# Patient Record
Sex: Male | Born: 1950 | Race: White | Hispanic: No | Smoking: Former smoker
Health system: Southern US, Community
[De-identification: ages and names within clinical notes are randomized; demographics above are authoritative.]

## PROBLEM LIST (undated history)

## (undated) DIAGNOSIS — I10 Essential (primary) hypertension: Secondary | ICD-10-CM

## (undated) DIAGNOSIS — I350 Nonrheumatic aortic (valve) stenosis: Secondary | ICD-10-CM

## (undated) DIAGNOSIS — J449 Chronic obstructive pulmonary disease, unspecified: Secondary | ICD-10-CM

## (undated) DIAGNOSIS — I25810 Atherosclerosis of coronary artery bypass graft(s) without angina pectoris: Secondary | ICD-10-CM

## (undated) DIAGNOSIS — F1010 Alcohol abuse, uncomplicated: Secondary | ICD-10-CM

## (undated) DIAGNOSIS — Z720 Tobacco use: Secondary | ICD-10-CM

## (undated) DIAGNOSIS — J90 Pleural effusion, not elsewhere classified: Secondary | ICD-10-CM

## (undated) DIAGNOSIS — E785 Hyperlipidemia, unspecified: Secondary | ICD-10-CM

## (undated) DIAGNOSIS — I4819 Other persistent atrial fibrillation: Secondary | ICD-10-CM

## (undated) DIAGNOSIS — Z462 Encounter for fitting and adjustment of other devices related to nervous system and special senses: Secondary | ICD-10-CM

## (undated) HISTORY — DX: Nonrheumatic aortic (valve) stenosis: I35.0

## (undated) HISTORY — PX: OTHER SURGICAL HISTORY: SHX169

## (undated) HISTORY — DX: Pleural effusion, not elsewhere classified: J90

## (undated) HISTORY — DX: Other persistent atrial fibrillation: I48.19

## (undated) HISTORY — DX: Chronic obstructive pulmonary disease, unspecified: J44.9

## (undated) HISTORY — DX: Atherosclerosis of coronary artery bypass graft(s) without angina pectoris: I25.810

## (undated) HISTORY — DX: Encounter for fitting and adjustment of other devices related to nervous system and special senses: Z46.2

---

## 2002-08-26 ENCOUNTER — Encounter: Payer: Self-pay | Admitting: Emergency Medicine

## 2002-08-26 ENCOUNTER — Emergency Department (HOSPITAL_COMMUNITY): Admission: EM | Admit: 2002-08-26 | Discharge: 2002-08-26 | Payer: Self-pay | Admitting: Emergency Medicine

## 2004-07-23 ENCOUNTER — Emergency Department (HOSPITAL_COMMUNITY): Admission: EM | Admit: 2004-07-23 | Discharge: 2004-07-23 | Payer: Self-pay | Admitting: Emergency Medicine

## 2009-02-02 ENCOUNTER — Encounter: Admission: RE | Admit: 2009-02-02 | Discharge: 2009-02-02 | Payer: Self-pay | Admitting: Family Medicine

## 2009-02-10 ENCOUNTER — Ambulatory Visit (HOSPITAL_COMMUNITY): Admission: RE | Admit: 2009-02-10 | Discharge: 2009-02-10 | Payer: Self-pay | Admitting: Neurosurgery

## 2009-05-08 ENCOUNTER — Encounter: Admission: RE | Admit: 2009-05-08 | Discharge: 2009-05-08 | Payer: Self-pay | Admitting: Neurosurgery

## 2009-05-28 ENCOUNTER — Observation Stay (HOSPITAL_COMMUNITY): Admission: EM | Admit: 2009-05-28 | Discharge: 2009-05-29 | Payer: Self-pay | Admitting: Emergency Medicine

## 2011-02-13 LAB — HEPATIC FUNCTION PANEL
Alkaline Phosphatase: 71 U/L (ref 39–117)
Indirect Bilirubin: 0.8 mg/dL (ref 0.3–0.9)
Total Protein: 6.4 g/dL (ref 6.0–8.3)

## 2011-02-13 LAB — RAPID URINE DRUG SCREEN, HOSP PERFORMED
Amphetamines: NOT DETECTED
Barbiturates: NOT DETECTED
Opiates: NOT DETECTED

## 2011-02-13 LAB — DIFFERENTIAL
Eosinophils Relative: 1 % (ref 0–5)
Lymphocytes Relative: 33 % (ref 12–46)
Monocytes Absolute: 0.7 10*3/uL (ref 0.1–1.0)
Monocytes Relative: 9 % (ref 3–12)
Neutro Abs: 4.5 10*3/uL (ref 1.7–7.7)

## 2011-02-13 LAB — CBC
HCT: 40.2 % (ref 39.0–52.0)
Hemoglobin: 13.9 g/dL (ref 13.0–17.0)
Hemoglobin: 14.1 g/dL (ref 13.0–17.0)
MCHC: 34.1 g/dL (ref 30.0–36.0)
MCV: 99.4 fL (ref 78.0–100.0)
RBC: 4.09 MIL/uL — ABNORMAL LOW (ref 4.22–5.81)
RDW: 13.3 % (ref 11.5–15.5)

## 2011-02-13 LAB — PROTIME-INR: INR: 1 (ref 0.00–1.49)

## 2011-02-13 LAB — LIPID PANEL
Cholesterol: 198 mg/dL (ref 0–200)
HDL: 40 mg/dL (ref >39)
Triglycerides: 87 mg/dL (ref <150)

## 2011-02-13 LAB — CARDIAC PANEL(CRET KIN+CKTOT+MB+TROPI)
CK, MB: 5.3 ng/mL — ABNORMAL HIGH (ref 0.3–4.0)
Relative Index: 2.2 (ref 0.0–2.5)
Relative Index: 2.3 (ref 0.0–2.5)
Total CK: 217 U/L (ref 7–232)
Total CK: 228 U/L (ref 7–232)
Troponin I: 0.04 ng/mL (ref 0.00–0.06)

## 2011-02-13 LAB — POCT CARDIAC MARKERS
CKMB, poc: 2.3 ng/mL (ref 1.0–8.0)
CKMB, poc: 3.5 ng/mL (ref 1.0–8.0)
Myoglobin, poc: 113 ng/mL (ref 12–200)
Myoglobin, poc: 61.1 ng/mL (ref 12–200)
Myoglobin, poc: 67.2 ng/mL (ref 12–200)
Troponin i, poc: <0.05 ng/mL (ref 0.00–0.09)

## 2011-02-13 LAB — BASIC METABOLIC PANEL
CO2: 27 mEq/L (ref 19–32)
Calcium: 9 mg/dL (ref 8.4–10.5)
GFR calc Af Amer: >60 mL/min (ref >60)
GFR calc non Af Amer: >60 mL/min (ref >60)
Glucose, Bld: 101 mg/dL — ABNORMAL HIGH (ref 70–99)
Potassium: 4 mEq/L (ref 3.5–5.1)
Sodium: 137 mEq/L (ref 135–145)

## 2011-02-13 LAB — HEMOGLOBIN A1C
Hgb A1c MFr Bld: 5.7 % (ref 4.6–6.1)
Mean Plasma Glucose: 117 mg/dL

## 2011-02-13 LAB — APTT: aPTT: 35 seconds (ref 24–37)

## 2011-02-13 LAB — TSH: TSH: 0.986 u[IU]/mL (ref 0.350–4.500)

## 2011-02-16 LAB — CBC
MCHC: 34.2 g/dL (ref 30.0–36.0)
MCV: 98.7 fL (ref 78.0–100.0)
Platelets: 221 10*3/uL (ref 150–400)
RBC: 4.99 MIL/uL (ref 4.22–5.81)
RDW: 13.4 % (ref 11.5–15.5)

## 2011-03-22 NOTE — Op Note (Signed)
NAMELUCHIANO, VISCOMI             ACCOUNT NO.:  [id number]   MEDICAL RECORD NO.:  [id number]          PATIENT TYPE:  OIB   LOCATION:  3524                         FACILITY:  [facility]   PHYSICIAN:  Hilda Lias, M.D.   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   POSTOPERATIVE DIAGNOSIS:  Left L4-5 herniated disk with fragment.   PROCEDURE:  Left L4-5 diskectomy, decompression of the L5 nerve root and  the thecal sac, foraminotomy.  Microscope.   SURGEON:  Hilda Lias, MD   CLINICAL HISTORY:  The patient was admitted because of back and left leg  pain.  The patient has quite a bit of weakness of the left foot.  He has  failed conservative treatment.  MRI showed a large herniated disk going  to the body of L5.  Surgery was advised.   PROCEDURE:  The patient was taken to the OR and after intubation he was  positioned in a prone manner.  The skin was cleaned with DuraPrep.  The  patient was really ischemia and we were able to feel and see the spinous  process.  We made an incision from L4-5 on the left side and retraction  was done.  X-rays showed that indeed we were at the level of the L4-5.  With the drill, we drilled the lower lamina of L5 and the upper of L4.  Then, the yellow ligament was also excised.  With the microscope, we  visualized the thecal sac and indeed there was a large herniated disk  with free fragment going into the body of L5.  Removal of 3 foramen was  done.  There was an opening in the disk and total gross diskectomy on  mediolateral was achieved.  At the end, we had good decompression of the  L5 and S1 nerve root.  Investigation showed no more evidence of any  fragment.  Valsalva maneuver was negative.  Then, fentanyl and Depo-  Medrol were left in the epidural space and the wound was closed  with  Vicryl and Steri-Strips.           ______________________________  Hilda Lias, M.D.     EB/MEDQ  D:  02/10/2009  T:  02/11/2009  Job:  [id number]

## 2011-03-22 NOTE — Discharge Summary (Signed)
Craig [name], Craig [name]             ACCOUNT NO.:  [id number]   MEDICAL RECORD NO.:  [id number]          PATIENT TYPE:  OBV   LOCATION:  4736                         FACILITY:  [facility]   PHYSICIAN:  Charlestine Massed, MDDATE OF BIRTH:  11/23/1950   DATE OF ADMISSION:  05/27/2009  DATE OF DISCHARGE:  05/29/2009                               DISCHARGE SUMMARY   PRIMARY CARE PHYSICIAN:  Tammy R. Collins Scotland, M.D. from [facility] at  [place].   CARDIOLOGIST:  [facility].   REASON FOR ADMISSION:  Chest pain with EKG changes referred by primary  care physician.   DISCHARGE DIAGNOSES:  1. Chest pain possibly noncardiac origin - stress test negative for      reversible ischemia.  2. History of heavy tobacco abuse, currently off.  3. History of alcohol abuse, currently off.  4. He has dyslipidemia.  5. Elevated blood pressure - hypertension.   DISCHARGE MEDICATIONS:  1. Lisinopril 5 mg p.o. daily.  2. Aspirin 81 mg p.o. daily.  3. Zocor 20 mg p.o. at bedtime.   HOSPITAL COURSE BY PROBLEM:  1. Chest pain, rule out near chest pain - Craig [name] is a 60-      year-old gentleman who was referred by his PMD, Dr. Herb Grays,      for complaints of chest pain and he had some EKG changes.  He was      basically referred to get a stress test and evaluation done here.      The patient was admitted, and cyclic cardiac enzymes were rounded,      and total of 3  troponin sets were run, and they were negative for      acute myocardial infarction.  EKG showed very minimal lateral wall      changes as well as coving changes.  The EKG revealed a normal sinus      rhythm with a nonspecific ST-T wave changes in the lateral leads      and T-wave inversions in the inferior leads.  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  His  exercise tolerance has been extremely      good all this time.   The patient has a strong history of smoking, and he is not on any  medication at this time.  His blood pressure was found to be slightly  elevated at the time of admission.   The patient underwent Lexiscan stress test with a nuclear imaging and  has been negative for any reversible inducible ischemia and so the  patient has been cleared to go back home and reported his primary care  physician.   The patient has been started on medication for his hypertension at this  time and to follow up with his PMD for that.  His lipids, LDL has been  elevated and he has been started on Zocor also.  1. Hypertension started on  lisinopril 5 mg p.o. daily.  Follow up with      his PMD for adjustment of medication.  2. Dyslipidemia, Zocor 20 mg p.o. at bedtime.   DISPOSITION:  Discharged back home.   FOLLOWUP:  Follow up with Dr. Herb Grays as an outpatient in a weeks  time for adjustment of blood pressure medications and also for checking  liver function tests after 2-3 weeks of Zocor.   DISCHARGE INSTRUCTIONS:  1. Adhere with medications.  2. Stop smoking and no further alcohol intake.  3. To keep appointments regularly.   A total of 40 minutes was spent on the discharge.      Charlestine Massed, MD  Electronically Signed     UT/MEDQ  D:  05/29/2009  T:  05/30/2009  Job:  [id number]   cc:   Tammy R. Collins Scotland, M.D.

## 2011-03-22 NOTE — H&P (Signed)
[name], [name]             ACCOUNT NO.:  [id number]   MEDICAL RECORD NO.:  [id number]           PATIENT TYPE:   LOCATION:                                 FACILITY:   PHYSICIAN:  Craig Crater, MD         DATE OF BIRTH:  1951/09/24   DATE OF ADMISSION:  05/28/2009  DATE OF DISCHARGE:                              HISTORY & PHYSICAL   PRIMARY CARE PHYSICIAN:  None.   CHIEF COMPLAINT:  Chest pain, shortness of breath.   HISTORY OF PRESENT ILLNESS:  Craig [name] is a 60 year old male with no  significant past medical history.  He has been experiencing chest pain  and shortness of breath which has been going on for the past several  months.  This is approximately of 1 year in duration.  He says that  since the hot weather has begun he has become more short of breath.  There is a decrease in exercise capacity.  The chest pain is located on  the left side of the chest, no radiation.  Five or 10 in intensity.  No  specific aggravating or alleviating factors.  It is associated with  shortness of breath.  No nausea or vomiting.  No cough.  The pain is not  pleuritic.  It does not change with position.  There is no history of  PND or orthopnea.  He had a stress test done 10-12 years ago and is  reportedly normal.  He has a longstanding history of smoking.  He used  to smoke 3 packs per day but note he cut back to 15 cigarettes a day.  The electrocardiogram obtained in the ER was actually different from the  one obtained in April.  There was T-wave inversion in leads II, III, aVF  and V4, V5, V6.  He is currently chest pain free.  The reason he came to  the emergency room was he wanted to get evaluated and so he went to the  [facility] earlier today and they did the electrocardiogram which was  abnormal and so he was sent to the emergency room.   REVIEW OF SYSTEMS:  A complete review of systems was done which included  general, head, eyes, ears, nose, throat, cardiovascular,  respiratory,  GI, GU, endocrine, musculoskeletal, skin, neurological and psychiatric  all are within normal limits other than what is mentioned above.   PAST MEDICAL HISTORY:  Herniated disk, status post surgery in April  2010.   ALLERGIES:  None.   CURRENT MEDICATIONS AT HOME:  None.   SOCIAL HISTORY:  There is a history of tobacco abuse which was heavy in  the past, now he smokes 15 cigarettes per day.  Close to 60-pack-years  of smoking.  Remote history of heavy alcohol abuse, he quit.  No history  of drug use.  He works with heavy machinery.   FAMILY HISTORY:  Noncontributory.   PHYSICAL EXAMINATION:  T max 98.  Blood pressure 119/77 to 150/94.  Pulse rate 58 to 85.  Respirations 18.  Oxygen saturation is 100% on  room air.  GENERAL  APPEARANCE:  Not in acute distress, alert, awake and oriented  x3, afebrile.  HEENT:  Normocephalic, atraumatic, the pupils are equal and reactive to  accommodation, extraocular movements are intact.  The mucous membranes  are moist.  NECK:  Supple, no JVD, lymphadenopathy or carotid bruit.  CVS:  Regular in rhythm, rate is normal, no murmurs, rubs or gallops.  LUNGS:  Clear to auscultation bilaterally.  EXTREMITIES:  No clubbing, cyanosis or edema.  NEUROLOGICAL:  Grossly nonfocal.   LABS/STUDIES:  Basic metabolic profile:  Sodium 137, potassium 4.0,  chloride 106, bicarbonate 27, BUN 12, creatinine 1.03, glucose 101.  Myoglobin 67.2, CK-MB 2.6, troponin less than 0.05.  Electrocardiogram:  Normal sinus rhythm at the rate of 80 beats per minute, axis is normal.  PR interval and QT intervals are within normal limits.  Normal QRS  morphology.  T-wave inversions in lead II, lead III, aVF.  T-wave  inversions in V4, V5, V6.  No ST changes.   ASSESSMENT/PLAN:  1. Chest pain:  Will evaluate for cardiac ischemia given his extensive      smoking history.  He does have new changes in the      electrocardiogram.  Will start him on advanced cardiac  life support      protocol.  Will schedule him for a stress test later in the day.      If the stress test is normal, he will be discharged home.  2. Tobacco abuse:  Extensive counseling was done regarding the      importance of his quitting tobacco.  The patient verbalized      understanding this.  3. Deep vein thrombosis:  Prophylaxis with Lovenox.  4. Fluids, electrolytes and nutrition:  He will be made nothing by      mouth.  He will be on normal saline.  Will replace electrolytes as      needed.  5. Disposition:  The patient will be scheduled for a stress test later      in the day.      Craig Crater, MD  Electronically Signed     TA/MEDQ  D:  05/28/2009  T:  05/28/2009  Job:  [id number]

## 2015-02-17 ENCOUNTER — Encounter: Payer: Self-pay | Admitting: Diagnostic Neuroimaging

## 2015-02-17 ENCOUNTER — Ambulatory Visit (INDEPENDENT_AMBULATORY_CARE_PROVIDER_SITE_OTHER): Payer: 59 | Admitting: Diagnostic Neuroimaging

## 2015-02-17 VITALS — BP 130/89 | HR 86 | Ht 65.0 in | Wt 145.2 lb

## 2015-02-17 DIAGNOSIS — M545 Low back pain: Secondary | ICD-10-CM

## 2015-02-17 DIAGNOSIS — M549 Dorsalgia, unspecified: Secondary | ICD-10-CM | POA: Insufficient documentation

## 2015-02-17 DIAGNOSIS — J441 Chronic obstructive pulmonary disease with (acute) exacerbation: Secondary | ICD-10-CM | POA: Diagnosis not present

## 2015-02-17 DIAGNOSIS — M5126 Other intervertebral disc displacement, lumbar region: Secondary | ICD-10-CM | POA: Diagnosis not present

## 2015-02-17 DIAGNOSIS — M5416 Radiculopathy, lumbar region: Secondary | ICD-10-CM | POA: Diagnosis not present

## 2015-02-17 NOTE — Progress Notes (Signed)
[facility]  PATIENT: Craig [name] DOB: [birth year]  REFERRING CLINICIAN: [facility] HISTORY FROM: patient  REASON FOR VISIT: new consult    HISTORICAL  CHIEF COMPLAINT:  Chief Complaint  Patient presents with  . New Evaluation    HISTORY OF PRESENT ILLNESS:   64 year old male here for evaluation of low back pain. Symptoms of been present for several months. Increasing severity in the last 1 month. Patient referred for consultation by [facility] PA-c.  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 Patient has had intermittent low back pain symptoms radiating to the left leg since that time. He noticed some symptoms 2 years ago which were intermittent. Patient retired 1-2 months ago due to lung reasons. He has not been doing any heavy lifting. No injuries. Over the past 2-3 months he's noticed increasing pain, intermittently radiate into the left groin. Last one month he is noted more constant pain into his left groin, left testicle region. He's able to have an erection, but not able to maintain during sexual activity. This is a new problem since the past 4-6 weeks. No incontinence. No numbness in the left leg. No problems with the right leg. He feels some pain in the right lower back region which seems to radiate to the left side. No problems with his arms, neck or face.  He has tried ibuprofen and tramadol without relief. He does not like to take pain medication.   REVIEW OF SYSTEMS: Full 14 system review of systems performed and notable only for shortness of breath ringing in ears.  ALLERGIES: Not on File  HOME MEDICATIONS: No outpatient prescriptions prior to visit.   No facility-administered medications prior to visit.    PAST MEDICAL HISTORY: Past Medical History  Diagnosis Date  . Emphysema lung   . COPD (chronic obstructive pulmonary disease)     PAST SURGICAL  HISTORY: Past Surgical History  Procedure Laterality Date  . Ruptured disc repair  2009 or 2010    FAMILY HISTORY: Family History  Problem Relation Age of Onset  . Diabetes Father     SOCIAL HISTORY:  History   Social History  . Marital Status: Single    Spouse Name: N/A  . Number of Children: 3  . Years of Education: GED   Occupational History  . retired     Social History Main Topics  . Smoking status: Former Research scientist (life sciences)  . Smokeless tobacco: Never Used     Comment: quit 2010  . Alcohol Use: No     Comment: quit 2004  . Drug Use: No     Comment: quit 2015  . Sexual Activity: Not on file   Other Topics Concern  . Not on file   Social History Narrative   Lives with son   Drinks one cup of coffee a day and one soda q2-3 days     PHYSICAL EXAM  Filed Vitals:   02/17/15 1137  BP: 130/89  Pulse: 86  Height: '5\' 5"'  (1.651 m)  Weight: 145 lb 3.2 oz (65.862 kg)    Body mass index is 24.16 kg/(m^2).   Visual Acuity Screening   Right eye Left eye Both eyes  Without correction: 20/40 20/30   With correction:       No flowsheet data found.  GENERAL EXAM: Patient is in no distress; well developed, nourished and groomed; neck is supple  CARDIOVASCULAR: Regular rate and rhythm, no  murmurs, no carotid bruits  NEUROLOGIC: MENTAL STATUS: awake, alert, oriented to person, place and time, recent and remote memory intact, normal attention and concentration, language fluent, comprehension intact, naming intact, fund of knowledge appropriate CRANIAL NERVE: no papilledema on fundoscopic exam, pupils equal and reactive to light, visual fields full to confrontation, extraocular muscles intact, no nystagmus, facial sensation and strength symmetric, hearing intact, palate elevates symmetrically, uvula midline, shoulder shrug symmetric, tongue midline. MOTOR: normal bulk and tone, full strength in the BUE, BLE; LEFT KF AND KF TRIGGERES LOW BACK PAIN ON THE RIGHT SENSORY:  normal and symmetric to light touch, pinprick, temperature, vibration COORDINATION: finger-nose-finger, fine finger movements normal REFLEXES: deep tendon reflexes present and symmetric; TRACE AT ANKLES GAIT/STATION: narrow based gait; able to walk on toes, heels and tandem; romberg is negative    DIAGNOSTIC DATA (LABS, IMAGING, TESTING) - I reviewed patient records, labs, notes, testing and imaging myself where available.  Lab Results  Component Value Date   WBC 8.3 05/29/2009   HGB 14.1 05/29/2009   HCT 41.2 05/29/2009   MCV 99.4 05/29/2009   PLT 163 05/29/2009      Component Value Date/Time   NA 137 05/27/2009 2134   K 4.0 05/27/2009 2134   CL 106 05/27/2009 2134   CO2 27 05/27/2009 2134   GLUCOSE 101* 05/27/2009 2134   BUN 12 05/27/2009 2134   CREATININE 1.03 05/27/2009 2134   CALCIUM 9.0 05/27/2009 2134   PROT 6.4 05/28/2009 1425   ALBUMIN 3.7 05/28/2009 1425   AST 22 05/28/2009 1425   ALT 16 05/28/2009 1425   ALKPHOS 71 05/28/2009 1425   BILITOT 0.9 05/28/2009 1425   GFRNONAA >60 05/27/2009 2134   GFRAA  05/27/2009 2134    >60        The eGFR has been calculated using the MDRD equation. This calculation has not been validated in all clinical situations. eGFR's persistently <60 mL/min signify possible Chronic Kidney Disease.   Lab Results  Component Value Date   CHOL  05/28/2009    198        ATP III CLASSIFICATION:  <200     mg/dL   Desirable  200-239  mg/dL   Borderline High  >=240    mg/dL   High          HDL 40 05/28/2009   LDLCALC * 05/28/2009    141        Total Cholesterol/HDL:CHD Risk Coronary Heart Disease Risk Table                     Men   Women  1/2 Average Risk   3.4   3.3  Average Risk       5.0   4.4  2 X Average Risk   9.6   7.1  3 X Average Risk  23.4   11.0        Use the calculated Patient Ratio above and the CHD Risk Table to determine the patient's CHD Risk.        ATP III CLASSIFICATION (LDL):  <100     mg/dL    Optimal  100-129  mg/dL   Near or Above                    Optimal  130-159  mg/dL   Borderline  160-189  mg/dL   High  >190     mg/dL   Very High   TRIG 87 05/28/2009  CHOLHDL 5.0 05/28/2009   Lab Results  Component Value Date   HGBA1C  05/28/2009    5.7 (NOTE) The ADA recommends the following therapeutic goal for glycemic control related to Hgb A1c measurement: Goal of therapy: <6.5 Hgb A1c  Reference: American Diabetes Association: Clinical Practice Recommendations 2010, Diabetes Care, 2010, 33: (Suppl  1).   No results found for: VITAMINB12 Lab Results  Component Value Date   TSH 0.986 *Test methodology is 3rd generation TSH* 05/28/2009    02/03/09 MRI lumbar spine [I reviewed images myself and agree with interpretation. -VRP]  1. Left paracentral disc extrusion with caudal migration of a moderate-sized disc fragment at L4-L5. There is resulting left L5 nerve root compression. 2. Mild disc bulging and bilateral facet hypertrophy at L5-S1 without resulting spinal stenosis or nerve root encroachment. 3. No other significant disc space findings.    ASSESSMENT AND PLAN  64 y.o. year old male here with history of left L4-5 disc herniation, status post discectomy in 2010, now with recurrent low back pain radiating to left groin and testicle, with erectile dysfunction, pain, intermittent for past 2-3 months, more severe and constant in the past 4-6 weeks. Suspect recurrent disc herniation.  PLAN: - MRI lumbar spine - then PT eval vs neurosurgery evaluation  Orders Placed This Encounter  Procedures  . MR Lumbar Spine Wo Contrast   Return in about 6 weeks (around 03/31/2015).    Penni Bombard, MD 2/64/1583, 09:40 PM Certified in Neurology, Neurophysiology and Neuroimaging  [facility] [address], [place], [place] [address] [phone]

## 2015-02-19 ENCOUNTER — Other Ambulatory Visit: Payer: Self-pay | Admitting: Diagnostic Neuroimaging

## 2015-02-19 DIAGNOSIS — M5416 Radiculopathy, lumbar region: Secondary | ICD-10-CM

## 2015-02-25 ENCOUNTER — Ambulatory Visit (INDEPENDENT_AMBULATORY_CARE_PROVIDER_SITE_OTHER): Payer: 59

## 2015-02-25 DIAGNOSIS — M5416 Radiculopathy, lumbar region: Secondary | ICD-10-CM | POA: Diagnosis not present

## 2015-02-25 MED ORDER — GADOPENTETATE DIMEGLUMINE 469.01 MG/ML IV SOLN: 15.0000 mL | Freq: Once | INTRAVENOUS | Status: AC | PRN

## 2015-03-31 ENCOUNTER — Ambulatory Visit (INDEPENDENT_AMBULATORY_CARE_PROVIDER_SITE_OTHER): Payer: 59 | Admitting: Diagnostic Neuroimaging

## 2015-03-31 ENCOUNTER — Encounter: Payer: Self-pay | Admitting: Diagnostic Neuroimaging

## 2015-03-31 DIAGNOSIS — M5416 Radiculopathy, lumbar region: Secondary | ICD-10-CM

## 2015-03-31 NOTE — Progress Notes (Signed)
[facility]  PATIENT: Craig [name] DOB: May 24, 1951  REFERRING CLINICIAN: [place] HISTORY FROM: patient  REASON FOR VISIT: follow up    HISTORICAL  CHIEF COMPLAINT:  Chief Complaint  Patient presents with  . Follow-up    lumbar radiculopathy    HISTORY OF PRESENT ILLNESS:   UPDATE 03/31/15: Since last visit sxs are stable. MRI results reviewed. He is reluctant to pursue surgical eval of pain mgmt / meds. Interested in PT evaluation.  PRIOR HPI (02/17/15): 64 year old male here for evaluation of low back pain. Symptoms of been present for several months. Increasing severity in the last 1 month. Patient referred for consultation by [facility] PA-c. 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 Patient has had intermittent low back pain symptoms radiating to the left leg since that time. He noticed some symptoms 2 years ago which were intermittent. Patient retired 1-2 months ago due to lung reasons. He has not been doing any heavy lifting. No injuries. Over the past 2-3 months he's noticed increasing pain, intermittently radiate into the left groin. Last one month he is noted more constant pain into his left groin, left testicle region. He's able to have an erection, but not able to maintain during sexual activity. This is a new problem since the past 4-6 weeks. No incontinence. No numbness in the left leg. No problems with the right leg. He feels some pain in the right lower back region which seems to radiate to the left side. No problems with his arms, neck or face. He has tried ibuprofen and tramadol without relief. He does not like to take pain medication.   REVIEW OF SYSTEMS: Full 14 system review of systems performed and notable only for shortness of breath back pain.   ALLERGIES: Not on File  HOME MEDICATIONS: Outpatient Prescriptions Prior to Visit  Medication Sig  Dispense Refill  . traMADol (ULTRAM) 50 MG tablet Take 50 mg by mouth every 6 (six) hours.  3   No facility-administered medications prior to visit.    PAST MEDICAL HISTORY: Past Medical History  Diagnosis Date  . Emphysema lung   . COPD (chronic obstructive pulmonary disease)     PAST SURGICAL HISTORY: Past Surgical History  Procedure Laterality Date  . Ruptured disc repair  2009 or 2010    FAMILY HISTORY: Family History  Problem Relation Age of Onset  . Diabetes Father     SOCIAL HISTORY:  History   Social History  . Marital Status: Single    Spouse Name: N/A  . Number of Children: 3  . Years of Education: GED   Occupational History  . retired     Social History Main Topics  . Smoking status: Former Research scientist (life sciences)  . Smokeless tobacco: Never Used     Comment: quit 2010  . Alcohol Use: No     Comment: quit 2004  . Drug Use: No     Comment: quit 2015  . Sexual Activity: Not on file   Other Topics Concern  . Not on file   Social History Narrative   Lives with son   Drinks one cup of coffee a day and one soda q2-3 days     PHYSICAL EXAM  Filed Vitals:   03/31/15 1504  BP: 157/98  Pulse: 67  Height: _0  (1.651 m)  Weight: 147 lb 9.6 oz (66.951 kg)    Body mass index is 24.56 kg/(m^2).  No exam data present  No flowsheet data found.  GENERAL EXAM: Patient is in no distress; well developed, nourished and groomed; neck is supple  CARDIOVASCULAR: Regular rate and rhythm, no murmurs, no carotid bruits  NEUROLOGIC: MENTAL STATUS: awake, alert, language fluent, comprehension intact, naming intact, fund of knowledge appropriate CRANIAL NERVE: no papilledema on fundoscopic exam, pupils equal and reactive to light, visual fields full to confrontation, extraocular muscles intact, no nystagmus, facial sensation and strength symmetric, hearing intact, palate elevates symmetrically, uvula midline, shoulder shrug symmetric, tongue midline. MOTOR: normal bulk  and tone, full strength in the BUE, BLE SENSORY: normal and symmetric to light touch COORDINATION: finger-nose-finger, fine finger movements normal REFLEXES: deep tendon reflexes present and symmetric; TRACE AT ANKLES GAIT/STATION: narrow based gait; able to walk tandem; romberg is negative     DIAGNOSTIC DATA (LABS, IMAGING, TESTING) - I reviewed patient records, labs, notes, testing and imaging myself where available.  Lab Results  Component Value Date   WBC 8.3 05/29/2009   HGB 14.1 05/29/2009   HCT 41.2 05/29/2009   MCV 99.4 05/29/2009   PLT 163 05/29/2009      Component Value Date/Time   NA 137 05/27/2009 2134   K 4.0 05/27/2009 2134   CL 106 05/27/2009 2134   CO2 27 05/27/2009 2134   GLUCOSE 101* 05/27/2009 2134   BUN 12 05/27/2009 2134   CREATININE 1.03 05/27/2009 2134   CALCIUM 9.0 05/27/2009 2134   PROT 6.4 05/28/2009 1425   ALBUMIN 3.7 05/28/2009 1425   AST 22 05/28/2009 1425   ALT 16 05/28/2009 1425   ALKPHOS 71 05/28/2009 1425   BILITOT 0.9 05/28/2009 1425   GFRNONAA >60 05/27/2009 2134   GFRAA  05/27/2009 2134    >60        The eGFR has been calculated using the MDRD equation. This calculation has not been validated in all clinical situations. eGFR's persistently <60 mL/min signify possible Chronic Kidney Disease.   Lab Results  Component Value Date   CHOL  05/28/2009    198        ATP III CLASSIFICATION:  <200     mg/dL   Desirable  200-239  mg/dL   Borderline High  >=240    mg/dL   High          HDL 40 05/28/2009   LDLCALC * 05/28/2009    141        Total Cholesterol/HDL:CHD Risk Coronary Heart Disease Risk Table                     Men   Women  1/2 Average Risk   3.4   3.3  Average Risk       5.0   4.4  2 X Average Risk   9.6   7.1  3 X Average Risk  23.4   11.0        Use the calculated Patient Ratio above and the CHD Risk Table to determine the patient's CHD Risk.        ATP III CLASSIFICATION (LDL):  <100     mg/dL    Optimal  100-129  mg/dL   Near or Above                    Optimal  130-159  mg/dL   Borderline  160-189  mg/dL   High  >190     mg/dL   Very High   TRIG 87 05/28/2009  CHOLHDL 5.0 05/28/2009   Lab Results  Component Value Date   HGBA1C  05/28/2009    5.7 (NOTE) The ADA recommends the following therapeutic goal for glycemic control related to Hgb A1c measurement: Goal of therapy: <6.5 Hgb A1c  Reference: American Diabetes Association: Clinical Practice Recommendations 2010, Diabetes Care, 2010, 33: (Suppl  1).   No results found for: VITAMINB12 Lab Results  Component Value Date   TSH 0.986 *Test methodology is 3rd generation TSH* 05/28/2009    02/03/09 MRI lumbar spine [I reviewed images myself and agree with interpretation. -VRP]  1. Left paracentral disc extrusion with caudal migration of a moderate-sized disc fragment at L4-L5. There is resulting left L5 nerve root compression. 2. Mild disc bulging and bilateral facet hypertrophy at L5-S1 without resulting spinal stenosis or nerve root encroachment. 3. No other significant disc space findings.  02/25/15 MRI lumbar spine [I reviewed images myself and agree with interpretation. -VRP]  - showing a prior left L4-L5 laminectomy. Disc height ery reduced at this height and there is left greater than right foraminal narrowing with some encroachment upon the exiting left L4 nerve root. Additionally there is left lateral recess stenosis that encroaches upon the left L5 nerve root. That nerve root is slightly displaced posteriorly. After contrast, there is subtle enhancement of the residual disc posteriorly but no definite epidural fibrosis.     ASSESSMENT AND PLAN  64 y.o. year old male here with history of left L4-5 disc herniation, status post discectomy in 2010, now with recurrent low back pain radiating to left groin and testicle, with erectile dysfunction, pain, intermittent since Jan 2016. Could represent lumbar radiculopathy,  although repeat MRI does not demonstrate a specific lesion amenable to surgery. Will try conservative mgmt with PT evaluation. Also, he may need a urology eval for genital pain and erectile dysfunction.   PLAN: - PT eval  - return to PCP for possible urology consult  Orders Placed This Encounter  Procedures  . Ambulatory referral to Physical Therapy   Return in about 6 months (around 10/01/2015).    Penni Bombard, MD 5/97/4718, 5:50 PM Certified in Neurology, Neurophysiology and Neuroimaging  [facility] [address] [place], [place] [address] [phone]

## 2015-03-31 NOTE — Patient Instructions (Signed)
I will setup physical therapy.  Consider urology evaluation for genital pain and erectile dysfunction.

## 2015-10-07 ENCOUNTER — Ambulatory Visit: Payer: 59 | Admitting: Diagnostic Neuroimaging

## 2015-12-26 ENCOUNTER — Encounter (HOSPITAL_COMMUNITY): Payer: Self-pay | Admitting: *Deleted

## 2015-12-26 ENCOUNTER — Emergency Department (HOSPITAL_COMMUNITY): Payer: Medicaid Other

## 2015-12-26 ENCOUNTER — Inpatient Hospital Stay (HOSPITAL_COMMUNITY)
Admission: EM | Admit: 2015-12-26 | Discharge: 2016-02-03 | DRG: 216 | Disposition: A | Discharge status: Rehabilitation Facility | Payer: Medicaid Other | Attending: Surgery | Admitting: Surgery

## 2015-12-26 DIAGNOSIS — Z9689 Presence of other specified functional implants: Secondary | ICD-10-CM

## 2015-12-26 DIAGNOSIS — N189 Chronic kidney disease, unspecified: Secondary | ICD-10-CM | POA: Diagnosis present

## 2015-12-26 DIAGNOSIS — I471 Supraventricular tachycardia: Secondary | ICD-10-CM | POA: Diagnosis present

## 2015-12-26 DIAGNOSIS — D696 Thrombocytopenia, unspecified: Secondary | ICD-10-CM | POA: Diagnosis present

## 2015-12-26 DIAGNOSIS — J029 Acute pharyngitis, unspecified: Secondary | ICD-10-CM | POA: Diagnosis present

## 2015-12-26 DIAGNOSIS — J9811 Atelectasis: Secondary | ICD-10-CM | POA: Diagnosis not present

## 2015-12-26 DIAGNOSIS — I5043 Acute on chronic combined systolic (congestive) and diastolic (congestive) heart failure: Secondary | ICD-10-CM | POA: Diagnosis not present

## 2015-12-26 DIAGNOSIS — E871 Hypo-osmolality and hyponatremia: Secondary | ICD-10-CM | POA: Diagnosis present

## 2015-12-26 DIAGNOSIS — G9341 Metabolic encephalopathy: Secondary | ICD-10-CM | POA: Diagnosis present

## 2015-12-26 DIAGNOSIS — D649 Anemia, unspecified: Secondary | ICD-10-CM | POA: Diagnosis present

## 2015-12-26 DIAGNOSIS — K819 Cholecystitis, unspecified: Secondary | ICD-10-CM

## 2015-12-26 DIAGNOSIS — I25110 Atherosclerotic heart disease of native coronary artery with unstable angina pectoris: Secondary | ICD-10-CM | POA: Diagnosis present

## 2015-12-26 DIAGNOSIS — I313 Pericardial effusion (noninflammatory): Secondary | ICD-10-CM | POA: Diagnosis present

## 2015-12-26 DIAGNOSIS — I255 Ischemic cardiomyopathy: Secondary | ICD-10-CM | POA: Diagnosis present

## 2015-12-26 DIAGNOSIS — J189 Pneumonia, unspecified organism: Secondary | ICD-10-CM | POA: Diagnosis not present

## 2015-12-26 DIAGNOSIS — I481 Persistent atrial fibrillation: Secondary | ICD-10-CM | POA: Diagnosis present

## 2015-12-26 DIAGNOSIS — R0602 Shortness of breath: Secondary | ICD-10-CM

## 2015-12-26 DIAGNOSIS — D6489 Other specified anemias: Secondary | ICD-10-CM | POA: Diagnosis present

## 2015-12-26 DIAGNOSIS — J9622 Acute and chronic respiratory failure with hypercapnia: Secondary | ICD-10-CM | POA: Diagnosis not present

## 2015-12-26 DIAGNOSIS — R739 Hyperglycemia, unspecified: Secondary | ICD-10-CM | POA: Diagnosis not present

## 2015-12-26 DIAGNOSIS — I272 Other secondary pulmonary hypertension: Secondary | ICD-10-CM | POA: Diagnosis present

## 2015-12-26 DIAGNOSIS — J90 Pleural effusion, not elsewhere classified: Secondary | ICD-10-CM

## 2015-12-26 DIAGNOSIS — T45515A Adverse effect of anticoagulants, initial encounter: Secondary | ICD-10-CM | POA: Diagnosis not present

## 2015-12-26 DIAGNOSIS — I7121 Aneurysm of the ascending aorta, without rupture: Secondary | ICD-10-CM | POA: Insufficient documentation

## 2015-12-26 DIAGNOSIS — I214 Non-ST elevation (NSTEMI) myocardial infarction: Secondary | ICD-10-CM | POA: Diagnosis present

## 2015-12-26 DIAGNOSIS — I110 Hypertensive heart disease with heart failure: Secondary | ICD-10-CM | POA: Diagnosis present

## 2015-12-26 DIAGNOSIS — Z951 Presence of aortocoronary bypass graft: Secondary | ICD-10-CM

## 2015-12-26 DIAGNOSIS — K7200 Acute and subacute hepatic failure without coma: Secondary | ICD-10-CM | POA: Diagnosis not present

## 2015-12-26 DIAGNOSIS — K5790 Diverticulosis of intestine, part unspecified, without perforation or abscess without bleeding: Secondary | ICD-10-CM | POA: Diagnosis present

## 2015-12-26 DIAGNOSIS — Z7982 Long term (current) use of aspirin: Secondary | ICD-10-CM | POA: Diagnosis not present

## 2015-12-26 DIAGNOSIS — J939 Pneumothorax, unspecified: Secondary | ICD-10-CM

## 2015-12-26 DIAGNOSIS — Z978 Presence of other specified devices: Secondary | ICD-10-CM

## 2015-12-26 DIAGNOSIS — I248 Other forms of acute ischemic heart disease: Secondary | ICD-10-CM | POA: Diagnosis present

## 2015-12-26 DIAGNOSIS — R0902 Hypoxemia: Secondary | ICD-10-CM | POA: Diagnosis present

## 2015-12-26 DIAGNOSIS — J440 Chronic obstructive pulmonary disease with acute lower respiratory infection: Secondary | ICD-10-CM | POA: Diagnosis not present

## 2015-12-26 DIAGNOSIS — I130 Hypertensive heart and chronic kidney disease with heart failure and stage 1 through stage 4 chronic kidney disease, or unspecified chronic kidney disease: Secondary | ICD-10-CM | POA: Diagnosis present

## 2015-12-26 DIAGNOSIS — I959 Hypotension, unspecified: Secondary | ICD-10-CM | POA: Diagnosis present

## 2015-12-26 DIAGNOSIS — I4892 Unspecified atrial flutter: Secondary | ICD-10-CM | POA: Diagnosis present

## 2015-12-26 DIAGNOSIS — Z952 Presence of prosthetic heart valve: Secondary | ICD-10-CM

## 2015-12-26 DIAGNOSIS — D62 Acute posthemorrhagic anemia: Secondary | ICD-10-CM | POA: Diagnosis not present

## 2015-12-26 DIAGNOSIS — E872 Acidosis: Secondary | ICD-10-CM | POA: Diagnosis present

## 2015-12-26 DIAGNOSIS — J9690 Respiratory failure, unspecified, unspecified whether with hypoxia or hypercapnia: Secondary | ICD-10-CM

## 2015-12-26 DIAGNOSIS — J9621 Acute and chronic respiratory failure with hypoxia: Secondary | ICD-10-CM | POA: Diagnosis not present

## 2015-12-26 DIAGNOSIS — E875 Hyperkalemia: Secondary | ICD-10-CM | POA: Diagnosis present

## 2015-12-26 DIAGNOSIS — R079 Chest pain, unspecified: Secondary | ICD-10-CM

## 2015-12-26 DIAGNOSIS — I252 Old myocardial infarction: Secondary | ICD-10-CM | POA: Diagnosis not present

## 2015-12-26 DIAGNOSIS — R740 Nonspecific elevation of levels of transaminase and lactic acid dehydrogenase [LDH]: Secondary | ICD-10-CM | POA: Diagnosis present

## 2015-12-26 DIAGNOSIS — J441 Chronic obstructive pulmonary disease with (acute) exacerbation: Secondary | ICD-10-CM | POA: Diagnosis not present

## 2015-12-26 DIAGNOSIS — R6521 Severe sepsis with septic shock: Secondary | ICD-10-CM | POA: Diagnosis not present

## 2015-12-26 DIAGNOSIS — A419 Sepsis, unspecified organism: Secondary | ICD-10-CM | POA: Diagnosis not present

## 2015-12-26 DIAGNOSIS — N179 Acute kidney failure, unspecified: Secondary | ICD-10-CM | POA: Insufficient documentation

## 2015-12-26 DIAGNOSIS — I6522 Occlusion and stenosis of left carotid artery: Secondary | ICD-10-CM | POA: Diagnosis present

## 2015-12-26 DIAGNOSIS — B3749 Other urogenital candidiasis: Secondary | ICD-10-CM | POA: Diagnosis not present

## 2015-12-26 DIAGNOSIS — I480 Paroxysmal atrial fibrillation: Secondary | ICD-10-CM | POA: Insufficient documentation

## 2015-12-26 DIAGNOSIS — I9789 Other postprocedural complications and disorders of the circulatory system, not elsewhere classified: Secondary | ICD-10-CM | POA: Diagnosis not present

## 2015-12-26 DIAGNOSIS — Z01818 Encounter for other preprocedural examination: Secondary | ICD-10-CM

## 2015-12-26 DIAGNOSIS — I712 Thoracic aortic aneurysm, without rupture: Secondary | ICD-10-CM | POA: Diagnosis present

## 2015-12-26 DIAGNOSIS — E46 Unspecified protein-calorie malnutrition: Secondary | ICD-10-CM | POA: Diagnosis present

## 2015-12-26 DIAGNOSIS — T814XXA Infection following a procedure, initial encounter: Secondary | ICD-10-CM | POA: Diagnosis not present

## 2015-12-26 DIAGNOSIS — I2510 Atherosclerotic heart disease of native coronary artery without angina pectoris: Secondary | ICD-10-CM | POA: Insufficient documentation

## 2015-12-26 DIAGNOSIS — J9602 Acute respiratory failure with hypercapnia: Secondary | ICD-10-CM

## 2015-12-26 DIAGNOSIS — Z8639 Personal history of other endocrine, nutritional and metabolic disease: Secondary | ICD-10-CM

## 2015-12-26 DIAGNOSIS — J9600 Acute respiratory failure, unspecified whether with hypoxia or hypercapnia: Secondary | ICD-10-CM | POA: Insufficient documentation

## 2015-12-26 DIAGNOSIS — T829XXA Unspecified complication of cardiac and vascular prosthetic device, implant and graft, initial encounter: Secondary | ICD-10-CM

## 2015-12-26 DIAGNOSIS — E785 Hyperlipidemia, unspecified: Secondary | ICD-10-CM | POA: Diagnosis present

## 2015-12-26 DIAGNOSIS — J9601 Acute respiratory failure with hypoxia: Secondary | ICD-10-CM

## 2015-12-26 DIAGNOSIS — I350 Nonrheumatic aortic (valve) stenosis: Secondary | ICD-10-CM | POA: Insufficient documentation

## 2015-12-26 DIAGNOSIS — E1165 Type 2 diabetes mellitus with hyperglycemia: Secondary | ICD-10-CM | POA: Diagnosis present

## 2015-12-26 DIAGNOSIS — E874 Mixed disorder of acid-base balance: Secondary | ICD-10-CM | POA: Diagnosis not present

## 2015-12-26 DIAGNOSIS — Z8679 Personal history of other diseases of the circulatory system: Secondary | ICD-10-CM

## 2015-12-26 DIAGNOSIS — J449 Chronic obstructive pulmonary disease, unspecified: Secondary | ICD-10-CM | POA: Diagnosis present

## 2015-12-26 DIAGNOSIS — J9801 Acute bronchospasm: Secondary | ICD-10-CM | POA: Diagnosis present

## 2015-12-26 DIAGNOSIS — Z7901 Long term (current) use of anticoagulants: Secondary | ICD-10-CM

## 2015-12-26 DIAGNOSIS — E1122 Type 2 diabetes mellitus with diabetic chronic kidney disease: Secondary | ICD-10-CM | POA: Diagnosis present

## 2015-12-26 DIAGNOSIS — R7989 Other specified abnormal findings of blood chemistry: Secondary | ICD-10-CM

## 2015-12-26 DIAGNOSIS — N170 Acute kidney failure with tubular necrosis: Secondary | ICD-10-CM | POA: Diagnosis not present

## 2015-12-26 DIAGNOSIS — D6832 Hemorrhagic disorder due to extrinsic circulating anticoagulants: Secondary | ICD-10-CM | POA: Diagnosis not present

## 2015-12-26 DIAGNOSIS — Z87891 Personal history of nicotine dependence: Secondary | ICD-10-CM

## 2015-12-26 DIAGNOSIS — E8770 Fluid overload, unspecified: Secondary | ICD-10-CM | POA: Diagnosis present

## 2015-12-26 DIAGNOSIS — R778 Other specified abnormalities of plasma proteins: Secondary | ICD-10-CM

## 2015-12-26 DIAGNOSIS — R0600 Dyspnea, unspecified: Secondary | ICD-10-CM

## 2015-12-26 DIAGNOSIS — Z9889 Other specified postprocedural states: Secondary | ICD-10-CM

## 2015-12-26 DIAGNOSIS — Z4659 Encounter for fitting and adjustment of other gastrointestinal appliance and device: Secondary | ICD-10-CM

## 2015-12-26 HISTORY — DX: Tobacco use: Z72.0

## 2015-12-26 HISTORY — DX: Hyperlipidemia, unspecified: E78.5

## 2015-12-26 HISTORY — DX: Alcohol abuse, uncomplicated: F10.10

## 2015-12-26 HISTORY — DX: Essential (primary) hypertension: I10

## 2015-12-26 LAB — I-STAT CG4 LACTIC ACID, ED
LACTIC ACID, VENOUS: 0.88 mmol/L (ref 0.5–2.0)
LACTIC ACID, VENOUS: 0.88 mmol/L (ref 0.5–2.0)

## 2015-12-26 LAB — APTT: APTT: 32 s (ref 24–37)

## 2015-12-26 LAB — CBC
HCT: 40.8 % (ref 39.0–52.0)
Hemoglobin: 13.6 g/dL (ref 13.0–17.0)
MCH: 32.3 pg (ref 26.0–34.0)
MCHC: 33.3 g/dL (ref 30.0–36.0)
MCV: 96.9 fL (ref 78.0–100.0)
PLATELETS: 272 10*3/uL (ref 150–400)
RBC: 4.21 MIL/uL — ABNORMAL LOW (ref 4.22–5.81)
RDW: 12.8 % (ref 11.5–15.5)
WBC: 9.3 10*3/uL (ref 4.0–10.5)

## 2015-12-26 LAB — PROTIME-INR
INR: 1.16 (ref 0.00–1.49)
PROTHROMBIN TIME: 15 s (ref 11.6–15.2)

## 2015-12-26 LAB — BASIC METABOLIC PANEL
ANION GAP: 12 (ref 5–15)
BUN: 15 mg/dL (ref 6–20)
CHLORIDE: 106 mmol/L (ref 101–111)
CO2: 22 mmol/L (ref 22–32)
Calcium: 9.5 mg/dL (ref 8.9–10.3)
Creatinine, Ser: 0.96 mg/dL (ref 0.61–1.24)
GFR calc Af Amer: >60 mL/min (ref >60)
GFR calc non Af Amer: >60 mL/min (ref >60)
GLUCOSE: 115 mg/dL — AB (ref 65–99)
Potassium: 4.5 mmol/L (ref 3.5–5.1)
Sodium: 140 mmol/L (ref 135–145)

## 2015-12-26 LAB — I-STAT TROPONIN, ED: Troponin i, poc: 0.91 ng/mL (ref 0.00–0.08)

## 2015-12-26 LAB — TROPONIN I: Troponin I: 2.32 ng/mL (ref <0.031)

## 2015-12-26 MED ORDER — DEXTROSE 5 % IV SOLN
500.0000 mg | INTRAVENOUS | Status: DC
  Administered 2015-12-27 – 2015-12-29 (×3): 500 mg via INTRAVENOUS
  Filled 2015-12-26 (×5): qty 500

## 2015-12-26 MED ORDER — DEXTROSE 5 % IV SOLN
500.0000 mg | Freq: Once | INTRAVENOUS | Status: AC
  Administered 2015-12-26: 500 mg via INTRAVENOUS
  Filled 2015-12-26: qty 500

## 2015-12-26 MED ORDER — OSELTAMIVIR PHOSPHATE 75 MG PO CAPS
75.0000 mg | ORAL_CAPSULE | Freq: Once | ORAL | Status: AC
  Administered 2015-12-26: 75 mg via ORAL
  Filled 2015-12-26: qty 1

## 2015-12-26 MED ORDER — HEPARIN BOLUS VIA INFUSION
4000.0000 [IU] | Freq: Once | INTRAVENOUS | Status: AC
  Administered 2015-12-26: 4000 [IU] via INTRAVENOUS
  Filled 2015-12-26: qty 4000

## 2015-12-26 MED ORDER — MORPHINE SULFATE (PF) 4 MG/ML IV SOLN
4.0000 mg | Freq: Once | INTRAVENOUS | Status: AC
  Administered 2015-12-26: 4 mg via INTRAVENOUS
  Filled 2015-12-26: qty 1

## 2015-12-26 MED ORDER — CEFTRIAXONE SODIUM 1 G IJ SOLR
1.0000 g | Freq: Once | INTRAMUSCULAR | Status: AC
  Administered 2015-12-26: 1 g via INTRAVENOUS
  Filled 2015-12-26: qty 10

## 2015-12-26 MED ORDER — DEXTROSE 5 % IV SOLN
1.0000 g | INTRAVENOUS | Status: AC
  Administered 2015-12-27 – 2016-01-01 (×6): 1 g via INTRAVENOUS
  Filled 2015-12-26 (×7): qty 10

## 2015-12-26 MED ORDER — ASPIRIN 325 MG PO TABS
325.0000 mg | ORAL_TABLET | Freq: Every day | ORAL | Status: DC
  Administered 2015-12-27: 325 mg via ORAL
  Filled 2015-12-26: qty 1

## 2015-12-26 MED ORDER — ASPIRIN 325 MG PO TABS
325.0000 mg | ORAL_TABLET | Freq: Once | ORAL | Status: AC
  Administered 2015-12-26: 325 mg via ORAL
  Filled 2015-12-26: qty 1

## 2015-12-26 MED ORDER — IOHEXOL 350 MG/ML SOLN
100.0000 mL | Freq: Once | INTRAVENOUS | Status: AC | PRN
  Administered 2015-12-26: 80 mL via INTRAVENOUS

## 2015-12-26 MED ORDER — NITROGLYCERIN IN D5W 200-5 MCG/ML-% IV SOLN
5.0000 ug/min | INTRAVENOUS | Status: DC
  Administered 2015-12-26: 5 ug/min via INTRAVENOUS
  Filled 2015-12-26: qty 250

## 2015-12-26 MED ORDER — HEPARIN (PORCINE) IN NACL 100-0.45 UNIT/ML-% IJ SOLN
1000.0000 [IU]/h | INTRAMUSCULAR | Status: DC
  Administered 2015-12-26: 800 [IU]/h via INTRAVENOUS
  Filled 2015-12-26: qty 250

## 2015-12-26 MED ORDER — CLOPIDOGREL BISULFATE 75 MG PO TABS
75.0000 mg | ORAL_TABLET | Freq: Every day | ORAL | Status: DC
  Administered 2015-12-26 – 2015-12-29 (×4): 75 mg via ORAL
  Filled 2015-12-26 (×4): qty 1

## 2015-12-26 MED ORDER — METOPROLOL TARTRATE 1 MG/ML IV SOLN
1.2500 mg | Freq: Two times a day (BID) | INTRAVENOUS | Status: DC
  Administered 2015-12-26: 1.25 mg via INTRAVENOUS
  Filled 2015-12-26: qty 5

## 2015-12-26 MED ORDER — IPRATROPIUM-ALBUTEROL 0.5-2.5 (3) MG/3ML IN SOLN
3.0000 mL | RESPIRATORY_TRACT | Status: DC
  Administered 2015-12-26 – 2015-12-29 (×13): 3 mL via RESPIRATORY_TRACT
  Filled 2015-12-26 (×17): qty 3

## 2015-12-26 NOTE — ED Notes (Signed)
Dr. Jenkins at bedside. 

## 2015-12-26 NOTE — ED Provider Notes (Signed)
CSN: [id number]     Arrival date & time 12/26/15  1647 History   First MD Initiated Contact with Patient 12/26/15 1855     Chief Complaint  Patient presents with  . Shortness of Breath  . Cough      HPI Patient presents emergency department complaining of cough as well as fevers chills with myalgias over the past several days.  He reports his cough is productive reports that his breathing is increasingly worsened.  He does have a history of COPD.  He states he tried a breathing treatment last night and that made his symptoms worse and therefore is not tried any additional bronchodilators today.  He reports since this morning he is developed some increasing chest tightness with radiation across his chest and around both sides of his back.  No prior history of cardiac disease.  Denies unilateral leg swelling.  No history DVT or pulmonary embolism  Past Medical History  Diagnosis Date  . Emphysema lung (HCC)   . COPD (chronic obstructive pulmonary disease) (HCC)   . Emphysema of lung [facility])    Past Surgical History  Procedure Laterality Date  . Ruptured disc repair  2009 or 2010   Family History  Problem Relation Age of Onset  . Diabetes Father    Social History  Substance Use Topics  . Smoking status: Former Games developer  . Smokeless tobacco: Never Used     Comment: quit 2010  . Alcohol Use: No     Comment: quit 2004    Review of Systems  All other systems reviewed and are negative.     Allergies  Review of patient's allergies indicates no known allergies.  Home Medications   Prior to Admission medications   Not on File   BP 121/94 mmHg  Pulse 104  Temp(Src) 100.3 F (37.9 C) (Rectal)  Resp 19  SpO2 98% Physical Exam  Constitutional: He is oriented to person, place, and time. He appears well-developed and well-nourished.  HENT:  Head: Normocephalic and atraumatic.  Eyes: EOM are normal.  Neck: Normal range of motion.  Cardiovascular: Normal rate, regular rhythm,  normal heart sounds and intact distal pulses.   Pulmonary/Chest: Effort normal and breath sounds normal. No respiratory distress.  Abdominal: Soft. He exhibits no distension. There is no tenderness.  Musculoskeletal: Normal range of motion.  Neurological: He is alert and oriented to person, place, and time.  Skin: Skin is warm and dry.  Psychiatric: He has a normal mood and affect. Judgment normal.  Nursing note and vitals reviewed.   ED Course  Procedures (including critical care time) Labs Review Labs Reviewed  BASIC METABOLIC PANEL - Abnormal; Notable for the following:    Glucose, Bld 115 (*)    All other components within normal limits  CBC - Abnormal; Notable for the following:    RBC 4.21 (*)    All other components within normal limits  TROPONIN I - Abnormal; Notable for the following:    Troponin I 2.32 (*)    All other components within normal limits  I-STAT TROPOININ, ED - Abnormal; Notable for the following:    Troponin i, poc 0.91 (*)    All other components within normal limits  INFLUENZA PANEL BY PCR (TYPE A & B, H1N1)  I-STAT CG4 LACTIC ACID, ED    Imaging Review Dg Chest 2 View  12/26/2015  CLINICAL DATA:  Shortness of breath yesterday, progressive today. Occasional cough. EXAM: CHEST  2 VIEW COMPARISON:  Most recent chest  radiograph 05/27/2009 FINDINGS: Advanced emphysematous change with hyperinflation. Diffuse increased interstitial markings may reflect superimposed pulmonary edema. Blunting of the costophrenic angles, right greater than left, pleural effusions versus hyperinflation. Patchy airspace disease in the posterior right upper lobe. Heart is normal in size, mediastinal contours are normal. No pneumothorax. Remote bilateral rib fractures. IMPRESSION: 1. Patchy right upper lobe opacity, concerning for pneumonia. Followup PA and lateral chest X-ray is recommended in 3-4 weeks following trial of antibiotic therapy to ensure resolution and exclude underlying  malignancy. 2. Advanced emphysema. Increased interstitial markings from prior exam may reflect superimposed edema. Small pleural effusions versus hyperinflation. Question degree of volume overload/CHF, with concurrent pneumonia, all superimposed on chronic lung disease. Electronically Signed   By: Rubye Oaks M.D.   On: 12/26/2015 18:11   Ct Angio Chest Pe W/cm &/or Wo Cm  12/26/2015  CLINICAL DATA:  65 year old male with shortness of breath copy, tachycardia. EXAM: CT ANGIOGRAPHY CHEST WITH CONTRAST TECHNIQUE: Multidetector CT imaging of the chest was performed using the standard protocol during bolus administration of intravenous contrast. Multiplanar CT image reconstructions and MIPs were obtained to evaluate the vascular anatomy. CONTRAST:  80mL OMNIPAQUE IOHEXOL 350 MG/ML SOLN COMPARISON:  Radiograph dated 12/26/2015 FINDINGS: There is emphysematous changes of the lungs. Patchy area of consolidative changes at the right lung base as well as area of interstitial thickening in the right apical region are concerning for pneumonia. There is a small right pleural effusion. No pneumothorax. The central airways are patent. There is mild bronchiectatic changes with mild thickening of the bronchial wall. There is diffuse dilatation of the ascending aorta with involvement of the proximal arch measuring up to 5.2 cm in diameter. Evaluation of the aorta is limited as timing of the contrast was designed for optimal opacification of the pulmonary arteries. No definite dissection identified. There is no periaortic fluid. There is atherosclerotic calcification of thoracic aorta. No CT evidence of pulmonary embolism. There is no cardiomegaly or significant pericardial effusion. There is coronary vascular calcification. There is calcification of the aortic valves. There is thickened appearance of the left ventricular myocardium, likely myocardial hypertrophy. Mild dilatation of the left atrium. Correlation with  echocardiogram recommended. There is retrograde flow of contrast from the right atrium into the IVC compatible with a degree of right cardiac dysfunction. Right hilar and subcarinal adenopathy. The esophagus and the thyroid gland are grossly unremarkable. There is no axillary adenopathy. The chest wall soft tissues appear unremarkable. Old healed bilateral rib fractures. No acute fracture. The visualized upper abdomen appear grossly unremarkable. Review of the MIP images confirms the above findings. IMPRESSION: No CT evidence of pulmonary embolism. Emphysema with an area of consolidative change in the right lung base compatible with pneumonia. Small right pleural effusion. Diffuse dilatation of the ascending aorta and proximal arch measuring up to 5.2 cm. Evaluation of the aorta is limited on this study as timing of the contrast was designed to opacifying the pulmonary vasculature. No definite dissection identified. CT angiography with optimal opacification of the aorta may provide additional evaluation. Ascending thoracic aortic aneurysm. Recommend semi-annual imaging followup by CTA or MRA and referral to cardiothoracic surgery if not already obtained. This recommendation follows 2010 ACCF/AHA/AATS/ACR/ASA/SCA/SCAI/SIR/STS/SVM Guidelines for the Diagnosis and Management of Patients With Thoracic Aortic Disease. Circulation. 2010; 121: R604-V409 Calcification of the aortic valves with probable resulting aortic valve stenosis. Dilatation of the ascending aorta may represent post stenotic aneurysm. There is also left ventricular hypertrophy and mild dilatation of the left atrium. Echocardiogram  is recommended for further evaluation. Electronically Signed   By: Elgie Collard M.D.   On: 12/26/2015 21:19   I have personally reviewed and evaluated these images and lab results as part of my medical decision-making.   EKG Interpretation   Date/Time:  Saturday December 26 2015 18:53:44 EST Ventricular Rate:   119 PR Interval:  154 QRS Duration: 88 QT Interval:  325 QTC Calculation: 457 R Axis:   -130 Text Interpretation:  Ectopic atrial tachycardia, unifocal Multiple  premature complexes, vent & supraven Inferior infarct, old Lateral leads  are also involved No significant change was found Confirmed by Betheny Suchecki  MD,  Caryn Bee ([id number]) on 12/26/2015 8:58:44 PM      MDM   Final diagnoses:  None   Patiently treated for community acquired pneumonia and also covered for possible influenza-like illness with Tamiflu.  Influenza swab sent.  Patient does have mild elevation in his troponin but EKG 2 are without ischemic changes.  He does still have some ongoing chest discomfort.  His troponin is elevated at 2.3.  Cardiology will see the patient consultation and assist the hospitalist service and management.  They will need to continue cycling cardiac enzymes.   [facility]ist: Dr Lovell Sheehan Cardiology: Dr Donivan Scull, MD 12/27/15 [phone]

## 2015-12-26 NOTE — ED Notes (Signed)
MD at bedside. 

## 2015-12-26 NOTE — H&P (Signed)
[facility] Admission History and Physical       [name] ZOX:[id number] DOB: 1951-11-04 DOA: 12/26/2015  Referring physician: EDP PCP: Rose Fillers, PA-C  Specialists:   Chief Complaint:  SOB  HPI: Craig [name] is a 65 y.o. male with a history of COPD who presents to the ED with complaints of Flu like Symptoms with SOB and Cough and fevers and Chills x 6 days.  He had worsening SOB for the past 2 days and Chest Tightness and Chest pain since the AM.   He was evalauted in the ED and was hypoxic, to 91% and placed on supplemental NCO2.  He was also found to have Pneumonia of the RUL and RLL on CTA of the Chest which was without evidence of a Pulmonary Embolism.  He was also found to have an elevated troponin at 2.32.   He was placed on IV antibiotic rx for CAP, and Cardiology was consulted and he was referred for admission.      Review of Systems:  Constitutional: No Weight Loss, No Weight Gain, Night Sweats, +Fevers, +Chills, Dizziness, Light Headedness, Fatigue, or Generalized Weakness HEENT: No Headaches, Difficulty Swallowing,Tooth/Dental Problems,Sore Throat,  No Sneezing, Rhinitis, Ear Ache, Nasal Congestion, or Post Nasal Drip,  Cardio-vascular:  +Chest pain, Orthopnea, PND, Edema in Lower Extremities, Anasarca, Dizziness, Palpitations  Resp:   +Dyspnea, No DOE, +Productive Cough, No Non-Productive Cough, No Hemoptysis,  Wheezing.    GI: No Heartburn, Indigestion, Abdominal Pain, Nausea, Vomiting, Diarrhea, Constipation, Hematemesis, Hematochezia, Melena, Change in Bowel Habits,  Loss of Appetite  GU: No Dysuria, No Change in Color of Urine, No Urgency or Urinary Frequency, No Flank pain.  Musculoskeletal: No Joint Pain or Swelling, No Decreased Range of Motion, No Back Pain.  Neurologic: No Syncope, No Seizures, Muscle Weakness, Paresthesia, Vision Disturbance or Loss, No Diplopia, No Vertigo, No Difficulty Walking,  Skin: No Rash or Lesions. Psych: No  Change in Mood or Affect, No Depression or Anxiety, No Memory loss, No Confusion, or Hallucinations   Past Medical History  Diagnosis Date  . Emphysema lung (HCC)   . COPD (chronic obstructive pulmonary disease) (HCC)   . Emphysema of lung [facility])      Past Surgical History  Procedure Laterality Date  . Ruptured disc repair  2009 or 2010      Prior to Admission medications   Not on File     No Known Allergies  Social History:  reports that he has quit smoking. He has never used smokeless tobacco. He reports that he does not drink alcohol or use illicit drugs.    Family History  Problem Relation Age of Onset  . Diabetes Father       Physical Exam:  GEN:  Pleasant Well Nourished and Well Developed 65 y.o. Caucasian male examined and in no acute distress; cooperative with exam Filed Vitals:   12/26/15 1900 12/26/15 1945 12/26/15 2000 12/26/15 2100  BP: 129/90 136/99 136/107 121/94  Pulse: 108 106 112 104  Temp:   100.3 F (37.9 C)   TempSrc:   Rectal   Resp: 18 20 20 19   SpO2: 99% 99% 99% 98%   Blood pressure 121/94, pulse 104, temperature 100.3 F (37.9 C), temperature source Rectal, resp. rate 19, SpO2 98 %. PSYCH: He is alert and oriented x4; does not appear anxious does not appear depressed; affect is normal HEENT: Normocephalic and Atraumatic, Mucous membranes pink; PERRLA; EOM intact; Fundi:  Benign;  No scleral icterus, Nares:  Patent, Oropharynx: Clear, Sparse Dentition,    Neck:  FROM, No Cervical Lymphadenopathy nor Thyromegaly or Carotid Bruit; No JVD; Breasts:: Not examined CHEST WALL: No tenderness CHEST: Normal respiration, clear to auscultation bilaterally HEART: Regular rate and rhythm; no murmurs rubs or gallops BACK: No kyphosis or scoliosis; No CVA tenderness ABDOMEN: Positive Bowel Sounds, Soft Non-Tender, No Rebound or Guarding; No Masses, No Organomegaly. Rectal Exam: Not done EXTREMITIES: No Cyanosis, Clubbing, or Edema; No  Ulcerations. Genitalia: not examined PULSES: 2+ and symmetric SKIN: Normal hydration no rash or ulceration CNS:  Alert and Oriented x 4, No Focal Deficits Vascular: pulses palpable throughout    Labs on Admission:  Basic Metabolic Panel:  Recent Labs Lab 12/26/15 1731  NA 140  K 4.5  CL 106  CO2 22  GLUCOSE 115*  BUN 15  CREATININE 0.96  CALCIUM 9.5   Liver Function Tests: No results for input(s): AST, ALT, ALKPHOS, BILITOT, PROT, ALBUMIN in the last 168 hours. No results for input(s): LIPASE, AMYLASE in the last 168 hours. No results for input(s): AMMONIA in the last 168 hours. CBC:  Recent Labs Lab 12/26/15 1731  WBC 9.3  HGB 13.6  HCT 40.8  MCV 96.9  PLT 272   Cardiac Enzymes:  Recent Labs Lab 12/26/15 2013  TROPONINI 2.32*    BNP (last 3 results) No results for input(s): BNP in the last 8760 hours.  ProBNP (last 3 results) No results for input(s): PROBNP in the last 8760 hours.  CBG: No results for input(s): GLUCAP in the last 168 hours.  Radiological Exams on Admission: Dg Chest 2 View  12/26/2015  CLINICAL DATA:  Shortness of breath yesterday, progressive today. Occasional cough. EXAM: CHEST  2 VIEW COMPARISON:  Most recent chest radiograph 05/27/2009 FINDINGS: Advanced emphysematous change with hyperinflation. Diffuse increased interstitial markings may reflect superimposed pulmonary edema. Blunting of the costophrenic angles, right greater than left, pleural effusions versus hyperinflation. Patchy airspace disease in the posterior right upper lobe. Heart is normal in size, mediastinal contours are normal. No pneumothorax. Remote bilateral rib fractures. IMPRESSION: 1. Patchy right upper lobe opacity, concerning for pneumonia. Followup PA and lateral chest X-ray is recommended in 3-4 weeks following trial of antibiotic therapy to ensure resolution and exclude underlying malignancy. 2. Advanced emphysema. Increased interstitial markings from prior exam  may reflect superimposed edema. Small pleural effusions versus hyperinflation. Question degree of volume overload/CHF, with concurrent pneumonia, all superimposed on chronic lung disease. Electronically Signed   By: Rubye Oaks M.D.   On: 12/26/2015 18:11   Ct Angio Chest Pe W/cm &/or Wo Cm  12/26/2015  CLINICAL DATA:  65 year old male with shortness of breath copy, tachycardia. EXAM: CT ANGIOGRAPHY CHEST WITH CONTRAST TECHNIQUE: Multidetector CT imaging of the chest was performed using the standard protocol during bolus administration of intravenous contrast. Multiplanar CT image reconstructions and MIPs were obtained to evaluate the vascular anatomy. CONTRAST:  80mL OMNIPAQUE IOHEXOL 350 MG/ML SOLN COMPARISON:  Radiograph dated 12/26/2015 FINDINGS: There is emphysematous changes of the lungs. Patchy area of consolidative changes at the right lung base as well as area of interstitial thickening in the right apical region are concerning for pneumonia. There is a small right pleural effusion. No pneumothorax. The central airways are patent. There is mild bronchiectatic changes with mild thickening of the bronchial wall. There is diffuse dilatation of the ascending aorta with involvement of the proximal arch measuring up to 5.2 cm in diameter. Evaluation of the aorta is limited as timing of  the contrast was designed for optimal opacification of the pulmonary arteries. No definite dissection identified. There is no periaortic fluid. There is atherosclerotic calcification of thoracic aorta. No CT evidence of pulmonary embolism. There is no cardiomegaly or significant pericardial effusion. There is coronary vascular calcification. There is calcification of the aortic valves. There is thickened appearance of the left ventricular myocardium, likely myocardial hypertrophy. Mild dilatation of the left atrium. Correlation with echocardiogram recommended. There is retrograde flow of contrast from the right atrium into  the IVC compatible with a degree of right cardiac dysfunction. Right hilar and subcarinal adenopathy. The esophagus and the thyroid gland are grossly unremarkable. There is no axillary adenopathy. The chest wall soft tissues appear unremarkable. Old healed bilateral rib fractures. No acute fracture. The visualized upper abdomen appear grossly unremarkable. Review of the MIP images confirms the above findings. IMPRESSION: No CT evidence of pulmonary embolism. Emphysema with an area of consolidative change in the right lung base compatible with pneumonia. Small right pleural effusion. Diffuse dilatation of the ascending aorta and proximal arch measuring up to 5.2 cm. Evaluation of the aorta is limited on this study as timing of the contrast was designed to opacifying the pulmonary vasculature. No definite dissection identified. CT angiography with optimal opacification of the aorta may provide additional evaluation. Ascending thoracic aortic aneurysm. Recommend semi-annual imaging followup by CTA or MRA and referral to cardiothoracic surgery if not already obtained. This recommendation follows 2010 ACCF/AHA/AATS/ACR/ASA/SCA/SCAI/SIR/STS/SVM Guidelines for the Diagnosis and Management of Patients With Thoracic Aortic Disease. Circulation. 2010; 121: Z610-R604 Calcification of the aortic valves with probable resulting aortic valve stenosis. Dilatation of the ascending aorta may represent post stenotic aneurysm. There is also left ventricular hypertrophy and mild dilatation of the left atrium. Echocardiogram is recommended for further evaluation. Electronically Signed   By: Elgie Collard M.D.   On: 12/26/2015 21:19     EKG: Independently reviewed. Sinus Tachycardia rate = 125, Ischemic Changes in Inferior Leads.      Assessment/Plan:   65 y.o. male with  Active Problems:    1.    CAP (community acquired pneumonia)    IV Rocephin and Azithromycin    DuoNebs    O2    Monitor O2 sats      2.    NSTEMI  (non-ST elevated myocardial infarction) (HCC)    Cardiac Monitorin    IV NTG Drip    IV Heparin Drip    Plavix 75 mg PO x 1    ASA 325 mg PO x1    Metoprolol BID    Check Fasting Lipids    2D ECHO in AM    Cards Consulted to see          3.    COPD (chronic obstructive pulmonary disease) (HCC)    DuoNebs      4.    Hypoxia    NCO2    Monitor O2 sats      5.    DVT Prophylaxis    On IV Heparin Drip          Code Status:     FULL CODE      Family Communication:   No Family Present    Disposition Plan:    Inpatient Status with Expected LOS 3-4 days.       Time spent:  70 Minutes      Ron Parker [facility] Pager [phone]   If 7AM -7PM Please Contact the Day Rounding Team MD for [facility]  If 7PM-7AM, Please Contact Night-Floor Coverage  [email or web address] Password [place] 12/26/2015, 9:31 PM     ADDENDUM:   Patient was seen and examined on 12/26/2015

## 2015-12-26 NOTE — ED Notes (Signed)
Patient transported to CT 

## 2015-12-26 NOTE — Progress Notes (Signed)
ANTICOAGULATION CONSULT NOTE - Initial Consult  Pharmacy Consult for Heparin  IndicatioN: : ACS / STEMI  No Known Allergies  Patient Measurements:  Ht = 165 cm (03/2015) TBW = 67 kg (03/2015) IBW = 61.4 kg Heparin Dosing Weight: 67 kg  Vital Signs: Temp: 100.3 F (37.9 C) (02/18 2000) Temp Source: Rectal (02/18 2000) BP: 131/95 mmHg (02/18 2130) Pulse Rate: 101 (02/18 2130)  Labs:  Recent Labs  12/26/15 1731 12/26/15 2013  HGB 13.6  --   HCT 40.8  --   PLT 272  --   CREATININE 0.96  --   TROPONINI  --  2.32*    CrCl cannot be calculated (Unknown ideal weight.).  Estimated CrCl ~ 68 ml/min   Medical History: Past Medical History  Diagnosis Date  . Emphysema lung (HCC)   . COPD (chronic obstructive pulmonary disease) (HCC)   . Emphysema of lung (HCC)     Medications:   (Not in a hospital admission) Scheduled:  . [START ON 12/27/2015] aspirin  325 mg Oral Daily  . clopidogrel  75 mg Oral Daily  . ipratropium-albuterol  3 mL Nebulization Q4H  . oseltamivir  75 mg Oral Once    Assessment: 65 y.o male presented to ED with complaint of onset of sob yesterday but more severe today. Troponin elevated 0.91 >2.32 Pharmacy consulted to dose IV heparin for ACS/ STEMI.   Hgb 13.6/ HCt 40.8, pltc 272K   Patient not on any anticoagulation medications prior to admission.   Goal of Therapy:  Heparin level 0.3-0.7 units/ml Monitor platelets by anticoagulation protocol: Yes   Plan:  Heparin 4000 units IV bolus x1  Start IV Heparin drip at 800 units/hr Check 6 hour heparin level Daily heparin level and CBC  Noah Delaine, RPh Clinical Pharmacist Pager: [phone] 12/26/2015,9:52 PM

## 2015-12-26 NOTE — ED Notes (Signed)
RN attempted report to floor, no answer; Rn to call back

## 2015-12-26 NOTE — ED Notes (Signed)
Pt reports onset of sob yesterday but more severe today. Reports occ cough, denies swelling to extremities. spo2 91% at triage, ekg done.

## 2015-12-27 ENCOUNTER — Other Ambulatory Visit (HOSPITAL_COMMUNITY): Payer: Self-pay

## 2015-12-27 ENCOUNTER — Inpatient Hospital Stay (HOSPITAL_COMMUNITY): Payer: Medicaid Other

## 2015-12-27 ENCOUNTER — Encounter (HOSPITAL_COMMUNITY): Payer: Self-pay

## 2015-12-27 DIAGNOSIS — Z8639 Personal history of other endocrine, nutritional and metabolic disease: Secondary | ICD-10-CM | POA: Insufficient documentation

## 2015-12-27 DIAGNOSIS — R778 Other specified abnormalities of plasma proteins: Secondary | ICD-10-CM | POA: Insufficient documentation

## 2015-12-27 DIAGNOSIS — I7121 Aneurysm of the ascending aorta, without rupture: Secondary | ICD-10-CM | POA: Insufficient documentation

## 2015-12-27 DIAGNOSIS — R0902 Hypoxemia: Secondary | ICD-10-CM

## 2015-12-27 DIAGNOSIS — J441 Chronic obstructive pulmonary disease with (acute) exacerbation: Secondary | ICD-10-CM

## 2015-12-27 DIAGNOSIS — Z8679 Personal history of other diseases of the circulatory system: Secondary | ICD-10-CM | POA: Insufficient documentation

## 2015-12-27 DIAGNOSIS — R7989 Other specified abnormal findings of blood chemistry: Secondary | ICD-10-CM

## 2015-12-27 DIAGNOSIS — I712 Thoracic aortic aneurysm, without rupture: Secondary | ICD-10-CM | POA: Insufficient documentation

## 2015-12-27 DIAGNOSIS — J189 Pneumonia, unspecified organism: Secondary | ICD-10-CM

## 2015-12-27 DIAGNOSIS — J9601 Acute respiratory failure with hypoxia: Secondary | ICD-10-CM

## 2015-12-27 LAB — INFLUENZA PANEL BY PCR (TYPE A & B)
H1N1 flu by pcr: NOT DETECTED
INFLAPCR: NEGATIVE
Influenza B By PCR: NEGATIVE

## 2015-12-27 LAB — POCT I-STAT 3, ART BLOOD GAS (G3+)
Acid-base deficit: 8 mmol/L — ABNORMAL HIGH (ref 0.0–2.0)
Bicarbonate: 19.7 mEq/L — ABNORMAL LOW (ref 20.0–24.0)
O2 SAT: 80 %
PCO2 ART: 47.2 mmHg — AB (ref 35.0–45.0)
PO2 ART: 53 mmHg — AB (ref 80.0–100.0)
Patient temperature: 98.6
TCO2: 21 mmol/L (ref 0–100)
pH, Arterial: 7.229 — ABNORMAL LOW (ref 7.350–7.450)

## 2015-12-27 LAB — BLOOD GAS, ARTERIAL
ACID-BASE DEFICIT: 5.4 mmol/L — AB (ref 0.0–2.0)
ACID-BASE DEFICIT: 5.2 mmol/L — AB (ref 0.0–2.0)
BICARBONATE: 19.9 meq/L — AB (ref 20.0–24.0)
Bicarbonate: 22.3 mEq/L (ref 20.0–24.0)
DRAWN BY: 441371
DRAWN BY: 441371
FIO2: 1
FIO2: 0.4
LHR: 15 {breaths}/min
MECHVT: 500 mL
O2 Content: 8 L/min
O2 SAT: 98.5 %
O2 SAT: 91 %
PATIENT TEMPERATURE: 97.2
PEEP/CPAP: 5 cmH2O
PH ART: 7.158 — AB (ref 7.350–7.450)
Patient temperature: 97.2
TCO2: 24.4 mmol/L (ref 0–100)
TCO2: 21.1 mmol/L (ref 0–100)
pCO2 arterial: 64.7 mmHg (ref 35.0–45.0)
pCO2 arterial: 38.3 mmHg (ref 35.0–45.0)
pH, Arterial: 7.33 — ABNORMAL LOW (ref 7.350–7.450)
pO2, Arterial: 185 mmHg — ABNORMAL HIGH (ref 80.0–100.0)
pO2, Arterial: 61.7 mmHg — ABNORMAL LOW (ref 80.0–100.0)

## 2015-12-27 LAB — BASIC METABOLIC PANEL
Anion gap: 9 (ref 5–15)
BUN: 15 mg/dL (ref 6–20)
CO2: 24 mmol/L (ref 22–32)
Calcium: 8.6 mg/dL — ABNORMAL LOW (ref 8.9–10.3)
Chloride: 106 mmol/L (ref 101–111)
Creatinine, Ser: 0.98 mg/dL (ref 0.61–1.24)
GLUCOSE: 147 mg/dL — AB (ref 65–99)
Potassium: 4.4 mmol/L (ref 3.5–5.1)
SODIUM: 139 mmol/L (ref 135–145)

## 2015-12-27 LAB — LIPID PANEL
Cholesterol: 157 mg/dL (ref 0–200)
HDL: 30 mg/dL — AB (ref >40)
LDL Cholesterol: 117 mg/dL — ABNORMAL HIGH (ref 0–99)
Total CHOL/HDL Ratio: 5.2 RATIO
Triglycerides: 48 mg/dL (ref <150)
VLDL: 10 mg/dL (ref 0–40)

## 2015-12-27 LAB — BRAIN NATRIURETIC PEPTIDE: B NATRIURETIC PEPTIDE 5: 1306.4 pg/mL — AB (ref 0.0–100.0)

## 2015-12-27 LAB — TROPONIN I
TROPONIN I: 1.34 ng/mL — AB (ref <0.031)
TROPONIN I: 1.17 ng/mL — AB (ref <0.031)
Troponin I: 1.97 ng/mL (ref <0.031)
Troponin I: 1.22 ng/mL (ref <0.031)
Troponin I: 0.92 ng/mL (ref <0.031)

## 2015-12-27 LAB — GLUCOSE, CAPILLARY
GLUCOSE-CAPILLARY: 221 mg/dL — AB (ref 65–99)
Glucose-Capillary: 109 mg/dL — ABNORMAL HIGH (ref 65–99)
Glucose-Capillary: 145 mg/dL — ABNORMAL HIGH (ref 65–99)

## 2015-12-27 LAB — CBC
HCT: 35.3 % — ABNORMAL LOW (ref 39.0–52.0)
Hemoglobin: 11.5 g/dL — ABNORMAL LOW (ref 13.0–17.0)
MCH: 31.7 pg (ref 26.0–34.0)
MCHC: 32.6 g/dL (ref 30.0–36.0)
MCV: 97.2 fL (ref 78.0–100.0)
PLATELETS: 230 10*3/uL (ref 150–400)
RBC: 3.63 MIL/uL — ABNORMAL LOW (ref 4.22–5.81)
RDW: 13 % (ref 11.5–15.5)
WBC: 8.7 10*3/uL (ref 4.0–10.5)

## 2015-12-27 LAB — HEPARIN LEVEL (UNFRACTIONATED)
HEPARIN UNFRACTIONATED: 0.14 [IU]/mL — AB (ref 0.30–0.70)
HEPARIN UNFRACTIONATED: 0.13 [IU]/mL — AB (ref 0.30–0.70)

## 2015-12-27 LAB — STREP PNEUMONIAE URINARY ANTIGEN: STREP PNEUMO URINARY ANTIGEN: NEGATIVE

## 2015-12-27 LAB — MRSA PCR SCREENING: MRSA by PCR: NEGATIVE

## 2015-12-27 LAB — LACTIC ACID, PLASMA: Lactic Acid, Venous: 3.7 mmol/L (ref 0.5–2.0)

## 2015-12-27 MED ORDER — FENTANYL CITRATE (PF) 100 MCG/2ML IJ SOLN: 50.0000 ug | Freq: Once | INTRAMUSCULAR | Status: DC

## 2015-12-27 MED ORDER — CETYLPYRIDINIUM CHLORIDE 0.05 % MT LIQD
7.0000 mL | Freq: Two times a day (BID) | OROMUCOSAL | Status: DC
  Administered 2015-12-27 – 2016-01-06 (×16): 7 mL via OROMUCOSAL

## 2015-12-27 MED ORDER — SENNOSIDES 8.8 MG/5ML PO SYRP
5.0000 mL | ORAL_SOLUTION | Freq: Two times a day (BID) | ORAL | Status: DC | PRN
  Filled 2015-12-27: qty 5

## 2015-12-27 MED ORDER — ETOMIDATE 2 MG/ML IV SOLN
0.3000 mg/kg | Freq: Once | INTRAVENOUS | Status: AC
  Administered 2015-12-27: 20 mg via INTRAVENOUS

## 2015-12-27 MED ORDER — METOPROLOL TARTRATE 25 MG PO TABS
25.0000 mg | ORAL_TABLET | Freq: Two times a day (BID) | ORAL | Status: DC
  Administered 2015-12-27: 25 mg via ORAL
  Filled 2015-12-27: qty 1

## 2015-12-27 MED ORDER — ACETAMINOPHEN 325 MG PO TABS: 650.0000 mg | ORAL_TABLET | Freq: Four times a day (QID) | ORAL | Status: DC | PRN

## 2015-12-27 MED ORDER — CLOPIDOGREL BISULFATE 75 MG PO TABS
225.0000 mg | ORAL_TABLET | Freq: Every day | ORAL | Status: AC
  Administered 2015-12-27: 225 mg via ORAL
  Filled 2015-12-27: qty 3

## 2015-12-27 MED ORDER — SODIUM CHLORIDE 0.9 % IV SOLN
25.0000 ug/h | INTRAVENOUS | Status: DC
  Administered 2015-12-27: 100 ug/h via INTRAVENOUS
  Filled 2015-12-27: qty 50

## 2015-12-27 MED ORDER — PANTOPRAZOLE SODIUM 40 MG IV SOLR
40.0000 mg | INTRAVENOUS | Status: DC
  Administered 2015-12-27: 40 mg via INTRAVENOUS
  Filled 2015-12-27: qty 40

## 2015-12-27 MED ORDER — METHYLPREDNISOLONE SODIUM SUCC 125 MG IJ SOLR
80.0000 mg | Freq: Three times a day (TID) | INTRAMUSCULAR | Status: DC
  Administered 2015-12-27: 62.5 mg via INTRAVENOUS
  Administered 2015-12-27 – 2015-12-28 (×2): 80 mg via INTRAVENOUS
  Filled 2015-12-27 (×2): qty 1.28
  Filled 2015-12-27 (×2): qty 2
  Filled 2015-12-27: qty 1.28

## 2015-12-27 MED ORDER — FENTANYL CITRATE (PF) 100 MCG/2ML IJ SOLN
INTRAMUSCULAR | Status: AC
  Filled 2015-12-27: qty 4

## 2015-12-27 MED ORDER — HEPARIN BOLUS VIA INFUSION
1500.0000 [IU] | Freq: Once | INTRAVENOUS | Status: AC
  Administered 2015-12-27: 1500 [IU] via INTRAVENOUS
  Filled 2015-12-27: qty 1500

## 2015-12-27 MED ORDER — OXYCODONE HCL 5 MG PO TABS: 5.0000 mg | ORAL_TABLET | ORAL | Status: DC | PRN

## 2015-12-27 MED ORDER — ACETAMINOPHEN 650 MG RE SUPP: 650.0000 mg | Freq: Four times a day (QID) | RECTAL | Status: DC | PRN

## 2015-12-27 MED ORDER — MIDAZOLAM HCL 2 MG/2ML IJ SOLN
INTRAMUSCULAR | Status: AC
  Filled 2015-12-27: qty 4

## 2015-12-27 MED ORDER — MIDAZOLAM HCL 2 MG/2ML IJ SOLN
2.0000 mg | Freq: Once | INTRAMUSCULAR | Status: AC
  Administered 2015-12-27: 2 mg via INTRAVENOUS

## 2015-12-27 MED ORDER — MIDAZOLAM HCL 2 MG/2ML IJ SOLN: 2.0000 mg | INTRAMUSCULAR | Status: DC | PRN

## 2015-12-27 MED ORDER — BISACODYL 10 MG RE SUPP: 10.0000 mg | Freq: Every day | RECTAL | Status: DC | PRN

## 2015-12-27 MED ORDER — FENTANYL BOLUS VIA INFUSION
50.0000 ug | INTRAVENOUS | Status: DC | PRN
  Filled 2015-12-27: qty 50

## 2015-12-27 MED ORDER — ONDANSETRON HCL 4 MG PO TABS: 4.0000 mg | ORAL_TABLET | Freq: Four times a day (QID) | ORAL | Status: DC | PRN

## 2015-12-27 MED ORDER — FENTANYL CITRATE (PF) 100 MCG/2ML IJ SOLN
100.0000 ug | Freq: Once | INTRAMUSCULAR | Status: AC
  Administered 2015-12-27: 100 ug via INTRAVENOUS

## 2015-12-27 MED ORDER — ATORVASTATIN CALCIUM 80 MG PO TABS
80.0000 mg | ORAL_TABLET | Freq: Every day | ORAL | Status: DC
  Administered 2015-12-27 – 2016-01-10 (×14): 80 mg via ORAL
  Filled 2015-12-27 (×16): qty 1

## 2015-12-27 MED ORDER — METHYLPREDNISOLONE SODIUM SUCC 40 MG IJ SOLR
40.0000 mg | Freq: Two times a day (BID) | INTRAMUSCULAR | Status: DC
  Administered 2015-12-27: 40 mg via INTRAVENOUS
  Filled 2015-12-27: qty 1

## 2015-12-27 MED ORDER — HEPARIN SODIUM (PORCINE) 5000 UNIT/ML IJ SOLN
5000.0000 [IU] | Freq: Three times a day (TID) | INTRAMUSCULAR | Status: DC
  Administered 2015-12-27 – 2015-12-28 (×3): 5000 [IU] via SUBCUTANEOUS
  Filled 2015-12-27 (×6): qty 1

## 2015-12-27 MED ORDER — HYDROMORPHONE HCL 1 MG/ML IJ SOLN
0.5000 mg | INTRAMUSCULAR | Status: DC | PRN
  Administered 2015-12-27: 0.5 mg via INTRAVENOUS
  Filled 2015-12-27: qty 1

## 2015-12-27 MED ORDER — ALBUTEROL SULFATE (2.5 MG/3ML) 0.083% IN NEBU
2.5000 mg | INHALATION_SOLUTION | RESPIRATORY_TRACT | Status: DC | PRN
  Administered 2015-12-30: 2.5 mg via RESPIRATORY_TRACT
  Filled 2015-12-27: qty 3

## 2015-12-27 MED ORDER — SODIUM CHLORIDE 0.9 % IV SOLN
INTRAVENOUS | Status: DC
  Administered 2015-12-27: 01:00:00 via INTRAVENOUS

## 2015-12-27 MED ORDER — ONDANSETRON HCL 4 MG/2ML IJ SOLN: 4.0000 mg | Freq: Four times a day (QID) | INTRAMUSCULAR | Status: DC | PRN

## 2015-12-27 MED ORDER — LORAZEPAM 2 MG/ML IJ SOLN
0.5000 mg | Freq: Once | INTRAMUSCULAR | Status: AC
  Administered 2015-12-27: 0.5 mg via INTRAVENOUS
  Filled 2015-12-27: qty 1

## 2015-12-27 MED ORDER — MIDAZOLAM HCL 2 MG/2ML IJ SOLN
2.0000 mg | INTRAMUSCULAR | Status: DC | PRN
  Administered 2015-12-27: 2 mg via INTRAVENOUS
  Filled 2015-12-27: qty 2

## 2015-12-27 MED ORDER — METOPROLOL TARTRATE 50 MG PO TABS
50.0000 mg | ORAL_TABLET | Freq: Two times a day (BID) | ORAL | Status: DC
  Filled 2015-12-27: qty 1

## 2015-12-27 NOTE — Procedures (Signed)
Intubation Procedure Note Craig [name] [id number] [birth year]  Procedure: Intubation Indications: Respiratory insufficiency  Procedure Details Consent: Risks of procedure as well as the alternatives and risks of each were explained to the (patient/caregiver).  Consent for procedure obtained. Time Out: Verified patient identification, verified procedure, site/side was marked, verified correct patient position, special equipment/implants available, medications/allergies/relevent history reviewed, required imaging and test results available.  Performed  Maximum sterile technique was used including gloves, hand hygiene and mask.  MAC and 3  Versed 2 Fentanyl 100 mcg Etomidate 20  Evaluation Hemodynamic Status: BP stable throughout; O2 sats: stable throughout Patient's Current Condition: stable Complications: No apparent complications Patient did tolerate procedure well. Chest X-ray ordered to verify placement.  CXR: tube position acceptable.   [name] V. 12/27/2015

## 2015-12-27 NOTE — Progress Notes (Signed)
Utilization Review Completed.Craig [name] T2/19/2017  

## 2015-12-27 NOTE — Consult Note (Signed)
PULMONARY / CRITICAL CARE MEDICINE   Name: Craig [name] MRN: [id number] DOB: April 14, 1951    ADMISSION DATE:  12/26/2015 CONSULTATION DATE:  12/27/15  REFERRING MD:  Dr. David Stall  CHIEF COMPLAINT:  Acute Hypoxic Respiratory Failure  HISTORY OF PRESENT ILLNESS:   64 y/o M with PMH of HTN, HLD, ETOH abuse, tobacco abuse and COPD who presented to [facility] on 12/26/15 with a 24 hour history of worsening shortness of breath, cough, chest tightness, fevers/chilld, and myalgias.    On arrival, he was found to have room air saturation of 91%.  The patient reported he attempted home breathing treatments without relief of symptoms.   Initial CXR showed concerns for a patchy RUL infiltrate superimposed on advanced emphysema, possible degree of volume overload.  Labs - Na 140, K 4.5, Sr Cr 0.96, glucose 115, troponin 0.9, WBC 8.7, hgb 11.5 and platelets 230.  Follow up lactic acid rose to 2.32.  Given chest tightness, he was evaluated with a CTA of the chest which was negative for PE but demonstrated emphysema, RUL consolidation, small R pleural effusion, dilation of ascending aortia, proximal arch measuring up to 5.2 cm without dissection, and ascending thoracic aortic aneurysm.    The patient was admitted per [facility] for community acquired PNA.  He was treated with IV rocephin / azithromycin.  Due to elevated troponin, Cardiology was consulted for evaluation.  He was placed on a heparin gtt, nitro gtt, plavix, asa, and lopressor.  Cardiology evaluation felt elevated troponin likely due to demand ischemia in the setting of PNA with underlying severe emphysema.    On 2/19, the patient had progressive respiratory distress and hypoxemia.  A trial of bipap was initiate but he did not tolerate.  The patient became diaphoretic with increased respiratory effort.  PCCM consulted for evaluation.  Intubated and transferred to ICU.      PAST MEDICAL HISTORY :  He  has a past medical history of COPD (chronic obstructive  pulmonary disease) (HCC); Hypertension; Hyperlipidemia; Alcohol abuse; and Tobacco abuse.  PAST SURGICAL HISTORY: He  has past surgical history that includes ruptured disc repair (2009 or 2010).  No Known Allergies  No current facility-administered medications on file prior to encounter.   No current outpatient prescriptions on file prior to encounter.    FAMILY HISTORY:  His indicated that his mother is alive. He indicated that his father is alive.   SOCIAL HISTORY: He  reports that he has quit smoking. He has never used smokeless tobacco. He reports that he does not drink alcohol or use illicit drugs.  REVIEW OF SYSTEMS:   Unable to complete due to respiratory distress / intubation   SUBJECTIVE:    VITAL SIGNS: BP 106/76 mmHg  Pulse 89  Temp(Src) 97.2 F (36.2 C) (Axillary)  Resp 27  Ht 5\' 9"  (1.753 m)  Wt 144 lb 13.5 oz (65.7 kg)  BMI 21.38 kg/m2  SpO2 89%  HEMODYNAMICS:    VENTILATOR SETTINGS: Vent Mode:  [-] PRVC FiO2 (%):  [100 %] 100 % Set Rate:  [15 bmp] 15 bmp Vt Set:  [500 mL] 500 mL PEEP:  [5 cmH20] 5 cmH20 Plateau Pressure:  [12 cmH20] 12 cmH20  INTAKE / OUTPUT: I/O last 3 completed shifts: In: 939.1 [P.O.:120; I.V.:819.1] Out: 425 [Urine:425]  PHYSICAL EXAMINATION: General:  Thin adult male on vent Neuro:  Sedate, pupils 11mm=R HEENT:  MM pink/moist, ETT, JVD+ Cardiovascular:  s1s2 rrr, no m/r/g  Lungs:  Less labored post intubation, lungs bilaterally with  diffuse wheezing Abdomen:  Soft, bsx4 active Musculoskeletal:  No acute deformities  Skin:  Diaphoretic, warm, no edema, rashes or lesions  LABS:  BMET  Recent Labs Lab 12/26/15 1731 12/27/15 0220  NA 140 139  K 4.5 4.4  CL 106 106  CO2 22 24  BUN 15 15  CREATININE 0.96 0.98  GLUCOSE 115* 147*    Electrolytes  Recent Labs Lab 12/26/15 1731 12/27/15 0220  CALCIUM 9.5 8.6*    CBC  Recent Labs Lab 12/26/15 1731 12/27/15 0220  WBC 9.3 8.7  HGB 13.6 11.5*  HCT  40.8 35.3*  PLT 272 230    Coag's  Recent Labs Lab 12/26/15 2156  APTT 32  INR 1.16    Sepsis Markers  Recent Labs Lab 12/26/15 2021 12/26/15 2213  LATICACIDVEN 0.88 0.88    ABG  Recent Labs Lab 12/27/15 1158  PHART 7.229*  PCO2ART 47.2*  PO2ART 53.0*    Liver Enzymes No results for input(s): AST, ALT, ALKPHOS, BILITOT, ALBUMIN in the last 168 hours.  Cardiac Enzymes  Recent Labs Lab 12/27/15 0220 12/27/15 0632 12/27/15 1032  TROPONINI 1.97* 1.34* 1.17*    Glucose  Recent Labs Lab 12/27/15 0831 12/27/15 1142  GLUCAP 109* 221*    Imaging Dg Chest 2 View  12/26/2015  CLINICAL DATA:  Shortness of breath yesterday, progressive today. Occasional cough. EXAM: CHEST  2 VIEW COMPARISON:  Most recent chest radiograph 05/27/2009 FINDINGS: Advanced emphysematous change with hyperinflation. Diffuse increased interstitial markings may reflect superimposed pulmonary edema. Blunting of the costophrenic angles, right greater than left, pleural effusions versus hyperinflation. Patchy airspace disease in the posterior right upper lobe. Heart is normal in size, mediastinal contours are normal. No pneumothorax. Remote bilateral rib fractures. IMPRESSION: 1. Patchy right upper lobe opacity, concerning for pneumonia. Followup PA and lateral chest X-ray is recommended in 3-4 weeks following trial of antibiotic therapy to ensure resolution and exclude underlying malignancy. 2. Advanced emphysema. Increased interstitial markings from prior exam may reflect superimposed edema. Small pleural effusions versus hyperinflation. Question degree of volume overload/CHF, with concurrent pneumonia, all superimposed on chronic lung disease. Electronically Signed   By: Rubye Oaks M.D.   On: 12/26/2015 18:11   Ct Angio Chest Pe W/cm &/or Wo Cm  12/26/2015  CLINICAL DATA:  65 year old male with shortness of breath copy, tachycardia. EXAM: CT ANGIOGRAPHY CHEST WITH CONTRAST TECHNIQUE:  Multidetector CT imaging of the chest was performed using the standard protocol during bolus administration of intravenous contrast. Multiplanar CT image reconstructions and MIPs were obtained to evaluate the vascular anatomy. CONTRAST:  80mL OMNIPAQUE IOHEXOL 350 MG/ML SOLN COMPARISON:  Radiograph dated 12/26/2015 FINDINGS: There is emphysematous changes of the lungs. Patchy area of consolidative changes at the right lung base as well as area of interstitial thickening in the right apical region are concerning for pneumonia. There is a small right pleural effusion. No pneumothorax. The central airways are patent. There is mild bronchiectatic changes with mild thickening of the bronchial wall. There is diffuse dilatation of the ascending aorta with involvement of the proximal arch measuring up to 5.2 cm in diameter. Evaluation of the aorta is limited as timing of the contrast was designed for optimal opacification of the pulmonary arteries. No definite dissection identified. There is no periaortic fluid. There is atherosclerotic calcification of thoracic aorta. No CT evidence of pulmonary embolism. There is no cardiomegaly or significant pericardial effusion. There is coronary vascular calcification. There is calcification of the aortic valves. There is thickened appearance  of the left ventricular myocardium, likely myocardial hypertrophy. Mild dilatation of the left atrium. Correlation with echocardiogram recommended. There is retrograde flow of contrast from the right atrium into the IVC compatible with a degree of right cardiac dysfunction. Right hilar and subcarinal adenopathy. The esophagus and the thyroid gland are grossly unremarkable. There is no axillary adenopathy. The chest wall soft tissues appear unremarkable. Old healed bilateral rib fractures. No acute fracture. The visualized upper abdomen appear grossly unremarkable. Review of the MIP images confirms the above findings. IMPRESSION: No CT evidence of  pulmonary embolism. Emphysema with an area of consolidative change in the right lung base compatible with pneumonia. Small right pleural effusion. Diffuse dilatation of the ascending aorta and proximal arch measuring up to 5.2 cm. Evaluation of the aorta is limited on this study as timing of the contrast was designed to opacifying the pulmonary vasculature. No definite dissection identified. CT angiography with optimal opacification of the aorta may provide additional evaluation. Ascending thoracic aortic aneurysm. Recommend semi-annual imaging followup by CTA or MRA and referral to cardiothoracic surgery if not already obtained. This recommendation follows 2010 ACCF/AHA/AATS/ACR/ASA/SCA/SCAI/SIR/STS/SVM Guidelines for the Diagnosis and Management of Patients With Thoracic Aortic Disease. Circulation. 2010; 121: W098-J191 Calcification of the aortic valves with probable resulting aortic valve stenosis. Dilatation of the ascending aorta may represent post stenotic aneurysm. There is also left ventricular hypertrophy and mild dilatation of the left atrium. Echocardiogram is recommended for further evaluation. Electronically Signed   By: Elgie Collard M.D.   On: 12/26/2015 21:19     STUDIES:  2/18  CTA Chest >> negative for PE, emphysema, RUL consolidation, small R pleural effusion, dilation of ascending aortia, proximal arch measuring up to 5.2 cm without dissection, and ascending thoracic aortic aneurysm.   CULTURES: Tracheal Aspirate 2/19 >>  Flu 2/18 >> negative   ANTIBIOTICS: Rocephin 2/18 >>  Azithromycin 2/18 >>  Tamiflu 2/18 >>   SIGNIFICANT EVENTS: 2/18  Admit with SOB, RUL PNA.  PE ruled out.  Elevated trop leak thought demand ischemia.   2/19  Decompensated requiring intubation due to resp distress   LINES/TUBES: ETT 2/19 >>   DISCUSSION: 65 y/o M with PMH of emphysema / COPD admitted 2/18 with increased SOB, chest tightness.  Found to have RUL PNA.  Flu negative. Elevated troponin  in setting of demand ischemia.  Decompensated on 2/19 requiring intubation.    ASSESSMENT / PLAN:  PULMONARY A: Acute Hypoxic Respiratory Failure - in the setting of RUL PNA, Severe Emphysema with acute exacerbation, possible component of pulmonary edema. Flu negative PCR.  Acute Exacerbation of COPD - secondary to RUL infiltrate Hx Tobacco Abuse  P:   PRVC 8 cc/kg, R 15 Wean PEEP / FiO2 for sats 88-95% Follow up CXR post intubation  ABG post intubation  Monitor RUL infiltrate Duoneb Q6 with Q3 PRN albuterol  Increase solumedrol to 80mg  IV Q8 (bronchospasm) See ID   CARDIOVASCULAR A:  Troponin Leak - likely demand ischemia  ? Component CHF  Mild Hypotension - sedation related, resolving.  P:  Cardiology following, appreciate input Defer further work up to Cardiology  D/C lopressor with mild hypotension.  If needs to be restarted, consider bisoprolol Tx to ICU  Await ECHO  Continue lipitor, ASA Would like to diurese once BP stabilizes  RENAL A:   Lactic Acidosis - suspect in setting of respiratory distress At Risk AKI - in setting of hypotension  P:   Trend BMP / UOP  Replace electrolytes as  indicated   GASTROINTESTINAL A:   No acute issues  P:   NPO / OGT  PPI while intubated, not a prior home medication   HEMATOLOGIC A:   Anemia - mild, no evidence of acute bleeding  P:  Trend CBC  Add heparin SQ   INFECTIOUS A:   RUL PNA (NOS)  P:   abx / culture as above  Empiric tamiflu as above Obtain sputum culture  Droplet precautions  ENDOCRINE A:   At Risk Hypoglycemia    P:   Monitor glucose on BMP   NEUROLOGIC A:   Acute Metabolic Encephalopathy - in setting of hypoxic respiratory failure  P:   RASS goal: -1 Fentanyl gtt for pain  PRN Versed for sedation    FAMILY  - Updates: Son updated at bedside.    - Inter-disciplinary family meet or Palliative Care meeting due by: 2/26  Canary Brim, NP-[facility] Pgr:  [phone] or if no answer [phone] 12/27/2015, 1:07 PM

## 2015-12-27 NOTE — Progress Notes (Signed)
After getting up at bedside to use urinal, patient experienced an episode of forceful coughing and dyspnea. Patient became diaphoretic, tachypneic and complained of 3/10 left side chest pain(nonradiating); O2 sats decreased to 89-90% on 2L nasal cannula; Oxygen increased to 4L, 12 lead EKG obtained(no acute changes noted); nitroglycerin drip resumed at . Dr Sullivan Lone notified; no new orders obtained; NTG drip stopped after speaking with Dr Sullivan Lone. Vital signs as per noted in chart.

## 2015-12-27 NOTE — Progress Notes (Addendum)
SUBJECTIVE: The patient is very ill.  + diaphoresis, SOB and cough,  Denies CP today  . antiseptic oral rinse  7 mL Mouth Rinse BID  . aspirin  325 mg Oral Daily  . atorvastatin  80 mg Oral q1800  . azithromycin  500 mg Intravenous Q24H  . cefTRIAXone (ROCEPHIN)  IV  1 g Intravenous Q24H  . clopidogrel  75 mg Oral Daily  . ipratropium-albuterol  3 mL Nebulization Q4H  . methylPREDNISolone (SOLU-MEDROL) injection  40 mg Intravenous Q12H  . metoprolol tartrate  25 mg Oral BID   . sodium chloride 10 mL/hr (12/27/15 0838)  . heparin 1,000 Units/hr (12/27/15 0800)    OBJECTIVE: Physical Exam: Filed Vitals:   12/27/15 0640 12/27/15 0747 12/27/15 0800 12/27/15 0837  BP: 141/99 117/97 127/94 131/102  Pulse: 107 90 91 111  Temp:  97.6 F (36.4 C)    TempSrc:  Oral    Resp: Height:      Weight:      SpO2: 97% 94% 94%     Intake/Output Summary (Last 24 hours) at 12/27/15 1115 Last data filed at 12/27/15 0900  Gross per 24 hour  Intake 1455.22 ml  Output    425 ml  Net 1030.22 ml    Telemetry reveals sinus tachycardia with frequent pacs and nonsustained atrial tachycardia  GEN- The patient is ill appearing, alert and oriented x 3 today.  diaphoretic Head- normocephalic, atraumatic Eyes-  Sclera clear, conjunctiva pink Ears- hearing intact Oropharynx- clear Neck- supple,   Lungs-coarse BS with expiratory wheezes, normal work of breathing Heart- tachycardic irregular rhythm GI- soft, NT, ND, + BS Extremities- no clubbing, cyanosis, or edema Skin- no rash or lesion Psych- euthymic mood, full affect Neuro- strength and sensation are intact  LABS: Basic Metabolic Panel:  Recent Labs  09/81/19 1731 12/27/15 0220  NA 140 139  K 4.5 4.4  CL 106 106  CO2 22 24  GLUCOSE 115* 147*  BUN 15 15  CREATININE 0.96 0.98  CALCIUM 9.5 8.6*   Liver Function Tests: No results for input(s): AST, ALT, ALKPHOS, BILITOT, PROT, ALBUMIN in the last 72 hours. No  results for input(s): LIPASE, AMYLASE in the last 72 hours. CBC:  Recent Labs  12/26/15 1731 12/27/15 0220  WBC 9.3 8.7  HGB 13.6 11.5*  HCT 40.8 35.3*  MCV 96.9 97.2  PLT 272 230   Cardiac Enzymes:  Recent Labs  12/26/15 2013 12/27/15 0220 12/27/15 0632  TROPONINI 2.32* 1.97* 1.34*   BNP: Invalid input(s): POCBNP D-Dimer: No results for input(s): DDIMER in the last 72 hours. Hemoglobin A1C: No results for input(s): HGBA1C in the last 72 hours. Fasting Lipid Panel:  Recent Labs  12/27/15 0220  CHOL 157  HDL 30*  LDLCALC 117*  TRIG 48  CHOLHDL 5.2   Thyroid Function Tests: No results for input(s): TSH, T4TOTAL, T3FREE, THYROIDAB in the last 72 hours.  Invalid input(s): FREET3 Anemia Panel: No results for input(s): VITAMINB12, FOLATE, FERRITIN, TIBC, IRON, RETICCTPCT in the last 72 hours.  RADIOLOGY: Dg Chest 2 View  12/26/2015  CLINICAL DATA:  Shortness of breath yesterday, progressive today. Occasional cough. EXAM: CHEST  2 VIEW COMPARISON:  Most recent chest radiograph 05/27/2009 FINDINGS: Advanced emphysematous change with hyperinflation. Diffuse increased interstitial markings may reflect superimposed pulmonary edema. Blunting of the costophrenic angles, right greater than left, pleural effusions versus hyperinflation. Patchy airspace disease in the posterior right upper lobe. Heart is normal in size, mediastinal  contours are normal. No pneumothorax. Remote bilateral rib fractures. IMPRESSION: 1. Patchy right upper lobe opacity, concerning for pneumonia. Followup PA and lateral chest X-ray is recommended in 3-4 weeks following trial of antibiotic therapy to ensure resolution and exclude underlying malignancy. 2. Advanced emphysema. Increased interstitial markings from prior exam may reflect superimposed edema. Small pleural effusions versus hyperinflation. Question degree of volume overload/CHF, with concurrent pneumonia, all superimposed on chronic lung disease.  Electronically Signed   By: Rubye Oaks M.D.   On: 12/26/2015 18:11   Ct Angio Chest Pe W/cm &/or Wo Cm  12/26/2015  CLINICAL DATA:  65 year old male with shortness of breath copy, tachycardia. EXAM: CT ANGIOGRAPHY CHEST WITH CONTRAST TECHNIQUE: Multidetector CT imaging of the chest was performed using the standard protocol during bolus administration of intravenous contrast. Multiplanar CT image reconstructions and MIPs were obtained to evaluate the vascular anatomy. CONTRAST:  80mL OMNIPAQUE IOHEXOL 350 MG/ML SOLN COMPARISON:  Radiograph dated 12/26/2015 FINDINGS: There is emphysematous changes of the lungs. Patchy area of consolidative changes at the right lung base as well as area of interstitial thickening in the right apical region are concerning for pneumonia. There is a small right pleural effusion. No pneumothorax. The central airways are patent. There is mild bronchiectatic changes with mild thickening of the bronchial wall. There is diffuse dilatation of the ascending aorta with involvement of the proximal arch measuring up to 5.2 cm in diameter. Evaluation of the aorta is limited as timing of the contrast was designed for optimal opacification of the pulmonary arteries. No definite dissection identified. There is no periaortic fluid. There is atherosclerotic calcification of thoracic aorta. No CT evidence of pulmonary embolism. There is no cardiomegaly or significant pericardial effusion. There is coronary vascular calcification. There is calcification of the aortic valves. There is thickened appearance of the left ventricular myocardium, likely myocardial hypertrophy. Mild dilatation of the left atrium. Correlation with echocardiogram recommended. There is retrograde flow of contrast from the right atrium into the IVC compatible with a degree of right cardiac dysfunction. Right hilar and subcarinal adenopathy. The esophagus and the thyroid gland are grossly unremarkable. There is no axillary  adenopathy. The chest wall soft tissues appear unremarkable. Old healed bilateral rib fractures. No acute fracture. The visualized upper abdomen appear grossly unremarkable. Review of the MIP images confirms the above findings. IMPRESSION: No CT evidence of pulmonary embolism. Emphysema with an area of consolidative change in the right lung base compatible with pneumonia. Small right pleural effusion. Diffuse dilatation of the ascending aorta and proximal arch measuring up to 5.2 cm. Evaluation of the aorta is limited on this study as timing of the contrast was designed to opacifying the pulmonary vasculature. No definite dissection identified. CT angiography with optimal opacification of the aorta may provide additional evaluation. Ascending thoracic aortic aneurysm. Recommend semi-annual imaging followup by CTA or MRA and referral to cardiothoracic surgery if not already obtained. This recommendation follows 2010 ACCF/AHA/AATS/ACR/ASA/SCA/SCAI/SIR/STS/SVM Guidelines for the Diagnosis and Management of Patients With Thoracic Aortic Disease. Circulation. 2010; 121: Z610-R604 Calcification of the aortic valves with probable resulting aortic valve stenosis. Dilatation of the ascending aorta may represent post stenotic aneurysm. There is also left ventricular hypertrophy and mild dilatation of the left atrium. Echocardiogram is recommended for further evaluation. Electronically Signed   By: Elgie Collard M.D.   On: 12/26/2015 21:19    ASSESSMENT AND PLAN:  Acute respiratory failure with hypoxia (HCC)  Most likely due to pneumonia.  Has flu like presentation.  My  suspicion for ACS as primary issue is low.  I suspect demand ischemia. He did have significant elevation in his troponin however. Has been placed previously on ASA, beta blocker, plavix, and statin Will stop heparin drip at this time and follow closely Titrate metoprolol Echo is pending If normal wall motion by echo, could consider outpatent  myoview and stop plavix If echo is abnormal, may require more inpatient evaluation.  HTN Stable No change required today  Ascending aortic aneurysm - No evidence of dissection on imaging. - This will need to be monitored on an outpatient basis after discharge.  OK to transfer to telemetry once other medical issues improve  Hillis Range, MD 12/27/2015 11:15 AM

## 2015-12-27 NOTE — Progress Notes (Addendum)
[facility] PROGRESS NOTE    Progress Note   Craig [name] QMV:[id number] DOB: [birth year] DOA: 12/26/2015 PCP: Rose Fillers, PA-C   Brief Narrative:   Craig [name] is an 65 y.o. male past medical history of COPD who presents to the ED with flulike symptoms  Assessment/Plan:   Acute respiratory failure with hypoxia (HCC) ? Due to CAP (community acquired pneumonia) vs and or NSTEMI (non-ST elevated myocardial infarction) (HCC)/  COPD (chronic obstructive pulmonary disease) (HCC): Confusing picture, as he started having shortness of breath mainly and he describes chest tightness which she related got worse with the DuoNeb treatments. I agree with IV empiric antibiotics for community-acquired pneumonia, he was also started on Tamiflu and an influenza PCR is pending. He relates he's been having chills at home, has no wheezing on physical exam, and a CT scan shows emphysema with asymmetric pulmonary infiltrates. We'll start him empirically on IV steroids. Has been moving poor air on physical exam has no wheezing. Has positive hepatojugular reflex on physical exam, KVO his IV fluids. He is not on a diuretic at home. 2-D echo is pending to look for motion abnormality. He was started on IV heparin, aspirin, statins and Plavix. His heart rate is running around the 110s. We'll start him on a selective beta blocker low dose to try to slow down. Appreciate cardiology's assistance. Consult CCM, worsning respiratory failure with hypoxia not able to tolerate bipap.  Elevated troponin: Cardiology is on board, he is on aspirin, Plavix, statin, heparin and metoprolol. There is no previous EKGs, seems to have a Q-wave in inferior weighs, with a lot of ectopy, and ST segment changes. Question of this is due to demand ischemia in the setting of pulmonary infectious etiology. Troponins are trending down, 2-D echo is pending.  History of hypertension; Continue metoprolol blood pressure  sensory stable.    History of hyperlipidemia Cot statins  Ascending aortic aneurysm (HCC)    DVT Prophylaxis - Lovenox ordered.  Family Communication: none Disposition Plan: keep in SDU. Code Status:     Code Status Orders        Start     Ordered   12/27/15 0058  Full code   Continuous     12/27/15 0057    Code Status History    Date Active Date Inactive Code Status Order ID Comments User Context   This patient has a current code status but no historical code status.        IV Access:    Peripheral IV   Procedures and diagnostic studies:   Dg Chest 2 View  12/26/2015  CLINICAL DATA:  Shortness of breath yesterday, progressive today. Occasional cough. EXAM: CHEST  2 VIEW COMPARISON:  Most recent chest radiograph 05/27/2009 FINDINGS: Advanced emphysematous change with hyperinflation. Diffuse increased interstitial markings may reflect superimposed pulmonary edema. Blunting of the costophrenic angles, right greater than left, pleural effusions versus hyperinflation. Patchy airspace disease in the posterior right upper lobe. Heart is normal in size, mediastinal contours are normal. No pneumothorax. Remote bilateral rib fractures. IMPRESSION: 1. Patchy right upper lobe opacity, concerning for pneumonia. Followup PA and lateral chest X-ray is recommended in 3-4 weeks following trial of antibiotic therapy to ensure resolution and exclude underlying malignancy. 2. Advanced emphysema. Increased interstitial markings from prior exam may reflect superimposed edema. Small pleural effusions versus hyperinflation. Question degree of volume overload/CHF, with concurrent pneumonia, all superimposed on chronic lung disease. Electronically Signed   By: Lujean Rave.D.  On: 12/26/2015 18:11   Ct Angio Chest Pe W/cm &/or Wo Cm  12/26/2015  CLINICAL DATA:  65 year old male with shortness of breath copy, tachycardia. EXAM: CT ANGIOGRAPHY CHEST WITH CONTRAST TECHNIQUE: Multidetector CT  imaging of the chest was performed using the standard protocol during bolus administration of intravenous contrast. Multiplanar CT image reconstructions and MIPs were obtained to evaluate the vascular anatomy. CONTRAST:  80mL OMNIPAQUE IOHEXOL 350 MG/ML SOLN COMPARISON:  Radiograph dated 12/26/2015 FINDINGS: There is emphysematous changes of the lungs. Patchy area of consolidative changes at the right lung base as well as area of interstitial thickening in the right apical region are concerning for pneumonia. There is a small right pleural effusion. No pneumothorax. The central airways are patent. There is mild bronchiectatic changes with mild thickening of the bronchial wall. There is diffuse dilatation of the ascending aorta with involvement of the proximal arch measuring up to 5.2 cm in diameter. Evaluation of the aorta is limited as timing of the contrast was designed for optimal opacification of the pulmonary arteries. No definite dissection identified. There is no periaortic fluid. There is atherosclerotic calcification of thoracic aorta. No CT evidence of pulmonary embolism. There is no cardiomegaly or significant pericardial effusion. There is coronary vascular calcification. There is calcification of the aortic valves. There is thickened appearance of the left ventricular myocardium, likely myocardial hypertrophy. Mild dilatation of the left atrium. Correlation with echocardiogram recommended. There is retrograde flow of contrast from the right atrium into the IVC compatible with a degree of right cardiac dysfunction. Right hilar and subcarinal adenopathy. The esophagus and the thyroid gland are grossly unremarkable. There is no axillary adenopathy. The chest wall soft tissues appear unremarkable. Old healed bilateral rib fractures. No acute fracture. The visualized upper abdomen appear grossly unremarkable. Review of the MIP images confirms the above findings. IMPRESSION: No CT evidence of pulmonary  embolism. Emphysema with an area of consolidative change in the right lung base compatible with pneumonia. Small right pleural effusion. Diffuse dilatation of the ascending aorta and proximal arch measuring up to 5.2 cm. Evaluation of the aorta is limited on this study as timing of the contrast was designed to opacifying the pulmonary vasculature. No definite dissection identified. CT angiography with optimal opacification of the aorta may provide additional evaluation. Ascending thoracic aortic aneurysm. Recommend semi-annual imaging followup by CTA or MRA and referral to cardiothoracic surgery if not already obtained. This recommendation follows 2010 ACCF/AHA/AATS/ACR/ASA/SCA/SCAI/SIR/STS/SVM Guidelines for the Diagnosis and Management of Patients With Thoracic Aortic Disease. Circulation. 2010; 121: Z610-R604 Calcification of the aortic valves with probable resulting aortic valve stenosis. Dilatation of the ascending aorta may represent post stenotic aneurysm. There is also left ventricular hypertrophy and mild dilatation of the left atrium. Echocardiogram is recommended for further evaluation. Electronically Signed   By: Elgie Collard M.D.   On: 12/26/2015 21:19     Medical Consultants:    None.  Anti-Infectives:   Anti-infectives    Start     Dose/Rate Route Frequency Ordered Stop   12/27/15 2200  cefTRIAXone (ROCEPHIN) 1 g in dextrose 5 % 50 mL IVPB     1 g 100 mL/hr over 30 Minutes Intravenous Every 24 hours 12/26/15 2140     12/27/15 2200  azithromycin (ZITHROMAX) 500 mg in dextrose 5 % 250 mL IVPB     500 mg 250 mL/hr over 60 Minutes Intravenous Every 24 hours 12/26/15 2141     12/26/15 2115  cefTRIAXone (ROCEPHIN) 1 g in dextrose 5 %  50 mL IVPB     1 g 100 mL/hr over 30 Minutes Intravenous  Once 12/26/15 2100 12/26/15 2245   12/26/15 2115  azithromycin (ZITHROMAX) 500 mg in dextrose 5 % 250 mL IVPB     500 mg 250 mL/hr over 60 Minutes Intravenous  Once 12/26/15 2100 12/27/15 0028    12/26/15 2115  oseltamivir (TAMIFLU) capsule 75 mg     75 mg Oral  Once 12/26/15 2100 12/26/15 2155      Subjective:    Craig [name] he relates he continues to have shortness of breath, his chest pain is resolved. He relates he gets chest pain every time he uses inhaler treatments.  Objective:    Filed Vitals:   12/27/15 1610 12/27/15 0636 12/27/15 0640 12/27/15 0747  BP: 141/107 136/102 141/99 117/97  Pulse: 107 53 107 90  Temp:    97.6 F (36.4 C)  TempSrc:    Oral  Resp: Height:      Weight:      SpO2: 97% 98% 97% 94%    Intake/Output Summary (Last 24 hours) at 12/27/15 0813 Last data filed at 12/27/15 0700  Gross per 24 hour  Intake 939.05 ml  Output    425 ml  Net 514.05 ml   Filed Weights   12/27/15 0058  Weight: 65.7 kg (144 lb 13.5 oz)    Exam: Gen:  NAD Cardiovascular:  RRR, No murmur, positive hepatojugular reflux. Chest and lungs:   Poor air movement with crackles on the right no wheezing appreciated. Abdomen:  Abdomen soft, NT/ND, + BS Extremities:  No edema   Data Reviewed:    Labs: Basic Metabolic Panel:  Recent Labs Lab 12/26/15 1731 12/27/15 0220  NA 140 139  K 4.5 4.4  CL 106 106  CO2 22 24  GLUCOSE 115* 147*  BUN 15 15  CREATININE 0.96 0.98  CALCIUM 9.5 8.6*   GFR Estimated Creatinine Clearance: 71.7 mL/min (by C-G formula based on Cr of 0.98). Liver Function Tests: No results for input(s): AST, ALT, ALKPHOS, BILITOT, PROT, ALBUMIN in the last 168 hours. No results for input(s): LIPASE, AMYLASE in the last 168 hours. No results for input(s): AMMONIA in the last 168 hours. Coagulation profile  Recent Labs Lab 12/26/15 2156  INR 1.16    CBC:  Recent Labs Lab 12/26/15 1731 12/27/15 0220  WBC 9.3 8.7  HGB 13.6 11.5*  HCT 40.8 35.3*  MCV 96.9 97.2  PLT 272 230   Cardiac Enzymes:  Recent Labs Lab 12/26/15 2013 12/27/15 0220 12/27/15 0632  TROPONINI 2.32* 1.97* 1.34*   BNP (last 3  results) No results for input(s): PROBNP in the last 8760 hours. CBG: No results for input(s): GLUCAP in the last 168 hours. D-Dimer: No results for input(s): DDIMER in the last 72 hours. Hgb A1c: No results for input(s): HGBA1C in the last 72 hours. Lipid Profile:  Recent Labs  12/27/15 0220  CHOL 157  HDL 30*  LDLCALC 117*  TRIG 48  CHOLHDL 5.2   Thyroid function studies: No results for input(s): TSH, T4TOTAL, T3FREE, THYROIDAB in the last 72 hours.  Invalid input(s): FREET3 Anemia work up: No results for input(s): VITAMINB12, FOLATE, FERRITIN, TIBC, IRON, RETICCTPCT in the last 72 hours. Sepsis Labs:  Recent Labs Lab 12/26/15 1731 12/26/15 2021 12/26/15 2213 12/27/15 0220  WBC 9.3  --   --  8.7  LATICACIDVEN  --  0.88 0.88  --    Microbiology Recent  Results (from the past 240 hour(s))  MRSA PCR Screening     Status: None   Collection Time: 12/27/15 12:52 AM  Result Value Ref Range Status   MRSA by PCR NEGATIVE NEGATIVE Final    Comment:        The GeneXpert MRSA Assay (FDA approved for NASAL specimens only), is one component of a comprehensive MRSA colonization surveillance program. It is not intended to diagnose MRSA infection nor to guide or monitor treatment for MRSA infections.      Medications:   . aspirin  325 mg Oral Daily  . atorvastatin  80 mg Oral q1800  . azithromycin  500 mg Intravenous Q24H  . cefTRIAXone (ROCEPHIN)  IV  1 g Intravenous Q24H  . clopidogrel  75 mg Oral Daily  . ipratropium-albuterol  3 mL Nebulization Q4H  . metoprolol  1.25 mg Intravenous Q12H   Continuous Infusions: . sodium chloride 75 mL/hr at 12/27/15 0100  . heparin 1,000 Units/hr (12/27/15 0313)    Time spent: 35 min   LOS: 1 day   Marinda Elk  [facility] Pager [phone]  *Please refer to [email or web address], password [email or web address] to get updated schedule on who will round on this patient, as hospitalists switch teams weekly. If 7PM-7AM, please contact  night-coverage at [email or web address], password [email or web address] for any overnight needs.  12/27/2015, 8:13 AM

## 2015-12-27 NOTE — Progress Notes (Signed)
Pt's call light on, went and observed pt very SOB, states " I can't breathe". MD notified. New order received. RT notified re need for Bipap and ABG before bipap and 1 hour later.  Pt placed on Bipap for and asked to take it off. Dr, Robb Matar paged re need for anti anxiety med.  Dr. Robb Matar at bedside. Pt given 0.5mg  of ativan. Dr. Robb Matar informed pt of need for Intubation and explained the process. Pt verbalized understanding. Asked about who to notify, stated Dad was in the ED, states call my brother scott. Charge RN attempted to call brother futile, left message on voicemail.  Dr. Vassie Loll at bedside, Pt intubated. See MAR for meds. OGT placed, Cxr and KUB done prior to transfer. Pt's son [name]) updated at bedside. Report called to 70M RN.  Pt transported via bed with belongings.

## 2015-12-27 NOTE — ED Notes (Signed)
Attempted to call report. Rn to call back.

## 2015-12-27 NOTE — Progress Notes (Signed)
ANTICOAGULATION CONSULT NOTE - Follow Up Consult  Pharmacy Consult for Heparin  Indication: chest pain/ACS  No Known Allergies  Patient Measurements: Height:  (175.3 cm) Weight: 144 lb 13.5 oz (65.7 kg) IBW/kg (Calculated) : 70.7  Vital Signs: Temp: 97.4 F (36.3 C) (02/19 0058) Temp Source: Oral (02/19 0058) BP: 106/83 mmHg (02/19 0230) Pulse Rate: 95 (02/19 0230)  Labs:  Recent Labs  12/26/15 1731 12/26/15 2013 12/26/15 2156 12/27/15 0220  HGB 13.6  --   --  11.5*  HCT 40.8  --   --  35.3*  PLT 272  --   --  230  APTT  --   --  32  --   LABPROT  --   --  15.0  --   INR  --   --  1.16  --   HEPARINUNFRC  --   --   --  0.14*  CREATININE 0.96  --   --   --   TROPONINI  --  2.32*  --   --     Estimated Creatinine Clearance: 73.2 mL/min (by C-G formula based on Cr of 0.96).  Assessment: Heparin for elevated troponin, initial heparin level is low at 0.14, no issues per RN.   Goal of Therapy:  Heparin level 0.3-0.7 units/ml Monitor platelets by anticoagulation protocol: Yes   Plan:  -Heparin 1500 units BOLUS -Increase heparin drip to 1000 units/hr -1100 HL  Abran Duke 12/27/2015,3:00 AM

## 2015-12-27 NOTE — Consult Note (Signed)
Cardiology Consult    Patient ID: Craig [name] MRN: [id number], DOB/AGE: [birth year]  Admit date: 12/26/2015 Date of Consult: 12/27/2015 Primary Physician: Rose Fillers, PA-C Requesting Provider:  Della Goo, MD  HPI    65 yo M w a h/o COPD, HTN (by record, though normotensive here, not on treatment), HLD (by record, though lipid panel unremarkable here, not on treatment), & heavy prior alcohol (12 pack per day x 30 years, quit 2004), & tobacco use (~150 year pack history, quit 2010) p/w 1 day of diffuse chest discomfort in the context of 6 days of fevers, coughing productive of "milky phlegm," & dyspnea, found to be hypoxic to 91% in the ED due to pneumonia.  He had associated diaphoresis but denied nausea, palpitations, or lightheadedness.  He woke up with the pain this morning.  He described it as a 3/10 chest discomfort worsened with deep breaths, particularly with movement.  It came every couple of hours & lasted 30-40 minutes at a time.  It resolved in the ED with Morphine, & he was subsequently started on the NTG gtt.  He has never had similar pain.  He denied lower extremity swelling, orthopnea, or paroxysmal nocturnal dyspnea.  At baseline, he walks 1 mile 3 times weekly, which he last did a week prior.    Of note, he does not take medications for his COPD as "nothing seems to help."  He has a PCP Dr. Andrey Campanile who he last saw 10/2015 with complaints of CP with dyspnea, which was attributed to a COPD exacerbation & improved with antibiotics as well as steroids.    While in the ED, CTA was negative for PE.  EKG demonstrated variable sinus tachycardia & ectopic atrial tachycardia, borderline LVH with repolarization, inferior Q waves, transient inferior ST-T abnormalities, & supraventricular as well as ventricular ectopy.  His initial troponin was 0.91 (18:01) followed by 2.32 (20:13).  He was treated with ASA 325, Morphine 4 mg IV x 1, Azithromycin, Ceftriaxone, Oseltimivir,  Duoneb, Clopidogrel 75, NTG gtt, Heparin 4000 x 1 followed by a gtt, & Metoprolol 1.25 mg IV x 1.     Past Medical History   Past Medical History  Diagnosis Date  . COPD (chronic obstructive pulmonary disease) (HCC)   . Hypertension   . Hyperlipidemia   . Alcohol abuse     Prior  . Tobacco abuse     Prior    Past Surgical History  Procedure Laterality Date  . Ruptured disc repair  2009 or 2010    Allergies  No Known Allergies  Inpatient Medications    . aspirin  325 mg Oral Daily  . atorvastatin  80 mg Oral q1800  . azithromycin  500 mg Intravenous Q24H  . cefTRIAXone (ROCEPHIN)  IV  1 g Intravenous Q24H  . clopidogrel  75 mg Oral Daily  . ipratropium-albuterol  3 mL Nebulization Q4H  . metoprolol  1.25 mg Intravenous Q12H   Family History    Family History  Problem Relation Age of Onset  . Diabetes Father    Social History    Social History   Social History  . Marital Status: Single    Spouse Name: N/A  . Number of Children: 3  . Years of Education: GED   Occupational History  . Retired heavy Arboriculturist    Social History Main Topics  . Smoking status: Former Games developer  . Smokeless tobacco: Never Used     Comment: quit 2010 - up to  3 ppd for 50 years  . Alcohol Use: No     Comment: quit 2004 - 12 pack per day for 30 years  . Drug Use: No     Comment: quit 2015  . Sexual Activity: Not on file   Other Topics Concern  . Not on file   Social History Narrative   Lives with son   Drinks one cup of coffee a day and one soda q2-3 days    Review of Systems    General:  No chills, fever, night sweats or weight changes.  Cardiovascular:  Positive for CP.  No dyspnea on exertion, edema, orthopnea, palpitations, paroxysmal nocturnal dyspnea. Dermatological: No rash, lesions/masses Respiratory: Positive for cough.  No dyspnea Urologic: No hematuria, dysuria Abdominal:   No nausea, vomiting, diarrhea, bright red blood per rectum, melena, or  hematemesis Neurologic:  No visual changes, wkns, changes in mental status. All other systems reviewed and are otherwise negative except as noted above.  Physical Exam    Blood pressure 124/100, pulse 111, temperature 97.6 F (36.4 C), temperature source Oral, resp. rate 21, height 5\' 9"  (1.753 m), weight 65.7 kg (144 lb 13.5 oz), SpO2 96 %.  General: Pleasant, NAD Psych: Normal affect. Neuro: Alert and oriented X 3. Moves all extremities spontaneously. HEENT: Normal  Neck: Supple without bruits or JVD. Lungs:  Resp regular and unlabored, CTA. Heart: RRR no s3, s4, or murmurs. Abdomen: Soft, non-tender, non-distended, BS + x 4.  Extremities: No clubbing, cyanosis or edema. DP/PT/Radials 2+ and equal bilaterally.  Labs    Troponin [facility] Test)  Recent Labs  12/26/15 1801  TROPIPOC 0.91*    Recent Labs  12/26/15 2013 12/27/15 0220  TROPONINI 2.32* 1.97*   Lab Results  Component Value Date   WBC 8.7 12/27/2015   HGB 11.5* 12/27/2015   HCT 35.3* 12/27/2015   MCV 97.2 12/27/2015   PLT 230 12/27/2015    Recent Labs Lab 12/27/15 0220  NA 139  K 4.4  CL 106  CO2 24  BUN 15  CREATININE 0.98  CALCIUM 8.6*  GLUCOSE 147*   Lab Results  Component Value Date   CHOL 157 12/27/2015   HDL 30* 12/27/2015   LDLCALC 117* 12/27/2015   TRIG 48 12/27/2015   Lab Results  Component Value Date   DDIMER  05/27/2009    0.36        AT THE INHOUSE ESTABLISHED CUTOFF VALUE OF 0.48 ug/mL FEU, THIS ASSAY HAS BEEN DOCUMENTED IN THE LITERATURE TO HAVE A SENSITIVITY AND NEGATIVE PREDICTIVE VALUE OF AT LEAST 98 TO 99%.  THE TEST RESULT SHOULD BE CORRELATED WITH AN ASSESSMENT OF THE CLINICAL PROBABILITY OF DVT / VTE.   Radiology Studies    Dg Chest 2 View  12/26/2015  CLINICAL DATA:  Shortness of breath yesterday, progressive today. Occasional cough. EXAM: CHEST  2 VIEW COMPARISON:  Most recent chest radiograph 05/27/2009 FINDINGS: Advanced emphysematous change with  hyperinflation. Diffuse increased interstitial markings may reflect superimposed pulmonary edema. Blunting of the costophrenic angles, right greater than left, pleural effusions versus hyperinflation. Patchy airspace disease in the posterior right upper lobe. Heart is normal in size, mediastinal contours are normal. No pneumothorax. Remote bilateral rib fractures. IMPRESSION: 1. Patchy right upper lobe opacity, concerning for pneumonia. Followup PA and lateral chest X-ray is recommended in 3-4 weeks following trial of antibiotic therapy to ensure resolution and exclude underlying malignancy. 2. Advanced emphysema. Increased interstitial markings from prior exam may reflect superimposed edema.  Small pleural effusions versus hyperinflation. Question degree of volume overload/CHF, with concurrent pneumonia, all superimposed on chronic lung disease. Electronically Signed   By: Rubye Oaks M.D.   On: 12/26/2015 18:11   Ct Angio Chest Pe W/cm &/or Wo Cm  12/26/2015  CLINICAL DATA:  65 year old male with shortness of breath copy, tachycardia. EXAM: CT ANGIOGRAPHY CHEST WITH CONTRAST TECHNIQUE: Multidetector CT imaging of the chest was performed using the standard protocol during bolus administration of intravenous contrast. Multiplanar CT image reconstructions and MIPs were obtained to evaluate the vascular anatomy. CONTRAST:  80mL OMNIPAQUE IOHEXOL 350 MG/ML SOLN COMPARISON:  Radiograph dated 12/26/2015 FINDINGS: There is emphysematous changes of the lungs. Patchy area of consolidative changes at the right lung base as well as area of interstitial thickening in the right apical region are concerning for pneumonia. There is a small right pleural effusion. No pneumothorax. The central airways are patent. There is mild bronchiectatic changes with mild thickening of the bronchial wall. There is diffuse dilatation of the ascending aorta with involvement of the proximal arch measuring up to 5.2 cm in diameter.  Evaluation of the aorta is limited as timing of the contrast was designed for optimal opacification of the pulmonary arteries. No definite dissection identified. There is no periaortic fluid. There is atherosclerotic calcification of thoracic aorta. No CT evidence of pulmonary embolism. There is no cardiomegaly or significant pericardial effusion. There is coronary vascular calcification. There is calcification of the aortic valves. There is thickened appearance of the left ventricular myocardium, likely myocardial hypertrophy. Mild dilatation of the left atrium. Correlation with echocardiogram recommended. There is retrograde flow of contrast from the right atrium into the IVC compatible with a degree of right cardiac dysfunction. Right hilar and subcarinal adenopathy. The esophagus and the thyroid gland are grossly unremarkable. There is no axillary adenopathy. The chest wall soft tissues appear unremarkable. Old healed bilateral rib fractures. No acute fracture. The visualized upper abdomen appear grossly unremarkable. Review of the MIP images confirms the above findings. IMPRESSION: No CT evidence of pulmonary embolism. Emphysema with an area of consolidative change in the right lung base compatible with pneumonia. Small right pleural effusion. Diffuse dilatation of the ascending aorta and proximal arch measuring up to 5.2 cm. Evaluation of the aorta is limited on this study as timing of the contrast was designed to opacifying the pulmonary vasculature. No definite dissection identified. CT angiography with optimal opacification of the aorta may provide additional evaluation. Ascending thoracic aortic aneurysm. Recommend semi-annual imaging followup by CTA or MRA and referral to cardiothoracic surgery if not already obtained. This recommendation follows 2010 ACCF/AHA/AATS/ACR/ASA/SCA/SCAI/SIR/STS/SVM Guidelines for the Diagnosis and Management of Patients With Thoracic Aortic Disease. Circulation. 2010; 121:  Z610-R604 Calcification of the aortic valves with probable resulting aortic valve stenosis. Dilatation of the ascending aorta may represent post stenotic aneurysm. There is also left ventricular hypertrophy and mild dilatation of the left atrium. Echocardiogram is recommended for further evaluation. Electronically Signed   By: Elgie Collard M.D.   On: 12/26/2015 21:19   Assessment & Plan    A/P:  65 yo M w a h/o COPD, HTN, HLD, & heavy prior alcohol, & tobacco use p/w 1 day of diffuse chest discomfort in the context of 6 days of fevers, coughing productive of "milky phlegm," & dyspnea, found to be hypoxic to 91% in the ED due to pneumonia, found to have elevated troponin.  # Troponin elevation & CP in the setting of PNA - He also had transient  inferior ST-T abnormalities on his EKG.  This most likely due to demand ischemia in the setting of hypoxia, but he is certainly at risk for ischemic heart disease with his age & extensive smoking history. His diagnosis of HTN & HLD are a little less clear.  - We agree with monitoring him on telemetry with serial cardiac enzymes.   - It is unlikely that he needs the NTG gtt as his CP resolved prior to its initiation. I would be interested to see his response to turning it off, particularly since it is at such a low dose.  If pain recurs, this could be restarted.   - We will plan to start with a resting echocardiogram in the morning to rule out wall motion abnormalities. - Depending on the results of the echocardiogram as well as the trend of his symptoms & cardiac enzymes, we will further determine if formal ischemic workup is warranted this admission.   - In the meantime, ASA, Heparin, & Metoprolol are reasonable. - As he was already given Clopidogrel 75 mg, we will give an additional 225 mg now for a total loading dose of 300 mg.   - We will also initiate high-dose Atorvastatin.    # Ascending aortic aneurysm - No evidence of dissection on imaging. - This  will need to be monitored on an outpatient basis after discharge.  # h/o HTN - Normotensive here. - We agree with initiation of Metoprolol.  # h/o HLD - His LDL is 141.  Based on his 10-Year ASCVD risk of 13.3%, he warrants moderate-high dose statin. - We will initiate Atorvastatin 80 mg daily.    Signed, Julaine Hua, MD 12/27/2015, 6:30 AM

## 2015-12-27 NOTE — Progress Notes (Signed)
Pt self extubated. E Link called. MD notified. Pt on venturi mask 8 Lpm 40% with a sat of 93% & resting comfortably.

## 2015-12-28 ENCOUNTER — Inpatient Hospital Stay (HOSPITAL_COMMUNITY): Payer: Medicaid Other

## 2015-12-28 DIAGNOSIS — J9601 Acute respiratory failure with hypoxia: Secondary | ICD-10-CM

## 2015-12-28 DIAGNOSIS — R079 Chest pain, unspecified: Secondary | ICD-10-CM

## 2015-12-28 LAB — CBC
HEMATOCRIT: 36.4 % — AB (ref 39.0–52.0)
Hemoglobin: 11.9 g/dL — ABNORMAL LOW (ref 13.0–17.0)
MCH: 31.9 pg (ref 26.0–34.0)
MCHC: 32.7 g/dL (ref 30.0–36.0)
MCV: 97.6 fL (ref 78.0–100.0)
Platelets: 249 10*3/uL (ref 150–400)
RBC: 3.73 MIL/uL — ABNORMAL LOW (ref 4.22–5.81)
RDW: 12.5 % (ref 11.5–15.5)
WBC: 7.8 10*3/uL (ref 4.0–10.5)

## 2015-12-28 LAB — BASIC METABOLIC PANEL
Anion gap: 10 (ref 5–15)
BUN: 21 mg/dL — AB (ref 6–20)
CHLORIDE: 104 mmol/L (ref 101–111)
CO2: 23 mmol/L (ref 22–32)
Calcium: 8.9 mg/dL (ref 8.9–10.3)
Creatinine, Ser: 1.05 mg/dL (ref 0.61–1.24)
GFR calc Af Amer: >60 mL/min (ref >60)
GFR calc non Af Amer: >60 mL/min (ref >60)
Glucose, Bld: 184 mg/dL — ABNORMAL HIGH (ref 65–99)
POTASSIUM: 4.9 mmol/L (ref 3.5–5.1)
SODIUM: 137 mmol/L (ref 135–145)

## 2015-12-28 LAB — MAGNESIUM: MAGNESIUM: 2.2 mg/dL (ref 1.7–2.4)

## 2015-12-28 LAB — PHOSPHORUS: PHOSPHORUS: 2.9 mg/dL (ref 2.5–4.6)

## 2015-12-28 LAB — HEPARIN LEVEL (UNFRACTIONATED): HEPARIN UNFRACTIONATED: 0.53 [IU]/mL (ref 0.30–0.70)

## 2015-12-28 LAB — TROPONIN I: Troponin I: 0.83 ng/mL (ref <0.031)

## 2015-12-28 MED ORDER — HEPARIN BOLUS VIA INFUSION
4000.0000 [IU] | Freq: Once | INTRAVENOUS | Status: AC
  Administered 2015-12-28: 4000 [IU] via INTRAVENOUS
  Filled 2015-12-28: qty 4000

## 2015-12-28 MED ORDER — METOPROLOL TARTRATE 1 MG/ML IV SOLN
5.0000 mg | Freq: Three times a day (TID) | INTRAVENOUS | Status: DC | PRN
  Administered 2015-12-28: 5 mg via INTRAVENOUS
  Filled 2015-12-28: qty 5

## 2015-12-28 MED ORDER — METOPROLOL TARTRATE 1 MG/ML IV SOLN: 5.0000 mg | Freq: Three times a day (TID) | INTRAVENOUS | Status: DC

## 2015-12-28 MED ORDER — HEPARIN (PORCINE) IN NACL 100-0.45 UNIT/ML-% IJ SOLN
900.0000 [IU]/h | INTRAMUSCULAR | Status: DC
  Administered 2015-12-28: 900 [IU]/h via INTRAVENOUS
  Filled 2015-12-28 (×4): qty 250

## 2015-12-28 MED ORDER — METHYLPREDNISOLONE SODIUM SUCC 40 MG IJ SOLR
40.0000 mg | Freq: Three times a day (TID) | INTRAMUSCULAR | Status: DC
  Administered 2015-12-28 – 2015-12-30 (×4): 40 mg via INTRAVENOUS
  Filled 2015-12-28 (×6): qty 1

## 2015-12-28 NOTE — Progress Notes (Signed)
ICU UR Completed. Abad Manard, RN, BSN.  [phone]  

## 2015-12-28 NOTE — Progress Notes (Signed)
     SUBJECTIVE: Pt has no chest pain today. Self extubated this am.   Tele: sinus tach  BP 112/93 mmHg  Pulse 113  Temp(Src) 97.7 F (36.5 C) (Oral)  Resp 19  Ht  (1.753 m)  Wt 144 lb 13.5 oz (65.7 kg)  BMI 21.38 kg/m2  SpO2 96%  Intake/Output Summary (Last 24 hours) at 12/28/15 1235 Last data filed at 12/28/15 0800  Gross per 24 hour  Intake    535 ml  Output    300 ml  Net    235 ml    PHYSICAL EXAM General: Well developed, well nourished, in no acute distress.  Psych:  Good affect, responds appropriately Neck: No JVD. No masses noted.  Lungs: Clear bilaterally with no wheezes or rhonci noted.  Heart: Tachy with no murmurs noted. Abdomen: Bowel sounds are present. Soft, non-tender.  Extremities: No lower extremity edema.   LABS: Basic Metabolic Panel:  Recent Labs  16/10/96 0220 12/28/15 0238  NA 139 137  K 4.4 4.9  CL 106 104  CO2 24 23  GLUCOSE 147* 184*  BUN 15 21*  CREATININE 0.98 1.05  CALCIUM 8.6* 8.9   CBC:  Recent Labs  12/27/15 0220 12/28/15 0238  WBC 8.7 7.8  HGB 11.5* 11.9*  HCT 35.3* 36.4*  MCV 97.2 97.6  PLT 230 249   Cardiac Enzymes:  Recent Labs  12/27/15 1703 12/27/15 2015 12/28/15 0238  TROPONINI 1.22* 0.92* 0.83*   Fasting Lipid Panel:  Recent Labs  12/27/15 0220  CHOL 157  HDL 30*  LDLCALC 117*  TRIG 48  CHOLHDL 5.2    Current Meds: . antiseptic oral rinse  7 mL Mouth Rinse BID  . atorvastatin  80 mg Oral q1800  . azithromycin  500 mg Intravenous Q24H  . cefTRIAXone (ROCEPHIN)  IV  1 g Intravenous Q24H  . clopidogrel  75 mg Oral Daily  . fentaNYL (SUBLIMAZE) injection  50 mcg Intravenous Once  . heparin subcutaneous  5,000 Units Subcutaneous 3 times per day  . ipratropium-albuterol  3 mL Nebulization Q4H  . methylPREDNISolone (SOLU-MEDROL) injection  80 mg Intravenous 3 times per day  . pantoprazole (PROTONIX) IV  40 mg Intravenous Q24H     ASSESSMENT AND PLAN:  1. NSTEMI/Respiratory failure:  Pt admitted with chest pain, SOB and elevated troponin. He was intubated 12/27/15 due to worsened respiratory status but self extubated this am. Chest x-ray improved today. EKG with non-specific T wave inversions. I have personally reviewed this EKG. Echo is pending today. Given his presentation and findings, I think cardiac cath is indicated. He is currently on Plavix and a statin. He is being treated with broad spectrum antibiotics for possible pneumonia. I think he would benefit from IV heparin while awaiting cath given the elevated troponin. Will start IV heparin now. NPO at midnight for cath around 1pm with Dr. [place] tomorrow. Cath orders placed. Risks and benefits of procedure reviewed with pt and he agrees to proceed.   Verania Salberg  2/20/201712:35 PM

## 2015-12-28 NOTE — Progress Notes (Signed)
ANTICOAGULATION CONSULT NOTE - Initial Consult  Pharmacy Consult for heparin Indication: chest pain/ACS  No Known Allergies  Patient Measurements: Height:  (175.3 cm) Weight: 144 lb 13.5 oz (65.7 kg) IBW/kg (Calculated) : 70.7 Heparin Dosing Weight: 65.7 kg  Vital Signs: Temp: 97.7 F (36.5 C) (02/20 1230) Temp Source: Oral (02/20 1230) BP: 112/93 mmHg (02/20 0800) Pulse Rate: 113 (02/20 0800)  Labs:  Recent Labs  12/26/15 1731  12/26/15 2156 12/27/15 0220  12/27/15 1032 12/27/15 1703 12/27/15 2015 12/28/15 0238  HGB 13.6  --   --  11.5*  --   --   --   --  11.9*  HCT 40.8  --   --  35.3*  --   --   --   --  36.4*  PLT 272  --   --  230  --   --   --   --  249  APTT  --   --  32  --   --   --   --   --   --   LABPROT  --   --  15.0  --   --   --   --   --   --   INR  --   --  1.16  --   --   --   --   --   --   HEPARINUNFRC  --   --   --  0.14*  --  0.13*  --   --   --   CREATININE 0.96  --   --  0.98  --   --   --   --  1.05  TROPONINI  --   < >  --  1.97*  < > 1.17* 1.22* 0.92* 0.83*  < > = values in this interval not displayed.  Estimated Creatinine Clearance: 66.9 mL/min (by C-G formula based on Cr of 1.05).   Medical History: Past Medical History  Diagnosis Date  . COPD (chronic obstructive pulmonary disease) (HCC)   . Hypertension   . Hyperlipidemia   . Alcohol abuse     Prior  . Tobacco abuse     Prior    Medications:  No prescriptions prior to admission    Assessment: 65 yo male admitted 2/18 with chest pain and positive troponin. Starting heparin gtt for NSTEMI, pt going to cath tomorrow. CBC is stable. Pt received heparin sq 5000 units at 0500 today.  Goal of Therapy:  Heparin level 0.3-0.7 units/ml Monitor platelets by anticoagulation protocol: Yes   Plan:  -Heparin 4000 units x1 then 900 units/hr -Daily HL, CBC -Check level this evening -F/u after cath tomorrow   Gregery Walberg, Darl Householder 12/28/2015,2:35 PM

## 2015-12-28 NOTE — Progress Notes (Signed)
ANTICOAGULATION CONSULT NOTE - Initial Consult  Pharmacy Consult for heparin Indication: chest pain/ACS  No Known Allergies  Patient Measurements: Height:  (175.3 cm) Weight: 144 lb 13.5 oz (65.7 kg) IBW/kg (Calculated) : 70.7 Heparin Dosing Weight: 65.7 kg  Vital Signs: Temp: 97.7 F (36.5 C) (02/20 2014) Temp Source: Axillary (02/20 2014) BP: 123/99 mmHg (02/20 2000) Pulse Rate: 124 (02/20 2000)  Labs:  Recent Labs  12/26/15 1731  12/26/15 2156 12/27/15 0220  12/27/15 1032 12/27/15 1703 12/27/15 2015 12/28/15 0238 12/28/15 2055  HGB 13.6  --   --  11.5*  --   --   --   --  11.9*  --   HCT 40.8  --   --  35.3*  --   --   --   --  36.4*  --   PLT 272  --   --  230  --   --   --   --  249  --   APTT  --   --  32  --   --   --   --   --   --   --   LABPROT  --   --  15.0  --   --   --   --   --   --   --   INR  --   --  1.16  --   --   --   --   --   --   --   HEPARINUNFRC  --   --   --  0.14*  --  0.13*  --   --   --  0.53  CREATININE 0.96  --   --  0.98  --   --   --   --  1.05  --   TROPONINI  --   < >  --  1.97*  < > 1.17* 1.22* 0.92* 0.83*  --   < > = values in this interval not displayed.  Estimated Creatinine Clearance: 66.9 mL/min (by C-G formula based on Cr of 1.05).   Medical History: Past Medical History  Diagnosis Date  . COPD (chronic obstructive pulmonary disease) (HCC)   . Hypertension   . Hyperlipidemia   . Alcohol abuse     Prior  . Tobacco abuse     Prior    Medications:  No prescriptions prior to admission    Assessment: 65 yo male admitted 2/18 with chest pain and positive troponin. Starting heparin gtt for NSTEMI, pt going to cath tomorrow. CBC is stable. Pt received heparin sq 5000 units at 0500 today. First HL is therapeutic at 0.53.  Goal of Therapy:  Heparin level 0.3-0.7 units/ml Monitor platelets by anticoagulation protocol: Yes   Plan:  Continue heparin gtt at 900 units/hr Check 6 hr confirmatory HL  Monitor daily  HL, CBC, s/s of bleed F/u after cath tomorrow  Enzo Bi, PharmD, BCPS Clinical Pharmacist Pager [phone] 12/28/2015 9:22 PM

## 2015-12-28 NOTE — Care Management Note (Addendum)
Case Management Note  Patient Details  Name: Craig [name] MRN: [id number] Date of Birth: [birth year]  Subjective/Objective:        Pt admitted with Acute Respiratory Failure -  Pt is now extubated on [place]     Action/Plan:  Pt is independent from home with son.  CM will continue to monitor for disposition needs   Expected Discharge Date:                  Expected Discharge Plan:  Home/Self Care  In-House Referral:     Discharge planning Services  CM Consult  Post Acute Care Choice:    Choice offered to:     DME Arranged:    DME Agency:     HH Arranged:    HH Agency:     Status of Service:  In process, will continue to follow  Medicare Important Message Given:    Date Medicare IM Given:    Medicare IM give by:    Date Additional Medicare IM Given:    Additional Medicare Important Message give by:     If discussed at Long Length of Stay Meetings, dates discussed:    Additional Comments:  Cherylann Parr, RN 12/28/2015, 1:42 PM

## 2015-12-28 NOTE — Progress Notes (Signed)
  Echocardiogram 2D Echocardiogram has been performed.  Janalyn Harder 12/28/2015, 3:04 PM

## 2015-12-28 NOTE — Progress Notes (Signed)
I have looked the patient's echo. I'm not able to find it. I called the echo lab and requested that it be done today.  Jerral Bonito, MD

## 2015-12-28 NOTE — Progress Notes (Signed)
PULMONARY / CRITICAL CARE MEDICINE   Name: Craig [name] MRN: [id number] DOB: 10-22-1951    ADMISSION DATE:  12/26/2015 CONSULTATION DATE:  12/27/15  REFERRING MD:  Dr. David Stall  CHIEF COMPLAINT:  Acute Hypoxic Respiratory Failure  HISTORY OF PRESENT ILLNESS:   65 y/o M with PMH of HTN, HLD, ETOH abuse, tobacco abuse and COPD who presented to [facility] on 12/26/15 with a 24 hour history of worsening shortness of breath, cough, chest tightness, fevers/chilld, and myalgias, RA saturation of 91%.  T CTA of the chest which was negative for PE but demonstrated emphysema, RUL consolidation, small R pleural effusion, dilation of ascending aortia, proximal arch measuring up to 5.2 cm without dissection, and ascending thoracic aortic aneurysm.    The patient was admitted per [facility] for community acquired PNA.  He was treated with IV rocephin / azithromycin.  Due to elevated troponin, Cardiology was consulted for evaluation.  He was placed on a heparin gtt, nitro gtt, plavix, asa, and lopressor.  Cardiology evaluation felt elevated troponin likely due to demand ischemia in the setting of PNA with underlying severe emphysema.    On 2/19, the patient had progressive respiratory distress and hypoxemia.    Intubated and transferred to ICU.       SUBJECTIVE:  denies CP breathing better 'since tube came out'   VITAL SIGNS: BP 112/93 mmHg  Pulse 113  Temp(Src) 97.7 F (36.5 C) (Oral)  Resp 19  Ht  (1.753 m)  Wt 65.7 kg (144 lb 13.5 oz)  BMI 21.38 kg/m2  SpO2 96%  HEMODYNAMICS:    VENTILATOR SETTINGS: Vent Mode:  [-] PRVC FiO2 (%):  [40 %-70 %] 40 % Set Rate:  [18 bmp] 18 bmp Vt Set:  [560 mL] 560 mL PEEP:  [5 cmH20] 5 cmH20 Plateau Pressure:  [14 cmH20] 14 cmH20  INTAKE / OUTPUT: I/O last 3 completed shifts: In: 2030.2 [P.O.:480; I.V.:1250.2; IV Piggyback:300] Out: 725 [Urine:725]  PHYSICAL EXAMINATION: General:  Thin adult male Neuro:  Awake, interactive, pupils 26mm=R HEENT:   MM pink/moist Cardiovascular:  s1s2 rrr, no m/r/g  Lungs:  Less labored , lungs decreased bilaterally  Abdomen:  Soft, bsx4 active Musculoskeletal:  No acute deformities  Skin:  Diaphoretic, warm, no edema, rashes or lesions  LABS:  BMET  Recent Labs Lab 12/26/15 1731 12/27/15 0220 12/28/15 0238  NA 140 139 137  K 4.5 4.4 4.9  CL 106 106 104  CO2 BUN 15 15 21*  CREATININE 0.96 0.98 1.05  GLUCOSE 115* 147* 184*    Electrolytes  Recent Labs Lab 12/26/15 1731 12/27/15 0220 12/28/15 0238  CALCIUM 9.5 8.6* 8.9    CBC  Recent Labs Lab 12/26/15 1731 12/27/15 0220 12/28/15 0238  WBC 9.3 8.7 7.8  HGB 13.6 11.5* 11.9*  HCT 40.8 35.3* 36.4*  PLT 272 230 249    Coag's  Recent Labs Lab 12/26/15 2156  APTT 32  INR 1.16    Sepsis Markers  Recent Labs Lab 12/26/15 2021 12/26/15 2213 12/27/15 1212  LATICACIDVEN 0.88 0.88 3.7*    ABG  Recent Labs Lab 12/27/15 1158 12/27/15 1420 12/27/15 1855  PHART 7.229* 7.158* 7.330*  PCO2ART 47.2* 64.7* 38.3  PO2ART 53.0* 185* 61.7*    Liver Enzymes No results for input(s): AST, ALT, ALKPHOS, BILITOT, ALBUMIN in the last 168 hours.  Cardiac Enzymes  Recent Labs Lab 12/27/15 1703 12/27/15 2015 12/28/15 0238  TROPONINI 1.22* 0.92* 0.83*    Glucose  Recent Labs  Lab 12/27/15 0831 12/27/15 1142 12/27/15 1748  GLUCAP 109* 221* 145*    Imaging Dg Chest Port 1 View  12/28/2015  CLINICAL DATA:  Acute respiratory failure. The patient extubated himself yesterday. EXAM: PORTABLE CHEST 1 VIEW COMPARISON:  Yesterday. FINDINGS: The cardiac silhouette remains borderline enlarged. Decreased bilateral airspace opacity. Stable bilateral pleural effusions and prominence of the pulmonary vasculature and interstitial markings. No acute bony abnormality. IMPRESSION: 1. Improving congestive heart failure superimposed on COPD. 2. Persistent probable atelectasis at the right lung base and mild left basilar  atelectasis. Electronically Signed   By: Beckie Salts M.D.   On: 12/28/2015 07:14     STUDIES:  2/18  CTA Chest >> negative for PE, emphysema, RUL consolidation, small R pleural effusion, dilation of ascending aortia, proximal arch measuring up to 5.2 cm without dissection, and ascending thoracic aortic aneurysm.   CULTURES: Tracheal Aspirate 2/19 >> ng Flu 2/18 >> negative   ANTIBIOTICS: Rocephin 2/18 >>  Azithromycin 2/18 >>  Tamiflu 2/18 >>   SIGNIFICANT EVENTS: 2/18  Admit with SOB, RUL PNA.  PE ruled out.  Elevated trop leak thought demand ischemia.   2/19  Decompensated requiring intubation due to resp distress  2/19 self extubated  LINES/TUBES: ETT 2/19 >> 2/19  DISCUSSION: 65 y/o M with PMH of emphysema / COPD admitted 2/18 with increased SOB, chest tightness.  Found to have RLL PNA.  Flu negative. Elevated troponin in setting of demand ischemia.  Decompensated on 2/19 requiring brief  intubation.    ASSESSMENT / PLAN:  PULMONARY A: Acute Hypoxic Respiratory Failure - in the setting of RLL PNA, Severe Emphysema with acute exacerbation, possible component of pulmonary edema. Flu negative PCR.  Acute Exacerbation of COPD  Hx Tobacco Abuse  P:   Duoneb Q6 with Q3 PRN albuterol  decrease solumedrol to  IV Q8    CARDIOVASCULAR A:  Troponin Leak - likely demand ischemia  Favor CHF   P:  Cardiology following, discussed - suggest early cath D/C lopressor with mild hypotension.  If needs to be restarted, consider bisoprolol Await ECHO  Continue lipitor, ASA   RENAL A:   Lactic Acidosis - suspect in setting of respiratory distress At Risk AKI - in setting of hypotension  P:   Trend BMP / UOP  Replace electrolytes as indicated    INFECTIOUS A:   RUL PNA (NOS)  P:   Empiric tamiflu as above Await RVP Droplet precautions   Summary - Improved, concern for Acute coronary syndrome as cause for acute pulmonary edema and Sudden decompensation   Cyril Mourning MD. FCCP. [facility] Pager [phone] If no response call [phone]   12/28/2015, 2:29 PM

## 2015-12-29 ENCOUNTER — Other Ambulatory Visit (HOSPITAL_COMMUNITY): Payer: Self-pay

## 2015-12-29 ENCOUNTER — Encounter (HOSPITAL_COMMUNITY)
Admission: EM | Disposition: A | Discharge status: Rehabilitation Facility | Payer: Self-pay | Source: Home / Self Care | Attending: Surgery

## 2015-12-29 ENCOUNTER — Other Ambulatory Visit: Payer: Self-pay | Admitting: *Deleted

## 2015-12-29 DIAGNOSIS — J449 Chronic obstructive pulmonary disease, unspecified: Secondary | ICD-10-CM

## 2015-12-29 DIAGNOSIS — I350 Nonrheumatic aortic (valve) stenosis: Secondary | ICD-10-CM

## 2015-12-29 DIAGNOSIS — I420 Dilated cardiomyopathy: Secondary | ICD-10-CM

## 2015-12-29 DIAGNOSIS — I214 Non-ST elevation (NSTEMI) myocardial infarction: Principal | ICD-10-CM

## 2015-12-29 DIAGNOSIS — I2510 Atherosclerotic heart disease of native coronary artery without angina pectoris: Secondary | ICD-10-CM

## 2015-12-29 HISTORY — PX: CARDIAC CATHETERIZATION: SHX172

## 2015-12-29 LAB — RESPIRATORY VIRUS PANEL
ADENOVIRUS: NEGATIVE
INFLUENZA A: NEGATIVE
INFLUENZA B 1: NEGATIVE
Metapneumovirus: NEGATIVE
PARAINFLUENZA 3 A: NEGATIVE
Parainfluenza 1: NEGATIVE
Parainfluenza 2: NEGATIVE
RESPIRATORY SYNCYTIAL VIRUS A: NEGATIVE
Respiratory Syncytial Virus B: NEGATIVE
Rhinovirus: NEGATIVE

## 2015-12-29 LAB — POCT I-STAT 3, ART BLOOD GAS (G3+)
ACID-BASE DEFICIT: 1 mmol/L (ref 0.0–2.0)
Bicarbonate: 23.3 mEq/L (ref 20.0–24.0)
O2 Saturation: 90 %
PH ART: 7.409 (ref 7.350–7.450)
TCO2: 24 mmol/L (ref 0–100)
pCO2 arterial: 36.8 mmHg (ref 35.0–45.0)
pO2, Arterial: 58 mmHg — ABNORMAL LOW (ref 80.0–100.0)

## 2015-12-29 LAB — CBC
HCT: 35.9 % — ABNORMAL LOW (ref 39.0–52.0)
HCT: 40.3 % (ref 39.0–52.0)
HEMATOCRIT: 34.4 % — AB (ref 39.0–52.0)
HEMOGLOBIN: 11.7 g/dL — AB (ref 13.0–17.0)
HEMOGLOBIN: 11.6 g/dL — AB (ref 13.0–17.0)
Hemoglobin: 13.4 g/dL (ref 13.0–17.0)
MCH: 31.7 pg (ref 26.0–34.0)
MCH: 33 pg (ref 26.0–34.0)
MCH: 32.4 pg (ref 26.0–34.0)
MCHC: 32.6 g/dL (ref 30.0–36.0)
MCHC: 33.7 g/dL (ref 30.0–36.0)
MCHC: 33.3 g/dL (ref 30.0–36.0)
MCV: 97.3 fL (ref 78.0–100.0)
MCV: 98 fL (ref 78.0–100.0)
MCV: 97.3 fL (ref 78.0–100.0)
PLATELETS: 281 10*3/uL (ref 150–400)
PLATELETS: 336 10*3/uL (ref 150–400)
Platelets: 251 10*3/uL (ref 150–400)
RBC: 3.69 MIL/uL — AB (ref 4.22–5.81)
RBC: 3.51 MIL/uL — AB (ref 4.22–5.81)
RBC: 4.14 MIL/uL — AB (ref 4.22–5.81)
RDW: 13 % (ref 11.5–15.5)
RDW: 13.1 % (ref 11.5–15.5)
RDW: 13.1 % (ref 11.5–15.5)
WBC: 15.4 10*3/uL — ABNORMAL HIGH (ref 4.0–10.5)
WBC: 12.6 10*3/uL — AB (ref 4.0–10.5)
WBC: 14.7 10*3/uL — AB (ref 4.0–10.5)

## 2015-12-29 LAB — BASIC METABOLIC PANEL
ANION GAP: 9 (ref 5–15)
Anion gap: 10 (ref 5–15)
BUN: 27 mg/dL — ABNORMAL HIGH (ref 6–20)
BUN: 25 mg/dL — AB (ref 6–20)
CALCIUM: 9 mg/dL (ref 8.9–10.3)
CHLORIDE: 103 mmol/L (ref 101–111)
CO2: 25 mmol/L (ref 22–32)
CO2: 25 mmol/L (ref 22–32)
CREATININE: 1.1 mg/dL (ref 0.61–1.24)
Calcium: 9 mg/dL (ref 8.9–10.3)
Chloride: 105 mmol/L (ref 101–111)
Creatinine, Ser: 1.02 mg/dL (ref 0.61–1.24)
GFR calc Af Amer: >60 mL/min (ref >60)
GFR calc non Af Amer: >60 mL/min (ref >60)
GFR calc non Af Amer: >60 mL/min (ref >60)
GLUCOSE: 150 mg/dL — AB (ref 65–99)
GLUCOSE: 150 mg/dL — AB (ref 65–99)
POTASSIUM: 4.6 mmol/L (ref 3.5–5.1)
Potassium: 4.6 mmol/L (ref 3.5–5.1)
Sodium: 138 mmol/L (ref 135–145)
Sodium: 139 mmol/L (ref 135–145)

## 2015-12-29 LAB — POCT I-STAT 3, VENOUS BLOOD GAS (G3P V)
ACID-BASE DEFICIT: 1 mmol/L (ref 0.0–2.0)
Bicarbonate: 25.1 mEq/L — ABNORMAL HIGH (ref 20.0–24.0)
O2 Saturation: 57 %
PO2 VEN: 31 mmHg (ref 30.0–45.0)
TCO2: 26 mmol/L (ref 0–100)
pCO2, Ven: 44.5 mmHg — ABNORMAL LOW (ref 45.0–50.0)
pH, Ven: 7.359 — ABNORMAL HIGH (ref 7.250–7.300)

## 2015-12-29 LAB — CULTURE, RESPIRATORY: CULTURE: NORMAL

## 2015-12-29 LAB — CREATININE, SERUM: CREATININE: 1.03 mg/dL (ref 0.61–1.24)

## 2015-12-29 LAB — HEPARIN LEVEL (UNFRACTIONATED): Heparin Unfractionated: 0.3 IU/mL (ref 0.30–0.70)

## 2015-12-29 LAB — CULTURE, RESPIRATORY W GRAM STAIN

## 2015-12-29 LAB — PROTIME-INR
INR: 1.15 (ref 0.00–1.49)
Prothrombin Time: 14.9 seconds (ref 11.6–15.2)

## 2015-12-29 LAB — POCT ACTIVATED CLOTTING TIME: ACTIVATED CLOTTING TIME: 121 s

## 2015-12-29 SURGERY — RIGHT/LEFT HEART CATH AND CORONARY ANGIOGRAPHY

## 2015-12-29 MED ORDER — HEPARIN (PORCINE) IN NACL 2-0.9 UNIT/ML-% IJ SOLN
INTRAMUSCULAR | Status: DC | PRN
  Administered 2015-12-29: 1500 mL

## 2015-12-29 MED ORDER — SODIUM CHLORIDE 0.9 % IV SOLN: INTRAVENOUS | Status: DC

## 2015-12-29 MED ORDER — LIDOCAINE HCL (PF) 1 % IJ SOLN
INTRAMUSCULAR | Status: DC | PRN
  Administered 2015-12-29: 14 mL

## 2015-12-29 MED ORDER — SODIUM CHLORIDE 0.9% FLUSH
3.0000 mL | Freq: Two times a day (BID) | INTRAVENOUS | Status: DC
  Administered 2015-12-29 – 2016-01-06 (×16): 3 mL via INTRAVENOUS

## 2015-12-29 MED ORDER — MIDAZOLAM HCL 2 MG/2ML IJ SOLN
INTRAMUSCULAR | Status: DC | PRN
  Administered 2015-12-29: 1 mg via INTRAVENOUS

## 2015-12-29 MED ORDER — SODIUM CHLORIDE 0.9 % IV SOLN: 250.0000 mL | INTRAVENOUS | Status: DC | PRN

## 2015-12-29 MED ORDER — SODIUM CHLORIDE 0.9% FLUSH: 3.0000 mL | INTRAVENOUS | Status: DC | PRN

## 2015-12-29 MED ORDER — HEPARIN (PORCINE) IN NACL 2-0.9 UNIT/ML-% IJ SOLN
INTRAMUSCULAR | Status: AC
  Filled 2015-12-29: qty 1000

## 2015-12-29 MED ORDER — SODIUM CHLORIDE 0.9% FLUSH: 3.0000 mL | Freq: Two times a day (BID) | INTRAVENOUS | Status: DC

## 2015-12-29 MED ORDER — LIDOCAINE HCL (PF) 1 % IJ SOLN
INTRAMUSCULAR | Status: AC
  Filled 2015-12-29: qty 30

## 2015-12-29 MED ORDER — FUROSEMIDE 10 MG/ML IJ SOLN
80.0000 mg | Freq: Once | INTRAMUSCULAR | Status: AC
  Administered 2015-12-29: 80 mg via INTRAVENOUS

## 2015-12-29 MED ORDER — MIDAZOLAM HCL 2 MG/2ML IJ SOLN
INTRAMUSCULAR | Status: AC
Start: 2015-12-29 — End: 2015-12-29
  Filled 2015-12-29: qty 2

## 2015-12-29 MED ORDER — ASPIRIN 81 MG PO CHEW
81.0000 mg | CHEWABLE_TABLET | ORAL | Status: AC
  Administered 2015-12-29: 81 mg via ORAL
  Filled 2015-12-29: qty 1

## 2015-12-29 MED ORDER — FENTANYL CITRATE (PF) 100 MCG/2ML IJ SOLN
INTRAMUSCULAR | Status: AC
  Filled 2015-12-29: qty 2

## 2015-12-29 MED ORDER — SODIUM CHLORIDE 0.9% FLUSH
3.0000 mL | Freq: Two times a day (BID) | INTRAVENOUS | Status: DC
  Administered 2015-12-29: 3 mL via INTRAVENOUS

## 2015-12-29 MED ORDER — FENTANYL CITRATE (PF) 100 MCG/2ML IJ SOLN
INTRAMUSCULAR | Status: DC | PRN
  Administered 2015-12-29: 25 ug via INTRAVENOUS

## 2015-12-29 MED ORDER — ASPIRIN 81 MG PO CHEW: 81.0000 mg | CHEWABLE_TABLET | ORAL | Status: DC

## 2015-12-29 MED ORDER — FUROSEMIDE 10 MG/ML IJ SOLN
INTRAMUSCULAR | Status: AC
  Filled 2015-12-29: qty 8

## 2015-12-29 MED ORDER — ENOXAPARIN SODIUM 40 MG/0.4ML ~~LOC~~ SOLN
40.0000 mg | SUBCUTANEOUS | Status: DC
  Filled 2015-12-29: qty 0.4

## 2015-12-29 MED ORDER — METOPROLOL TARTRATE 1 MG/ML IV SOLN
5.0000 mg | Freq: Four times a day (QID) | INTRAVENOUS | Status: DC
  Administered 2015-12-29 – 2015-12-30 (×3): 5 mg via INTRAVENOUS
  Filled 2015-12-29 (×6): qty 5

## 2015-12-29 SURGICAL SUPPLY — 12 items
CATH INFINITI 5FR AL1 (CATHETERS) ×1 IMPLANT
CATH INFINITI 5FR JL5 (CATHETERS) ×2 IMPLANT
CATH INFINITI 5FR MULTPACK ANG (CATHETERS) ×1 IMPLANT
CATH SWAN GANZ 7F STRAIGHT (CATHETERS) ×1 IMPLANT
KIT HEART LEFT (KITS) ×2 IMPLANT
PACK CARDIAC CATHETERIZATION (CUSTOM PROCEDURE TRAY) ×2 IMPLANT
SHEATH PINNACLE 5F 10CM (SHEATH) ×2 IMPLANT
SHEATH PINNACLE 7F 10CM (SHEATH) ×1 IMPLANT
TRANSDUCER W/STOPCOCK (MISCELLANEOUS) ×3 IMPLANT
WIRE EMERALD 3MM-J .025X260CM (WIRE) ×2 IMPLANT
WIRE EMERALD 3MM-J .035X150CM (WIRE) ×1 IMPLANT
WIRE EMERALD ST .035X150CM (WIRE) ×1 IMPLANT

## 2015-12-29 NOTE — Consult Note (Signed)
Craig [name]       ,Craig [name]             [phone]      Cardiothoracic Surgery Consultation  Reason for Consult: Severe aortic stenosis and multivessel coronary artery disease  Referring Physician: Dr. Peter [name]  Craig [name] is an 65 y.o. male.  HPI:   The patient is a 65 year old gentleman with a long heavy smoking history until 2010, COPD, hypertension and hyperlipidemia who reports being in his usual state of health with chronic exertional dyspnea and occasional episodes of dull chest aching until Monday a week ago. He then started having fever, chills, cough productive of white sputum and dyspnea. This continued to progress over the week and his sputum turned pink. He tried breathing treatments at home without relief of symptoms. He began having chest discomfort and on Friday the severity of his symptoms prompted a visit to the ER. His CTA in the ER showed no PE. It did show significant emphysematous changes and consolidation in the right lung base as well as an area of interstitial thickening in the right apical region concerning for pneumonia. There was also diffuse dilation of the ascending aorta with involvement of the aortic arch with a maximum diameter of 5.2 cm. No contrast was seen in the aorta. His initial troponin was 0.91, then 2.32. EKG demonstrated variable sinus tachycardia & ectopic atrial tachycardia, borderline LVH with repolarization, inferior Q waves, transient inferior ST-T abnormalities, & supraventricular as well as ventricular ectopy. He was hypoxic in the ER with sats of 91% and was felt to have pneumonia. He was admitted.  The following day he was seen by CCM for acute respiratory failure with hypercarbia and hypoxemia and was given a trial of bipap which he did not tolerate and was intubated and transferred to the MICU. A couple hours later he extubated himself and has remained stable. An echo yesterday showed a heavily  calcified aortic valve with a mean gradient of 23 mm Hg and a peak of 60 mm Hg. His DI was 0.18 and the AVA was 0.9 cm2. There was moderate LV dysfunction with an EF of 35-40% with inferolateral and inferoseptal akinesis. He was loaded with Plavix. Cardiac cath today showed 50% ostial LM stenosis and 3-vessel CAD with moderate pulmonary hypertension with PAP 64/34 and a wedge pressure of 33. CI was 2.56. ABG this afternoon showed a PO2 of 58 on 3L. Co-ox 57%.  He reports at least a 5 year history of dyspnea with significant exertion that was felt to be due to his COPD. Despite this he has remained active and walks a mile slowly three days a week. 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   Past Medical History  Diagnosis Date  . COPD (chronic obstructive pulmonary disease) ([place])   . Hypertension   . Hyperlipidemia   . Alcohol abuse     Prior  . Tobacco abuse     Prior    Past Surgical History  Procedure Laterality Date  . Ruptured disc repair  2009 or 2010    Family History  Problem Relation Age of Onset  . Diabetes Father     Social History:  reports that he has quit smoking. He has never used smokeless tobacco. He reports that he does not drink alcohol or use illicit drugs.  Allergies: No Known Allergies  Medications:  I have reviewed the patient's current medications. Prior to Admission:  No prescriptions prior to admission   Scheduled: . antiseptic oral rinse  7 mL Mouth Rinse BID  . atorvastatin  80 mg Oral q1800  . azithromycin  500 mg Intravenous Q24H  . cefTRIAXone (ROCEPHIN)  IV  1 g Intravenous Q24H  . [START ON 12/30/2015] enoxaparin (LOVENOX) injection  40 mg Subcutaneous Q24H  . ipratropium-albuterol  3 mL Nebulization Q4H  . methylPREDNISolone (SOLU-MEDROL) injection  40 mg Intravenous 3 times per day  . metoprolol  5 mg Intravenous 4 times per day  . sodium chloride flush  3 mL Intravenous Q12H   Continuous:   GYI:RSWNIO chloride, acetaminophen **OR** acetaminophen, albuterol, metoprolol, ondansetron **OR** ondansetron (ZOFRAN) IV, sodium chloride flush Anti-infectives    Start     Dose/Rate Route Frequency Ordered Stop   12/27/15 2200  cefTRIAXone (ROCEPHIN) 1 g in dextrose 5 % 50 mL IVPB     1 g 100 mL/hr over 30 Minutes Intravenous Every 24 hours 12/26/15 2140     12/27/15 2200  azithromycin (ZITHROMAX) 500 mg in dextrose 5 % 250 mL IVPB     500 mg 250 mL/hr over 60 Minutes Intravenous Every 24 hours 12/26/15 2141     12/26/15 2115  cefTRIAXone (ROCEPHIN) 1 g in dextrose 5 % 50 mL IVPB     1 g 100 mL/hr over 30 Minutes Intravenous  Once 12/26/15 2100 12/26/15 2245   12/26/15 2115  azithromycin (ZITHROMAX) 500 mg in dextrose 5 % 250 mL IVPB     500 mg 250 mL/hr over 60 Minutes Intravenous  Once 12/26/15 2100 12/27/15 0028   12/26/15 2115  oseltamivir (TAMIFLU) capsule 75 mg     75 mg Oral  Once 12/26/15 2100 12/26/15 2155      Results for orders placed or performed during the hospital encounter of 12/26/15 (from the past 48 hour(s))  Troponin I     Status: Abnormal   Collection Time: 12/28/15  2:38 AM  Result Value Ref Range   Troponin I 0.83 (HH) <0.031 ng/mL    Comment:        POSSIBLE MYOCARDIAL ISCHEMIA. SERIAL TESTING RECOMMENDED. CRITICAL VALUE NOTED.  VALUE IS CONSISTENT WITH PREVIOUSLY REPORTED AND CALLED VALUE.   Basic metabolic panel     Status: Abnormal   Collection Time: 12/28/15  2:38 AM  Result Value Ref Range   Sodium 137 135 - 145 mmol/L   Potassium 4.9 3.5 - 5.1 mmol/L   Chloride 104 101 - 111 mmol/L   CO2 23 22 - 32 mmol/L   Glucose, Bld 184 (H) 65 - 99 mg/dL   BUN 21 (H) 6 - 20 mg/dL   Creatinine, Ser 1.05 0.61 - 1.24 mg/dL   Calcium 8.9 8.9 - 10.3 mg/dL   GFR calc non Af Amer >60 >60 mL/min   GFR calc Af Amer >60 >60 mL/min    Comment: (NOTE) The eGFR has been calculated using the CKD EPI equation. This calculation has not been validated in all clinical  situations. eGFR's persistently <60 mL/min signify possible Chronic Kidney Disease.    Anion gap 10 5 - 15  CBC     Status: Abnormal   Collection Time: 12/28/15  2:38 AM  Result Value Ref Range   WBC 7.8 4.0 - 10.5 K/uL   RBC 3.73 (L) 4.22 - 5.81 MIL/uL   Hemoglobin 11.9 (L) 13.0 - 17.0 g/dL   HCT 36.4 (L) 39.0 - 52.0 %  MCV 97.6 78.0 - 100.0 fL   MCH 31.9 26.0 - 34.0 pg   MCHC 32.7 30.0 - 36.0 g/dL   RDW 12.5 11.5 - 15.5 %   Platelets 249 150 - 400 K/uL  Magnesium     Status: None   Collection Time: 12/28/15  3:32 PM  Result Value Ref Range   Magnesium 2.2 1.7 - 2.4 mg/dL  Phosphorus     Status: None   Collection Time: 12/28/15  3:32 PM  Result Value Ref Range   Phosphorus 2.9 2.5 - 4.6 mg/dL  Heparin level (unfractionated)     Status: None   Collection Time: 12/28/15  8:55 PM  Result Value Ref Range   Heparin Unfractionated 0.53 0.30 - 0.70 IU/mL    Comment:        IF HEPARIN RESULTS ARE BELOW EXPECTED VALUES, AND PATIENT DOSAGE HAS BEEN CONFIRMED, SUGGEST FOLLOW UP TESTING OF ANTITHROMBIN III LEVELS.   Basic metabolic panel     Status: Abnormal   Collection Time: 12/29/15  2:59 AM  Result Value Ref Range   Sodium 138 135 - 145 mmol/L   Potassium 4.6 3.5 - 5.1 mmol/L   Chloride 103 101 - 111 mmol/L   CO2 25 22 - 32 mmol/L   Glucose, Bld 150 (H) 65 - 99 mg/dL   BUN 27 (H) 6 - 20 mg/dL   Creatinine, Ser 1.10 0.61 - 1.24 mg/dL   Calcium 9.0 8.9 - 10.3 mg/dL   GFR calc non Af Amer >60 >60 mL/min   GFR calc Af Amer >60 >60 mL/min    Comment: (NOTE) The eGFR has been calculated using the CKD EPI equation. This calculation has not been validated in all clinical situations. eGFR's persistently <60 mL/min signify possible Chronic Kidney Disease.    Anion gap 10 5 - 15  CBC     Status: Abnormal   Collection Time: 12/29/15  2:59 AM  Result Value Ref Range   WBC 15.4 (H) 4.0 - 10.5 K/uL   RBC 3.69 (L) 4.22 - 5.81 MIL/uL   Hemoglobin 11.7 (L) 13.0 - 17.0 g/dL   HCT  35.9 (L) 39.0 - 52.0 %   MCV 97.3 78.0 - 100.0 fL   MCH 31.7 26.0 - 34.0 pg   MCHC 32.6 30.0 - 36.0 g/dL   RDW 13.0 11.5 - 15.5 %   Platelets 281 150 - 400 K/uL  Heparin level (unfractionated)     Status: None   Collection Time: 12/29/15  2:59 AM  Result Value Ref Range   Heparin Unfractionated 0.30 0.30 - 0.70 IU/mL    Comment:        IF HEPARIN RESULTS ARE BELOW EXPECTED VALUES, AND PATIENT DOSAGE HAS BEEN CONFIRMED, SUGGEST FOLLOW UP TESTING OF ANTITHROMBIN III LEVELS.   Basic metabolic panel     Status: Abnormal   Collection Time: 12/29/15  5:20 AM  Result Value Ref Range   Sodium 139 135 - 145 mmol/L   Potassium 4.6 3.5 - 5.1 mmol/L   Chloride 105 101 - 111 mmol/L   CO2 25 22 - 32 mmol/L   Glucose, Bld 150 (H) 65 - 99 mg/dL   BUN 25 (H) 6 - 20 mg/dL   Creatinine, Ser 1.02 0.61 - 1.24 mg/dL   Calcium 9.0 8.9 - 10.3 mg/dL   GFR calc non Af Amer >60 >60 mL/min   GFR calc Af Amer >60 >60 mL/min    Comment: (NOTE) The eGFR has been calculated using the CKD  EPI equation. This calculation has not been validated in all clinical situations. eGFR's persistently <60 mL/min signify possible Chronic Kidney Disease.    Anion gap 9 5 - 15  Protime-INR     Status: None   Collection Time: 12/29/15  5:20 AM  Result Value Ref Range   Prothrombin Time 14.9 11.6 - 15.2 seconds   INR 1.15 0.00 - 1.49  CBC     Status: Abnormal   Collection Time: 12/29/15  5:20 AM  Result Value Ref Range   WBC 12.6 (H) 4.0 - 10.5 K/uL   RBC 3.51 (L) 4.22 - 5.81 MIL/uL   Hemoglobin 11.6 (L) 13.0 - 17.0 g/dL   HCT 34.4 (L) 39.0 - 52.0 %   MCV 98.0 78.0 - 100.0 fL   MCH 33.0 26.0 - 34.0 pg   MCHC 33.7 30.0 - 36.0 g/dL   RDW 13.1 11.5 - 15.5 %   Platelets 251 150 - 400 K/uL  I-STAT 3, arterial blood gas (G3+)     Status: Abnormal   Collection Time: 12/29/15  2:07 PM  Result Value Ref Range   pH, Arterial 7.409 7.350 - 7.450   pCO2 arterial 36.8 35.0 - 45.0 mmHg   pO2, Arterial 58.0 (L) 80.0 -  100.0 mmHg   Bicarbonate 23.3 20.0 - 24.0 mEq/L   TCO2 24 0 - 100 mmol/L   O2 Saturation 90.0 %   Acid-base deficit 1.0 0.0 - 2.0 mmol/L   Patient temperature HIDE    Sample type ARTERIAL   I-STAT 3, venous blood gas (G3P V)     Status: Abnormal   Collection Time: 12/29/15  2:15 PM  Result Value Ref Range   pH, Ven 7.359 (H) 7.250 - 7.300   pCO2, Ven 44.5 (L) 45.0 - 50.0 mmHg   pO2, Ven 31.0 30.0 - 45.0 mmHg   Bicarbonate 25.1 (H) 20.0 - 24.0 mEq/L   TCO2 26 0 - 100 mmol/L   O2 Saturation 57.0 %   Acid-base deficit 1.0 0.0 - 2.0 mmol/L   Patient temperature HIDE    Sample type VENOUS   POCT Activated clotting time     Status: None   Collection Time: 12/29/15  3:17 PM  Result Value Ref Range   Activated Clotting Time 121 seconds  CBC     Status: Abnormal   Collection Time: 12/29/15  4:49 PM  Result Value Ref Range   WBC 14.7 (H) 4.0 - 10.5 K/uL   RBC 4.14 (L) 4.22 - 5.81 MIL/uL   Hemoglobin 13.4 13.0 - 17.0 g/dL   HCT 40.3 39.0 - 52.0 %   MCV 97.3 78.0 - 100.0 fL   MCH 32.4 26.0 - 34.0 pg   MCHC 33.3 30.0 - 36.0 g/dL   RDW 13.1 11.5 - 15.5 %   Platelets 336 150 - 400 K/uL  Creatinine, serum     Status: None   Collection Time: 12/29/15  4:49 PM  Result Value Ref Range   Creatinine, Ser 1.03 0.61 - 1.24 mg/dL   GFR calc non Af Amer >60 >60 mL/min   GFR calc Af Amer >60 >60 mL/min    Comment: (NOTE) The eGFR has been calculated using the CKD EPI equation. This calculation has not been validated in all clinical situations. eGFR's persistently <60 mL/min signify possible Chronic Kidney Disease.     Dg Chest Port 1 View  12/28/2015  CLINICAL DATA:  Acute respiratory failure. The patient extubated himself yesterday. EXAM: PORTABLE CHEST 1 VIEW COMPARISON:  Yesterday. FINDINGS: The cardiac silhouette remains borderline enlarged. Decreased bilateral airspace opacity. Stable bilateral pleural effusions and prominence of the pulmonary vasculature and interstitial markings. No  acute bony abnormality. IMPRESSION: 1. Improving congestive heart failure superimposed on COPD. 2. Persistent probable atelectasis at the right lung base and mild left basilar atelectasis. Electronically Signed   By: Claudie Revering M.D.   On: 12/28/2015 07:14    Review of Systems  Constitutional: Positive for fever and chills. Negative for weight loss and malaise/fatigue.  HENT: Negative.   Respiratory: Positive for cough, sputum production, shortness of breath and wheezing. Negative for hemoptysis.   Cardiovascular: Positive for chest pain. Negative for palpitations, orthopnea, leg swelling and PND.  Gastrointestinal: Negative.   Genitourinary: Negative.   Musculoskeletal: Negative.   Skin: Negative.   Neurological: Negative.  Negative for dizziness and loss of consciousness.  Endo/Heme/Allergies: Negative.   Psychiatric/Behavioral: Negative.    Blood pressure 126/99, pulse 107, temperature 97.7 F (36.5 [name]), temperature source Oral, resp. rate 27, height _0  (1.753 m), weight 67.8 kg (149 lb 7.6 oz), SpO2 95 %. Physical Exam  Constitutional: He is oriented to person, place, and time. He appears well-developed and well-nourished. No distress.  HENT:  Head: Normocephalic and atraumatic.  Mouth/Throat: Oropharynx is clear and moist.  Eyes: EOM are normal. Pupils are equal, round, and reactive to light.  Neck: Normal range of motion. Neck supple. No JVD present. No thyromegaly present.  Cardiovascular: Normal rate, regular rhythm and normal heart sounds.   1/6 systolic murmur along RSB  Respiratory: Effort normal. No respiratory distress.  Diminished breath sounds in bases with crackles  GI: Soft. Bowel sounds are normal. He exhibits no distension and no mass. There is no tenderness.  Musculoskeletal: Normal range of motion. He exhibits no edema.  Lymphadenopathy:    He has no cervical adenopathy.  Neurological: He is alert and oriented to person, place, and time. He has normal  strength. No cranial nerve deficit or sensory deficit.  Skin: Skin is warm and dry.  Psychiatric: He has a normal mood and affect.             *[facility]*            [place], [place] [address]              [phone]  ------------------------------------------------------------------- Transthoracic Echocardiography  Patient:  Craig [name], Craig [name] MR #:    [id number] Study Date: 12/28/2015 Gender:   M Age:    40 Height:   175.3 cm Weight:   65.7 kg BSA:    1.79 m^2 Pt. Status: Room:    2M10C  ADMITTING  Craig [name], Craig [name], Craig [name] ATTENDING  Craig [name] PERFORMING  [facility], Inpatient SONOGRAPHER Roseanna Rainbow  cc:  ------------------------------------------------------------------- LV EF: 35% -  40%  ------------------------------------------------------------------- Indications:   Chest pain 786.51.  ------------------------------------------------------------------- History:  PMH: Elevated troponin. Chronic obstructive pulmonary disease. PMH:  Myocardial infarction. Risk factors: Hypertension. Dyslipidemia.  ------------------------------------------------------------------- Study Conclusions  - Left ventricle: The cavity size was normal. There was moderate concentric hypertrophy. Systolic function was moderately reduced. The estimated ejection fraction was in the range of 35% to 40%. There is akinesis of the mid-apicalinferolateral myocardium. There is akinesis of the basal-midinferoseptal myocardium. Doppler parameters are consistent with high ventricular filling pressure. - Aortic valve:  The AV is heavily calcified and the right coronary cusp is fixed and immobile. The mean gradient and peak velocity are consistent with moderate AS but this is most  likely underestimated but LV dysfunction with reduced Cardiac output The calculated AVA is consistent with severe AS and visually the valve mobility is severely reduced. Valve area (VTI): 0.91 cm^2. Valve area (Vmax): 0.82 cm^2. Valve area (Vmean): 0.87 cm^2. - Aorta: Aortic root dimension: 42 mm (ED). - Mitral valve: Calcified annulus. There was mild regurgitation. - Left atrium: The atrium was mildly dilated. - Right ventricle: The RV apex appears akinetic.  Transthoracic echocardiography. M-mode, complete 2D, spectral Doppler, and color Doppler. Birthdate: Patient birthdate: Mar 24, 1951. Age: Patient is 65 yr old. Sex: Gender: male. BMI: 21.4 kg/m^2. Blood pressure:   112/93 Patient status: Inpatient. Study date: Study date: 12/28/2015. Study time: 02:09 PM. Location: ICU/CCU  -------------------------------------------------------------------  ------------------------------------------------------------------- Left ventricle: The cavity size was normal. There was moderate concentric hypertrophy. Systolic function was moderately reduced. The estimated ejection fraction was in the range of 35% to 40%. Regional wall motion abnormalities:  There is akinesis of the mid-apicalinferolateral myocardium. There is akinesis of the basal-midinferoseptal myocardium. The transmitral flow pattern was not recorded. The tissue Doppler parameters were abnormal. Doppler parameters are consistent with high ventricular filling pressure.  ------------------------------------------------------------------- Aortic valve: The AV is heavily calcified and the right coronary cusp is fixed and immobile. The mean gradient and peak velocity are consistent with moderate AS but this is most likely underestimated but LV dysfunction with reduced Cardiac output The calculated AVA is consistent with severe AS and visually the valve mobility is severely reduced. Trileaflet; normal  thickness leaflets. Mobility was not restricted. Doppler: Transvalvular velocity was within the normal range. There was no stenosis. There was no regurgitation.  VTI ratio of LVOT to aortic valve: 0.2. Valve area (VTI): 0.91 cm^2. Indexed valve area (VTI): 0.51 cm^2/m^2. Peak velocity ratio of LVOT to aortic valve: 0.18. Valve area (Vmax): 0.82 cm^2. Indexed valve area (Vmax): 0.46 cm^2/m^2. Mean velocity ratio of LVOT to aortic valve: 0.19. Valve area (Vmean): 0.87 cm^2. Indexed valve area (Vmean): 0.49 cm^2/m^2.  Mean gradient (S): 23 mm Hg. Peak gradient (S): 60 mm Hg.  ------------------------------------------------------------------- Aorta: Mildly dilated aortic root at the sinuses of Valsalva. Aortic root: The aortic root was normal in size.  ------------------------------------------------------------------- Mitral valve:  Calcified annulus. Mobility was not restricted. Doppler: Transvalvular velocity was within the normal range. There was no evidence for stenosis. There was mild regurgitation.  Peak gradient (D): 4 mm Hg.  ------------------------------------------------------------------- Left atrium: The atrium was mildly dilated.  ------------------------------------------------------------------- Right ventricle: The RV apex appears akinetic. The cavity size was normal. Wall thickness was normal. Systolic function was normal.  ------------------------------------------------------------------- Pulmonic valve:  Structurally normal valve.  Cusp separation was normal. Doppler: Transvalvular velocity was within the normal range. There was no evidence for stenosis. There was no regurgitation.  ------------------------------------------------------------------- Tricuspid valve:  Structurally normal valve.  Doppler: Transvalvular velocity was within the normal range. There was  no regurgitation.  ------------------------------------------------------------------- Pulmonary artery:  The main pulmonary artery was normal-sized. Systolic pressure could not be accurately estimated.  ------------------------------------------------------------------- Right atrium: The atrium was normal in size.  ------------------------------------------------------------------- Pericardium: There was no pericardial effusion.  ------------------------------------------------------------------- Systemic veins: Inferior vena cava: The vessel was normal in size.  ------------------------------------------------------------------- Post procedure conclusions Ascending Aorta:  - Mildly dilated aortic root at the sinuses of Valsalva.  ------------------------------------------------------------------- Measurements  Left ventricle  Value     Reference LV ID, ED, PLAX chordal      (L)   33  mm    43 - 52 LV ID, ES, PLAX chordal          28  mm    23 - 38 LV fx shortening, PLAX chordal  (L)   15  %    >=29 LV PW thickness, ED            15  mm    --------- IVS/LV PW ratio, ED            1.13      <=1.3 Stroke volume, 2D             53  ml    --------- Stroke volume/bsa, 2D           30  ml/m^2  --------- LV ejection fraction, 1-p A4C       47  %    --------- LV end-diastolic volume, 2-p       108  ml    --------- LV end-systolic volume, 2-p        68  ml    --------- LV ejection fraction, 2-p         37  %    --------- Stroke volume, 2-p            40  ml    --------- LV end-diastolic volume/bsa, 2-p     60  ml/m^2  --------- LV end-systolic volume/bsa, 2-p      38  ml/m^2  --------- Stroke volume/bsa, 2-p          22.5 ml/m^2  --------- LV  e&', lateral              14.7 cm/s   --------- LV E/e&', lateral             7.01      --------- LV e&', medial               4.68 cm/s   --------- LV E/e&', medial              22.01     --------- LV e&', average              9.69 cm/s   --------- LV E/e&', average             10.63     ---------  Ventricular septum            Value     Reference IVS thickness, ED             17  mm    ---------  LVOT                   Value     Reference LVOT ID, S                24  mm    --------- LVOT area                 4.52 cm^2   --------- LVOT peak velocity, S           69.6 cm/s   --------- LVOT mean velocity, S           41  cm/s   --------- LVOT VTI, S                11.7 cm    --------- LVOT peak gradient, S  2   mm Hg  ---------  Aortic valve               Value     Reference Aortic valve peak velocity, S       386  cm/s   --------- Aortic valve mean velocity, S       212  cm/s   --------- Aortic valve VTI, S            57.8 cm    --------- Aortic mean gradient, S          23  mm Hg  --------- Aortic peak gradient, S          60  mm Hg  --------- VTI ratio, LVOT/AV            0.2      --------- Aortic valve area, VTI          0.91 cm^2   --------- Aortic valve area/bsa, VTI        0.51 cm^2/m^2 --------- Velocity ratio, peak, LVOT/AV       0.18      --------- Aortic valve area, peak velocity     0.82 cm^2   --------- Aortic valve area/bsa, peak        0.46 cm^2/m^2 --------- velocity Velocity ratio, mean, LVOT/AV       0.19       --------- Aortic valve area, mean velocity     0.87 cm^2   --------- Aortic valve area/bsa, mean        0.49 cm^2/m^2 --------- velocity  Aorta                   Value     Reference Aortic root ID, ED            42  mm    ---------  Left atrium                Value     Reference LA ID, A-P, ES              40  mm    --------- LA ID/bsa, A-P          (H)   2.24 cm/m^2  <=2.2 LA volume, S               72.2 ml    --------- LA volume/bsa, S             40.4 ml/m^2  --------- LA volume, ES, 1-p A4C          74  ml    --------- LA volume/bsa, ES, 1-p A4C        41.4 ml/m^2  --------- LA volume, ES, 1-p A2C          69  ml    --------- LA volume/bsa, ES, 1-p A2C        38.6 ml/m^2  ---------  Mitral valve               Value     Reference Mitral E-wave peak velocity        103  cm/s   --------- Mitral deceleration time     (L)   99  ms    150 - 230 Mitral peak gradient, D          4   mm Hg  ---------  Systemic veins              Value  Reference Estimated CVP               3   mm Hg  ---------  Right ventricle              Value     Reference TAPSE                   21.4 mm    --------- RV s&', lateral, S             16.4 cm/s   ---------  Legend: (L) and (H) mark values outside specified reference range.  ------------------------------------------------------------------- Prepared and Electronically Authenticated by  Craig Him, MD 2017-02-20T17:21:24     Craig M Martinique, MD (Primary)      Procedures    Right/Left Heart Cath and Coronary Angiography    Conclusion     Ost RCA to Prox RCA lesion, 90%  stenosed.  Mid RCA lesion, 45% stenosed.  Ost LM lesion, 50% stenosed.  Ost LAD to Mid LAD lesion, 50% stenosed.  Dist LAD lesion, 70% stenosed.  Ost Cx to Prox Cx lesion, 70% stenosed.  1. 3 vessel obstructive CAD  - 50% ostial left main  - diffuse 50% proximal to mid LAD, 70% distal LAD- heavily calcified.  - 70% proximal LCx  -90% proximal RCA 2. Moderate pulmonary HTN with elevated LV filling pressures.  Plan: will need to consider for CABG and AVR. Plavix held.      Indications    Aortic stenosis [I35.0 (ICD-10-CM)]    Technique and Indications    Indication: 65 yo WM presents with acute respiratory failure and NSTEMI. By Echo he has severe AS and moderate LV dysfunction.  Procedural Details: The right groin was prepped, draped, and anesthetized with 1% lidocaine. Using the modified Seldinger technique a 5 Fr sheath was placed in the right femoral artery and a 7 French sheath was placed in the right femoral vein. A Swan-Ganz catheter was used for the right heart catheterization. Standard protocol was followed for recording of right heart pressures and sampling of oxygen saturations. Fick cardiac output was calculated. Standard Judkins catheters were used for selective coronary angiography. Multiple attempts were made to cross the aortic valve with a pigtail and LA1 catheter without success. There were no immediate procedural complications. The patient was transferred to the post catheterization recovery area for further monitoring. Contrast: 55 cc  During this procedure the patient is administered a total of Versed 1 mg and Fentanyl 25 mg to achieve and maintain moderate conscious sedation. The patient's heart rate, blood pressure, and oxygen saturation are monitored continuously during the procedure. The period of conscious sedation is 36 minutes, of which I was present face-to-face 100% of this time.  Estimated blood loss <50 mL. There were no immediate  complications during the procedure.    Coronary Findings    Dominance: Right   Left Main   . Ost LM lesion, 50% stenosed. Moderately Calcified.     Left Anterior Descending   . Ost LAD to Mid LAD lesion, 50% stenosed. Severely Calcified diffuse.   Craig [name] LAD lesion, 70% stenosed.     Left Circumflex   . Ost Cx to Prox Cx lesion, 70% stenosed. Moderately Calcified.     Right Coronary Artery   . Ost RCA to Prox RCA lesion, 90% stenosed. Severely Calcified.   . Mid RCA lesion, 45% stenosed. Severely Calcified.       Right Heart Pressures Hemodynamic findings consistent with moderate pulmonary  hypertension. Elevated LV EDP consistent with volume overload.    Coronary Diagrams    Diagnostic Diagram            Implants     No implant documentation for this case.    PACS Images    Show images for Cardiac catheterization     Link to Procedure Log    Procedure Log      Hemo Data       Most Recent Value   Fick Cardiac Output  4.6 L/min   Fick Cardiac Output Index  2.56 (L/min)/BSA   RA A Wave  15 mmHg   RA V Wave  10 mmHg   RA Mean  10 mmHg   RV Systolic Pressure  59 mmHg   RV Diastolic Pressure  8 mmHg   RV EDP  11 mmHg   PA Systolic Pressure  64 mmHg   PA Diastolic Pressure  34 mmHg   PA Mean  51 mmHg   PW A Wave  34 mmHg   PW V Wave  32 mmHg   PW Mean  33 mmHg   AO Systolic Pressure  732 mmHg   AO Diastolic Pressure  85 mmHg   AO Mean  99 mmHg   QP/QS  1   CLINICAL DATA: 65 year old male with shortness of breath copy, tachycardia.  EXAM: CT ANGIOGRAPHY CHEST WITH CONTRAST  TECHNIQUE: Multidetector CT imaging of the chest was performed using the standard protocol during bolus administration of intravenous contrast. Multiplanar CT image reconstructions and MIPs were obtained to evaluate the vascular anatomy.  CONTRAST: 5m OMNIPAQUE IOHEXOL 350 MG/ML SOLN  COMPARISON: Radiograph dated  12/26/2015  FINDINGS: There is emphysematous changes of the lungs. Patchy area of consolidative changes at the right lung base as well as area of interstitial thickening in the right apical region are concerning for pneumonia. There is a small right pleural effusion. No pneumothorax. The central airways are patent. There is mild bronchiectatic changes with mild thickening of the bronchial wall.  There is diffuse dilatation of the ascending aorta with involvement of the proximal arch measuring up to 5.2 cm in diameter. Evaluation of the aorta is limited as timing of the contrast was designed for optimal opacification of the pulmonary arteries. No definite dissection identified. There is no periaortic fluid. There is atherosclerotic calcification of thoracic aorta.  No CT evidence of pulmonary embolism.  There is no cardiomegaly or significant pericardial effusion. There is coronary vascular calcification. There is calcification of the aortic valves. There is thickened appearance of the left ventricular myocardium, likely myocardial hypertrophy. Mild dilatation of the left atrium. Correlation with echocardiogram recommended. There is retrograde flow of contrast from the right atrium into the IVC compatible with a degree of right cardiac dysfunction. Right hilar and subcarinal adenopathy.  The esophagus and the thyroid gland are grossly unremarkable. There is no axillary adenopathy. The chest wall soft tissues appear unremarkable. Old healed bilateral rib fractures. No acute fracture.  The visualized upper abdomen appear grossly unremarkable.  Review of the MIP images confirms the above findings.  IMPRESSION: No CT evidence of pulmonary embolism.  Emphysema with an area of consolidative change in the right lung base compatible with pneumonia. Small right pleural effusion.  Diffuse dilatation of the ascending aorta and proximal arch measuring up to 5.2 cm. Evaluation  of the aorta is limited on this study as timing of the contrast was designed to opacifying the pulmonary vasculature. No definite dissection identified. CT angiography with  optimal opacification of the aorta may provide additional evaluation. Ascending thoracic aortic aneurysm. Recommend semi-annual imaging followup by CTA or MRA and referral to cardiothoracic surgery if not already obtained. This recommendation follows 2010 ACCF/AHA/AATS/ACR/ASA/SCA/SCAI/SIR/STS/SVM Guidelines for the Diagnosis and Management of Patients With Thoracic Aortic Disease. Circulation. 2010; 121: R416-L845  Calcification of the aortic valves with probable resulting aortic valve stenosis. Dilatation of the ascending aorta may represent post stenotic aneurysm. There is also left ventricular hypertrophy and mild dilatation of the left atrium. Echocardiogram is recommended for further evaluation.   Electronically Signed  By: Anner Crete M.D.  On: 12/26/2015 21:19   Assessment/Plan:  1. 50% ostial LM and significant 3-vessel coronary disease with moderate LV systolic dysfunction with an EF of 35-40%.  2. Severe aortic stenosis. His valve is very calcified and looks severely stenotic although the mean gradient by echo was only measured at 23 mm Hg. The dimensionless index was 0.18 consistent with severe AS. Peak velocity was 3.86 m/sec.   3. He has aneurysmal enlargement of the aortic root, ascending aorta and arch out to the level just beyond the left subclavian artery. The aortic arch is significantly calcified. The arch vessels are mildly aneurysmal. The maximal diameter is 5.2 cm in the mid ascending aorta.   4. Severe COPD changes on CT. PFT's not done yet but I suspect that they would not be accurate at this time due to his pneumonia and acute illness.  5. Probably right lung pneumonia.   I suspect that pneumonia on top of severe COPD is probably what precipitated his symptoms prompting  admission. He does have significant CAD and probably severe AS that could have been contributing factors but his chronic stable exertional dyspnea was stable until this recent episode started. He has had some intermittent mild chest dull aching with exertion but nothing that he even thought about. I think he would benefit from CABG and AVR at some point but unfortunately he is probably going to need replacement of his aortic root, ascending and arch to prevent further enlargement with the risk of dissection or rupture. This would all need to be done at the same time and I think that if he has surgery he will need all of this done at the same time. This is a major operation with significant risk and his lungs need to be in optimal condition. At the present time he has right lung consolidation on CT, hypoxemia on 3L [place], moderate to severe pulmonary hypertension with a PCW of 33 and moderate LV dysfunction. He will not get through that surgery with adequate lung function to allow weaning from bypass unless his lungs are maximally tuned up. I think this is likely to take a few weeks and I think he should have PFT's once his lungs clear up to get a baseline. He needs to stay off Plavix. I reviewed his cath, echo and CT findings with [name] and discussed treatment and benefits/risks.  All questions have been answered. I will follow his progress. I don't think this surgery is urgent and his best chance of making it through surgery like this is to be in the best condition possible preop.    Gaye Pollack 12/29/2015, 9:58 PM

## 2015-12-29 NOTE — Progress Notes (Signed)
PULMONARY / CRITICAL CARE MEDICINE   Name: Craig [name] MRN: [id number] DOB: April 21, 1951    ADMISSION DATE:  12/26/2015 CONSULTATION DATE:  12/27/15  REFERRING MD:  Dr. David Stall  CHIEF COMPLAINT:  Acute Hypoxic Respiratory Failure  HISTORY OF PRESENT ILLNESS:   65 y/o M with PMH of HTN, HLD, ETOH abuse, tobacco abuse and COPD who presented to [facility] on 12/26/15 with a 24 hour history of worsening shortness of breath, cough, chest tightness, fevers/chilld, and myalgias, RA saturation of 91%.  T CTA of the chest which was negative for PE but demonstrated emphysema, RUL consolidation, small R pleural effusion, dilation of ascending aortia, proximal arch measuring up to 5.2 cm without dissection, and ascending thoracic aortic aneurysm.    The patient was admitted per [facility] for community acquired PNA.  He was treated with IV rocephin / azithromycin.  Due to elevated troponin, Cardiology was consulted for evaluation.  He was placed on a heparin gtt, nitro gtt, plavix, asa, and lopressor.  Cardiology evaluation felt elevated troponin likely due to demand ischemia in the setting of PNA with underlying severe emphysema.    On 2/19, the patient had progressive respiratory distress and hypoxemia.    Intubated and transferred to ICU.     SUBJECTIVE:  No CP Still short of breath this am.  Tachycardic overnight with some control with lopressor No nausea, diaphoresis.    VITAL SIGNS: BP 99/83 mmHg  Pulse 102  Temp(Src) 98.3 F (36.8 C) (Oral)  Resp 16  Ht  (1.753 m)  Wt 144 lb 13.5 oz (65.7 kg)  BMI 21.38 kg/m2  SpO2 99%  HEMODYNAMICS:    VENTILATOR SETTINGS:    INTAKE / OUTPUT: I/O last 3 completed shifts: In: 1144 [I.V.:544; IV Piggyback:600] Out: 940 [Urine:940]  PHYSICAL EXAMINATION: General:  Thin adult male Neuro:  AAOx3,  HEENT:  MM, NCAT.  Cardiovascular:  s1s2 rrr, difficult to hear murmur, no carotid radiation for me.  Lungs:  Less labored , lungs decreased  bilaterally at the bases.  Abdomen:  Soft, bsx4 active Musculoskeletal:  No acute deformities  Skin: no edema, rashes or lesions  LABS:  BMET  Recent Labs Lab 12/28/15 0238 12/29/15 0259 12/29/15 0520  NA 137 138 139  K 4.9 4.6 4.6  CL 104 103 105  CO2 BUN 21* 27* 25*  CREATININE 1.05 1.10 1.02  GLUCOSE 184* 150* 150*    Electrolytes  Recent Labs Lab 12/28/15 0238 12/28/15 1532 12/29/15 0259 12/29/15 0520  CALCIUM 8.9  --  9.0 9.0  MG  --  2.2  --   --   PHOS  --  2.9  --   --     CBC  Recent Labs Lab 12/28/15 0238 12/29/15 0259 12/29/15 0520  WBC 7.8 15.4* 12.6*  HGB 11.9* 11.7* 11.6*  HCT 36.4* 35.9* 34.4*  PLT 249 281 251    Coag's  Recent Labs Lab 12/26/15 2156 12/29/15 0520  APTT 32  --   INR 1.16 1.15    Sepsis Markers  Recent Labs Lab 12/26/15 2021 12/26/15 2213 12/27/15 1212  LATICACIDVEN 0.88 0.88 3.7*    ABG  Recent Labs Lab 12/27/15 1158 12/27/15 1420 12/27/15 1855  PHART 7.229* 7.158* 7.330*  PCO2ART 47.2* 64.7* 38.3  PO2ART 53.0* 185* 61.7*    Liver Enzymes No results for input(s): AST, ALT, ALKPHOS, BILITOT, ALBUMIN in the last 168 hours.  Cardiac Enzymes  Recent Labs Lab 12/27/15 1703 12/27/15 2015 12/28/15 2130  TROPONINI 1.22* 0.92* 0.83*    Glucose  Recent Labs Lab 12/27/15 0831 12/27/15 1142 12/27/15 1748  GLUCAP 109* 221* 145*    Imaging No results found.   STUDIES:  2/18  CTA Chest >> negative for PE, emphysema, RUL consolidation, small R pleural effusion, dilation of ascending aortia, proximal arch measuring up to 5.2 cm without dissection, and ascending thoracic aortic aneurysm.   CULTURES: Tracheal Aspirate 2/19 >> ng Flu 2/18 >> negative   ANTIBIOTICS: Rocephin 2/18 >>  Azithromycin 2/18 >>  Tamiflu 2/18 >>   SIGNIFICANT EVENTS: 2/18  Admit with SOB, RUL PNA.  PE ruled out.  Elevated trop leak thought demand ischemia.   2/19  Decompensated requiring intubation  due to resp distress  2/19 self extubated  LINES/TUBES: ETT 2/19 >> 2/19  DISCUSSION: 66 y/o M with PMH of emphysema / COPD admitted 2/18 with increased SOB, chest tightness.  Found to have RLL PNA.  Flu negative. Elevated troponin in setting of demand ischemia.  Decompensated on 2/19 requiring brief  intubation.    ASSESSMENT / PLAN:  PULMONARY A: Acute Hypoxic Respiratory Failure - in the setting of RLL PNA, Severe Emphysema with acute exacerbation, possible component of pulmonary edema. Flu negative PCR.  Acute Exacerbation of COPD  Hx Tobacco Abuse  P:   Duoneb Q6 with Q3 PRN albuterol  decrease solumedrol to  IV Q8 > 40 BID today, taper to off after tomorrow.  SOB primarily Cardiac issue as below.   CARDIOVASCULAR A:  NSTEMI Severe Aortic Stenosis Frequent PVC's Tachycardia  P:  Cardiology following, discussed - suggest early cath.  Echo with severe AS, and Inferoapical / septal akinesis. \ Cath today at 1 pm.  Continue Lopressor for HR control.   Heparin Gtt per cards.  Continue lipitor, ASA Discussion regarding need for intervention for Aortic Valve pending results of Cath / Cards discussion.   RENAL A:   Lactic Acidosis - suspect in setting of respiratory distress At Risk AKI - in setting of hypotension  P:   Trend BMP / UOP  Replace electrolytes as indicated   INFECTIOUS A:   RUL PNA (NOS)  P:   Empiric tamiflu as above. D/C today.  Await RVP Droplet precautions Azithro  Rocephin Abx for 7 day course.    Summary - Improved, NSTEMI contributing to pulmonary edema / decompensation, also with severe AS contributing to SOB. Cath today with plan for intervention from there. Will discuss with Cards.   Devota Pace, MD Resident - PGY 2

## 2015-12-29 NOTE — Progress Notes (Signed)
ANTICOAGULATION CONSULT NOTE - Follow-up Consult  Pharmacy Consult for heparin Indication: chest pain/ACS  No Known Allergies  Patient Measurements: Height:  (175.3 cm) Weight: 144 lb 13.5 oz (65.7 kg) IBW/kg (Calculated) : 70.7 Heparin Dosing Weight: 65.7 kg  Vital Signs: Temp: 97.5 F (36.4 C) (02/21 0000) Temp Source: Oral (02/21 0000) BP: 118/105 mmHg (02/21 0000) Pulse Rate: 116 (02/21 0000)  Labs:  Recent Labs  12/26/15 2156  12/27/15 0220  12/27/15 1032 12/27/15 1703 12/27/15 2015 12/28/15 0238 12/28/15 2055 12/29/15 0259  HGB  --   --  11.5*  --   --   --   --  11.9*  --  11.7*  HCT  --   --  35.3*  --   --   --   --  36.4*  --  35.9*  PLT  --   --  230  --   --   --   --  249  --  281  APTT 32  --   --   --   --   --   --   --   --   --   LABPROT 15.0  --   --   --   --   --   --   --   --   --   INR 1.16  --   --   --   --   --   --   --   --   --   HEPARINUNFRC  --   < > 0.14*  --  0.13*  --   --   --  0.53 0.30  CREATININE  --   --  0.98  --   --   --   --  1.05  --  1.10  TROPONINI  --   --  1.97*  < > 1.17* 1.22* 0.92* 0.83*  --   --   < > = values in this interval not displayed.  Estimated Creatinine Clearance: 63.9 mL/min (by C-G formula based on Cr of 1.1).  Assessment: 65 yo male on heparin gtt for NSTEMI, pt going to cath today. Heparin level 0.3 (low end of therapeutic). No bleeding noted.  Goal of Therapy:  Heparin level 0.3-0.7 units/ml Monitor platelets by anticoagulation protocol: Yes   Plan:  Continue heparin gtt at 900 units/hr F/u after cath  Christoper Fabian, PharmD, BCPS Clinical pharmacist, pager [phone] 12/29/2015 4:30 AM

## 2015-12-29 NOTE — Progress Notes (Signed)
   SUBJECTIVE: No pain. Continued dyspnea.   Tele: sinus  BP 132/104 mmHg  Pulse 121  Temp(Src) 97.9 F (36.6 C) (Oral)  Resp 18  Ht 5' 9" (1.753 m)  Wt 149 lb 7.6 oz (67.8 kg)  BMI 22.06 kg/m2  SpO2 95%  Intake/Output Summary (Last 24 hours) at 12/29/15 1216 Last data filed at 12/29/15 1000  Gross per 24 hour  Intake 907.25 ml  Output    640 ml  Net 267.25 ml    PHYSICAL EXAM General: Well developed, well nourished, in no acute distress. Alert and oriented x 3.  Psych:  Good affect, responds appropriately Neck: No JVD. No masses noted.  Lungs: Clear bilaterally with no wheezes or rhonci noted.  Heart: Tachy with soft systolic murmur noted.  Abdomen: Bowel sounds are present. Soft, non-tender.  Extremities: No lower extremity edema.   LABS: Basic Metabolic Panel:  Recent Labs  12/28/15 1532 12/29/15 0259 12/29/15 0520  NA  --  138 139  K  --  4.6 4.6  CL  --  103 105  CO2  --  25 25  GLUCOSE  --  150* 150*  BUN  --  27* 25*  CREATININE  --  1.10 1.02  CALCIUM  --  9.0 9.0  MG 2.2  --   --   PHOS 2.9  --   --    CBC:  Recent Labs  12/29/15 0259 12/29/15 0520  WBC 15.4* 12.6*  HGB 11.7* 11.6*  HCT 35.9* 34.4*  MCV 97.3 98.0  PLT 281 251   Cardiac Enzymes:  Recent Labs  12/27/15 1703 12/27/15 2015 12/28/15 0238  TROPONINI 1.22* 0.92* 0.83*   Fasting Lipid Panel:  Recent Labs  12/27/15 0220  CHOL 157  HDL 30*  LDLCALC 117*  TRIG 48  CHOLHDL 5.2    Current Meds: . antiseptic oral rinse  7 mL Mouth Rinse BID  . atorvastatin  80 mg Oral q1800  . azithromycin  500 mg Intravenous Q24H  . cefTRIAXone (ROCEPHIN)  IV  1 g Intravenous Q24H  . clopidogrel  75 mg Oral Daily  . ipratropium-albuterol  3 mL Nebulization Q4H  . methylPREDNISolone (SOLU-MEDROL) injection  40 mg Intravenous 3 times per day  . metoprolol  5 mg Intravenous 4 times per day  . sodium chloride flush  3 mL Intravenous Q12H   Echo 12/28/15: Left ventricle: The  cavity size was normal. There was moderate concentric hypertrophy. Systolic function was moderately reduced. The estimated ejection fraction was in the range of 35% to 40%. There is akinesis of the mid-apicalinferolateral myocardium. There is akinesis of the basal-midinferoseptal myocardium. Doppler parameters are consistent with high ventricular filling pressure. - Aortic valve: The AV is heavily calcified and the right coronary cusp is fixed and immobile. The mean gradient and peak velocity are consistent with moderate AS but this is most likely underestimated but LV dysfunction with reduced Cardiac output The calculated AVA is consistent with severe AS and visually the valve mobility is severely reduced. Valve area (VTI): 0.91 cm^2. Valve area (Vmax): 0.82 cm^2. Valve area (Vmean): 0.87 cm^2. - Aorta: Aortic root dimension: 42 mm (ED). - Mitral valve: Calcified annulus. There was mild regurgitation. - Left atrium: The atrium was mildly dilated. - Right ventricle: The RV apex appears akinetic.   ASSESSMENT AND PLAN:  1. NSTEMI/Respiratory failure: Pt admitted with chest pain, SOB and elevated troponin. He was intubated 12/27/15 due to worsened respiratory status but self extubated 12/28/15.   EKG with non-specific T wave inversions. Echo 12/28/15 with LVEF of 35-40%, wall motion abnormalities and likely severe AS. Given his presentation and findings, I think cardiac cath is indicated. With his valve findings, he will need right and left heart cath. He is currently on Plavix, beta blocker and a statin. ASA pre-cath today. He is on IV heparin. He is being treated with broad spectrum antibiotics for possible pneumonia. Cath orders placed. Risks and benefits of procedure reviewed with pt and he agrees to proceed.   2. Cardiomyopathy:  Presumed to be ischemic until proven otherwise. Cath today to define coronary anatomy. Will need to add Ace-inh or ARB post cath.   3. Severe  AS: new finding on echo yesterday. Will need Right and left heart cath today. He has severe lung disease but hopefully would be candidate for surgical AVR. I suspect he will have obstructive CAD as well.   MCALHANY,CHRISTOPHER  2/21/201712:16 PM

## 2015-12-29 NOTE — H&P (View-Only) (Signed)
SUBJECTIVE: No pain. Continued dyspnea.   Tele: sinus  BP 132/104 mmHg  Pulse 121  Temp(Src) 97.9 F (36.6 C) (Oral)  Resp 18  Ht  (1.753 m)  Wt 149 lb 7.6 oz (67.8 kg)  BMI 22.06 kg/m2  SpO2 95%  Intake/Output Summary (Last 24 hours) at 12/29/15 1216 Last data filed at 12/29/15 1000  Gross per 24 hour  Intake 907.25 ml  Output    640 ml  Net 267.25 ml    PHYSICAL EXAM General: Well developed, well nourished, in no acute distress. Alert and oriented x 3.  Psych:  Good affect, responds appropriately Neck: No JVD. No masses noted.  Lungs: Clear bilaterally with no wheezes or rhonci noted.  Heart: Tachy with soft systolic murmur noted.  Abdomen: Bowel sounds are present. Soft, non-tender.  Extremities: No lower extremity edema.   LABS: Basic Metabolic Panel:  Recent Labs  78/29/56 1532 12/29/15 0259 12/29/15 0520  NA  --  138 139  K  --  4.6 4.6  CL  --  103 105  CO2  --  25 25  GLUCOSE  --  150* 150*  BUN  --  27* 25*  CREATININE  --  1.10 1.02  CALCIUM  --  9.0 9.0  MG 2.2  --   --   PHOS 2.9  --   --    CBC:  Recent Labs  12/29/15 0259 12/29/15 0520  WBC 15.4* 12.6*  HGB 11.7* 11.6*  HCT 35.9* 34.4*  MCV 97.3 98.0  PLT 281 251   Cardiac Enzymes:  Recent Labs  12/27/15 1703 12/27/15 2015 12/28/15 0238  TROPONINI 1.22* 0.92* 0.83*   Fasting Lipid Panel:  Recent Labs  12/27/15 0220  CHOL 157  HDL 30*  LDLCALC 117*  TRIG 48  CHOLHDL 5.2    Current Meds: . antiseptic oral rinse  7 mL Mouth Rinse BID  . atorvastatin  80 mg Oral q1800  . azithromycin  500 mg Intravenous Q24H  . cefTRIAXone (ROCEPHIN)  IV  1 g Intravenous Q24H  . clopidogrel  75 mg Oral Daily  . ipratropium-albuterol  3 mL Nebulization Q4H  . methylPREDNISolone (SOLU-MEDROL) injection  40 mg Intravenous 3 times per day  . metoprolol  5 mg Intravenous 4 times per day  . sodium chloride flush  3 mL Intravenous Q12H   Echo 12/28/15: Left ventricle: The  cavity size was normal. There was moderate concentric hypertrophy. Systolic function was moderately reduced. The estimated ejection fraction was in the range of 35% to 40%. There is akinesis of the mid-apicalinferolateral myocardium. There is akinesis of the basal-midinferoseptal myocardium. Doppler parameters are consistent with high ventricular filling pressure. - Aortic valve: The AV is heavily calcified and the right coronary cusp is fixed and immobile. The mean gradient and peak velocity are consistent with moderate AS but this is most likely underestimated but LV dysfunction with reduced Cardiac output The calculated AVA is consistent with severe AS and visually the valve mobility is severely reduced. Valve area (VTI): 0.91 cm^2. Valve area (Vmax): 0.82 cm^2. Valve area (Vmean): 0.87 cm^2. - Aorta: Aortic root dimension: 42 mm (ED). - Mitral valve: Calcified annulus. There was mild regurgitation. - Left atrium: The atrium was mildly dilated. - Right ventricle: The RV apex appears akinetic.   ASSESSMENT AND PLAN:  1. NSTEMI/Respiratory failure: Pt admitted with chest pain, SOB and elevated troponin. He was intubated 12/27/15 due to worsened respiratory status but self extubated 12/28/15.  EKG with non-specific T wave inversions. Echo 12/28/15 with LVEF of 35-40%, wall motion abnormalities and likely severe AS. Given his presentation and findings, I think cardiac cath is indicated. With his valve findings, he will need right and left heart cath. He is currently on Plavix, beta blocker and a statin. ASA pre-cath today. He is on IV heparin. He is being treated with broad spectrum antibiotics for possible pneumonia. Cath orders placed. Risks and benefits of procedure reviewed with pt and he agrees to proceed.   2. Cardiomyopathy:  Presumed to be ischemic until proven otherwise. Cath today to define coronary anatomy. Will need to add Ace-inh or ARB post cath.   3. Severe  AS: new finding on echo yesterday. Will need Right and left heart cath today. He has severe lung disease but hopefully would be candidate for surgical AVR. I suspect he will have obstructive CAD as well.   MCALHANY,CHRISTOPHER  2/21/201712:16 PM

## 2015-12-29 NOTE — Interval H&P Note (Signed)
History and Physical Interval Note:  12/29/2015 1:43 PM  Craig [name]  has presented today for surgery, with the diagnosis of cp  The various methods of treatment have been discussed with the patient and family. After consideration of risks, benefits and other options for treatment, the patient has consented to  Procedure(s): Right/Left Heart Cath and Coronary Angiography (N/A) as a surgical intervention .  The patient's history has been reviewed, patient examined, no change in status, stable for surgery.  I have reviewed the patient's chart and labs.  Questions were answered to the patient's satisfaction.   Cath Lab Visit (complete for each Cath Lab visit)  Clinical Evaluation Leading to the Procedure:   ACS: Yes.    Non-ACS:    Anginal Classification: CCS IV  Anti-ischemic medical therapy: Minimal Therapy (1 class of medications)  Non-Invasive Test Results: No non-invasive testing performed  Prior CABG: No previous CABG        Theron Arista [facility] 12/29/2015 1:43 PM

## 2015-12-30 ENCOUNTER — Inpatient Hospital Stay (HOSPITAL_COMMUNITY): Payer: Medicaid Other

## 2015-12-30 ENCOUNTER — Ambulatory Visit (HOSPITAL_COMMUNITY): Payer: Medicaid Other

## 2015-12-30 ENCOUNTER — Encounter (HOSPITAL_COMMUNITY): Payer: Self-pay | Admitting: Cardiology

## 2015-12-30 DIAGNOSIS — J449 Chronic obstructive pulmonary disease, unspecified: Secondary | ICD-10-CM

## 2015-12-30 DIAGNOSIS — I25110 Atherosclerotic heart disease of native coronary artery with unstable angina pectoris: Secondary | ICD-10-CM

## 2015-12-30 DIAGNOSIS — Z01818 Encounter for other preprocedural examination: Secondary | ICD-10-CM

## 2015-12-30 DIAGNOSIS — I255 Ischemic cardiomyopathy: Secondary | ICD-10-CM

## 2015-12-30 LAB — CBC
HCT: 38.3 % — ABNORMAL LOW (ref 39.0–52.0)
Hemoglobin: 12.6 g/dL — ABNORMAL LOW (ref 13.0–17.0)
MCH: 32.1 pg (ref 26.0–34.0)
MCHC: 32.9 g/dL (ref 30.0–36.0)
MCV: 97.5 fL (ref 78.0–100.0)
PLATELETS: 293 10*3/uL (ref 150–400)
RBC: 3.93 MIL/uL — ABNORMAL LOW (ref 4.22–5.81)
RDW: 13.2 % (ref 11.5–15.5)
WBC: 11.9 10*3/uL — ABNORMAL HIGH (ref 4.0–10.5)

## 2015-12-30 LAB — BASIC METABOLIC PANEL
Anion gap: 14 (ref 5–15)
BUN: 31 mg/dL — AB (ref 6–20)
CALCIUM: 9.3 mg/dL (ref 8.9–10.3)
CHLORIDE: 97 mmol/L — AB (ref 101–111)
CO2: 28 mmol/L (ref 22–32)
CREATININE: 1.28 mg/dL — AB (ref 0.61–1.24)
GFR calc Af Amer: >60 mL/min (ref >60)
GFR, EST NON AFRICAN AMERICAN: 58 mL/min — AB (ref >60)
Glucose, Bld: 148 mg/dL — ABNORMAL HIGH (ref 65–99)
Potassium: 5 mmol/L (ref 3.5–5.1)
SODIUM: 139 mmol/L (ref 135–145)

## 2015-12-30 LAB — HEPARIN LEVEL (UNFRACTIONATED): HEPARIN UNFRACTIONATED: 0.11 [IU]/mL — AB (ref 0.30–0.70)

## 2015-12-30 MED ORDER — METOPROLOL TARTRATE 25 MG PO TABS
25.0000 mg | ORAL_TABLET | Freq: Two times a day (BID) | ORAL | Status: DC
  Administered 2015-12-30 – 2016-01-06 (×16): 25 mg via ORAL
  Filled 2015-12-30 (×17): qty 1

## 2015-12-30 MED ORDER — METHYLPREDNISOLONE SODIUM SUCC 40 MG IJ SOLR
40.0000 mg | Freq: Two times a day (BID) | INTRAMUSCULAR | Status: DC
  Administered 2015-12-30 – 2016-01-02 (×6): 40 mg via INTRAVENOUS
  Filled 2015-12-30 (×7): qty 1

## 2015-12-30 MED ORDER — HEPARIN BOLUS VIA INFUSION
2000.0000 [IU] | Freq: Once | INTRAVENOUS | Status: AC
  Administered 2015-12-30: 2000 [IU] via INTRAVENOUS
  Filled 2015-12-30: qty 2000

## 2015-12-30 MED ORDER — HEPARIN (PORCINE) IN NACL 100-0.45 UNIT/ML-% IJ SOLN
1250.0000 [IU]/h | INTRAMUSCULAR | Status: DC
  Administered 2015-12-30: 900 [IU]/h via INTRAVENOUS
  Administered 2015-12-31: 1100 [IU]/h via INTRAVENOUS
  Administered 2016-01-01: 1250 [IU]/h via INTRAVENOUS
  Filled 2015-12-30 (×5): qty 250

## 2015-12-30 MED ORDER — HEPARIN BOLUS VIA INFUSION
850.0000 [IU] | Freq: Once | INTRAVENOUS | Status: AC
  Administered 2015-12-30: 850 [IU] via INTRAVENOUS
  Filled 2015-12-30: qty 850

## 2015-12-30 MED ORDER — IPRATROPIUM-ALBUTEROL 0.5-2.5 (3) MG/3ML IN SOLN
3.0000 mL | RESPIRATORY_TRACT | Status: DC | PRN
  Administered 2015-12-31: 3 mL via RESPIRATORY_TRACT
  Filled 2015-12-30: qty 3

## 2015-12-30 NOTE — Progress Notes (Signed)
CARDIAC REHAB PHASE I   PRE:  Rate/Rhythm: 95 SR  BP:  Sitting: 109/95        SaO2: 96 2L  MODE:  Ambulation: 100 ft   POST:  Rate/Rhythm: 112 ST  BP:  Sitting: 116/88         SaO2: 92 4L  Pt somewhat hesitant to ambulate, states he was short of breath ambulating to the bathroom. Pt ambulated 100 ft on 4L O2, IV, foley, gait belt, pt pushed wheelchair, assist x2 for equipment/safety, slow, steady gait, tolerated fairly well. Pt c/o some DOE, sats decreased to 87% on 4L O2, quickly returned to 92% on 4L with deep breathing and brief standing rest. Pt maintained sats on 4L O2 throughout rest of walk. Pt very encouraged after walk, very appreciative. Pt to recliner after walk, call bell within reach. Will follow.   [phone] Joylene Grapes, RN, BSN 12/30/2015 2:55 PM

## 2015-12-30 NOTE — Progress Notes (Signed)
ANTICOAGULATION CONSULT NOTE - Initial Consult  Pharmacy Consult for heparin Indication: three vessel diseases, needs CABG  No Known Allergies  Patient Measurements: Height:  (175.3 cm) Weight: 149 lb 7.6 oz (67.8 kg) IBW/kg (Calculated) : 70.7 Heparin Dosing Weight: 67.8 kg  Vital Signs: Temp: 98.8 F (37.1 C) (02/22 0837) Temp Source: Oral (02/22 0837) BP: 113/95 mmHg (02/22 0800) Pulse Rate: 48 (02/22 0800)  Labs:  Recent Labs  12/27/15 1032 12/27/15 1703 12/27/15 2015  12/28/15 0238 12/28/15 2055 12/29/15 0259 12/29/15 0520 12/29/15 1649 12/30/15 0340  HGB  --   --   --   < > 11.9*  --  11.7* 11.6* 13.4 12.6*  HCT  --   --   --   < > 36.4*  --  35.9* 34.4* 40.3 38.3*  PLT  --   --   --   < > 249  --  281 251 336 293  LABPROT  --   --   --   --   --   --   --  14.9  --   --   INR  --   --   --   --   --   --   --  1.15  --   --   HEPARINUNFRC 0.13*  --   --   --   --  0.53 0.30  --   --   --   CREATININE  --   --   --   < > 1.05  --  1.10 1.02 1.03 1.28*  TROPONINI 1.17* 1.22* 0.92*  --  0.83*  --   --   --   --   --   < > = values in this interval not displayed.  Estimated Creatinine Clearance: 56.6 mL/min (by C-G formula based on Cr of 1.28).   Medical History: Past Medical History  Diagnosis Date  . COPD (chronic obstructive pulmonary disease) (HCC)   . Hypertension   . Hyperlipidemia   . Alcohol abuse     Prior  . Tobacco abuse     Prior    Assessment: 65 yo m admitted with SOB. Patient is s/p cath and found with multivessel disease - will need CABG. Heparin is restarted post cath. Will restart same infusion as before cath as patient was therapeutic. Hgb 12.6, plts 293.   Goal of Therapy:  Heparin level 0.3-0.7 units/ml Monitor platelets by anticoagulation protocol: Yes   Plan:  - Heparin bolus 850 units x 1 - Heparin infusion at 900 units/hr - 6-hr HL @ 1530 - Monitor s/s of bleeding, plans for CABG  Phat Dalton L. Roseanne Reno, PharmD PGY2  Infectious Diseases Pharmacy Resident Pager: [phone] 12/30/2015 11:40 AM

## 2015-12-30 NOTE — Progress Notes (Signed)
Carotid Duplex completed.  Bilateral 1-39% ICA stenosis. Dampened waveforms noted throughout carotid system, possibly cardiac related. Left ECA appears to be stenotic, based on 2:1 ratio.  Farrel Demark, RDMS, RVT 12/30/2015

## 2015-12-30 NOTE — Progress Notes (Signed)
PULMONARY / CRITICAL CARE MEDICINE   Name: CHELSEA NUSZ MRN: [id number] DOB: [birth year]    ADMISSION DATE:  12/26/2015 CONSULTATION DATE:  12/27/15  REFERRING MD:  Dr. David Stall  CHIEF COMPLAINT:  Acute Hypoxic Respiratory Failure  HISTORY OF PRESENT ILLNESS:   65 y/o M with PMH of HTN, HLD, ETOH abuse, tobacco abuse and COPD who presented to [facility] on 12/26/15 with a 24 hour history of worsening shortness of breath, cough, chest tightness, fevers/chilld, and myalgias, RA saturation of 91%.  T CTA of the chest which was negative for PE but demonstrated emphysema, RUL consolidation, small R pleural effusion, dilation of ascending aortia, proximal arch measuring up to 5.2 cm without dissection, and ascending thoracic aortic aneurysm.    The patient was admitted per [facility] for community acquired PNA.  He was treated with IV rocephin / azithromycin.  Due to elevated troponin, Cardiology was consulted for evaluation.  He was placed on a heparin gtt, nitro gtt, plavix, asa, and lopressor.  Cardiology evaluation felt elevated troponin likely due to demand ischemia in the setting of PNA with underlying severe emphysema.    On 2/19, the patient had progressive respiratory distress and hypoxemia.    Intubated and transferred to ICU.     SUBJECTIVE:  No CP SOB improved this am.  Feeling better.  S/P cath yesterday. Cr bump.  WBC coming down.   VITAL SIGNS: BP 113/95 mmHg  Pulse 48  Temp(Src) 97.4 F (36.3 C) (Oral)  Resp 24  Ht  (1.753 m)  Wt 149 lb 7.6 oz (67.8 kg)  BMI 22.06 kg/m2  SpO2 94%  HEMODYNAMICS:    VENTILATOR SETTINGS:    INTAKE / OUTPUT: I/O last 3 completed shifts: In: 1201.3 [I.V.:601.3; IV Piggyback:600] Out: 3630 [Urine:3630]  PHYSICAL EXAMINATION: General:  Thin adult male Neuro:  AAOx3,  HEENT:  MM, NCAT.  Cardiovascular:  s1s2 rrr, difficult to hear murmur, no carotid radiation for me.  Lungs:  Less labored , lungs decreased bilaterally at the bases.   Abdomen:  Soft, bsx4 active Musculoskeletal:  No acute deformities  Skin: no edema, rashes or lesions  LABS:  BMET  Recent Labs Lab 12/29/15 0259 12/29/15 0520 12/29/15 1649 12/30/15 0340  NA 138 139  --  139  K 4.6 4.6  --  5.0  CL 103 105  --  97*  CO2 25 25  --  28  BUN 27* 25*  --  31*  CREATININE 1.10 1.02 1.03 1.28*  GLUCOSE 150* 150*  --  148*    Electrolytes  Recent Labs Lab 12/28/15 1532 12/29/15 0259 12/29/15 0520 12/30/15 0340  CALCIUM  --  9.0 9.0 9.3  MG 2.2  --   --   --   PHOS 2.9  --   --   --     CBC  Recent Labs Lab 12/29/15 0520 12/29/15 1649 12/30/15 0340  WBC 12.6* 14.7* 11.9*  HGB 11.6* 13.4 12.6*  HCT 34.4* 40.3 38.3*  PLT 251 336 293    Coag's  Recent Labs Lab 12/26/15 2156 12/29/15 0520  APTT 32  --   INR 1.16 1.15    Sepsis Markers  Recent Labs Lab 12/26/15 2021 12/26/15 2213 12/27/15 1212  LATICACIDVEN 0.88 0.88 3.7*    ABG  Recent Labs Lab 12/27/15 1420 12/27/15 1855 12/29/15 1407  PHART 7.158* 7.330* 7.409  PCO2ART 64.7* 38.3 36.8  PO2ART 185* 61.7* 58.0*    Liver Enzymes No results for input(s): AST, ALT, ALKPHOS,  BILITOT, ALBUMIN in the last 168 hours.  Cardiac Enzymes  Recent Labs Lab 12/27/15 1703 12/27/15 2015 12/28/15 0238  TROPONINI 1.22* 0.92* 0.83*    Glucose  Recent Labs Lab 12/27/15 0831 12/27/15 1142 12/27/15 1748  GLUCAP 109* 221* 145*    Imaging No results found.   STUDIES:  2/18  CTA Chest >> negative for PE, emphysema, RUL consolidation, small R pleural effusion, dilation of ascending aortia, proximal arch measuring up to 5.2 cm without dissection, and ascending thoracic aortic aneurysm.   CULTURES: Tracheal Aspirate 2/19 >> ng Flu 2/18 >> negative   ANTIBIOTICS: Rocephin 2/18 >>  Azithromycin 2/18 >>  Tamiflu 2/18 >> 2/21  SIGNIFICANT EVENTS: 2/18  Admit with SOB, RUL PNA.  PE ruled out.  Elevated trop leak thought demand ischemia.   2/19   Decompensated requiring intubation due to resp distress  2/19 self extubated  LINES/TUBES: ETT 2/19 >> 2/19  DISCUSSION: 65 y/o M with PMH of emphysema / COPD admitted 2/18 with increased SOB, chest tightness.  Found to have RLL PNA.  Flu negative. Elevated troponin in setting of demand ischemia.  Decompensated on 2/19 requiring brief  intubation.    ASSESSMENT / PLAN:  PULMONARY A: Acute Hypoxic Respiratory Failure - in the setting of RLL PNA, Severe Emphysema with acute exacerbation, possible component of pulmonary edema. Flu negative PCR.  Acute Exacerbation of COPD  Hx Tobacco Abuse  Pneumonia P:   Duoneb Q6 with Q3 PRN albuterol  decrease solumedrol to  IV Q8 >  BID  Continue Abx for pneumonia to finish.  SOB COPD exacerbation, Pneumonia, Severe AS with CAD Needs PFT's and improvement in pulmonary function prior to anticipated cardiac surgery.   CARDIOVASCULAR A:  NSTEMI Severe Aortic Stenosis Aortic Root dilation CAD Frequent PVC's Tachycardia  P:  Cath > CAD with aortic root dilation and severe AS. Will need Open repair of these in the future.  Appreciate Cards, CT surg.   Continue Lopressor for HR control.   Heparin Gtt per cards. No plavix.  Continue lipitor, ASA  RENAL A:   Lactic Acidosis - suspect in setting of respiratory distress Mild AKI - post cath.  P:   Trend BMP / UOP  Replace electrolytes as indicated   INFECTIOUS A:   RUL PNA (NOS)  P:   Empiric tamiflu as above. D/C today.  Droplet precautions Azithro  Rocephin Abx day 5, continue for 7 day course.    Summary - Improved, NSTEMI contributing to pulmonary edema / decompensation, also with severe AS contributing to SOB. Stable for the floor. Anticipating cardiac surgery in the near future, with need for improvement in pulmonary function prior.   Devota Pace, MD Resident - PGY 2

## 2015-12-30 NOTE — Progress Notes (Signed)
SUBJECTIVE: No chest pain. Continued dyspnea. Up in chair.  Tele: sinus tach  BP 107/72 mmHg  Pulse 108  Temp(Src) 98.8 F (37.1 C) (Oral)  Resp 23  Ht  (1.753 m)  Wt 149 lb 7.6 oz (67.8 kg)  BMI 22.06 kg/m2  SpO2 94%  Intake/Output Summary (Last 24 hours) at 12/30/15 1159 Last data filed at 12/30/15 1100  Gross per 24 hour  Intake 971.85 ml  Output   2990 ml  Net -2018.15 ml    PHYSICAL EXAM General: Well developed, well nourished, in no acute distress. Alert and oriented x 3.  Psych:  Good affect, responds appropriately Neck: No JVD. No masses noted.  Lungs: Decreased breath sounds Heart: Tachy with soft systolic murmur noted.  Abdomen: Bowel sounds are present. Soft, non-tender.  Extremities: No lower extremity edema. RFA without hematoma  LABS: Basic Metabolic Panel:  Recent Labs  40/98/11 1532  12/29/15 0520 12/29/15 1649 12/30/15 0340  NA  --   < > 139  --  139  K  --   < > 4.6  --  5.0  CL  --   < > 105  --  97*  CO2  --   < > 25  --  28  GLUCOSE  --   < > 150*  --  148*  BUN  --   < > 25*  --  31*  CREATININE  --   < > 1.02 1.03 1.28*  CALCIUM  --   < > 9.0  --  9.3  MG 2.2  --   --   --   --   PHOS 2.9  --   --   --   --   < > = values in this interval not displayed. CBC:  Recent Labs  12/29/15 1649 12/30/15 0340  WBC 14.7* 11.9*  HGB 13.4 12.6*  HCT 40.3 38.3*  MCV 97.3 97.5  PLT 336 293   Cardiac Enzymes:  Recent Labs  12/27/15 1703 12/27/15 2015 12/28/15 0238  TROPONINI 1.22* 0.92* 0.83*   Current Meds: . antiseptic oral rinse  7 mL Mouth Rinse BID  . atorvastatin  80 mg Oral q1800  . cefTRIAXone (ROCEPHIN)  IV  1 g Intravenous Q24H  . ipratropium-albuterol  3 mL Nebulization Q4H  . methylPREDNISolone (SOLU-MEDROL) injection  40 mg Intravenous Q12H  . metoprolol tartrate  25 mg Oral BID  . sodium chloride flush  3 mL Intravenous Q12H   Echo 12/28/15: Left ventricle: The cavity size was normal. There was  moderate concentric hypertrophy. Systolic function was moderately reduced. The estimated ejection fraction was in the range of 35% to 40%. There is akinesis of the mid-apicalinferolateral myocardium. There is akinesis of the basal-midinferoseptal myocardium. Doppler parameters are consistent with high ventricular filling pressure. - Aortic valve: The AV is heavily calcified and the right coronary cusp is fixed and immobile. The mean gradient and peak velocity are consistent with moderate AS but this is most likely underestimated but LV dysfunction with reduced Cardiac output The calculated AVA is consistent with severe AS and visually the valve mobility is severely reduced. Valve area (VTI): 0.91 cm^2. Valve area (Vmax): 0.82 cm^2. Valve area (Vmean): 0.87 cm^2. - Aorta: Aortic root dimension: 42 mm (ED). - Mitral valve: Calcified annulus. There was mild regurgitation. - Left atrium: The atrium was mildly dilated. - Right ventricle: The RV apex appears akinetic.   ASSESSMENT AND PLAN:  1. NSTEMI/Respiratory failure: Pt admitted  with chest pain, SOB and elevated troponin. He was intubated 12/27/15 due to worsened respiratory status but self extubated 12/28/15. EKG with non-specific T wave inversions. Echo 12/28/15 with LVEF of 35-40%, wall motion abnormalities and likely severe AS. Rt and Lt cath done 12/29/15 revealed left main disease with 3 V CAD. Plan is for CABG/AVR when stable from pulmonary standpoint.  2. Cardiomyopathy: Ischemic. Will need to add ARB when renal stable, slight bump in SCr post cath.   3. Severe AS: new finding on echo 2/20. Will need AVR at time of CABG  4. Dilated aortic root: Will need aortic root replacement at time of CABG/AVR   KILROY,LUKE K PA  2/22/201711:59 AM  I have personally seen and examined this patient with Corine Shelter, PA-C. I agree with the assessment and plan as outlined above. He is admitted with pneumonia/respiratory  failure in setting of COPD. Troponin elevated. Cardiac cath with severe 3 V CAD. He also has severe AS with heavily calcified aortic valve and dilated aortic root and ascending aorta. He has been seen by CT surgery, Dr. Laneta Simmers. Plans for CABG/AVR/aortic root replacement when pulmonary issues are optimized. Would continue IV heparin for now. Plavix has been stopped. Will start ASA and continue beta blocker. I have personally reviewed his labs and telemetry. Would start ARB tomorrow if renal function is stable.   MCALHANY,CHRISTOPHER 12/30/2015 12:57 PM

## 2015-12-30 NOTE — Progress Notes (Signed)
12/30/15  Pharmacy- heparin 1720   Heparin level 0.11  A/P 64yo male awaiting CABG for 3V CAD, heparin resumed s/p cath today with initial heparin level low.  Per d/w RN, there has been no bleeding and no issues with the IV pump.  Will give patient a bolus and increase rate.  1-  Heparin 2000 units IV x 1, change to 1050 units/hr 2-  Repeat heparin level in 8hr 3-  Watch for s/s of bleeding  Marisue Humble, PharmD Clinical Pharmacist [facility]

## 2015-12-31 ENCOUNTER — Ambulatory Visit (HOSPITAL_COMMUNITY): Payer: Medicaid Other

## 2015-12-31 DIAGNOSIS — I350 Nonrheumatic aortic (valve) stenosis: Secondary | ICD-10-CM

## 2015-12-31 DIAGNOSIS — J42 Unspecified chronic bronchitis: Secondary | ICD-10-CM

## 2015-12-31 DIAGNOSIS — I2510 Atherosclerotic heart disease of native coronary artery without angina pectoris: Secondary | ICD-10-CM | POA: Insufficient documentation

## 2015-12-31 DIAGNOSIS — J449 Chronic obstructive pulmonary disease, unspecified: Secondary | ICD-10-CM

## 2015-12-31 DIAGNOSIS — J9600 Acute respiratory failure, unspecified whether with hypoxia or hypercapnia: Secondary | ICD-10-CM | POA: Insufficient documentation

## 2015-12-31 LAB — BASIC METABOLIC PANEL
ANION GAP: 10 (ref 5–15)
BUN: 37 mg/dL — AB (ref 6–20)
CALCIUM: 9.1 mg/dL (ref 8.9–10.3)
CO2: 30 mmol/L (ref 22–32)
Chloride: 99 mmol/L — ABNORMAL LOW (ref 101–111)
Creatinine, Ser: 1.12 mg/dL (ref 0.61–1.24)
GFR calc Af Amer: >60 mL/min (ref >60)
GLUCOSE: 138 mg/dL — AB (ref 65–99)
Potassium: 5.2 mmol/L — ABNORMAL HIGH (ref 3.5–5.1)
SODIUM: 139 mmol/L (ref 135–145)

## 2015-12-31 LAB — CBC
HCT: 37.6 % — ABNORMAL LOW (ref 39.0–52.0)
HEMOGLOBIN: 12.4 g/dL — AB (ref 13.0–17.0)
MCH: 32.2 pg (ref 26.0–34.0)
MCHC: 33 g/dL (ref 30.0–36.0)
MCV: 97.7 fL (ref 78.0–100.0)
Platelets: 306 10*3/uL (ref 150–400)
RBC: 3.85 MIL/uL — ABNORMAL LOW (ref 4.22–5.81)
RDW: 13.2 % (ref 11.5–15.5)
WBC: 10.6 10*3/uL — AB (ref 4.0–10.5)

## 2015-12-31 LAB — HEPARIN LEVEL (UNFRACTIONATED)
HEPARIN UNFRACTIONATED: 0.29 [IU]/mL — AB (ref 0.30–0.70)
HEPARIN UNFRACTIONATED: 0.58 [IU]/mL (ref 0.30–0.70)
Heparin Unfractionated: <0.1 IU/mL — ABNORMAL LOW (ref 0.30–0.70)
Heparin Unfractionated: 0.24 IU/mL — ABNORMAL LOW (ref 0.30–0.70)

## 2015-12-31 MED ORDER — ASPIRIN 81 MG PO CHEW
81.0000 mg | CHEWABLE_TABLET | Freq: Every day | ORAL | Status: DC
  Administered 2015-12-31 – 2016-01-02 (×3): 81 mg via ORAL
  Filled 2015-12-31 (×3): qty 1

## 2015-12-31 MED ORDER — ALUM & MAG HYDROXIDE-SIMETH 200-200-20 MG/5ML PO SUSP
30.0000 mL | ORAL | Status: DC | PRN
  Administered 2015-12-31: 30 mL via ORAL
  Filled 2015-12-31: qty 30

## 2015-12-31 NOTE — Progress Notes (Addendum)
     SUBJECTIVE: No complaints. Breathing is better. No chest pain.  Tele: sinus   BP 109/60 mmHg  Pulse 82  Temp(Src) 97.5 F (36.4 C) (Oral)  Resp 20  Ht  (1.753 m)  Wt 149 lb 7.6 oz (67.8 kg)  BMI 22.06 kg/m2  SpO2 98%  Intake/Output Summary (Last 24 hours) at 12/31/15 9563 Last data filed at 12/31/15 0600  Gross per 24 hour  Intake 2153.36 ml  Output   1075 ml  Net 1078.36 ml    PHYSICAL EXAM General: Well developed, well nourished, in no acute distress. Alert and oriented x 3.  Psych:  Good affect, responds appropriately Neck: No JVD. No masses noted.  Lungs: Clear bilaterally with no wheezes or rhonci noted.  Heart: RRR systolic murmur noted.  Abdomen: Bowel sounds are present. Soft, non-tender.  Extremities: No lower extremity edema.   LABS: Basic Metabolic Panel:  Recent Labs  87/56/43 1532  12/30/15 0340 12/31/15 0142  NA  --   < > 139 139  K  --   < > 5.0 5.2*  CL  --   < > 97* 99*  CO2  --   < > 28 30  GLUCOSE  --   < > 148* 138*  BUN  --   < > 31* 37*  CREATININE  --   < > 1.28* 1.12  CALCIUM  --   < > 9.3 9.1  MG 2.2  --   --   --   PHOS 2.9  --   --   --   < > = values in this interval not displayed. CBC:  Recent Labs  12/30/15 0340 12/31/15 0142  WBC 11.9* 10.6*  HGB 12.6* 12.4*  HCT 38.3* 37.6*  MCV 97.5 97.7  PLT 293 306    Current Meds: . antiseptic oral rinse  7 mL Mouth Rinse BID  . atorvastatin  80 mg Oral q1800  . cefTRIAXone (ROCEPHIN)  IV  1 g Intravenous Q24H  . methylPREDNISolone (SOLU-MEDROL) injection  40 mg Intravenous Q12H  . metoprolol tartrate  25 mg Oral BID  . sodium chloride flush  3 mL Intravenous Q12H     ASSESSMENT AND PLAN:  1. NSTEMI/Respiratory failure: Pt admitted with chest pain, SOB and elevated troponin. He was intubated 12/27/15 due to worsened respiratory status but self extubated 12/28/15. EKG with non-specific T wave inversions. Echo 12/28/15 with LVEF of 35-40%, wall motion abnormalities  and likely severe AS. 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 Plan is for CABG/AVR when stable from pulmonary standpoint. Will add ASA back today.   2. Cardiomyopathy: Ischemic. Continue beta blocker. Start Ace-inh but would wait given elevated K.     3. Severe AS: new finding on echo 2/20. Will need AVR at time of CABG  4. Dilated aortic root: Will need aortic root replacement at time of CABG/AVR   MCALHANY,CHRISTOPHER  2/23/20177:09 AM

## 2015-12-31 NOTE — Progress Notes (Signed)
Patient sitting in chair, oriented to room. Call light within reach. Will continue to monitor.

## 2015-12-31 NOTE — Progress Notes (Signed)
ANTICOAGULATION CONSULT NOTE  Pharmacy Consult for heparin Indication: ACS bridge to CABG  No Known Allergies  Patient Measurements: Height:  (175.3 cm) Weight: 149 lb 7.6 oz (67.8 kg) IBW/kg (Calculated) : 70.7 Heparin Dosing Weight: 67.8 kg  Vital Signs: Temp: 98.2 F (36.8 C) (02/23 1540) Temp Source: Oral (02/23 1540) BP: 109/82 mmHg (02/23 1415) Pulse Rate: 99 (02/23 1415)  Labs:  Recent Labs  12/29/15 0520 12/29/15 1649 12/30/15 0340  12/31/15 0142 12/31/15 0907 12/31/15 1556  HGB 11.6* 13.4 12.6*  --  12.4*  --   --   HCT 34.4* 40.3 38.3*  --  37.6*  --   --   PLT 251 336 293  --  306  --   --   LABPROT 14.9  --   --   --   --   --   --   INR 1.15  --   --   --   --   --   --   HEPARINUNFRC  --   --   --   < > 0.29* <0.10* 0.24*  CREATININE 1.02 1.03 1.28*  --  1.12  --   --   < > = values in this interval not displayed.  Estimated Creatinine Clearance: 64.7 mL/min (by C-G formula based on Cr of 1.12).   Medical History: Past Medical History  Diagnosis Date  . COPD (chronic obstructive pulmonary disease) (HCC)   . Hypertension   . Hyperlipidemia   . Alcohol abuse     Prior  . Tobacco abuse     Prior    Assessment: 65 yo m admitted with SOB. Patient is s/p cath, was found to have  multivessel disease - will need CABG. Heparin has been restarted s/p cath.   Heparin level is sub-therapeutic at 0.24 on 1100 units/hr.   Goal of Therapy:  Heparin level 0.3-0.7 units/ml Monitor platelets by anticoagulation protocol: Yes   Plan:  Increase heparin to 1250 units/hr.  Repeat heparin level in 6 hours.  Daily HL/CBC  Link Snuffer, PharmD, BCPS Clinical Pharmacist [phone] 12/31/2015 4:41 PM

## 2015-12-31 NOTE — Progress Notes (Signed)
ANTICOAGULATION CONSULT NOTE  Pharmacy Consult for heparin Indication: three vessel diseases, needs CABG  No Known Allergies  Patient Measurements: Height:  (175.3 cm) Weight: 149 lb 7.6 oz (67.8 kg) IBW/kg (Calculated) : 70.7 Heparin Dosing Weight: 67.8 kg  Vital Signs: Temp: 97.5 F (36.4 C) (02/23 0001) Temp Source: Oral (02/23 0001) BP: 107/75 mmHg (02/23 0000) Pulse Rate: 74 (02/23 0200)  Labs:  Recent Labs  12/29/15 0259 12/29/15 0520 12/29/15 1649 12/30/15 0340 12/30/15 1605 12/31/15 0142  HGB 11.7* 11.6* 13.4 12.6*  --  12.4*  HCT 35.9* 34.4* 40.3 38.3*  --  37.6*  PLT 281 251 336 293  --  306  LABPROT  --  14.9  --   --   --   --   INR  --  1.15  --   --   --   --   HEPARINUNFRC 0.30  --   --   --  0.11* 0.29*  CREATININE 1.10 1.02 1.03 1.28*  --  1.12    Estimated Creatinine Clearance: 64.7 mL/min (by C-G formula based on Cr of 1.12).   Medical History: Past Medical History  Diagnosis Date  . COPD (chronic obstructive pulmonary disease) (HCC)   . Hypertension   . Hyperlipidemia   . Alcohol abuse     Prior  . Tobacco abuse     Prior    Assessment: 65 yo m admitted with SOB. Patient is s/p cath and found with multivessel disease - will need CABG. Heparin is restarted post cath. Will restart same infusion as before cath as patient was therapeutic. Hgb 12.6, plts 293.   F/u HL is slightly subtherapeutic at 0.29 on heparin 1050 units/hr. Nurse reports no issues with infusion or bleeding.  Goal of Therapy:  Heparin level 0.3-0.7 units/ml Monitor platelets by anticoagulation protocol: Yes   Plan:  Increase heparin to 1100 units/hr 6h HL Daily HL/CBC Monitor s/s of bleeding, plans for CABG  Arlean Hopping. Newman Pies, PharmD, BCPS Clinical Pharmacist Pager [phone]  12/31/2015 2:51 AM

## 2015-12-31 NOTE — Progress Notes (Signed)
[facility] PROGRESS NOTE  Craig [name] [facility] WUJ:[id number] DOB: [birth year] DOA: 12/26/2015 PCP: Rose Fillers, PA-C  Length of stay: 5   Assessment/Plan: Principal Problem:   Acute respiratory failure with hypoxia (HCC) Active Problems:   CAP (community acquired pneumonia)   NSTEMI (non-ST elevated myocardial infarction) (HCC)   COPD (chronic obstructive pulmonary disease) (HCC)   Hypoxia   Elevated troponin   History of hypertension   History of hyperlipidemia   Ascending aortic aneurysm (HCC)   Aortic stenosis   Acute respiratory failure (HCC)   CAD (coronary artery disease)   Brief summary  65 year old gentleman with a long heavy smoking history until 2010, COPD, hypertension and hyperlipidemia who reports being in his usual state of health with chronic exertional dyspnea and occasional episodes of dull chest aching until Monday a week ago. He then started having fever, chills, cough productive of white sputum and dyspnea. This continued to progress  .He began having chest discomfort and on Friday the severity of his symptoms prompted a visit to the ER. His CTA in the ER showed no PE. It did show significant emphysematous changes and consolidation in the right lung base as well as an area of interstitial thickening in the right apical region concerning for pneumonia. There was also diffuse dilation of the ascending aorta with involvement of the aortic arch with a maximum diameter of 5.2 cm.  . His initial troponin was 0.91, then 2.32. EKG demonstrated variable sinus tachycardia   transient inferior ST-T abnormalities, & supraventricular as well as ventricular ectopy. He was hypoxic in the ER with sats of 91% and was felt to have pneumonia. He was admitted. The following day he was seen by CCM for acute respiratory failure with hypercarbia and hypoxemia and was given a trial of bipap which he did not tolerate and was intubated and transferred to the MICU. A couple hours  later he extubated himself and has remained stable. An echo   showed a heavily calcified aortic valve with a mean gradient of 23 mm Hg and a peak of 60 mm Hg., moderate LV dysfunction with an EF of 35-40% with inferolateral and inferoseptal akinesis. He was loaded with Plavix. Cardiac cath today showed 50% ostial LM stenosis and 3-vessel CAD with moderate pulmonary hypertension . Patient being transferred to [facility] on 2/23.  Assessment and plan Acute hypoxemic respiratory failure secondary to right lower lobe pneumonia, flu negative, patient status post receiving empiric Tamiflu 2/18-2/21. intubated 2/19 due to worsening respiratory failure, extubated 2/20, abnormal troponin in the setting of demand ischemia, patient also found to have hypercarbia due to COPD exacerbation which has resolved, continue steroid taper, bronchodilators, patient will need boundary function testing for clearance prior to surgery  Acute pulmonary edema-resolved, may have been related to severe AS and tachycardia, CABG/ AVR planned, Echo 12/28/15 with LVEF of 35-40%, wall motion abnormalities and likely severe AS,Right and left heart cath done 12/29/15 revealed left main disease with 3 V CAD. Dr. Laneta Simmers. Plans for CABG/AVR/aortic root replacement when pulmonary issues are optimized. Would continue IV heparin for now. Plavix has been stopped. Will start ASA and continue beta blocker  Right lower lobe CAP- neg respiratory culture and respiratory viral panel neg Complete ceftriaxone x 7ds and azithromycin, started 2/18  Acute COPD exacerbation-Duoneb Q6 with Q3 PRN albuterol , continue solumedrol to  IV Q12 >  BID ,Continue Abx for pneumonia  until 2/25 Needs PFT's and improvement in pulmonary function  prior to anticipated cardiac surgery.    DVT prophylaxsis heparin drip  Code Status:      Code Status Orders        Start     Ordered   12/27/15 0058  Full code   Continuous     12/27/15 0057    Code Status  History    Date Active Date Inactive Code Status Order ID Comments User Context   This patient has a current code status but no historical code status.      Family Communication: Discussed in detail with the patient, all imaging results, lab results explained to the patient   Disposition Plan:  Transfer patient to telemetry     Consultants:  Cardiology  Cardiothoracic surgery  Critical care    Procedures:     Antibiotics: Anti-infectives    Start     Dose/Rate Route Frequency Ordered Stop   12/27/15 2200  cefTRIAXone (ROCEPHIN) 1 g in dextrose 5 % 50 mL IVPB     1 g 100 mL/hr over 30 Minutes Intravenous Every 24 hours 12/26/15 2140 01/01/16 2359   12/27/15 2200  azithromycin (ZITHROMAX) 500 mg in dextrose 5 % 250 mL IVPB  Status:  Discontinued     500 mg 250 mL/hr over 60 Minutes Intravenous Every 24 hours 12/26/15 2141 12/30/15 1142   12/26/15 2115  cefTRIAXone (ROCEPHIN) 1 g in dextrose 5 % 50 mL IVPB     1 g 100 mL/hr over 30 Minutes Intravenous  Once 12/26/15 2100 12/26/15 2245   12/26/15 2115  azithromycin (ZITHROMAX) 500 mg in dextrose 5 % 250 mL IVPB     500 mg 250 mL/hr over 60 Minutes Intravenous  Once 12/26/15 2100 12/27/15 0028   12/26/15 2115  oseltamivir (TAMIFLU) capsule 75 mg     75 mg Oral  Once 12/26/15 2100 12/26/15 2155         HPI/Subjective: Patient sitting comfortably in the chair, denies any chest pain or shortness of breath  Objective: Filed Vitals:   12/31/15 0828 12/31/15 0842 12/31/15 0947 12/31/15 1000  BP:   100/68   Pulse:   102 105  Temp: 97.4 F (36.3 C)     TempSrc: Oral     Resp:   21 20  Height:      Weight:      SpO2:  99% 94% 91%    Intake/Output Summary (Last 24 hours) at 12/31/15 1143 Last data filed at 12/31/15 1100  Gross per 24 hour  Intake 2065.51 ml  Output   1200 ml  Net 865.51 ml    Exam:  General: Well developed, well nourished, in no acute distress. Alert and oriented x 3.  Psych: Good  affect, responds appropriately Neck: No JVD. No masses noted.  Lungs: Clear bilaterally with no wheezes or rhonci noted.  Heart: RRR systolic murmur noted.  Abdomen: Bowel sounds are present. Soft, non-tender.  Extremities: No lower extremity edema.   Data Review   Micro Results Recent Results (from the past 240 hour(s))  MRSA PCR Screening     Status: None   Collection Time: 12/27/15 12:52 AM  Result Value Ref Range Status   MRSA by PCR NEGATIVE NEGATIVE Final    Comment:        The GeneXpert MRSA Assay (FDA approved for NASAL specimens only), is one component of a comprehensive MRSA colonization surveillance program. It is not intended to diagnose MRSA infection nor to guide or monitor treatment for MRSA infections.  Culture, respiratory (NON-Expectorated)     Status: None   Collection Time: 12/27/15  2:12 PM  Result Value Ref Range Status   Specimen Description TRACHEAL ASPIRATE  Final   Special Requests NONE  Final   Gram Stain   Final    FEW WBC PRESENT,BOTH PMN AND MONONUCLEAR NO SQUAMOUS EPITHELIAL CELLS SEEN NO ORGANISMS SEEN Performed at [facility]    Culture   Final    NORMAL OROPHARYNGEAL FLORA Performed at [facility]    Report Status 12/29/2015 FINAL  Final  Respiratory virus panel     Status: None   Collection Time: 12/27/15  5:27 PM  Result Value Ref Range Status   Source - RVPAN NASAL SWAB  Corrected   Respiratory Syncytial Virus A Negative Negative Final   Respiratory Syncytial Virus B Negative Negative Final   Influenza A Negative Negative Final   Influenza B Negative Negative Final   Parainfluenza 1 Negative Negative Final   Parainfluenza 2 Negative Negative Final   Parainfluenza 3 Negative Negative Final   Metapneumovirus Negative Negative Final   Rhinovirus Negative Negative Final   Adenovirus Negative Negative Final    Comment: (NOTE) Performed At: [facility] [address] [place], [place]  [id number] Mila Homer MD WU:[id number]     Radiology Reports Dg Chest 2 View  12/26/2015  CLINICAL DATA:  Shortness of breath yesterday, progressive today. Occasional cough. EXAM: CHEST  2 VIEW COMPARISON:  Most recent chest radiograph 05/27/2009 FINDINGS: Advanced emphysematous change with hyperinflation. Diffuse increased interstitial markings may reflect superimposed pulmonary edema. Blunting of the costophrenic angles, right greater than left, pleural effusions versus hyperinflation. Patchy airspace disease in the posterior right upper lobe. Heart is normal in size, mediastinal contours are normal. No pneumothorax. Remote bilateral rib fractures. IMPRESSION: 1. Patchy right upper lobe opacity, concerning for pneumonia. Followup PA and lateral chest X-ray is recommended in 3-4 weeks following trial of antibiotic therapy to ensure resolution and exclude underlying malignancy. 2. Advanced emphysema. Increased interstitial markings from prior exam may reflect superimposed edema. Small pleural effusions versus hyperinflation. Question degree of volume overload/CHF, with concurrent pneumonia, all superimposed on chronic lung disease. Electronically Signed   By: Rubye Oaks M.D.   On: 12/26/2015 18:11   Ct Angio Chest Pe W/cm &/or Wo Cm  12/26/2015  CLINICAL DATA:  65 year old male with shortness of breath copy, tachycardia. EXAM: CT ANGIOGRAPHY CHEST WITH CONTRAST TECHNIQUE: Multidetector CT imaging of the chest was performed using the standard protocol during bolus administration of intravenous contrast. Multiplanar CT image reconstructions and MIPs were obtained to evaluate the vascular anatomy. CONTRAST:  80mL OMNIPAQUE IOHEXOL 350 MG/ML SOLN COMPARISON:  Radiograph dated 12/26/2015 FINDINGS: There is emphysematous changes of the lungs. Patchy area of consolidative changes at the right lung base as well as area of interstitial thickening in the right apical region are concerning for pneumonia.  There is a small right pleural effusion. No pneumothorax. The central airways are patent. There is mild bronchiectatic changes with mild thickening of the bronchial wall. There is diffuse dilatation of the ascending aorta with involvement of the proximal arch measuring up to 5.2 cm in diameter. Evaluation of the aorta is limited as timing of the contrast was designed for optimal opacification of the pulmonary arteries. No definite dissection identified. There is no periaortic fluid. There is atherosclerotic calcification of thoracic aorta. No CT evidence of pulmonary embolism. There is no cardiomegaly or significant pericardial effusion. There is coronary vascular calcification. There  is calcification of the aortic valves. There is thickened appearance of the left ventricular myocardium, likely myocardial hypertrophy. Mild dilatation of the left atrium. Correlation with echocardiogram recommended. There is retrograde flow of contrast from the right atrium into the IVC compatible with a degree of right cardiac dysfunction. Right hilar and subcarinal adenopathy. The esophagus and the thyroid gland are grossly unremarkable. There is no axillary adenopathy. The chest wall soft tissues appear unremarkable. Old healed bilateral rib fractures. No acute fracture. The visualized upper abdomen appear grossly unremarkable. Review of the MIP images confirms the above findings. IMPRESSION: No CT evidence of pulmonary embolism. Emphysema with an area of consolidative change in the right lung base compatible with pneumonia. Small right pleural effusion. Diffuse dilatation of the ascending aorta and proximal arch measuring up to 5.2 cm. Evaluation of the aorta is limited on this study as timing of the contrast was designed to opacifying the pulmonary vasculature. No definite dissection identified. CT angiography with optimal opacification of the aorta may provide additional evaluation. Ascending thoracic aortic aneurysm. Recommend  semi-annual imaging followup by CTA or MRA and referral to cardiothoracic surgery if not already obtained. This recommendation follows 2010 ACCF/AHA/AATS/ACR/ASA/SCA/SCAI/SIR/STS/SVM Guidelines for the Diagnosis and Management of Patients With Thoracic Aortic Disease. Circulation. 2010; 121: W098-J191 Calcification of the aortic valves with probable resulting aortic valve stenosis. Dilatation of the ascending aorta may represent post stenotic aneurysm. There is also left ventricular hypertrophy and mild dilatation of the left atrium. Echocardiogram is recommended for further evaluation. Electronically Signed   By: Elgie Collard M.D.   On: 12/26/2015 21:19   Dg Chest Port 1 View  12/28/2015  CLINICAL DATA:  Acute respiratory failure. The patient extubated himself yesterday. EXAM: PORTABLE CHEST 1 VIEW COMPARISON:  Yesterday. FINDINGS: The cardiac silhouette remains borderline enlarged. Decreased bilateral airspace opacity. Stable bilateral pleural effusions and prominence of the pulmonary vasculature and interstitial markings. No acute bony abnormality. IMPRESSION: 1. Improving congestive heart failure superimposed on COPD. 2. Persistent probable atelectasis at the right lung base and mild left basilar atelectasis. Electronically Signed   By: Beckie Salts M.D.   On: 12/28/2015 07:14   Dg Chest Port 1 View  12/27/2015  CLINICAL DATA:  Intubated. EXAM: PORTABLE CHEST 1 VIEW COMPARISON:  Yesterday. FINDINGS: Borderline enlarged cardiac silhouette with a mild increase in size. Increased airspace opacity throughout the right lung and the majority of the left lung. Increased size of small bilateral pleural effusions. Endotracheal tube in satisfactory position. Nasogastric tube tip in the mid stomach. Diffuse osteopenia. IMPRESSION: Worsening changes of congestive heart failure superimposed on COPD. Underlying pneumonia cannot be excluded. Electronically Signed   By: Beckie Salts M.D.   On: 12/27/2015 13:42   Dg  Abd Portable 1v  12/27/2015  CLINICAL DATA:  Encounter for orogastric tube placement. EXAM: PORTABLE ABDOMEN - 1 VIEW COMPARISON:  None. FINDINGS: Oral/nasogastric tube passes below the diaphragm extending well into the stomach. Tip lies in the mid to distal stomach. Normal bowel gas pattern. IMPRESSION: Oral/nasogastric tube is well positioned extending well into the stomach. Electronically Signed   By: Amie Portland M.D.   On: 12/27/2015 13:44     CBC  Recent Labs Lab 12/29/15 0259 12/29/15 0520 12/29/15 1649 12/30/15 0340 12/31/15 0142  WBC 15.4* 12.6* 14.7* 11.9* 10.6*  HGB 11.7* 11.6* 13.4 12.6* 12.4*  HCT 35.9* 34.4* 40.3 38.3* 37.6*  PLT 281 251 336 293 306  MCV 97.3 98.0 97.3 97.5 97.7  MCH 31.7 33.0 32.4 32.1  32.2  MCHC 32.6 33.7 33.3 32.9 33.0  RDW 13.0 13.1 13.1 13.2 13.2    Chemistries   Recent Labs Lab 12/28/15 0238 12/28/15 1532 12/29/15 0259 12/29/15 0520 12/29/15 1649 12/30/15 0340 12/31/15 0142  NA 137  --  138 139  --  139 139  K 4.9  --  4.6 4.6  --  5.0 5.2*  CL 104  --  103 105  --  97* 99*  CO2 23  --  25 25  --  28 30  GLUCOSE 184*  --  150* 150*  --  148* 138*  BUN 21*  --  27* 25*  --  31* 37*  CREATININE 1.05  --  1.10 1.02 1.03 1.28* 1.12  CALCIUM 8.9  --  9.0 9.0  --  9.3 9.1  MG  --  2.2  --   --   --   --   --    ------------------------------------------------------------------------------------------------------------------ estimated creatinine clearance is 64.7 mL/min (by C-G formula based on Cr of 1.12). ------------------------------------------------------------------------------------------------------------------ No results for input(s): HGBA1C in the last 72 hours. ------------------------------------------------------------------------------------------------------------------ No results for input(s): CHOL, HDL, LDLCALC, TRIG, CHOLHDL, LDLDIRECT in the last 72  hours. ------------------------------------------------------------------------------------------------------------------ No results for input(s): TSH, T4TOTAL, T3FREE, THYROIDAB in the last 72 hours.  Invalid input(s): FREET3 ------------------------------------------------------------------------------------------------------------------ No results for input(s): VITAMINB12, FOLATE, FERRITIN, TIBC, IRON, RETICCTPCT in the last 72 hours.  Coagulation profile  Recent Labs Lab 12/26/15 2156 12/29/15 0520  INR 1.16 1.15    No results for input(s): DDIMER in the last 72 hours.  Cardiac Enzymes  Recent Labs Lab 12/27/15 1703 12/27/15 2015 12/28/15 0238  TROPONINI 1.22* 0.92* 0.83*   ------------------------------------------------------------------------------------------------------------------ Invalid input(s): POCBNP   CBG:  Recent Labs Lab 12/27/15 0831 12/27/15 1142 12/27/15 1748  GLUCAP 109* 221* 145*       Studies: No results found.    Lab Results  Component Value Date   HGBA1C  05/28/2009    5.7 (NOTE) The ADA recommends the following therapeutic goal for glycemic control related to Hgb A1c measurement: Goal of therapy: <6.5 Hgb A1c  Reference: American Diabetes Association: Clinical Practice Recommendations 2010, Diabetes Care, 2010, 33: (Suppl  1).   Lab Results  Component Value Date   LDLCALC 117* 12/27/2015   CREATININE 1.12 12/31/2015       Scheduled Meds: . antiseptic oral rinse  7 mL Mouth Rinse BID  . aspirin  81 mg Oral Daily  . atorvastatin  80 mg Oral q1800  . cefTRIAXone (ROCEPHIN)  IV  1 g Intravenous Q24H  . methylPREDNISolone (SOLU-MEDROL) injection  40 mg Intravenous Q12H  . metoprolol tartrate  25 mg Oral BID  . sodium chloride flush  3 mL Intravenous Q12H   Continuous Infusions: . heparin 1,100 Units/hr (12/31/15 0505)    Principal Problem:   Acute respiratory failure with hypoxia (HCC) Active Problems:   CAP  (community acquired pneumonia)   NSTEMI (non-ST elevated myocardial infarction) (HCC)   COPD (chronic obstructive pulmonary disease) (HCC)   Hypoxia   Elevated troponin   History of hypertension   History of hyperlipidemia   Ascending aortic aneurysm (HCC)   Aortic stenosis   Acute respiratory failure (HCC)   CAD (coronary artery disease)    Time spent: 45 minutes   [facility]  [facility] Pager [phone]. If 7PM-7AM, please contact night-coverage at [email or web address], password [facility] 12/31/2015, 11:43 AM  LOS: 5 days

## 2015-12-31 NOTE — Progress Notes (Signed)
CARDIAC REHAB PHASE I   PRE:  Rate/Rhythm: 112 ST  BP:  Sitting: 109/82        SaO2: 95 2L  MODE:  Ambulation: 140 ft   POST:  Rate/Rhythm: 109 ST  BP:  Sitting: 104/87         SaO2: 95 4L  Pt ambulated 140 ft on 2-4L O2, rolling walker, IV, steady gait, tolerated well, no complaints. Pt's O2 sats halfway through walk 87% on 2L O2, increased to 88% on 2L after brief standing rest. Oxygen increased to 4L, sats improved to 94% on 4L O2 with ambulation. Pt sats 95% on 2L O2 at rest upon return to room. Pre-op education completed with pt and pt's sister at bedside. Reviewed IS, sternal precautions, activity progression, cardiac surgery booklet and cardiac surgery guidelines. Left instructions to view cardiac surgery videos, pt verbalized understanding. Pt and sister stated concern that pt does not have insurance. Pt will need case manager/social work consult prior to discharge. Pt to recliner after walk, call bell within reach. Will follow.   [id number] Joylene Grapes, RN, BSN 12/31/2015 3:25 PM

## 2015-12-31 NOTE — Progress Notes (Signed)
  Subjective:  Feels better. Up walking around. Minimal brownish sputum. Sats 95% on 2L.  Objective: Vital signs in last 24 hours: Temp:  [97.4 F (36.3 C)-98.5 F (36.9 C)] 98.2 F (36.8 C) (02/23 1540) Pulse Rate:  [74-121] 99 (02/23 1415) Cardiac Rhythm:  [-] Normal sinus rhythm (02/23 1906) Resp:  [12-25] 18 (02/23 1415) BP: (100-122)/(60-99) 109/82 mmHg (02/23 1415) SpO2:  [91 %-100 %] 95 % (02/23 1415) FiO2 (%):  [2 %] 2 % (02/23 1150)  Hemodynamic parameters for last 24 hours:    Intake/Output from previous day: 02/22 0701 - 02/23 0700 In: 2153.4 [P.O.:1680; I.V.:423.4; IV Piggyback:50] Out: 1075 [Urine:1075] Intake/Output this shift:    General appearance: alert and cooperative Heart: regular rate and rhythm and soft systolic murmur along RSB Lungs: diminished breath sounds bibasilar  Lab Results:  Recent Labs  12/30/15 0340 12/31/15 0142  WBC 11.9* 10.6*  HGB 12.6* 12.4*  HCT 38.3* 37.6*  PLT 293 306   BMET:  Recent Labs  12/30/15 0340 12/31/15 0142  NA 139 139  K 5.0 5.2*  CL 97* 99*  CO2 28 30  GLUCOSE 148* 138*  BUN 31* 37*  CREATININE 1.28* 1.12  CALCIUM 9.3 9.1    PT/INR:  Recent Labs  12/29/15 0520  LABPROT 14.9  INR 1.15   ABG    Component Value Date/Time   PHART 7.409 12/29/2015 1407   HCO3 25.1* 12/29/2015 1415   TCO2 26 12/29/2015 1415   ACIDBASEDEF 1.0 12/29/2015 1415   O2SAT 57.0 12/29/2015 1415   CBG (last 3)  No results for input(s): GLUCAP in the last 72 hours.  Assessment/Plan:  Severe multi-vessel coronary disease with moderate LV dysfunction and LVEF of 35-40%.  Probably severe AS: His gradient is in the moderate range but his valve looks very tight on echo. He does not have much of a murmur which is unusual but I guess it could be because he has severe AS and reduced EF.  Aortic root, ascending and arch aneurysm.  Right lung pneumonia and respiratory failure which could have been precipitated by heart  failure in this gentleman with a long prior smoking history and COPD with chronic exertional dyspnea.   He will need CABG, Bentall procedure, ascending aorta and possibly arch replacement. This is a huge operation especially with COPD, LV dysfunction and he will have the best result if his lungs are cleared up prior to surgery. He also has to be off Plavix for 7 days prior to this. I will tentatively plan surgery next Thursday. I discussed the surgery again with him and his sister by phone. I also discussed the pros and cons of mechanical and tissue valves and we will continue to discuss this to make the best valve choice for him.  LOS: 5 days    Craig [name] 12/31/2015

## 2015-12-31 NOTE — Progress Notes (Signed)
ANTICOAGULATION CONSULT NOTE  Pharmacy Consult for heparin Indication: ACS bridge to CABG  No Known Allergies  Patient Measurements: Height:  (175.3 cm) Weight: 149 lb 7.6 oz (67.8 kg) IBW/kg (Calculated) : 70.7 Heparin Dosing Weight: 67.8 kg  Vital Signs: Temp: 98.5 F (36.9 C) (02/23 1207) Temp Source: Oral (02/23 1207) BP: 109/82 mmHg (02/23 1415) Pulse Rate: 99 (02/23 1415)  Labs:  Recent Labs  12/29/15 0520 12/29/15 1649 12/30/15 0340 12/30/15 1605 12/31/15 0142 12/31/15 0907  HGB 11.6* 13.4 12.6*  --  12.4*  --   HCT 34.4* 40.3 38.3*  --  37.6*  --   PLT 251 336 293  --  306  --   LABPROT 14.9  --   --   --   --   --   INR 1.15  --   --   --   --   --   HEPARINUNFRC  --   --   --  0.11* 0.29* <0.10*  CREATININE 1.02 1.03 1.28*  --  1.12  --     Estimated Creatinine Clearance: 64.7 mL/min (by C-G formula based on Cr of 1.12).   Medical History: Past Medical History  Diagnosis Date  . COPD (chronic obstructive pulmonary disease) (HCC)   . Hypertension   . Hyperlipidemia   . Alcohol abuse     Prior  . Tobacco abuse     Prior    Assessment: 65 yo m admitted with SOB. Patient is s/p cath, was found to have  multivessel disease - will need CABG. Heparin has been restarted s/p cath. Heparin level (undectable) from this morning is not accurate. Heparin was inadvertently, not infusing into the patient for an unknown amount of time. Heparin appropriately restarted around 0800 this morning.   Goal of Therapy:  Heparin level 0.3-0.7 units/ml Monitor platelets by anticoagulation protocol: Yes   Plan:  Continue heparin at 1100 units/hr Daily HL/CBC F/u afternoon heparin level    Agapito Games, PharmD, BCPS Clinical Pharmacist Pager: [phone] 12/31/2015 2:47 PM

## 2015-12-31 NOTE — Care Management Note (Addendum)
Case Management Note  Patient Details  Name: Craig [name] MRN: [id number] Date of Birth: 03/16/1951  Subjective/Objective:        Pt admitted with Acute Respiratory Failure -  Pt is now extubated on [place]     Action/Plan:  Pt is independent from home with son Craig [name].  Pt is without insurance; will need PCP initiated at [facility] prior to discharge. CM will continue to monitor for disposition needs   Expected Discharge Date:                  Expected Discharge Plan:  Home/Self Care  In-House Referral:     Discharge planning Services  CM Consult  Post Acute Care Choice:    Choice offered to:     DME Arranged:    DME Agency:     HH Arranged:    HH Agency:     Status of Service:  In process, will continue to follow  Medicare Important Message Given:    Date Medicare IM Given:    Medicare IM give by:    Date Additional Medicare IM Given:    Additional Medicare Important Message give by:     If discussed at Long Length of Stay Meetings, dates discussed:    Additional Comments:  Cherylann Parr, RN 12/31/2015, 9:30 AM

## 2016-01-01 ENCOUNTER — Ambulatory Visit (HOSPITAL_COMMUNITY): Payer: Medicaid Other

## 2016-01-01 ENCOUNTER — Encounter (HOSPITAL_COMMUNITY): Payer: Self-pay

## 2016-01-01 DIAGNOSIS — I25110 Atherosclerotic heart disease of native coronary artery with unstable angina pectoris: Secondary | ICD-10-CM | POA: Insufficient documentation

## 2016-01-01 DIAGNOSIS — J440 Chronic obstructive pulmonary disease with acute lower respiratory infection: Secondary | ICD-10-CM

## 2016-01-01 DIAGNOSIS — I2510 Atherosclerotic heart disease of native coronary artery without angina pectoris: Secondary | ICD-10-CM

## 2016-01-01 DIAGNOSIS — I350 Nonrheumatic aortic (valve) stenosis: Secondary | ICD-10-CM

## 2016-01-01 LAB — COMPREHENSIVE METABOLIC PANEL
ALT: 31 U/L (ref 17–63)
AST: 36 U/L (ref 15–41)
Albumin: 2.9 g/dL — ABNORMAL LOW (ref 3.5–5.0)
Alkaline Phosphatase: 50 U/L (ref 38–126)
Anion gap: 8 (ref 5–15)
BUN: 33 mg/dL — AB (ref 6–20)
CHLORIDE: 99 mmol/L — AB (ref 101–111)
CO2: 29 mmol/L (ref 22–32)
CREATININE: 0.96 mg/dL (ref 0.61–1.24)
Calcium: 8.9 mg/dL (ref 8.9–10.3)
GFR calc Af Amer: >60 mL/min (ref >60)
GFR calc non Af Amer: >60 mL/min (ref >60)
GLUCOSE: 136 mg/dL — AB (ref 65–99)
Potassium: 5.6 mmol/L — ABNORMAL HIGH (ref 3.5–5.1)
SODIUM: 136 mmol/L (ref 135–145)
Total Bilirubin: 0.2 mg/dL — ABNORMAL LOW (ref 0.3–1.2)
Total Protein: 5.8 g/dL — ABNORMAL LOW (ref 6.5–8.1)

## 2016-01-01 LAB — CBC
HEMATOCRIT: 34.5 % — AB (ref 39.0–52.0)
HEMOGLOBIN: 11.4 g/dL — AB (ref 13.0–17.0)
MCH: 33 pg (ref 26.0–34.0)
MCHC: 33 g/dL (ref 30.0–36.0)
MCV: 100 fL (ref 78.0–100.0)
Platelets: 294 10*3/uL (ref 150–400)
RBC: 3.45 MIL/uL — AB (ref 4.22–5.81)
RDW: 12.9 % (ref 11.5–15.5)
WBC: 9.3 10*3/uL (ref 4.0–10.5)

## 2016-01-01 LAB — HEPARIN LEVEL (UNFRACTIONATED): HEPARIN UNFRACTIONATED: 0.4 [IU]/mL (ref 0.30–0.70)

## 2016-01-01 MED ORDER — SODIUM POLYSTYRENE SULFONATE 15 GM/60ML PO SUSP
30.0000 g | Freq: Once | ORAL | Status: AC
  Administered 2016-01-01: 30 g via ORAL
  Filled 2016-01-01: qty 120

## 2016-01-01 MED ORDER — ENOXAPARIN SODIUM 80 MG/0.8ML ~~LOC~~ SOLN
1.0000 mg/kg | Freq: Once | SUBCUTANEOUS | Status: AC
  Administered 2016-01-01: 70 mg via SUBCUTANEOUS
  Filled 2016-01-01: qty 0.8

## 2016-01-01 MED ORDER — ENOXAPARIN SODIUM 80 MG/0.8ML ~~LOC~~ SOLN
1.0000 mg/kg | Freq: Two times a day (BID) | SUBCUTANEOUS | Status: DC
  Administered 2016-01-02 – 2016-01-06 (×9): 70 mg via SUBCUTANEOUS
  Filled 2016-01-01 (×10): qty 0.8

## 2016-01-01 NOTE — Progress Notes (Signed)
Preliminary Pre op cardiac surgery.  Carotid Duplex from 12/30/15: Bilateral 1-39% ICA stenosis. Dampened waveforms noted throughout carotid system, possibly cardiac related. Left ECA appears to be stenotic, based on 2:1 ratio.    RIGHT   LEFT    PRESSURE WAVEFORM  PRESSURE WAVEFORM                BRACHIAL 113 Tri BRACHIAL 112 Tri  RADIAL  Tri RADIAL  Tri  ULNAR  Tri ULNAR  Tri    RIGHT LEFT  PALMAR ARCH Obliterates with radial compression, decreases >50% with ulnar compression Obliterates with radial compression, normal with ulnar compression.     Craig [name], RDMS, RVT 01/01/2016

## 2016-01-01 NOTE — Progress Notes (Signed)
CARDIAC REHAB PHASE I   PRE:  Rate/Rhythm: 93 SR  BP:  Supine:   Sitting: 116/99  Standing:    SaO2: 92% 2.5L  MODE:  Ambulation: 400 ft   POST:  Rate/Rhythm: 107   BP:  Supine:   Sitting: 128/97  Standing:    SaO2: 94%RA 1207-1234 Pt walked 400 ft on 3L with rolling walker and asst x 1. Tolerated well. No CP. To recliner with call bell.   Luetta Nutting, RN BSN  01/01/2016 12:29 PM

## 2016-01-01 NOTE — Progress Notes (Signed)
[facility] PROGRESS NOTE  Craig [name] [facility] WJX:[id number] DOB: January 29, 1951 DOA: 12/26/2015 PCP: Rose Fillers, PA-C  Length of stay: 6   Assessment/Plan: Principal Problem:   Acute respiratory failure with hypoxia (HCC) Active Problems:   CAP (community acquired pneumonia)   NSTEMI (non-ST elevated myocardial infarction) (HCC)   COPD (chronic obstructive pulmonary disease) (HCC)   Hypoxia   Elevated troponin   History of hypertension   History of hyperlipidemia   Ascending aortic aneurysm (HCC)   Aortic stenosis   Acute respiratory failure (HCC)   CAD (coronary artery disease)   Brief summary  65 year old gentleman with a long heavy smoking history until 2010, COPD, hypertension and hyperlipidemia who reports being in his usual state of health with chronic exertional dyspnea and occasional episodes of dull chest aching until Monday a week ago. He then started having fever, chills, cough productive of white sputum and dyspnea. This continued to progress  .He began having chest discomfort and on Friday the severity of his symptoms prompted a visit to the ER. His CTA in the ER showed no PE. It did show significant emphysematous changes and consolidation in the right lung base as well as an area of interstitial thickening in the right apical region concerning for pneumonia. There was also diffuse dilation of the ascending aorta with involvement of the aortic arch with a maximum diameter of 5.2 cm.  . His initial troponin was 0.91, then 2.32. EKG demonstrated variable sinus tachycardia   transient inferior ST-T abnormalities, & supraventricular as well as ventricular ectopy. He was hypoxic in the ER with sats of 91% and was felt to have pneumonia. He was admitted. The following day he was seen by CCM for acute respiratory failure with hypercarbia and hypoxemia and was given a trial of bipap which he did not tolerate and was intubated and transferred to the MICU. A couple hours  later he extubated himself and has remained stable. An echo   showed a heavily calcified aortic valve with a mean gradient of 23 mm Hg and a peak of 60 mm Hg., moderate LV dysfunction with an EF of 35-40% with inferolateral and inferoseptal akinesis. He was loaded with Plavix. Cardiac cath today showed 50% ostial LM stenosis and 3-vessel CAD with moderate pulmonary hypertension . Patient being [facility] on 2/23.  Assessment and plan Acute hypoxemic respiratory failure secondary to right lower lobe pneumonia, flu negative, patient status post receiving empiric Tamiflu 2/18-2/21. intubated 2/19 due to worsening respiratory failure, extubated 2/20, abnormal troponin in the setting of demand ischemia, patient also found to have hypercarbia due to COPD exacerbation which has resolved, continue steroid taper, bronchodilators, patient will need pulmonary function testing for clearance prior to surgery  Acute pulmonary edema-resolved, may have been related to severe AS and tachycardia, CABG/ AVR planned, Echo 12/28/15 with LVEF of 35-40%, wall motion abnormalities and likely severe AS,Right and left heart cath done 12/29/15 revealed left main disease with 3 V CAD. Dr. Laneta Simmers. Plans for CABG/AVR/aortic root replacement when pulmonary issues are optimized.   Plavix has been stopped. Patient needs to be off Plavix for 7 days. Will start ASA and continue beta blocker. Could  IV heparin be switched to Lovenox ?  Right lower lobe CAP- neg respiratory culture and respiratory viral panel neg Complete ceftriaxone x 7ds and azithromycin, started 2/18  Acute COPD exacerbation-Duoneb Q6 with Q3 PRN albuterol , continue solumedrol to 40mg  IV Q12 > 50mg  BID ,  Continue Abx for pneumonia  until 2/25 Needs PFT's and improvement in pulmonary function prior to anticipated cardiac surgery.    DVT prophylaxsis heparin drip> Lovenox  Code Status:      Code Status Orders        Start     Ordered   12/27/15 0058   Full code   Continuous     12/27/15 0057    Code Status History    Date Active Date Inactive Code Status Order ID Comments User Context   This patient has a current code status but no historical code status.      Family Communication: Discussed in detail with the patient, all imaging results, lab results explained to the patient   Disposition Plan: Continued telemetry, anticipate surgery Thursday     Consultants:  Cardiology  Cardiothoracic surgery  Critical care    Procedures:     Antibiotics: Anti-infectives    Start     Dose/Rate Route Frequency Ordered Stop   12/27/15 2200  cefTRIAXone (ROCEPHIN) 1 g in dextrose 5 % 50 mL IVPB     1 g 100 mL/hr over 30 Minutes Intravenous Every 24 hours 12/26/15 2140 01/01/16 2359   12/27/15 2200  azithromycin (ZITHROMAX) 500 mg in dextrose 5 % 250 mL IVPB  Status:  Discontinued     500 mg 250 mL/hr over 60 Minutes Intravenous Every 24 hours 12/26/15 2141 12/30/15 1142   12/26/15 2115  cefTRIAXone (ROCEPHIN) 1 g in dextrose 5 % 50 mL IVPB     1 g 100 mL/hr over 30 Minutes Intravenous  Once 12/26/15 2100 12/26/15 2245   12/26/15 2115  azithromycin (ZITHROMAX) 500 mg in dextrose 5 % 250 mL IVPB     500 mg 250 mL/hr over 60 Minutes Intravenous  Once 12/26/15 2100 12/27/15 0028   12/26/15 2115  oseltamivir (TAMIFLU) capsule 75 mg     75 mg Oral  Once 12/26/15 2100 12/26/15 2155         HPI/Subjective: Sitting comfortably in the bed, no complaints, denies chest pain and shortness of breath  Objective: Filed Vitals:   12/31/15 2348 01/01/16 0113 01/01/16 0530 01/01/16 0940  BP: 135/105 90/69 100/82 98/68  Pulse: 86 87 87 75  Temp:   97.6 F (36.4 C)   TempSrc:   Oral   Resp:   16   Height:      Weight:      SpO2:   97%     Intake/Output Summary (Last 24 hours) at 01/01/16 0956 Last data filed at 12/31/15 1700  Gross per 24 hour  Intake 568.33 ml  Output      0 ml  Net 568.33 ml    Exam:  General: Well  developed, well nourished, in no acute distress. Alert and oriented x 3.  Psych: Good affect, responds appropriately Neck: No JVD. No masses noted.  Lungs: Clear bilaterally with no wheezes or rhonci noted.  Heart: RRR systolic murmur noted.  Abdomen: Bowel sounds are present. Soft, non-tender.  Extremities: No lower extremity edema.   Data Review   Micro Results Recent Results (from the past 240 hour(s))  MRSA PCR Screening     Status: None   Collection Time: 12/27/15 12:52 AM  Result Value Ref Range Status   MRSA by PCR NEGATIVE NEGATIVE Final    Comment:        The GeneXpert MRSA Assay (FDA approved for NASAL specimens only), is one component of a comprehensive MRSA colonization surveillance program.  It is not intended to diagnose MRSA infection nor to guide or monitor treatment for MRSA infections.   Culture, respiratory (NON-Expectorated)     Status: None   Collection Time: 12/27/15  2:12 PM  Result Value Ref Range Status   Specimen Description TRACHEAL ASPIRATE  Final   Special Requests NONE  Final   Gram Stain   Final    FEW WBC PRESENT,BOTH PMN AND MONONUCLEAR NO SQUAMOUS EPITHELIAL CELLS SEEN NO ORGANISMS SEEN Performed at [facility]    Culture   Final    NORMAL OROPHARYNGEAL FLORA Performed at [facility]    Report Status 12/29/2015 FINAL  Final  Respiratory virus panel     Status: None   Collection Time: 12/27/15  5:27 PM  Result Value Ref Range Status   Source - RVPAN NASAL SWAB  Corrected   Respiratory Syncytial Virus A Negative Negative Final   Respiratory Syncytial Virus B Negative Negative Final   Influenza A Negative Negative Final   Influenza B Negative Negative Final   Parainfluenza 1 Negative Negative Final   Parainfluenza 2 Negative Negative Final   Parainfluenza 3 Negative Negative Final   Metapneumovirus Negative Negative Final   Rhinovirus Negative Negative Final   Adenovirus Negative Negative Final    Comment:  (NOTE) Performed At: [facility] [address] [place], [place] [id number] Mila Homer MD NW:[id number]     Radiology Reports Dg Chest 2 View  12/26/2015  CLINICAL DATA:  Shortness of breath yesterday, progressive today. Occasional cough. EXAM: CHEST  2 VIEW COMPARISON:  Most recent chest radiograph 05/27/2009 FINDINGS: Advanced emphysematous change with hyperinflation. Diffuse increased interstitial markings may reflect superimposed pulmonary edema. Blunting of the costophrenic angles, right greater than left, pleural effusions versus hyperinflation. Patchy airspace disease in the posterior right upper lobe. Heart is normal in size, mediastinal contours are normal. No pneumothorax. Remote bilateral rib fractures. IMPRESSION: 1. Patchy right upper lobe opacity, concerning for pneumonia. Followup PA and lateral chest X-ray is recommended in 3-4 weeks following trial of antibiotic therapy to ensure resolution and exclude underlying malignancy. 2. Advanced emphysema. Increased interstitial markings from prior exam may reflect superimposed edema. Small pleural effusions versus hyperinflation. Question degree of volume overload/CHF, with concurrent pneumonia, all superimposed on chronic lung disease. Electronically Signed   By: Rubye Oaks M.D.   On: 12/26/2015 18:11   Ct Angio Chest Pe W/cm &/or Wo Cm  12/26/2015  CLINICAL DATA:  65 year old male with shortness of breath copy, tachycardia. EXAM: CT ANGIOGRAPHY CHEST WITH CONTRAST TECHNIQUE: Multidetector CT imaging of the chest was performed using the standard protocol during bolus administration of intravenous contrast. Multiplanar CT image reconstructions and MIPs were obtained to evaluate the vascular anatomy. CONTRAST:  80mL OMNIPAQUE IOHEXOL 350 MG/ML SOLN COMPARISON:  Radiograph dated 12/26/2015 FINDINGS: There is emphysematous changes of the lungs. Patchy area of consolidative changes at the right lung base as well as area of  interstitial thickening in the right apical region are concerning for pneumonia. There is a small right pleural effusion. No pneumothorax. The central airways are patent. There is mild bronchiectatic changes with mild thickening of the bronchial wall. There is diffuse dilatation of the ascending aorta with involvement of the proximal arch measuring up to 5.2 cm in diameter. Evaluation of the aorta is limited as timing of the contrast was designed for optimal opacification of the pulmonary arteries. No definite dissection identified. There is no periaortic fluid. There is atherosclerotic calcification of thoracic aorta. No  CT evidence of pulmonary embolism. There is no cardiomegaly or significant pericardial effusion. There is coronary vascular calcification. There is calcification of the aortic valves. There is thickened appearance of the left ventricular myocardium, likely myocardial hypertrophy. Mild dilatation of the left atrium. Correlation with echocardiogram recommended. There is retrograde flow of contrast from the right atrium into the IVC compatible with a degree of right cardiac dysfunction. Right hilar and subcarinal adenopathy. The esophagus and the thyroid gland are grossly unremarkable. There is no axillary adenopathy. The chest wall soft tissues appear unremarkable. Old healed bilateral rib fractures. No acute fracture. The visualized upper abdomen appear grossly unremarkable. Review of the MIP images confirms the above findings. IMPRESSION: No CT evidence of pulmonary embolism. Emphysema with an area of consolidative change in the right lung base compatible with pneumonia. Small right pleural effusion. Diffuse dilatation of the ascending aorta and proximal arch measuring up to 5.2 cm. Evaluation of the aorta is limited on this study as timing of the contrast was designed to opacifying the pulmonary vasculature. No definite dissection identified. CT angiography with optimal opacification of the aorta  may provide additional evaluation. Ascending thoracic aortic aneurysm. Recommend semi-annual imaging followup by CTA or MRA and referral to cardiothoracic surgery if not already obtained. This recommendation follows 2010 ACCF/AHA/AATS/ACR/ASA/SCA/SCAI/SIR/STS/SVM Guidelines for the Diagnosis and Management of Patients With Thoracic Aortic Disease. Circulation. 2010; 121: Z610-R604 Calcification of the aortic valves with probable resulting aortic valve stenosis. Dilatation of the ascending aorta may represent post stenotic aneurysm. There is also left ventricular hypertrophy and mild dilatation of the left atrium. Echocardiogram is recommended for further evaluation. Electronically Signed   By: Elgie Collard M.D.   On: 12/26/2015 21:19   Dg Chest Port 1 View  12/28/2015  CLINICAL DATA:  Acute respiratory failure. The patient extubated himself yesterday. EXAM: PORTABLE CHEST 1 VIEW COMPARISON:  Yesterday. FINDINGS: The cardiac silhouette remains borderline enlarged. Decreased bilateral airspace opacity. Stable bilateral pleural effusions and prominence of the pulmonary vasculature and interstitial markings. No acute bony abnormality. IMPRESSION: 1. Improving congestive heart failure superimposed on COPD. 2. Persistent probable atelectasis at the right lung base and mild left basilar atelectasis. Electronically Signed   By: Beckie Salts M.D.   On: 12/28/2015 07:14   Dg Chest Port 1 View  12/27/2015  CLINICAL DATA:  Intubated. EXAM: PORTABLE CHEST 1 VIEW COMPARISON:  Yesterday. FINDINGS: Borderline enlarged cardiac silhouette with a mild increase in size. Increased airspace opacity throughout the right lung and the majority of the left lung. Increased size of small bilateral pleural effusions. Endotracheal tube in satisfactory position. Nasogastric tube tip in the mid stomach. Diffuse osteopenia. IMPRESSION: Worsening changes of congestive heart failure superimposed on COPD. Underlying pneumonia cannot be  excluded. Electronically Signed   By: Beckie Salts M.D.   On: 12/27/2015 13:42   Dg Abd Portable 1v  12/27/2015  CLINICAL DATA:  Encounter for orogastric tube placement. EXAM: PORTABLE ABDOMEN - 1 VIEW COMPARISON:  None. FINDINGS: Oral/nasogastric tube passes below the diaphragm extending well into the stomach. Tip lies in the mid to distal stomach. Normal bowel gas pattern. IMPRESSION: Oral/nasogastric tube is well positioned extending well into the stomach. Electronically Signed   By: Amie Portland M.D.   On: 12/27/2015 13:44     CBC  Recent Labs Lab 12/29/15 0520 12/29/15 1649 12/30/15 0340 12/31/15 0142 01/01/16 0330  WBC 12.6* 14.7* 11.9* 10.6* 9.3  HGB 11.6* 13.4 12.6* 12.4* 11.4*  HCT 34.4* 40.3 38.3* 37.6* 34.5*  PLT 251 336 293 306 294  MCV 98.0 97.3 97.5 97.7 100.0  MCH 33.0 32.4 32.1 32.2 33.0  MCHC 33.7 33.3 32.9 33.0 33.0  RDW 13.1 13.1 13.2 13.2 12.9    Chemistries   Recent Labs Lab 12/28/15 1532 12/29/15 0259 12/29/15 0520 12/29/15 1649 12/30/15 0340 12/31/15 0142 01/01/16 0330  NA  --  138 139  --  139 139 136  K  --  4.6 4.6  --  5.0 5.2* 5.6*  CL  --  103 105  --  97* 99* 99*  CO2  --  25 25  --  GLUCOSE  --  150* 150*  --  148* 138* 136*  BUN  --  27* 25*  --  31* 37* 33*  CREATININE  --  1.10 1.02 1.03 1.28* 1.12 0.96  CALCIUM  --  9.0 9.0  --  9.3 9.1 8.9  MG 2.2  --   --   --   --   --   --   AST  --   --   --   --   --   --  36  ALT  --   --   --   --   --   --  31  ALKPHOS  --   --   --   --   --   --  50  BILITOT  --   --   --   --   --   --  0.2*   ------------------------------------------------------------------------------------------------------------------ estimated creatinine clearance is 75.5 mL/min (by C-G formula based on Cr of 0.96). ------------------------------------------------------------------------------------------------------------------ No results for input(s): HGBA1C in the last 72  hours. ------------------------------------------------------------------------------------------------------------------ No results for input(s): CHOL, HDL, LDLCALC, TRIG, CHOLHDL, LDLDIRECT in the last 72 hours. ------------------------------------------------------------------------------------------------------------------ No results for input(s): TSH, T4TOTAL, T3FREE, THYROIDAB in the last 72 hours.  Invalid input(s): FREET3 ------------------------------------------------------------------------------------------------------------------ No results for input(s): VITAMINB12, FOLATE, FERRITIN, TIBC, IRON, RETICCTPCT in the last 72 hours.  Coagulation profile  Recent Labs Lab 12/26/15 2156 12/29/15 0520  INR 1.16 1.15    No results for input(s): DDIMER in the last 72 hours.  Cardiac Enzymes  Recent Labs Lab 12/27/15 1703 12/27/15 2015 12/28/15 0238  TROPONINI 1.22* 0.92* 0.83*   ------------------------------------------------------------------------------------------------------------------ Invalid input(s): POCBNP   CBG:  Recent Labs Lab 12/27/15 0831 12/27/15 1142 12/27/15 1748  GLUCAP 109* 221* 145*       Studies: No results found.    Lab Results  Component Value Date   HGBA1C  05/28/2009    5.7 (NOTE) The ADA recommends the following therapeutic goal for glycemic control related to Hgb A1c measurement: Goal of therapy: <6.5 Hgb A1c  Reference: American Diabetes Association: Clinical Practice Recommendations 2010, Diabetes Care, 2010, 33: (Suppl  1).   Lab Results  Component Value Date   LDLCALC 117* 12/27/2015   CREATININE 0.96 01/01/2016       Scheduled Meds: . antiseptic oral rinse  7 mL Mouth Rinse BID  . aspirin  81 mg Oral Daily  . atorvastatin  80 mg Oral q1800  . cefTRIAXone (ROCEPHIN)  IV  1 g Intravenous Q24H  . methylPREDNISolone (SOLU-MEDROL) injection  40 mg Intravenous Q12H  . metoprolol tartrate  25 mg Oral BID  . sodium  chloride flush  3 mL Intravenous Q12H   Continuous Infusions: . heparin 1,250 Units/hr (01/01/16 0111)    Principal Problem:   Acute respiratory failure with hypoxia (HCC) Active Problems:   CAP (  community acquired pneumonia)   NSTEMI (non-ST elevated myocardial infarction) (HCC)   COPD (chronic obstructive pulmonary disease) (HCC)   Hypoxia   Elevated troponin   History of hypertension   History of hyperlipidemia   Ascending aortic aneurysm (HCC)   Aortic stenosis   Acute respiratory failure (HCC)   CAD (coronary artery disease)    Time spent: 45 minutes   [facility]  [facility] Pager [phone]. If 7PM-7AM, please contact night-coverage at [email or web address], password [facility] 01/01/2016, 9:56 AM  LOS: 6 days

## 2016-01-01 NOTE — Progress Notes (Signed)
3 Days Post-Op Procedure(s) (LRB): Right/Left Heart Cath and Coronary Angiography (N/A) Subjective:  Continues to feel better daily. Walked today without shortness of breath. Minimal sputum  Objective: Vital signs in last 24 hours: Temp:  [97.6 F (36.4 C)-98.5 F (36.9 C)] 98.1 F (36.7 C) (02/24 1449) Pulse Rate:  [75-108] 96 (02/24 1449) Cardiac Rhythm:  [-] Normal sinus rhythm (02/24 0700) Resp:  [16-18] 18 (02/24 1449) BP: (90-135)/(68-105) 112/92 mmHg (02/24 1449) SpO2:  [97 %-98 %] 97 % (02/24 1449)  Hemodynamic parameters for last 24 hours:    Intake/Output from previous day: 02/23 0701 - 02/24 0700 In: 970.3 [P.O.:760; I.V.:210.3] Out: 125 [Urine:125] Intake/Output this shift: Total I/O In: 480 [P.O.:480] Out: -   General appearance: alert and cooperative Heart: regular rate and rhythm and soft systolic murmur Lungs: clear to auscultation bilaterally  Lab Results:  Recent Labs  12/31/15 0142 01/01/16 0330  WBC 10.6* 9.3  HGB 12.4* 11.4*  HCT 37.6* 34.5*  PLT 306 294   BMET:  Recent Labs  12/31/15 0142 01/01/16 0330  NA 139 136  K 5.2* 5.6*  CL 99* 99*  CO2 30 29  GLUCOSE 138* 136*  BUN 37* 33*  CREATININE 1.12 0.96  CALCIUM 9.1 8.9    PT/INR: No results for input(s): LABPROT, INR in the last 72 hours. ABG    Component Value Date/Time   PHART 7.409 12/29/2015 1407   HCO3 25.1* 12/29/2015 1415   TCO2 26 12/29/2015 1415   ACIDBASEDEF 1.0 12/29/2015 1415   O2SAT 57.0 12/29/2015 1415   CBG (last 3)  No results for input(s): GLUCAP in the last 72 hours.  Assessment/Plan:  Severe multi-vessel coronary disease with moderate LV dysfunction and LVEF of 35-40%.  Probably severe AS: His gradient is in the moderate range but his valve looks very tight on echo. He does not have much of a murmur which is unusual but I guess it could be because he has severe AS and reduced EF.  Aortic root, ascending and arch aneurysm.  Right lung pneumonia and  respiratory failure which could have been precipitated by heart failure in this gentleman with a long prior smoking history and COPD with chronic exertional dyspnea.   Plan surgery next Thursday.   LOS: 6 days    Alleen Borne 01/01/2016

## 2016-01-01 NOTE — Progress Notes (Signed)
     SUBJECTIVE: No chest pain or SOB.   Tele: sinus  BP 98/68 mmHg  Pulse 75  Temp(Src) 97.6 F (36.4 C) (Oral)  Resp 16  Ht  (1.753 m)  Wt 149 lb 7.6 oz (67.8 kg)  BMI 22.06 kg/m2  SpO2 97%  Intake/Output Summary (Last 24 hours) at 01/01/16 1010 Last data filed at 12/31/15 1700  Gross per 24 hour  Intake 547.33 ml  Output      0 ml  Net 547.33 ml    PHYSICAL EXAM General: Well developed, well nourished, in no acute distress. Alert and oriented x 3.  Psych:  Good affect, responds appropriately Neck: No JVD. No masses noted.  Lungs: Clear bilaterally with no wheezes or rhonci noted.  Heart: RRR with no murmurs noted. Abdomen: Bowel sounds are present. Soft, non-tender.  Extremities: No lower extremity edema.   LABS: Basic Metabolic Panel:  Recent Labs  16/10/96 0142 01/01/16 0330  NA 139 136  K 5.2* 5.6*  CL 99* 99*  CO2 30 29  GLUCOSE 138* 136*  BUN 37* 33*  CREATININE 1.12 0.96  CALCIUM 9.1 8.9   CBC:  Recent Labs  12/31/15 0142 01/01/16 0330  WBC 10.6* 9.3  HGB 12.4* 11.4*  HCT 37.6* 34.5*  MCV 97.7 100.0  PLT 306 294   Current Meds: . antiseptic oral rinse  7 mL Mouth Rinse BID  . aspirin  81 mg Oral Daily  . atorvastatin  80 mg Oral q1800  . cefTRIAXone (ROCEPHIN)  IV  1 g Intravenous Q24H  . methylPREDNISolone (SOLU-MEDROL) injection  40 mg Intravenous Q12H  . metoprolol tartrate  25 mg Oral BID  . sodium chloride flush  3 mL Intravenous Q12H  . sodium polystyrene  30 g Oral Once     ASSESSMENT AND PLAN:  1. NSTEMI/Respiratory failure: Pt admitted with chest pain, SOB and elevated troponin. Echo 12/28/15 with LVEF of 35-40%, wall motion abnormalities and likely severe AS. 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 Plan is for CABG/AVR when stable from pulmonary standpoint. Will continue ASA, statin, beta blocker. He received Plavix initially, currently on hold awaiting washout for surgery.  He  is on IV heparin but ok with me to change to Lovenox.   2. Cardiomyopathy: Ischemic. Continue beta blocker. Start Ace-inh but would wait given elevated potassium.   3. Severe AS: new finding on echo 2/20. Will need AVR at time of CABG. Surgery is tentatively scheduled for next Thursday.   4. Dilated aortic root: Will need aortic root replacement at time of CABG/AVR    MCALHANY,CHRISTOPHER  2/24/201710:10 AM

## 2016-01-01 NOTE — Progress Notes (Signed)
ANTICOAGULATION CONSULT NOTE - Follow Up Consult  Pharmacy Consult for heparin Indication: CAD awaiting CABG  Labs:  Recent Labs  12/29/15 0520 12/29/15 1649 12/30/15 0340  12/31/15 0142 12/31/15 0907 12/31/15 1556 12/31/15 2248  HGB 11.6* 13.4 12.6*  --  12.4*  --   --   --   HCT 34.4* 40.3 38.3*  --  37.6*  --   --   --   PLT 251 336 293  --  306  --   --   --   LABPROT 14.9  --   --   --   --   --   --   --   INR 1.15  --   --   --   --   --   --   --   HEPARINUNFRC  --   --   --   < > 0.29* <0.10* 0.24* 0.58  CREATININE 1.02 1.03 1.28*  --  1.12  --   --   --   < > = values in this interval not displayed.    Assessment/Plan:  65yo male therapeutic on heparin after rate increase. Will continue gtt at current rate and confirm stable with am labs.   Vernard Gambles, PharmD, BCPS  01/01/2016,12:02 AM

## 2016-01-01 NOTE — Progress Notes (Signed)
ANTICOAGULATION CONSULT NOTE - Follow Up Consult  Pharmacy Consult for heparin drip to Lovenox Indication: 3V CAD awaiting CABG/AVR  No Known Allergies  Patient Measurements: Height:  (175.3 cm) Weight: 149 lb 7.6 oz (67.8 kg) IBW/kg (Calculated) : 70.7  Vital Signs: Temp: 97.6 F (36.4 C) (02/24 0530) Temp Source: Oral (02/24 0530) BP: 98/68 mmHg (02/24 0940) Pulse Rate: 75 (02/24 0940)  Labs:  Recent Labs  12/30/15 0340  12/31/15 0142  12/31/15 1556 12/31/15 2248 01/01/16 0330  HGB 12.6*  --  12.4*  --   --   --  11.4*  HCT 38.3*  --  37.6*  --   --   --  34.5*  PLT 293  --  306  --   --   --  294  HEPARINUNFRC  --   < > 0.29*  < > 0.24* 0.58 0.40  CREATININE 1.28*  --  1.12  --   --   --  0.96  < > = values in this interval not displayed.  Estimated Creatinine Clearance: 75.5 mL/min (by C-G formula based on Cr of 0.96).   Assessment: 65 y/o male on IV heparin drip for CAD awaiting Plavix washout for CABG and AVR. Last heparin level is therapeutic from this morning. Pharmacy consulted to transition to Lovenox. No bleeding noted, CBC is stable.  Goal of Therapy:  Anti-Xa level 0.6-1 units/ml 4hrs after LMWH dose given Monitor platelets by anticoagulation protocol: Yes   Plan:  - Discontinue heparin drip - Lovenox 70 mg SQ q12h - begin 1 hr after heparin drip off - Lovenox doses staggered for patient friendly administration - CBC q72h while on Lovenox  [facility], [address].D., BCPS Clinical Pharmacist Pager: [phone] 01/01/2016 1:27 PM

## 2016-01-01 NOTE — Progress Notes (Signed)
Heparin has been stopped. Lovenox will be given at next scheduled time. Will continue to monitor.  Berdine Dance RN, BSN

## 2016-01-02 LAB — BASIC METABOLIC PANEL
ANION GAP: 11 (ref 5–15)
BUN: 27 mg/dL — ABNORMAL HIGH (ref 6–20)
CO2: 27 mmol/L (ref 22–32)
CREATININE: 0.88 mg/dL (ref 0.61–1.24)
Calcium: 9 mg/dL (ref 8.9–10.3)
Chloride: 100 mmol/L — ABNORMAL LOW (ref 101–111)
GFR calc Af Amer: >60 mL/min (ref >60)
GFR calc non Af Amer: >60 mL/min (ref >60)
Glucose, Bld: 114 mg/dL — ABNORMAL HIGH (ref 65–99)
POTASSIUM: 4.5 mmol/L (ref 3.5–5.1)
Sodium: 138 mmol/L (ref 135–145)

## 2016-01-02 MED ORDER — MOMETASONE FURO-FORMOTEROL FUM 100-5 MCG/ACT IN AERO
2.0000 | INHALATION_SPRAY | Freq: Two times a day (BID) | RESPIRATORY_TRACT | Status: DC
  Administered 2016-01-02 – 2016-01-13 (×13): 2 via RESPIRATORY_TRACT
  Filled 2016-01-02 (×2): qty 8.8

## 2016-01-02 MED ORDER — METHYLPREDNISOLONE SODIUM SUCC 40 MG IJ SOLR
40.0000 mg | INTRAMUSCULAR | Status: DC
  Administered 2016-01-03 – 2016-01-04 (×2): 40 mg via INTRAVENOUS
  Filled 2016-01-02 (×2): qty 1

## 2016-01-02 MED ORDER — TIOTROPIUM BROMIDE MONOHYDRATE 18 MCG IN CAPS
18.0000 ug | ORAL_CAPSULE | Freq: Every day | RESPIRATORY_TRACT | Status: DC
  Administered 2016-01-03 – 2016-02-03 (×22): 18 ug via RESPIRATORY_TRACT
  Filled 2016-01-02 (×6): qty 5

## 2016-01-02 NOTE — Progress Notes (Signed)
[facility] Progress Note  Patient: [facility] [name] [facility] ZOX:[id number]   PCP: Veda Canning D, PA-C DOB: 1951-10-08   DOA: 12/26/2015   DOS: 01/02/2016   Date of Service: the patient was seen and examined on 01/02/2016  Subjective: Patient does not have any acute complaint. Breathing is improving. No cough no chest pain. No nausea no vomiting. Nutrition: Tolerating oral diet Activity: Ambulating in the room Last BM: 01/01/2016  Assessment and Plan: Acute hypoxemic respiratory failure secondary to right lower lobe pneumonia, Acute COPD exacerbation flu negative, patient status post receiving empiric Tamiflu 2/18-2/21.  CT PE negative, intubated 2/19 due to worsening respiratory failure, extubated 2/20, abnormal troponin in the setting of demand ischemia, patient also found to have hypercarbia due to COPD exacerbation which has resolved, continue steroid taper, bronchodilators, patient will need pulmonary function testing for clearance prior to surgery I would place him on an inhaler for optimization of his pulmonary function  Acute pulmonary edema Coronary artery disease. Ischemic cardiomyopathy. Severe aortic stenosis. 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 Dr. Laneta Simmers. Plans for CABG/AVR/aortic root replacement when pulmonary issues are optimized. Plavix has been stopped. Patient needs to be off Plavix for 7 days. on ASA and continue beta blocker.   Right lower lobe CAP- neg respiratory culture and respiratory viral panel neg Complete ceftriaxone x 7ds and azithromycin, started 2/18  Occasional SVT. Cardiology was consulted. Currently on Lopressor 25 twice a day. We will check EKG.  DVT Prophylaxis: subcutaneous Heparin Nutrition: Cardiac diet  Advance goals of care  discussion: full code  Brief Summary of Hospitalization:  Procedures: Echocardiogram, left and right heart cath Consultants: cardiology, cardiothoracic surgery  Antibiotics: Anti-infectives    Start     Dose/Rate Route Frequency Ordered Stop   12/27/15 2200  cefTRIAXone (ROCEPHIN) 1 g in dextrose 5 % 50 mL IVPB     1 g 100 mL/hr over 30 Minutes Intravenous Every 24 hours 12/26/15 2140 01/01/16 2242   12/27/15 2200  azithromycin (ZITHROMAX) 500 mg in dextrose 5 % 250 mL IVPB  Status:  Discontinued     500 mg 250 mL/hr over 60 Minutes Intravenous Every 24 hours 12/26/15 2141 12/30/15 1142   12/26/15 2115  cefTRIAXone (ROCEPHIN) 1 g in dextrose 5 % 50 mL IVPB     1 g 100 mL/hr over 30 Minutes Intravenous  Once 12/26/15 2100 12/26/15 2245   12/26/15 2115  azithromycin (ZITHROMAX) 500 mg in dextrose 5 % 250 mL IVPB     500 mg 250 mL/hr over 60 Minutes Intravenous  Once 12/26/15 2100 12/27/15 0028   12/26/15 2115  oseltamivir (TAMIFLU) capsule 75 mg     75 mg Oral  Once 12/26/15 2100 12/26/15 2155       Family Communication: family was present at bedside, at the time of interview.  Opportunity was given to ask question and all questions were answered satisfactorily.   Disposition:  Barriers to safe discharge: Improvement in cardiac and respiratory function    Intake/Output Summary (Last 24 hours) at 01/02/16 1417 Last data filed at 01/02/16 1324  Gross per 24 hour  Intake    243 ml  Output      0 ml  Net    243 ml   Filed Weights   12/27/15 0058 12/29/15 0706  Weight:  65.7 kg (144 lb 13.5 oz) 67.8 kg (149 lb 7.6 oz)    Objective: Physical Exam: Filed Vitals:   01/01/16 0940 01/01/16 1449 01/01/16 2143 01/02/16 0453  BP: 98/68 112/92 123/84 100/77  Pulse: 75 96 100 77  Temp:  98.1 F (36.7 C) 98.3 F (36.8 C) 97.7 F (36.5 C)  TempSrc:  Oral Oral Oral  Resp:  Height:      Weight:      SpO2:  97% 98% 97%     General: Appear in mild distress, no Rash;  Oral Mucosa moist. Cardiovascular: S1 and S2 Present, no Murmur, no JVD Respiratory: Bilateral Air entry present and basal Crackles, bilateral wheezes Abdomen: Bowel Sound present, Soft and no tenderness Extremities: no Pedal edema, no calf tenderness Neurology: Grossly no focal neuro deficit.  Data Reviewed: CBC:  Recent Labs Lab 12/29/15 0520 12/29/15 1649 12/30/15 0340 12/31/15 0142 01/01/16 0330  WBC 12.6* 14.7* 11.9* 10.6* 9.3  HGB 11.6* 13.4 12.6* 12.4* 11.4*  HCT 34.4* 40.3 38.3* 37.6* 34.5*  MCV 98.0 97.3 97.5 97.7 100.0  PLT 251 336 293 306 294   Basic Metabolic Panel:  Recent Labs Lab 12/28/15 1532  12/29/15 0520 12/29/15 1649 12/30/15 0340 12/31/15 0142 01/01/16 0330 01/02/16 1242  NA  --   < > 139  --  139 139 136 138  K  --   < > 4.6  --  5.0 5.2* 5.6* 4.5  CL  --   < > 105  --  97* 99* 99* 100*  CO2  --   < > 25  --  GLUCOSE  --   < > 150*  --  148* 138* 136* 114*  BUN  --   < > 25*  --  31* 37* 33* 27*  CREATININE  --   < > 1.02 1.03 1.28* 1.12 0.96 0.88  CALCIUM  --   < > 9.0  --  9.3 9.1 8.9 9.0  MG 2.2  --   --   --   --   --   --   --   PHOS 2.9  --   --   --   --   --   --   --   < > = values in this interval not displayed. Liver Function Tests:  Recent Labs Lab 01/01/16 0330  AST 36  ALT 31  ALKPHOS 50  BILITOT 0.2*  PROT 5.8*  ALBUMIN 2.9*   No results for input(s): LIPASE, AMYLASE in the last 168 hours. No results for input(s): AMMONIA in the last 168 hours.  Cardiac Enzymes:  Recent Labs Lab 12/27/15 0632 12/27/15 1032 12/27/15 1703 12/27/15 2015 12/28/15 0238  TROPONINI 1.34* 1.17* 1.22* 0.92* 0.83*    BNP (last 3 results)  Recent Labs  12/27/15 1703  BNP 1306.4*    CBG:  Recent Labs Lab 12/27/15 0831 12/27/15 1142 12/27/15 1748  GLUCAP 109* 221* 145*    Recent Results (from the past 240 hour(s))  MRSA PCR Screening     Status: None   Collection Time: 12/27/15 12:52 AM  Result Value Ref  Range Status   MRSA by PCR NEGATIVE NEGATIVE Final    Comment:        The GeneXpert MRSA Assay (FDA approved for NASAL specimens only), is one component of a comprehensive MRSA colonization surveillance program. It is not intended to diagnose MRSA infection nor to guide or monitor treatment for  MRSA infections.   Culture, respiratory (NON-Expectorated)     Status: None   Collection Time: 12/27/15  2:12 PM  Result Value Ref Range Status   Specimen Description TRACHEAL ASPIRATE  Final   Special Requests NONE  Final   Gram Stain   Final    FEW WBC PRESENT,BOTH PMN AND MONONUCLEAR NO SQUAMOUS EPITHELIAL CELLS SEEN NO ORGANISMS SEEN Performed at [facility]    Culture   Final    NORMAL OROPHARYNGEAL FLORA Performed at [facility]    Report Status 12/29/2015 FINAL  Final  Respiratory virus panel     Status: None   Collection Time: 12/27/15  5:27 PM  Result Value Ref Range Status   Source - RVPAN NASAL SWAB  Corrected   Respiratory Syncytial Virus A Negative Negative Final   Respiratory Syncytial Virus B Negative Negative Final   Influenza A Negative Negative Final   Influenza B Negative Negative Final   Parainfluenza 1 Negative Negative Final   Parainfluenza 2 Negative Negative Final   Parainfluenza 3 Negative Negative Final   Metapneumovirus Negative Negative Final   Rhinovirus Negative Negative Final   Adenovirus Negative Negative Final    Comment: (NOTE) Performed At: [facility] [address] [place], [place] [id number] Mila Homer MD WU:[id number]      Studies: No results found.   Scheduled Meds: . antiseptic oral rinse  7 mL Mouth Rinse BID  . aspirin  81 mg Oral Daily  . atorvastatin  80 mg Oral q1800  . enoxaparin (LOVENOX) injection  1 mg/kg Subcutaneous Q12H  . [START ON 01/03/2016] methylPREDNISolone (SOLU-MEDROL) injection  40 mg Intravenous Q24H  . metoprolol tartrate  25 mg Oral BID  . mometasone-formoterol  2 puff  Inhalation BID  . sodium chloride flush  3 mL Intravenous Q12H  . tiotropium  18 mcg Inhalation Daily   Continuous Infusions:  PRN Meds: sodium chloride, acetaminophen **OR** acetaminophen, alum & mag hydroxide-simeth, ipratropium-albuterol, ondansetron **OR** ondansetron (ZOFRAN) IV, sodium chloride flush  Time spent: 30 minutes  Author: Lynden Oxford, MD [facility] Pager: [phone] 01/02/2016 2:17 PM  If 7PM-7AM, please contact night-coverage at [email or web address], password [facility]

## 2016-01-03 DIAGNOSIS — I110 Hypertensive heart disease with heart failure: Secondary | ICD-10-CM

## 2016-01-03 LAB — CBC
HEMATOCRIT: 38 % — AB (ref 39.0–52.0)
HEMOGLOBIN: 12.2 g/dL — AB (ref 13.0–17.0)
MCH: 31.5 pg (ref 26.0–34.0)
MCHC: 32.1 g/dL (ref 30.0–36.0)
MCV: 98.2 fL (ref 78.0–100.0)
Platelets: 315 10*3/uL (ref 150–400)
RBC: 3.87 MIL/uL — ABNORMAL LOW (ref 4.22–5.81)
RDW: 12.7 % (ref 11.5–15.5)
WBC: 9.6 10*3/uL (ref 4.0–10.5)

## 2016-01-03 LAB — BASIC METABOLIC PANEL
ANION GAP: 6 (ref 5–15)
BUN: 21 mg/dL — AB (ref 6–20)
CHLORIDE: 101 mmol/L (ref 101–111)
CO2: 31 mmol/L (ref 22–32)
Calcium: 8.6 mg/dL — ABNORMAL LOW (ref 8.9–10.3)
Creatinine, Ser: 1.04 mg/dL (ref 0.61–1.24)
GFR calc Af Amer: >60 mL/min (ref >60)
GLUCOSE: 105 mg/dL — AB (ref 65–99)
POTASSIUM: 4.4 mmol/L (ref 3.5–5.1)
SODIUM: 138 mmol/L (ref 135–145)

## 2016-01-03 LAB — PROTIME-INR
INR: 1.1 (ref 0.00–1.49)
PROTHROMBIN TIME: 14.4 s (ref 11.6–15.2)

## 2016-01-03 LAB — MAGNESIUM: Magnesium: 2.1 mg/dL (ref 1.7–2.4)

## 2016-01-03 MED ORDER — ASPIRIN EC 81 MG PO TBEC
81.0000 mg | DELAYED_RELEASE_TABLET | Freq: Every day | ORAL | Status: DC
  Administered 2016-01-03 – 2016-01-06 (×4): 81 mg via ORAL
  Filled 2016-01-03 (×4): qty 1

## 2016-01-03 MED ORDER — AMIODARONE HCL 200 MG PO TABS
400.0000 mg | ORAL_TABLET | Freq: Three times a day (TID) | ORAL | Status: DC
  Administered 2016-01-03 – 2016-01-07 (×12): 400 mg via ORAL
  Filled 2016-01-03 (×12): qty 2

## 2016-01-03 MED ORDER — AMIODARONE HCL 200 MG PO TABS: 200.0000 mg | ORAL_TABLET | Freq: Every day | ORAL | Status: DC

## 2016-01-03 NOTE — Progress Notes (Signed)
[facility] Progress Note  Patient: [name] ZOX:[id number]   PCP: Rose Fillers, PA-C DOB: 05/28/1951   DOA: 12/26/2015   DOS: 01/03/2016   Date of Service: the patient was seen and examined on 01/03/2016  Subjective: Patient mentions dulera to his improving his breathing, does not have any chest pain and abdominal pain nausea or vomiting.. Nutrition: Tolerating oral diet Activity: Ambulating in the room Last BM: 01/01/2016  Assessment and Plan: Acute hypoxemic respiratory failure secondary to right lower lobe pneumonia, Acute COPD exacerbation flu negative, patient status post receiving empiric Tamiflu 2/18-2/21.  CT PE negative, intubated 2/19 due to worsening respiratory failure, extubated 2/20, abnormal troponin in the setting of demand ischemia, patient also found to have hypercarbia due to COPD exacerbation which has resolved, continue steroid taper, bronchodilators, patient will need pulmonary function testing for clearance prior to surgery, critical care initially recommended to wait until his respiratory functions improved. We will discuss with them tomorrow in follow-up on the patient for clearance for the valvel replacement surgery. Continue inhalers  Acute pulmonary edema Coronary artery disease. Ischemic cardiomyopathy. Severe aortic stenosis. 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 Dr. Laneta Simmers. Plans for CABG/AVR/aortic root replacement when pulmonary issues are optimized. Plavix has been stopped. Patient needs to be off Plavix for 7 days. on ASA and continue beta blocker.   Right lower lobe CAP- neg respiratory culture and respiratory viral panel neg Complete ceftriaxone x 7ds and azithromycin, last dose 2/25//2017  SVT. Cardiology was  consulted. Currently on Lopressor 25 twice a day. Cardiology recommended to add amiodarone for the patient. Plan is to stop it as an outpatient later on  DVT Prophylaxis: subcutaneous Heparin, therapeutic dose Nutrition: Cardiac diet  Advance goals of care discussion: full code  Brief Summary of Hospitalization:  Procedures: Echocardiogram, left and right heart cath Consultants: cardiology, cardiothoracic surgery  Antibiotics: Anti-infectives    Start     Dose/Rate Route Frequency Ordered Stop   12/27/15 2200  cefTRIAXone (ROCEPHIN) 1 g in dextrose 5 % 50 mL IVPB     1 g 100 mL/hr over 30 Minutes Intravenous Every 24 hours 12/26/15 2140 01/01/16 2242   12/27/15 2200  azithromycin (ZITHROMAX) 500 mg in dextrose 5 % 250 mL IVPB  Status:  Discontinued     500 mg 250 mL/hr over 60 Minutes Intravenous Every 24 hours 12/26/15 2141 12/30/15 1142   12/26/15 2115  cefTRIAXone (ROCEPHIN) 1 g in dextrose 5 % 50 mL IVPB     1 g 100 mL/hr over 30 Minutes Intravenous  Once 12/26/15 2100 12/26/15 2245   12/26/15 2115  azithromycin (ZITHROMAX) 500 mg in dextrose 5 % 250 mL IVPB     500 mg 250 mL/hr over 60 Minutes Intravenous  Once 12/26/15 2100 12/27/15 0028   12/26/15 2115  oseltamivir (TAMIFLU) capsule 75 mg     75 mg Oral  Once 12/26/15 2100 12/26/15 2155      Family Communication: no family was present at bedside, at the time of interview.    Disposition:  Barriers to safe discharge: Improvement in cardiac and respiratory function    Intake/Output Summary (Last 24 hours) at 01/03/16 1052 Last data filed at 01/03/16 1036  Gross per 24 hour  Intake    246 ml  Output  0 ml  Net    246 ml   Filed Weights   12/27/15 0058 12/29/15 0706  Weight: 65.7 kg (144 lb 13.5 oz) 67.8 kg (149 lb 7.6 oz)    Objective: Physical Exam: Filed Vitals:   01/02/16 2128 01/03/16 0522 01/03/16 0920 01/03/16 1019  BP: 106/75 98/69  115/82  Pulse: 95 82  88  Temp: 98 F (36.7 C) 97.6 F (36.4 C)     TempSrc: Oral Oral    Resp: 18 18    Height:      Weight:      SpO2: 99% 100% 95%     General: Appear in mild distress, no Rash; Oral Mucosa moist. Cardiovascular: S1 and S2 Present, no Murmur, no JVD Respiratory: Bilateral Air entry present and no Crackles, bilateral wheezes Abdomen: Bowel Sound present, Soft and no tenderness Extremities: no Pedal edema, no calf tenderness  Data Reviewed: CBC:  Recent Labs Lab 12/29/15 1649 12/30/15 0340 12/31/15 0142 01/01/16 0330 01/03/16 0414  WBC 14.7* 11.9* 10.6* 9.3 9.6  HGB 13.4 12.6* 12.4* 11.4* 12.2*  HCT 40.3 38.3* 37.6* 34.5* 38.0*  MCV 97.3 97.5 97.7 100.0 98.2  PLT 336 293 306 294 315   Basic Metabolic Panel:  Recent Labs Lab 12/28/15 1532  12/30/15 0340 12/31/15 0142 01/01/16 0330 01/02/16 1242 01/03/16 0414  NA  --   < > 139 139 136 138 138  K  --   < > 5.0 5.2* 5.6* 4.5 4.4  CL  --   < > 97* 99* 99* 100* 101  CO2  --   < > GLUCOSE  --   < > 148* 138* 136* 114* 105*  BUN  --   < > 31* 37* 33* 27* 21*  CREATININE  --   < > 1.28* 1.12 0.96 0.88 1.04  CALCIUM  --   < > 9.3 9.1 8.9 9.0 8.6*  MG 2.2  --   --   --   --   --  2.1  PHOS 2.9  --   --   --   --   --   --   < > = values in this interval not displayed. Liver Function Tests:  Recent Labs Lab 01/01/16 0330  AST 36  ALT 31  ALKPHOS 50  BILITOT 0.2*  PROT 5.8*  ALBUMIN 2.9*   No results for input(s): LIPASE, AMYLASE in the last 168 hours. No results for input(s): AMMONIA in the last 168 hours.  Cardiac Enzymes:  Recent Labs Lab 12/27/15 1703 12/27/15 2015 12/28/15 0238  TROPONINI 1.22* 0.92* 0.83*    BNP (last 3 results)  Recent Labs  12/27/15 1703  BNP 1306.4*    CBG:  Recent Labs Lab 12/27/15 1142 12/27/15 1748  GLUCAP 221* 145*    Recent Results (from the past 240 hour(s))  MRSA PCR Screening     Status: None   Collection Time: 12/27/15 12:52 AM  Result Value Ref Range Status   MRSA by PCR NEGATIVE  NEGATIVE Final    Comment:        The GeneXpert MRSA Assay (FDA approved for NASAL specimens only), is one component of a comprehensive MRSA colonization surveillance program. It is not intended to diagnose MRSA infection nor to guide or monitor treatment for MRSA infections.   Culture, respiratory (NON-Expectorated)     Status: None   Collection Time: 12/27/15  2:12 PM  Result Value Ref Range Status   Specimen  Description TRACHEAL ASPIRATE  Final   Special Requests NONE  Final   Gram Stain   Final    FEW WBC PRESENT,BOTH PMN AND MONONUCLEAR NO SQUAMOUS EPITHELIAL CELLS SEEN NO ORGANISMS SEEN Performed at [facility]    Culture   Final    NORMAL OROPHARYNGEAL FLORA Performed at [facility]    Report Status 12/29/2015 FINAL  Final  Respiratory virus panel     Status: None   Collection Time: 12/27/15  5:27 PM  Result Value Ref Range Status   Source - RVPAN NASAL SWAB  Corrected   Respiratory Syncytial Virus A Negative Negative Final   Respiratory Syncytial Virus B Negative Negative Final   Influenza A Negative Negative Final   Influenza B Negative Negative Final   Parainfluenza 1 Negative Negative Final   Parainfluenza 2 Negative Negative Final   Parainfluenza 3 Negative Negative Final   Metapneumovirus Negative Negative Final   Rhinovirus Negative Negative Final   Adenovirus Negative Negative Final    Comment: (NOTE) Performed At: [facility] [address] [name], [place] [id number] Mila Homer MD WU:[id number]     Studies: No results found.   Scheduled Meds: . [START ON 01/08/2016] amiodarone  200 mg Oral Daily  . amiodarone  400 mg Oral 3 times per day  . antiseptic oral rinse  7 mL Mouth Rinse BID  . aspirin EC  81 mg Oral Daily  . atorvastatin  80 mg Oral q1800  . enoxaparin (LOVENOX) injection  1 mg/kg Subcutaneous Q12H  . methylPREDNISolone (SOLU-MEDROL) injection  40 mg Intravenous Q24H  . metoprolol tartrate  25 mg Oral  BID  . mometasone-formoterol  2 puff Inhalation BID  . sodium chloride flush  3 mL Intravenous Q12H  . tiotropium  18 mcg Inhalation Daily   Continuous Infusions:  PRN Meds: sodium chloride, acetaminophen **OR** acetaminophen, alum & mag hydroxide-simeth, ipratropium-albuterol, ondansetron **OR** ondansetron (ZOFRAN) IV, sodium chloride flush  Time spent: 30 minutes  Author: Lynden Oxford, MD [facility] Pager: [phone] 01/03/2016 10:52 AM  If 7PM-7AM, please contact night-coverage at [email or web address], password [email or web address]

## 2016-01-03 NOTE — Progress Notes (Signed)
Utilization review completed.  

## 2016-01-03 NOTE — Progress Notes (Signed)
PATIENT ID: [id number] with severe AS, COPD, hypertension, and CAD here with NSTEMI found to have severe AS and a dilated ascending thoracic aorta.  Awaiting CABG, AVR and aortic root replacement.  INTERVAL HISTORY: Short runs of atrial fibrillation  SUBJECTIVE:  [name] is feeling well.  He denies chest pain and states that his breathing has improved.  He walked this am and did well.  As long as he has supplemental O2 he is not short of breath.  He denies palpitations, lightheadedness or dizziness.   PHYSICAL EXAM Filed Vitals:   01/02/16 1513 01/02/16 2118 01/02/16 2128 01/03/16 0522  BP: 100/69  106/75 98/69  Pulse: 59  95 82  Temp: 97.8 F (36.6 C)  98 F (36.7 C) 97.6 F (36.4 C)  TempSrc: Oral  Oral Oral  Resp: Height:      Weight:      SpO2: 98% 97% 99% 100%   General:   Well-appearing.  NAD. Neck: No JVD Lungs:  CTAB Heart:  RRR.  III/VI late-peaking systolic murmur at the RUSB.  No rubs or gallops. Abdomen:  Soft, NT, ND.  +BS Extremities:  WWP.  No edema.   LABS: Lab Results  Component Value Date   TROPONINI 0.83* 12/28/2015   Results for orders placed or performed during the hospital encounter of 12/26/15 (from the past 24 hour(s))  Basic metabolic panel     Status: Abnormal   Collection Time: 01/02/16 12:42 PM  Result Value Ref Range   Sodium 138 135 - 145 mmol/L   Potassium 4.5 3.5 - 5.1 mmol/L   Chloride 100 (L) 101 - 111 mmol/L   CO2 27 22 - 32 mmol/L   Glucose, Bld 114 (H) 65 - 99 mg/dL   BUN 27 (H) 6 - 20 mg/dL   Creatinine, Ser 2.95 0.61 - 1.24 mg/dL   Calcium 9.0 8.9 - 62.1 mg/dL   GFR calc non Af Amer >60 >60 mL/min   GFR calc Af Amer >60 >60 mL/min   Anion gap 11 5 - 15  Basic metabolic panel     Status: Abnormal   Collection Time: 01/03/16  4:14 AM  Result Value Ref Range   Sodium 138 135 - 145 mmol/L   Potassium 4.4 3.5 - 5.1 mmol/L   Chloride 101 101 - 111 mmol/L   CO2 31 22 - 32 mmol/L   Glucose, Bld 105 (H) 65 - 99 mg/dL     BUN 21 (H) 6 - 20 mg/dL   Creatinine, Ser 3.08 0.61 - 1.24 mg/dL   Calcium 8.6 (L) 8.9 - 10.3 mg/dL   GFR calc non Af Amer >60 >60 mL/min   GFR calc Af Amer >60 >60 mL/min   Anion gap 6 5 - 15  CBC     Status: Abnormal   Collection Time: 01/03/16  4:14 AM  Result Value Ref Range   WBC 9.6 4.0 - 10.5 K/uL   RBC 3.87 (L) 4.22 - 5.81 MIL/uL   Hemoglobin 12.2 (L) 13.0 - 17.0 g/dL   HCT 65.7 (L) 84.6 - 96.2 %   MCV 98.2 78.0 - 100.0 fL   MCH 31.5 26.0 - 34.0 pg   MCHC 32.1 30.0 - 36.0 g/dL   RDW 95.2 84.1 - 32.4 %   Platelets 315 150 - 400 K/uL  Magnesium     Status: None   Collection Time: 01/03/16  4:14 AM  Result Value Ref Range   Magnesium 2.1 1.7 -  2.4 mg/dL  Protime-INR     Status: None   Collection Time: 01/03/16  4:14 AM  Result Value Ref Range   Prothrombin Time 14.4 11.6 - 15.2 seconds   INR 1.10 0.00 - 1.49    Intake/Output Summary (Last 24 hours) at 01/03/16 5409 Last data filed at 01/02/16 2030  Gross per 24 hour  Intake    243 ml  Output      0 ml  Net    243 ml    Telemetry: Short run of atrial fibrillation at 7:30 on 2/25.  ASSESSMENT AND PLAN:  Principal Problem:   Acute respiratory failure with hypoxia (HCC) Active Problems:   CAP (community acquired pneumonia)   NSTEMI (non-ST elevated myocardial infarction) (HCC)   COPD (chronic obstructive pulmonary disease) (HCC)   Hypoxia   Elevated troponin   History of hypertension   History of hyperlipidemia   Ascending aortic aneurysm (HCC)   Aortic stenosis   Acute respiratory failure (HCC)   CAD (coronary artery disease)   Coronary artery disease involving native coronary artery of native heart with unstable angina pectoris (HCC)    # Paroxysmal atrial fibrillation with RVR: [name] was found to have a short run of atrial fibrillation yesterday lasting <10 seconds.  He was asymptomatic.  Given that this is likely to recur in the post-operative period and his BP is too low to titrate nodal  agents, we will start amiodarone 400 mg q8h 5 days followed by 200 mg daily.  He is already on lovenox therapeutic dosing.  This patients CHA2DS2-VASc Score and unadjusted Ischemic Stroke Rate (% per year) is equal to 2.2 % stroke rate/year from a score of 2.  This can likely be stopped by his post-op visit.  Above score calculated as 1 point each if present [CHF, HTN, DM, Vascular=MI/PAD/Aortic Plaque, Age if 65-74, or Male] Above score calculated as 2 points each if present [Age > 75, or Stroke/TIA/TE]  # Severe aortic stenosis: Awaiting AVR on Thursday.  # NSTEMI: Mr. Valenza has LM disease and 3 vessel CAD.  CABG Thursday.  Continue aspirin, lovenox, metoprolol and atorvastatin.  # Chronic systolic and diastolic heart failure:  Ischemic cardiomyopathy with LVEF 35-40%.  Continue metoprolol and start ACE-I/ARB when permitted by BP and potassium.  Time spent: 25 minutes-Greater than 50% of this time was spent in counseling, explanation of diagnosis, planning of further management, and coordination of care.    Patra Gherardi C. Duke Salvia, MD, [facility] 01/03/2016 8:28 AM

## 2016-01-04 ENCOUNTER — Other Ambulatory Visit: Payer: Self-pay | Admitting: *Deleted

## 2016-01-04 ENCOUNTER — Inpatient Hospital Stay (HOSPITAL_COMMUNITY): Payer: Medicaid Other

## 2016-01-04 DIAGNOSIS — I2510 Atherosclerotic heart disease of native coronary artery without angina pectoris: Secondary | ICD-10-CM

## 2016-01-04 DIAGNOSIS — J439 Emphysema, unspecified: Secondary | ICD-10-CM

## 2016-01-04 DIAGNOSIS — I350 Nonrheumatic aortic (valve) stenosis: Secondary | ICD-10-CM

## 2016-01-04 LAB — PULMONARY FUNCTION TEST
DL/VA % pred: 34 %
DL/VA: 1.57 ml/min/mmHg/L
DLCO COR: 8.77 ml/min/mmHg
DLCO UNC % PRED: 26 %
DLCO UNC: 8.35 ml/min/mmHg
DLCO cor % pred: 28 %
FEF 25-75 PRE: 0.31 L/s
FEF 25-75 Post: 0.35 L/sec
FEF2575-%CHANGE-POST: 13 %
FEF2575-%PRED-PRE: 11 %
FEF2575-%Pred-Post: 13 %
FEV1-%Change-Post: 4 %
FEV1-%PRED-POST: 37 %
FEV1-%PRED-PRE: 35 %
FEV1-POST: 1.26 L
FEV1-Pre: 1.2 L
FEV1FVC-%CHANGE-POST: 1 %
FEV1FVC-%Pred-Pre: 44 %
FEV6-%CHANGE-POST: 3 %
FEV6-%PRED-PRE: 62 %
FEV6-%Pred-Post: 65 %
FEV6-POST: 2.77 L
FEV6-PRE: 2.67 L
FEV6FVC-%Change-Post: 1 %
FEV6FVC-%PRED-POST: 78 %
FEV6FVC-%Pred-Pre: 77 %
FVC-%Change-Post: 3 %
FVC-%Pred-Post: 83 %
FVC-%Pred-Pre: 81 %
FVC-PRE: 3.62 L
FVC-Post: 3.73 L
POST FEV6/FVC RATIO: 75 %
PRE FEV1/FVC RATIO: 33 %
Post FEV1/FVC ratio: 34 %
Pre FEV6/FVC Ratio: 74 %
RV % pred: 102 %
RV: 2.34 L
TLC % PRED: 90 %
TLC: 6.17 L

## 2016-01-04 LAB — CBC
HCT: 39.4 % (ref 39.0–52.0)
HEMOGLOBIN: 13 g/dL (ref 13.0–17.0)
MCH: 32.2 pg (ref 26.0–34.0)
MCHC: 33 g/dL (ref 30.0–36.0)
MCV: 97.5 fL (ref 78.0–100.0)
Platelets: 315 10*3/uL (ref 150–400)
RBC: 4.04 MIL/uL — ABNORMAL LOW (ref 4.22–5.81)
RDW: 12.8 % (ref 11.5–15.5)
WBC: 10.8 10*3/uL — ABNORMAL HIGH (ref 4.0–10.5)

## 2016-01-04 LAB — TSH: TSH: 2.395 u[IU]/mL (ref 0.350–4.500)

## 2016-01-04 LAB — T4, FREE: FREE T4: 1.24 ng/dL — AB (ref 0.61–1.12)

## 2016-01-04 MED ORDER — ALBUTEROL SULFATE (2.5 MG/3ML) 0.083% IN NEBU
2.5000 mg | INHALATION_SOLUTION | Freq: Once | RESPIRATORY_TRACT | Status: AC
  Administered 2016-01-04: 2.5 mg via RESPIRATORY_TRACT

## 2016-01-04 MED ORDER — PREDNISONE 20 MG PO TABS
40.0000 mg | ORAL_TABLET | Freq: Every day | ORAL | Status: DC
  Administered 2016-01-05 – 2016-01-06 (×2): 40 mg via ORAL
  Filled 2016-01-04 (×2): qty 2

## 2016-01-04 MED ORDER — LISINOPRIL 2.5 MG PO TABS
2.5000 mg | ORAL_TABLET | Freq: Every day | ORAL | Status: DC
  Administered 2016-01-04 – 2016-01-06 (×3): 2.5 mg via ORAL
  Filled 2016-01-04 (×3): qty 1

## 2016-01-04 NOTE — Progress Notes (Signed)
PULMONARY / CRITICAL CARE MEDICINE   Name: Craig [name] MRN: [id number] DOB: January 01, 1951    ADMISSION DATE:  12/26/2015 CONSULTATION DATE:  12/27/15  REFERRING MD:  Dr. David Stall  CHIEF COMPLAINT:  Acute Hypoxic Respiratory Failure  HISTORY OF PRESENT ILLNESS:   65 y/o M with PMH of emphysema / COPD admitted 2/18 with increased SOB, chest tightness. Found to have RLL PNA. Flu negative. Elevated troponin in setting of demand ischemia. Decompensated on 2/19 requiring brief intubation for acute pulmonary edema Echo showed severe AS with EF of 35% Cath showed 3 vessel CAD   SUBJECTIVE:  No CP Up in chair on 2L [place]    VITAL SIGNS: BP 101/78 mmHg  Pulse 61  Temp(Src) 97.5 F (36.4 C) (Oral)  Resp 18  Ht  (1.753 m)  Wt 67.8 kg (149 lb 7.6 oz)  BMI 22.06 kg/m2  SpO2 99%  HEMODYNAMICS:    VENTILATOR SETTINGS:    INTAKE / OUTPUT: I/O last 3 completed shifts: In: 483 [P.O.:480; I.V.:3] Out: -   PHYSICAL EXAMINATION: General:  Thin adult male Neuro:  AAOx3, interactive, in good spirits HEENT:  MM, NCAT.  Cardiovascular:  s1s2 rrr, difficult to hear murmur, no carotid radiation  Lungs:  Less labored , lungs decreased bilaterally at the bases.  Abdomen:  Soft, bsx4 active Musculoskeletal:  No acute deformities  Skin: no edema, rashes or lesions  LABS:  BMET  Recent Labs Lab 01/01/16 0330 01/02/16 1242 01/03/16 0414  NA 136 138 138  K 5.6* 4.5 4.4  CL 99* 100* 101  CO2 BUN 33* 27* 21*  CREATININE 0.96 0.88 1.04  GLUCOSE 136* 114* 105*    Electrolytes  Recent Labs Lab 12/28/15 1532  01/01/16 0330 01/02/16 1242 01/03/16 0414  CALCIUM  --   < > 8.9 9.0 8.6*  MG 2.2  --   --   --  2.1  PHOS 2.9  --   --   --   --   < > = values in this interval not displayed.  CBC  Recent Labs Lab 01/01/16 0330 01/03/16 0414 01/04/16 0525  WBC 9.3 9.6 10.8*  HGB 11.4* 12.2* 13.0  HCT 34.5* 38.0* 39.4  PLT 294 315 315     Coag's  Recent Labs Lab 12/29/15 0520 01/03/16 0414  INR 1.15 1.10    Sepsis Markers No results for input(s): LATICACIDVEN, PROCALCITON, O2SATVEN in the last 168 hours.  ABG  Recent Labs Lab 12/29/15 1407  PHART 7.409  PCO2ART 36.8  PO2ART 58.0*    Liver Enzymes  Recent Labs Lab 01/01/16 0330  AST 36  ALT 31  ALKPHOS 50  BILITOT 0.2*  ALBUMIN 2.9*    Cardiac Enzymes No results for input(s): TROPONINI, PROBNP in the last 168 hours.  Glucose No results for input(s): GLUCAP in the last 168 hours.  Imaging No results found.   STUDIES:  2/18  CTA Chest >> negative for PE, emphysema, RUL consolidation, small R pleural effusion, dilation of ascending aortia, proximal arch measuring up to 5.2 cm without dissection, and ascending thoracic aortic aneurysm.  2/20 echo >> sever AS, EF 35% 2/21 cath  CULTURES: Tracheal Aspirate 2/19 >> ng Flu 2/18 >> negative   ANTIBIOTICS: Rocephin 2/18 >>  Azithromycin 2/18 >>  Tamiflu 2/18 >> 2/21  SIGNIFICANT EVENTS: 2/18  Admit with SOB, RUL PNA.  PE ruled out.  Elevated trop leak thought demand ischemia.   2/19  Decompensated requiring intubation due to  resp distress  2/19 self extubated  LINES/TUBES: ETT 2/19 >> 2/19  DISCUSSION: 65 y/o M with PMH of emphysema / COPD admitted 2/18 with increased SOB, chest tightness.  Found to have RLL PNA.  Flu negative. Elevated troponin in setting of demand ischemia.  Decompensated on 2/19 requiring brief  intubation.    ASSESSMENT / PLAN:  PULMONARY A: Acute Hypoxic Respiratory Failure - in the setting of RLL PNA, Severe Emphysema with acute exacerbation, possible component of pulmonary edema. Flu negative PCR.  Acute Exacerbation of COPD  Hx Tobacco Abuse  Pneumonia P:   Duoneb Q6 with Q3 PRN albuterol  Started on spiriva Pred 40 - solumedrol 40 on day of surgery Await PFT's .   CARDIOVASCULAR A:  NSTEMI Severe Aortic Stenosis Aortic Root dilation 3V-  CAD Frequent PVC's  P:  Appreciate Cards, CT surg.   Continue Lopressor for HR control.   Tolerating metoprolol Continue lipitor, ASA  Will follow,  Cyril Mourning MD. FCCP. [facility] Pager [phone] If no response call [phone]

## 2016-01-04 NOTE — Progress Notes (Signed)
Patient Name: Craig [name] Date of Encounter: 01/04/2016     Principal Problem:   Acute respiratory failure with hypoxia (HCC) Active Problems:   CAP (community acquired pneumonia)   NSTEMI (non-ST elevated myocardial infarction) (HCC)   COPD (chronic obstructive pulmonary disease) (HCC)   Hypoxia   Elevated troponin   History of hypertension   History of hyperlipidemia   Ascending aortic aneurysm (HCC)   Aortic stenosis   Acute respiratory failure (HCC)   CAD (coronary artery disease)   Coronary artery disease involving native coronary artery of native heart with unstable angina pectoris (HCC)    SUBJECTIVE  Denies any CP or SOB. Feeling well.   CURRENT MEDS . [START ON 01/08/2016] amiodarone  200 mg Oral Daily  . amiodarone  400 mg Oral 3 times per day  . antiseptic oral rinse  7 mL Mouth Rinse BID  . aspirin EC  81 mg Oral Daily  . atorvastatin  80 mg Oral q1800  . enoxaparin (LOVENOX) injection  1 mg/kg Subcutaneous Q12H  . lisinopril  2.5 mg Oral Daily  . metoprolol tartrate  25 mg Oral BID  . mometasone-formoterol  2 puff Inhalation BID  . [START ON 01/05/2016] predniSONE  40 mg Oral Q breakfast  . sodium chloride flush  3 mL Intravenous Q12H  . tiotropium  18 mcg Inhalation Daily    OBJECTIVE  Filed Vitals:   01/03/16 2004 01/03/16 2138 01/04/16 0608 01/04/16 1220  BP:  113/74 101/78 111/85  Pulse:  61 61 70  Temp:  97.5 F (36.4 C) 97.5 F (36.4 C) 99.1 F (37.3 C)  TempSrc:  Oral Oral Oral  Resp:  18 18 19   Height:      Weight:      SpO2: 98% 96% 99% 100%    Intake/Output Summary (Last 24 hours) at 01/04/16 1536 Last data filed at 01/04/16 0830  Gross per 24 hour  Intake    480 ml  Output      0 ml  Net    480 ml   Filed Weights   12/27/15 0058 12/29/15 0706  Weight: 144 lb 13.5 oz (65.7 kg) 149 lb 7.6 oz (67.8 kg)    PHYSICAL EXAM  General: Pleasant, NAD. Neuro: Alert and oriented X 3. Moves all extremities spontaneously. Psych:  Normal affect. HEENT:  Normal  Neck: Supple without bruits or JVD. Lungs:  Resp regular and unlabored. Decreased breath sound bilaterally.  Heart: RRR no s3, s4, or murmurs. Abdomen: Soft, non-tender, non-distended, BS + x 4.  Extremities: No clubbing, cyanosis or edema. DP/PT/Radials 2+ and equal bilaterally.  Accessory Clinical Findings  CBC  Recent Labs  01/03/16 0414 01/04/16 0525  WBC 9.6 10.8*  HGB 12.2* 13.0  HCT 38.0* 39.4  MCV 98.2 97.5  PLT 315 315   Basic Metabolic Panel  Recent Labs  01/02/16 1242 01/03/16 0414  NA 138 138  K 4.5 4.4  CL 100* 101  CO2 27 31  GLUCOSE 114* 105*  BUN 27* 21*  CREATININE 0.88 1.04  CALCIUM 9.0 8.6*  MG  --  2.1   Thyroid Function Tests  Recent Labs  01/04/16 0525  TSH 2.395    TELE NSR with frequent PVCs    ECG  No new EKG  Echocardiogram 12/28/2015  LV EF: 35% -  40%  ------------------------------------------------------------------- Indications:   Chest pain 786.51.  ------------------------------------------------------------------- History:  PMH: Elevated troponin. Chronic obstructive pulmonary disease. PMH:  Myocardial infarction. Risk factors: Hypertension. Dyslipidemia.  -------------------------------------------------------------------  Study Conclusions  - Left ventricle: The cavity size was normal. There was moderate concentric hypertrophy. Systolic function was moderately reduced. The estimated ejection fraction was in the range of 35% to 40%. There is akinesis of the mid-apicalinferolateral myocardium. There is akinesis of the basal-midinferoseptal myocardium. Doppler parameters are consistent with high ventricular filling pressure. - Aortic valve: The AV is heavily calcified and the right coronary cusp is fixed and immobile. The mean gradient and peak velocity are consistent with moderate AS but this is most likely underestimated but LV dysfunction with  reduced Cardiac output The calculated AVA is consistent with severe AS and visually the valve mobility is severely reduced. Valve area (VTI): 0.91 cm^2. Valve area (Vmax): 0.82 cm^2. Valve area (Vmean): 0.87 cm^2. - Aorta: Aortic root dimension: 42 mm (ED). - Mitral valve: Calcified annulus. There was mild regurgitation. - Left atrium: The atrium was mildly dilated. - Right ventricle: The RV apex appears akinetic.    Radiology/Studies  Dg Chest 2 View  12/26/2015  CLINICAL DATA:  Shortness of breath yesterday, progressive today. Occasional cough. EXAM: CHEST  2 VIEW COMPARISON:  Most recent chest radiograph 05/27/2009 FINDINGS: Advanced emphysematous change with hyperinflation. Diffuse increased interstitial markings may reflect superimposed pulmonary edema. Blunting of the costophrenic angles, right greater than left, pleural effusions versus hyperinflation. Patchy airspace disease in the posterior right upper lobe. Heart is normal in size, mediastinal contours are normal. No pneumothorax. Remote bilateral rib fractures. IMPRESSION: 1. Patchy right upper lobe opacity, concerning for pneumonia. Followup PA and lateral chest X-ray is recommended in 3-4 weeks following trial of antibiotic therapy to ensure resolution and exclude underlying malignancy. 2. Advanced emphysema. Increased interstitial markings from prior exam may reflect superimposed edema. Small pleural effusions versus hyperinflation. Question degree of volume overload/CHF, with concurrent pneumonia, all superimposed on chronic lung disease. Electronically Signed   By: Rubye Oaks M.D.   On: 12/26/2015 18:11   Ct Angio Chest Pe W/cm &/or Wo Cm  12/26/2015  CLINICAL DATA:  65 year old male with shortness of breath copy, tachycardia. EXAM: CT ANGIOGRAPHY CHEST WITH CONTRAST TECHNIQUE: Multidetector CT imaging of the chest was performed using the standard protocol during bolus administration of intravenous contrast. Multiplanar CT  image reconstructions and MIPs were obtained to evaluate the vascular anatomy. CONTRAST:  80mL OMNIPAQUE IOHEXOL 350 MG/ML SOLN COMPARISON:  Radiograph dated 12/26/2015 FINDINGS: There is emphysematous changes of the lungs. Patchy area of consolidative changes at the right lung base as well as area of interstitial thickening in the right apical region are concerning for pneumonia. There is a small right pleural effusion. No pneumothorax. The central airways are patent. There is mild bronchiectatic changes with mild thickening of the bronchial wall. There is diffuse dilatation of the ascending aorta with involvement of the proximal arch measuring up to 5.2 cm in diameter. Evaluation of the aorta is limited as timing of the contrast was designed for optimal opacification of the pulmonary arteries. No definite dissection identified. There is no periaortic fluid. There is atherosclerotic calcification of thoracic aorta. No CT evidence of pulmonary embolism. There is no cardiomegaly or significant pericardial effusion. There is coronary vascular calcification. There is calcification of the aortic valves. There is thickened appearance of the left ventricular myocardium, likely myocardial hypertrophy. Mild dilatation of the left atrium. Correlation with echocardiogram recommended. There is retrograde flow of contrast from the right atrium into the IVC compatible with a degree of right cardiac dysfunction. Right hilar and subcarinal adenopathy. The esophagus and the thyroid  gland are grossly unremarkable. There is no axillary adenopathy. The chest wall soft tissues appear unremarkable. Old healed bilateral rib fractures. No acute fracture. The visualized upper abdomen appear grossly unremarkable. Review of the MIP images confirms the above findings. IMPRESSION: No CT evidence of pulmonary embolism. Emphysema with an area of consolidative change in the right lung base compatible with pneumonia. Small right pleural effusion.  Diffuse dilatation of the ascending aorta and proximal arch measuring up to 5.2 cm. Evaluation of the aorta is limited on this study as timing of the contrast was designed to opacifying the pulmonary vasculature. No definite dissection identified. CT angiography with optimal opacification of the aorta may provide additional evaluation. Ascending thoracic aortic aneurysm. Recommend semi-annual imaging followup by CTA or MRA and referral to cardiothoracic surgery if not already obtained. This recommendation follows 2010 ACCF/AHA/AATS/ACR/ASA/SCA/SCAI/SIR/STS/SVM Guidelines for the Diagnosis and Management of Patients With Thoracic Aortic Disease. Circulation. 2010; 121: A540-J811 Calcification of the aortic valves with probable resulting aortic valve stenosis. Dilatation of the ascending aorta may represent post stenotic aneurysm. There is also left ventricular hypertrophy and mild dilatation of the left atrium. Echocardiogram is recommended for further evaluation. Electronically Signed   By: Elgie Collard M.D.   On: 12/26/2015 21:19   Dg Chest Port 1 View  12/28/2015  CLINICAL DATA:  Acute respiratory failure. The patient extubated himself yesterday. EXAM: PORTABLE CHEST 1 VIEW COMPARISON:  Yesterday. FINDINGS: The cardiac silhouette remains borderline enlarged. Decreased bilateral airspace opacity. Stable bilateral pleural effusions and prominence of the pulmonary vasculature and interstitial markings. No acute bony abnormality. IMPRESSION: 1. Improving congestive heart failure superimposed on COPD. 2. Persistent probable atelectasis at the right lung base and mild left basilar atelectasis. Electronically Signed   By: Beckie Salts M.D.   On: 12/28/2015 07:14   Dg Chest Port 1 View  12/27/2015  CLINICAL DATA:  Intubated. EXAM: PORTABLE CHEST 1 VIEW COMPARISON:  Yesterday. FINDINGS: Borderline enlarged cardiac silhouette with a mild increase in size. Increased airspace opacity throughout the right lung and  the majority of the left lung. Increased size of small bilateral pleural effusions. Endotracheal tube in satisfactory position. Nasogastric tube tip in the mid stomach. Diffuse osteopenia. IMPRESSION: Worsening changes of congestive heart failure superimposed on COPD. Underlying pneumonia cannot be excluded. Electronically Signed   By: Beckie Salts M.D.   On: 12/27/2015 13:42   Dg Abd Portable 1v  12/27/2015  CLINICAL DATA:  Encounter for orogastric tube placement. EXAM: PORTABLE ABDOMEN - 1 VIEW COMPARISON:  None. FINDINGS: Oral/nasogastric tube passes below the diaphragm extending well into the stomach. Tip lies in the mid to distal stomach. Normal bowel gas pattern. IMPRESSION: Oral/nasogastric tube is well positioned extending well into the stomach. Electronically Signed   By: Amie Portland M.D.   On: 12/27/2015 13:44    ASSESSMENT AND PLAN  65 yo M w a h/o COPD, HTN (by record, though normotensive here, not on treatment), HLD (by record, though lipid panel unremarkable here, not on treatment), & heavy prior alcohol (12 pack per day x 30 years, quit 2004), & tobacco use (~150 year pack history, quit 2010) p/w 1 day of diffuse chest discomfort in the context of 6 days of fevers, coughing productive of "milky phlegm" & dyspnea, found to be hypoxic to 91% in the ED due to pneumonia. Found to have dilated ascending aortic aneurysm and severe AS. Intubated on 2/19, self extubated on 12/28/2015. Echo 2/20 showed EF 35-40%, with wall motion abnormality, severe AS, mild  MR, 42mm aortic root, akinetic RV apex. Cath 2/21 showed 3v CAD with 50 ost LM. Plan for CABG/AVR/Aortic root replacement at the same time when R PNA resolve and stable from pulm perspective. He has been having some PAF as well, amiodarone started.   1. PAF  - started on amiodarone PO  TID for 5 days, then transition to  daily. May use IV amiodarone during perioperative phase  - continue lovenox, CHA2DS2-Vasc score 2 (CAD, HTN)  -  continue ASA, BB, ACEI, lipitor  2. NSTEMI with 3v CAD with 50% ost LM  - cath 12/29/2015 50% ost LM, diffuse 50% prox to mid LAD, 70% distal LAD, 70% prox LCx, 90% prox RCA  - seen by CT surgery, plan for CABG/aortic root replacement/AVR this Thursday  - plavix held, last dose 2/21. PFT today showed severe obstructive disease, CT surgery to review.   3. Severe AS  4. Dilated aortic root  5. 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 Agree with assessment and plan. No chest pain or dyspnea at rest. PFT done today demonstrating severe disease. Should not be on amiodarone long term. Plan for extensive surgery with CABG, AVR and aortic root replacement later this week.  Lennette Bihari, MD, [facility] 01/04/2016 4:15 PM

## 2016-01-04 NOTE — Progress Notes (Signed)
6 Days Post-Op Procedure(s) (LRB): Right/Left Heart Cath and Coronary Angiography (N/A) Subjective: No complaints, walked well without shortness of breath.  Objective: Vital signs in last 24 hours: Temp:  [97.5 F (36.4 C)-99.1 F (37.3 C)] 99.1 F (37.3 C) (02/27 1220) Pulse Rate:  [61-70] 70 (02/27 1220) Cardiac Rhythm:  [-] Normal sinus rhythm (02/27 0700) Resp:  [18-19] 19 (02/27 1220) BP: (101-113)/(74-85) 111/85 mmHg (02/27 1220) SpO2:  [96 %-100 %] 100 % (02/27 1220)  Hemodynamic parameters for last 24 hours:    Intake/Output from previous day: 02/26 0701 - 02/27 0700 In: 243 [P.O.:240; I.V.:3] Out: -  Intake/Output this shift:    General appearance: alert and cooperative Neurologic: intact Heart: regular rate and rhythm and 2/6 systolic murmur RUSB Lungs: diminished breath sounds throughout Extremities: extremities normal, atraumatic, no cyanosis or edema  Lab Results:  Recent Labs  01/03/16 0414 01/04/16 0525  WBC 9.6 10.8*  HGB 12.2* 13.0  HCT 38.0* 39.4  PLT 315 315   BMET:  Recent Labs  01/02/16 1242 01/03/16 0414  NA 138 138  K 4.5 4.4  CL 100* 101  CO2 27 31  GLUCOSE 114* 105*  BUN 27* 21*  CREATININE 0.88 1.04  CALCIUM 9.0 8.6*    PT/INR:  Recent Labs  01/03/16 0414  LABPROT 14.4  INR 1.10   ABG    Component Value Date/Time   PHART 7.409 12/29/2015 1407   HCO3 25.1* 12/29/2015 1415   TCO2 26 12/29/2015 1415   ACIDBASEDEF 1.0 12/29/2015 1415   O2SAT 57.0 12/29/2015 1415   CBG (last 3)  No results for input(s): GLUCAP in the last 72 hours.  Assessment/Plan:  LOS: 9 days   Severe multi-vessel coronary disease with moderate LV dysfunction and LVEF of 35-40%.  Severe AS  Aortic root, ascending and arch aneurysm.  Right lung pneumonia and respiratory failure which could have been precipitated by heart failure in this gentleman with a long prior smoking history and COPD with chronic exertional dyspnea. His PFT's look pretty  bad with FEV1 of 1.20 and DLCO 28% predicted, no overinflation. His lungs looks bad on his admission CT. Will discuss with Dr. Vassie Loll. I think his pulmonary risk with surgery is high given the magnitude of the surgery and length of surgery due to that. He needs CABG x 4, replacement of his aortic valve, root, ascending aorta and aortic arch. I don't know if his heart and lungs will tolerate this magnitude of surgery but without surgery he will continue to have problems with heart failure and pulmonary edema. TAVR is not an option with his 5.2 cm aortic aneurysm and coronary disease.  Alleen Borne 01/04/2016

## 2016-01-04 NOTE — Progress Notes (Signed)
Patient lying in bed, no pain, distress or needs expressed at this time. Call light within reach. 

## 2016-01-04 NOTE — Progress Notes (Signed)
Patient Demographics:    Craig [name], is a 65 y.o. male, DOB - 1951/08/12, ZOX:[id number]  Admit date - 12/26/2015   Admitting Physician Ron Parker, MD  Outpatient Primary MD for the patient is EDENFIELD, [place] Miners, PA-C  LOS - 9  Summary  65 y/o M with PMH of HTN, HLD, ETOH abuse, tobacco abuse and COPD who presented to [facility] on 12/26/15 with a 24 hour history of worsening shortness of breath, cough, chest tightness, fevers/chilld, and myalgias, RA saturation of 91%. CTA of the chest which was negative for PE but demonstrated emphysema, RUL consolidation, small R pleural effusion, dilation of ascending aortia, proximal arch measuring up to 5.2 cm without dissection, and ascending thoracic aortic aneurysm.   The patient was admitted per [facility] for community acquired PNA. He was treated with IV rocephin / azithromycin. Due to elevated troponin, Cardiology was consulted for evaluation. He was placed on a heparin gtt, nitro gtt, plavix, asa, and lopressor. Cardiology evaluation felt elevated troponin likely due to demand ischemia in the setting of PNA with underlying severe emphysema.   On 2/19, the patient had progressive respiratory distress and hypoxemia.Intubated and transferred to ICU. ICU patient self extubated herself, she was seen by cardiology, she also underwent left heart cath which confirmed triple vessel disease requiring CABG, echo revealed severe AS. Cardiothoracic surgery is following and plan is for open heart surgery on March 1st.   Chief Complaint  Patient presents with  . Shortness of Breath  . Cough        Subjective:    [name] today has, No headache, No chest pain, No abdominal pain - No Nausea, No new weakness tingling or numbness, No Cough - SOB.     Assessment  & Plan :     1. Acute hypoxic and hypercapnic respiratory failure due to COPD exacerbation and right lower lobe community-acquired pneumonia and acute on chronic systolic heart failure. Influenza negative, CT angiogram negative for PE, briefly intubated and treated in ICU. Now on nasal cannula oxygen much improvement, has finished antibiotics, continue supportive care. Pulmonary to reevaluate on 01/04/2016 and do PFTs in anticipation of open heart surgery.  2. 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 Compensated from a CHF standpoint, on beta blocker and low-dose ACE inhibitor if blood pressure tolerates. Cardiology following.  3. Paroxysmal atrial fibrillation with runs of SVT. [place] vasc 2 score of greater than 3. Currently on full dose Lovenox, amiodarone and beta blocker. Cardiology following.  4. Non-STEMI. Left heart catheterization showing triple-vessel disease, due for CABG, currently on aspirin, full dose Lovenox, beta blocker and statin for secondary prevention. Chest pain-free. Awaiting CABG.  5. Dyslipidemia. On statin continue.  6. Ascending aortic aneurysm noted on CT angiogram. Cardio thoracic surgery already following.  7. Carotid artery stenosis more than 50% on the left side. Continue secondary prevention with antiplatelet therapy and statin, outpatient stress surgery follow-up post discharge.    Code Status : Full  Family Communication  : None present  Disposition Plan  : Stay inpatient on telemetry  Consults  :  Cards, Cardiothoracic surgery, pulmonary,   Procedures  :  Carotid Artery duplex.   The vertebral arteries appear patent with antegrade flow. - Findings consistent with 1-39 percent stenosis involving the right internal carotid artery and the left internal carotid artery. - Dampened waveforms  throughout carotid system, possibly cardiac related. - Based on 2:1 ratio, appears to be >50% left ECA stenosis.  TTE  Left ventricle: The cavity size was normal. There was moderateconcentric hypertrophy. Systolic function was moderately reduced.The estimated ejection fraction was in the range of 35% to 40%.There is akinesis of the mid-apicalinferolateral myocardium.There is akinesis of the basal-midinferoseptal myocardium. Doppler parameters are consistent with high ventricular filling pressure. - Aortic valve: The AV is heavily calcified and the right coronarycusp is fixed and immobile. The mean gradient and peak velocityare consistent with moderate AS but this is most likelyunderestimated but LV dysfunction with reduced Cardiac output Thecalculated AVA is consistent with severe AS and visually the valve mobility is severely reduced. Valve area (VTI): 0.91 cm^2. Valve area (Vmax): 0.82 cm^2. Valve area (Vmean): 0.87 cm^2.  - Aorta: Aortic root dimension: 42 mm (ED). - Mitral valve: Calcified annulus. There was mild regurgitation. - Left atrium: The atrium was mildly dilated. - Right ventricle: The RV apex appears akinetic  CT angiogram chest. Confirming ascending aortic aneurysm  Right/Left Heart Cath and Coronary Angiography    Conclusion     Ost RCA to Prox RCA lesion, 90% stenosed.  Mid RCA lesion, 45% stenosed.  Ost LM lesion, 50% stenosed.  Ost LAD to Mid LAD lesion, 50% stenosed.  Dist LAD lesion, 70% stenosed.  Ost Cx to Prox Cx lesion, 70% stenosed.  1. 3 vessel obstructive CAD  - 50% ostial left main  - diffuse 50% proximal to mid LAD, 70% distal LAD- heavily calcified.  - 70% proximal LCx  -90% proximal RCA 2. Moderate pulmonary HTN with elevated LV filling pressures.  Plan: will need to consider for CABG and AVR. Plavix held.     SIGNIFICANT EVENTS: 2/18 Admit with SOB, RUL PNA. PE ruled out. Elevated trop leak thought demand ischemia.   2/19 Decompensated requiring intubation due to resp distress  2/19 self extubated  LINES/TUBES: ETT 2/19 >> 2/19   DVT Prophylaxis  :  Lovenox    Lab Results  Component Value Date   PLT 315 01/04/2016    Inpatient Medications  Scheduled Meds: . [START ON 01/08/2016] amiodarone  200 mg Oral Daily  . amiodarone  400 mg Oral 3 times per day  . antiseptic oral rinse  7 mL Mouth Rinse BID  . aspirin EC  81 mg Oral Daily  . atorvastatin  80 mg Oral q1800  . enoxaparin (LOVENOX) injection  1 mg/kg Subcutaneous Q12H  . metoprolol tartrate  25 mg Oral BID  . mometasone-formoterol  2 puff Inhalation BID  . [START ON 01/05/2016] predniSONE  40 mg Oral Q breakfast  . sodium chloride flush  3 mL Intravenous Q12H  . tiotropium  18 mcg Inhalation Daily   Continuous Infusions:  PRN Meds:.sodium chloride, acetaminophen **OR** acetaminophen, alum & mag hydroxide-simeth, ipratropium-albuterol, ondansetron **OR** ondansetron (ZOFRAN) IV, sodium chloride flush  Antibiotics  :     Anti-infectives    Start     Dose/Rate Route Frequency Ordered Stop   12/27/15 2200  cefTRIAXone (ROCEPHIN) 1 g in dextrose 5 % 50 mL IVPB     1 g 100 mL/hr over 30 Minutes Intravenous Every 24 hours 12/26/15 2140 01/01/16 2242   12/27/15 2200  azithromycin (ZITHROMAX) 500 mg in dextrose 5 %  250 mL IVPB  Status:  Discontinued     500 mg 250 mL/hr over 60 Minutes Intravenous Every 24 hours 12/26/15 2141 12/30/15 1142   12/26/15 2115  cefTRIAXone (ROCEPHIN) 1 g in dextrose 5 % 50 mL IVPB     1 g 100 mL/hr over 30 Minutes Intravenous  Once 12/26/15 2100 12/26/15 2245   12/26/15 2115  azithromycin (ZITHROMAX) 500 mg in dextrose 5 % 250 mL IVPB     500 mg 250 mL/hr over 60 Minutes Intravenous  Once 12/26/15 2100 12/27/15 0028   12/26/15 2115  oseltamivir (TAMIFLU) capsule 75 mg     75 mg Oral  Once 12/26/15 2100 12/26/15 2155        Objective:   Filed Vitals:   01/03/16 1019 01/03/16 2004 01/03/16 2138  01/04/16 0608  BP: 115/82  113/74 101/78  Pulse: 88  61 61  Temp:   97.5 F (36.4 C) 97.5 F (36.4 C)  TempSrc:   Oral Oral  Resp: 18  18 18   Height:      Weight:      SpO2: 98% 98% 96% 99%    Wt Readings from Last 3 Encounters:  12/29/15 67.8 kg (149 lb 7.6 oz)  03/31/15 66.951 kg (147 lb 9.6 oz)  02/24/15 65.772 kg (145 lb)     Intake/Output Summary (Last 24 hours) at 01/04/16 0945 Last data filed at 01/03/16 2150  Gross per 24 hour  Intake    243 ml  Output      0 ml  Net    243 ml     Physical Exam  Awake Alert, Oriented X 3, No new F.N deficits, Normal affect [place].AT,PERRAL Supple Neck,No JVD, No cervical lymphadenopathy appriciated.  Symmetrical Chest wall movement, Mod air movement bilaterally, CTAB RRR,No Gallops,Rubs or new Murmurs, No Parasternal Heave +ve B.Sounds, Abd Soft, No tenderness, No organomegaly appriciated, No rebound - guarding or rigidity. No Cyanosis, Clubbing or edema, No new Rash or bruise       Data Review:   Micro Results Recent Results (from the past 240 hour(s))  MRSA PCR Screening     Status: None   Collection Time: 12/27/15 12:52 AM  Result Value Ref Range Status   MRSA by PCR NEGATIVE NEGATIVE Final    Comment:        The GeneXpert MRSA Assay (FDA approved for NASAL specimens only), is one component of a comprehensive MRSA colonization surveillance program. It is not intended to diagnose MRSA infection nor to guide or monitor treatment for MRSA infections.   Culture, respiratory (NON-Expectorated)     Status: None   Collection Time: 12/27/15  2:12 PM  Result Value Ref Range Status   Specimen Description TRACHEAL ASPIRATE  Final   Special Requests NONE  Final   Gram Stain   Final    FEW WBC PRESENT,BOTH PMN AND MONONUCLEAR NO SQUAMOUS EPITHELIAL CELLS SEEN NO ORGANISMS SEEN Performed at [facility]    Culture   Final    NORMAL OROPHARYNGEAL FLORA Performed at [facility]    Report Status  12/29/2015 FINAL  Final  Respiratory virus panel     Status: None   Collection Time: 12/27/15  5:27 PM  Result Value Ref Range Status   Source - RVPAN NASAL SWAB  Corrected   Respiratory Syncytial Virus A Negative Negative Final   Respiratory Syncytial Virus B Negative Negative Final   Influenza A Negative Negative Final   Influenza B Negative Negative Final  Parainfluenza 1 Negative Negative Final   Parainfluenza 2 Negative Negative Final   Parainfluenza 3 Negative Negative Final   Metapneumovirus Negative Negative Final   Rhinovirus Negative Negative Final   Adenovirus Negative Negative Final    Comment: (NOTE) Performed At: [facility] [address] [place], [place] [id number] Mila Homer MD WU:[id number]     Radiology Reports Dg Chest 2 View  12/26/2015  CLINICAL DATA:  Shortness of breath yesterday, progressive today. Occasional cough. EXAM: CHEST  2 VIEW COMPARISON:  Most recent chest radiograph 05/27/2009 FINDINGS: Advanced emphysematous change with hyperinflation. Diffuse increased interstitial markings may reflect superimposed pulmonary edema. Blunting of the costophrenic angles, right greater than left, pleural effusions versus hyperinflation. Patchy airspace disease in the posterior right upper lobe. Heart is normal in size, mediastinal contours are normal. No pneumothorax. Remote bilateral rib fractures. IMPRESSION: 1. Patchy right upper lobe opacity, concerning for pneumonia. Followup PA and lateral chest X-ray is recommended in 3-4 weeks following trial of antibiotic therapy to ensure resolution and exclude underlying malignancy. 2. Advanced emphysema. Increased interstitial markings from prior exam may reflect superimposed edema. Small pleural effusions versus hyperinflation. Question degree of volume overload/CHF, with concurrent pneumonia, all superimposed on chronic lung disease. Electronically Signed   By: Rubye Oaks M.D.   On: 12/26/2015 18:11   Ct  Angio Chest Pe W/cm &/or Wo Cm  12/26/2015  CLINICAL DATA:  65 year old male with shortness of breath copy, tachycardia. EXAM: CT ANGIOGRAPHY CHEST WITH CONTRAST TECHNIQUE: Multidetector CT imaging of the chest was performed using the standard protocol during bolus administration of intravenous contrast. Multiplanar CT image reconstructions and MIPs were obtained to evaluate the vascular anatomy. CONTRAST:  80mL OMNIPAQUE IOHEXOL 350 MG/ML SOLN COMPARISON:  Radiograph dated 12/26/2015 FINDINGS: There is emphysematous changes of the lungs. Patchy area of consolidative changes at the right lung base as well as area of interstitial thickening in the right apical region are concerning for pneumonia. There is a small right pleural effusion. No pneumothorax. The central airways are patent. There is mild bronchiectatic changes with mild thickening of the bronchial wall. There is diffuse dilatation of the ascending aorta with involvement of the proximal arch measuring up to 5.2 cm in diameter. Evaluation of the aorta is limited as timing of the contrast was designed for optimal opacification of the pulmonary arteries. No definite dissection identified. There is no periaortic fluid. There is atherosclerotic calcification of thoracic aorta. No CT evidence of pulmonary embolism. There is no cardiomegaly or significant pericardial effusion. There is coronary vascular calcification. There is calcification of the aortic valves. There is thickened appearance of the left ventricular myocardium, likely myocardial hypertrophy. Mild dilatation of the left atrium. Correlation with echocardiogram recommended. There is retrograde flow of contrast from the right atrium into the IVC compatible with a degree of right cardiac dysfunction. Right hilar and subcarinal adenopathy. The esophagus and the thyroid gland are grossly unremarkable. There is no axillary adenopathy. The chest wall soft tissues appear unremarkable. Old healed bilateral  rib fractures. No acute fracture. The visualized upper abdomen appear grossly unremarkable. Review of the MIP images confirms the above findings. IMPRESSION: No CT evidence of pulmonary embolism. Emphysema with an area of consolidative change in the right lung base compatible with pneumonia. Small right pleural effusion. Diffuse dilatation of the ascending aorta and proximal arch measuring up to 5.2 cm. Evaluation of the aorta is limited on this study as timing of the contrast was designed to opacifying the pulmonary vasculature. No  definite dissection identified. CT angiography with optimal opacification of the aorta may provide additional evaluation. Ascending thoracic aortic aneurysm. Recommend semi-annual imaging followup by CTA or MRA and referral to cardiothoracic surgery if not already obtained. This recommendation follows 2010 ACCF/AHA/AATS/ACR/ASA/SCA/SCAI/SIR/STS/SVM Guidelines for the Diagnosis and Management of Patients With Thoracic Aortic Disease. Circulation. 2010; 121: Z610-R604 Calcification of the aortic valves with probable resulting aortic valve stenosis. Dilatation of the ascending aorta may represent post stenotic aneurysm. There is also left ventricular hypertrophy and mild dilatation of the left atrium. Echocardiogram is recommended for further evaluation. Electronically Signed   By: Elgie Collard M.D.   On: 12/26/2015 21:19   Dg Chest Port 1 View  12/28/2015  CLINICAL DATA:  Acute respiratory failure. The patient extubated himself yesterday. EXAM: PORTABLE CHEST 1 VIEW COMPARISON:  Yesterday. FINDINGS: The cardiac silhouette remains borderline enlarged. Decreased bilateral airspace opacity. Stable bilateral pleural effusions and prominence of the pulmonary vasculature and interstitial markings. No acute bony abnormality. IMPRESSION: 1. Improving congestive heart failure superimposed on COPD. 2. Persistent probable atelectasis at the right lung base and mild left basilar atelectasis.  Electronically Signed   By: Beckie Salts M.D.   On: 12/28/2015 07:14   Dg Chest Port 1 View  12/27/2015  CLINICAL DATA:  Intubated. EXAM: PORTABLE CHEST 1 VIEW COMPARISON:  Yesterday. FINDINGS: Borderline enlarged cardiac silhouette with a mild increase in size. Increased airspace opacity throughout the right lung and the majority of the left lung. Increased size of small bilateral pleural effusions. Endotracheal tube in satisfactory position. Nasogastric tube tip in the mid stomach. Diffuse osteopenia. IMPRESSION: Worsening changes of congestive heart failure superimposed on COPD. Underlying pneumonia cannot be excluded. Electronically Signed   By: Beckie Salts M.D.   On: 12/27/2015 13:42   Dg Abd Portable 1v  12/27/2015  CLINICAL DATA:  Encounter for orogastric tube placement. EXAM: PORTABLE ABDOMEN - 1 VIEW COMPARISON:  None. FINDINGS: Oral/nasogastric tube passes below the diaphragm extending well into the stomach. Tip lies in the mid to distal stomach. Normal bowel gas pattern. IMPRESSION: Oral/nasogastric tube is well positioned extending well into the stomach. Electronically Signed   By: Amie Portland M.D.   On: 12/27/2015 13:44     CBC  Recent Labs Lab 12/30/15 0340 12/31/15 0142 01/01/16 0330 01/03/16 0414 01/04/16 0525  WBC 11.9* 10.6* 9.3 9.6 10.8*  HGB 12.6* 12.4* 11.4* 12.2* 13.0  HCT 38.3* 37.6* 34.5* 38.0* 39.4  PLT 293 306 294 315 315  MCV 97.5 97.7 100.0 98.2 97.5  MCH 32.1 32.2 33.0 31.5 32.2  MCHC 32.9 33.0 33.0 32.1 33.0  RDW 13.2 13.2 12.9 12.7 12.8    Chemistries   Recent Labs Lab 12/28/15 1532  12/30/15 0340 12/31/15 0142 01/01/16 0330 01/02/16 1242 01/03/16 0414  NA  --   < > 139 139 136 138 138  K  --   < > 5.0 5.2* 5.6* 4.5 4.4  CL  --   < > 97* 99* 99* 100* 101  CO2  --   < > GLUCOSE  --   < > 148* 138* 136* 114* 105*  BUN  --   < > 31* 37* 33* 27* 21*  CREATININE  --   < > 1.28* 1.12 0.96 0.88 1.04  CALCIUM  --   < > 9.3 9.1 8.9  9.0 8.6*  MG 2.2  --   --   --   --   --  2.1  AST  --   --   --   --  36  --   --   ALT  --   --   --   --  31  --   --   ALKPHOS  --   --   --   --  50  --   --   BILITOT  --   --   --   --  0.2*  --   --   < > = values in this interval not displayed. ------------------------------------------------------------------------------------------------------------------ No results for input(s): CHOL, HDL, LDLCALC, TRIG, CHOLHDL, LDLDIRECT in the last 72 hours.  Lab Results  Component Value Date   HGBA1C  05/28/2009    5.7 (NOTE) The ADA recommends the following therapeutic goal for glycemic control related to Hgb A1c measurement: Goal of therapy: <6.5 Hgb A1c  Reference: American Diabetes Association: Clinical Practice Recommendations 2010, Diabetes Care, 2010, 33: (Suppl  1).   ------------------------------------------------------------------------------------------------------------------  Recent Labs  01/04/16 0525  TSH 2.395   ------------------------------------------------------------------------------------------------------------------ No results for input(s): VITAMINB12, FOLATE, FERRITIN, TIBC, IRON, RETICCTPCT in the last 72 hours.  Coagulation profile  Recent Labs Lab 12/29/15 0520 01/03/16 0414  INR 1.15 1.10    No results for input(s): DDIMER in the last 72 hours.  Cardiac Enzymes No results for input(s): CKMB, TROPONINI, MYOGLOBIN in the last 168 hours.  Invalid input(s): CK ------------------------------------------------------------------------------------------------------------------    Component Value Date/Time   BNP 1306.4* 12/27/2015 1703    Time Spent in minutes  35   SINGH,PRASHANT K M.D on 01/04/2016 at 9:45 AM  Between 7am to 7pm - Pager - [phone]  After 7pm go to [email or web address] - password [facility]  [facility] -  Office  [phone]

## 2016-01-04 NOTE — Progress Notes (Signed)
ANTICOAGULATION CONSULT NOTE - Follow Up Consult  Pharmacy Consult for Lovenox Indication: 3V CAD awaiting CABG/AVR  No Known Allergies  Patient Measurements: Height: 5\' 9"  (175.3 cm) Weight: 149 lb 7.6 oz (67.8 kg) IBW/kg (Calculated) : 70.7  Vital Signs: Temp: 97.5 F (36.4 C) (02/27 0608) Temp Source: Oral (02/27 0608) BP: 101/78 mmHg (02/27 0608) Pulse Rate: 61 (02/27 0608)  Labs:  Recent Labs  01/02/16 1242 01/03/16 0414 01/04/16 0525  HGB  --  12.2* 13.0  HCT  --  38.0* 39.4  PLT  --  315 315  LABPROT  --  14.4  --   INR  --  1.10  --   CREATININE 0.88 1.04  --     Estimated Creatinine Clearance: 69.7 mL/min (by C-G formula based on Cr of 1.04).   Medications:  Lovenox  sq q12  Assessment: 64yof s/p cath on 2/21 found to have 3v CAD. Heparin resumed after cath. She was transitioned from heparin to lovenox on 2/24. She continues on full dose lovenox awaiting CABG/AVR on Thursday. Renal function trending up slightly but dose remains appropriate. CBC stable. No bleeding reported.  Goal of Therapy:  Anti-Xa level 0.6-1 units/ml 4hrs after LMWH dose given Monitor platelets by anticoagulation protocol: Yes   Plan:  1) Continue lovenox  sq q12  Fredrik Rigge 01/04/2016,9:03 AM

## 2016-01-05 ENCOUNTER — Encounter (HOSPITAL_COMMUNITY)
Admission: EM | Disposition: A | Discharge status: Rehabilitation Facility | Payer: Self-pay | Source: Home / Self Care | Attending: Surgery

## 2016-01-05 DIAGNOSIS — I350 Nonrheumatic aortic (valve) stenosis: Secondary | ICD-10-CM

## 2016-01-05 DIAGNOSIS — I2510 Atherosclerotic heart disease of native coronary artery without angina pectoris: Secondary | ICD-10-CM

## 2016-01-05 DIAGNOSIS — J449 Chronic obstructive pulmonary disease, unspecified: Secondary | ICD-10-CM

## 2016-01-05 DIAGNOSIS — I2584 Coronary atherosclerosis due to calcified coronary lesion: Secondary | ICD-10-CM

## 2016-01-05 SURGERY — CORONARY ARTERY BYPASS GRAFTING (CABG)
Anesthesia: General | Site: Chest

## 2016-01-05 NOTE — Progress Notes (Signed)
7 Days Post-Op Procedure(s) (LRB): Right/Left Heart Cath and Coronary Angiography (N/A) Subjective: No complaints. Ambulated well today without shortness of breath or chest pain.  Objective: Vital signs in last 24 hours: Temp:  [97.3 F (36.3 C)-98.1 F (36.7 C)] 98.1 F (36.7 C) (02/28 1156) Pulse Rate:  [64-79] 64 (02/28 1156) Cardiac Rhythm:  [-] Normal sinus rhythm (02/28 0705) Resp:  [17-18] 17 (02/28 1156) BP: (105-108)/(69-78) 108/69 mmHg (02/28 1156) SpO2:  [99 %-100 %] 100 % (02/28 1156)  Hemodynamic parameters for last 24 hours:    Intake/Output from previous day: 02/27 0701 - 02/28 0700 In: 720 [P.O.:720] Out: -  Intake/Output this shift:    General appearance: alert and cooperative Heart: regular rate and rhythm and 2/6 systolic murmur RUSB Lungs: clear to auscultation bilaterally  Lab Results:  Recent Labs  01/03/16 0414 01/04/16 0525  WBC 9.6 10.8*  HGB 12.2* 13.0  HCT 38.0* 39.4  PLT 315 315   BMET:  Recent Labs  01/03/16 0414  NA 138  K 4.4  CL 101  CO2 31  GLUCOSE 105*  BUN 21*  CREATININE 1.04  CALCIUM 8.6*    PT/INR:  Recent Labs  01/03/16 0414  LABPROT 14.4  INR 1.10   ABG    Component Value Date/Time   PHART 7.409 12/29/2015 1407   HCO3 25.1* 12/29/2015 1415   TCO2 26 12/29/2015 1415   ACIDBASEDEF 1.0 12/29/2015 1415   O2SAT 57.0 12/29/2015 1415   CBG (last 3)  No results for input(s): GLUCAP in the last 72 hours.  Assessment/Plan:  Severe multi-vessel coronary disease with moderate LV dysfunction and LVEF of 35-40%.  Severe AS  Aortic root, ascending and arch aneurysm.  Right lung pneumonia and respiratory failure which could have been precipitated by heart failure in this gentleman with a long prior smoking history and COPD with chronic exertional dyspnea. His PFT's look pretty bad with FEV1 of 1.20 and DLCO 28% predicted, no overinflation. I discussed the PFT's with Dr. Vassie Loll and he does not feel the surgical  risk is prohibitive. I agree but his surgical risk for pulmonary complications is certainly increased. I discussed this with the patient and he fully understands and agrees to proceed with surgical therapy. Will plan to do Thursday am.   LOS: 10 days    Alleen Borne 01/05/2016

## 2016-01-05 NOTE — Progress Notes (Signed)
Pt ambulating independently in hall with O2 on 2L.  No assistive devices in use.

## 2016-01-05 NOTE — Progress Notes (Signed)
Patient Name: Baron Parmelee Upham Date of Encounter: 01/05/2016  Primary Cardiologist: Dr. Tresa Endo   Principal Problem:   Acute respiratory failure with hypoxia [facility]) Active Problems:   CAP (community acquired pneumonia)   NSTEMI (non-ST elevated myocardial infarction) (HCC)   COPD (chronic obstructive pulmonary disease) (HCC)   Hypoxia   Elevated troponin   History of hypertension   History of hyperlipidemia   Ascending aortic aneurysm (HCC)   Aortic stenosis   Acute respiratory failure (HCC)   CAD (coronary artery disease)   Coronary artery disease involving native coronary artery of native heart with unstable angina pectoris (HCC)   Severe aortic stenosis    SUBJECTIVE  Denies any CP or SOB.   CURRENT MEDS . [START ON 01/08/2016] amiodarone  200 mg Oral Daily  . amiodarone  400 mg Oral 3 times per day  . antiseptic oral rinse  7 mL Mouth Rinse BID  . aspirin EC  81 mg Oral Daily  . atorvastatin  80 mg Oral q1800  . enoxaparin (LOVENOX) injection  1 mg/kg Subcutaneous Q12H  . lisinopril  2.5 mg Oral Daily  . metoprolol tartrate  25 mg Oral BID  . mometasone-formoterol  2 puff Inhalation BID  . predniSONE  40 mg Oral Q breakfast  . sodium chloride flush  3 mL Intravenous Q12H  . tiotropium  18 mcg Inhalation Daily    OBJECTIVE  Filed Vitals:   01/04/16 1220 01/04/16 2105 01/05/16 0346 01/05/16 1156  BP: 111/85 105/78 108/77 108/69  Pulse: 70 79 72 64  Temp: 99.1 F (37.3 C) 97.4 F (36.3 C) 97.3 F (36.3 C) 98.1 F (36.7 C)  TempSrc: Oral Oral Oral Oral  Resp: 19 18 18 17   Height:      Weight:      SpO2: 100% 99% 100% 100%    Intake/Output Summary (Last 24 hours) at 01/05/16 1346 Last data filed at 01/05/16 1300  Gross per 24 hour  Intake    840 ml  Output      0 ml  Net    840 ml   Filed Weights   12/27/15 0058 12/29/15 0706  Weight: 144 lb 13.5 oz (65.7 kg) 149 lb 7.6 oz (67.8 kg)    PHYSICAL EXAM  General: Pleasant, NAD. Neuro: Alert and  oriented X 3. Moves all extremities spontaneously. Psych: Normal affect. HEENT:  Normal  Neck: Supple without bruits or JVD. Lungs:  Resp regular and unlabored. CTA Heart: RRR no s3, s4. 2/6 systolic murmur at RUSB Abdomen: Soft, non-tender, non-distended, BS + x 4.  Extremities: No clubbing, cyanosis or edema. DP/PT/Radials 2+ and equal bilaterally.  Accessory Clinical Findings  CBC  Recent Labs  01/03/16 0414 01/04/16 0525  WBC 9.6 10.8*  HGB 12.2* 13.0  HCT 38.0* 39.4  MCV 98.2 97.5  PLT 315 315   Basic Metabolic Panel  Recent Labs  01/03/16 0414  NA 138  K 4.4  CL 101  CO2 31  GLUCOSE 105*  BUN 21*  CREATININE 1.04  CALCIUM 8.6*  MG 2.1   Thyroid Function Tests  Recent Labs  01/04/16 0525  TSH 2.395    TELE NSR with frequent PVCs, 1 episode of irregular rhythm but still sinus in nature, not afib    ECG  No new EKG  Echocardiogram 12/28/2015  LV EF: 35% -  40%  ------------------------------------------------------------------- Indications:   Chest pain 786.51.  ------------------------------------------------------------------- History:  PMH: Elevated troponin. Chronic obstructive pulmonary disease. PMH:  Myocardial infarction.  Risk factors: Hypertension. Dyslipidemia.  ------------------------------------------------------------------- Study Conclusions  - Left ventricle: The cavity size was normal. There was moderate concentric hypertrophy. Systolic function was moderately reduced. The estimated ejection fraction was in the range of 35% to 40%. There is akinesis of the mid-apicalinferolateral myocardium. There is akinesis of the basal-midinferoseptal myocardium. Doppler parameters are consistent with high ventricular filling pressure. - Aortic valve: The AV is heavily calcified and the right coronary cusp is fixed and immobile. The mean gradient and peak velocity are consistent with moderate AS but this is  most likely underestimated but LV dysfunction with reduced Cardiac output The calculated AVA is consistent with severe AS and visually the valve mobility is severely reduced. Valve area (VTI): 0.91 cm^2. Valve area (Vmax): 0.82 cm^2. Valve area (Vmean): 0.87 cm^2. - Aorta: Aortic root dimension: 42 mm (ED). - Mitral valve: Calcified annulus. There was mild regurgitation. - Left atrium: The atrium was mildly dilated. - Right ventricle: The RV apex appears akinetic.    Radiology/Studies  Dg Chest 2 View  12/26/2015  CLINICAL DATA:  Shortness of breath yesterday, progressive today. Occasional cough. EXAM: CHEST  2 VIEW COMPARISON:  Most recent chest radiograph 05/27/2009 FINDINGS: Advanced emphysematous change with hyperinflation. Diffuse increased interstitial markings may reflect superimposed pulmonary edema. Blunting of the costophrenic angles, right greater than left, pleural effusions versus hyperinflation. Patchy airspace disease in the posterior right upper lobe. Heart is normal in size, mediastinal contours are normal. No pneumothorax. Remote bilateral rib fractures. IMPRESSION: 1. Patchy right upper lobe opacity, concerning for pneumonia. Followup PA and lateral chest X-ray is recommended in 3-4 weeks following trial of antibiotic therapy to ensure resolution and exclude underlying malignancy. 2. Advanced emphysema. Increased interstitial markings from prior exam may reflect superimposed edema. Small pleural effusions versus hyperinflation. Question degree of volume overload/CHF, with concurrent pneumonia, all superimposed on chronic lung disease. Electronically Signed   By: Rubye Oaks M.D.   On: 12/26/2015 18:11   Ct Angio Chest Pe W/cm &/or Wo Cm  12/26/2015  CLINICAL DATA:  65 year old male with shortness of breath copy, tachycardia. EXAM: CT ANGIOGRAPHY CHEST WITH CONTRAST TECHNIQUE: Multidetector CT imaging of the chest was performed using the standard protocol during bolus  administration of intravenous contrast. Multiplanar CT image reconstructions and MIPs were obtained to evaluate the vascular anatomy. CONTRAST:  80mL OMNIPAQUE IOHEXOL 350 MG/ML SOLN COMPARISON:  Radiograph dated 12/26/2015 FINDINGS: There is emphysematous changes of the lungs. Patchy area of consolidative changes at the right lung base as well as area of interstitial thickening in the right apical region are concerning for pneumonia. There is a small right pleural effusion. No pneumothorax. The central airways are patent. There is mild bronchiectatic changes with mild thickening of the bronchial wall. There is diffuse dilatation of the ascending aorta with involvement of the proximal arch measuring up to 5.2 cm in diameter. Evaluation of the aorta is limited as timing of the contrast was designed for optimal opacification of the pulmonary arteries. No definite dissection identified. There is no periaortic fluid. There is atherosclerotic calcification of thoracic aorta. No CT evidence of pulmonary embolism. There is no cardiomegaly or significant pericardial effusion. There is coronary vascular calcification. There is calcification of the aortic valves. There is thickened appearance of the left ventricular myocardium, likely myocardial hypertrophy. Mild dilatation of the left atrium. Correlation with echocardiogram recommended. There is retrograde flow of contrast from the right atrium into the IVC compatible with a degree of right cardiac dysfunction. Right hilar and subcarinal  adenopathy. The esophagus and the thyroid gland are grossly unremarkable. There is no axillary adenopathy. The chest wall soft tissues appear unremarkable. Old healed bilateral rib fractures. No acute fracture. The visualized upper abdomen appear grossly unremarkable. Review of the MIP images confirms the above findings. IMPRESSION: No CT evidence of pulmonary embolism. Emphysema with an area of consolidative change in the right lung base  compatible with pneumonia. Small right pleural effusion. Diffuse dilatation of the ascending aorta and proximal arch measuring up to 5.2 cm. Evaluation of the aorta is limited on this study as timing of the contrast was designed to opacifying the pulmonary vasculature. No definite dissection identified. CT angiography with optimal opacification of the aorta may provide additional evaluation. Ascending thoracic aortic aneurysm. Recommend semi-annual imaging followup by CTA or MRA and referral to cardiothoracic surgery if not already obtained. This recommendation follows 2010 ACCF/AHA/AATS/ACR/ASA/SCA/SCAI/SIR/STS/SVM Guidelines for the Diagnosis and Management of Patients With Thoracic Aortic Disease. Circulation. 2010; 121: A540-J811 Calcification of the aortic valves with probable resulting aortic valve stenosis. Dilatation of the ascending aorta may represent post stenotic aneurysm. There is also left ventricular hypertrophy and mild dilatation of the left atrium. Echocardiogram is recommended for further evaluation. Electronically Signed   By: Elgie Collard M.D.   On: 12/26/2015 21:19   Dg Chest Port 1 View  12/28/2015  CLINICAL DATA:  Acute respiratory failure. The patient extubated himself yesterday. EXAM: PORTABLE CHEST 1 VIEW COMPARISON:  Yesterday. FINDINGS: The cardiac silhouette remains borderline enlarged. Decreased bilateral airspace opacity. Stable bilateral pleural effusions and prominence of the pulmonary vasculature and interstitial markings. No acute bony abnormality. IMPRESSION: 1. Improving congestive heart failure superimposed on COPD. 2. Persistent probable atelectasis at the right lung base and mild left basilar atelectasis. Electronically Signed   By: Beckie Salts M.D.   On: 12/28/2015 07:14   Dg Chest Port 1 View  12/27/2015  CLINICAL DATA:  Intubated. EXAM: PORTABLE CHEST 1 VIEW COMPARISON:  Yesterday. FINDINGS: Borderline enlarged cardiac silhouette with a mild increase in size.  Increased airspace opacity throughout the right lung and the majority of the left lung. Increased size of small bilateral pleural effusions. Endotracheal tube in satisfactory position. Nasogastric tube tip in the mid stomach. Diffuse osteopenia. IMPRESSION: Worsening changes of congestive heart failure superimposed on COPD. Underlying pneumonia cannot be excluded. Electronically Signed   By: Beckie Salts M.D.   On: 12/27/2015 13:42   Dg Abd Portable 1v  12/27/2015  CLINICAL DATA:  Encounter for orogastric tube placement. EXAM: PORTABLE ABDOMEN - 1 VIEW COMPARISON:  None. FINDINGS: Oral/nasogastric tube passes below the diaphragm extending well into the stomach. Tip lies in the mid to distal stomach. Normal bowel gas pattern. IMPRESSION: Oral/nasogastric tube is well positioned extending well into the stomach. Electronically Signed   By: Amie Portland M.D.   On: 12/27/2015 13:44    ASSESSMENT AND PLAN  65 yo M w a h/o COPD, HTN (by record, though normotensive here, not on treatment), HLD (by record, though lipid panel unremarkable here, not on treatment), & heavy prior alcohol (12 pack per day x 30 years, quit 2004), & tobacco use (~150 year pack history, quit 2010) p/w 1 day of diffuse chest discomfort in the context of 6 days of fevers, coughing productive of "milky phlegm" & dyspnea, found to be hypoxic to 91% in the ED due to pneumonia. Found to have dilated ascending aortic aneurysm and severe AS. Intubated on 2/19, self extubated on 12/28/2015. Echo 2/20 showed EF 35-40%, with  wall motion abnormality, severe AS, mild MR, 42mm aortic root, akinetic RV apex. Cath 2/21 showed 3v CAD with 50 ost LM. Plan for CABG/AVR/Aortic root replacement at the same time when R PNA resolve and stable from pulm perspective. He has been having some PAF as well, amiodarone started.   1. PAF  - started on amiodarone PO  TID for 5 days, then transition to  daily. May use IV amiodarone during perioperative  phase  - continue lovenox, CHA2DS2-Vasc score 2 (CAD, HTN)  - continue ASA, BB, ACEI, lipitor. Would not plan amiodarone long term, hopefully can d/c 1-2 month after discharge if no recurrence  2. NSTEMI with 3v CAD with 50% ost LM  - cath 12/29/2015 50% ost LM, diffuse 50% prox to mid LAD, 70% distal LAD, 70% prox LCx, 90% prox RCA  - seen by CT surgery, plan for CABG/aortic root replacement/AVR this Thursday  - plavix held, last dose 2/21. PFT today showed severe obstructive disease. Per CT surgery, high risk, pulmonology agree but it is not prohibitive to surgery. Pending further decision by CT surgery.  3. Severe AS  4. Dilated aortic root  5. 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 Agree with assessment and plan.  Appreciate Dr. Reginia Naas assessment of lung function which is poor, but not prohibitive of cardiac surgery.  Patient was able to walk the halls today without chest pain or shortness of breath.  Rhythm stable.  Plan CABG, AVR, and replacement of aortic root, ascending aorta and aortic arch with increased risk of prolonged ventilation.  TAVR is not an option with his 5.2 cm aortic aneurysm and multivessel coronary obstructive disease.    Lennette Bihari, MD, [facility] 01/05/2016 3:48 PM

## 2016-01-05 NOTE — Progress Notes (Signed)
PULMONARY / CRITICAL CARE MEDICINE   Name: Craig [name] MRN: [id number] DOB: May 16, 1951    ADMISSION DATE:  12/26/2015 CONSULTATION DATE:  12/27/15  REFERRING MD:  Dr. David Stall  CHIEF COMPLAINT:  Acute Hypoxic Respiratory Failure  HISTORY OF PRESENT ILLNESS:   65 y/o M with PMH of emphysema / COPD admitted 2/18 with increased SOB, chest tightness. Found to have RLL PNA. Flu negative. Elevated troponin in setting of demand ischemia. Decompensated on 2/19 requiring brief intubation for acute pulmonary edema Echo showed severe AS with EF of 35% Cath showed 3 vessel CAD   SUBJECTIVE:  No CP, dulera worked well Up in chair on 2L [place]    VITAL SIGNS: BP 108/77 mmHg  Pulse 72  Temp(Src) 97.3 F (36.3 C) (Oral)  Resp 18  Ht  (1.753 m)  Wt 149 lb 7.6 oz (67.8 kg)  BMI 22.06 kg/m2  SpO2 100%  HEMODYNAMICS:    VENTILATOR SETTINGS:    INTAKE / OUTPUT: I/O last 3 completed shifts: In: 960 [P.O.:960] Out: -   PHYSICAL EXAMINATION: General:  Thin adult male Neuro:  AAOx3, interactive, in good spirits HEENT:  MM, NCAT.  Cardiovascular:  s1s2 rrr, difficult to hear murmur, no carotid radiation  Lungs:  Less labored , lungs decreased bilaterally at the bases.  Abdomen:  Soft, bsx4 active Musculoskeletal:  No acute deformities  Skin: no edema, rashes or lesions  LABS:  BMET  Recent Labs Lab 01/01/16 0330 01/02/16 1242 01/03/16 0414  NA 136 138 138  K 5.6* 4.5 4.4  CL 99* 100* 101  CO2 BUN 33* 27* 21*  CREATININE 0.96 0.88 1.04  GLUCOSE 136* 114* 105*    Electrolytes  Recent Labs Lab 01/01/16 0330 01/02/16 1242 01/03/16 0414  CALCIUM 8.9 9.0 8.6*  MG  --   --  2.1    CBC  Recent Labs Lab 01/01/16 0330 01/03/16 0414 01/04/16 0525  WBC 9.3 9.6 10.8*  HGB 11.4* 12.2* 13.0  HCT 34.5* 38.0* 39.4  PLT 294 315 315    Coag's  Recent Labs Lab 01/03/16 0414  INR 1.10    Sepsis Markers No results for input(s):  LATICACIDVEN, PROCALCITON, O2SATVEN in the last 168 hours.  ABG  Recent Labs Lab 12/29/15 1407  PHART 7.409  PCO2ART 36.8  PO2ART 58.0*    Liver Enzymes  Recent Labs Lab 01/01/16 0330  AST 36  ALT 31  ALKPHOS 50  BILITOT 0.2*  ALBUMIN 2.9*    Cardiac Enzymes No results for input(s): TROPONINI, PROBNP in the last 168 hours.  Glucose No results for input(s): GLUCAP in the last 168 hours.  Imaging No results found.   STUDIES:  2/18  CTA Chest >> negative for PE, emphysema, RUL consolidation, small R pleural effusion, dilation of ascending aortia, proximal arch measuring up to 5.2 cm without dissection, and ascending thoracic aortic aneurysm.  2/20 echo >> sever AS, EF 35% 2/21 cath 2/27 PFTs show severe airway obstruction with FEV1 of 1.28, 35%, DLCO of 26%, no hyperinflation  CULTURES: Tracheal Aspirate 2/19 >> ng Flu 2/18 >> negative   ANTIBIOTICS: Rocephin 2/18 >> 2/27 Azithromycin 2/18 >> off Tamiflu 2/18 >> 2/21  SIGNIFICANT EVENTS: 2/18  Admit with SOB, RUL PNA.  PE ruled out.  Elevated trop leak thought demand ischemia.   2/19  Decompensated requiring intubation due to resp distress  2/19 self extubated  LINES/TUBES: ETT 2/19 >> 2/19  DISCUSSION: 65 y/o M with PMH of emphysema /  COPD admitted 2/18 with increased SOB, chest tightness.  Found to have RLL PNA.  Flu negative. Elevated troponin in setting of demand ischemia.  Decompensated on 2/19 requiring brief  intubation.    ASSESSMENT / PLAN:  PULMONARY A: Acute Hypoxic Respiratory Failure - in the setting of RLL PNA, Severe Emphysema with acute exacerbation, possible component of pulmonary edema. Flu negative PCR.  Acute Exacerbation of COPD  Hx Tobacco Abuse  Pneumonia P:   Duoneb Q6 with Q3 PRN albuterol  Started on spiriva, dulera added Pred 40 - solumedrol 40 on day of surgery    CARDIOVASCULAR A:  NSTEMI Severe Aortic Stenosis - not a candidate for TAVR Aortic Root dilation 3V-  CAD Frequent PVC's  P:  Appreciate Cards, CT surg.   Tolerating metoprolol and low-dose lisinopril Continue lipitor, ASA  His lung function is poor but not prohibitive of cardiac surgery. He is obviously high risk due to long duration of surgery. He understands risk of prolonged mechanical ventilation. His deterioration on 2/19 was clearly due to pulmonary edema  Cyril Mourning MD. [facility]. [facility] Pager [phone] If no response call [phone]

## 2016-01-05 NOTE — Progress Notes (Signed)
Patient Demographics:    Craig [name], is a 65 y.o. male, DOB - [birth year], AVW:[id number]  Admit date - 12/26/2015   Admitting Physician Craig Parker, MD  Outpatient Primary MD for the patient is EDENFIELD, Craig Miners, PA-C  LOS - 10  Summary  65 y/o M with PMH of HTN, HLD, ETOH abuse, tobacco abuse and COPD who presented to [facility] on 12/26/15 with a 24 hour history of worsening shortness of breath, cough, chest tightness, fevers/chilld, and myalgias, RA saturation of 91%. CTA of the chest which was negative for PE but demonstrated emphysema, RUL consolidation, small R pleural effusion, dilation of ascending aortia, proximal arch measuring up to 5.2 cm without dissection, and ascending thoracic aortic aneurysm.   The patient was admitted per [facility] for community acquired PNA. He was treated with IV rocephin / azithromycin. Due to elevated troponin, Cardiology was consulted for evaluation. He was placed on a heparin gtt, nitro gtt, plavix, asa, and lopressor. Cardiology evaluation felt elevated troponin likely due to demand ischemia in the setting of PNA with underlying severe emphysema.   On 2/19, the patient had progressive respiratory distress and hypoxemia.Intubated and transferred to ICU. ICU patient self extubated herself, she was seen by cardiology, she also underwent left heart cath which confirmed triple vessel disease requiring CABG, echo revealed severe AS. Cardiothoracic surgery is following and plan is for open heart surgery on March 1st after cardiothoracic team discusses the case with cardiology and pulmonary.   Chief Complaint  Patient presents with  . Shortness of Breath  . Cough        Subjective:    Craig [name] today has, No headache, No chest pain, No abdominal pain - No  Nausea, No new weakness tingling or numbness, No Cough - SOB.     Assessment  & Plan :     1. Acute hypoxic and hypercapnic respiratory failure due to COPD exacerbation and right lower lobe community-acquired pneumonia and acute on chronic systolic heart failure. Influenza negative, CT angiogram negative for PE, briefly intubated and treated in ICU. Now on nasal cannula oxygen much improvement, has finished antibiotics, continue supportive care. Pulmonary recalled in anticipation of open heart surgery, PFTs show severe COPD.  2. 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 Compensated from a CHF standpoint, on beta blocker and low-dose ACE inhibitor if blood pressure tolerates. Cardiology following along with cardiothoracic surgery.  3. Paroxysmal atrial fibrillation with runs of SVT. [place] vasc 2 score of greater than 3. Currently on full dose Lovenox, amiodarone and beta blocker. Cardiology following.  4. Non-STEMI with severe aortic stenosis. Left heart catheterization showing triple-vessel disease, due for CABG, currently on aspirin, full dose Lovenox, beta blocker and statin for secondary prevention. Plavix on hold currently. Chest pain-free. Awaiting CABG, cardiothoracic surgery on board. If perioperative risk is too high from pulmonary standpoint then patient might require TAVR.  5. Dyslipidemia. On statin continue.  6. Ascending aortic aneurysm noted on CT angiogram. Cardio thoracic surgery already following.  7. Carotid artery stenosis more than 50% on the left side. Continue secondary prevention with antiplatelet therapy and statin, outpatient vascular surgery  follow-up post discharge.    Code Status : Full  Family Communication  : None present  Disposition Plan  : Stay inpatient on telemetry  Consults  :  Cards, Cardiothoracic  surgery, pulmonary,   Procedures  :   PFTS   Interpretation: The FEV1, FEV1/FVC ratio and FEF25-75% are reduced indicating airway obstruction. It is impossible to adequately evaluate the flow volume loop due to excessive variablity such as might be produced by coughing. The increased airway resistance and decreased specific conductance indicate a central airway disease. Lung volumes are within normal limits. Following administration of bronchodilators, there is no significant response. The reduced diffusing capacity indicates a severe loss of functional alveolar capillary surface.   Carotid Artery duplex.   The vertebral arteries appear patent with antegrade flow. - Findings consistent with 1-39 percent stenosis involving the right internal carotid artery and the left internal carotid artery. - Dampened waveforms throughout carotid system, possibly cardiac related. - Based on 2:1 ratio, appears to be >50% left ECA stenosis.  TTE  Left ventricle: The cavity size was normal. There was moderateconcentric hypertrophy. Systolic function was moderately reduced.The estimated ejection fraction was in the range of 35% to 40%.There is akinesis of the mid-apicalinferolateral myocardium.There is akinesis of the basal-midinferoseptal myocardium. Doppler parameters are consistent with high ventricular filling pressure. - Aortic valve: The AV is heavily calcified and the right coronarycusp is fixed and immobile. The mean gradient and peak velocityare consistent with moderate AS but this is most likelyunderestimated but LV dysfunction with reduced Cardiac output Thecalculated AVA is consistent with severe AS and visually the valve mobility is severely reduced. Valve area (VTI): 0.91 cm^2. Valve area (Vmax): 0.82 cm^2. Valve area (Vmean): 0.87 cm^2.  - Aorta: Aortic root dimension: 42 mm (ED). - Mitral valve: Calcified annulus. There was mild regurgitation. - Left atrium: The atrium was mildly  dilated. - Right ventricle: The RV apex appears akinetic  CT angiogram chest. Confirming ascending aortic aneurysm  Right/Left Heart Cath and Coronary Angiography    Conclusion     Ost RCA to Prox RCA lesion, 90% stenosed.  Mid RCA lesion, 45% stenosed.  Ost LM lesion, 50% stenosed.  Ost LAD to Mid LAD lesion, 50% stenosed.  Dist LAD lesion, 70% stenosed.  Ost Cx to Prox Cx lesion, 70% stenosed.  1. 3 vessel obstructive CAD  - 50% ostial left main  - diffuse 50% proximal to mid LAD, 70% distal LAD- heavily calcified.  - 70% proximal LCx  -90% proximal RCA 2. Moderate pulmonary HTN with elevated LV filling pressures.  Plan: will need to consider for CABG and AVR. Plavix held.     SIGNIFICANT EVENTS: 2/18 Admit with SOB, RUL PNA. PE ruled out. Elevated trop leak thought demand ischemia.  2/19 Decompensated requiring intubation due to resp distress  2/19 self extubated  LINES/TUBES: ETT 2/19 >> 2/19   DVT Prophylaxis  :  Lovenox    Lab Results  Component Value Date   PLT 315 01/04/2016    Inpatient Medications  Scheduled Meds: . [START ON 01/08/2016] amiodarone  200 mg Oral Daily  . amiodarone  400 mg Oral 3 times per day  . antiseptic oral rinse  7 mL Mouth Rinse BID  . aspirin EC  81 mg Oral Daily  . atorvastatin  80 mg Oral q1800  . enoxaparin (LOVENOX) injection  1 mg/kg Subcutaneous Q12H  . lisinopril  2.5 mg Oral Daily  . metoprolol tartrate  25 mg Oral BID  . mometasone-formoterol  2 puff Inhalation BID  . predniSONE  40 mg Oral Q breakfast  . sodium chloride flush  3 mL Intravenous Q12H  . tiotropium  18 mcg Inhalation Daily   Continuous Infusions:  PRN Meds:.sodium chloride, acetaminophen **OR** acetaminophen, alum & mag hydroxide-simeth, ipratropium-albuterol, ondansetron **OR** ondansetron (ZOFRAN) IV, sodium chloride flush  Antibiotics  :     Anti-infectives    Start     Dose/Rate Route Frequency Ordered Stop   12/27/15  2200  cefTRIAXone (ROCEPHIN) 1 g in dextrose 5 % 50 mL IVPB     1 g 100 mL/hr over 30 Minutes Intravenous Every 24 hours 12/26/15 2140 01/01/16 2242   12/27/15 2200  azithromycin (ZITHROMAX) 500 mg in dextrose 5 % 250 mL IVPB  Status:  Discontinued     500 mg 250 mL/hr over 60 Minutes Intravenous Every 24 hours 12/26/15 2141 12/30/15 1142   12/26/15 2115  cefTRIAXone (ROCEPHIN) 1 g in dextrose 5 % 50 mL IVPB     1 g 100 mL/hr over 30 Minutes Intravenous  Once 12/26/15 2100 12/26/15 2245   12/26/15 2115  azithromycin (ZITHROMAX) 500 mg in dextrose 5 % 250 mL IVPB     500 mg 250 mL/hr over 60 Minutes Intravenous  Once 12/26/15 2100 12/27/15 0028   12/26/15 2115  oseltamivir (TAMIFLU) capsule 75 mg     75 mg Oral  Once 12/26/15 2100 12/26/15 2155        Objective:   Filed Vitals:   01/04/16 0608 01/04/16 1220 01/04/16 2105 01/05/16 0346  BP: 101/78 111/85 105/78 108/77  Pulse: 61 70 79 72  Temp: 97.5 F (36.4 C) 99.1 F (37.3 C) 97.4 F (36.3 C) 97.3 F (36.3 C)  TempSrc: Oral Oral Oral Oral  Resp: 18 19 18 18   Height:      Weight:      SpO2: 99% 100% 99% 100%    Wt Readings from Last 3 Encounters:  12/29/15 67.8 kg (149 lb 7.6 oz)  03/31/15 66.951 kg (147 lb 9.6 oz)  02/24/15 65.772 kg (145 lb)     Intake/Output Summary (Last 24 hours) at 01/05/16 0917 Last data filed at 01/04/16 1800  Gross per 24 hour  Intake    480 ml  Output      0 ml  Net    480 ml     Physical Exam  Awake Alert, Oriented X 3, No new F.N deficits, Normal affect [place].AT,PERRAL Supple Neck,No JVD, No cervical lymphadenopathy appriciated.  Symmetrical Chest wall movement, Mod air movement bilaterally, CTAB RRR,No Gallops,Rubs or new Murmurs, No Parasternal Heave +ve B.Sounds, Abd Soft, No tenderness, No organomegaly appriciated, No rebound - guarding or rigidity. No Cyanosis, Clubbing or edema, No new Rash or bruise       Data Review:   Micro Results Recent Results (from the past 240  hour(s))  MRSA PCR Screening     Status: None   Collection Time: 12/27/15 12:52 AM  Result Value Ref Range Status   MRSA by PCR NEGATIVE NEGATIVE Final    Comment:        The GeneXpert MRSA Assay (FDA approved for NASAL specimens only), is one component of a comprehensive MRSA colonization surveillance program. It is not intended to diagnose MRSA infection nor to guide or monitor treatment for MRSA infections.   Culture, respiratory (NON-Expectorated)     Status: None   Collection Time: 12/27/15  2:12 PM  Result Value Ref Range Status   Specimen Description TRACHEAL  ASPIRATE  Final   Special Requests NONE  Final   Gram Stain   Final    FEW WBC PRESENT,BOTH PMN AND MONONUCLEAR NO SQUAMOUS EPITHELIAL CELLS SEEN NO ORGANISMS SEEN Performed at [facility]    Culture   Final    NORMAL OROPHARYNGEAL FLORA Performed at [facility]    Report Status 12/29/2015 FINAL  Final  Respiratory virus panel     Status: None   Collection Time: 12/27/15  5:27 PM  Result Value Ref Range Status   Source - RVPAN NASAL SWAB  Corrected   Respiratory Syncytial Virus A Negative Negative Final   Respiratory Syncytial Virus B Negative Negative Final   Influenza A Negative Negative Final   Influenza B Negative Negative Final   Parainfluenza 1 Negative Negative Final   Parainfluenza 2 Negative Negative Final   Parainfluenza 3 Negative Negative Final   Metapneumovirus Negative Negative Final   Rhinovirus Negative Negative Final   Adenovirus Negative Negative Final    Comment: (NOTE) Performed At: [facility] [address] [place], [place] [id number] Mila Homer MD [id number]     Radiology Reports Dg Chest 2 View  12/26/2015  CLINICAL DATA:  Shortness of breath yesterday, progressive today. Occasional cough. EXAM: CHEST  2 VIEW COMPARISON:  Most recent chest radiograph 05/27/2009 FINDINGS: Advanced emphysematous change with hyperinflation. Diffuse increased  interstitial markings may reflect superimposed pulmonary edema. Blunting of the costophrenic angles, right greater than left, pleural effusions versus hyperinflation. Patchy airspace disease in the posterior right upper lobe. Heart is normal in size, mediastinal contours are normal. No pneumothorax. Remote bilateral rib fractures. IMPRESSION: 1. Patchy right upper lobe opacity, concerning for pneumonia. Followup PA and lateral chest X-ray is recommended in 3-4 weeks following trial of antibiotic therapy to ensure resolution and exclude underlying malignancy. 2. Advanced emphysema. Increased interstitial markings from prior exam may reflect superimposed edema. Small pleural effusions versus hyperinflation. Question degree of volume overload/CHF, with concurrent pneumonia, all superimposed on chronic lung disease. Electronically Signed   By: Rubye Oaks M.D.   On: 12/26/2015 18:11   Ct Angio Chest Pe W/cm &/or Wo Cm  12/26/2015  CLINICAL DATA:  66 year old male with shortness of breath copy, tachycardia. EXAM: CT ANGIOGRAPHY CHEST WITH CONTRAST TECHNIQUE: Multidetector CT imaging of the chest was performed using the standard protocol during bolus administration of intravenous contrast. Multiplanar CT image reconstructions and MIPs were obtained to evaluate the vascular anatomy. CONTRAST:  80mL OMNIPAQUE IOHEXOL 350 MG/ML SOLN COMPARISON:  Radiograph dated 12/26/2015 FINDINGS: There is emphysematous changes of the lungs. Patchy area of consolidative changes at the right lung base as well as area of interstitial thickening in the right apical region are concerning for pneumonia. There is a small right pleural effusion. No pneumothorax. The central airways are patent. There is mild bronchiectatic changes with mild thickening of the bronchial wall. There is diffuse dilatation of the ascending aorta with involvement of the proximal arch measuring up to 5.2 cm in diameter. Evaluation of the aorta is limited as  timing of the contrast was designed for optimal opacification of the pulmonary arteries. No definite dissection identified. There is no periaortic fluid. There is atherosclerotic calcification of thoracic aorta. No CT evidence of pulmonary embolism. There is no cardiomegaly or significant pericardial effusion. There is coronary vascular calcification. There is calcification of the aortic valves. There is thickened appearance of the left ventricular myocardium, likely myocardial hypertrophy. Mild dilatation of the left atrium. Correlation with echocardiogram recommended.  There is retrograde flow of contrast from the right atrium into the IVC compatible with a degree of right cardiac dysfunction. Right hilar and subcarinal adenopathy. The esophagus and the thyroid gland are grossly unremarkable. There is no axillary adenopathy. The chest wall soft tissues appear unremarkable. Old healed bilateral rib fractures. No acute fracture. The visualized upper abdomen appear grossly unremarkable. Review of the MIP images confirms the above findings. IMPRESSION: No CT evidence of pulmonary embolism. Emphysema with an area of consolidative change in the right lung base compatible with pneumonia. Small right pleural effusion. Diffuse dilatation of the ascending aorta and proximal arch measuring up to 5.2 cm. Evaluation of the aorta is limited on this study as timing of the contrast was designed to opacifying the pulmonary vasculature. No definite dissection identified. CT angiography with optimal opacification of the aorta may provide additional evaluation. Ascending thoracic aortic aneurysm. Recommend semi-annual imaging followup by CTA or MRA and referral to cardiothoracic surgery if not already obtained. This recommendation follows 2010 ACCF/AHA/AATS/ACR/ASA/SCA/SCAI/SIR/STS/SVM Guidelines for the Diagnosis and Management of Patients With Thoracic Aortic Disease. Circulation. 2010; 121: Z610-R604 Calcification of the aortic  valves with probable resulting aortic valve stenosis. Dilatation of the ascending aorta may represent post stenotic aneurysm. There is also left ventricular hypertrophy and mild dilatation of the left atrium. Echocardiogram is recommended for further evaluation. Electronically Signed   By: Elgie Collard M.D.   On: 12/26/2015 21:19   Dg Chest Port 1 View  12/28/2015  CLINICAL DATA:  Acute respiratory failure. The patient extubated himself yesterday. EXAM: PORTABLE CHEST 1 VIEW COMPARISON:  Yesterday. FINDINGS: The cardiac silhouette remains borderline enlarged. Decreased bilateral airspace opacity. Stable bilateral pleural effusions and prominence of the pulmonary vasculature and interstitial markings. No acute bony abnormality. IMPRESSION: 1. Improving congestive heart failure superimposed on COPD. 2. Persistent probable atelectasis at the right lung base and mild left basilar atelectasis. Electronically Signed   By: Beckie Salts M.D.   On: 12/28/2015 07:14   Dg Chest Port 1 View  12/27/2015  CLINICAL DATA:  Intubated. EXAM: PORTABLE CHEST 1 VIEW COMPARISON:  Yesterday. FINDINGS: Borderline enlarged cardiac silhouette with a mild increase in size. Increased airspace opacity throughout the right lung and the majority of the left lung. Increased size of small bilateral pleural effusions. Endotracheal tube in satisfactory position. Nasogastric tube tip in the mid stomach. Diffuse osteopenia. IMPRESSION: Worsening changes of congestive heart failure superimposed on COPD. Underlying pneumonia cannot be excluded. Electronically Signed   By: Beckie Salts M.D.   On: 12/27/2015 13:42   Dg Abd Portable 1v  12/27/2015  CLINICAL DATA:  Encounter for orogastric tube placement. EXAM: PORTABLE ABDOMEN - 1 VIEW COMPARISON:  None. FINDINGS: Oral/nasogastric tube passes below the diaphragm extending well into the stomach. Tip lies in the mid to distal stomach. Normal bowel gas pattern. IMPRESSION: Oral/nasogastric tube is  well positioned extending well into the stomach. Electronically Signed   By: Amie Portland M.D.   On: 12/27/2015 13:44     CBC  Recent Labs Lab 12/30/15 0340 12/31/15 0142 01/01/16 0330 01/03/16 0414 01/04/16 0525  WBC 11.9* 10.6* 9.3 9.6 10.8*  HGB 12.6* 12.4* 11.4* 12.2* 13.0  HCT 38.3* 37.6* 34.5* 38.0* 39.4  PLT 293 306 294 315 315  MCV 97.5 97.7 100.0 98.2 97.5  MCH 32.1 32.2 33.0 31.5 32.2  MCHC 32.9 33.0 33.0 32.1 33.0  RDW 13.2 13.2 12.9 12.7 12.8    Chemistries   Recent Labs Lab 12/30/15 0340 12/31/15 0142  01/01/16 0330 01/02/16 1242 01/03/16 0414  NA 139 139 136 138 138  K 5.0 5.2* 5.6* 4.5 4.4  CL 97* 99* 99* 100* 101  CO2 28 30 29 27 31   GLUCOSE 148* 138* 136* 114* 105*  BUN 31* 37* 33* 27* 21*  CREATININE 1.28* 1.12 0.96 0.88 1.04  CALCIUM 9.3 9.1 8.9 9.0 8.6*  MG  --   --   --   --  2.1  AST  --   --  36  --   --   ALT  --   --  31  --   --   ALKPHOS  --   --  50  --   --   BILITOT  --   --  0.2*  --   --    ------------------------------------------------------------------------------------------------------------------ No results for input(s): CHOL, HDL, LDLCALC, TRIG, CHOLHDL, LDLDIRECT in the last 72 hours.  Lab Results  Component Value Date   HGBA1C  05/28/2009    5.7 (NOTE) The ADA recommends the following therapeutic goal for glycemic control related to Hgb A1c measurement: Goal of therapy: <6.5 Hgb A1c  Reference: American Diabetes Association: Clinical Practice Recommendations 2010, Diabetes Care, 2010, 33: (Suppl  1).   ------------------------------------------------------------------------------------------------------------------  Recent Labs  01/04/16 0525  TSH 2.395   ------------------------------------------------------------------------------------------------------------------ No results for input(s): VITAMINB12, FOLATE, FERRITIN, TIBC, IRON, RETICCTPCT in the last 72 hours.  Coagulation profile  Recent Labs Lab  01/03/16 0414  INR 1.10    No results for input(s): DDIMER in the last 72 hours.  Cardiac Enzymes No results for input(s): CKMB, TROPONINI, MYOGLOBIN in the last 168 hours.  Invalid input(s): CK ------------------------------------------------------------------------------------------------------------------    Component Value Date/Time   BNP 1306.4* 12/27/2015 1703    Time Spent in minutes  35   Eduarda Scrivens K M.D on 01/05/2016 at 9:17 AM  Between 7am to 7pm - Pager - [phone]  After 7pm go to [email or web address] - password [facility]  [facility] -  Office  [phone]

## 2016-01-05 NOTE — Progress Notes (Signed)
Patient sitting up in chair no pain, distress or needs expressed at this time.

## 2016-01-06 DIAGNOSIS — I712 Thoracic aortic aneurysm, without rupture: Secondary | ICD-10-CM

## 2016-01-06 DIAGNOSIS — I2510 Atherosclerotic heart disease of native coronary artery without angina pectoris: Secondary | ICD-10-CM

## 2016-01-06 DIAGNOSIS — I350 Nonrheumatic aortic (valve) stenosis: Secondary | ICD-10-CM

## 2016-01-06 LAB — BASIC METABOLIC PANEL
Anion gap: 12 (ref 5–15)
BUN: 15 mg/dL (ref 6–20)
CHLORIDE: 99 mmol/L — AB (ref 101–111)
CO2: 28 mmol/L (ref 22–32)
CREATININE: 1.15 mg/dL (ref 0.61–1.24)
Calcium: 9.2 mg/dL (ref 8.9–10.3)
GFR calc Af Amer: >60 mL/min (ref >60)
GLUCOSE: 120 mg/dL — AB (ref 65–99)
POTASSIUM: 4 mmol/L (ref 3.5–5.1)
Sodium: 139 mmol/L (ref 135–145)

## 2016-01-06 LAB — CBC
HCT: 41.5 % (ref 39.0–52.0)
Hemoglobin: 13.5 g/dL (ref 13.0–17.0)
MCH: 32.1 pg (ref 26.0–34.0)
MCHC: 32.5 g/dL (ref 30.0–36.0)
MCV: 98.6 fL (ref 78.0–100.0)
PLATELETS: 314 10*3/uL (ref 150–400)
RBC: 4.21 MIL/uL — AB (ref 4.22–5.81)
RDW: 13.4 % (ref 11.5–15.5)
WBC: 14.1 10*3/uL — ABNORMAL HIGH (ref 4.0–10.5)

## 2016-01-06 LAB — ABO/RH: ABO/RH(D): A POS

## 2016-01-06 LAB — MAGNESIUM: Magnesium: 1.9 mg/dL (ref 1.7–2.4)

## 2016-01-06 MED ORDER — DOPAMINE-DEXTROSE 3.2-5 MG/ML-% IV SOLN
0.0000 ug/kg/min | INTRAVENOUS | Status: DC
  Administered 2016-01-07: 3 ug/kg/min via INTRAVENOUS
  Filled 2016-01-06: qty 250

## 2016-01-06 MED ORDER — BISACODYL 5 MG PO TBEC: 5.0000 mg | DELAYED_RELEASE_TABLET | Freq: Once | ORAL | Status: DC

## 2016-01-06 MED ORDER — DIAZEPAM 5 MG PO TABS
5.0000 mg | ORAL_TABLET | Freq: Once | ORAL | Status: AC
Start: 2016-01-07 — End: 2016-01-07
  Administered 2016-01-07: 5 mg via ORAL
  Filled 2016-01-06: qty 1

## 2016-01-06 MED ORDER — SODIUM CHLORIDE 0.9 % IV SOLN
INTRAVENOUS | Status: AC
  Administered 2016-01-07: 69.8 mL/h via INTRAVENOUS
  Filled 2016-01-06: qty 40

## 2016-01-06 MED ORDER — DEXMEDETOMIDINE HCL IN NACL 400 MCG/100ML IV SOLN
0.1000 ug/kg/h | INTRAVENOUS | Status: DC
  Administered 2016-01-07: .3 ug/kg/h via INTRAVENOUS
  Filled 2016-01-06: qty 100

## 2016-01-06 MED ORDER — MAGNESIUM SULFATE 50 % IJ SOLN
40.0000 meq | INTRAMUSCULAR | Status: DC
  Filled 2016-01-06: qty 10

## 2016-01-06 MED ORDER — SODIUM CHLORIDE 0.9 % IV SOLN
INTRAVENOUS | Status: AC
  Administered 2016-01-07: .9 [IU]/h via INTRAVENOUS
  Filled 2016-01-06: qty 2.5

## 2016-01-06 MED ORDER — METHYLPREDNISOLONE SODIUM SUCC 40 MG IJ SOLR: 40.0000 mg | Freq: Every day | INTRAMUSCULAR | Status: DC

## 2016-01-06 MED ORDER — PLASMA-LYTE 148 IV SOLN
INTRAVENOUS | Status: AC
  Administered 2016-01-07: 500 mL
  Filled 2016-01-06: qty 2.5

## 2016-01-06 MED ORDER — POTASSIUM CHLORIDE 2 MEQ/ML IV SOLN
80.0000 meq | INTRAVENOUS | Status: DC
  Filled 2016-01-06: qty 40

## 2016-01-06 MED ORDER — VANCOMYCIN HCL 10 G IV SOLR
1250.0000 mg | INTRAVENOUS | Status: AC
  Administered 2016-01-07: 1250 mg via INTRAVENOUS
  Filled 2016-01-06: qty 1250

## 2016-01-06 MED ORDER — CHLORHEXIDINE GLUCONATE CLOTH 2 % EX PADS
6.0000 | MEDICATED_PAD | Freq: Once | CUTANEOUS | Status: AC
Start: 2016-01-06 — End: 2016-01-07
  Administered 2016-01-07: 6 via TOPICAL

## 2016-01-06 MED ORDER — CHLORHEXIDINE GLUCONATE CLOTH 2 % EX PADS
6.0000 | MEDICATED_PAD | Freq: Once | CUTANEOUS | Status: AC
  Administered 2016-01-06: 6 via TOPICAL

## 2016-01-06 MED ORDER — ALPRAZOLAM 0.25 MG PO TABS: 0.2500 mg | ORAL_TABLET | ORAL | Status: DC | PRN

## 2016-01-06 MED ORDER — EPINEPHRINE HCL 1 MG/ML IJ SOLN
0.0000 ug/min | INTRAVENOUS | Status: DC
  Filled 2016-01-06: qty 4

## 2016-01-06 MED ORDER — TEMAZEPAM 15 MG PO CAPS
15.0000 mg | ORAL_CAPSULE | Freq: Once | ORAL | Status: AC | PRN
  Administered 2016-01-06: 15 mg via ORAL
  Filled 2016-01-06: qty 1

## 2016-01-06 MED ORDER — METOPROLOL TARTRATE 12.5 MG HALF TABLET
12.5000 mg | ORAL_TABLET | Freq: Once | ORAL | Status: AC
  Administered 2016-01-07: 12.5 mg via ORAL
  Filled 2016-01-06: qty 1

## 2016-01-06 MED ORDER — DEXTROSE 5 % IV SOLN
1.5000 g | INTRAVENOUS | Status: AC
  Administered 2016-01-07: 08:00:00 via INTRAVENOUS
  Administered 2016-01-07: .75 g via INTRAVENOUS
  Administered 2016-01-07: 1.5 g via INTRAVENOUS
  Filled 2016-01-06: qty 1.5

## 2016-01-06 MED ORDER — SODIUM CHLORIDE 0.9 % IV SOLN
INTRAVENOUS | Status: DC
  Filled 2016-01-06: qty 30

## 2016-01-06 MED ORDER — NITROGLYCERIN IN D5W 200-5 MCG/ML-% IV SOLN
2.0000 ug/min | INTRAVENOUS | Status: AC
  Administered 2016-01-07: 5 ug/min via INTRAVENOUS
  Filled 2016-01-06: qty 250

## 2016-01-06 MED ORDER — PHENYLEPHRINE HCL 10 MG/ML IJ SOLN
30.0000 ug/min | INTRAVENOUS | Status: AC
  Administered 2016-01-07: 20 ug/min via INTRAVENOUS
  Filled 2016-01-06: qty 2

## 2016-01-06 MED ORDER — DEXTROSE 5 % IV SOLN
750.0000 mg | INTRAVENOUS | Status: DC
Start: 2016-01-07 — End: 2016-01-07
  Filled 2016-01-06: qty 750

## 2016-01-06 MED ORDER — CHLORHEXIDINE GLUCONATE 0.12 % MT SOLN
15.0000 mL | Freq: Once | OROMUCOSAL | Status: AC
  Administered 2016-01-07: 15 mL via OROMUCOSAL
  Filled 2016-01-06: qty 15

## 2016-01-06 NOTE — Progress Notes (Signed)
PULMONARY / CRITICAL CARE MEDICINE   Name: Craig [name] MRN: [id number] DOB: 1951-06-10    ADMISSION DATE:  12/26/2015 CONSULTATION DATE:  12/27/15  REFERRING MD:  Dr. David [name]  CHIEF COMPLAINT:  Acute Hypoxic Respiratory Failure  HISTORY OF PRESENT ILLNESS:   65 y/o M with PMH of emphysema / COPD admitted 2/18 with increased SOB, chest tightness. Found to have RLL PNA. Flu negative. Elevated troponin in setting of demand ischemia. Decompensated on 2/19 requiring brief intubation for acute pulmonary edema Echo showed severe AS with EF of 35% Cath showed 3 vessel CAD   SUBJECTIVE:  No CP,dyspnea Up in chair, having breakfast on 2L [place]    VITAL SIGNS: BP 102/80 mmHg  Pulse 64  Temp(Src) 97.4 F (36.3 C) (Oral)  Resp 18  Ht  (1.753 m)  Wt 149 lb 7.6 oz (67.8 kg)  BMI 22.06 kg/m2  SpO2 100%  HEMODYNAMICS:    VENTILATOR SETTINGS:    INTAKE / OUTPUT: I/O last 3 completed shifts: In: 840 [P.O.:840] Out: -   PHYSICAL EXAMINATION: General:  Thin adult male Neuro:  AAOx3, interactive, in good spirits HEENT:  MM, NCAT.  Cardiovascular:  s1s2 rrr, difficult to hear murmur, no carotid radiation  Lungs:  Less labored , lungs decreased bilaterally at the bases.  Abdomen:  Soft, bsx4 active Musculoskeletal:  No acute deformities  Skin: no edema, rashes or lesions  LABS:  BMET  Recent Labs Lab 01/01/16 0330 01/02/16 1242 01/03/16 0414  NA 136 138 138  K 5.6* 4.5 4.4  CL 99* 100* 101  CO2 BUN 33* 27* 21*  CREATININE 0.96 0.88 1.04  GLUCOSE 136* 114* 105*    Electrolytes  Recent Labs Lab 01/01/16 0330 01/02/16 1242 01/03/16 0414  CALCIUM 8.9 9.0 8.6*  MG  --   --  2.1    CBC  Recent Labs Lab 01/01/16 0330 01/03/16 0414 01/04/16 0525  WBC 9.3 9.6 10.8*  HGB 11.4* 12.2* 13.0  HCT 34.5* 38.0* 39.4  PLT 294 315 315    Coag's  Recent Labs Lab 01/03/16 0414  INR 1.10    Sepsis Markers No results for input(s):  LATICACIDVEN, PROCALCITON, O2SATVEN in the last 168 hours.  ABG No results for input(s): PHART, PCO2ART, PO2ART in the last 168 hours.  Liver Enzymes  Recent Labs Lab 01/01/16 0330  AST 36  ALT 31  ALKPHOS 50  BILITOT 0.2*  ALBUMIN 2.9*    Cardiac Enzymes No results for input(s): TROPONINI, PROBNP in the last 168 hours.  Glucose No results for input(s): GLUCAP in the last 168 hours.  Imaging No results found.   STUDIES:  2/18  CTA Chest >> negative for PE, emphysema, RUL consolidation, small R pleural effusion, dilation of ascending aortia, proximal arch measuring up to 5.2 cm without dissection, and ascending thoracic aortic aneurysm.  2/20 echo >> sever AS, EF 35% 2/21 cath 2/27 PFTs show severe airway obstruction with FEV1 of 1.28, 35%, DLCO of 26%, no hyperinflation  CULTURES: Tracheal Aspirate 2/19 >> ng Flu 2/18 >> negative   ANTIBIOTICS: Rocephin 2/18 >> 2/27 Azithromycin 2/18 >> off Tamiflu 2/18 >> 2/21  SIGNIFICANT EVENTS: 2/18  Admit with SOB, RUL PNA.  PE ruled out.  Elevated trop leak thought demand ischemia.   2/19  Decompensated requiring intubation due to resp distress  2/19 self extubated  LINES/TUBES: ETT 2/19 >> 2/19  DISCUSSION: 65 y/o M with PMH of emphysema / COPD admitted 2/18 with increased  SOB, chest tightness.  Found to have RLL PNA.  Flu negative. Elevated troponin in setting of demand ischemia.  Decompensated on 2/19 requiring brief  intubation.    ASSESSMENT / PLAN:  PULMONARY A: Acute Hypoxic Respiratory Failure - in the setting of RLL PNA, Severe Emphysema with acute exacerbation, possible component of pulmonary edema. Flu negative PCR.  Acute Exacerbation of COPD - pre op FEV1 1.20, DLCO 28% Hx Tobacco Abuse  Pneumonia P:   Duoneb Q6 with Q3 PRN albuterol  ct spiriva, dulera - change to pulmicort [place]  peri-op Pred 40 - solumedrol 40 on day of surgery    CARDIOVASCULAR A:  NSTEMI Severe Aortic Stenosis - not a  candidate for TAVR Aortic Root dilation 3V- CAD Frequent PVC's  P:  Appreciate Cards, CT surg.   Tolerating metoprolol and low-dose lisinopril Continue lipitor, ASA  D/w Dr Craig [name] -His lung function is poor but not prohibitive of cardiac surgery. He is obviously high risk due to long duration of surgery. He understands risk of prolonged mechanical ventilation. His deterioration on 2/19 was clearly due to pulmonary edema He has living will & has designated sister as Craig [name] (his son is 28 )  PCCM will follow post op  Craig Mourning MD. FCCP. [name] Pager [phone] If no response call [phone]

## 2016-01-06 NOTE — Progress Notes (Signed)
8 Days Post-Op Procedure(s) (LRB): Right/Left Heart Cath and Coronary Angiography (N/A) Subjective:  No complaints  Objective: Vital signs in last 24 hours: Temp:  [97.4 F (36.3 C)-98.1 F (36.7 C)] 97.4 F (36.3 C) (03/01 0548) Pulse Rate:  [64-76] 64 (03/01 0548) Cardiac Rhythm:  [-] Normal sinus rhythm (02/28 1928) Resp:  [17-18] 18 (03/01 0548) BP: (102-112)/(69-80) 102/80 mmHg (03/01 0548) SpO2:  [99 %-100 %] 100 % (03/01 0548)  Hemodynamic parameters for last 24 hours:    Intake/Output from previous day: 02/28 0701 - 03/01 0700 In: 840 [P.O.:840] Out: -  Intake/Output this shift:    General appearance: alert and cooperative Neurologic: intact Heart: regular rate and rhythm and 2/6 systolic murmur RUSB Lungs: clear to auscultation bilaterally Extremities: extremities normal, atraumatic, no cyanosis or edema  Lab Results:  Recent Labs  01/04/16 0525  WBC 10.8*  HGB 13.0  HCT 39.4  PLT 315   BMET: No results for input(s): NA, K, CL, CO2, GLUCOSE, BUN, CREATININE, CALCIUM in the last 72 hours.  PT/INR: No results for input(s): LABPROT, INR in the last 72 hours. ABG    Component Value Date/Time   PHART 7.409 12/29/2015 1407   HCO3 25.1* 12/29/2015 1415   TCO2 26 12/29/2015 1415   ACIDBASEDEF 1.0 12/29/2015 1415   O2SAT 57.0 12/29/2015 1415   CBG (last 3)  No results for input(s): GLUCAP in the last 72 hours.  Assessment/Plan:  Severe multi-vessel coronary disease with moderate LV dysfunction and LVEF of 35-40%.  Severe AS  Aortic root, ascending and arch aneurysm.  Right lung pneumonia and respiratory failure which could have been precipitated by heart failure in this gentleman with a long prior smoking history and COPD with chronic exertional dyspnea. Improved.  Will plan to do surgery in the am. I discussed the operative procedure with the patient  including alternatives, benefits and risks; including but not limited to bleeding, blood  transfusion, infection, stroke, myocardial infarction, graft failure, heart block requiring a permanent pacemaker, organ dysfunction, prolonged ventilation and death.  Craig [name] understands and agrees to proceed.    LOS: 11 days    Alleen Borne 01/06/2016

## 2016-01-06 NOTE — Progress Notes (Signed)
Utilization review completed.  

## 2016-01-06 NOTE — Progress Notes (Signed)
[facility] PROGRESS NOTE  Craig [name] [facility] ZOX:[id number] DOB: [birth year] DOA: 12/26/2015 PCP: Rose Fillers, PA-C  Brief interval history 65 y/o M with PMH of HTN, HLD, ETOH abuse, tobacco abuse and COPD who presented to [facility] on 12/26/15 with a 24 hour history of worsening shortness of breath, cough, chest tightness, fevers/chilld, and myalgias, RA saturation of 91%. CTA of the chest which was negative for PE but demonstrated emphysema, RUL consolidation, small R pleural effusion, dilation of ascending aortia, proximal arch measuring up to 5.2 cm without dissection, and ascending thoracic aortic aneurysm.   The patient was admitted per [facility] for community acquired PNA. He was treated with IV rocephin / azithromycin. Due to elevated troponin, Cardiology was consulted for evaluation. He was placed on a heparin gtt, nitro gtt, plavix, asa, and lopressor. Cardiology evaluation felt elevated troponin likely due to demand ischemia in the setting of PNA with underlying severe emphysema.   On 2/19, the patient had progressive respiratory distress and hypoxemia.Intubated and transferred to ICU. ICU patient self extubated himself, he was seen by cardiology, he also underwent left heart cath which confirmed triple vessel disease requiring CABG, echo revealed severe AS. Cardiothoracic surgery is following and plan is for open heart surgery on March 2st after cardiothoracic team discusses the case with cardiology and pulmonary.      Assessment/Plan: 1. Acute hypoxic and hypercapnic respiratory failure due to COPD exacerbation and right lower lobe community-acquired pneumonia and acute on chronic systolic heart failure.  Influenza PCR negative, CT angiogram negative for PE, briefly intubated and treated in ICU. Now on nasal cannula oxygen much improvement, has finished antibiotics, continue supportive care. Pulmonary recalled in anticipation of open heart surgery, PFTs showing severe  COPD.  2. Acute on chronic systolic heart failure the setting of severe aortic stenosis, triple-vessel CAD  S/P left heart catheterization this admission, underlying ischemic cardiomyopathy. Compensated from a CHF standpoint, on beta blocker and low-dose ACE inhibitor if blood pressure tolerates. Cardiology following along with cardiothoracic surgery.  3. Paroxysmal atrial fibrillation with runs of SVT.  CHA2DS2VASC 2 score of greater than 3. Currently on full dose Lovenox, amiodarone and beta blocker. Cardiology following.  4. Non-STEMI with severe aortic stenosis.  Left heart catheterization showing triple-vessel disease, due for CABG, currently on aspirin, full dose Lovenox, beta blocker and statin for secondary prevention. Plavix on hold currently. Chest pain-free. Awaiting CABG, cardiothoracic surgery on board. Patient for surgery tomorrow. Per cardiothoracic surgery.  5. Dyslipidemia.  Continue statin.   6. Ascending aortic aneurysm noted on CT angiogram.  Cardio thoracic surgery following.  7. Carotid artery stenosis more than 50% on the left side.  Continue secondary prevention with antiplatelet therapy and statin, outpatient vascular surgery follow-up post discharge.    Code Status: Full Family Communication: Updated patient and wife at bedside. Disposition Plan: Remain inpatient pending cardiac surgery.   Consultants:  Cardiothoracic surgery Dr. Laneta Simmers 12/29/2015  PCCM Dr. Vassie Loll 12/27/2015  Cardiology: Dr. Sullivan Lone 12/26/2015  Procedures:  ETT 12/27/2015>>>> 12/27/2015 PFTS  Interpretation: The FEV1, FEV1/FVC ratio and FEF25-75% are reduced indicating airway obstruction. It is impossible to adequately evaluate the flow volume loop due to excessive variablity such as might be produced by coughing. The increased airway resistance and decreased specific conductance indicate a central airway disease. Lung volumes are within normal limits. Following administration of  bronchodilators, there is no significant response. The reduced diffusing capacity indicates a severe loss of functional alveolar capillary surface.   Carotid Artery duplex.  The vertebral arteries appear patent with antegrade flow. - Findings consistent with 1-39 percent stenosis involving the right internal carotid artery and the left internal carotid artery. - Dampened waveforms throughout carotid system, possibly cardiac related. - Based on 2:1 ratio, appears to be >50% left ECA stenosis.  TTE  Left ventricle: The cavity size was normal. There was moderateconcentric hypertrophy. Systolic function was moderately reduced.The estimated ejection fraction was in the range of 35% to 40%.There is akinesis of the mid-apicalinferolateral myocardium.There is akinesis of the basal-midinferoseptal myocardium. Doppler parameters are consistent with high ventricular filling pressure. - Aortic valve: The AV is heavily calcified and the right coronarycusp is fixed and immobile. The mean gradient and peak velocityare consistent with moderate AS but this is most likelyunderestimated but LV dysfunction with reduced Cardiac output Thecalculated AVA is consistent with severe AS and visually the valve mobility is severely reduced. Valve area (VTI): 0.91 cm^2. Valve area (Vmax): 0.82 cm^2. Valve area (Vmean): 0.87 cm^2.  - Aorta: Aortic root dimension: 42 mm (ED). - Mitral valve: Calcified annulus. There was mild regurgitation. - Left atrium: The atrium was mildly dilated. - Right ventricle: The RV apex appears akinetic  CT angiogram chest. Confirming ascending aortic aneurysm  Right/Left Heart Cath and Coronary Angiography    Conclusion     Ost RCA to Prox RCA lesion, 90% stenosed.  Mid RCA lesion, 45% stenosed.  Ost LM lesion, 50% stenosed.  Ost LAD to Mid LAD lesion, 50% stenosed.  Dist LAD lesion, 70% stenosed.  Ost Cx to Prox Cx lesion, 70% stenosed.  1. 3 vessel  obstructive CAD  - 50% ostial left main  - diffuse 50% proximal to mid LAD, 70% distal LAD- heavily calcified.  - 70% proximal LCx  -90% proximal RCA 2. Moderate pulmonary HTN with elevated LV filling pressures.  Plan: will need to consider for CABG and AVR. Plavix held.     SIGNIFICANT EVENTS: 2/18 Admit with SOB, RUL PNA. PE ruled out. Elevated trop leak thought demand ischemia.  2/19 Decompensated requiring intubation due to resp distress  2/19 self extubated        Antibiotics:  IV Rocephin 12/26/2015>>>> 01/01/2016  IV azithromycin 12/26/2015>>>>> 12/30/2015  Tamiflu 12/26/2015>>>> 12/26/2015  HPI/Subjective: Patient denies any chest pain. No shortness of breath. Patient eating breakfast.  Objective: Filed Vitals:   01/05/16 1947 01/06/16 0548  BP: 112/76 102/80  Pulse: 76 64  Temp: 97.5 F (36.4 C) 97.4 F (36.3 C)  Resp: 17 18    Intake/Output Summary (Last 24 hours) at 01/06/16 1244 Last data filed at 01/06/16 0900  Gross per 24 hour  Intake    840 ml  Output      0 ml  Net    840 ml   Filed Weights   12/27/15 0058 12/29/15 0706  Weight: 65.7 kg (144 lb 13.5 oz) 67.8 kg (149 lb 7.6 oz)    Exam:   General:  NAD  Cardiovascular: RRR with 3/6 SEM  Respiratory: CTAB  Abdomen: Soft, nontender, nondistended, positive bowel sounds.  Musculoskeletal: No clubbing cyanosis or edema.  Data Reviewed: Basic Metabolic Panel:  Recent Labs Lab 12/31/15 0142 01/01/16 0330 01/02/16 1242 01/03/16 0414 01/06/16 0945  NA 139 136 138 138 139  K 5.2* 5.6* 4.5 4.4 4.0  CL 99* 99* 100* 101 99*  CO2 30 29 27 31 28   GLUCOSE 138* 136* 114* 105* 120*  BUN 37* 33* 27* 21* 15  CREATININE 1.12 0.96 0.88 1.04 1.15  CALCIUM 9.1  8.9 9.0 8.6* 9.2  MG  --   --   --  2.1 1.9   Liver Function Tests:  Recent Labs Lab 01/01/16 0330  AST 36  ALT 31  ALKPHOS 50  BILITOT 0.2*  PROT 5.8*  ALBUMIN 2.9*   No results for input(s): LIPASE,  AMYLASE in the last 168 hours. No results for input(s): AMMONIA in the last 168 hours. CBC:  Recent Labs Lab 12/31/15 0142 01/01/16 0330 01/03/16 0414 01/04/16 0525 01/06/16 0945  WBC 10.6* 9.3 9.6 10.8* 14.1*  HGB 12.4* 11.4* 12.2* 13.0 13.5  HCT 37.6* 34.5* 38.0* 39.4 41.5  MCV 97.7 100.0 98.2 97.5 98.6  PLT 306 294 315 315 314   Cardiac Enzymes: No results for input(s): CKTOTAL, CKMB, CKMBINDEX, TROPONINI in the last 168 hours. BNP (last 3 results)  Recent Labs  12/27/15 1703  BNP 1306.4*    ProBNP (last 3 results) No results for input(s): PROBNP in the last 8760 hours.  CBG: No results for input(s): GLUCAP in the last 168 hours.  Recent Results (from the past 240 hour(s))  Culture, respiratory (NON-Expectorated)     Status: None   Collection Time: 12/27/15  2:12 PM  Result Value Ref Range Status   Specimen Description TRACHEAL ASPIRATE  Final   Special Requests NONE  Final   Gram Stain   Final    FEW WBC PRESENT,BOTH PMN AND MONONUCLEAR NO SQUAMOUS EPITHELIAL CELLS SEEN NO ORGANISMS SEEN Performed at [facility]    Culture   Final    NORMAL OROPHARYNGEAL FLORA Performed at [facility]    Report Status 12/29/2015 FINAL  Final  Respiratory virus panel     Status: None   Collection Time: 12/27/15  5:27 PM  Result Value Ref Range Status   Source - RVPAN NASAL SWAB  Corrected   Respiratory Syncytial Virus A Negative Negative Final   Respiratory Syncytial Virus B Negative Negative Final   Influenza A Negative Negative Final   Influenza B Negative Negative Final   Parainfluenza 1 Negative Negative Final   Parainfluenza 2 Negative Negative Final   Parainfluenza 3 Negative Negative Final   Metapneumovirus Negative Negative Final   Rhinovirus Negative Negative Final   Adenovirus Negative Negative Final    Comment: (NOTE) Performed At: [facility] [address] [place], [place] [id number] Mila Homer MD WU:[id number]       Studies: No results found.  Scheduled Meds: . [START ON 01/08/2016] amiodarone  200 mg Oral Daily  . amiodarone  400 mg Oral 3 times per day  . antiseptic oral rinse  7 mL Mouth Rinse BID  . aspirin EC  81 mg Oral Daily  . atorvastatin  80 mg Oral q1800  . enoxaparin (LOVENOX) injection  1 mg/kg Subcutaneous Q12H  . lisinopril  2.5 mg Oral Daily  . [START ON 01/07/2016] methylPREDNISolone (SOLU-MEDROL) injection  40 mg Intravenous Daily  . metoprolol tartrate  25 mg Oral BID  . mometasone-formoterol  2 puff Inhalation BID  . sodium chloride flush  3 mL Intravenous Q12H  . tiotropium  18 mcg Inhalation Daily   Continuous Infusions:   Principal Problem:   Acute respiratory failure with hypoxia (HCC) Active Problems:   CAP (community acquired pneumonia)   NSTEMI (non-ST elevated myocardial infarction) (HCC)   COPD (chronic obstructive pulmonary disease) (HCC)   Hypoxia   Elevated troponin   History of hypertension   History of hyperlipidemia   Ascending aortic aneurysm (HCC)  Aortic stenosis   Acute respiratory failure (HCC)   CAD (coronary artery disease)   Coronary artery disease involving native coronary artery of native heart with unstable angina pectoris (HCC)   Severe aortic stenosis   Coronary artery disease involving native coronary artery of native heart without angina pectoris    Time spent: 40 minutes    [facility] M.D. [facility] Pager [phone]. If 7PM-7AM, please contact night-coverage at [email or web address], password [facility] 01/06/2016, 12:44 PM  LOS: 11 days

## 2016-01-06 NOTE — Care Management Note (Signed)
Case Management Note Previous CM note initiated by Raynald Blend RN, CM  Patient Details  Name: Craig [name] MRN: [id number] Date of Birth: Jul 08, 1951  Subjective/Objective:        Pt admitted with Acute Respiratory Failure -  Pt is now extubated on [place]     Action/Plan:  Pt is independent from home with son [name].  Pt is without insurance; will need PCP initiated at free clinic prior to discharge. CM will continue to monitor for disposition needs   Expected Discharge Date:                  Expected Discharge Plan:  Home/Self Care  In-House Referral:     Discharge planning Services  CM Consult  Post Acute Care Choice:    Choice offered to:     DME Arranged:    DME Agency:     HH Arranged:    HH Agency:     Status of Service:  In process, will continue to follow  Medicare Important Message Given:    Date Medicare IM Given:    Medicare IM give by:    Date Additional Medicare IM Given:    Additional Medicare Important Message give by:     If discussed at Long Length of Stay Meetings, dates discussed:  01/05/16, 01/07/16  Additional Comments:  01/06/16- 1145- [name] RN, BSN- plan for CABG/AVR tomorrow 3/2- CM will follow post op for d/c needs  [name], RN 01/06/2016, 11:46 AM

## 2016-01-06 NOTE — Progress Notes (Signed)
Patient Name: Craig [name] Date of Encounter: 01/06/2016  Primary Cardiologist: Dr. Tresa Endo   Principal Problem:   Acute respiratory failure with hypoxia [facility]) Active Problems:   CAP (community acquired pneumonia)   NSTEMI (non-ST elevated myocardial infarction) (HCC)   COPD (chronic obstructive pulmonary disease) (HCC)   Hypoxia   Elevated troponin   History of hypertension   History of hyperlipidemia   Ascending aortic aneurysm (HCC)   Aortic stenosis   Acute respiratory failure (HCC)   CAD (coronary artery disease)   Coronary artery disease involving native coronary artery of native heart with unstable angina pectoris (HCC)   Severe aortic stenosis   Coronary artery disease involving native coronary artery of native heart without angina pectoris    SUBJECTIVE  Denies any CP or SOB.   CURRENT MEDS . [START ON 01/08/2016] amiodarone  200 mg Oral Daily  . amiodarone  400 mg Oral 3 times per day  . antiseptic oral rinse  7 mL Mouth Rinse BID  . aspirin EC  81 mg Oral Daily  . atorvastatin  80 mg Oral q1800  . enoxaparin (LOVENOX) injection  1 mg/kg Subcutaneous Q12H  . lisinopril  2.5 mg Oral Daily  . [START ON 01/07/2016] methylPREDNISolone (SOLU-MEDROL) injection  40 mg Intravenous Daily  . metoprolol tartrate  25 mg Oral BID  . mometasone-formoterol  2 puff Inhalation BID  . sodium chloride flush  3 mL Intravenous Q12H  . tiotropium  18 mcg Inhalation Daily    OBJECTIVE  Filed Vitals:   01/05/16 1156 01/05/16 1947 01/06/16 0548 01/06/16 0827  BP: 108/69 112/76 102/80   Pulse: 64 76 64   Temp: 98.1 F (36.7 C) 97.5 F (36.4 C) 97.4 F (36.3 C)   TempSrc: Oral Oral Oral   Resp: 17 17 18    Height:      Weight:      SpO2: 100% 99% 100% 99%    Intake/Output Summary (Last 24 hours) at 01/06/16 1045 Last data filed at 01/06/16 0900  Gross per 24 hour  Intake    840 ml  Output      0 ml  Net    840 ml   Filed Weights   12/27/15 0058 12/29/15 0706    Weight: 144 lb 13.5 oz (65.7 kg) 149 lb 7.6 oz (67.8 kg)    PHYSICAL EXAM  General: Pleasant, NAD. Neuro: Alert and oriented X 3. Moves all extremities spontaneously. Psych: Normal affect. HEENT:  Normal  Neck: Supple without bruits or JVD. Lungs:  Resp regular and unlabored. CTA Heart: RRR no s3, s4. 2/6 systolic murmur at RUSB Abdomen: Soft, non-tender, non-distended, BS + x 4.  Extremities: No clubbing, cyanosis or edema. DP/PT/Radials 2+ and equal bilaterally.  Accessory Clinical Findings  CBC  Recent Labs  01/04/16 0525  WBC 10.8*  HGB 13.0  HCT 39.4  MCV 97.5  PLT 315   Thyroid Function Tests  Recent Labs  01/04/16 0525  TSH 2.395    TELE NSR with frequent PVCs    ECG  No new EKG  Echocardiogram 12/28/2015  LV EF: 35% -  40%  ------------------------------------------------------------------- Indications:   Chest pain 786.51.  ------------------------------------------------------------------- History:  PMH: Elevated troponin. Chronic obstructive pulmonary disease. PMH:  Myocardial infarction. Risk factors: Hypertension. Dyslipidemia.  ------------------------------------------------------------------- Study Conclusions  - Left ventricle: The cavity size was normal. There was moderate concentric hypertrophy. Systolic function was moderately reduced. The estimated ejection fraction was in the range of 35% to 40%. There  is akinesis of the mid-apicalinferolateral myocardium. There is akinesis of the basal-midinferoseptal myocardium. Doppler parameters are consistent with high ventricular filling pressure. - Aortic valve: The AV is heavily calcified and the right coronary cusp is fixed and immobile. The mean gradient and peak velocity are consistent with moderate AS but this is most likely underestimated but LV dysfunction with reduced Cardiac output The calculated AVA is consistent with severe AS and visually  the valve mobility is severely reduced. Valve area (VTI): 0.91 cm^2. Valve area (Vmax): 0.82 cm^2. Valve area (Vmean): 0.87 cm^2. - Aorta: Aortic root dimension: 42 mm (ED). - Mitral valve: Calcified annulus. There was mild regurgitation. - Left atrium: The atrium was mildly dilated. - Right ventricle: The RV apex appears akinetic.    Radiology/Studies  Dg Chest 2 View  12/26/2015  CLINICAL DATA:  Shortness of breath yesterday, progressive today. Occasional cough. EXAM: CHEST  2 VIEW COMPARISON:  Most recent chest radiograph 05/27/2009 FINDINGS: Advanced emphysematous change with hyperinflation. Diffuse increased interstitial markings may reflect superimposed pulmonary edema. Blunting of the costophrenic angles, right greater than left, pleural effusions versus hyperinflation. Patchy airspace disease in the posterior right upper lobe. Heart is normal in size, mediastinal contours are normal. No pneumothorax. Remote bilateral rib fractures. IMPRESSION: 1. Patchy right upper lobe opacity, concerning for pneumonia. Followup PA and lateral chest X-ray is recommended in 3-4 weeks following trial of antibiotic therapy to ensure resolution and exclude underlying malignancy. 2. Advanced emphysema. Increased interstitial markings from prior exam may reflect superimposed edema. Small pleural effusions versus hyperinflation. Question degree of volume overload/CHF, with concurrent pneumonia, all superimposed on chronic lung disease. Electronically Signed   By: Rubye Oaks M.D.   On: 12/26/2015 18:11   Ct Angio Chest Pe W/cm &/or Wo Cm  12/26/2015  CLINICAL DATA:  65 year old male with shortness of breath copy, tachycardia. EXAM: CT ANGIOGRAPHY CHEST WITH CONTRAST TECHNIQUE: Multidetector CT imaging of the chest was performed using the standard protocol during bolus administration of intravenous contrast. Multiplanar CT image reconstructions and MIPs were obtained to evaluate the vascular anatomy.  CONTRAST:  80mL OMNIPAQUE IOHEXOL 350 MG/ML SOLN COMPARISON:  Radiograph dated 12/26/2015 FINDINGS: There is emphysematous changes of the lungs. Patchy area of consolidative changes at the right lung base as well as area of interstitial thickening in the right apical region are concerning for pneumonia. There is a small right pleural effusion. No pneumothorax. The central airways are patent. There is mild bronchiectatic changes with mild thickening of the bronchial wall. There is diffuse dilatation of the ascending aorta with involvement of the proximal arch measuring up to 5.2 cm in diameter. Evaluation of the aorta is limited as timing of the contrast was designed for optimal opacification of the pulmonary arteries. No definite dissection identified. There is no periaortic fluid. There is atherosclerotic calcification of thoracic aorta. No CT evidence of pulmonary embolism. There is no cardiomegaly or significant pericardial effusion. There is coronary vascular calcification. There is calcification of the aortic valves. There is thickened appearance of the left ventricular myocardium, likely myocardial hypertrophy. Mild dilatation of the left atrium. Correlation with echocardiogram recommended. There is retrograde flow of contrast from the right atrium into the IVC compatible with a degree of right cardiac dysfunction. Right hilar and subcarinal adenopathy. The esophagus and the thyroid gland are grossly unremarkable. There is no axillary adenopathy. The chest wall soft tissues appear unremarkable. Old healed bilateral rib fractures. No acute fracture. The visualized upper abdomen appear grossly unremarkable. Review of the  MIP images confirms the above findings. IMPRESSION: No CT evidence of pulmonary embolism. Emphysema with an area of consolidative change in the right lung base compatible with pneumonia. Small right pleural effusion. Diffuse dilatation of the ascending aorta and proximal arch measuring up to 5.2  cm. Evaluation of the aorta is limited on this study as timing of the contrast was designed to opacifying the pulmonary vasculature. No definite dissection identified. CT angiography with optimal opacification of the aorta may provide additional evaluation. Ascending thoracic aortic aneurysm. Recommend semi-annual imaging followup by CTA or MRA and referral to cardiothoracic surgery if not already obtained. This recommendation follows 2010 ACCF/AHA/AATS/ACR/ASA/SCA/SCAI/SIR/STS/SVM Guidelines for the Diagnosis and Management of Patients With Thoracic Aortic Disease. Circulation. 2010; 121: Z610-R604 Calcification of the aortic valves with probable resulting aortic valve stenosis. Dilatation of the ascending aorta may represent post stenotic aneurysm. There is also left ventricular hypertrophy and mild dilatation of the left atrium. Echocardiogram is recommended for further evaluation. Electronically Signed   By: Elgie Collard M.D.   On: 12/26/2015 21:19   Dg Chest Port 1 View  12/28/2015  CLINICAL DATA:  Acute respiratory failure. The patient extubated himself yesterday. EXAM: PORTABLE CHEST 1 VIEW COMPARISON:  Yesterday. FINDINGS: The cardiac silhouette remains borderline enlarged. Decreased bilateral airspace opacity. Stable bilateral pleural effusions and prominence of the pulmonary vasculature and interstitial markings. No acute bony abnormality. IMPRESSION: 1. Improving congestive heart failure superimposed on COPD. 2. Persistent probable atelectasis at the right lung base and mild left basilar atelectasis. Electronically Signed   By: Beckie Salts M.D.   On: 12/28/2015 07:14   Dg Chest Port 1 View  12/27/2015  CLINICAL DATA:  Intubated. EXAM: PORTABLE CHEST 1 VIEW COMPARISON:  Yesterday. FINDINGS: Borderline enlarged cardiac silhouette with a mild increase in size. Increased airspace opacity throughout the right lung and the majority of the left lung. Increased size of small bilateral pleural  effusions. Endotracheal tube in satisfactory position. Nasogastric tube tip in the mid stomach. Diffuse osteopenia. IMPRESSION: Worsening changes of congestive heart failure superimposed on COPD. Underlying pneumonia cannot be excluded. Electronically Signed   By: Beckie Salts M.D.   On: 12/27/2015 13:42   Dg Abd Portable 1v  12/27/2015  CLINICAL DATA:  Encounter for orogastric tube placement. EXAM: PORTABLE ABDOMEN - 1 VIEW COMPARISON:  None. FINDINGS: Oral/nasogastric tube passes below the diaphragm extending well into the stomach. Tip lies in the mid to distal stomach. Normal bowel gas pattern. IMPRESSION: Oral/nasogastric tube is well positioned extending well into the stomach. Electronically Signed   By: Amie Portland M.D.   On: 12/27/2015 13:44    ASSESSMENT AND PLAN  65 yo M w a h/o COPD, HTN (by record, though normotensive here, not on treatment), HLD (by record, though lipid panel unremarkable here, not on treatment), & heavy prior alcohol (12 pack per day x 30 years, quit 2004), & tobacco use (~150 year pack history, quit 2010) p/w 1 day of diffuse chest discomfort in the context of 6 days of fevers, coughing productive of "milky phlegm" & dyspnea, found to be hypoxic to 91% in the ED due to pneumonia. Found to have dilated ascending aortic aneurysm and severe AS. Intubated on 2/19, self extubated on 12/28/2015. Echo 2/20 showed EF 35-40%, with wall motion abnormality, severe AS, mild MR, 42mm aortic root, akinetic RV apex. Cath 2/21 showed 3v CAD with 50 ost LM. Plan for CABG/AVR/Aortic root replacement at the same time when R PNA resolve and stable from pulm perspective.  He has been having some PAF as well, amiodarone started.   1. PAF  - started on amiodarone PO  TID for 5 days, then transition to  daily. May use IV amiodarone during perioperative phase  - continue lovenox, CHA2DS2-Vasc score 2 (CAD, HTN)  - continue ASA, BB, ACEI, lipitor. Would not plan amiodarone long term,  hopefully can d/c 1-2 month after discharge if no recurrence, IV amiodarone if needed during perioperative phase.   2. NSTEMI with 3v CAD with 50% ost LM  - cath 12/29/2015 50% ost LM, diffuse 50% prox to mid LAD, 70% distal LAD, 70% prox LCx, 90% prox RCA  - seen by CT surgery, plan for CABG/aortic root replacement/AVR this Thursday  - plavix held, last dose 2/21. PFT showed severe obstructive disease. Per CT surgery, high risk, pulmonology agree but it is not prohibitive to surgery. Plan for OR tomorrow 3/2.  3. Severe AS  4. Dilated aortic root  5. 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 Agree with assessment and plan. Feels well; no chest pain. No dyspnea at rest. Decreased BS without wheezing. 2/6 systolic murmur. No edema. Plan for surgery tomorrow with CABG, AVR, and replacement of aortic root, ascending aorta and aortic arch with increased risk of prolonged ventilation.  Pt is committed to no smoking.   Lennette Bihari, MD, [facility] 01/06/2016 11:23 AM

## 2016-01-07 ENCOUNTER — Inpatient Hospital Stay (HOSPITAL_COMMUNITY): Payer: Medicaid Other

## 2016-01-07 ENCOUNTER — Inpatient Hospital Stay (HOSPITAL_COMMUNITY): Payer: Medicaid Other | Admitting: Anesthesiology

## 2016-01-07 ENCOUNTER — Encounter (HOSPITAL_COMMUNITY)
Admission: EM | Disposition: A | Discharge status: Rehabilitation Facility | Payer: Self-pay | Source: Home / Self Care | Attending: Surgery

## 2016-01-07 HISTORY — PX: THORACIC AORTIC ANEURYSM REPAIR: SHX799

## 2016-01-07 HISTORY — PX: TEE WITHOUT CARDIOVERSION: SHX5443

## 2016-01-07 HISTORY — PX: CORONARY ARTERY BYPASS GRAFT: SHX141

## 2016-01-07 LAB — POCT I-STAT 3, ART BLOOD GAS (G3+)
ACID-BASE EXCESS: 3 mmol/L — AB (ref 0.0–2.0)
ACID-BASE EXCESS: 4 mmol/L — AB (ref 0.0–2.0)
ACID-BASE EXCESS: 2 mmol/L (ref 0.0–2.0)
Acid-Base Excess: 2 mmol/L (ref 0.0–2.0)
BICARBONATE: 28.2 meq/L — AB (ref 20.0–24.0)
BICARBONATE: 29.6 meq/L — AB (ref 20.0–24.0)
BICARBONATE: 27.9 meq/L — AB (ref 20.0–24.0)
BICARBONATE: 28.8 meq/L — AB (ref 20.0–24.0)
Bicarbonate: 26.5 mEq/L — ABNORMAL HIGH (ref 20.0–24.0)
Bicarbonate: 27.1 mEq/L — ABNORMAL HIGH (ref 20.0–24.0)
O2 SAT: 100 %
O2 SAT: 95 %
O2 SAT: 97 %
O2 Saturation: 100 %
O2 Saturation: 100 %
O2 Saturation: 92 %
PCO2 ART: 44.7 mmHg (ref 35.0–45.0)
PCO2 ART: 48 mmHg — AB (ref 35.0–45.0)
PCO2 ART: 59.6 mmHg — AB (ref 35.0–45.0)
PCO2 ART: 64.7 mmHg — AB (ref 35.0–45.0)
PCO2 ART: 42.8 mmHg (ref 35.0–45.0)
PH ART: 7.336 — AB (ref 7.350–7.450)
PH ART: 7.407 (ref 7.350–7.450)
PH ART: 7.407 (ref 7.350–7.450)
PO2 ART: 354 mmHg — AB (ref 80.0–100.0)
PO2 ART: 73 mmHg — AB (ref 80.0–100.0)
PO2 ART: 107 mmHg — AB (ref 80.0–100.0)
PO2 ART: 92 mmHg (ref 80.0–100.0)
PRESSURE CONTROL: 1 cmH2O
Patient temperature: 41
Patient temperature: 36.5
TCO2: 28 mmol/L (ref 0–100)
TCO2: 30 mmol/L (ref 0–100)
TCO2: 31 mmol/L (ref 0–100)
TCO2: 30 mmol/L (ref 0–100)
TCO2: 30 mmol/L (ref 0–100)
TCO2: 28 mmol/L (ref 0–100)
pCO2 arterial: 49.5 mmHg — ABNORMAL HIGH (ref 35.0–45.0)
pH, Arterial: 7.397 (ref 7.350–7.450)
pH, Arterial: 7.276 — ABNORMAL LOW (ref 7.350–7.450)
pH, Arterial: 7.275 — ABNORMAL LOW (ref 7.350–7.450)
pO2, Arterial: 414 mmHg — ABNORMAL HIGH (ref 80.0–100.0)
pO2, Arterial: 454 mmHg — ABNORMAL HIGH (ref 80.0–100.0)

## 2016-01-07 LAB — POCT I-STAT 4, (NA,K, GLUC, HGB,HCT)
GLUCOSE: 150 mg/dL — AB (ref 65–99)
HEMATOCRIT: 31 % — AB (ref 39.0–52.0)
HEMOGLOBIN: 10.5 g/dL — AB (ref 13.0–17.0)
POTASSIUM: 4.7 mmol/L (ref 3.5–5.1)
SODIUM: 140 mmol/L (ref 135–145)

## 2016-01-07 LAB — POCT I-STAT, CHEM 8
BUN: 17 mg/dL (ref 6–20)
BUN: 15 mg/dL (ref 6–20)
BUN: 16 mg/dL (ref 6–20)
BUN: 13 mg/dL (ref 6–20)
BUN: 15 mg/dL (ref 6–20)
BUN: 15 mg/dL (ref 6–20)
BUN: 14 mg/dL (ref 6–20)
BUN: 14 mg/dL (ref 6–20)
BUN: 15 mg/dL (ref 6–20)
BUN: 15 mg/dL (ref 6–20)
CALCIUM ION: 1.23 mmol/L (ref 1.13–1.30)
CALCIUM ION: 0.9 mmol/L — AB (ref 1.13–1.30)
CALCIUM ION: 0.94 mmol/L — AB (ref 1.13–1.30)
CHLORIDE: 101 mmol/L (ref 101–111)
CHLORIDE: 100 mmol/L — AB (ref 101–111)
CHLORIDE: 98 mmol/L — AB (ref 101–111)
CHLORIDE: 100 mmol/L — AB (ref 101–111)
CHLORIDE: 102 mmol/L (ref 101–111)
CHLORIDE: 97 mmol/L — AB (ref 101–111)
CREATININE: 0.6 mg/dL — AB (ref 0.61–1.24)
Calcium, Ion: 1.24 mmol/L (ref 1.13–1.30)
Calcium, Ion: 1.28 mmol/L (ref 1.13–1.30)
Calcium, Ion: 0.86 mmol/L — ABNORMAL LOW (ref 1.13–1.30)
Calcium, Ion: 0.8 mmol/L — ABNORMAL LOW (ref 1.13–1.30)
Calcium, Ion: 0.81 mmol/L — ABNORMAL LOW (ref 1.13–1.30)
Calcium, Ion: 0.8 mmol/L — ABNORMAL LOW (ref 1.13–1.30)
Calcium, Ion: 0.84 mmol/L — ABNORMAL LOW (ref 1.13–1.30)
Chloride: 100 mmol/L — ABNORMAL LOW (ref 101–111)
Chloride: 98 mmol/L — ABNORMAL LOW (ref 101–111)
Chloride: 95 mmol/L — ABNORMAL LOW (ref 101–111)
Chloride: 97 mmol/L — ABNORMAL LOW (ref 101–111)
Creatinine, Ser: 0.7 mg/dL (ref 0.61–1.24)
Creatinine, Ser: 0.6 mg/dL — ABNORMAL LOW (ref 0.61–1.24)
Creatinine, Ser: 0.8 mg/dL (ref 0.61–1.24)
Creatinine, Ser: 0.6 mg/dL — ABNORMAL LOW (ref 0.61–1.24)
Creatinine, Ser: 0.5 mg/dL — ABNORMAL LOW (ref 0.61–1.24)
Creatinine, Ser: 0.5 mg/dL — ABNORMAL LOW (ref 0.61–1.24)
Creatinine, Ser: 0.4 mg/dL — ABNORMAL LOW (ref 0.61–1.24)
Creatinine, Ser: 0.6 mg/dL — ABNORMAL LOW (ref 0.61–1.24)
Creatinine, Ser: 0.6 mg/dL — ABNORMAL LOW (ref 0.61–1.24)
GLUCOSE: 103 mg/dL — AB (ref 65–99)
GLUCOSE: 105 mg/dL — AB (ref 65–99)
Glucose, Bld: 108 mg/dL — ABNORMAL HIGH (ref 65–99)
Glucose, Bld: 83 mg/dL (ref 65–99)
Glucose, Bld: 83 mg/dL (ref 65–99)
Glucose, Bld: 130 mg/dL — ABNORMAL HIGH (ref 65–99)
Glucose, Bld: 119 mg/dL — ABNORMAL HIGH (ref 65–99)
Glucose, Bld: 124 mg/dL — ABNORMAL HIGH (ref 65–99)
Glucose, Bld: 141 mg/dL — ABNORMAL HIGH (ref 65–99)
Glucose, Bld: 142 mg/dL — ABNORMAL HIGH (ref 65–99)
HCT: 33 % — ABNORMAL LOW (ref 39.0–52.0)
HCT: 34 % — ABNORMAL LOW (ref 39.0–52.0)
HCT: 18 % — ABNORMAL LOW (ref 39.0–52.0)
HCT: 20 % — ABNORMAL LOW (ref 39.0–52.0)
HEMATOCRIT: 32 % — AB (ref 39.0–52.0)
HEMATOCRIT: 23 % — AB (ref 39.0–52.0)
HEMATOCRIT: 25 % — AB (ref 39.0–52.0)
HEMATOCRIT: 24 % — AB (ref 39.0–52.0)
HEMATOCRIT: 22 % — AB (ref 39.0–52.0)
HEMATOCRIT: 26 % — AB (ref 39.0–52.0)
HEMOGLOBIN: 10.9 g/dL — AB (ref 13.0–17.0)
HEMOGLOBIN: 11.6 g/dL — AB (ref 13.0–17.0)
HEMOGLOBIN: 6.1 g/dL — AB (ref 13.0–17.0)
HEMOGLOBIN: 8.5 g/dL — AB (ref 13.0–17.0)
HEMOGLOBIN: 8.2 g/dL — AB (ref 13.0–17.0)
HEMOGLOBIN: 6.8 g/dL — AB (ref 13.0–17.0)
HEMOGLOBIN: 8.8 g/dL — AB (ref 13.0–17.0)
Hemoglobin: 11.2 g/dL — ABNORMAL LOW (ref 13.0–17.0)
Hemoglobin: 7.8 g/dL — ABNORMAL LOW (ref 13.0–17.0)
Hemoglobin: 7.5 g/dL — ABNORMAL LOW (ref 13.0–17.0)
POTASSIUM: 3.7 mmol/L (ref 3.5–5.1)
POTASSIUM: 5.3 mmol/L — AB (ref 3.5–5.1)
POTASSIUM: 5.4 mmol/L — AB (ref 3.5–5.1)
POTASSIUM: 4.4 mmol/L (ref 3.5–5.1)
Potassium: 3.7 mmol/L (ref 3.5–5.1)
Potassium: 3.7 mmol/L (ref 3.5–5.1)
Potassium: 4 mmol/L (ref 3.5–5.1)
Potassium: 5.9 mmol/L — ABNORMAL HIGH (ref 3.5–5.1)
Potassium: 6 mmol/L — ABNORMAL HIGH (ref 3.5–5.1)
Potassium: 5.2 mmol/L — ABNORMAL HIGH (ref 3.5–5.1)
SODIUM: 138 mmol/L (ref 135–145)
SODIUM: 138 mmol/L (ref 135–145)
SODIUM: 139 mmol/L (ref 135–145)
SODIUM: 130 mmol/L — AB (ref 135–145)
SODIUM: 135 mmol/L (ref 135–145)
SODIUM: 132 mmol/L — AB (ref 135–145)
SODIUM: 132 mmol/L — AB (ref 135–145)
SODIUM: 136 mmol/L (ref 135–145)
SODIUM: 139 mmol/L (ref 135–145)
Sodium: 139 mmol/L (ref 135–145)
TCO2: 27 mmol/L (ref 0–100)
TCO2: 27 mmol/L (ref 0–100)
TCO2: 30 mmol/L (ref 0–100)
TCO2: 31 mmol/L (ref 0–100)
TCO2: 35 mmol/L (ref 0–100)
TCO2: 30 mmol/L (ref 0–100)
TCO2: 30 mmol/L (ref 0–100)
TCO2: 29 mmol/L (ref 0–100)
TCO2: 31 mmol/L (ref 0–100)
TCO2: 32 mmol/L (ref 0–100)

## 2016-01-07 LAB — CBC
HCT: 36.5 % — ABNORMAL LOW (ref 39.0–52.0)
HEMATOCRIT: 28.1 % — AB (ref 39.0–52.0)
Hemoglobin: 11.8 g/dL — ABNORMAL LOW (ref 13.0–17.0)
Hemoglobin: 9.3 g/dL — ABNORMAL LOW (ref 13.0–17.0)
MCH: 31.9 pg (ref 26.0–34.0)
MCH: 30.2 pg (ref 26.0–34.0)
MCHC: 32.3 g/dL (ref 30.0–36.0)
MCHC: 33.1 g/dL (ref 30.0–36.0)
MCV: 98.6 fL (ref 78.0–100.0)
MCV: 91.2 fL (ref 78.0–100.0)
PLATELETS: 245 10*3/uL (ref 150–400)
PLATELETS: 173 10*3/uL (ref 150–400)
RBC: 3.7 MIL/uL — ABNORMAL LOW (ref 4.22–5.81)
RBC: 3.08 MIL/uL — AB (ref 4.22–5.81)
RDW: 13.3 % (ref 11.5–15.5)
RDW: 17.4 % — ABNORMAL HIGH (ref 11.5–15.5)
WBC: 11.1 10*3/uL — ABNORMAL HIGH (ref 4.0–10.5)
WBC: 22.6 10*3/uL — AB (ref 4.0–10.5)

## 2016-01-07 LAB — BASIC METABOLIC PANEL
Anion gap: 7 (ref 5–15)
BUN: 16 mg/dL (ref 6–20)
CHLORIDE: 103 mmol/L (ref 101–111)
CO2: 30 mmol/L (ref 22–32)
Calcium: 9.1 mg/dL (ref 8.9–10.3)
Creatinine, Ser: 1.14 mg/dL (ref 0.61–1.24)
GFR calc Af Amer: >60 mL/min (ref >60)
GFR calc non Af Amer: >60 mL/min (ref >60)
GLUCOSE: 100 mg/dL — AB (ref 65–99)
POTASSIUM: 5.1 mmol/L (ref 3.5–5.1)
Sodium: 140 mmol/L (ref 135–145)

## 2016-01-07 LAB — BLOOD GAS, ARTERIAL
Acid-Base Excess: 0.9 mmol/L (ref 0.0–2.0)
Bicarbonate: 25.4 mEq/L — ABNORMAL HIGH (ref 20.0–24.0)
Drawn by: 225631
FIO2: 0.21
O2 SAT: 90.2 %
PATIENT TEMPERATURE: 98.6
TCO2: 26.7 mmol/L (ref 0–100)
pCO2 arterial: 43.8 mmHg (ref 35.0–45.0)
pH, Arterial: 7.381 (ref 7.350–7.450)
pO2, Arterial: 62 mmHg — ABNORMAL LOW (ref 80.0–100.0)

## 2016-01-07 LAB — PLATELET COUNT: Platelets: 60 10*3/uL — ABNORMAL LOW (ref 150–400)

## 2016-01-07 LAB — GLUCOSE, CAPILLARY
Glucose-Capillary: 135 mg/dL — ABNORMAL HIGH (ref 65–99)
Glucose-Capillary: 147 mg/dL — ABNORMAL HIGH (ref 65–99)

## 2016-01-07 LAB — SURGICAL PCR SCREEN
MRSA, PCR: NEGATIVE
Staphylococcus aureus: NEGATIVE

## 2016-01-07 LAB — PREPARE RBC (CROSSMATCH)

## 2016-01-07 LAB — HEMOGLOBIN AND HEMATOCRIT, BLOOD
HEMATOCRIT: 26 % — AB (ref 39.0–52.0)
HEMOGLOBIN: 8.8 g/dL — AB (ref 13.0–17.0)

## 2016-01-07 LAB — PROTIME-INR
INR: 1.54 — ABNORMAL HIGH (ref 0.00–1.49)
Prothrombin Time: 18.5 seconds — ABNORMAL HIGH (ref 11.6–15.2)

## 2016-01-07 LAB — FIBRINOGEN
FIBRINOGEN: 112 mg/dL — AB (ref 204–475)
FIBRINOGEN: 195 mg/dL — AB (ref 204–475)

## 2016-01-07 LAB — APTT: APTT: 37 s (ref 24–37)

## 2016-01-07 LAB — MAGNESIUM: Magnesium: 1.9 mg/dL (ref 1.7–2.4)

## 2016-01-07 SURGERY — CORONARY ARTERY BYPASS GRAFTING (CABG)
Anesthesia: General | Site: Chest

## 2016-01-07 MED ORDER — MILRINONE IN DEXTROSE 20 MG/100ML IV SOLN
0.3000 ug/kg/min | INTRAVENOUS | Status: DC
  Administered 2016-01-08: 0.3 ug/kg/min via INTRAVENOUS
  Filled 2016-01-07 (×2): qty 100

## 2016-01-07 MED ORDER — MIDAZOLAM HCL 10 MG/2ML IJ SOLN
INTRAMUSCULAR | Status: AC
  Filled 2016-01-07: qty 2

## 2016-01-07 MED ORDER — LACTATED RINGERS IV SOLN: INTRAVENOUS | Status: DC

## 2016-01-07 MED ORDER — BISACODYL 10 MG RE SUPP: 10.0000 mg | Freq: Every day | RECTAL | Status: DC

## 2016-01-07 MED ORDER — SODIUM CHLORIDE 0.9 % IV SOLN
INTRAVENOUS | Status: DC
  Filled 2016-01-07: qty 40

## 2016-01-07 MED ORDER — DOCUSATE SODIUM 100 MG PO CAPS
200.0000 mg | ORAL_CAPSULE | Freq: Every day | ORAL | Status: DC
  Administered 2016-01-08 – 2016-01-10 (×3): 200 mg via ORAL
  Filled 2016-01-07 (×3): qty 2

## 2016-01-07 MED ORDER — METOPROLOL TARTRATE 12.5 MG HALF TABLET
12.5000 mg | ORAL_TABLET | Freq: Two times a day (BID) | ORAL | Status: DC
Start: 2016-01-07 — End: 2016-01-08

## 2016-01-07 MED ORDER — BISACODYL 5 MG PO TBEC
10.0000 mg | DELAYED_RELEASE_TABLET | Freq: Every day | ORAL | Status: DC
Start: 2016-01-08 — End: 2016-01-11
  Administered 2016-01-08 – 2016-01-10 (×3): 10 mg via ORAL
  Filled 2016-01-07 (×3): qty 2

## 2016-01-07 MED ORDER — MORPHINE SULFATE (PF) 2 MG/ML IV SOLN
1.0000 mg | INTRAVENOUS | Status: AC | PRN
  Administered 2016-01-08: 4 mg via INTRAVENOUS

## 2016-01-07 MED ORDER — METOPROLOL TARTRATE 25 MG/10 ML ORAL SUSPENSION: 12.5000 mg | Freq: Two times a day (BID) | ORAL | Status: DC

## 2016-01-07 MED ORDER — SODIUM CHLORIDE 0.45 % IV SOLN
INTRAVENOUS | Status: DC | PRN
  Administered 2016-01-07: 19:00:00 via INTRAVENOUS

## 2016-01-07 MED ORDER — SODIUM CHLORIDE 0.9 % IJ SOLN
INTRAMUSCULAR | Status: AC
  Filled 2016-01-07: qty 10

## 2016-01-07 MED ORDER — FENTANYL CITRATE (PF) 100 MCG/2ML IJ SOLN
INTRAMUSCULAR | Status: DC | PRN
  Administered 2016-01-07 (×3): 250 ug via INTRAVENOUS
  Administered 2016-01-07: 50 ug via INTRAVENOUS
  Administered 2016-01-07: 200 ug via INTRAVENOUS

## 2016-01-07 MED ORDER — ROCURONIUM BROMIDE 50 MG/5ML IV SOLN
INTRAVENOUS | Status: AC
  Filled 2016-01-07: qty 1

## 2016-01-07 MED ORDER — PANTOPRAZOLE SODIUM 40 MG PO TBEC
40.0000 mg | DELAYED_RELEASE_TABLET | Freq: Every day | ORAL | Status: DC
  Administered 2016-01-09 – 2016-01-10 (×2): 40 mg via ORAL
  Filled 2016-01-07 (×2): qty 1

## 2016-01-07 MED ORDER — 0.9 % SODIUM CHLORIDE (POUR BTL) OPTIME
TOPICAL | Status: DC | PRN
  Administered 2016-01-07: 6000 mL

## 2016-01-07 MED ORDER — LACTATED RINGERS IV SOLN
INTRAVENOUS | Status: DC | PRN
  Administered 2016-01-07: 07:00:00 via INTRAVENOUS

## 2016-01-07 MED ORDER — MIDAZOLAM HCL 5 MG/5ML IJ SOLN
INTRAMUSCULAR | Status: DC | PRN
  Administered 2016-01-07: 2 mg via INTRAVENOUS
  Administered 2016-01-07: 5 mg via INTRAVENOUS
  Administered 2016-01-07: 1 mg via INTRAVENOUS

## 2016-01-07 MED ORDER — ROCURONIUM BROMIDE 50 MG/5ML IV SOLN
INTRAVENOUS | Status: AC
  Filled 2016-01-07: qty 2

## 2016-01-07 MED ORDER — INSULIN REGULAR BOLUS VIA INFUSION
0.0000 [IU] | Freq: Three times a day (TID) | INTRAVENOUS | Status: DC
  Filled 2016-01-07: qty 10

## 2016-01-07 MED ORDER — LIDOCAINE HCL (CARDIAC) 20 MG/ML IV SOLN
INTRAVENOUS | Status: DC | PRN
  Administered 2016-01-07: 50 mg via INTRAVENOUS

## 2016-01-07 MED ORDER — SODIUM CHLORIDE 0.9 % IV SOLN: Freq: Once | INTRAVENOUS | Status: DC

## 2016-01-07 MED ORDER — DEXMEDETOMIDINE HCL IN NACL 200 MCG/50ML IV SOLN
0.0000 ug/kg/h | INTRAVENOUS | Status: DC
Start: 2016-01-07 — End: 2016-01-10
  Administered 2016-01-07 – 2016-01-08 (×3): 0.5 ug/kg/h via INTRAVENOUS
  Filled 2016-01-07: qty 50

## 2016-01-07 MED ORDER — SODIUM CHLORIDE 0.9 % IV SOLN
INTRAVENOUS | Status: DC
  Administered 2016-01-07: 1.5 [IU]/h via INTRAVENOUS
  Filled 2016-01-07: qty 2.5

## 2016-01-07 MED ORDER — PROPOFOL 10 MG/ML IV BOLUS
INTRAVENOUS | Status: DC | PRN
  Administered 2016-01-07: 50 mg via INTRAVENOUS
  Administered 2016-01-07: 100 mg via INTRAVENOUS
  Administered 2016-01-07: 70 mg via INTRAVENOUS

## 2016-01-07 MED ORDER — MORPHINE SULFATE (PF) 2 MG/ML IV SOLN
2.0000 mg | INTRAVENOUS | Status: DC | PRN
  Administered 2016-01-08 – 2016-01-10 (×2): 4 mg via INTRAVENOUS
  Filled 2016-01-07 (×3): qty 2

## 2016-01-07 MED ORDER — ACETAMINOPHEN 500 MG PO TABS
1000.0000 mg | ORAL_TABLET | Freq: Four times a day (QID) | ORAL | Status: DC
  Administered 2016-01-08 – 2016-01-10 (×7): 1000 mg via ORAL
  Filled 2016-01-07 (×7): qty 2

## 2016-01-07 MED ORDER — ASPIRIN 81 MG PO CHEW: 324.0000 mg | CHEWABLE_TABLET | Freq: Every day | ORAL | Status: DC

## 2016-01-07 MED ORDER — PROTAMINE SULFATE 10 MG/ML IV SOLN
INTRAVENOUS | Status: AC
  Filled 2016-01-07: qty 25

## 2016-01-07 MED ORDER — ROCURONIUM BROMIDE 100 MG/10ML IV SOLN
INTRAVENOUS | Status: DC | PRN
  Administered 2016-01-07: 40 mg via INTRAVENOUS
  Administered 2016-01-07: 50 mg via INTRAVENOUS
  Administered 2016-01-07: 40 mg via INTRAVENOUS
  Administered 2016-01-07 (×2): 50 mg via INTRAVENOUS

## 2016-01-07 MED ORDER — HEMOSTATIC AGENTS (NO CHARGE) OPTIME
TOPICAL | Status: DC | PRN
  Administered 2016-01-07 (×2): 1 via TOPICAL

## 2016-01-07 MED ORDER — MIDAZOLAM HCL 2 MG/2ML IJ SOLN
2.0000 mg | INTRAMUSCULAR | Status: DC | PRN
  Administered 2016-01-07 (×2): 2 mg via INTRAVENOUS
  Filled 2016-01-07 (×2): qty 2

## 2016-01-07 MED ORDER — MILRINONE IN DEXTROSE 20 MG/100ML IV SOLN
INTRAVENOUS | Status: DC | PRN
  Administered 2016-01-07: .3 ug/kg/min via INTRAVENOUS

## 2016-01-07 MED ORDER — TRAMADOL HCL 50 MG PO TABS: 50.0000 mg | ORAL_TABLET | ORAL | Status: DC | PRN

## 2016-01-07 MED ORDER — VANCOMYCIN HCL IN DEXTROSE 1-5 GM/200ML-% IV SOLN
1000.0000 mg | Freq: Once | INTRAVENOUS | Status: AC
  Administered 2016-01-07: 1000 mg via INTRAVENOUS
  Filled 2016-01-07: qty 200

## 2016-01-07 MED ORDER — NITROPRUSSIDE SODIUM 25 MG/ML IV SOLN
0.0000 ug/kg/min | INTRAVENOUS | Status: DC
  Filled 2016-01-07: qty 2

## 2016-01-07 MED ORDER — NITROPRUSSIDE SODIUM 25 MG/ML IV SOLN
50000.0000 ug | INTRAVENOUS | Status: DC | PRN
  Administered 2016-01-07: .5 ug/kg/min via INTRAVENOUS

## 2016-01-07 MED ORDER — ONDANSETRON HCL 4 MG/2ML IJ SOLN: 4.0000 mg | Freq: Four times a day (QID) | INTRAMUSCULAR | Status: DC | PRN

## 2016-01-07 MED ORDER — NITROGLYCERIN 0.2 MG/ML ON CALL CATH LAB
INTRAVENOUS | Status: DC | PRN
  Administered 2016-01-07 (×2): 80 ug via INTRAVENOUS

## 2016-01-07 MED ORDER — HEMOSTATIC AGENTS (NO CHARGE) OPTIME
TOPICAL | Status: DC | PRN
  Administered 2016-01-07: 1 via TOPICAL

## 2016-01-07 MED ORDER — OXYCODONE HCL 5 MG PO TABS
5.0000 mg | ORAL_TABLET | ORAL | Status: DC | PRN
  Administered 2016-01-08 – 2016-01-10 (×4): 10 mg via ORAL
  Administered 2016-01-11: 5 mg via ORAL
  Filled 2016-01-07 (×2): qty 2
  Filled 2016-01-07: qty 1
  Filled 2016-01-07 (×2): qty 2

## 2016-01-07 MED ORDER — PROPOFOL 10 MG/ML IV BOLUS
INTRAVENOUS | Status: AC
  Filled 2016-01-07: qty 20

## 2016-01-07 MED ORDER — THROMBIN 20000 UNITS EX SOLR
CUTANEOUS | Status: DC | PRN
  Administered 2016-01-07: 20000 [IU] via TOPICAL

## 2016-01-07 MED ORDER — HEPARIN SODIUM (PORCINE) 1000 UNIT/ML IJ SOLN
INTRAMUSCULAR | Status: DC | PRN
  Administered 2016-01-07: 21000 [IU] via INTRAVENOUS
  Administered 2016-01-07: 5000 [IU] via INTRAVENOUS

## 2016-01-07 MED ORDER — FENTANYL CITRATE (PF) 250 MCG/5ML IJ SOLN
INTRAMUSCULAR | Status: AC
  Filled 2016-01-07: qty 30

## 2016-01-07 MED ORDER — SODIUM CHLORIDE 0.9 % IV SOLN: 250.0000 mL | INTRAVENOUS | Status: DC

## 2016-01-07 MED ORDER — METOPROLOL TARTRATE 1 MG/ML IV SOLN: 2.5000 mg | INTRAVENOUS | Status: DC | PRN

## 2016-01-07 MED ORDER — DEXTROSE 5 % IV SOLN
1.5000 g | Freq: Two times a day (BID) | INTRAVENOUS | Status: AC
  Administered 2016-01-07 – 2016-01-09 (×4): 1.5 g via INTRAVENOUS
  Filled 2016-01-07 (×4): qty 1.5

## 2016-01-07 MED ORDER — METHYLPREDNISOLONE SODIUM SUCC 125 MG IJ SOLR
INTRAMUSCULAR | Status: DC | PRN
  Administered 2016-01-07: 250 mg via INTRAVENOUS

## 2016-01-07 MED ORDER — PHENYLEPHRINE HCL 10 MG/ML IJ SOLN
0.0000 ug/min | INTRAVENOUS | Status: DC
  Administered 2016-01-07: 30 ug/min via INTRAVENOUS
  Filled 2016-01-07: qty 2

## 2016-01-07 MED ORDER — CALCIUM CHLORIDE 10 % IV SOLN
INTRAVENOUS | Status: DC | PRN
  Administered 2016-01-07 (×2): 300 mg via INTRAVENOUS

## 2016-01-07 MED ORDER — LACTATED RINGERS IV SOLN: 500.0000 mL | Freq: Once | INTRAVENOUS | Status: DC | PRN

## 2016-01-07 MED ORDER — NITROGLYCERIN IN D5W 200-5 MCG/ML-% IV SOLN: 0.0000 ug/min | INTRAVENOUS | Status: DC

## 2016-01-07 MED ORDER — THROMBIN 20000 UNITS EX SOLR
CUTANEOUS | Status: AC
  Filled 2016-01-07: qty 20000

## 2016-01-07 MED ORDER — ACETAMINOPHEN 160 MG/5ML PO SOLN: 650.0000 mg | Freq: Once | ORAL | Status: AC

## 2016-01-07 MED ORDER — SODIUM CHLORIDE 0.9 % IV SOLN
INTRAVENOUS | Status: DC
  Administered 2016-01-07: 19:00:00 via INTRAVENOUS

## 2016-01-07 MED ORDER — ALBUMIN HUMAN 5 % IV SOLN
INTRAVENOUS | Status: DC | PRN
  Administered 2016-01-07: 17:00:00 via INTRAVENOUS

## 2016-01-07 MED ORDER — CHLORHEXIDINE GLUCONATE 0.12 % MT SOLN
15.0000 mL | OROMUCOSAL | Status: AC
  Administered 2016-01-07: 15 mL via OROMUCOSAL

## 2016-01-07 MED ORDER — DOPAMINE-DEXTROSE 3.2-5 MG/ML-% IV SOLN
0.0000 ug/kg/min | INTRAVENOUS | Status: DC
  Administered 2016-01-07: 5 ug/kg/min via INTRAVENOUS

## 2016-01-07 MED ORDER — SUCCINYLCHOLINE CHLORIDE 20 MG/ML IJ SOLN
INTRAMUSCULAR | Status: AC
  Filled 2016-01-07: qty 1

## 2016-01-07 MED ORDER — THROMBIN 20000 UNITS EX SOLR
OROMUCOSAL | Status: DC | PRN
  Administered 2016-01-07 (×3): 4 mL via TOPICAL

## 2016-01-07 MED ORDER — SODIUM CHLORIDE 0.9% FLUSH: 3.0000 mL | INTRAVENOUS | Status: DC | PRN

## 2016-01-07 MED ORDER — SODIUM CHLORIDE 0.9% FLUSH
3.0000 mL | Freq: Two times a day (BID) | INTRAVENOUS | Status: DC
  Administered 2016-01-08 – 2016-01-18 (×12): 3 mL via INTRAVENOUS
  Administered 2016-01-19: 10 mL via INTRAVENOUS
  Administered 2016-01-20 – 2016-01-29 (×18): 3 mL via INTRAVENOUS

## 2016-01-07 MED ORDER — ASPIRIN EC 325 MG PO TBEC
325.0000 mg | DELAYED_RELEASE_TABLET | Freq: Every day | ORAL | Status: DC
Start: 2016-01-08 — End: 2016-01-10
  Administered 2016-01-08 – 2016-01-10 (×3): 325 mg via ORAL
  Filled 2016-01-07 (×3): qty 1

## 2016-01-07 MED ORDER — ALBUMIN HUMAN 5 % IV SOLN
250.0000 mL | INTRAVENOUS | Status: AC | PRN
Start: 2016-01-07 — End: 2016-01-08
  Administered 2016-01-07: 250 mL via INTRAVENOUS

## 2016-01-07 MED ORDER — ETOMIDATE 2 MG/ML IV SOLN
INTRAVENOUS | Status: DC | PRN
  Administered 2016-01-07: 14 mg via INTRAVENOUS

## 2016-01-07 MED ORDER — ANTISEPTIC ORAL RINSE SOLUTION (CORINZ)
7.0000 mL | OROMUCOSAL | Status: DC
  Administered 2016-01-07 – 2016-01-08 (×7): 7 mL via OROMUCOSAL

## 2016-01-07 MED ORDER — ACETAMINOPHEN 650 MG RE SUPP
650.0000 mg | Freq: Once | RECTAL | Status: AC
  Administered 2016-01-07: 650 mg via RECTAL

## 2016-01-07 MED ORDER — FAMOTIDINE IN NACL 20-0.9 MG/50ML-% IV SOLN
20.0000 mg | Freq: Two times a day (BID) | INTRAVENOUS | Status: AC
  Administered 2016-01-07 – 2016-01-08 (×2): 20 mg via INTRAVENOUS
  Filled 2016-01-07: qty 50

## 2016-01-07 MED ORDER — HEPARIN SODIUM (PORCINE) 1000 UNIT/ML IJ SOLN
INTRAMUSCULAR | Status: AC
  Filled 2016-01-07: qty 1

## 2016-01-07 MED ORDER — CALCIUM CHLORIDE 10 % IV SOLN
INTRAVENOUS | Status: AC
  Filled 2016-01-07: qty 20

## 2016-01-07 MED ORDER — ACETAMINOPHEN 160 MG/5ML PO SOLN
1000.0000 mg | Freq: Four times a day (QID) | ORAL | Status: DC
  Administered 2016-01-07 – 2016-01-08 (×2): 1000 mg
  Filled 2016-01-07 (×2): qty 40.6

## 2016-01-07 MED ORDER — POTASSIUM CHLORIDE 10 MEQ/50ML IV SOLN: 10.0000 meq | INTRAVENOUS | Status: AC

## 2016-01-07 MED ORDER — MAGNESIUM SULFATE 4 GM/100ML IV SOLN
4.0000 g | Freq: Once | INTRAVENOUS | Status: AC
  Administered 2016-01-07: 4 g via INTRAVENOUS
  Filled 2016-01-07: qty 100

## 2016-01-07 MED ORDER — 0.9 % SODIUM CHLORIDE (POUR BTL) OPTIME
TOPICAL | Status: DC | PRN
  Administered 2016-01-07: 1000 mL

## 2016-01-07 MED ORDER — MILRINONE IN DEXTROSE 20 MG/100ML IV SOLN
0.1250 ug/kg/min | INTRAVENOUS | Status: DC
  Filled 2016-01-07: qty 100

## 2016-01-07 MED ORDER — ARTIFICIAL TEARS OP OINT
TOPICAL_OINTMENT | OPHTHALMIC | Status: DC | PRN
  Administered 2016-01-07: 1 via OPHTHALMIC

## 2016-01-07 MED ORDER — EPHEDRINE SULFATE 50 MG/ML IJ SOLN
INTRAMUSCULAR | Status: AC
  Filled 2016-01-07: qty 1

## 2016-01-07 MED ORDER — DEXMEDETOMIDINE HCL IN NACL 200 MCG/50ML IV SOLN
INTRAVENOUS | Status: AC
  Filled 2016-01-07: qty 50

## 2016-01-07 MED ORDER — GLYCOPYRROLATE 0.2 MG/ML IJ SOLN
INTRAMUSCULAR | Status: DC | PRN
  Administered 2016-01-07 (×2): .2 mg via INTRAVENOUS

## 2016-01-07 MED ORDER — ARTIFICIAL TEARS OP OINT
TOPICAL_OINTMENT | OPHTHALMIC | Status: AC
  Filled 2016-01-07: qty 3.5

## 2016-01-07 SURGICAL SUPPLY — 152 items
ADAPTER CARDIO PERF ANTE/RETRO (ADAPTER) ×4 IMPLANT
ADH SKN CLS APL DERMABOND .7 (GAUZE/BANDAGES/DRESSINGS) ×2
ADPR PRFSN 84XANTGRD RTRGD (ADAPTER) ×2
APL SRG 7X2 LUM MLBL SLNT (VASCULAR PRODUCTS) ×6
APPLICATOR TIP COSEAL (VASCULAR PRODUCTS) ×6 IMPLANT
ATTRACTOMAT 16X20 MAGNETIC DRP (DRAPES) ×1 IMPLANT
BAG DECANTER FOR FLEXI CONT (MISCELLANEOUS) ×4 IMPLANT
BANDAGE ACE 4X5 VEL STRL LF (GAUZE/BANDAGES/DRESSINGS) ×4 IMPLANT
BANDAGE ACE 6X5 VEL STRL LF (GAUZE/BANDAGES/DRESSINGS) ×4 IMPLANT
BANDAGE ELASTIC 4 VELCRO ST LF (GAUZE/BANDAGES/DRESSINGS) ×4 IMPLANT
BANDAGE ELASTIC 6 VELCRO ST LF (GAUZE/BANDAGES/DRESSINGS) ×4 IMPLANT
BASKET HEART  (ORDER IN 25'S) (MISCELLANEOUS) ×1
BASKET HEART (ORDER IN 25'S) (MISCELLANEOUS) ×1
BASKET HEART (ORDER IN 25S) (MISCELLANEOUS) ×2 IMPLANT
BLADE STERNUM SYSTEM 6 (BLADE) ×4 IMPLANT
BLADE SURG 11 STRL SS (BLADE) ×2 IMPLANT
BLADE SURG 15 STRL LF DISP TIS (BLADE) ×2 IMPLANT
BLADE SURG 15 STRL SS (BLADE) ×4
BNDG GAUZE ELAST 4 BULKY (GAUZE/BANDAGES/DRESSINGS) ×7 IMPLANT
CANISTER SUCTION 2500CC (MISCELLANEOUS) ×4 IMPLANT
CANNULA GUNDRY RCSP 15FR (MISCELLANEOUS) ×6 IMPLANT
CATH ROBINSON RED A/P 18FR (CATHETERS) ×14 IMPLANT
CATH THORACIC 28FR (CATHETERS) ×4 IMPLANT
CATH THORACIC 36FR (CATHETERS) ×4 IMPLANT
CATH THORACIC 36FR RT ANG (CATHETERS) ×4 IMPLANT
CAUTERY HIGH TEMP VAS (MISCELLANEOUS) ×4 IMPLANT
CLIP TI MEDIUM 24 (CLIP) IMPLANT
CLIP TI WIDE RED SMALL 24 (CLIP) ×6 IMPLANT
CONN 3/8X3/8 GISH STERILE (MISCELLANEOUS) ×2 IMPLANT
CONN ST 1/4X3/8  BEN (MISCELLANEOUS) ×2
CONN ST 1/4X3/8 BEN (MISCELLANEOUS) ×1 IMPLANT
CONN Y 3/8X3/8X3/8  BEN (MISCELLANEOUS) ×2
CONN Y 3/8X3/8X3/8 BEN (MISCELLANEOUS) IMPLANT
CONT SPEC 4OZ CLIKSEAL STRL BL (MISCELLANEOUS) ×4 IMPLANT
CONT SPEC STER OR (MISCELLANEOUS) ×4 IMPLANT
COVER MAYO STAND STRL (DRAPES) ×4 IMPLANT
COVER SURGICAL LIGHT HANDLE (MISCELLANEOUS) ×2 IMPLANT
CRADLE DONUT ADULT HEAD (MISCELLANEOUS) ×4 IMPLANT
DERMABOND ADVANCED (GAUZE/BANDAGES/DRESSINGS) ×2
DERMABOND ADVANCED .7 DNX12 (GAUZE/BANDAGES/DRESSINGS) ×1 IMPLANT
DRAPE CARDIOVASCULAR INCISE (DRAPES) ×4
DRAPE SLUSH/WARMER DISC (DRAPES) ×4 IMPLANT
DRAPE SRG 135X102X78XABS (DRAPES) ×2 IMPLANT
DRSG COVADERM 4X14 (GAUZE/BANDAGES/DRESSINGS) ×4 IMPLANT
ELECT CAUTERY BLADE 6.4 (BLADE) ×4 IMPLANT
ELECT REM PT RETURN 9FT ADLT (ELECTROSURGICAL) ×8
ELECTRODE REM PT RTRN 9FT ADLT (ELECTROSURGICAL) ×4 IMPLANT
FELT TEFLON 1X6 (MISCELLANEOUS) ×4 IMPLANT
GAUZE SPONGE 4X4 12PLY STRL (GAUZE/BANDAGES/DRESSINGS) ×11 IMPLANT
GLOVE BIO SURGEON STRL SZ 6 (GLOVE) ×4 IMPLANT
GLOVE BIO SURGEON STRL SZ 6.5 (GLOVE) ×2 IMPLANT
GLOVE BIO SURGEON STRL SZ7 (GLOVE) ×4 IMPLANT
GLOVE BIO SURGEON STRL SZ7.5 (GLOVE) IMPLANT
GLOVE BIO SURGEONS STRL SZ 6.5 (GLOVE) ×2
GLOVE BIOGEL PI IND STRL 6 (GLOVE) ×2 IMPLANT
GLOVE BIOGEL PI IND STRL 6.5 (GLOVE) ×1 IMPLANT
GLOVE BIOGEL PI IND STRL 7.0 (GLOVE) ×1 IMPLANT
GLOVE BIOGEL PI INDICATOR 6 (GLOVE) ×4
GLOVE BIOGEL PI INDICATOR 6.5 (GLOVE) ×2
GLOVE BIOGEL PI INDICATOR 7.0 (GLOVE) ×2
GLOVE EUDERMIC 7 POWDERFREE (GLOVE) ×10 IMPLANT
GLOVE ORTHO TXT STRL SZ7.5 (GLOVE) IMPLANT
GOWN STRL REUS W/ TWL LRG LVL3 (GOWN DISPOSABLE) ×10 IMPLANT
GOWN STRL REUS W/ TWL XL LVL3 (GOWN DISPOSABLE) ×2 IMPLANT
GOWN STRL REUS W/TWL LRG LVL3 (GOWN DISPOSABLE) ×24
GOWN STRL REUS W/TWL XL LVL3 (GOWN DISPOSABLE) ×4
GRAFT HEMASHIELD 30X10 (Vascular Products) ×2 IMPLANT
GRAFT HEMASHIELD 8MM (Vascular Products) ×4 IMPLANT
GRAFT VASC STRG 30X8KNIT (Vascular Products) IMPLANT
HEART VENT LT CURVED (MISCELLANEOUS) ×4 IMPLANT
HEMOSTAT POWDER SURGIFOAM 1G (HEMOSTASIS) ×12 IMPLANT
HEMOSTAT SURGICEL 2X14 (HEMOSTASIS) ×10 IMPLANT
INSERT FOGARTY 61MM (MISCELLANEOUS) IMPLANT
INSERT FOGARTY SM (MISCELLANEOUS) ×4 IMPLANT
INSERT FOGARTY XLG (MISCELLANEOUS) ×4 IMPLANT
KIT BASIN OR (CUSTOM PROCEDURE TRAY) ×4 IMPLANT
KIT CATH CPB BARTLE (MISCELLANEOUS) ×4 IMPLANT
KIT ROOM TURNOVER OR (KITS) ×4 IMPLANT
KIT SUCTION CATH 14FR (SUCTIONS) ×4 IMPLANT
KIT TOURNIQUET VASCULAR (KITS) ×4 IMPLANT
KIT VASOVIEW W/TROCAR VH 2000 (KITS) ×4 IMPLANT
LINE VENT (MISCELLANEOUS) ×2 IMPLANT
LOOP VESSEL MAXI BLUE (MISCELLANEOUS) ×2 IMPLANT
LOOP VESSEL SUPERMAXI WHITE (MISCELLANEOUS) ×2 IMPLANT
NDL HYPO 25GX1X1/2 BEV (NEEDLE) IMPLANT
NEEDLE HYPO 25GX1X1/2 BEV (NEEDLE) ×4 IMPLANT
NS IRRIG 1000ML POUR BTL (IV SOLUTION) ×22 IMPLANT
PACK OPEN HEART (CUSTOM PROCEDURE TRAY) ×4 IMPLANT
PAD ARMBOARD 7.5X6 YLW CONV (MISCELLANEOUS) ×8 IMPLANT
PAD ELECT DEFIB RADIOL ZOLL (MISCELLANEOUS) ×4 IMPLANT
PENCIL BUTTON HOLSTER BLD 10FT (ELECTRODE) ×4 IMPLANT
PUNCH AORTIC ROTATE 4.0MM (MISCELLANEOUS) IMPLANT
PUNCH AORTIC ROTATE 4.5MM 8IN (MISCELLANEOUS) ×4 IMPLANT
PUNCH AORTIC ROTATE 5MM 8IN (MISCELLANEOUS) IMPLANT
SEALANT SURG COSEAL 8ML (VASCULAR PRODUCTS) ×2 IMPLANT
SET CARDIOPLEGIA MPS 5001102 (MISCELLANEOUS) ×3 IMPLANT
SET VEIN GRAFT PERF (SET/KITS/TRAYS/PACK) ×3 IMPLANT
SPONGE INTESTINAL PEANUT (DISPOSABLE) IMPLANT
SPONGE LAP 18X18 X RAY DECT (DISPOSABLE) ×6 IMPLANT
SPONGE LAP 4X18 X RAY DECT (DISPOSABLE) ×4 IMPLANT
STOPCOCK 4 WAY LG BORE MALE ST (IV SETS) ×2 IMPLANT
SUT BONE WAX W31G (SUTURE) ×4 IMPLANT
SUT ETHIBON 2 0 V 52N 30 (SUTURE) ×12 IMPLANT
SUT ETHIBON EXCEL 2-0 V-5 (SUTURE) IMPLANT
SUT ETHIBOND 2 0 SH (SUTURE) ×12
SUT ETHIBOND 2 0 SH 36X2 (SUTURE) IMPLANT
SUT ETHIBOND V-5 VALVE (SUTURE) IMPLANT
SUT MNCRL AB 4-0 PS2 18 (SUTURE) ×3 IMPLANT
SUT PROLENE 3 0 SH 1 (SUTURE) ×4 IMPLANT
SUT PROLENE 3 0 SH 48 (SUTURE) ×6 IMPLANT
SUT PROLENE 3 0 SH DA (SUTURE) ×15 IMPLANT
SUT PROLENE 3 0 SH1 36 (SUTURE) ×7 IMPLANT
SUT PROLENE 4 0 RB 1 (SUTURE) ×24
SUT PROLENE 4 0 SH DA (SUTURE) IMPLANT
SUT PROLENE 4-0 RB1 .5 CRCL 36 (SUTURE) ×8 IMPLANT
SUT PROLENE 5 0 C 1 36 (SUTURE) ×4 IMPLANT
SUT PROLENE 5 0 RB 2 (SUTURE) ×8 IMPLANT
SUT PROLENE 6 0 C 1 30 (SUTURE) IMPLANT
SUT PROLENE 7 0 BV 1 (SUTURE) ×2 IMPLANT
SUT PROLENE 7 0 BV1 MDA (SUTURE) ×4 IMPLANT
SUT PROLENE 8 0 BV175 6 (SUTURE) IMPLANT
SUT SILK  1 MH (SUTURE) ×2
SUT SILK 1 MH (SUTURE) IMPLANT
SUT SILK 2 0 SH CR/8 (SUTURE) ×2 IMPLANT
SUT SILK 3 0 TIES 10X30 (SUTURE) ×3 IMPLANT
SUT STEEL 6MS V (SUTURE) ×4 IMPLANT
SUT STEEL STERNAL CCS#1 18IN (SUTURE) IMPLANT
SUT STEEL SZ 6 DBL 3X14 BALL (SUTURE) IMPLANT
SUT VIC AB 1 CTX 36 (SUTURE) ×8
SUT VIC AB 1 CTX36XBRD ANBCTR (SUTURE) ×4 IMPLANT
SUT VIC AB 2-0 CT1 27 (SUTURE) ×4
SUT VIC AB 2-0 CT1 TAPERPNT 27 (SUTURE) IMPLANT
SUT VIC AB 2-0 CTX 27 (SUTURE) IMPLANT
SUT VIC AB 2-0 SH 27 (SUTURE) ×4
SUT VIC AB 2-0 SH 27XBRD (SUTURE) ×1 IMPLANT
SUT VIC AB 3-0 SH 27 (SUTURE) ×4
SUT VIC AB 3-0 SH 27X BRD (SUTURE) ×1 IMPLANT
SUT VIC AB 3-0 X1 27 (SUTURE) ×2 IMPLANT
SUT VICRYL 4-0 PS2 18IN ABS (SUTURE) IMPLANT
SUTURE E-PAK OPEN HEART (SUTURE) ×4 IMPLANT
SYR CONTROL 10ML LL (SYRINGE) ×3 IMPLANT
SYSTEM SAHARA CHEST DRAIN ATS (WOUND CARE) ×4 IMPLANT
TOWEL OR 17X24 6PK STRL BLUE (TOWEL DISPOSABLE) ×7 IMPLANT
TOWEL OR 17X26 10 PK STRL BLUE (TOWEL DISPOSABLE) ×7 IMPLANT
TRAY CATH LUMEN 1 20CM STRL (SET/KITS/TRAYS/PACK) ×2 IMPLANT
TRAY FOLEY IC TEMP SENS 14FR (CATHETERS) ×2 IMPLANT
TRAY FOLEY IC TEMP SENS 16FR (CATHETERS) ×4 IMPLANT
TUBING ART PRESS 48 MALE/FEM (TUBING) ×4 IMPLANT
TUBING INSUFFLATION (TUBING) ×4 IMPLANT
UNDERPAD 30X30 INCONTINENT (UNDERPADS AND DIAPERS) ×4 IMPLANT
VALVE MAGNA EASE AORTIC 25MM (Prosthesis & Implant Heart) ×2 IMPLANT
WATER STERILE IRR 1000ML POUR (IV SOLUTION) ×8 IMPLANT

## 2016-01-07 NOTE — Progress Notes (Signed)
  Echocardiogram Echocardiogram Transesophageal has been performed.  Craig [name] M 01/07/2016, 8:21 AM 

## 2016-01-07 NOTE — Op Note (Signed)
CARDIOVASCULAR SURGERY OPERATIVE NOTE  01/07/2016  Surgeon:  Alleen Borne, MD  First Assistant: Doree Fudge,  PA-C   Preoperative Diagnosis:  Left main and severe multivessel coronary artery disease, bicuspid aortic valve with severe aortic stenosis, ascending aortic and arch aneurysm.   Postoperative Diagnosis:  Same   Procedure:  1. Median Sternotomy 2. Extracorporeal circulation 3.   Coronary artery bypass graft surgery x 4 4.   Endoscopic vein harvest from both legs. 5.   Aortic valve replacement using a 25 mm Edwards Magna-ease pericardial valve. 6.   Supra-coronary ascending aortic and hemi-arch replacement using a 30 mm Hemashield graft under deep hypothermic circulatory arrest. 7.   Right axillary artery 8 mm Hemashield graft for arterial cannulation and antegrade cerebral perfusion . 8.   Insertion of left femoral arterial line.  Anesthesia:  General Endotracheal   Clinical History/Surgical Indication:  The patient is a 65 year old gentleman with a long heavy smoking history until 2010, COPD, hypertension and hyperlipidemia who reports being in his usual state of health with chronic exertional dyspnea and occasional episodes of dull chest aching until Monday a week ago. He then started having fever, chills, cough productive of white sputum and dyspnea. This continued to progress over the week and his sputum turned pink. He tried breathing treatments at home without relief of symptoms. He began having chest discomfort and on Friday the severity of his symptoms prompted a visit to the ER. His CTA in the ER showed no PE. It did show significant emphysematous changes and consolidation in the right lung base as well as an area of interstitial thickening in the right apical region concerning for pneumonia. There was also diffuse dilation of the ascending aorta with involvement of the aortic arch with a maximum diameter of 5.2 cm. No contrast was seen in the aorta. His  initial troponin was 0.91, then 2.32. EKG demonstrated variable sinus tachycardia & ectopic atrial tachycardia, borderline LVH with repolarization, inferior Q waves, transient inferior ST-T abnormalities, & supraventricular as well as ventricular ectopy. He was hypoxic in the ER with sats of 91% and was felt to have pneumonia. He was admitted. The following day he was seen by CCM for acute respiratory failure with hypercarbia and hypoxemia and was given a trial of bipap which he did not tolerate and was intubated and transferred to the MICU. A couple hours later he extubated himself and has remained stable. It was felt that he most likely had flash pulmonary edema. An echo yesterday showed a heavily calcified aortic valve with a mean gradient of 23 mm Hg and a peak of 60 mm Hg. His DI was 0.18 and the AVA was 0.9 cm2. There was moderate LV dysfunction with an EF of 35-40% with inferolateral and inferoseptal akinesis. He was loaded with Plavix. Cardiac cath showed 50% ostial LM stenosis and 3-vessel CAD with moderate pulmonary hypertension with PAP 64/34 and a wedge pressure of 33. CI was 2.56. PFT's showed severe obstructive defect and a severe reduction in diffusion capacity. After discussion and evaluation by Dr. Vassie Loll of pulmonary medicine it was felt that he was at high risk for pulmonary complications with surgery but not prohibitive risk. Since there was really no other treatment alternative except palliative care we decided to proceed. I discussed the operative procedure with the patient and his sister including alternatives, benefits and risks; including but not limited to bleeding, blood transfusion, infection, stroke, myocardial infarction, graft failure, heart block requiring a permanent pacemaker,  organ dysfunction, and death.  Craig [name] understands and agrees to proceed.    Preparation:  The patient was seen in the preoperative holding area and the correct patient, correct operation were  confirmed with the patient after reviewing the medical record and catheterization. The consent was signed by me. Preoperative antibiotics were given. A pulmonary arterial line and radial arterial line were placed by the anesthesia team. The patient was taken back to the operating room and positioned supine on the operating room table. After being placed under general endotracheal anesthesia by the anesthesia team a foley catheter was placed. The neck, chest, abdomen, and both legs were prepped with betadine soap and solution and draped in the usual sterile manner. A surgical time-out was taken and the correct patient and operative procedure were confirmed with the nursing and anesthesia staff.  TEE: performed by Dr. Judie Petit and will be dictated separately by her. I did review the images with her and it showed a bicuspid aortic valve with severe AS due to heavily calcified leaflets that were barely mobile. There was trivial MR, LVEF was 40%.   Left internal mammary harvest:  The left side of the sternum was retracted using the Rultract retractor. The left internal mammary artery was harvested as a pedicle graft. All side branches were clipped. It was a medium-sized vessel of good quality with excellent blood flow. It was ligated distally and divided. It was sprayed with topical papaverine solution to prevent vasospasm.   Endoscopic vein harvest:  The right greater saphenous vein was harvested endoscopically through a 2 cm incision medial to the right knee. It was harvested from the upper thigh to below the knee. It was a medium-sized vein of good quality in the thigh but the lower end was very small. Therefore a second segment of saphenous vein was harvested from the left thigh in a similar manner and it was a medium-sized vein of good quality. The side branches were all ligated with 4-0 silk ties.   Right axillary artery exposure and cannulation:  A transverse incision was made below the  right clavicle. The pectoralis major muscle was split along its fibers and the pectoralis minor muscle was retracted laterally. The brachial plexus was identified and gently retracted laterally to expose the axillary artery. The artery was controlled proximally and distally with vessel loops. The patient was fully heparinized and ACT maintained greater than 400. The axillary artery was clamped proximally and distally with peripheral Debakey clamps. It was opened longitudinally. There was no sign of dissection here. An 8 mm Hemashield dacron graft was anastomosed in an end to side manner using continuous 5-0 prolene suture. CoSeal was applied for hemostasis and the clamp removed. The graft was connected to the arterial end of the bypass circuit.   Cardiopulmonary Bypass:  A median sternotomy was performed. The pericardium was opened in the midline. Right ventricular function appeared normal. The ascending aorta was of aneurysmal and had calcified plaque present at the takeoff of the innominate and left carotid arteries as well as throughout the arch. The patient was fully systemically heparinized and the ACT was maintained > 400 sec.  Venous cannulation was performed via the right atrial appendage using a two-staged venous cannula.  Systemic cooling to 18 degrees Centigrade and topical cooling of the heart with iced saline were used. A temperature probe was inserted into the interventricular septum and an insulating pad was placed in the pericardium. CO2 was insufflated into the pericardium throughout the case  to minimize intracardiac air.   Resection and grafting of ascending aortic aneurysm:  The patient was placed on cardiopulmonary bypass and a left ventricular vent was placed via the right superior pulmonary vein. Systemic cooling was begun with a goal temperature of 18 degrees centigrade by bladder and rectal temperature probes. A retrograde cardioplegia cannula was placed through the right atrium  into the coronary sinus without difficulty. A retrograde cerebraplegia cannula was placed into the SVC through a pursestring suture and the SVC was encircled with a silastic tape. After 30 minutes of cooling the target temperature of 18 degrees centigrade was reached. Cerebral oximetry was 70% bilaterally. BIS was zero. The patient was given 150 mg of Etomidate and 125 mg of Solumedrol. The head was packed in ice. The bed was placed in steep trendelenburg. Circulatory arrest was begun and the blood volume emptied into the venous reservoir. Continuous antegrade cerebral perfusion was begun and the proximal innominate artery clamped. Cold blood retrograde cardioplegia was given and myocardial temperature dropped to 10 degrees centigrade. Additional doses were given at approximately 20 minute intervals throughout the period of circulatory arrest and cross-clamping. Complete diastolic arrest was maintained. Examination of the aortic arch showed that the arch was aneurysmal but not as large as the distal ascending aorta. The arch had significant calcified plaque extending out beyond the takeoff of the left subclavian artery and up into the origins of the innominate, left carotid and left subclavian arteries. I was concerned about the ability to have a hemostatic anastomosis distal to the left subclavian artery due to the calcified plaque and decided to do a hemiarch replacement. The aorta was transected just proximal to the innominate artery beveling the resection out along the undersurface of the aortic arch (Hemiarch replacement). The aortic diameter was measured at 40 mm here. A 30 x 10 mm Hemasheild Platinum vascular graft was prepared. ( Catalog # [id number] P0, Lot # [id number], SN [id number]). It was anastomosed to the aortic arch in an end to end manner using 3-0 prolene continuous suture with a felt strip to reinforce the anastomisis. A light coating of CoSeal was applied to seal needle holes. The clamp was removed  from the innominate artery and the circulation was slowly resumed.  The aortic graft was cross-clamped proximal to the anastomosis and full CPB support was resumed. Circulatory arrest time was 26 minutes. Retrograde cerebraplegia time was 25 minutes.  Coronary arteries:  The coronary arteries were examined.   LAD:  Moderate sized vessel with proximal and mid-vessel disease but no distal disease.  LCX:  Single OM that is moderate sized with no distal disease.  RCA:  Large vessel, diffuse disease. The PDA and PL are moderate sized vessels with no distal disease.   Grafts:  1. LIMA to the LAD: 1.75 mm. It was sewn end to side using 8-0 prolene continuous suture. 2. SVG to OM:  1.6 mm. It was sewn end to side using 7-0 prolene continuous suture. 3. Sequential SVG to PDA:  1.6 mm. It was sewn sequential side to side using 7-0 prolene continuous suture. 4. Sequential SVG to PL:  1.6 mm. It was sewn sequential end to side using 7-0 prolene continuous suture.  The proximal vein graft anastomoses were performed to the aortic graft using continuous 6-0 prolene suture. Graft markers were placed around the proximal anastomoses. The LIMA was very tight due to the large hyperinflated emphysematous lungs so I decided to use it as a free graft and the proximal end was  anastomosed to the proximal portion of the OM vein graft. The stump was suture ligated.   Supra-coronary aortic replacement and aortic valve replacement.   The ascending aorta was mobilized from the right pulmonary artery and main PA. It was transected at the sinotubular junction and the valve inspected. It was a bicuspid valve with fusion of the right and non-coronary cusps and heavy calcification of the leaflets and the annulus. There was heavy calcification around the coronary ostia. The aortic root did not appear aneurysmal and the sinuses were fairly normal sized. There was significant atherosclerotic disease of the root and ascending  aorta. I felt that a Wheat procedure was probably a quicker and less morbid operation for this patient since the root did not appear aneurysmal.  The native valve was excised taking care to remove all particulate debri. The annulus was decalcified with rongeurs. The annulus was sized and a 25 mm Edwards Magna-Ease pericardial valve was chosen [employer] social research officer # [id number], Serial # [id number]) A series of pledgetted 2-0 Ethibond horizontal mattress sutures were placed around the annulus with the pledgets in a sub-annular position. The sutures were placed through the valve sewing ring. The valve was lowered into place and the sutures tied. The valve seated nicely.  The  graft was then cut to the appropriate length and anastomosed end to end to the supra-coronary aorta using continuous 3-0 prolene suture. CoSeal was applied to seal the needle holes in the grafts. A vent cannula was placed into the graft to remove any air. Deairing maneuvers were performed and the bed placed in trendelenburg position.  Left femoral arterial line:  The right radial arterial line was giving [place] very high pressure readings throughout the period of bypass probably because of the right axillary arterial bypass. Therefore a left common femoral arterial line was inserted using Seldinger technique without difficulty.   Completion:  The patient was rewarmed to 37 degrees Centigrade. The clamp was removed from the LIMA pedicle and there was rapid warming of the septum and return of ventricular fibrillation. The crossclamp was removed with a time of 179 minutes. There was no spontaneous rhythm. The distal and proximal anastomoses were checked for hemostasis. The position of the grafts was satisfactory. The vascular anastomoses all appeared hemostatic. Two temporary epicardial pacing wires were placed on the right atrium and two on the right ventricle. He was paced in DOO mode. The patient was weaned from CPB after a period of reperfusion on Milrinone  0.5 and dopamine 5. CPB time was 309 minutes. Cardiac output was 6 LPM. TEE showed a normally functioning aortic valve prosthesis and no AI. LV function was improved. There was no significant MR. RV function looked good. Heparin was fully reversed with protamine and the venous cannula removed. The axillary artery graft was ligated with a heavy silk tie and divided and the stump suture ligated with 3-0 prolene suture. Hemostasis was achieved but the patient did have coagulopathy with low platelets and fibrinogen. He was give 2 units cryo 2 units FFP, and 2 units of platelets. Mediastinal and left pleural drainage tubes were placed. The sternum was closed with #6 stainless steel wires. The fascia was closed with continuous # 1 vicryl suture. The subcutaneous tissue was closed with 2-0 vicryl continuous suture. The skin was closed with 3-0 vicryl subcuticular suture. All sponge, needle, and instrument counts were reported correct at the end of the case. Dry sterile dressings were placed over the incisions and around the chest tubes which were  connected to pleurevac suction. The patient was then transported to the surgical intensive care unit in critical but stable condition.

## 2016-01-07 NOTE — Progress Notes (Signed)
CT surgery p.m. Rounds  Status post multivessel CABG, biologic-Bentall procedure and replacement of ascending aortic aneurysm with hemi-arch reconstruction  Patient is still narcotize but able to move all extremities Hemodynamics stable Breath sounds clear Chest tube drainage less than 100 cc/h Receiving second unit of FFP for elevated postop INR Patient will remain intubated until a.m. Rounds.  Craig [name] M.D. T CTS

## 2016-01-07 NOTE — Anesthesia Preprocedure Evaluation (Signed)
Anesthesia Evaluation  Patient identified by MRN, date of birth, ID band Patient awake    Reviewed: Allergy & Precautions, NPO status , Patient's Chart, lab work & pertinent test results  Airway Mallampati: II  TM Distance: >3 FB Neck ROM: Full    Dental   Pulmonary pneumonia, COPD, former smoker,           Cardiovascular hypertension, + angina + CAD and + Peripheral Vascular Disease       Neuro/Psych    GI/Hepatic negative GI ROS, Neg liver ROS,   Endo/Other    Renal/GU negative Renal ROS     Musculoskeletal   Abdominal   Peds  Hematology   Anesthesia Other Findings   Reproductive/Obstetrics                             Anesthesia Physical Anesthesia Plan  ASA: IV  Anesthesia Plan: General   Post-op Pain Management:    Induction: Intravenous  Airway Management Planned: Oral ETT  Additional Equipment: Arterial line, PA Cath and Ultrasound Guidance Line Placement  Intra-op Plan:   Post-operative Plan: Post-operative intubation/ventilation  Informed Consent: I have reviewed the patients History and Physical, chart, labs and discussed the procedure including the risks, benefits and alternatives for the proposed anesthesia with the patient or authorized representative who has indicated his/her understanding and acceptance.   Dental advisory given  Plan Discussed with: CRNA and Anesthesiologist  Anesthesia Plan Comments:         Anesthesia Quick Evaluation

## 2016-01-07 NOTE — Brief Op Note (Addendum)
12/26/2015 - 01/07/2016  2:07 PM  PATIENT:  Craig [name]  65 y.o. male  PRE-OPERATIVE DIAGNOSIS:  1. S/p NSTEMI 2. Multivessel CAD 3. Ascending thoracic aneurysm 4. Severe AS  POST-OPERATIVE DIAGNOSIS:  1. S/p NSTEMI 2. Multivessel CAD 3. Ascending thoracic aneurysm 4. Severe AS   PROCEDURE:  TRANSESOPHAGEAL ECHOCARDIOGRAM (TEE), CORONARY ARTERY BYPASS GRAFTING (CABG) x FOUR (LIMA to LAD, SVG to OM, SVG SEQUENTIALLY to PDA and PLB), USING LEFT INTERNAL MAMMARY ARTERY,  GREATER SAPHENOUS VEINS HARVESTED ENDOSCOPICALLY from RIGHT THIGH and LOWER LEG and LEFT THIGH, CIRC ARREST and REPLACEMENT of ASCENDING THORACIC ANEURYSM (straight Hema shield graft, 30x10 mm)  SURGEON:  Surgeon(s) and Role:    * Alleen Borne, MD - Primary  PHYSICIAN ASSISTANT: Doree Fudge PA-C   ANESTHESIA:   general  EBL:  Total I/O In: 1900 [I.V.:1900] Out: 100 [Urine:100]  BLOOD ADMINISTERED:Two CC PRBC  DRAINS: Chest tubes placed in the mediastinal and pleural spaces   SPECIMEN:  Source of Specimen:  Portion of ascending thoracic aneurysm and native aortic valve leaflets  DISPOSITION OF SPECIMEN:  PATHOLOGY  COUNTS CORRECT:  YES  DICTATION: .Dragon Dictation  PLAN OF CARE: Admit to inpatient   PATIENT DISPOSITION:  ICU - intubated and hemodynamically stable.   Delay start of Pharmacological VTE agent (>24hrs) due to surgical blood loss or risk of bleeding: yes  BASELINE WEIGHT: 63.2 kg  Aortic Valve Etiology   Aortic Insufficiency:  Trivial/Trace  Aortic Valve Disease:  Yes.  Aortic Stenosis:  Yes. Smallest Aortic Valve Area: 0.87cm2; Highest Mean Gradient: .  Etiology (Choose at least one and up to  5 etiologies):  Bicuspid valve disease and Degenerative - Calcified  Aortic Valve  Procedure Performed:  Replacement: Yes.  Bioprosthetic Valve. Implant Model Number:[id number], Size:25, Unique Device Identifier:[id number]  Repair/Reconstruction: No.   Aortic Annular  Enlargement: No.

## 2016-01-07 NOTE — Anesthesia Procedure Notes (Addendum)
Central Venous Catheter Insertion Performed by: anesthesiologist 01/07/2016 6:58 AM Patient location: Pre-op. Preanesthetic checklist: patient identified, IV checked, site marked, risks and benefits discussed, surgical consent, monitors and equipment checked, pre-op evaluation, timeout performed and anesthesia consent Position: Trendelenburg Lidocaine 1% used for infiltration Landmarks identified and Seldinger technique used Catheter size: 8.5 Fr PA cath was placed.Sheath introducer Swan type and PA catheter depth:thermodilation and 50PA Cath depth:50 Procedure performed using ultrasound guided technique. Attempts: 1 Following insertion, line sutured and dressing applied. Post procedure assessment: free fluid flow, blood return through all ports and no air. Patient tolerated the procedure well with no immediate complications.   Procedure Name: Intubation Date/Time: 01/07/2016 7:41 AM Performed by: Coralee Rud Pre-anesthesia Checklist: Patient identified, Emergency Drugs available, Suction available, Patient being monitored and Timeout performed Patient Re-evaluated:Patient Re-evaluated prior to inductionOxygen Delivery Method: Circle system utilized Preoxygenation: Pre-oxygenation with 100% oxygen Intubation Type: IV induction Ventilation: Two handed mask ventilation required Laryngoscope Size: Miller and 3 Grade View: Grade II Tube type: Subglottic suction tube Tube size: 8.0 mm Number of attempts: 1 Airway Equipment and Method: Stylet Placement Confirmation: ETT inserted through vocal cords under direct vision,  positive ETCO2 and breath sounds checked- equal and bilateral Secured at: 22 cm Tube secured with: Tape

## 2016-01-07 NOTE — Transfer of Care (Signed)
Immediate Anesthesia Transfer of Care Note  Patient: Craig [name]  Procedure(s) Performed: Procedure(s) with comments: CORONARY ARTERY BYPASS GRAFTING (CABG), ON PUMP, TIMES FOUR, USING LEFT INTERNAL MAMMARY ARTERY, BILATERAL GREATER SAPHENOUS VEINS HARVESTED ENDOSCOPICALLY (N/A) - LIMA to LAD, SVG to OM, SVG SEQUENTIALLY to PDA and PLB TRANSESOPHAGEAL ECHOCARDIOGRAM (TEE) (N/A) CIRC ARREST AND REPLACEMENT OF ASCENDING THORACIC  ANEURYSM (N/A)  Patient Location: SICU  Anesthesia Type:General  Level of Consciousness: Patient remains intubated per anesthesia plan  Airway & Oxygen Therapy: Patient remains intubated per anesthesia plan and Patient placed on Ventilator (see vital sign flow sheet for setting)  Post-op Assessment: Report given to RN and Post -op Vital signs reviewed and stable  Post vital signs: Reviewed and stable  Last Vitals:  Filed Vitals:   01/06/16 2040 01/07/16 0519  BP: 119/82 120/79  Pulse: 71 70  Temp: 36.4 C 36.6 C  Resp: 20 20    Complications: No apparent anesthesia complications

## 2016-01-07 NOTE — Progress Notes (Signed)
Patient in ICU on Dr. Sharee Pimple Service.  Appreciate assistance.  Will be available as needed.  Crista Curb, MD [facility] [email or web address] password [facility]

## 2016-01-07 NOTE — OR Nursing (Signed)
Forty-five minute call to SICU charge nurse at 1602.

## 2016-01-08 ENCOUNTER — Encounter (HOSPITAL_COMMUNITY): Payer: Self-pay | Admitting: Surgery

## 2016-01-08 ENCOUNTER — Inpatient Hospital Stay (HOSPITAL_COMMUNITY): Payer: Medicaid Other

## 2016-01-08 DIAGNOSIS — J432 Centrilobular emphysema: Secondary | ICD-10-CM

## 2016-01-08 LAB — BASIC METABOLIC PANEL
ANION GAP: 7 (ref 5–15)
BUN: 13 mg/dL (ref 6–20)
CHLORIDE: 104 mmol/L (ref 101–111)
CO2: 27 mmol/L (ref 22–32)
Calcium: 7.8 mg/dL — ABNORMAL LOW (ref 8.9–10.3)
Creatinine, Ser: 0.91 mg/dL (ref 0.61–1.24)
GFR calc Af Amer: >60 mL/min (ref >60)
GFR calc non Af Amer: >60 mL/min (ref >60)
GLUCOSE: 84 mg/dL (ref 65–99)
POTASSIUM: 4.6 mmol/L (ref 3.5–5.1)
Sodium: 138 mmol/L (ref 135–145)

## 2016-01-08 LAB — CBC
HEMATOCRIT: 21.8 % — AB (ref 39.0–52.0)
HEMATOCRIT: 27.9 % — AB (ref 39.0–52.0)
HEMOGLOBIN: 9.5 g/dL — AB (ref 13.0–17.0)
Hemoglobin: 7.4 g/dL — ABNORMAL LOW (ref 13.0–17.0)
MCH: 30.5 pg (ref 26.0–34.0)
MCH: 29.8 pg (ref 26.0–34.0)
MCHC: 33.9 g/dL (ref 30.0–36.0)
MCHC: 34.1 g/dL (ref 30.0–36.0)
MCV: 89.7 fL (ref 78.0–100.0)
MCV: 87.5 fL (ref 78.0–100.0)
Platelets: 131 10*3/uL — ABNORMAL LOW (ref 150–400)
Platelets: 92 10*3/uL — ABNORMAL LOW (ref 150–400)
RBC: 2.43 MIL/uL — AB (ref 4.22–5.81)
RBC: 3.19 MIL/uL — ABNORMAL LOW (ref 4.22–5.81)
RDW: 17.3 % — ABNORMAL HIGH (ref 11.5–15.5)
RDW: 16.7 % — ABNORMAL HIGH (ref 11.5–15.5)
WBC: 17 10*3/uL — AB (ref 4.0–10.5)
WBC: 20.8 10*3/uL — ABNORMAL HIGH (ref 4.0–10.5)

## 2016-01-08 LAB — POCT I-STAT 3, ART BLOOD GAS (G3+)
ACID-BASE EXCESS: 7 mmol/L — AB (ref 0.0–2.0)
Acid-Base Excess: 1 mmol/L (ref 0.0–2.0)
Acid-Base Excess: 1 mmol/L (ref 0.0–2.0)
BICARBONATE: 25.8 meq/L — AB (ref 20.0–24.0)
Bicarbonate: 31 mEq/L — ABNORMAL HIGH (ref 20.0–24.0)
Bicarbonate: 26 mEq/L — ABNORMAL HIGH (ref 20.0–24.0)
O2 Saturation: 99 %
O2 Saturation: 95 %
O2 Saturation: 93 %
PCO2 ART: 39.5 mmHg (ref 35.0–45.0)
PCO2 ART: 41.3 mmHg (ref 35.0–45.0)
PCO2 ART: 40.1 mmHg (ref 35.0–45.0)
PH ART: 7.502 — AB (ref 7.350–7.450)
PH ART: 7.403 (ref 7.350–7.450)
PH ART: 7.417 (ref 7.350–7.450)
PO2 ART: 129 mmHg — AB (ref 80.0–100.0)
PO2 ART: 72 mmHg — AB (ref 80.0–100.0)
Patient temperature: 36.7
Patient temperature: 36.5
TCO2: 32 mmol/L (ref 0–100)
TCO2: 27 mmol/L (ref 0–100)
TCO2: 27 mmol/L (ref 0–100)
pO2, Arterial: 66 mmHg — ABNORMAL LOW (ref 80.0–100.0)

## 2016-01-08 LAB — POCT I-STAT, CHEM 8
BUN: 19 mg/dL (ref 6–20)
CREATININE: 0.9 mg/dL (ref 0.61–1.24)
Calcium, Ion: 1.11 mmol/L — ABNORMAL LOW (ref 1.13–1.30)
Chloride: 97 mmol/L — ABNORMAL LOW (ref 101–111)
Glucose, Bld: 177 mg/dL — ABNORMAL HIGH (ref 65–99)
HEMATOCRIT: 29 % — AB (ref 39.0–52.0)
HEMOGLOBIN: 9.9 g/dL — AB (ref 13.0–17.0)
Potassium: 4.4 mmol/L (ref 3.5–5.1)
Sodium: 137 mmol/L (ref 135–145)
TCO2: 26 mmol/L (ref 0–100)

## 2016-01-08 LAB — PREPARE CRYOPRECIPITATE
UNIT DIVISION: 0
Unit division: 0

## 2016-01-08 LAB — PREPARE FRESH FROZEN PLASMA
UNIT DIVISION: 0
UNIT DIVISION: 0
Unit division: 0
Unit division: 0

## 2016-01-08 LAB — PREPARE PLATELET PHERESIS
UNIT DIVISION: 0
Unit division: 0

## 2016-01-08 LAB — GLUCOSE, CAPILLARY
GLUCOSE-CAPILLARY: 128 mg/dL — AB (ref 65–99)
GLUCOSE-CAPILLARY: 123 mg/dL — AB (ref 65–99)
Glucose-Capillary: 116 mg/dL — ABNORMAL HIGH (ref 65–99)
Glucose-Capillary: 117 mg/dL — ABNORMAL HIGH (ref 65–99)
Glucose-Capillary: 125 mg/dL — ABNORMAL HIGH (ref 65–99)
Glucose-Capillary: 163 mg/dL — ABNORMAL HIGH (ref 65–99)
Glucose-Capillary: 167 mg/dL — ABNORMAL HIGH (ref 65–99)
Glucose-Capillary: 171 mg/dL — ABNORMAL HIGH (ref 65–99)
Glucose-Capillary: 88 mg/dL (ref 65–99)
Glucose-Capillary: 92 mg/dL (ref 65–99)

## 2016-01-08 LAB — MAGNESIUM
Magnesium: 2.7 mg/dL — ABNORMAL HIGH (ref 1.7–2.4)
Magnesium: 2.5 mg/dL — ABNORMAL HIGH (ref 1.7–2.4)

## 2016-01-08 LAB — CREATININE, SERUM: CREATININE: 1.14 mg/dL (ref 0.61–1.24)

## 2016-01-08 LAB — PREPARE RBC (CROSSMATCH)

## 2016-01-08 MED ORDER — SODIUM CHLORIDE 0.9 % IV SOLN
Freq: Once | INTRAVENOUS | Status: AC
  Administered 2016-01-08: 07:00:00 via INTRAVENOUS

## 2016-01-08 MED ORDER — FUROSEMIDE 10 MG/ML IJ SOLN
40.0000 mg | Freq: Two times a day (BID) | INTRAMUSCULAR | Status: DC
  Administered 2016-01-08 (×2): 40 mg via INTRAVENOUS
  Filled 2016-01-08 (×2): qty 4

## 2016-01-08 MED ORDER — CETYLPYRIDINIUM CHLORIDE 0.05 % MT LIQD
7.0000 mL | Freq: Two times a day (BID) | OROMUCOSAL | Status: DC
  Administered 2016-01-08 – 2016-01-11 (×6): 7 mL via OROMUCOSAL

## 2016-01-08 NOTE — Progress Notes (Signed)
PCCM PROGRESS NOTE  ADMISSION DATE: 12/26/2015 CONSULT DATE: 12/27/2015 REFERRING PROVIDER: [facility]  CC: Short of breath  SUBJECTIVE: Mild sore throat.  Chest pain tolerable.  VITAL SIGNS: BP 111/65 mmHg  Pulse 80  Temp(Src) 97.2 F (36.2 C) (Core (Comment))  Resp 21  Ht 5\' 9"  (1.753 m)  Wt 159 lb 9.8 oz (72.4 kg)  BMI 23.56 kg/m2  SpO2 94%  INTAKE/OUTPUT: I/O last 3 completed shifts: In: 9510.6 [I.V.:6410.6; Blood:2120; IV Piggyback:980] Out: 5225 [Urine:3175; Blood:1200; Chest Tube:850]  General: alert HEENT: no stridor Cardiac: regular, 2/6 SM, wound dressing clean Chest: no wheeze Abd: soft, non tender Ext: 1+ edema Neuro: normal strength Skin: no rashes   CBC Recent Labs     01/07/16  0400   01/07/16  1409   01/07/16  1814  01/07/16  1815  01/08/16  0300  WBC  11.1*   --    --    --    --   22.6*  17.0*  HGB  11.8*   < >  8.8*   < >  10.5*  9.3*  7.4*  HCT  36.5*   < >  26.0*   < >  31.0*  28.1*  21.8*  PLT  245   --   60*   --    --   173  131*   < > = values in this interval not displayed.    Coag's Recent Labs     01/07/16  1815  APTT  37  INR  1.54*    BMET Recent Labs     01/06/16  0945  01/07/16  0400   01/07/16  1541  01/07/16  1615  01/07/16  1814  01/08/16  0300  NA  139  140   < >  136  139  140  138  K  4.0  5.1   < >  5.2*  4.4  4.7  4.6  CL  99*  103   < >  97*  97*   --   104  CO2  28  30   --    --    --    --   27  BUN  15  16   < >  15  15   --   13  CREATININE  1.15  1.14   < >  0.60*  0.60*   --   0.91  GLUCOSE  120*  100*   < >  142*  141*  150*  84   < > = values in this interval not displayed.    Electrolytes Recent Labs     01/06/16  0945  01/07/16  0400  01/08/16  0300  CALCIUM  9.2  9.1  7.8*  MG  1.9  1.9  2.7*    ABG Recent Labs     01/07/16  2040  01/08/16  0440  01/08/16  0831  PHART  7.407  7.502*  7.403  PCO2ART  42.8  39.5  41.3  PO2ART  92.0  129.0*  72.0*    Glucose Recent Labs   01/07/16  2213  01/07/16  2318  01/08/16  0033  01/08/16  0145  01/08/16  0439  01/08/16  0731  GLUCAP  123*  117*  116*  92  88  125*    Imaging Dg Chest Port 1 View  01/08/2016  CLINICAL DATA:  CABG. EXAM: PORTABLE CHEST 1 VIEW COMPARISON:  01/07/2016. FINDINGS: Endotracheal  tube, NG tube, Swan-Ganz catheter, left chest tube in stable position. Prior CABG and cardiac valve replacement. Cardiomegaly. No pulmonary venous congestion. Low lung volumes with bibasilar atelectasis and/or mild infiltrates. No prominent pleural effusion. No pneumothorax . IMPRESSION: 1. Lines and tubes in stable position. 2. Prior CABG and cardiac valve replacement. Stable cardiomegaly. No pulmonary venous congestion. 3. Low lung volumes with mild bibasilar atelectasis and/or infiltrates. Electronically Signed   By: Maisie Fus  Register   On: 01/08/2016 07:36   Dg Chest Port 1 View  01/07/2016  CLINICAL DATA:  Status post CABG and aortic valve replacement. EXAM: PORTABLE CHEST 1 VIEW COMPARISON:  12/28/2015 and prior chest radiographs FINDINGS: CABG and aortic valve replacement changes identified. The cardiomediastinal silhouette is unchanged. An endotracheal tube with tip 5 cm above the carina, right IJ Swan-Ganz catheter with tip overlying the main pulmonary artery, mediastinal and left thoracostomy tubes now noted. There is no evidence of pneumothorax or pulmonary edema. No large pleural effusions are identified. Remote rib fractures again noted. IMPRESSION: Postoperative changes with support apparatus as described. No evidence of pneumothorax or pulmonary edema. Electronically Signed   By: Harmon Pier M.D.   On: 01/07/2016 18:18    CULTURES: 2/19 Respiratory viral panel >> negative 2/19 Sputum >> oral flora 2/19 Pneumococcal ag >> negative  ANTIBIOTICS: 2/18 Zithromax >> 2/21 2/18 Rocephin >> 2/24 3/02 Zinacef  STUDIES: 2/18 CT chest >> emphysema, consolidation RLL 2/20 Echo >> EF 35 to 40%, severe AS, 42 mm  aortic root, mild MR 2/27 PFT >> FEV1 1.26 (37%), FEV1% 34, TLC 6.17 (90%), DLCO 26%, no BD  EVENTS: 2/18 Admit, cardiology consulted 2/19 To ICU, VDRF 2/21 Rt/Lt heart cath, TCTS consulted 2/26 PAF 3/02 CABG, AVR, replacement of ascending thoracic aortic aneurysm 3/03 Transfuse PRBC  LINES/TUBES: 2/19 ETT >> 2/19 (self extubated) 3/02 ETT >> 3/03 3/02 Rt radial a line >> 3/02 Rt IJ introducer >>  DISCUSSION: 65 yo male former smoker presented with fever, cough, dyspnea, chills from pneumonia.  He was found to have troponin elevation.  He has hx of COPD/emphysema.  ASSESSMENT/PLAN:  PULMONARY A: Acute on chronic hypoxic/hypercapnic respiratory failure. COPD/emphysema with exacerbation. P: Continue spiriva, dulera Prn albuterol Bronchial hygiene Oxygen to keep SpO2 90 to 95%  CARDIOLOGY A: NSTEMI. Severe AS and severe CAD s/p CABG and AVR 3/02. Acute on chronic systolic/diastolic CHF with acute pulmonary edema. PAF >> sinus rhythm now. HLD. P: Per TCTS  RENAL A: No acute issues. P: Monitor renal fx, urine outpt  GASTROENTEROLOGY A: Nutrition. P: Advance diet per TCTS Protonix for SUP  HEMATOLOGY A: Anemia of critical illness. P: F/u CBC Transfuse per TCTS  INFECTION A: CAP >> completed Abx 2/24. Post-op prophylaxis. P: Zinacef per TCTS  ENDOCRINE A: No acute issues. P: Monitor blood sugars  NEUROLOGY A: Post-op pain control. P: Per TCTS  Updated pt's family at bedside.  PCCM will f/u 01/11/16 >> call if help needed sooner.   Coralyn Helling, MD [facility] Pulmonary/Critical Care 01/08/2016, 11:02 AM Pager:  [phone] After 3pm call: [phone]

## 2016-01-08 NOTE — Progress Notes (Signed)
Patient placed on CPAP 10/5 per protocol with no complications. Nurse is aware. Will continue to monitor pt.

## 2016-01-08 NOTE — Progress Notes (Signed)
ICU UR Completed. Dinara Lupu, RN, BSN.  [phone]  

## 2016-01-08 NOTE — Procedures (Signed)
Extubation Procedure Note  Patient Details:   Name: Craig [name] DOB: 03/23/1951 MRN: [id number]   Airway Documentation:   Patient extubated after having an audible cuff leak and due to protocol and per MD order with no complications. BS are equal with no stridor present.   Evaluation  O2 sats: stable throughout Complications: No apparent complications Patient did tolerate procedure well. Bilateral Breath Sounds: Clear, Diminished Suctioning: Airway Yes  Libby MawBrittney M Zhamir Pirro 01/08/2016, 9:53 AM

## 2016-01-08 NOTE — Progress Notes (Signed)
07:00 Pt received from RN Chrissie NoaWilliam s/p multivessel CABG (biological-bentall procedure & replacement ascending aortic aneurysm & hemi-arch resection). POD#1. Alert and responsive, follows commands. Mechanically ventillated SIMV TV 600, RR 16, FiO2 0.5, PEEP 5. ETT 8.0, 23 cm lip, B/S clear bilateral. Maintained on CM SR Atrial paced. BP cuff Sys/Dia approx 15 mmHg < Arterial. CT in place x 3 (1 pt, 2 mt) draining. Foley cath in place draining. Bilateral wrist restraints in place, midsternal and bilateral leg dressing in place, gauze clean, dry, intact. CI> 2.0 plan to extubate and wean off Milrinone and Dopamine IV. 08:49 Pt extubated, tolerated well. Placed on O2 [place] 4L. No acute distress. Vitals stable. 10:22 Tolerated dangling at bedside, Theone MurdochSwan Ganz cath removed. No acute distress. Vitals stable.

## 2016-01-08 NOTE — Progress Notes (Signed)
Patient ID: Craig FeinsteinRobert S [name], male   DOB: 02/03/1951, 65 y.o.   MRN: [id number]  SICU Evening Rounds:   Hemodynamically stable , DDD paced 80.  Diuresing well  CT output low  CBC    Component Value Date/Time   WBC 20.8* 01/08/2016 1635   RBC 3.19* 01/08/2016 1635   HGB 9.5* 01/08/2016 1635   HCT 27.9* 01/08/2016 1635   PLT 92* 01/08/2016 1635   MCV 87.5 01/08/2016 1635   MCH 29.8 01/08/2016 1635   MCHC 34.1 01/08/2016 1635   RDW 16.7* 01/08/2016 1635   LYMPHSABS 2.6 05/27/2009 2134   MONOABS 0.7 05/27/2009 2134   EOSABS 0.1 05/27/2009 2134   BASOSABS 0.1 05/27/2009 2134     BMET    Component Value Date/Time   NA 137 01/08/2016 1632   K 4.4 01/08/2016 1632   CL 97* 01/08/2016 1632   CO2 27 01/08/2016 0300   GLUCOSE 177* 01/08/2016 1632   BUN 19 01/08/2016 1632   CREATININE 1.14 01/08/2016 1635   CALCIUM 7.8* 01/08/2016 0300   GFRNONAA >60 01/08/2016 1635   GFRAA >60 01/08/2016 1635     A/P:  Stable postop course. Continue current plans

## 2016-01-08 NOTE — Care Management Note (Signed)
Case Management Note Previous CM note initiated by Raynald BlendSamantha Temesha Queener RN, CM  Patient Details  Name: Levert FeinsteinRobert S Carradine MRN: [id number] Date of Birth: 02/17/1951  Subjective/Objective:        Pt admitted with Acute Respiratory Failure -  Pt is now extubated on [place]     Action/Plan:   01/08/2016 Pt is s/p CABG  01/06/16 Pt is independent from home with son [name].  Pt is without insurance; will need PCP initiated at [facility] prior to discharge. CM will continue to monitor for disposition needs   Expected Discharge Date:                  Expected Discharge Plan:  Home/Self Care  In-House Referral:     Discharge planning Services  CM Consult  Post Acute Care Choice:    Choice offered to:     DME Arranged:    DME Agency:     HH Arranged:    HH Agency:     Status of Service:  In process, will continue to follow  Medicare Important Message Given:    Date Medicare IM Given:    Medicare IM give by:    Date Additional Medicare IM Given:    Additional Medicare Important Message give by:     If discussed at Long Length of Stay Meetings, dates discussed:  01/05/16, 01/07/16  Additional Comments:  01/06/16- 1145- Donn PieriniKristi Webster RN, BSN- plan for CABG/AVR tomorrow 3/2- CM will follow post op for d/c needs  Cherylann ParrClaxton, Glorie Dowlen S, RN 01/08/2016, 1:52 PM

## 2016-01-08 NOTE — Progress Notes (Addendum)
1 Day Post-Op Procedure(s) (LRB): CORONARY ARTERY BYPASS GRAFTING (CABG), ON PUMP, TIMES FOUR, USING LEFT INTERNAL MAMMARY ARTERY, BILATERAL GREATER SAPHENOUS VEINS HARVESTED ENDOSCOPICALLY (N/A) TRANSESOPHAGEAL ECHOCARDIOGRAM (TEE) (N/A) CIRC ARREST AND REPLACEMENT OF ASCENDING THORACIC  ANEURYSM (N/A) Subjective:  awake and alert on vent, wants ETT out  Objective: Vital signs in last 24 hours: Temp:  [97.4 F (36.3 C)-98.2 F (36.8 C)] 97.7 F (36.5 C) (03/03 0800) Pulse Rate:  [73-105] 80 (03/03 0800) Cardiac Rhythm:  [-] Normal sinus rhythm (03/02 2000) Resp:  [0-22] 16 (03/03 0800) BP: (81-129)/(53-76) 92/62 mmHg (03/03 0700) SpO2:  [95 %-100 %] 98 % (03/03 0800) Arterial Line BP: (91-142)/(44-78) 110/52 mmHg (03/03 0800) FiO2 (%):  [40 %-50 %] 40 % (03/03 0800) Weight:  [72.4 kg (159 lb 9.8 oz)] 72.4 kg (159 lb 9.8 oz) (03/03 0445)  Hemodynamic parameters for last 24 hours: PAP: (31-43)/(15-31) 38/25 mmHg CO:  [3.1 L/min-4.6 L/min] 4.6 L/min CI:  [1.7 L/min/m2-2.6 L/min/m2] 2.6 L/min/m2  Intake/Output from previous day: 03/02 0701 - 03/03 0700 In: 9510.6 [I.V.:6410.6; Blood:2120; IV Piggyback:980] Out: 5225 [Urine:3175; Blood:1200; Chest Tube:850] Intake/Output this shift:    General appearance: alert and cooperative Neurologic: intact Heart: regular rate and rhythm, S1, S2 normal, no murmur, click, rub or gallop Lungs: clear to auscultation bilaterally Abdomen: soft, non-tender; bowel sounds normal; no masses,  no organomegaly Extremities: edema mild Wound: dressings dry  Lab Results:  Recent Labs  01/07/16 1815 01/08/16 0300  WBC 22.6* 17.0*  HGB 9.3* 7.4*  HCT 28.1* 21.8*  PLT 173 131*   BMET:  Recent Labs  01/07/16 0400  01/07/16 1615 01/07/16 1814 01/08/16 0300  NA 140  < > 139 140 138  K 5.1  < > 4.4 4.7 4.6  CL 103  < > 97*  --  104  CO2 30  --   --   --  27  GLUCOSE 100*  < > 141* 150* 84  BUN 16  < > 15  --  13  CREATININE 1.14  < >  0.60*  --  0.91  CALCIUM 9.1  --   --   --  7.8*  < > = values in this interval not displayed.  PT/INR:  Recent Labs  01/07/16 1815  LABPROT 18.5*  INR 1.54*   ABG    Component Value Date/Time   PHART 7.502* 01/08/2016 0440   HCO3 31.0* 01/08/2016 0440   TCO2 32 01/08/2016 0440   ACIDBASEDEF 1.0 12/29/2015 1415   O2SAT 99.0 01/08/2016 0440   CBG (last 3)   Recent Labs  01/08/16 0033 01/08/16 0145 01/08/16 0439  GLUCAP 116* 92 88   CXR: ok  ECG: NSR. Nonspecific changes  Assessment/Plan: S/P Procedure(s) (LRB): CORONARY ARTERY BYPASS GRAFTING (CABG), ON PUMP, TIMES FOUR, USING LEFT INTERNAL MAMMARY ARTERY, BILATERAL GREATER SAPHENOUS VEINS HARVESTED ENDOSCOPICALLY (N/A) TRANSESOPHAGEAL ECHOCARDIOGRAM (TEE) (N/A) CIRC ARREST AND REPLACEMENT OF ASCENDING THORACIC  ANEURYSM (N/A)  He is hemodynamically stable on milrinone and dopamine with good CI. CXR looks ok and gas exchange adequate. Will wean to extubate this am. He has severe COPD.  Acute postop blood loss anemia: transfusing 2 units this am  Volume excess: diurese  Keep chest tubes in for now.   LOS: 13 days    Alleen BorneBryan K Renna Kilmer 01/08/2016

## 2016-01-09 ENCOUNTER — Inpatient Hospital Stay (HOSPITAL_COMMUNITY): Payer: Medicaid Other

## 2016-01-09 LAB — BASIC METABOLIC PANEL
Anion gap: 8 (ref 5–15)
BUN: 23 mg/dL — ABNORMAL HIGH (ref 6–20)
CHLORIDE: 101 mmol/L (ref 101–111)
CO2: 27 mmol/L (ref 22–32)
Calcium: 8 mg/dL — ABNORMAL LOW (ref 8.9–10.3)
Creatinine, Ser: 1.15 mg/dL (ref 0.61–1.24)
GFR calc non Af Amer: >60 mL/min (ref >60)
Glucose, Bld: 181 mg/dL — ABNORMAL HIGH (ref 65–99)
POTASSIUM: 4.8 mmol/L (ref 3.5–5.1)
SODIUM: 136 mmol/L (ref 135–145)

## 2016-01-09 LAB — GLUCOSE, CAPILLARY
GLUCOSE-CAPILLARY: 152 mg/dL — AB (ref 65–99)
GLUCOSE-CAPILLARY: 141 mg/dL — AB (ref 65–99)
GLUCOSE-CAPILLARY: 122 mg/dL — AB (ref 65–99)

## 2016-01-09 LAB — CBC
HEMATOCRIT: 26.8 % — AB (ref 39.0–52.0)
HEMOGLOBIN: 9 g/dL — AB (ref 13.0–17.0)
MCH: 29.9 pg (ref 26.0–34.0)
MCHC: 33.6 g/dL (ref 30.0–36.0)
MCV: 89 fL (ref 78.0–100.0)
Platelets: 93 10*3/uL — ABNORMAL LOW (ref 150–400)
RBC: 3.01 MIL/uL — ABNORMAL LOW (ref 4.22–5.81)
RDW: 17.5 % — AB (ref 11.5–15.5)
WBC: 21.7 10*3/uL — ABNORMAL HIGH (ref 4.0–10.5)

## 2016-01-09 MED ORDER — WARFARIN - PHYSICIAN DOSING INPATIENT: Freq: Every day | Status: DC

## 2016-01-09 MED ORDER — INSULIN ASPART 100 UNIT/ML ~~LOC~~ SOLN
0.0000 [IU] | SUBCUTANEOUS | Status: DC
Start: 2016-01-09 — End: 2016-01-19
  Administered 2016-01-09 – 2016-01-18 (×21): 2 [IU] via SUBCUTANEOUS

## 2016-01-09 MED ORDER — WARFARIN SODIUM 2.5 MG PO TABS
2.5000 mg | ORAL_TABLET | Freq: Once | ORAL | Status: AC
  Administered 2016-01-09: 2.5 mg via ORAL
  Filled 2016-01-09: qty 1

## 2016-01-09 NOTE — Progress Notes (Signed)
2 Days Post-Op Procedure(s) (LRB): CORONARY ARTERY BYPASS GRAFTING (CABG), ON PUMP, TIMES FOUR, USING LEFT INTERNAL MAMMARY ARTERY, BILATERAL GREATER SAPHENOUS VEINS HARVESTED ENDOSCOPICALLY (N/A) TRANSESOPHAGEAL ECHOCARDIOGRAM (TEE) (N/A) CIRC ARREST AND REPLACEMENT OF ASCENDING THORACIC  ANEURYSM (N/A) Subjective:  Complain of pain right shoulder that he feels is due to right neck sleeve.  Objective: Vital signs in last 24 hours: Temp:  [97.3 F (36.3 C)-97.7 F (36.5 C)] 97.3 F (36.3 C) (03/04 0719) Pulse Rate:  [64-95] 80 (03/04 1000) Cardiac Rhythm:  [-] A-V Sequential paced (03/04 1000)  Atrial fib with vent rate 60 under pacer with pauses. Resp:  [9-23] 21 (03/04 1000) BP: (83-131)/(53-74) 113/73 mmHg (03/04 1000) SpO2:  [94 %-100 %] 96 % (03/04 1000) Arterial Line BP: (97-162)/(44-65) 119/51 mmHg (03/03 1700) Weight:  [72.3 kg (159 lb 6.3 oz)] 72.3 kg (159 lb 6.3 oz) (03/04 0500)  Hemodynamic parameters for last 24 hours:    Intake/Output from previous day: 03/03 0701 - 03/04 0700 In: 1114.8 [I.V.:294.8; Blood:670; IV Piggyback:150] Out: 2855 [Urine:2005; Chest Tube:850] Intake/Output this shift: Total I/O In: 12 [I.V.:12] Out: 120 [Urine:70; Chest Tube:50]  General appearance: alert and cooperative Neurologic: intact Heart: regular rate and rhythm, S1, S2 normal, no murmur, click, rub or gallop Lungs: clear to auscultation bilaterally Extremities: edema mild Wound: dressings dry  Lab Results:  Recent Labs  01/08/16 1635 01/09/16 0444  WBC 20.8* 21.7*  HGB 9.5* 9.0*  HCT 27.9* 26.8*  PLT 92* 93*   BMET:  Recent Labs  01/08/16 0300 01/08/16 1632 01/08/16 1635 01/09/16 0444  NA 138 137  --  136  K 4.6 4.4  --  4.8  CL 104 97*  --  101  CO2 27  --   --  27  GLUCOSE 84 177*  --  181*  BUN 13 19  --  23*  CREATININE 0.91 0.90 1.14 1.15  CALCIUM 7.8*  --   --  8.0*    PT/INR:  Recent Labs  01/07/16 1815  LABPROT 18.5*  INR 1.54*   ABG     Component Value Date/Time   PHART 7.417 01/08/2016 1214   HCO3 26.0* 01/08/2016 1214   TCO2 26 01/08/2016 1632   ACIDBASEDEF 1.0 12/29/2015 1415   O2SAT 93.0 01/08/2016 1214   CBG (last 3)   Recent Labs  01/08/16 1108 01/08/16 1605 01/08/16 2053  GLUCAP 163* 167* 171*   CXR: ok  Assessment/Plan: S/P Procedure(s) (LRB): CORONARY ARTERY BYPASS GRAFTING (CABG), ON PUMP, TIMES FOUR, USING LEFT INTERNAL MAMMARY ARTERY, BILATERAL GREATER SAPHENOUS VEINS HARVESTED ENDOSCOPICALLY (N/A) TRANSESOPHAGEAL ECHOCARDIOGRAM (TEE) (N/A) CIRC ARREST AND REPLACEMENT OF ASCENDING THORACIC  ANEURYSM (N/A)  He is hemodynamically stable on low dose Milrinone. Wean off  Postop atrial fibrillation with controlled rate. He had some atrial fib preop. His rate is slow with pauses so will not start beta blocker or amio. Will start coumadin with goal of rate control and therapeutic anticoagulation. Hopefully he will go back into sinus at some point or could have DCCV. Will V-pace at 80 for now to maximize cardiac output.  Type II DM.  Glucose has been up to 170-180. Will use SSI and probably will need oral agent if glucose remains elevated. He did have periop steroids for circ arrest and is on Milrinone.  DC chest tubes and sleeve.   Continue IS, ambulate.   LOS: 14 days    Alleen BorneBryan K Mayan Dolney 01/09/2016

## 2016-01-09 NOTE — Progress Notes (Signed)
Patient ID: Craig FeinsteinRobert S [name], male   DOB: 10/03/1951, 65 y.o.   MRN: [id number]  SICU Evening Rounds:  Hemodynamically stable Remains in atrial fib, VVI paced at 80  Diuresing   Ambulated and got some sleep.  Continue present plans.

## 2016-01-09 NOTE — Progress Notes (Signed)
Patient ambulated in hallway 150 ft, wheelchair, standby assist, patient in chair, call bell within reach, will continue to monitor.  Hermina BartersBOWMAN, Airis Barbee M, RN

## 2016-01-10 ENCOUNTER — Inpatient Hospital Stay (HOSPITAL_COMMUNITY): Payer: Medicaid Other

## 2016-01-10 LAB — CBC
HCT: 26.9 % — ABNORMAL LOW (ref 39.0–52.0)
Hemoglobin: 8.7 g/dL — ABNORMAL LOW (ref 13.0–17.0)
MCH: 29.6 pg (ref 26.0–34.0)
MCHC: 32.3 g/dL (ref 30.0–36.0)
MCV: 91.5 fL (ref 78.0–100.0)
Platelets: 83 10*3/uL — ABNORMAL LOW (ref 150–400)
RBC: 2.94 MIL/uL — ABNORMAL LOW (ref 4.22–5.81)
RDW: 17.1 % — ABNORMAL HIGH (ref 11.5–15.5)
WBC: 22.1 10*3/uL — ABNORMAL HIGH (ref 4.0–10.5)

## 2016-01-10 LAB — TYPE AND SCREEN
ABO/RH(D): A POS
Antibody Screen: NEGATIVE
UNIT DIVISION: 0
UNIT DIVISION: 0
Unit division: 0
Unit division: 0
Unit division: 0
Unit division: 0

## 2016-01-10 LAB — GLUCOSE, CAPILLARY
GLUCOSE-CAPILLARY: 126 mg/dL — AB (ref 65–99)
GLUCOSE-CAPILLARY: 161 mg/dL — AB (ref 65–99)
GLUCOSE-CAPILLARY: 105 mg/dL — AB (ref 65–99)
GLUCOSE-CAPILLARY: 107 mg/dL — AB (ref 65–99)
Glucose-Capillary: 120 mg/dL — ABNORMAL HIGH (ref 65–99)
Glucose-Capillary: 138 mg/dL — ABNORMAL HIGH (ref 65–99)

## 2016-01-10 LAB — BASIC METABOLIC PANEL
Anion gap: 8 (ref 5–15)
BUN: 35 mg/dL — AB (ref 6–20)
CO2: 28 mmol/L (ref 22–32)
CREATININE: 1.24 mg/dL (ref 0.61–1.24)
Calcium: 8.4 mg/dL — ABNORMAL LOW (ref 8.9–10.3)
Chloride: 99 mmol/L — ABNORMAL LOW (ref 101–111)
GFR calc Af Amer: >60 mL/min (ref >60)
Glucose, Bld: 170 mg/dL — ABNORMAL HIGH (ref 65–99)
POTASSIUM: 5.4 mmol/L — AB (ref 3.5–5.1)
SODIUM: 135 mmol/L (ref 135–145)

## 2016-01-10 LAB — PROTIME-INR
INR: 1.1 (ref 0.00–1.49)
Prothrombin Time: 14.4 seconds (ref 11.6–15.2)

## 2016-01-10 MED ORDER — ASPIRIN EC 81 MG PO TBEC
81.0000 mg | DELAYED_RELEASE_TABLET | Freq: Every day | ORAL | Status: DC
  Administered 2016-01-10: 81 mg via ORAL

## 2016-01-10 MED ORDER — WARFARIN SODIUM 5 MG PO TABS
5.0000 mg | ORAL_TABLET | Freq: Once | ORAL | Status: AC
  Administered 2016-01-10: 5 mg via ORAL
  Filled 2016-01-10: qty 1

## 2016-01-10 NOTE — Progress Notes (Signed)
3 Days Post-Op Procedure(s) (LRB): CORONARY ARTERY BYPASS GRAFTING (CABG), ON PUMP, TIMES FOUR, USING LEFT INTERNAL MAMMARY ARTERY, BILATERAL GREATER SAPHENOUS VEINS HARVESTED ENDOSCOPICALLY (N/A) TRANSESOPHAGEAL ECHOCARDIOGRAM (TEE) (N/A) CIRC ARREST AND REPLACEMENT OF ASCENDING THORACIC  ANEURYSM (N/A) Subjective:  No complaints.  Had vagal episode after getting up from commode last pm and says he just about passed out.  Objective: Vital signs in last 24 hours: Temp:  [97.1 F (36.2 C)-97.8 F (36.6 C)] 97.6 F (36.4 C) (03/05 0714) Pulse Rate:  [46-105] 105 (03/05 0900) Cardiac Rhythm:  [-] Atrial fibrillation (03/05 0900) Resp:  [12-27] 25 (03/05 0900) BP: (77-132)/(58-88) 127/70 mmHg (03/05 0900) SpO2:  [91 %-100 %] 94 % (03/05 0900) Weight:  [72 kg (158 lb 11.7 oz)] 72 kg (158 lb 11.7 oz) (03/05 0234)  Hemodynamic parameters for last 24 hours:    Intake/Output from previous day: 03/04 0701 - 03/05 0700 In: 266.8 [P.O.:250; I.V.:16.8] Out: 620 [Urine:520; Chest Tube:100] Intake/Output this shift: Total I/O In: 100 [P.O.:100] Out: 45 [Urine:45]  General appearance: alert and cooperative Neurologic: intact Heart: irregularly irregular rhythm and no murmur Lungs: diminished breath sounds bibasilar Extremities: edema mild Wound: dressings dry  Lab Results:  Recent Labs  01/09/16 0444 01/10/16 0308  WBC 21.7* 22.1*  HGB 9.0* 8.7*  HCT 26.8* 26.9*  PLT 93* 83*   BMET:  Recent Labs  01/09/16 0444 01/10/16 0308  NA 136 135  K 4.8 5.4*  CL 101 99*  CO2 27 28  GLUCOSE 181* 170*  BUN 23* 35*  CREATININE 1.15 1.24  CALCIUM 8.0* 8.4*    PT/INR:  Recent Labs  01/10/16 0308  LABPROT 14.4  INR 1.10   ABG    Component Value Date/Time   PHART 7.417 01/08/2016 1214   HCO3 26.0* 01/08/2016 1214   TCO2 26 01/08/2016 1632   ACIDBASEDEF 1.0 12/29/2015 1415   O2SAT 93.0 01/08/2016 1214   CBG (last 3)   Recent Labs  01/09/16 2312 01/10/16 0344  01/10/16 0709  GLUCAP 126* 161* 120*   CLINICAL DATA: Post AVR 01/07/2016, history COPD, former smoker, hypertension, hyperlipidemia  EXAM: PORTABLE CHEST 1 VIEW  COMPARISON: Portable exam 0551 hours compared to 01/09/2016  FINDINGS: Interval removal of mediastinal drains, LEFT thoracostomy tube, and RIGHT jugular catheter.  Enlargement of cardiac silhouette post AVR.  Atherosclerotic calcification aorta.  Persistent effusion and atelectasis at RIGHT base.  Underlying COPD changes.  No pleural effusion or pneumothorax.  Old BILATERAL rib fractures.  IMPRESSION: COPD changes with persistent small RIGHT pleural effusion and RIGHT basilar atelectasis.   Electronically Signed  By: Ulyses SouthwardMark Boles M.D.  On: 01/10/2016 07:43  Assessment/Plan: S/P Procedure(s) (LRB): CORONARY ARTERY BYPASS GRAFTING (CABG), ON PUMP, TIMES FOUR, USING LEFT INTERNAL MAMMARY ARTERY, BILATERAL GREATER SAPHENOUS VEINS HARVESTED ENDOSCOPICALLY (N/A) TRANSESOPHAGEAL ECHOCARDIOGRAM (TEE) (N/A) CIRC ARREST AND REPLACEMENT OF ASCENDING THORACIC  ANEURYSM (N/A)  He is hemodynamically stable off pressors and inotropes  Postop atrial fibrillation with controlled rate. His rate was in the 60's yesterday and now 90's to 100. Will observe. Coumadin started.  Renal function stable but creat up slightly. Will hold off on diuresis today. Weight is still 19 lbs over preop.  Severe COPD: continue IS, ambulation.   LOS: 15 days    Alleen BorneBryan K Bartle 01/10/2016

## 2016-01-10 NOTE — Progress Notes (Signed)
While pt on bedside commode vagaled down and had brief change in loc. Pause noted on ecg and strip printed for chart. Pt was immediately placed back in bed with a return in loc and VVI paced at 80. Vital signs WNL once back in bed. Pacer changed to a back up rate of 60 due to competition with intrinsic rhythm of AFIB in the 80's. Will continue to monitor closely. Modena JanskyKevin Jelina Paulsen RN [address] [facility]

## 2016-01-11 ENCOUNTER — Inpatient Hospital Stay (HOSPITAL_COMMUNITY): Payer: Medicaid Other

## 2016-01-11 ENCOUNTER — Inpatient Hospital Stay (HOSPITAL_COMMUNITY): Payer: Medicaid Other | Admitting: Anesthesiology

## 2016-01-11 DIAGNOSIS — J9600 Acute respiratory failure, unspecified whether with hypoxia or hypercapnia: Secondary | ICD-10-CM

## 2016-01-11 DIAGNOSIS — N179 Acute kidney failure, unspecified: Secondary | ICD-10-CM | POA: Insufficient documentation

## 2016-01-11 DIAGNOSIS — N170 Acute kidney failure with tubular necrosis: Secondary | ICD-10-CM

## 2016-01-11 LAB — BASIC METABOLIC PANEL
Anion gap: 23 — ABNORMAL HIGH (ref 5–15)
Anion gap: 18 — ABNORMAL HIGH (ref 5–15)
Anion gap: 14 (ref 5–15)
Anion gap: 9 (ref 5–15)
BUN: 57 mg/dL — AB (ref 6–20)
BUN: 57 mg/dL — ABNORMAL HIGH (ref 6–20)
BUN: 58 mg/dL — ABNORMAL HIGH (ref 6–20)
BUN: 69 mg/dL — ABNORMAL HIGH (ref 6–20)
CALCIUM: 7.5 mg/dL — AB (ref 8.9–10.3)
CALCIUM: 7.4 mg/dL — AB (ref 8.9–10.3)
CALCIUM: 7.3 mg/dL — AB (ref 8.9–10.3)
CHLORIDE: 100 mmol/L — AB (ref 101–111)
CO2: 17 mmol/L — ABNORMAL LOW (ref 22–32)
CO2: 23 mmol/L (ref 22–32)
CO2: 25 mmol/L (ref 22–32)
CO2: 29 mmol/L (ref 22–32)
CREATININE: 2.31 mg/dL — AB (ref 0.61–1.24)
CREATININE: 2.35 mg/dL — AB (ref 0.61–1.24)
CREATININE: 2.23 mg/dL — AB (ref 0.61–1.24)
Calcium: 7.8 mg/dL — ABNORMAL LOW (ref 8.9–10.3)
Chloride: 100 mmol/L — ABNORMAL LOW (ref 101–111)
Chloride: 102 mmol/L (ref 101–111)
Chloride: 102 mmol/L (ref 101–111)
Creatinine, Ser: 2.36 mg/dL — ABNORMAL HIGH (ref 0.61–1.24)
GFR calc non Af Amer: 27 mL/min — ABNORMAL LOW (ref >60)
GFR calc non Af Amer: 28 mL/min — ABNORMAL LOW (ref >60)
GFR calc non Af Amer: 28 mL/min — ABNORMAL LOW (ref >60)
GFR calc non Af Amer: 29 mL/min — ABNORMAL LOW (ref >60)
GFR, EST AFRICAN AMERICAN: 32 mL/min — AB (ref >60)
GFR, EST AFRICAN AMERICAN: 33 mL/min — AB (ref >60)
GFR, EST AFRICAN AMERICAN: 32 mL/min — AB (ref >60)
GFR, EST AFRICAN AMERICAN: 34 mL/min — AB (ref >60)
Glucose, Bld: 125 mg/dL — ABNORMAL HIGH (ref 65–99)
Glucose, Bld: 160 mg/dL — ABNORMAL HIGH (ref 65–99)
Glucose, Bld: 183 mg/dL — ABNORMAL HIGH (ref 65–99)
Glucose, Bld: 134 mg/dL — ABNORMAL HIGH (ref 65–99)
POTASSIUM: 6.2 mmol/L — AB (ref 3.5–5.1)
Potassium: 6 mmol/L — ABNORMAL HIGH (ref 3.5–5.1)
Potassium: 5.6 mmol/L — ABNORMAL HIGH (ref 3.5–5.1)
Potassium: 4.5 mmol/L (ref 3.5–5.1)
SODIUM: 140 mmol/L (ref 135–145)
SODIUM: 141 mmol/L (ref 135–145)
SODIUM: 141 mmol/L (ref 135–145)
SODIUM: 140 mmol/L (ref 135–145)

## 2016-01-11 LAB — POCT I-STAT 3, ART BLOOD GAS (G3+)
ACID-BASE DEFICIT: 7 mmol/L — AB (ref 0.0–2.0)
Acid-Base Excess: 6 mmol/L — ABNORMAL HIGH (ref 0.0–2.0)
Acid-base deficit: 7 mmol/L — ABNORMAL HIGH (ref 0.0–2.0)
Acid-base deficit: 3 mmol/L — ABNORMAL HIGH (ref 0.0–2.0)
BICARBONATE: 18.8 meq/L — AB (ref 20.0–24.0)
BICARBONATE: 22.5 meq/L (ref 20.0–24.0)
Bicarbonate: 19.3 mEq/L — ABNORMAL LOW (ref 20.0–24.0)
Bicarbonate: 30.5 mEq/L — ABNORMAL HIGH (ref 20.0–24.0)
O2 SAT: 99 %
O2 Saturation: 95 %
O2 Saturation: 94 %
O2 Saturation: 98 %
PCO2 ART: 41.2 mmHg (ref 35.0–45.0)
PCO2 ART: 41.7 mmHg (ref 35.0–45.0)
PCO2 ART: 43.7 mmHg (ref 35.0–45.0)
PH ART: 7.276 — AB (ref 7.350–7.450)
PH ART: 7.334 — AB (ref 7.350–7.450)
PH ART: 7.45 (ref 7.350–7.450)
PO2 ART: 151 mmHg — AB (ref 80.0–100.0)
PO2 ART: 77 mmHg — AB (ref 80.0–100.0)
Patient temperature: 36
Patient temperature: 36.3
Patient temperature: 36.6
TCO2: 20 mmol/L (ref 0–100)
TCO2: 21 mmol/L (ref 0–100)
TCO2: 24 mmol/L (ref 0–100)
TCO2: 32 mmol/L (ref 0–100)
pCO2 arterial: 37.5 mmHg (ref 35.0–45.0)
pH, Arterial: 7.305 — ABNORMAL LOW (ref 7.350–7.450)
pO2, Arterial: 78 mmHg — ABNORMAL LOW (ref 80.0–100.0)
pO2, Arterial: 97 mmHg (ref 80.0–100.0)

## 2016-01-11 LAB — GLUCOSE, CAPILLARY
GLUCOSE-CAPILLARY: 104 mg/dL — AB (ref 65–99)
GLUCOSE-CAPILLARY: 124 mg/dL — AB (ref 65–99)
GLUCOSE-CAPILLARY: 159 mg/dL — AB (ref 65–99)
Glucose-Capillary: 104 mg/dL — ABNORMAL HIGH (ref 65–99)
Glucose-Capillary: 152 mg/dL — ABNORMAL HIGH (ref 65–99)
Glucose-Capillary: 49 mg/dL — ABNORMAL LOW (ref 65–99)
Glucose-Capillary: 113 mg/dL — ABNORMAL HIGH (ref 65–99)

## 2016-01-11 LAB — CBC
HCT: 24.5 % — ABNORMAL LOW (ref 39.0–52.0)
HCT: 18.9 % — ABNORMAL LOW (ref 39.0–52.0)
HEMATOCRIT: 19 % — AB (ref 39.0–52.0)
HEMOGLOBIN: 8.1 g/dL — AB (ref 13.0–17.0)
HEMOGLOBIN: 6.2 g/dL — AB (ref 13.0–17.0)
HEMOGLOBIN: 6.2 g/dL — AB (ref 13.0–17.0)
MCH: 31 pg (ref 26.0–34.0)
MCH: 30.8 pg (ref 26.0–34.0)
MCH: 30 pg (ref 26.0–34.0)
MCHC: 33.1 g/dL (ref 30.0–36.0)
MCHC: 32.8 g/dL (ref 30.0–36.0)
MCHC: 32.6 g/dL (ref 30.0–36.0)
MCV: 93.9 fL (ref 78.0–100.0)
MCV: 94 fL (ref 78.0–100.0)
MCV: 91.8 fL (ref 78.0–100.0)
PLATELETS: 79 10*3/uL — AB (ref 150–400)
Platelets: 64 10*3/uL — ABNORMAL LOW (ref 150–400)
Platelets: 56 10*3/uL — ABNORMAL LOW (ref 150–400)
RBC: 2.61 MIL/uL — AB (ref 4.22–5.81)
RBC: 2.01 MIL/uL — AB (ref 4.22–5.81)
RBC: 2.07 MIL/uL — ABNORMAL LOW (ref 4.22–5.81)
RDW: 16.5 % — ABNORMAL HIGH (ref 11.5–15.5)
RDW: 16.3 % — ABNORMAL HIGH (ref 11.5–15.5)
RDW: 16 % — ABNORMAL HIGH (ref 11.5–15.5)
WBC: 22.8 10*3/uL — ABNORMAL HIGH (ref 4.0–10.5)
WBC: 22.7 10*3/uL — ABNORMAL HIGH (ref 4.0–10.5)
WBC: 21.9 10*3/uL — ABNORMAL HIGH (ref 4.0–10.5)

## 2016-01-11 LAB — TROPONIN I: Troponin I: 7.41 ng/mL (ref <0.031)

## 2016-01-11 LAB — POCT I-STAT 3, VENOUS BLOOD GAS (G3P V)
ACID-BASE EXCESS: 1 mmol/L (ref 0.0–2.0)
BICARBONATE: 26.7 meq/L — AB (ref 20.0–24.0)
O2 SAT: 60 %
PH VEN: 7.337 — AB (ref 7.250–7.300)
PO2 VEN: 34 mmHg (ref 30.0–45.0)
Patient temperature: 36.9
TCO2: 28 mmol/L (ref 0–100)
pCO2, Ven: 49.8 mmHg (ref 45.0–50.0)

## 2016-01-11 LAB — POCT I-STAT, CHEM 8
BUN: 56 mg/dL — ABNORMAL HIGH (ref 6–20)
CHLORIDE: 98 mmol/L — AB (ref 101–111)
Calcium, Ion: 0.98 mmol/L — ABNORMAL LOW (ref 1.13–1.30)
Creatinine, Ser: 2.4 mg/dL — ABNORMAL HIGH (ref 0.61–1.24)
Glucose, Bld: 117 mg/dL — ABNORMAL HIGH (ref 65–99)
HEMATOCRIT: 18 % — AB (ref 39.0–52.0)
Hemoglobin: 6.1 g/dL — CL (ref 13.0–17.0)
POTASSIUM: 5.5 mmol/L — AB (ref 3.5–5.1)
SODIUM: 135 mmol/L (ref 135–145)
TCO2: 23 mmol/L (ref 0–100)

## 2016-01-11 LAB — URINALYSIS W MICROSCOPIC (NOT AT ARMC)
BILIRUBIN URINE: NEGATIVE
Glucose, UA: NEGATIVE mg/dL
KETONES UR: NEGATIVE mg/dL
Nitrite: NEGATIVE
Protein, ur: 100 mg/dL — AB
SPECIFIC GRAVITY, URINE: 1.021 (ref 1.005–1.030)
pH: 5 (ref 5.0–8.0)

## 2016-01-11 LAB — COMPREHENSIVE METABOLIC PANEL
ALT: 1574 U/L — ABNORMAL HIGH (ref 17–63)
ANION GAP: 19 — AB (ref 5–15)
AST: 1232 U/L — ABNORMAL HIGH (ref 15–41)
Albumin: 2.6 g/dL — ABNORMAL LOW (ref 3.5–5.0)
Alkaline Phosphatase: 49 U/L (ref 38–126)
BUN: 54 mg/dL — ABNORMAL HIGH (ref 6–20)
CALCIUM: 8.5 mg/dL — AB (ref 8.9–10.3)
CO2: 22 mmol/L (ref 22–32)
Chloride: 96 mmol/L — ABNORMAL LOW (ref 101–111)
Creatinine, Ser: 2.14 mg/dL — ABNORMAL HIGH (ref 0.61–1.24)
GFR calc non Af Amer: 31 mL/min — ABNORMAL LOW (ref >60)
GFR, EST AFRICAN AMERICAN: 36 mL/min — AB (ref >60)
Glucose, Bld: 125 mg/dL — ABNORMAL HIGH (ref 65–99)
Potassium: 6.7 mmol/L (ref 3.5–5.1)
SODIUM: 137 mmol/L (ref 135–145)
Total Bilirubin: 1.5 mg/dL — ABNORMAL HIGH (ref 0.3–1.2)
Total Protein: 5 g/dL — ABNORMAL LOW (ref 6.5–8.1)

## 2016-01-11 LAB — PROCALCITONIN: Procalcitonin: 1.09 ng/mL

## 2016-01-11 LAB — LACTIC ACID, PLASMA
LACTIC ACID, VENOUS: 13.6 mmol/L — AB (ref 0.5–2.0)
LACTIC ACID, VENOUS: 2.9 mmol/L — AB (ref 0.5–2.0)
Lactic Acid, Venous: 9.5 mmol/L (ref 0.5–2.0)

## 2016-01-11 LAB — LIPASE, BLOOD: LIPASE: 42 U/L (ref 11–51)

## 2016-01-11 LAB — PREPARE RBC (CROSSMATCH)

## 2016-01-11 LAB — CORTISOL: Cortisol, Plasma: 62.5 ug/dL

## 2016-01-11 LAB — AMYLASE: Amylase: 78 U/L (ref 28–100)

## 2016-01-11 LAB — CARBOXYHEMOGLOBIN
CARBOXYHEMOGLOBIN: 1 % (ref 0.5–1.5)
Methemoglobin: 0.7 % (ref 0.0–1.5)
O2 Saturation: 44.7 %
Total hemoglobin: 14.3 g/dL (ref 13.5–18.0)

## 2016-01-11 LAB — PROTIME-INR
INR: 2 — AB (ref 0.00–1.49)
PROTHROMBIN TIME: 22.6 s — AB (ref 11.6–15.2)

## 2016-01-11 LAB — LACTATE DEHYDROGENASE: LDH: 6494 U/L — ABNORMAL HIGH (ref 98–192)

## 2016-01-11 MED ORDER — VANCOMYCIN HCL IN DEXTROSE 1-5 GM/200ML-% IV SOLN
1000.0000 mg | INTRAVENOUS | Status: DC
  Administered 2016-01-12 – 2016-01-17 (×6): 1000 mg via INTRAVENOUS
  Filled 2016-01-11 (×7): qty 200

## 2016-01-11 MED ORDER — VANCOMYCIN HCL 10 G IV SOLR
1500.0000 mg | Freq: Once | INTRAVENOUS | Status: AC
  Administered 2016-01-11: 1500 mg via INTRAVENOUS
  Filled 2016-01-11: qty 1500

## 2016-01-11 MED ORDER — FENTANYL CITRATE (PF) 100 MCG/2ML IJ SOLN
50.0000 ug | Freq: Once | INTRAMUSCULAR | Status: AC
  Administered 2016-01-11: 50 ug via INTRAVENOUS

## 2016-01-11 MED ORDER — IOHEXOL 300 MG/ML  SOLN
25.0000 mL | INTRAMUSCULAR | Status: AC
  Administered 2016-01-11 (×2): 25 mL via ORAL

## 2016-01-11 MED ORDER — ALBUMIN HUMAN 5 % IV SOLN
INTRAVENOUS | Status: AC
  Administered 2016-01-11: 12.5 g
  Filled 2016-01-11: qty 250

## 2016-01-11 MED ORDER — SODIUM BICARBONATE 8.4 % IV SOLN
INTRAVENOUS | Status: AC
  Filled 2016-01-11: qty 50

## 2016-01-11 MED ORDER — DEXTROSE 5 % IV SOLN
INTRAVENOUS | Status: DC
  Administered 2016-01-11 – 2016-01-12 (×2): via INTRAVENOUS
  Filled 2016-01-11 (×2): qty 150

## 2016-01-11 MED ORDER — MIDAZOLAM HCL 2 MG/2ML IJ SOLN
2.0000 mg | INTRAMUSCULAR | Status: DC | PRN
  Filled 2016-01-11: qty 2

## 2016-01-11 MED ORDER — ANTISEPTIC ORAL RINSE SOLUTION (CORINZ)
7.0000 mL | Freq: Four times a day (QID) | OROMUCOSAL | Status: DC
  Administered 2016-01-12 (×4): 7 mL via OROMUCOSAL

## 2016-01-11 MED ORDER — DOPAMINE-DEXTROSE 3.2-5 MG/ML-% IV SOLN
3.0000 ug/kg/min | INTRAVENOUS | Status: DC
  Administered 2016-01-11: 3 ug/kg/min via INTRAVENOUS
  Filled 2016-01-11: qty 250

## 2016-01-11 MED ORDER — SODIUM CHLORIDE 0.9 % IV SOLN
25.0000 ug/h | INTRAVENOUS | Status: DC
  Administered 2016-01-11: 250 ug/h via INTRAVENOUS
  Administered 2016-01-11: 50 ug/h via INTRAVENOUS
  Administered 2016-01-12: 300 ug/h via INTRAVENOUS
  Filled 2016-01-11 (×3): qty 50

## 2016-01-11 MED ORDER — BUDESONIDE 0.25 MG/2ML IN SUSP: 0.2500 mg | Freq: Four times a day (QID) | RESPIRATORY_TRACT | Status: DC

## 2016-01-11 MED ORDER — ALBUMIN HUMAN 25 % IV SOLN: 12.5000 g | Freq: Once | INTRAVENOUS | Status: AC

## 2016-01-11 MED ORDER — CHLORHEXIDINE GLUCONATE 0.12% ORAL RINSE (MEDLINE KIT)
15.0000 mL | Freq: Two times a day (BID) | OROMUCOSAL | Status: DC
  Administered 2016-01-11 – 2016-01-12 (×2): 15 mL via OROMUCOSAL

## 2016-01-11 MED ORDER — SODIUM BICARBONATE 8.4 % IV SOLN
100.0000 meq | Freq: Once | INTRAVENOUS | Status: AC
  Administered 2016-01-11: 50 meq via INTRAVENOUS

## 2016-01-11 MED ORDER — LIDOCAINE HCL (CARDIAC) 20 MG/ML IV SOLN
INTRAVENOUS | Status: DC | PRN
  Administered 2016-01-11: 50 mg via INTRAVENOUS

## 2016-01-11 MED ORDER — ETOMIDATE 2 MG/ML IV SOLN
INTRAVENOUS | Status: DC | PRN
  Administered 2016-01-11: 12 mg via INTRAVENOUS

## 2016-01-11 MED ORDER — SODIUM BICARBONATE 8.4 % IV SOLN
100.0000 meq | Freq: Once | INTRAVENOUS | Status: AC
  Administered 2016-01-11: 100 meq via INTRAVENOUS

## 2016-01-11 MED ORDER — PROPOFOL 10 MG/ML IV BOLUS
INTRAVENOUS | Status: DC | PRN
  Administered 2016-01-11: 10 mg via INTRAVENOUS

## 2016-01-11 MED ORDER — SODIUM CHLORIDE 0.9 % IV SOLN
Freq: Once | INTRAVENOUS | Status: AC
  Administered 2016-01-11: 08:00:00 via INTRAVENOUS

## 2016-01-11 MED ORDER — SODIUM POLYSTYRENE SULFONATE 15 GM/60ML PO SUSP
30.0000 g | Freq: Once | ORAL | Status: AC
  Administered 2016-01-11: 30 g
  Filled 2016-01-11: qty 120

## 2016-01-11 MED ORDER — SODIUM BICARBONATE 8.4 % IV SOLN
INTRAVENOUS | Status: AC
  Administered 2016-01-11: 50 meq
  Filled 2016-01-11: qty 50

## 2016-01-11 MED ORDER — BUDESONIDE 0.25 MG/2ML IN SUSP
0.2500 mg | Freq: Four times a day (QID) | RESPIRATORY_TRACT | Status: DC
  Administered 2016-01-11 – 2016-01-12 (×5): 0.25 mg via RESPIRATORY_TRACT
  Filled 2016-01-11 (×5): qty 2

## 2016-01-11 MED ORDER — SODIUM POLYSTYRENE SULFONATE 15 GM/60ML PO SUSP
30.0000 g | Freq: Once | ORAL | Status: AC
  Administered 2016-01-11: 30 g via ORAL
  Filled 2016-01-11: qty 120

## 2016-01-11 MED ORDER — PERFLUTREN LIPID MICROSPHERE
1.0000 mL | INTRAVENOUS | Status: AC | PRN
  Administered 2016-01-11: 7 mL via INTRAVENOUS
  Filled 2016-01-11: qty 10

## 2016-01-11 MED ORDER — FENTANYL BOLUS VIA INFUSION
50.0000 ug | INTRAVENOUS | Status: DC | PRN
  Filled 2016-01-11: qty 50

## 2016-01-11 MED ORDER — DEXMEDETOMIDINE HCL IN NACL 200 MCG/50ML IV SOLN
0.4000 ug/kg/h | INTRAVENOUS | Status: DC
  Administered 2016-01-11: 0.7 ug/kg/h via INTRAVENOUS
  Filled 2016-01-11 (×2): qty 50

## 2016-01-11 MED ORDER — SODIUM CHLORIDE 0.9 % IV SOLN: INTRAVENOUS | Status: DC | PRN

## 2016-01-11 MED ORDER — SODIUM CHLORIDE 0.9 % IV SOLN
250.0000 mg | Freq: Four times a day (QID) | INTRAVENOUS | Status: DC
  Administered 2016-01-11 – 2016-01-15 (×17): 250 mg via INTRAVENOUS
  Filled 2016-01-11 (×20): qty 250

## 2016-01-11 MED FILL — Sodium Bicarbonate IV Soln 8.4%: INTRAVENOUS | Qty: 150 | Status: AC

## 2016-01-11 MED FILL — Heparin Sodium (Porcine) Inj 1000 Unit/ML: INTRAMUSCULAR | Qty: 20 | Status: AC

## 2016-01-11 MED FILL — Lidocaine HCl IV Inj 20 MG/ML: INTRAVENOUS | Qty: 5 | Status: AC

## 2016-01-11 MED FILL — Sodium Chloride IV Soln 0.9%: INTRAVENOUS | Qty: 2000 | Status: AC

## 2016-01-11 MED FILL — Electrolyte-R (PH 7.4) Solution: INTRAVENOUS | Qty: 7000 | Status: AC

## 2016-01-11 NOTE — Progress Notes (Addendum)
PCCM PROGRESS NOTE  ADMISSION DATE: 12/26/2015 CONSULT DATE: 12/27/2015 REFERRING PROVIDER: [facility]ad  CC: Short of breath  SUBJECTIVE: Shock, new acute resp failure  VITAL SIGNS: BP 81/42 mmHg  Pulse 108  Temp(Src) 97 F (36.1 C) (Axillary)  Resp 17  Ht '5\' 9"'  (1.753 m)  Wt 72 kg (158 lb 11.7 oz)  BMI 23.43 kg/m2  SpO2 98%  INTAKE/OUTPUT: I/O last 3 completed shifts: In: 950 [P.O.:950] Out: 770 [Urine:770]  General: alert on vent HEENT: jvd down but present Cardiac: regular, 2/6 SM, wound dressing clean Chest: no wheeze, CTA, some reduced rt base Abd: soft, non tender, some brusing, no r/g, BS wnl, mild brusing Ext: 1+ edema Neuro: normal strength Skin: no rashes   CBC Recent Labs     01/10/16  0308  01/11/16  0138  01/11/16  0637  01/11/16  0638  WBC  22.1*  22.8*   --   22.7*  HGB  8.7*  8.1*  6.1*  6.2*  HCT  26.9*  24.5*  18.0*  18.9*  PLT  83*  79*   --   64*    Coag's Recent Labs     01/10/16  0308  01/11/16  0138  INR  1.10  2.00*    BMET Recent Labs     01/10/16  0308  01/11/16  0138  01/11/16  0637  01/11/16  0638  NA  135  137  135  140  K  5.4*  6.7*  5.5*  6.2*  CL  99*  96*  98*  100*  CO2  28  22   --   17*  BUN  35*  54*  56*  57*  CREATININE  1.24  2.14*  2.40*  2.36*  GLUCOSE  170*  125*  117*  125*    Electrolytes Recent Labs     01/08/16  1635   01/10/16  0308  01/11/16  0138  01/11/16  4782  CALCIUM   --    < >  8.4*  8.5*  7.8*  MG  2.5*   --    --    --    --    < > = values in this interval not displayed.    ABG Recent Labs     01/11/16  0402  01/11/16  0642  01/11/16  0728  PHART  7.305*  7.276*  7.334*  PCO2ART  37.5  41.2  41.7  PO2ART  78.0*  77.0*  151.0*    Glucose Recent Labs     01/10/16  0709  01/10/16  1250  01/10/16  1603  01/10/16  2036  01/10/16  2347  01/11/16  0426  GLUCAP  120*  138*  105*  107*  104*  104*    Imaging Dg Chest Port 1 View  01/11/2016  CLINICAL DATA:  Status  post intubation EXAM: PORTABLE CHEST 1 VIEW COMPARISON:  Film from earlier in the same day FINDINGS: Cardiac shadow is stable. An endotracheal tube is now seen approximately 6.7 cm above the carina. A nasogastric catheter is noted coiled within the stomach. The left subclavian line is again seen. Small bilateral pleural effusions are noted, right greater than left. The overall appearance is stable from the previous exam. Old rib fractures are noted. IMPRESSION: Bilateral pleural effusions. Tubes and lines as described in satisfactory position. Electronically Signed   By: Inez Catalina M.D.   On: 01/11/2016 07:05   Dg Chest [facility]  01/11/2016  CLINICAL DATA:  Central line placement. EXAM: PORTABLE CHEST 1 VIEW COMPARISON:  01/11/2016 FINDINGS: Postoperative changes in the mediastinum. Cardiac enlargement without vascular congestion. Small right pleural effusion with infiltration or atelectasis in the right lung base may indicate pneumonia. Left costophrenic angle is not included within the field of view. The left pleural effusions seen on previous study is not included. Left central venous catheter with tip over the upper SVC region. Old bilateral rib fractures. Tortuous aorta. IMPRESSION: Right pleural effusion with infiltration or atelectasis in the right lung base. Previous left pleural effusion is not included within the field of view. Cardiac enlargement without vascular congestion. Electronically Signed   By: Lucienne Capers M.D.   On: 01/11/2016 06:32   Dg Chest Port 1 View  01/11/2016  CLINICAL DATA:  Initial evaluation for acute shortness of breath, to saturation. EXAM: PORTABLE CHEST 1 VIEW COMPARISON:  Prior study from 01/10/2016. FINDINGS: Patient is rotated to the right. Median sternotomy wires with underlying prostatic valve again noted, stable. Cardiomegaly unchanged. Mediastinal silhouette is stable. Interval worsening and bilateral pleural effusions as compared to prior. Associated bibasilar  opacities also worsen, which may reflect atelectasis or infiltrates. Changes related COPD again noted. No pulmonary edema. No pneumothorax. Osseous structures unchanged. Remote left-sided rib fractures noted. IMPRESSION: 1. Interval worsening and bilateral pleural effusions, right worse than left, with worsened bibasilar opacities, likely atelectasis. 2. COPD. Electronically Signed   By: Jeannine Boga M.D.   On: 01/11/2016 05:33   Dg Chest Port 1 View  01/10/2016  CLINICAL DATA:  Post AVR 01/07/2016, history COPD, former smoker, hypertension, hyperlipidemia EXAM: PORTABLE CHEST 1 VIEW COMPARISON:  Portable exam 0551 hours compared to 01/09/2016 FINDINGS: Interval removal of mediastinal drains, LEFT thoracostomy tube, and RIGHT jugular catheter. Enlargement of cardiac silhouette post AVR. Atherosclerotic calcification aorta. Persistent effusion and atelectasis at RIGHT base. Underlying COPD changes. No pleural effusion or pneumothorax. Old BILATERAL rib fractures. IMPRESSION: COPD changes with persistent small RIGHT pleural effusion and RIGHT basilar atelectasis. Electronically Signed   By: Lavonia Dana M.D.   On: 01/10/2016 07:43   CULTURES: 2/19 Respiratory viral panel >> negative 2/19 Sputum >> oral flora 2/19 Pneumococcal ag >> negative 3/6 BC>>> 3/6 sputum>>>  ANTIBIOTICS: 2/18 Zithromax >> 2/21 2/18 Rocephin >> 2/24 3/02 Zinacef>>>off IMipenem 3/6>>> vanc 3/6>>>  STUDIES: 2/18 CT chest >> emphysema, consolidation RLL 2/20 Echo >> EF 35 to 40%, severe AS, 42 mm aortic root, mild MR 2/27 PFT >> FEV1 1.26 (37%), FEV1% 34, TLC 6.17 (90%), DLCO 26%, no BD 3/6 echo>>> [place] abdo 3/6>>>  EVENTS: 2/18 Admit, cardiology consulted 2/19 To ICU, VDRF 2/21 Rt/Lt heart cath, TCTS consulted 2/26 PAF 3/02 CABG, AVR, replacement of ascending thoracic aortic aneurysm 3/03 Transfuse PRBC 3/6- resp failure, lactic acidosis, ARF, shock liver, ETT  LINES/TUBES: 2/19 ETT >> 2/19 (self  extubated) 3/02 ETT >> 3/03 3/02 Rt radial a line >>3/4 3/02 Rt IJ introducer >>3.4 3/6  line>>> 3/6 left fem aline>>>  DISCUSSION: 65 yo male former smoker presented with fever, cough, dyspnea, chills from pneumonia.  He was found to have troponin elevation.  He has hx of COPD/emphysema.  ASSESSMENT/PLAN:  PULMONARY A: Acute on chronic hypoxic/hypercapnic respiratory failure. COPD/emphysema with exacerbation. Uncompensated met acidosis Rt effusion P: If able CT chest assess rt effusion, vs empiric ct to rt Slight rate increase, ensure normal ph with K rise NO SBT pcxr in am  If able avoid rates greater 24 with autopeep risk copd  CARDIOLOGY A: NSTEMI. Severe AS and severe CAD s/p CABG and AVR 3/02. Acute on chronic systolic/diastoli NEW shock, r/;o septic with hypothermia, r;o tamponade c CHF with acute pulmonary edema. PAF >> sinus rhythm now. HLD. P: Dopamine to max 7 mics, if tachy / fib transition to neo, low threshold cvp 18, assess pa pressures to interpret Get cortisol stat Allow pos balance Re assess svo2 Stat echo, I have reviewed , poor images, await read  RENAL A: ARF, likely ATN, hyperkalemia P: Assess for hydro Get UA Allow pos balance bmet q6h kayxlate Bicarb drip for k  Image for hydro Ensure NO LR with k rise  GASTROENTEROLOGY A: Nutrition. Shock liver likely R/o bowel ischemia Doubt retroper bleed P: NPO Consider CT abdo if stable for this with oral contrast, follow lactic first and response to fluids Protonix for SUP Empiric abdo coverage If unable to CT would consider [place] assessment Assess amy, lip Cbc follow after blood  HEMATOLOGY A: Anemia of critical illness. coagulapthy from coumadin P: F/u CBC Transfuse per TCTS, agree for 2 units now Repeat coags scd  INFECTION A: CAP >> completed Abx 2/24. Hypothermia, r/o new source sepsis P: Add empiric Imipenem, vanc STAT Blood cult sputum CT if able [place] abdo  now lacic follow up Low threshold empiric antifungals Obtain pct trend  ENDOCRINE A: R/o rel AI P: Monitor blood sugars cortisol  NEUROLOGY A: Post-op pain control. Vent needs P: Dc precedex Add fent drip WUA okay  Ccm time 60 min  Lavon Paganini. Titus Mould, MD, [place] Pgr: [facility]

## 2016-01-11 NOTE — Progress Notes (Signed)
4 Days Post-Op Procedure(s) (LRB): CORONARY ARTERY BYPASS GRAFTING (CABG), ON PUMP, TIMES FOUR, USING LEFT INTERNAL MAMMARY ARTERY, BILATERAL GREATER SAPHENOUS VEINS HARVESTED ENDOSCOPICALLY (N/A) TRANSESOPHAGEAL ECHOCARDIOGRAM (TEE) (N/A) CIRC ARREST AND REPLACEMENT OF ASCENDING THORACIC  ANEURYSM (N/A) Subjective:  He developed hypotension, oliguria and metabolic acidosis overnight with hyperkalemia to 6.7. His creat went from 1.2 yesterday am to 2.4 this am. AST 1232 and ALT 1574. Venous lactic acid 13.6. Central line and femoral arterial line inserted and intubated this am for anticipated progressive respiratory failure.  Objective: Vital signs in last 24 hours: Temp:  [96 F (35.6 C)-98.3 F (36.8 C)] 96.2 F (35.7 C) (03/06 0445) Pulse Rate:  [61-113] 89 (03/06 0500) Cardiac Rhythm:  [-] Atrial fibrillation;A-V Sequential paced (03/06 0000) Resp:  [11-26] 20 (03/06 0500) BP: (70-128)/(42-96) 83/42 mmHg (03/06 0500) SpO2:  [91 %-99 %] 92 % (03/06 0500) Weight:  [72 kg (158 lb 11.7 oz)] 72 kg (158 lb 11.7 oz) (03/06 0321)  Hemodynamic parameters for last 24 hours:    Intake/Output from previous day: 03/05 0701 - 03/06 0700 In: 700 [P.O.:700] Out: 475 [Urine:475] Intake/Output this shift: Total I/O In: 300 [P.O.:300] Out: 155 [Urine:155]  General appearance: alert, cooperative and looks ill Neurologic: intact Heart: regular rate and rhythm, S1, S2 normal, no murmur, click, rub or gallop Lungs: clear to auscultation bilaterally Abdomen: soft, nontender, not distended, no bowel sounds Extremities: edema moderate Wound: incision ok  Lab Results:  Recent Labs  01/10/16 0308 01/11/16 0138 01/11/16 0637  WBC 22.1* 22.8*  --   HGB 8.7* 8.1* 6.1*  HCT 26.9* 24.5* 18.0*  PLT 83* 79*  --    BMET:  Recent Labs  01/10/16 0308 01/11/16 0138 01/11/16 0637  NA 135 137 135  K 5.4* 6.7* 5.5*  CL 99* 96* 98*  CO2 28 22  --   GLUCOSE 170* 125* 117*  BUN 35* 54* 56*   CREATININE 1.24 2.14* 2.40*  CALCIUM 8.4* 8.5*  --     PT/INR:  Recent Labs  01/11/16 0138  LABPROT 22.6*  INR 2.00*   ABG    Component Value Date/Time   PHART 7.276* 01/11/2016 0642   HCO3 19.3* 01/11/2016 0642   TCO2 21 01/11/2016 0642   ACIDBASEDEF 7.0* 01/11/2016 0642   O2SAT 94.0 01/11/2016 0642   CBG (last 3)   Recent Labs  01/10/16 2036 01/10/16 2347 01/11/16 0426  GLUCAP 107* 104* 104*    Assessment/Plan: S/P Procedure(s) (LRB): CORONARY ARTERY BYPASS GRAFTING (CABG), ON PUMP, TIMES FOUR, USING LEFT INTERNAL MAMMARY ARTERY, BILATERAL GREATER SAPHENOUS VEINS HARVESTED ENDOSCOPICALLY (N/A) TRANSESOPHAGEAL ECHOCARDIOGRAM (TEE) (N/A) CIRC ARREST AND REPLACEMENT OF ASCENDING THORACIC  ANEURYSM (N/A)  He has had progressive decompensation overnight with acute renal failure, hypotension, metabolic acidosis, elevated transaminases suggesting shock liver. He was intubated this am due to severe COPD and anticipated progressive respiratory failure. The etiology of his hypotension is unclear. His CVP is 17 at this time. Will get an echo to rule out tamponade and check LV/RV function. Sepsis is a possibility. He has had a persistent elevation of WBC ct postop but did receive 125 mg Solumedrol for circulatory arrest. He has been hypothermic the past 12 hrs. I will ask CCM to see him to help with management of this critically ill gentleman.   LOS: 16 days    Alleen BorneBryan K Bartle 01/11/2016

## 2016-01-11 NOTE — Progress Notes (Signed)
Patient ID: Craig [name], male   DOB: 1951-10-16, 65 y.o.   MRN: [id number] EVENING ROUNDS NOTE :     [address]       [place] [address]             [phone]                 4 Days Post-Op Procedure(s) (LRB): CORONARY ARTERY BYPASS GRAFTING (CABG), ON PUMP, TIMES FOUR, USING LEFT INTERNAL MAMMARY ARTERY, BILATERAL GREATER SAPHENOUS VEINS HARVESTED ENDOSCOPICALLY (N/A) TRANSESOPHAGEAL ECHOCARDIOGRAM (TEE) (N/A) CIRC ARREST AND REPLACEMENT OF ASCENDING THORACIC  ANEURYSM (N/A)  Total Length of Stay:  LOS: 16 days  BP 156/87 mmHg  Pulse 111  Temp(Src) 96.8 F (36 C) (Core (Comment))  Resp 14  Ht  (1.753 m)  Wt 158 lb 11.7 oz (72 kg)  BMI 23.43 kg/m2  SpO2 99%  .Intake/Output      03/06 0701 - 03/07 0700   P.O.    I.V. (mL/kg) 853.6 (11.9)   Blood 670   NG/GT 90   IV Piggyback 800   Total Intake(mL/kg) 2413.6 (33.5)   Urine (mL/kg/hr) 420 (0.4)   Emesis/NG output 350 (0.3)   Stool    Total Output 770   Net +1643.6         . sodium chloride Stopped (01/08/16 1000)  . sodium chloride    . sodium chloride Stopped (01/07/16 1900)  . DOPamine 2 mcg/kg/min (01/11/16 1800)  . fentaNYL infusion INTRAVENOUS 200 mcg/hr (01/11/16 2000)  .  sodium bicarbonate  infusion 1000 mL 50 mL/hr at 01/11/16 1700     Lab Results  Component Value Date   WBC 21.9* 01/11/2016   HGB 6.2* 01/11/2016   HCT 19.0* 01/11/2016   PLT 56* 01/11/2016   GLUCOSE 183* 01/11/2016   CHOL 157 12/27/2015   TRIG 48 12/27/2015   HDL 30* 12/27/2015   LDLCALC 117* 12/27/2015   ALT 1574* 01/11/2016   AST 1232* 01/11/2016   NA 141 01/11/2016   K 5.6* 01/11/2016   CL 102 01/11/2016   CREATININE 2.35* 01/11/2016   BUN 58* 01/11/2016   CO2 25 01/11/2016   TSH 2.395 01/04/2016   INR 2.00* 01/11/2016   HGBA1C  05/28/2009    5.7 (NOTE) The ADA recommends the following therapeutic goal for glycemic control related to Hgb A1c measurement: Goal of therapy: <6.5 Hgb A1c   Reference: American Diabetes Association: Clinical Practice Recommendations 2010, Diabetes Care, 2010, 33: (Suppl  1).   ECHO done today : Study Conclusions  - Pericardium, extracardiac: A trivial pericardial effusion was identified. - Impressions: Difficult echo Poor acoustic windows. Contrast used. LV and RV systolic function appear grossly normal. There does not appear to be a significant pericaridial effusion. Limited exam.  Impressions:  - Difficult echo Poor acoustic windows. Contrast used. LV and RV systolic function appear grossly normal. There does not appear to be a significant pericaridial effusion. Limited exam.  Ct Chest Wo Contrast  01/11/2016  CLINICAL DATA:  65 year old male with history of sepsis. EXAM: CT CHEST, ABDOMEN AND PELVIS WITHOUT CONTRAST TECHNIQUE: Multidetector CT imaging of the chest, abdomen and pelvis was performed following the standard protocol without IV contrast. COMPARISON:  Chest CT 12/26/2015. FINDINGS: CT CHEST FINDINGS Mediastinum/Lymph Nodes: Compared to the prior examination there has been median sternotomy for aortic valve replacement and graft replacement of the ascending thoracic aorta. There is a stented bioprosthesis noted in the aortic valve position. Adjacent to the ascending  thoracic aortic graft there is a large amount of high attenuation in the surrounding mediastinum. Some of this may represent hemorrhage in the wrap of the native aorta around the graft, while much of this appears to be outside the wrap in the middle/anterior mediastinum. Heart size is normal. Small amount of pericardial fluid and/or thickening, which is intermediate to high attenuation (44 HU), likely to reflect a small amount of blood in the pericardial space. This is unlikely to be of hemodynamic significance at this time. There is atherosclerosis of the thoracic aorta, the great vessels of the mediastinum and the coronary arteries, including calcified  atherosclerotic plaque in the left main, left anterior descending, left circumflex and right coronary arteries. No pathologically enlarged mediastinal or hilar lymph nodes. Please note that accurate exclusion of hilar adenopathy is limited on noncontrast CT scans. Esophagus is unremarkable in appearance. Endotracheal tube in position approximately 6.5 cm above the carina. Nasogastric tube extends into the stomach. Epicardial pacing wires are noted. Lungs/Pleura: There are moderate bilateral pleural effusions layering dependently. On the right, fluid is low-attenuation and simple in appearance. On the left, there is some high attenuation debris, likely some hemorrhage. Diffuse bronchial wall thickening with moderate to severe centrilobular and mild to moderate paraseptal emphysema. Areas of passive atelectasis are noted in the lower lobes of the lungs bilaterally (right greater than left). No definite acute consolidative airspace disease. Musculoskeletal/Soft Tissues: Median sternotomy wires. Multiple old healed left-sided posterior lateral rib fractures are noted. There are no aggressive appearing lytic or blastic lesions noted in the visualized portions of the skeleton. CT ABDOMEN AND PELVIS FINDINGS Hepatobiliary: No definite cystic or solid hepatic lesions are identified on today's noncontrast CT examination. There is some high attenuation material lying dependently in the gallbladder, which may represent biliary sludge, vicarious excretion of contrast material from recently administered IV iodinated contrast, or could represent hemorrhage within the gallbladder (unlikely). Gallbladder wall is poorly delineated on today's noncontrast CT examination, but appears potentially thickened measuring up to 12 mm. Small amount of fluid adjacent to the liver appears to be part of a a general volume of ascites. Pancreas: No definite pancreatic mass or peripancreatic inflammatory changes. Spleen: Unremarkable.  Adrenals/Urinary Tract: Several tiny nonobstructive calculi in the collecting systems of the kidneys bilaterally measuring 2-3 mm. Unenhanced appearance of the kidneys is otherwise unremarkable. Bilateral adrenal glands are normal in appearance. No hydroureteronephrosis. Urinary bladder is completely decompressed with a Foley balloon catheter in place. Stomach/Bowel: The appearance of the stomach is normal. There is no pathologic dilatation of small bowel or colon. Several colonic diverticulae are noted in the region of the sigmoid colon. No overt surrounding inflammatory changes are identified at this time to strongly suggest an acute diverticulitis (although assessment is limited by small volume of ascites). There is a portion of the sigmoid colon which appears thickened, best appreciated on axial image 99 of series 2 and coronal image 24 of series 4; although this portion of the colon appears relatively decompressed and this may represent pseudothickening, the possibility of a colonic lesion is not excluded. Normal appendix. Vascular/Lymphatic: Extensive atherosclerosis throughout the abdominal and pelvic vasculature, including fusiform aneurysmal dilatation of the infrarenal abdominal aorta which measures up to 3.5 cm in diameter. No definite lymphadenopathy noted in the abdomen or pelvis on today's noncontrast CT examination. Reproductive: Prostate gland and seminal vesicles are unremarkable in appearance. Other: Small volume of ascites. Mild diffuse mesenteric edema. Trace amount of retroperitoneal fluid. Diffuse body wall edema. No pneumoperitoneum.  Musculoskeletal: There are no aggressive appearing lytic or blastic lesions noted in the visualized portions of the skeleton. IMPRESSION: 1. Small amount of hemorrhage in the middle and anterior mediastinum adjacent to the ascending aortic graft. Some of this appears to extend into the pericardial space (this is small volume at this time, unlikely to be of  hemodynamic significance in the pericardial space). 2. Moderate bilateral pleural fluid collections. On the right, this appears to be a simple pleural effusion, and on the left there appears to be some dependent hemorrhagic debris (not a frank hemothorax). 3. Extensive passive atelectasis in the lower lobes of the lungs bilaterally (right greater than left). 4. Small volume of ascites, mild diffuse mesenteric edema and body wall edema suggesting a state of anasarca. 5. High attenuation in the gallbladder. If there has been recent administration of iodinated contrast material, this is presumably vicarious excretion. Alternatively, this could reflect biliary sludge. Less likely, hemorrhage into the lumen of the gallbladder could be considered. There also appears to be some gallbladder wall edema, which is commonly seen in the setting of ascites and anasarca. If there is any clinical concern for acute gallbladder pathology, further evaluation with right upper quadrant ultrasound could be considered. 6. Atherosclerosis, including left main and 3 vessel coronary artery disease. In addition, there is a 3.5 cm infrarenal abdominal aortic aneurysm. Recommend followup by ultrasound in 2 years. This recommendation follows ACR consensus guidelines: White Paper of the ACR Incidental Findings Committee II on Vascular Findings. J Am Coll Radiol 2013; 10:789-794. 7. Diffuse bronchial wall thickening with moderate to severe centrilobular and mild to moderate paraseptal emphysema; imaging findings suggestive of underlying COPD. 8. Short segment of mural thickening in the sigmoid colon concerning for potential colonic neoplasm. Further evaluation with nonemergent colonoscopy after resolution of the patient's acute illness is recommended. 9. Additional incidental findings, as above. These results will be called to the ordering clinician or representative by the Radiologist Assistant, and communication documented in the PACS or zVision  Dashboard. Electronically Signed   By: Trudie Reedaniel  Entrikin M.D.   On: 01/11/2016 19:42   [place] Abdomen Complete  01/11/2016  CLINICAL DATA:  Acute renal failure EXAM: ABDOMEN ULTRASOUND COMPLETE COMPARISON:  None. FINDINGS: Gallbladder: Gallbladder wall appears thickened and edematous measuring up to 9 mm in thickness. No gallstones are evident. Low level echoes are identified within the gallbladder lumen. There is some associated pericholecystic fluid. The sonographer could not report on sonographic Murphy sign given the patient is intubated and ventilator dependent. Common bile duct: Diameter: 3 mm Liver: Coarsening of the echotexture suggests a degree of underlying fatty change. Liver contour appears nodular on some images an underlying cirrhosis cannot be excluded. IVC: No abnormality visualized. Pancreas: Obscured by bowel gas. Spleen: Size and appearance within normal limits. Right Kidney: Length: 10.8 cm. Echogenicity within normal limits. No mass or hydronephrosis visualized. Left Kidney: Length: 11.5 cm. Not well seen, but no evidence for hydronephrosis. Abdominal aorta: Distal aorta obscured by bowel gas. Other findings: Bilateral pleural effusions are evident. There is intra-abdominal ascites with small degree. IMPRESSION: 1. Gallbladder wall thickening without gallstones evident. This could be related to systemic disease in a patient with liver failure. Acalculus cholecystitis could also have this imaging appearance. 2. Bilateral pleural effusions. 3. No evidence for hydronephrosis. Electronically Signed   By: Kennith CenterEric  Mansell M.D.   On: 01/11/2016 14:41       Remains on vent , follows commands, bp elevated to 140-150 Discussed CT with CCm, no  re son to re explore mediastinum currently .   Delight Ovens MD  Beeper [phone] Office [phone] 01/11/2016 9:15 PM

## 2016-01-11 NOTE — Progress Notes (Signed)
CRITICAL VALUE ALERT  Critical value received:  Lactic Acid 2.9  Date of notification:  01/11/16   Time of notification:  1640  Critical value read back:Yes.    Nurse who received alert:  Lynetta MareHeather Betti Goodenow, RN  MD notified (1st page):  Tyson AliasFeinstein Time of first page:  1640  MD notified (2nd page):  Time of second page:  Responding MD:  Tyson AliasFeinstein  Time MD responded:  [phone]

## 2016-01-11 NOTE — Progress Notes (Signed)
CRITICAL VALUE ALERT  Critical value received:  K 6.7  Date of notification:  01/11/2016  Time of notification:  0320  Critical value read back:Yes.    Nurse who received alert:  Modena JanskyKevin Vong Garringer RN  MD notified (1st page):  Bartle  Time of first page:  0325  MD notified (2nd page): Bartle  Time of second page: 0332  Responding MD:  Laneta SimmersBartle  Time MD responded:  [phone]

## 2016-01-11 NOTE — Progress Notes (Signed)
UR Completed. Nevaen Tredway, RN, BSN.  [phone] 

## 2016-01-11 NOTE — Progress Notes (Signed)
RT note-Rate increased to 18 per Dr. Tyson AliasFeinstein.

## 2016-01-11 NOTE — Progress Notes (Addendum)
Pharmacy Antibiotic Note  Craig [name] is a 65 y.o. male admitted on 12/26/2015 with pneumonia. Pt intubated this AM for anticipated progressive respiratory failure. Pharmacy has been consulted for vanc/imipenem dosing.  No history of seizures noted. Afeb, wbc 22.7. SCr bump this AM, 1.24>>2.36, CrCl~31.  Plan: Vanc 1500mg  x1; then 1g IV every 24 hours.  Goal trough 15-20 Imipenem 250mg  IV q6h Monitor clinical progress, c/s, renal function, abx plan/LOT VT@SS  as indicated   Height: 5\' 9"  (175.3 cm) Weight: 158 lb 11.7 oz (72 kg) IBW/kg (Calculated) : 70.7  Temp (24hrs), Avg:97.1 F (36.2 C), Min:96 F (35.6 C), Max:98.3 F (36.8 C)   Recent Labs Lab 01/08/16 1635 01/09/16 0444 01/10/16 0308 01/11/16 0138 01/11/16 0450 01/11/16 0637 01/11/16 0638  WBC 20.8* 21.7* 22.1* 22.8*  --   --  22.7*  CREATININE 1.14 1.15 1.24 2.14*  --  2.40* 2.36*  LATICACIDVEN  --   --   --   --  13.6*  --   --     Estimated Creatinine Clearance: 31.6 mL/min (by C-G formula based on Cr of 2.36).    No Known Allergies  Antimicrobials this admission: Imipenem 3/6 >> Vanc 3/6 >> Ceftriaxone 2/18 >> 2/24 Azith 2/18 >> 2/22 Tamiflu 2/18>>2/21  Dose adjustments this admission: n/a  Microbiology results: 3/6 BCx2:  3/2 surgical mrsa pcr: neg 2/19 flu: neg 2/19 resp cx: NF 2/19 mrsa: neg 2/19 strep pneumo: neg  Craig [name], PharmD, BCPS Clinical Pharmacist Pager [phone] 01/11/2016 8:25 AM

## 2016-01-11 NOTE — Progress Notes (Signed)
CRITICAL VALUE ALERT  Critical value received:  Troponin 7.41  Date of notification:  01/11/16  Time of notification:  1255  Critical value read back:Yes.    Nurse who received alert:  Lynetta MareHeather Aleli Navedo, RN  MD notified (1st page):  Dr. Tyson AliasFeinstein  Time of first page:  1256  MD notified (2nd page):  Time of second page:  Responding MD:  Dr. Tyson AliasFeinstein  Time MD responded:  1257  Discussed other labs, L[facility] lab concerning, patient will need an Abdomen/Pelvis with oral contrast, Dr. Tyson AliasFeinstein to place orders.  Hermina BartersBOWMAN, Abdulmalik Darco M, RN

## 2016-01-11 NOTE — Progress Notes (Signed)
CRITICAL VALUE ALERT  Critical value received:  K 6.2  Date of notification:  01/11/16  Time of notification:  0730  Critical value read back:Yes.    Nurse who received alert:  Lynetta MareHeather Brentyn Seehafer, RN  MD notified (1st page):  Bartle  Time of first page:  [phone]  MD notified (2nd page):  Time of second page:  Responding MD:  Laneta SimmersBartle  Time MD responded:  [phone]  Verbal order to give 30 of Kayexalate

## 2016-01-11 NOTE — Transfer of Care (Signed)
Immediate Anesthesia Transfer of Care Note  Patient: Craig [name]  Procedure(s) Performed: * No procedures listed *  Patient Location: PACU  Anesthesia Type:General  Level of Consciousness: sedated  Airway & Oxygen Therapy: Patient remains intubated per anesthesia plan and Patient placed on Ventilator (see vital sign flow sheet for setting)  Post-op Assessment: Report given to RN and Post -op Vital signs reviewed and stable  Post vital signs: Reviewed and stable  Last Vitals:  Filed Vitals:   01/11/16 0445 01/11/16 0500  BP:  83/42  Pulse: 89 89  Temp: 35.7 C   Resp: 21 20    Complications: No apparent anesthesia complications

## 2016-01-11 NOTE — Progress Notes (Signed)
Dr. Tyson AliasFeinstein at bedside, verbal order to get another lactic acid now and one in the am depending on trend, wean dopamine to off, vent rate can be turned down from 18 to 14.  Hermina BartersBOWMAN, Genavieve Mangiapane M, RN

## 2016-01-11 NOTE — Progress Notes (Signed)
  Echocardiogram 2D Echocardiogram Limited with Definity has been performed.  Leta JunglingCooper, Roseanne Juenger M 01/11/2016, 8:17 AM

## 2016-01-11 NOTE — Anesthesia Procedure Notes (Signed)
Procedure Name: Intubation Date/Time: 01/11/2016 6:43 AM Performed by: Brien MatesMAHONY, Dinari Stgermaine D Pre-anesthesia Checklist: Patient identified, Emergency Drugs available, Suction available, Patient being monitored and Timeout performed Patient Re-evaluated:Patient Re-evaluated prior to inductionOxygen Delivery Method: Circle system utilized Preoxygenation: Pre-oxygenation with 100% oxygen Intubation Type: IV induction Ventilation: Mask ventilation without difficulty Laryngoscope Size: Glidescope Grade View: Grade I Tube type: Subglottic suction tube Tube size: 7.5 mm Number of attempts: 1 Airway Equipment and Method: Video-laryngoscopy Placement Confirmation: ETT inserted through vocal cords under direct vision,  positive ETCO2 and CO2 detector Secured at: 22 cm Tube secured with: Tape Dental Injury: Teeth and Oropharynx as per pre-operative assessment

## 2016-01-11 NOTE — Progress Notes (Signed)
Dr. Laneta SimmersBartle paged and spoke with regarding multiple abnormal labs and low urine output. Orders received. Will continue to monitor closely. Modena JanskyKevin Marisabel Macpherson RN [address] [place]

## 2016-01-11 NOTE — Progress Notes (Signed)
Dr. Laneta SimmersBartle at bedside, verbal order to keep dopamine at 2 mcgs for renal perfusion.  Hermina BartersBOWMAN, Jomel Whittlesey M, RN

## 2016-01-11 NOTE — Progress Notes (Signed)
eLink Physician-Brief Progress Note Patient Name: Craig FeinsteinRobert S Waren DOB: 10/30/1951 MRN: [id number]   Date of Service  01/11/2016  HPI/Events of Note  I spoke with Dr. 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 Reviewed CT imaging without obvious signs of tamponade on CT. No other obvious source of sepsis. Questionable malignancy in the colon.  eICU Interventions  Continue current plan of care. Dr. Tyrone SageGerhardt to notify Dr. Laneta SimmersBartle for review of CT.     Intervention Category Intermediate Interventions: Diagnostic test evaluation;Communication with other healthcare providers and/or family  Lawanda CousinsJennings Merit Gadsby 01/11/2016, 8:17 PM

## 2016-01-11 NOTE — Progress Notes (Signed)
CRITICAL VALUE ALERT  Critical value received:  hgb 6.2  Date of notification:  01/11/2016  Time of notification:  0710  Critical value read back:Yes.    Nurse who received alert:  Modena JanskyKevin Cutler Sunday RN  MD notified (1st page):  Bartle  Time of first page:  0710  MD notified (2nd page):  Time of second page:  Responding MD:  Laneta SimmersBartle  Time MD responded:  [phone]

## 2016-01-11 NOTE — Progress Notes (Signed)
Dr. Laneta SimmersBartle paged for increased work of breathing, increased 02 needs, low temp, and hypotension. Orders received. Will continue to closely monitor. Modena JanskyKevin Dodd Schmid RN

## 2016-01-12 ENCOUNTER — Inpatient Hospital Stay (HOSPITAL_COMMUNITY): Payer: Medicaid Other

## 2016-01-12 DIAGNOSIS — E872 Acidosis: Secondary | ICD-10-CM | POA: Insufficient documentation

## 2016-01-12 DIAGNOSIS — J9602 Acute respiratory failure with hypercapnia: Secondary | ICD-10-CM | POA: Insufficient documentation

## 2016-01-12 DIAGNOSIS — J9600 Acute respiratory failure, unspecified whether with hypoxia or hypercapnia: Secondary | ICD-10-CM

## 2016-01-12 LAB — GLUCOSE, CAPILLARY
GLUCOSE-CAPILLARY: 104 mg/dL — AB (ref 65–99)
GLUCOSE-CAPILLARY: 100 mg/dL — AB (ref 65–99)
GLUCOSE-CAPILLARY: 112 mg/dL — AB (ref 65–99)
GLUCOSE-CAPILLARY: 94 mg/dL (ref 65–99)
Glucose-Capillary: 113 mg/dL — ABNORMAL HIGH (ref 65–99)
Glucose-Capillary: 137 mg/dL — ABNORMAL HIGH (ref 65–99)

## 2016-01-12 LAB — COMPREHENSIVE METABOLIC PANEL
ALBUMIN: 2.8 g/dL — AB (ref 3.5–5.0)
ALT: 6788 U/L — ABNORMAL HIGH (ref 17–63)
AST: 9483 U/L — AB (ref 15–41)
Alkaline Phosphatase: 66 U/L (ref 38–126)
Anion gap: 11 (ref 5–15)
BUN: 68 mg/dL — AB (ref 6–20)
CHLORIDE: 100 mmol/L — AB (ref 101–111)
CO2: 30 mmol/L (ref 22–32)
Calcium: 7.5 mg/dL — ABNORMAL LOW (ref 8.9–10.3)
Creatinine, Ser: 2.39 mg/dL — ABNORMAL HIGH (ref 0.61–1.24)
GFR calc Af Amer: 31 mL/min — ABNORMAL LOW (ref >60)
GFR calc non Af Amer: 27 mL/min — ABNORMAL LOW (ref >60)
GLUCOSE: 126 mg/dL — AB (ref 65–99)
POTASSIUM: 4.7 mmol/L (ref 3.5–5.1)
SODIUM: 141 mmol/L (ref 135–145)
Total Bilirubin: 1.6 mg/dL — ABNORMAL HIGH (ref 0.3–1.2)
Total Protein: 4.1 g/dL — ABNORMAL LOW (ref 6.5–8.1)

## 2016-01-12 LAB — BLOOD GAS, ARTERIAL
ACID-BASE EXCESS: 7.3 mmol/L — AB (ref 0.0–2.0)
Bicarbonate: 31.4 mEq/L — ABNORMAL HIGH (ref 20.0–24.0)
FIO2: 0.4
LHR: 14 {breaths}/min
O2 Saturation: 96.7 %
PATIENT TEMPERATURE: 98.6
PCO2 ART: 44.8 mmHg (ref 35.0–45.0)
PEEP: 5 cmH2O
PH ART: 7.459 — AB (ref 7.350–7.450)
TCO2: 32.7 mmol/L (ref 0–100)
VT: 570 mL
pO2, Arterial: 93.2 mmHg (ref 80.0–100.0)

## 2016-01-12 LAB — CBC WITH DIFFERENTIAL/PLATELET
Basophils Absolute: 0 10*3/uL (ref 0.0–0.1)
Basophils Relative: 0 %
EOS PCT: 0 %
Eosinophils Absolute: 0 10*3/uL (ref 0.0–0.7)
HEMATOCRIT: 27.3 % — AB (ref 39.0–52.0)
Hemoglobin: 9.2 g/dL — ABNORMAL LOW (ref 13.0–17.0)
LYMPHS ABS: 1.6 10*3/uL (ref 0.7–4.0)
LYMPHS PCT: 10 %
MCH: 29.9 pg (ref 26.0–34.0)
MCHC: 33.7 g/dL (ref 30.0–36.0)
MCV: 88.6 fL (ref 78.0–100.0)
MONO ABS: 0.9 10*3/uL (ref 0.1–1.0)
Monocytes Relative: 5 %
Neutro Abs: 14.2 10*3/uL — ABNORMAL HIGH (ref 1.7–7.7)
Neutrophils Relative %: 85 %
Platelets: 63 10*3/uL — ABNORMAL LOW (ref 150–400)
RBC: 3.08 MIL/uL — ABNORMAL LOW (ref 4.22–5.81)
RDW: 16.2 % — AB (ref 11.5–15.5)
WBC: 16.7 10*3/uL — ABNORMAL HIGH (ref 4.0–10.5)

## 2016-01-12 LAB — POCT I-STAT 3, ART BLOOD GAS (G3+)
Acid-Base Excess: 7 mmol/L — ABNORMAL HIGH (ref 0.0–2.0)
BICARBONATE: 33.4 meq/L — AB (ref 20.0–24.0)
O2 SAT: 86 %
PCO2 ART: 58.1 mmHg — AB (ref 35.0–45.0)
PH ART: 7.366 (ref 7.350–7.450)
PO2 ART: 54 mmHg — AB (ref 80.0–100.0)
Patient temperature: 36.8
TCO2: 35 mmol/L (ref 0–100)

## 2016-01-12 LAB — TYPE AND SCREEN
ABO/RH(D): A POS
ANTIBODY SCREEN: NEGATIVE
UNIT DIVISION: 0
Unit division: 0

## 2016-01-12 LAB — LACTIC ACID, PLASMA: Lactic Acid, Venous: 1.4 mmol/L (ref 0.5–2.0)

## 2016-01-12 LAB — PROTIME-INR
INR: 3.41 — AB (ref 0.00–1.49)
PROTHROMBIN TIME: 33.7 s — AB (ref 11.6–15.2)

## 2016-01-12 LAB — FIBRINOGEN: FIBRINOGEN: 292 mg/dL (ref 204–475)

## 2016-01-12 LAB — APTT: APTT: 36 s (ref 24–37)

## 2016-01-12 LAB — PROCALCITONIN: Procalcitonin: 1.71 ng/mL

## 2016-01-12 MED ORDER — SODIUM CHLORIDE 0.9 % IV SOLN
Freq: Once | INTRAVENOUS | Status: AC
  Administered 2016-01-12: 07:00:00 via INTRAVENOUS

## 2016-01-12 MED ORDER — VITAMIN K1 10 MG/ML IJ SOLN
2.0000 mg | Freq: Once | INTRAVENOUS | Status: AC
  Administered 2016-01-12: 2 mg via INTRAVENOUS
  Filled 2016-01-12: qty 0.2

## 2016-01-12 MED ORDER — NITROGLYCERIN IN D5W 200-5 MCG/ML-% IV SOLN
2.0000 ug/min | INTRAVENOUS | Status: DC
  Administered 2016-01-12: 60 ug/min via INTRAVENOUS
  Filled 2016-01-12: qty 250

## 2016-01-12 MED ORDER — METOPROLOL TARTRATE 1 MG/ML IV SOLN
INTRAVENOUS | Status: AC
  Filled 2016-01-12: qty 5

## 2016-01-12 MED ORDER — HYDRALAZINE HCL 20 MG/ML IJ SOLN
10.0000 mg | INTRAMUSCULAR | Status: DC | PRN
  Administered 2016-01-12 – 2016-01-16 (×3): 10 mg via INTRAVENOUS
  Filled 2016-01-12 (×3): qty 1

## 2016-01-12 MED ORDER — CETYLPYRIDINIUM CHLORIDE 0.05 % MT LIQD
7.0000 mL | Freq: Two times a day (BID) | OROMUCOSAL | Status: DC
  Administered 2016-01-12 – 2016-02-03 (×33): 7 mL via OROMUCOSAL

## 2016-01-12 MED ORDER — METOPROLOL TARTRATE 1 MG/ML IV SOLN
5.0000 mg | Freq: Four times a day (QID) | INTRAVENOUS | Status: DC
  Administered 2016-01-12 – 2016-01-13 (×5): 5 mg via INTRAVENOUS
  Filled 2016-01-12 (×5): qty 5

## 2016-01-12 MED ORDER — NITROGLYCERIN IN D5W 200-5 MCG/ML-% IV SOLN
INTRAVENOUS | Status: AC
  Filled 2016-01-12: qty 250

## 2016-01-12 NOTE — Progress Notes (Signed)
RT note-Attempted wean, patient very calm, however, B/P elevated, MD called, placed back to full support for now.

## 2016-01-12 NOTE — Progress Notes (Signed)
eLink Physician-Brief Progress Note Patient Name: Craig FeinsteinRobert S [name] DOB: 06/01/1951 MRN: [id number]   Date of Service  01/12/2016  HPI/Events of Note  Camara check on patient post extubation at 2:26 PM. Postextubation ABG shows well compensated  Chronic hypercarbic respiratory failure. Patient's saturation on 6 L/m via nasal cannula 95%. Appears comfortable with no signs of distress and denies any pain at this time. Nurse at bedside and reports surgery okay with ice chips at this time.  eICU Interventions  1. Continuing incentive spirometry to minimize atelectasis 2. Oxygen saturation optimal to prevent worsening of VQ mismatching and hypercarbia 3. Continue close monitoring in the intensive care for risk of reintubation     Intervention Category Major Interventions: Respiratory failure - evaluation and management  Lawanda CousinsJennings Nestor 01/12/2016, 4:51 PM

## 2016-01-12 NOTE — Progress Notes (Signed)
PCCM PROGRESS NOTE  ADMISSION DATE: 12/26/2015 CONSULT DATE: 12/27/2015 REFERRING PROVIDER: [facility]  CC: Short of breath  SUBJECTIVE: Shock, new acute resp failure  VITAL SIGNS: BP 113/74 mmHg  Pulse 89  Temp(Src) 98.2 F (36.8 C) (Core (Comment))  Resp 14  Ht '5\' 9"'  (1.753 m)  Wt 73 kg (160 lb 15 oz)  BMI 23.76 kg/m2  SpO2 98%  INTAKE/OUTPUT: I/O last 3 completed shifts: In: 3675.2 [P.O.:300; I.V.:1555.2; Blood:670; NG/GT:150; IV Piggyback:1000] Out: 1295 [Urine:945; Emesis/NG output:350]  General: alert on vent, calm HEENT: jvd, rass 0 Cardiac: in tpaced, r regular, 2/6 SM, wound dressing clean dry Chest: coarse anterior Abd: soft, non tender, some brusing, no r/g, BS wnl, no distention Ext: 1+ edema about the same Neuro: normal strength Skin: no rashes   CBC Recent Labs     01/11/16  0638  01/11/16  0920  01/12/16  0315  WBC  22.7*  21.9*  16.7*  HGB  6.2*  6.2*  9.2*  HCT  18.9*  19.0*  27.3*  PLT  64*  56*  63*    Coag's Recent Labs     01/10/16  0308  01/11/16  0138  01/12/16  0315  APTT   --    --   36  INR  1.10  2.00*  3.41*    BMET Recent Labs     01/11/16  1130  01/11/16  2227  01/12/16  0315  NA  141  140  141  K  5.6*  4.5  4.7  CL  102  102  100*  CO2  '25  29  30  ' BUN  58*  69*  68*  CREATININE  2.35*  2.23*  2.39*  GLUCOSE  183*  134*  126*    Electrolytes Recent Labs     01/11/16  1130  01/11/16  2227  01/12/16  0315  CALCIUM  7.4*  7.3*  7.5*    ABG Recent Labs     01/11/16  0728  01/11/16  1354  01/12/16  0430  PHART  7.334*  7.450  7.459*  PCO2ART  41.7  43.7  44.8  PO2ART  151.0*  97.0  93.2    Glucose Recent Labs     01/11/16  0813  01/11/16  1109  01/11/16  1533  01/11/16  1919  01/12/16  0054  01/12/16  0344  GLUCAP  124*  152*  159*  113*  113*  104*    Imaging Ct Abdomen Pelvis Wo Contrast  01/11/2016  CLINICAL DATA:  65 year old male with history of sepsis. EXAM: CT CHEST, ABDOMEN AND  PELVIS WITHOUT CONTRAST TECHNIQUE: Multidetector CT imaging of the chest, abdomen and pelvis was performed following the standard protocol without IV contrast. COMPARISON:  Chest CT 12/26/2015. FINDINGS: CT CHEST FINDINGS Mediastinum/Lymph Nodes: Compared to the prior examination there has been median sternotomy for aortic valve replacement and graft replacement of the ascending thoracic aorta. There is a stented bioprosthesis noted in the aortic valve position. Adjacent to the ascending thoracic aortic graft there is a large amount of high attenuation in the surrounding mediastinum. Some of this may represent hemorrhage in the wrap of the native aorta around the graft, while much of this appears to be outside the wrap in the middle/anterior mediastinum. Heart size is normal. Small amount of pericardial fluid and/or thickening, which is intermediate to high attenuation (44 HU), likely to reflect a small amount of blood in the pericardial space. This is  unlikely to be of hemodynamic significance at this time. There is atherosclerosis of the thoracic aorta, the great vessels of the mediastinum and the coronary arteries, including calcified atherosclerotic plaque in the left main, left anterior descending, left circumflex and right coronary arteries. No pathologically enlarged mediastinal or hilar lymph nodes. Please note that accurate exclusion of hilar adenopathy is limited on noncontrast CT scans. Esophagus is unremarkable in appearance. Endotracheal tube in position approximately 6.5 cm above the carina. Nasogastric tube extends into the stomach. Epicardial pacing wires are noted. Lungs/Pleura: There are moderate bilateral pleural effusions layering dependently. On the right, fluid is low-attenuation and simple in appearance. On the left, there is some high attenuation debris, likely some hemorrhage. Diffuse bronchial wall thickening with moderate to severe centrilobular and mild to moderate paraseptal emphysema.  Areas of passive atelectasis are noted in the lower lobes of the lungs bilaterally (right greater than left). No definite acute consolidative airspace disease. Musculoskeletal/Soft Tissues: Median sternotomy wires. Multiple old healed left-sided posterior lateral rib fractures are noted. There are no aggressive appearing lytic or blastic lesions noted in the visualized portions of the skeleton. CT ABDOMEN AND PELVIS FINDINGS Hepatobiliary: No definite cystic or solid hepatic lesions are identified on today's noncontrast CT examination. There is some high attenuation material lying dependently in the gallbladder, which may represent biliary sludge, vicarious excretion of contrast material from recently administered IV iodinated contrast, or could represent hemorrhage within the gallbladder (unlikely). Gallbladder wall is poorly delineated on today's noncontrast CT examination, but appears potentially thickened measuring up to 12 mm. Small amount of fluid adjacent to the liver appears to be part of a a general volume of ascites. Pancreas: No definite pancreatic mass or peripancreatic inflammatory changes. Spleen: Unremarkable. Adrenals/Urinary Tract: Several tiny nonobstructive calculi in the collecting systems of the kidneys bilaterally measuring 2-3 mm. Unenhanced appearance of the kidneys is otherwise unremarkable. Bilateral adrenal glands are normal in appearance. No hydroureteronephrosis. Urinary bladder is completely decompressed with a Foley balloon catheter in place. Stomach/Bowel: The appearance of the stomach is normal. There is no pathologic dilatation of small bowel or colon. Several colonic diverticulae are noted in the region of the sigmoid colon. No overt surrounding inflammatory changes are identified at this time to strongly suggest an acute diverticulitis (although assessment is limited by small volume of ascites). There is a portion of the sigmoid colon which appears thickened, best appreciated on  axial image 99 of series 2 and coronal image 24 of series 4; although this portion of the colon appears relatively decompressed and this may represent pseudothickening, the possibility of a colonic lesion is not excluded. Normal appendix. Vascular/Lymphatic: Extensive atherosclerosis throughout the abdominal and pelvic vasculature, including fusiform aneurysmal dilatation of the infrarenal abdominal aorta which measures up to 3.5 cm in diameter. No definite lymphadenopathy noted in the abdomen or pelvis on today's noncontrast CT examination. Reproductive: Prostate gland and seminal vesicles are unremarkable in appearance. Other: Small volume of ascites. Mild diffuse mesenteric edema. Trace amount of retroperitoneal fluid. Diffuse body wall edema. No pneumoperitoneum. Musculoskeletal: There are no aggressive appearing lytic or blastic lesions noted in the visualized portions of the skeleton. IMPRESSION: 1. Small amount of hemorrhage in the middle and anterior mediastinum adjacent to the ascending aortic graft. Some of this appears to extend into the pericardial space (this is small volume at this time, unlikely to be of hemodynamic significance in the pericardial space). 2. Moderate bilateral pleural fluid collections. On the right, this appears to be a simple  pleural effusion, and on the left there appears to be some dependent hemorrhagic debris (not a frank hemothorax). 3. Extensive passive atelectasis in the lower lobes of the lungs bilaterally (right greater than left). 4. Small volume of ascites, mild diffuse mesenteric edema and body wall edema suggesting a state of anasarca. 5. High attenuation in the gallbladder. If there has been recent administration of iodinated contrast material, this is presumably vicarious excretion. Alternatively, this could reflect biliary sludge. Less likely, hemorrhage into the lumen of the gallbladder could be considered. There also appears to be some gallbladder wall edema, which  is commonly seen in the setting of ascites and anasarca. If there is any clinical concern for acute gallbladder pathology, further evaluation with right upper quadrant ultrasound could be considered. 6. Atherosclerosis, including left main and 3 vessel coronary artery disease. In addition, there is a 3.5 cm infrarenal abdominal aortic aneurysm. Recommend followup by ultrasound in 2 years. This recommendation follows ACR consensus guidelines: White Paper of the ACR Incidental Findings Committee II on Vascular Findings. J Am Coll Radiol 2013; 10:789-794. 7. Diffuse bronchial wall thickening with moderate to severe centrilobular and mild to moderate paraseptal emphysema; imaging findings suggestive of underlying COPD. 8. Short segment of mural thickening in the sigmoid colon concerning for potential colonic neoplasm. Further evaluation with nonemergent colonoscopy after resolution of the patient's acute illness is recommended. 9. Additional incidental findings, as above. These results will be called to the ordering clinician or representative by the Radiologist Assistant, and communication documented in the PACS or zVision Dashboard. Electronically Signed   By: Vinnie Langton M.D.   On: 01/11/2016 19:42   Ct Chest Wo Contrast  01/11/2016  CLINICAL DATA:  65 year old male with history of sepsis. EXAM: CT CHEST, ABDOMEN AND PELVIS WITHOUT CONTRAST TECHNIQUE: Multidetector CT imaging of the chest, abdomen and pelvis was performed following the standard protocol without IV contrast. COMPARISON:  Chest CT 12/26/2015. FINDINGS: CT CHEST FINDINGS Mediastinum/Lymph Nodes: Compared to the prior examination there has been median sternotomy for aortic valve replacement and graft replacement of the ascending thoracic aorta. There is a stented bioprosthesis noted in the aortic valve position. Adjacent to the ascending thoracic aortic graft there is a large amount of high attenuation in the surrounding mediastinum. Some of this  may represent hemorrhage in the wrap of the native aorta around the graft, while much of this appears to be outside the wrap in the middle/anterior mediastinum. Heart size is normal. Small amount of pericardial fluid and/or thickening, which is intermediate to high attenuation (44 HU), likely to reflect a small amount of blood in the pericardial space. This is unlikely to be of hemodynamic significance at this time. There is atherosclerosis of the thoracic aorta, the great vessels of the mediastinum and the coronary arteries, including calcified atherosclerotic plaque in the left main, left anterior descending, left circumflex and right coronary arteries. No pathologically enlarged mediastinal or hilar lymph nodes. Please note that accurate exclusion of hilar adenopathy is limited on noncontrast CT scans. Esophagus is unremarkable in appearance. Endotracheal tube in position approximately 6.5 cm above the carina. Nasogastric tube extends into the stomach. Epicardial pacing wires are noted. Lungs/Pleura: There are moderate bilateral pleural effusions layering dependently. On the right, fluid is low-attenuation and simple in appearance. On the left, there is some high attenuation debris, likely some hemorrhage. Diffuse bronchial wall thickening with moderate to severe centrilobular and mild to moderate paraseptal emphysema. Areas of passive atelectasis are noted in the lower lobes of  the lungs bilaterally (right greater than left). No definite acute consolidative airspace disease. Musculoskeletal/Soft Tissues: Median sternotomy wires. Multiple old healed left-sided posterior lateral rib fractures are noted. There are no aggressive appearing lytic or blastic lesions noted in the visualized portions of the skeleton. CT ABDOMEN AND PELVIS FINDINGS Hepatobiliary: No definite cystic or solid hepatic lesions are identified on today's noncontrast CT examination. There is some high attenuation material lying dependently in  the gallbladder, which may represent biliary sludge, vicarious excretion of contrast material from recently administered IV iodinated contrast, or could represent hemorrhage within the gallbladder (unlikely). Gallbladder wall is poorly delineated on today's noncontrast CT examination, but appears potentially thickened measuring up to 12 mm. Small amount of fluid adjacent to the liver appears to be part of a a general volume of ascites. Pancreas: No definite pancreatic mass or peripancreatic inflammatory changes. Spleen: Unremarkable. Adrenals/Urinary Tract: Several tiny nonobstructive calculi in the collecting systems of the kidneys bilaterally measuring 2-3 mm. Unenhanced appearance of the kidneys is otherwise unremarkable. Bilateral adrenal glands are normal in appearance. No hydroureteronephrosis. Urinary bladder is completely decompressed with a Foley balloon catheter in place. Stomach/Bowel: The appearance of the stomach is normal. There is no pathologic dilatation of small bowel or colon. Several colonic diverticulae are noted in the region of the sigmoid colon. No overt surrounding inflammatory changes are identified at this time to strongly suggest an acute diverticulitis (although assessment is limited by small volume of ascites). There is a portion of the sigmoid colon which appears thickened, best appreciated on axial image 99 of series 2 and coronal image 24 of series 4; although this portion of the colon appears relatively decompressed and this may represent pseudothickening, the possibility of a colonic lesion is not excluded. Normal appendix. Vascular/Lymphatic: Extensive atherosclerosis throughout the abdominal and pelvic vasculature, including fusiform aneurysmal dilatation of the infrarenal abdominal aorta which measures up to 3.5 cm in diameter. No definite lymphadenopathy noted in the abdomen or pelvis on today's noncontrast CT examination. Reproductive: Prostate gland and seminal vesicles are  unremarkable in appearance. Other: Small volume of ascites. Mild diffuse mesenteric edema. Trace amount of retroperitoneal fluid. Diffuse body wall edema. No pneumoperitoneum. Musculoskeletal: There are no aggressive appearing lytic or blastic lesions noted in the visualized portions of the skeleton. IMPRESSION: 1. Small amount of hemorrhage in the middle and anterior mediastinum adjacent to the ascending aortic graft. Some of this appears to extend into the pericardial space (this is small volume at this time, unlikely to be of hemodynamic significance in the pericardial space). 2. Moderate bilateral pleural fluid collections. On the right, this appears to be a simple pleural effusion, and on the left there appears to be some dependent hemorrhagic debris (not a frank hemothorax). 3. Extensive passive atelectasis in the lower lobes of the lungs bilaterally (right greater than left). 4. Small volume of ascites, mild diffuse mesenteric edema and body wall edema suggesting a state of anasarca. 5. High attenuation in the gallbladder. If there has been recent administration of iodinated contrast material, this is presumably vicarious excretion. Alternatively, this could reflect biliary sludge. Less likely, hemorrhage into the lumen of the gallbladder could be considered. There also appears to be some gallbladder wall edema, which is commonly seen in the setting of ascites and anasarca. If there is any clinical concern for acute gallbladder pathology, further evaluation with right upper quadrant ultrasound could be considered. 6. Atherosclerosis, including left main and 3 vessel coronary artery disease. In addition, there is a 3.5  cm infrarenal abdominal aortic aneurysm. Recommend followup by ultrasound in 2 years. This recommendation follows ACR consensus guidelines: White Paper of the ACR Incidental Findings Committee II on Vascular Findings. J Am Coll Radiol 2013; 10:789-794. 7. Diffuse bronchial wall thickening with  moderate to severe centrilobular and mild to moderate paraseptal emphysema; imaging findings suggestive of underlying COPD. 8. Short segment of mural thickening in the sigmoid colon concerning for potential colonic neoplasm. Further evaluation with nonemergent colonoscopy after resolution of the patient's acute illness is recommended. 9. Additional incidental findings, as above. These results will be called to the ordering clinician or representative by the Radiologist Assistant, and communication documented in the PACS or zVision Dashboard. Electronically Signed   By: Vinnie Langton M.D.   On: 01/11/2016 19:42   US Abdomen Complete  01/11/2016  CLINICAL DATA:  Acute renal failure EXAM: ABDOMEN ULTRASOUND COMPLETE COMPARISON:  None. FINDINGS: Gallbladder: Gallbladder wall appears thickened and edematous measuring up to 9 mm in thickness. No gallstones are evident. Low level echoes are identified within the gallbladder lumen. There is some associated pericholecystic fluid. The sonographer could not report on sonographic Murphy sign given the patient is intubated and ventilator dependent. Common bile duct: Diameter: 3 mm Liver: Coarsening of the echotexture suggests a degree of underlying fatty change. Liver contour appears nodular on some images an underlying cirrhosis cannot be excluded. IVC: No abnormality visualized. Pancreas: Obscured by bowel gas. Spleen: Size and appearance within normal limits. Right Kidney: Length: 10.8 cm. Echogenicity within normal limits. No mass or hydronephrosis visualized. Left Kidney: Length: 11.5 cm. Not well seen, but no evidence for hydronephrosis. Abdominal aorta: Distal aorta obscured by bowel gas. Other findings: Bilateral pleural effusions are evident. There is intra-abdominal ascites with small degree. IMPRESSION: 1. Gallbladder wall thickening without gallstones evident. This could be related to systemic disease in a patient with liver failure. Acalculus cholecystitis  could also have this imaging appearance. 2. Bilateral pleural effusions. 3. No evidence for hydronephrosis. Electronically Signed   By: Misty Stanley M.D.   On: 01/11/2016 14:41   Dg Chest Port 1 View  01/12/2016  CLINICAL DATA:  CABG. EXAM: PORTABLE CHEST 1 VIEW COMPARISON:  CT 01/11/2016.  Chest x-ray 01/11/2016 . FINDINGS: Endotracheal tube, NG tube, left subclavian line in stable position. Prior CABG. Persistent mediastinal prominence and cardiomegaly. Mild increased pulmonary venous congestion. Bilateral pulmonary infiltrates noted on today's exam consistent with pulmonary edema. Bilateral pleural effusions again noted. No pneumothorax. IMPRESSION: 1. Lines and tubes in stable position. 2. Prior CABG. Persistent mediastinal prominence and cardiomegaly, reference made to prior CT report. Interim development of bilateral pulmonary edema. Persistent bilateral pleural effusions. Electronically Signed   By: Marcello Moores  Register   On: 01/12/2016 07:29   Dg Chest Port 1 View  01/11/2016  CLINICAL DATA:  Status post intubation EXAM: PORTABLE CHEST 1 VIEW COMPARISON:  Film from earlier in the same day FINDINGS: Cardiac shadow is stable. An endotracheal tube is now seen approximately 6.7 cm above the carina. A nasogastric catheter is noted coiled within the stomach. The left subclavian line is again seen. Small bilateral pleural effusions are noted, right greater than left. The overall appearance is stable from the previous exam. Old rib fractures are noted. IMPRESSION: Bilateral pleural effusions. Tubes and lines as described in satisfactory position. Electronically Signed   By: Inez Catalina M.D.   On: 01/11/2016 07:05   Dg Chest Port 1 View  01/11/2016  CLINICAL DATA:  Central line placement. EXAM: PORTABLE CHEST 1  VIEW COMPARISON:  01/11/2016 FINDINGS: Postoperative changes in the mediastinum. Cardiac enlargement without vascular congestion. Small right pleural effusion with infiltration or atelectasis in the right  lung base may indicate pneumonia. Left costophrenic angle is not included within the field of view. The left pleural effusions seen on previous study is not included. Left central venous catheter with tip over the upper SVC region. Old bilateral rib fractures. Tortuous aorta. IMPRESSION: Right pleural effusion with infiltration or atelectasis in the right lung base. Previous left pleural effusion is not included within the field of view. Cardiac enlargement without vascular congestion. Electronically Signed   By: Lucienne Capers M.D.   On: 01/11/2016 06:32   Dg Chest Port 1 View  01/11/2016  CLINICAL DATA:  Initial evaluation for acute shortness of breath, to saturation. EXAM: PORTABLE CHEST 1 VIEW COMPARISON:  Prior study from 01/10/2016. FINDINGS: Patient is rotated to the right. Median sternotomy wires with underlying prostatic valve again noted, stable. Cardiomegaly unchanged. Mediastinal silhouette is stable. Interval worsening and bilateral pleural effusions as compared to prior. Associated bibasilar opacities also worsen, which may reflect atelectasis or infiltrates. Changes related COPD again noted. No pulmonary edema. No pneumothorax. Osseous structures unchanged. Remote left-sided rib fractures noted. IMPRESSION: 1. Interval worsening and bilateral pleural effusions, right worse than left, with worsened bibasilar opacities, likely atelectasis. 2. COPD. Electronically Signed   By: Jeannine Boga M.D.   On: 01/11/2016 05:33   CULTURES: 2/19 Respiratory viral panel >> negative 2/19 Sputum >> oral flora 2/19 Pneumococcal ag >> negative 3/6 BC>>> 3/6 sputum>>>  ANTIBIOTICS: 2/18 Zithromax >> 2/21 2/18 Rocephin >> 2/24 3/02 Zinacef>>>off IMipenem 3/6>>> vanc 3/6>>>  STUDIES: 2/18 CT chest >> emphysema, consolidation RLL 2/20 Echo >> EF 35 to 40%, severe AS, 42 mm aortic root, mild MR 2/27 PFT >> FEV1 1.26 (37%), FEV1% 34, TLC 6.17 (90%), DLCO 26%, no BD 3/6 echo>>>poor images, LV and  RF wnl [place] abdo 3/6>>>Gallbladder wall thickening without gallstones evident. This could be related to systemic disease in a patient with liver failure. Acalculus cholecystitis could also have this imaging appearance. 3/6 CT abdo/pelvis/chest>>>small effusions bilateral, small blood collection around graft  EVENTS: 2/18 Admit, cardiology consulted 2/19 To ICU, VDRF 2/21 Rt/Lt heart cath, TCTS consulted 2/26 PAF 3/02 CABG, AVR, replacement of ascending thoracic aortic aneurysm 3/03 Transfuse PRBC 3/6- resp failure, lactic acidosis, ARF, shock liver, ETT  LINES/TUBES: 2/19 ETT >> 2/19 (self extubated) 3/02 ETT >> 3/03 3/02 Rt radial a line >>3/4 3/02 Rt IJ introducer >>3.4 3/6 [place] line>>> 3/6 left fem aline>>>  DISCUSSION: 65 yo male former smoker presented with fever, cough, dyspnea, chills from pneumonia.  He was found to have troponin elevation.  He has hx of COPD/emphysema.  ASSESSMENT/PLAN:  PULMONARY A: Acute on chronic hypoxic/hypercapnic respiratory failure. COPD/emphysema with exacerbation. Uncompensated met acidosis improved Bilateral effusion P: CT reviewed, effusions noted, no role tap at this stage with clinical progress pcxr in am  abg reviewed, consider SBT Seems what put him on vent was uncompensated met acidosis, now improved Start Aggressive SBT, cpap 5 ps 5, goal 1 hr, assess rsbi Would obtain abg at 1 hr Goal even to neg balance with effusions  CARDIOLOGY A: NSTEMI. Severe AS and severe CAD s/p CABG and AVR 3/02. Acute on chronic systolic/diastoli NEW shock, r/;o septic with hypothermia, r;o tamponade c CHF with acute pulmonary edema. PAF >> sinus rhythm now. HLD. P: Dopamine to max 7 mics, if tachy / fib transition to neo, low threshold cvp  18, assess pa pressures to interpret Get cortisol stat Allow pos balance Re assess svo2 Stat echo, I have reviewed , poor images, await read Hold lasix for today, see renal  RENAL A: ARF, likely  ATN, hyperkalemia P: No hydro on imaging Dc bicarb, k wnl Follow crt trend No lasix today Hope renal fxn will take ATN trend and improve in next 36 hrs Change to am labs chem  GASTROENTEROLOGY A: Nutrition. Shock liver likely, some concern with necrosis R/o bowel ischemia- neg CT Doubt portal vein thrombosis, no CT evidence R/o acalculous chole P: NPO CT reviewed, re asuring, if lft climb, may doppler portal vein, although CT neg Bili remains normal, if LFt rise further may have to consider perc drain by IR empiric Assess hep panel baseline Repeat LFt and bili in am Avoid tylenal no more 1 gram / day  HEMATOLOGY A: Anemia of critical illness. coagulapthy from coumadin Thrombocytopenia R/op underlying dic Small amount blood around graft P: F/u CBC in am  Repeat coags scd Assess fibrinogen cvts to assess Transfusion (ffp, etc) needs as relates to small blood around graft Consider vit K low dose with liver noted  INFECTION A: CAP >> completed Abx 2/24. Hypothermia, r/o new source sepsis, not identified but suspcious P: Keep current regimen, await BC results CT reviewed Pct noted up also in setting ARF crt rise May need perc drain gallbladder but not particularly concerning on CT  ENDOCRINE A: No evidence rel AI P: Monitor blood sugars Cortisol noted, no role stress roids ssi  NEUROLOGY A: Post-op pain control. Vent needs HIgh risk with LFT for encpehalopthy P: fent drip WUA mandatory Low threshold lactulose  Ccm time 35 min  Lavon Paganini. Titus Mould, MD, [place] Pgr: [facility]

## 2016-01-12 NOTE — Progress Notes (Signed)
Initial Nutrition Assessment  DOCUMENTATION CODES:   Not applicable  INTERVENTION:    If TF started, recommend Vital AF 1.2 formula -- initiate at 25 ml/hr and increase by 10 ml every 4 hours to goal rate of 65 ml/hr  Above TF regimen to provide 1872 kcals, 117 gm protein, 1265 ml of water  NUTRITION DIAGNOSIS:   Inadequate oral intake related to inability to eat as evidenced by NPO status  GOAL:   Patient will meet greater than or equal to 90% of their needs  MONITOR:   Vent status, Diet advancement, Labs, Weight trends, I & O's  REASON FOR ASSESSMENT:   Ventilator  ASSESSMENT:   65 y.o. Male with a history of COPD who presents to the ED with complaints of Flu like Symptoms with SOB and Cough and fevers and Chills x 6 days. He had worsening SOB for the past 2 days and Chest Tightness and Chest pain since the AM. He was evalauted in the ED and was hypoxic, to 91% and placed on supplemental NCO2. He was also found to have Pneumonia of the RUL and RLL on CTA of the Chest which was without evidence of a Pulmonary Embolism. He was also found to have an elevated troponin at 2.32. He was placed on IV antibiotic rx for CAP, and Cardiology was consulted and he was referred for admission.    Patient s/p procedure 3/2: CORONARY ARTERY BYPASS GRAFT SURGERY x 4  Patient is currently intubated on ventilator support -- OGT in place MV: 8.2 L/min Temp (24hrs), Avg:98.1 F (36.7 C), Min:96.8 F (36 C), Max:99.9 F (37.7 C)   Prior to surgery, pt was on a Heart Healthy/Carbohydrate Modified diet.  Per flowsheet records, PO intake was variable at 25-75%.  RD unable to complete Nutrition Focused Physical Exam at this time.  Diet Order:   NPO  Skin:  Reviewed, no issues  Last BM:  3/6  Height:   Ht Readings from Last 1 Encounters:  12/27/15 5\' 9"  (1.753 m)    Weight:   Wt Readings from Last 1 Encounters:  01/12/16 160 lb 15 oz (73 kg)    Ideal Body Weight:   72.7 kg  BMI:  Body mass index is 23.76 kg/(m^2).  Estimated Nutritional Needs:   Kcal:  1790  Protein:  110-120 gm  Fluid:  per MD  EDUCATION NEEDS:   No education needs identified at this time  Maureen ChattersKatie Dorr Perrot, RD, LDN Pager #: [phone] After-Hours Pager #: [phone]

## 2016-01-12 NOTE — Progress Notes (Signed)
Notified Dr. Laneta SimmersBartle in the OR of increasing BP (170-180 systolic) despite Nitro and metoprolol, verbal order given for hydralazine 10mg  Q4, once off phone patients BP back down to 110-120 systolic 60-70 diastolic, patients BP increases with any type of stimulation, patient denies pain or anxiety, will continue to monitor and let Dr. Laneta SimmersBartle once he is out of the OR.  Hermina BartersBOWMAN, Kanai Berrios M, RN

## 2016-01-12 NOTE — Procedures (Signed)
Extubation Procedure Note  Patient Details:   Name: Craig FeinsteinRobert S Gothard DOB: 12/12/1950 MRN: [id number]   Airway Documentation:     Evaluation  O2 sats: stable throughout Complications: No apparent complications Patient did tolerate procedure well. Bilateral Breath Sounds: Rhonchi, Diminished Suctioning: Airway Yes  NIF-38 FVC-1.2 Placed to 6l/min [place] Flutter device given and instructed.  Newt LukesGroendal, Lakedra Washington Ann 01/12/2016, 2:26 PM

## 2016-01-12 NOTE — Progress Notes (Signed)
TCTS BRIEF SICU PROGRESS NOTE  5 Days Post-Op  S/P Procedure(s) (LRB): CORONARY ARTERY BYPASS GRAFTING (CABG), ON PUMP, TIMES FOUR, USING LEFT INTERNAL MAMMARY ARTERY, BILATERAL GREATER SAPHENOUS VEINS HARVESTED ENDOSCOPICALLY (N/A) TRANSESOPHAGEAL ECHOCARDIOGRAM (TEE) (N/A) CIRC ARREST AND REPLACEMENT OF ASCENDING THORACIC  ANEURYSM (N/A)   Stable day Afib w/ HR 100-110 Breathing comfortably w/ O2 sats 91-95% on 6 L/min although ABG marginal UOP adequate  Plan: Continue current plan.  Watch resp status closely  Purcell Nailslarence H Bobbyjoe Pabst, MD 01/12/2016 6:50 PM

## 2016-01-12 NOTE — Progress Notes (Signed)
5 Days Post-Op Procedure(s) (LRB): CORONARY ARTERY BYPASS GRAFTING (CABG), ON PUMP, TIMES FOUR, USING LEFT INTERNAL MAMMARY ARTERY, BILATERAL GREATER SAPHENOUS VEINS HARVESTED ENDOSCOPICALLY (N/A) TRANSESOPHAGEAL ECHOCARDIOGRAM (TEE) (N/A) CIRC ARREST AND REPLACEMENT OF ASCENDING THORACIC  ANEURYSM (N/A) Subjective:  Just extubated. Says he is doing ok  Had a stable night with rising BP to 170's this am. Urine output has been good. Acidosis corrected.  Objective: Vital signs in last 24 hours: Temp:  [96.8 F (36 C)-99.9 F (37.7 C)] 98.2 F (36.8 C) (03/07 1345) Pulse Rate:  [82-113] 101 (03/07 1345) Cardiac Rhythm:  [-] Atrial fibrillation (03/07 1300) Resp:  [11-21] 13 (03/07 1345) BP: (98-167)/(56-107) 126/82 mmHg (03/07 1345) SpO2:  [92 %-100 %] 97 % (03/07 1345) Arterial Line BP: (104-204)/(65-104) 145/74 mmHg (03/07 1345) FiO2 (%):  [40 %-50 %] 40 % (03/07 1334) Weight:  [73 kg (160 lb 15 oz)] 73 kg (160 lb 15 oz) (03/07 0500)  Hemodynamic parameters for last 24 hours: CVP:  [9 mmHg-16 mmHg] 13 mmHg  Intake/Output from previous day: 03/06 0701 - 03/07 0700 In: 3375.2 [I.V.:1555.2; Blood:670; NG/GT:150; IV Piggyback:1000] Out: 1140 [Urine:790; Emesis/NG output:350] Intake/Output this shift: Total I/O In: 1429.3 [I.V.:408.3; Blood:671; IV Piggyback:350] Out: 1065 [Urine:690; Emesis/NG output:375]  General appearance: alert and cooperative Neurologic: intact Heart: irregularly irregular rhythm Lungs: diminished breath sounds bibasilar Abdomen: soft, non-tender; bowel sounds normal; no masses,  no organomegaly Extremities: moderate peripheral edema. Wound: incisions ok  Lab Results:  Recent Labs  01/11/16 0920 01/12/16 0315  WBC 21.9* 16.7*  HGB 6.2* 9.2*  HCT 19.0* 27.3*  PLT 56* 63*   BMET:  Recent Labs  01/11/16 2227 01/12/16 0315  NA 140 141  K 4.5 4.7  CL 102 100*  CO2 29 30  GLUCOSE 134* 126*  BUN 69* 68*  CREATININE 2.23* 2.39*  CALCIUM  7.3* 7.5*    PT/INR:  Recent Labs  01/12/16 0315  LABPROT 33.7*  INR 3.41*   ABG    Component Value Date/Time   PHART 7.459* 01/12/2016 0430   HCO3 31.4* 01/12/2016 0430   TCO2 32.7 01/12/2016 0430   ACIDBASEDEF 3.0* 01/11/2016 0728   O2SAT 96.7 01/12/2016 0430   CBG (last 3)   Recent Labs  01/12/16 0344 01/12/16 0711 01/12/16 1103  GLUCAP 104* 100* 112*    Assessment/Plan: S/P Procedure(s) (LRB): CORONARY ARTERY BYPASS GRAFTING (CABG), ON PUMP, TIMES FOUR, USING LEFT INTERNAL MAMMARY ARTERY, BILATERAL GREATER SAPHENOUS VEINS HARVESTED ENDOSCOPICALLY (N/A) TRANSESOPHAGEAL ECHOCARDIOGRAM (TEE) (N/A) CIRC ARREST AND REPLACEMENT OF ASCENDING THORACIC  ANEURYSM (N/A)  Development of circulatory shock on POD 4 of unclear etiology associated with hypotension, severe lactic acidosis, oliguria. Treated with fluid and blood resuscitation, correction of acidosis with HCO3, broad spectrum antibiotics, elective intubation due to severe COPD. Echo showed good LV and RV function, normal valvular function, no pericardial effusion. CT chest showed the expected postop fluid/blood in the anterior mediastinum around the graft, small pericardial effusion. CT abdomen and pelvis did not show any obvious pathology. US did show some GB wall thickening and pericholecystic fluid but he has been completely nontender so I doubt acalculous cholecystitis. I really suspect sepsis of undetermined origin. He has rapidly improved with fluid and antibiotics.   Extubated now. He has severe COPD and moderate bilateral pleural effusions on CT. Will need attention to pulmonary toilet.  Acute renal failure due to shock and prerenal azotemia: creat has reached a plateau quickly and urine output ok. Will observe for now.  Shock liver dysfunction  with rise in transaminases to 9483 and 6788, bili 1.6. This should resolve now.  Elevated INR due to coumadin and liver dysfunction. Given FFP and vit K to  reverse.  Continue broad spectrum antibiotics for now.  Postop atrial fib: rate fairly well controlled. No amiodarone due to heart block and liver dysfunction. He will need anticoagulation with coumadin but will hold off for now.  I appreciate CCM care of this critically ill patient.      LOS: 17 days    Alleen Borne 01/12/2016

## 2016-01-13 ENCOUNTER — Inpatient Hospital Stay (HOSPITAL_COMMUNITY): Payer: Medicaid Other

## 2016-01-13 LAB — CBC WITH DIFFERENTIAL/PLATELET
BASOS PCT: 0 %
Basophils Absolute: 0 10*3/uL (ref 0.0–0.1)
Eosinophils Absolute: 0 10*3/uL (ref 0.0–0.7)
Eosinophils Relative: 0 %
HEMATOCRIT: 27.6 % — AB (ref 39.0–52.0)
Hemoglobin: 8.8 g/dL — ABNORMAL LOW (ref 13.0–17.0)
Lymphocytes Relative: 9 %
Lymphs Abs: 1.4 10*3/uL (ref 0.7–4.0)
MCH: 29.4 pg (ref 26.0–34.0)
MCHC: 31.9 g/dL (ref 30.0–36.0)
MCV: 92.3 fL (ref 78.0–100.0)
MONO ABS: 1.1 10*3/uL — AB (ref 0.1–1.0)
MONOS PCT: 7 %
NEUTROS ABS: 13.6 10*3/uL — AB (ref 1.7–7.7)
Neutrophils Relative %: 85 %
PLATELETS: 55 10*3/uL — AB (ref 150–400)
RBC: 2.99 MIL/uL — ABNORMAL LOW (ref 4.22–5.81)
RDW: 16.2 % — AB (ref 11.5–15.5)
WBC: 16.1 10*3/uL — ABNORMAL HIGH (ref 4.0–10.5)

## 2016-01-13 LAB — GLUCOSE, CAPILLARY
GLUCOSE-CAPILLARY: 67 mg/dL (ref 65–99)
Glucose-Capillary: 117 mg/dL — ABNORMAL HIGH (ref 65–99)
Glucose-Capillary: 122 mg/dL — ABNORMAL HIGH (ref 65–99)
Glucose-Capillary: 70 mg/dL (ref 65–99)
Glucose-Capillary: 77 mg/dL (ref 65–99)
Glucose-Capillary: 78 mg/dL (ref 65–99)

## 2016-01-13 LAB — COMPREHENSIVE METABOLIC PANEL
ALBUMIN: 3.1 g/dL — AB (ref 3.5–5.0)
ALK PHOS: 88 U/L (ref 38–126)
ALT: 4274 U/L — AB (ref 17–63)
AST: 2932 U/L — ABNORMAL HIGH (ref 15–41)
Anion gap: 12 (ref 5–15)
BUN: 69 mg/dL — ABNORMAL HIGH (ref 6–20)
CALCIUM: 8 mg/dL — AB (ref 8.9–10.3)
CO2: 29 mmol/L (ref 22–32)
CREATININE: 2.12 mg/dL — AB (ref 0.61–1.24)
Chloride: 100 mmol/L — ABNORMAL LOW (ref 101–111)
GFR calc Af Amer: 36 mL/min — ABNORMAL LOW (ref >60)
GFR calc non Af Amer: 31 mL/min — ABNORMAL LOW (ref >60)
GLUCOSE: 77 mg/dL (ref 65–99)
Potassium: 5.1 mmol/L (ref 3.5–5.1)
SODIUM: 141 mmol/L (ref 135–145)
Total Bilirubin: 2.2 mg/dL — ABNORMAL HIGH (ref 0.3–1.2)
Total Protein: 5.2 g/dL — ABNORMAL LOW (ref 6.5–8.1)

## 2016-01-13 LAB — PREPARE FRESH FROZEN PLASMA
UNIT DIVISION: 0
Unit division: 0

## 2016-01-13 LAB — HEPATITIS PANEL, ACUTE
HCV Ab: <0.1 s/co ratio (ref 0.0–0.9)
HEP A IGM: NEGATIVE
HEP B C IGM: NEGATIVE
HEP B S AG: NEGATIVE

## 2016-01-13 LAB — PROCALCITONIN: Procalcitonin: 1.47 ng/mL

## 2016-01-13 LAB — APTT: aPTT: 32 seconds (ref 24–37)

## 2016-01-13 LAB — PROTIME-INR
INR: 1.75 — ABNORMAL HIGH (ref 0.00–1.49)
Prothrombin Time: 20.4 seconds — ABNORMAL HIGH (ref 11.6–15.2)

## 2016-01-13 MED ORDER — DEXTROSE-NACL 5-0.45 % IV SOLN
INTRAVENOUS | Status: DC
  Administered 2016-01-13 – 2016-01-15 (×2): via INTRAVENOUS

## 2016-01-13 MED ORDER — FAMOTIDINE 20 MG PO TABS
20.0000 mg | ORAL_TABLET | Freq: Every day | ORAL | Status: DC
  Administered 2016-01-13 – 2016-02-03 (×21): 20 mg via ORAL
  Filled 2016-01-13 (×21): qty 1

## 2016-01-13 MED ORDER — WARFARIN - PHYSICIAN DOSING INPATIENT
Freq: Every day | Status: DC
  Administered 2016-01-13: 18:00:00

## 2016-01-13 MED ORDER — METOPROLOL TARTRATE 25 MG PO TABS
25.0000 mg | ORAL_TABLET | Freq: Two times a day (BID) | ORAL | Status: DC
  Administered 2016-01-13 – 2016-01-21 (×15): 25 mg via ORAL
  Filled 2016-01-13 (×16): qty 1

## 2016-01-13 MED ORDER — WARFARIN SODIUM 2.5 MG PO TABS: 2.5000 mg | ORAL_TABLET | Freq: Once | ORAL | Status: DC

## 2016-01-13 NOTE — Progress Notes (Signed)
Pt with minimal UOP with moderate sized clot at beginning of shift.  Unable to flush foley.  Dr. Donata ClayVan Trigt notified, orders received to remove current foley and replace.  New foley inserted without difficulty, clear yellow then bloody urine returned.  Will continue to monitor.  Roselie AwkwardShannon Girtrude Enslin, RN

## 2016-01-13 NOTE — Progress Notes (Addendum)
Hypoglycemic Event  CBG: 67  Treatment: 15 GM carbohydrate snack  Symptoms: None  Follow-up CBG: Time: 0800 CBG Result: 70  Possible Reasons for Event: Inadequate meal intake  Comments/MD notified:Dr. Bartle notified during am rounds    Craig [name] M

## 2016-01-13 NOTE — Progress Notes (Addendum)
6 Days Post-Op Procedure(s) (LRB): CORONARY ARTERY BYPASS GRAFTING (CABG), ON PUMP, TIMES FOUR, USING LEFT INTERNAL MAMMARY ARTERY, BILATERAL GREATER SAPHENOUS VEINS HARVESTED ENDOSCOPICALLY (N/A) TRANSESOPHAGEAL ECHOCARDIOGRAM (TEE) (N/A) CIRC ARREST AND REPLACEMENT OF ASCENDING THORACIC  ANEURYSM (N/A) Subjective:  No complaints. Slept some. Denies abdominal pain. Taking ice chips  Objective: Vital signs in last 24 hours: Temp:  [98.1 F (36.7 C)-99.3 F (37.4 C)] 98.4 F (36.9 C) (03/08 0800) Pulse Rate:  [50-117] 88 (03/08 0800) Cardiac Rhythm:  [-] Atrial fibrillation (03/08 0737) Resp:  [7-25] 21 (03/08 0800) BP: (98-163)/(63-105) 115/68 mmHg (03/08 0800) SpO2:  [88 %-100 %] 96 % (03/08 0820) Arterial Line BP: (104-172)/(62-94) 139/69 mmHg (03/08 0800) FiO2 (%):  [40 %-50 %] 40 % (03/07 1334) Weight:  [76 kg (167 lb 8.8 oz)] 76 kg (167 lb 8.8 oz) (03/08 0500)  Hemodynamic parameters for last 24 hours: CVP:  [11 mmHg-17 mmHg] 17 mmHg  Intake/Output from previous day: 03/07 0701 - 03/08 0700 In: 2308 [I.V.:987; Blood:671; IV Piggyback:650] Out: 1875 [Urine:1500; Emesis/NG output:375] Intake/Output this shift: Total I/O In: 40 [I.V.:40] Out: 125 [Urine:125]  General appearance: alert and cooperative Neurologic: intact Heart: irregularly irregular rhythm Lungs: diminished breath sounds bibasilar Abdomen: soft, non-tender; bowel sounds normal; no masses,  no organomegaly Extremities: edema moderate Wound: incisions ok  Lab Results:  Recent Labs  01/12/16 0315 01/13/16 0347  WBC 16.7* 16.1*  HGB 9.2* 8.8*  HCT 27.3* 27.6*  PLT 63* 55*   BMET:  Recent Labs  01/12/16 0315 01/13/16 0347  NA 141 141  K 4.7 5.1  CL 100* 100*  CO2 30 29  GLUCOSE 126* 77  BUN 68* 69*  CREATININE 2.39* 2.12*  CALCIUM 7.5* 8.0*    PT/INR:  Recent Labs  01/13/16 0347  LABPROT 20.4*  INR 1.75*   ABG    Component Value Date/Time   PHART 7.366 01/12/2016 1529   HCO3  33.4* 01/12/2016 1529   TCO2 35 01/12/2016 1529   ACIDBASEDEF 3.0* 01/11/2016 0728   O2SAT 86.0 01/12/2016 1529   CBG (last 3)   Recent Labs  01/13/16 0007 01/13/16 0406 01/13/16 0811  GLUCAP 78 77 70    Assessment/Plan: S/P Procedure(s) (LRB): CORONARY ARTERY BYPASS GRAFTING (CABG), ON PUMP, TIMES FOUR, USING LEFT INTERNAL MAMMARY ARTERY, BILATERAL GREATER SAPHENOUS VEINS HARVESTED ENDOSCOPICALLY (N/A) TRANSESOPHAGEAL ECHOCARDIOGRAM (TEE) (N/A) CIRC ARREST AND REPLACEMENT OF ASCENDING THORACIC  ANEURYSM (N/A)  He remains hemodynamically stable off pressors. Rhythm is atrial fibrillation 100-110. Will switch Lopressor to po. Remove arterial line so he can get out of bed.  Pulmonary status stable. CXR looks a little better. He still has moderate bilateral effusions and bibasilar compressive atelectasis.  Renal function is improving and urine output good. He has significant volume excess with weight 28 lbs over preop. Will wait until renal function normalizes before diuresing.  Shock liver: transaminases improving.  Will resume low dose coumadin for atrial fibrillation in a few days if he remains stable especially with prosthetic aortic valve. Goal INR 2-2.5.  OOB, IS, flutter valve.  Leukocytosis improved and stable at 16K. I am assuming this was related to some septic event. I can't explain it otherwise. BC negative so far. Continue antibiotics.  Thrombocytopenia: down slightly today. Observe. No heparin or lovenox  SCD's for DVT prophylaxis.     LOS: 18 days    Alleen BorneBryan K Bartle 01/13/2016

## 2016-01-13 NOTE — Progress Notes (Signed)
PCCM PROGRESS NOTE  ADMISSION DATE: 12/26/2015 CONSULT DATE: 12/27/2015 REFERRING PROVIDER: [facility]  CC: Short of breath  SUBJECTIVE: Off pressors.  Feels weak, but breathing better.  VITAL SIGNS: BP 108/70 mmHg  Pulse 71  Temp(Src) 99 F (37.2 C) (Core (Comment))  Resp 24  Ht  (1.753 m)  Wt 167 lb 8.8 oz (76 kg)  BMI 24.73 kg/m2  SpO2 93%  INTAKE/OUTPUT: I/O last 3 completed shifts: In: 3442.5 [I.V.:1831.5; Blood:671; NG/GT:90; IV Piggyback:850] Out: 2330 [Urine:1955; Emesis/NG output:375]  General: alert HEENT: no stridor Cardiac: regular, 2/6 SM Chest: scattered crackles Abd: soft, non tender Ext: no edema Neuro: normal strength Skin: no rashes   CBC Recent Labs     01/11/16  0920  01/12/16  0315  01/13/16  0347  WBC  21.9*  16.7*  16.1*  HGB  6.2*  9.2*  8.8*  HCT  19.0*  27.3*  27.6*  PLT  56*  63*  55*    Coag's Recent Labs     01/11/16  0138  01/12/16  0315  01/13/16  0347  APTT   --   36  32  INR  2.00*  3.41*  1.75*    BMET Recent Labs     01/11/16  2227  01/12/16  0315  01/13/16  0347  NA  140  141  141  K  4.5  4.7  5.1  CL  102  100*  100*  CO2  BUN  69*  68*  69*  CREATININE  2.23*  2.39*  2.12*  GLUCOSE  134*  126*  77   LFT's Lab Results  Component Value Date   ALT 4274* 01/13/2016   AST 2932* 01/13/2016   ALKPHOS 88 01/13/2016   BILITOT 2.2* 01/13/2016    Electrolytes Recent Labs     01/11/16  2227  01/12/16  0315  01/13/16  0347  CALCIUM  7.3*  7.5*  8.0*    ABG Recent Labs     01/11/16  1354  01/12/16  0430  01/12/16  1529  PHART  7.450  7.459*  7.366  PCO2ART  43.7  44.8  58.1*  PO2ART  97.0  93.2  54.0*    Glucose Recent Labs     01/12/16  1103  01/12/16  1526  01/12/16  1941  01/13/16  0007  01/13/16  0406  01/13/16  0811  GLUCAP  112*  137*  94  78  77  70    Imaging Ct Abdomen Pelvis Wo Contrast  01/11/2016  CLINICAL DATA:  65 year old male with history of sepsis.  EXAM: CT CHEST, ABDOMEN AND PELVIS WITHOUT CONTRAST TECHNIQUE: Multidetector CT imaging of the chest, abdomen and pelvis was performed following the standard protocol without IV contrast. COMPARISON:  Chest CT 12/26/2015. FINDINGS: CT CHEST FINDINGS Mediastinum/Lymph Nodes: Compared to the prior examination there has been median sternotomy for aortic valve replacement and graft replacement of the ascending thoracic aorta. There is a stented bioprosthesis noted in the aortic valve position. Adjacent to the ascending thoracic aortic graft there is a large amount of high attenuation in the surrounding mediastinum. Some of this may represent hemorrhage in the wrap of the native aorta around the graft, while much of this appears to be outside the wrap in the middle/anterior mediastinum. Heart size is normal. Small amount of pericardial fluid and/or thickening, which is intermediate to high attenuation (44 HU), likely to reflect a small amount of  blood in the pericardial space. This is unlikely to be of hemodynamic significance at this time. There is atherosclerosis of the thoracic aorta, the great vessels of the mediastinum and the coronary arteries, including calcified atherosclerotic plaque in the left main, left anterior descending, left circumflex and right coronary arteries. No pathologically enlarged mediastinal or hilar lymph nodes. Please note that accurate exclusion of hilar adenopathy is limited on noncontrast CT scans. Esophagus is unremarkable in appearance. Endotracheal tube in position approximately 6.5 cm above the carina. Nasogastric tube extends into the stomach. Epicardial pacing wires are noted. Lungs/Pleura: There are moderate bilateral pleural effusions layering dependently. On the right, fluid is low-attenuation and simple in appearance. On the left, there is some high attenuation debris, likely some hemorrhage. Diffuse bronchial wall thickening with moderate to severe centrilobular and mild to  moderate paraseptal emphysema. Areas of passive atelectasis are noted in the lower lobes of the lungs bilaterally (right greater than left). No definite acute consolidative airspace disease. Musculoskeletal/Soft Tissues: Median sternotomy wires. Multiple old healed left-sided posterior lateral rib fractures are noted. There are no aggressive appearing lytic or blastic lesions noted in the visualized portions of the skeleton. CT ABDOMEN AND PELVIS FINDINGS Hepatobiliary: No definite cystic or solid hepatic lesions are identified on today's noncontrast CT examination. There is some high attenuation material lying dependently in the gallbladder, which may represent biliary sludge, vicarious excretion of contrast material from recently administered IV iodinated contrast, or could represent hemorrhage within the gallbladder (unlikely). Gallbladder wall is poorly delineated on today's noncontrast CT examination, but appears potentially thickened measuring up to 12 mm. Small amount of fluid adjacent to the liver appears to be part of a a general volume of ascites. Pancreas: No definite pancreatic mass or peripancreatic inflammatory changes. Spleen: Unremarkable. Adrenals/Urinary Tract: Several tiny nonobstructive calculi in the collecting systems of the kidneys bilaterally measuring 2-3 mm. Unenhanced appearance of the kidneys is otherwise unremarkable. Bilateral adrenal glands are normal in appearance. No hydroureteronephrosis. Urinary bladder is completely decompressed with a Foley balloon catheter in place. Stomach/Bowel: The appearance of the stomach is normal. There is no pathologic dilatation of small bowel or colon. Several colonic diverticulae are noted in the region of the sigmoid colon. No overt surrounding inflammatory changes are identified at this time to strongly suggest an acute diverticulitis (although assessment is limited by small volume of ascites). There is a portion of the sigmoid colon which appears  thickened, best appreciated on axial image 99 of series 2 and coronal image 24 of series 4; although this portion of the colon appears relatively decompressed and this may represent pseudothickening, the possibility of a colonic lesion is not excluded. Normal appendix. Vascular/Lymphatic: Extensive atherosclerosis throughout the abdominal and pelvic vasculature, including fusiform aneurysmal dilatation of the infrarenal abdominal aorta which measures up to 3.5 cm in diameter. No definite lymphadenopathy noted in the abdomen or pelvis on today's noncontrast CT examination. Reproductive: Prostate gland and seminal vesicles are unremarkable in appearance. Other: Small volume of ascites. Mild diffuse mesenteric edema. Trace amount of retroperitoneal fluid. Diffuse body wall edema. No pneumoperitoneum. Musculoskeletal: There are no aggressive appearing lytic or blastic lesions noted in the visualized portions of the skeleton. IMPRESSION: 1. Small amount of hemorrhage in the middle and anterior mediastinum adjacent to the ascending aortic graft. Some of this appears to extend into the pericardial space (this is small volume at this time, unlikely to be of hemodynamic significance in the pericardial space). 2. Moderate bilateral pleural fluid collections. On the  right, this appears to be a simple pleural effusion, and on the left there appears to be some dependent hemorrhagic debris (not a frank hemothorax). 3. Extensive passive atelectasis in the lower lobes of the lungs bilaterally (right greater than left). 4. Small volume of ascites, mild diffuse mesenteric edema and body wall edema suggesting a state of anasarca. 5. High attenuation in the gallbladder. If there has been recent administration of iodinated contrast material, this is presumably vicarious excretion. Alternatively, this could reflect biliary sludge. Less likely, hemorrhage into the lumen of the gallbladder could be considered. There also appears to be some  gallbladder wall edema, which is commonly seen in the setting of ascites and anasarca. If there is any clinical concern for acute gallbladder pathology, further evaluation with right upper quadrant ultrasound could be considered. 6. Atherosclerosis, including left main and 3 vessel coronary artery disease. In addition, there is a 3.5 cm infrarenal abdominal aortic aneurysm. Recommend followup by ultrasound in 2 years. This recommendation follows ACR consensus guidelines: White Paper of the ACR Incidental Findings Committee II on Vascular Findings. J Am Coll Radiol 2013; 10:789-794. 7. Diffuse bronchial wall thickening with moderate to severe centrilobular and mild to moderate paraseptal emphysema; imaging findings suggestive of underlying COPD. 8. Short segment of mural thickening in the sigmoid colon concerning for potential colonic neoplasm. Further evaluation with nonemergent colonoscopy after resolution of the patient's acute illness is recommended. 9. Additional incidental findings, as above. These results will be called to the ordering clinician or representative by the Radiologist Assistant, and communication documented in the PACS or zVision Dashboard. Electronically Signed   By: Trudie Reed M.D.   On: 01/11/2016 19:42   Ct Chest Wo Contrast  01/11/2016  CLINICAL DATA:  65 year old male with history of sepsis. EXAM: CT CHEST, ABDOMEN AND PELVIS WITHOUT CONTRAST TECHNIQUE: Multidetector CT imaging of the chest, abdomen and pelvis was performed following the standard protocol without IV contrast. COMPARISON:  Chest CT 12/26/2015. FINDINGS: CT CHEST FINDINGS Mediastinum/Lymph Nodes: Compared to the prior examination there has been median sternotomy for aortic valve replacement and graft replacement of the ascending thoracic aorta. There is a stented bioprosthesis noted in the aortic valve position. Adjacent to the ascending thoracic aortic graft there is a large amount of high attenuation in the  surrounding mediastinum. Some of this may represent hemorrhage in the wrap of the native aorta around the graft, while much of this appears to be outside the wrap in the middle/anterior mediastinum. Heart size is normal. Small amount of pericardial fluid and/or thickening, which is intermediate to high attenuation (44 HU), likely to reflect a small amount of blood in the pericardial space. This is unlikely to be of hemodynamic significance at this time. There is atherosclerosis of the thoracic aorta, the great vessels of the mediastinum and the coronary arteries, including calcified atherosclerotic plaque in the left main, left anterior descending, left circumflex and right coronary arteries. No pathologically enlarged mediastinal or hilar lymph nodes. Please note that accurate exclusion of hilar adenopathy is limited on noncontrast CT scans. Esophagus is unremarkable in appearance. Endotracheal tube in position approximately 6.5 cm above the carina. Nasogastric tube extends into the stomach. Epicardial pacing wires are noted. Lungs/Pleura: There are moderate bilateral pleural effusions layering dependently. On the right, fluid is low-attenuation and simple in appearance. On the left, there is some high attenuation debris, likely some hemorrhage. Diffuse bronchial wall thickening with moderate to severe centrilobular and mild to moderate paraseptal emphysema. Areas of passive atelectasis  are noted in the lower lobes of the lungs bilaterally (right greater than left). No definite acute consolidative airspace disease. Musculoskeletal/Soft Tissues: Median sternotomy wires. Multiple old healed left-sided posterior lateral rib fractures are noted. There are no aggressive appearing lytic or blastic lesions noted in the visualized portions of the skeleton. CT ABDOMEN AND PELVIS FINDINGS Hepatobiliary: No definite cystic or solid hepatic lesions are identified on today's noncontrast CT examination. There is some high  attenuation material lying dependently in the gallbladder, which may represent biliary sludge, vicarious excretion of contrast material from recently administered IV iodinated contrast, or could represent hemorrhage within the gallbladder (unlikely). Gallbladder wall is poorly delineated on today's noncontrast CT examination, but appears potentially thickened measuring up to 12 mm. Small amount of fluid adjacent to the liver appears to be part of a a general volume of ascites. Pancreas: No definite pancreatic mass or peripancreatic inflammatory changes. Spleen: Unremarkable. Adrenals/Urinary Tract: Several tiny nonobstructive calculi in the collecting systems of the kidneys bilaterally measuring 2-3 mm. Unenhanced appearance of the kidneys is otherwise unremarkable. Bilateral adrenal glands are normal in appearance. No hydroureteronephrosis. Urinary bladder is completely decompressed with a Foley balloon catheter in place. Stomach/Bowel: The appearance of the stomach is normal. There is no pathologic dilatation of small bowel or colon. Several colonic diverticulae are noted in the region of the sigmoid colon. No overt surrounding inflammatory changes are identified at this time to strongly suggest an acute diverticulitis (although assessment is limited by small volume of ascites). There is a portion of the sigmoid colon which appears thickened, best appreciated on axial image 99 of series 2 and coronal image 24 of series 4; although this portion of the colon appears relatively decompressed and this may represent pseudothickening, the possibility of a colonic lesion is not excluded. Normal appendix. Vascular/Lymphatic: Extensive atherosclerosis throughout the abdominal and pelvic vasculature, including fusiform aneurysmal dilatation of the infrarenal abdominal aorta which measures up to 3.5 cm in diameter. No definite lymphadenopathy noted in the abdomen or pelvis on today's noncontrast CT examination. Reproductive:  Prostate gland and seminal vesicles are unremarkable in appearance. Other: Small volume of ascites. Mild diffuse mesenteric edema. Trace amount of retroperitoneal fluid. Diffuse body wall edema. No pneumoperitoneum. Musculoskeletal: There are no aggressive appearing lytic or blastic lesions noted in the visualized portions of the skeleton. IMPRESSION: 1. Small amount of hemorrhage in the middle and anterior mediastinum adjacent to the ascending aortic graft. Some of this appears to extend into the pericardial space (this is small volume at this time, unlikely to be of hemodynamic significance in the pericardial space). 2. Moderate bilateral pleural fluid collections. On the right, this appears to be a simple pleural effusion, and on the left there appears to be some dependent hemorrhagic debris (not a frank hemothorax). 3. Extensive passive atelectasis in the lower lobes of the lungs bilaterally (right greater than left). 4. Small volume of ascites, mild diffuse mesenteric edema and body wall edema suggesting a state of anasarca. 5. High attenuation in the gallbladder. If there has been recent administration of iodinated contrast material, this is presumably vicarious excretion. Alternatively, this could reflect biliary sludge. Less likely, hemorrhage into the lumen of the gallbladder could be considered. There also appears to be some gallbladder wall edema, which is commonly seen in the setting of ascites and anasarca. If there is any clinical concern for acute gallbladder pathology, further evaluation with right upper quadrant ultrasound could be considered. 6. Atherosclerosis, including left main and 3 vessel coronary artery  disease. In addition, there is a 3.5 cm infrarenal abdominal aortic aneurysm. Recommend followup by ultrasound in 2 years. This recommendation follows ACR consensus guidelines: White Paper of the ACR Incidental Findings Committee II on Vascular Findings. J Am Coll Radiol 2013; 10:789-794. 7.  Diffuse bronchial wall thickening with moderate to severe centrilobular and mild to moderate paraseptal emphysema; imaging findings suggestive of underlying COPD. 8. Short segment of mural thickening in the sigmoid colon concerning for potential colonic neoplasm. Further evaluation with nonemergent colonoscopy after resolution of the patient's acute illness is recommended. 9. Additional incidental findings, as above. These results will be called to the ordering clinician or representative by the Radiologist Assistant, and communication documented in the PACS or zVision Dashboard. Electronically Signed   By: Trudie Reed M.D.   On: 01/11/2016 19:42   US Abdomen Complete  01/11/2016  CLINICAL DATA:  Acute renal failure EXAM: ABDOMEN ULTRASOUND COMPLETE COMPARISON:  None. FINDINGS: Gallbladder: Gallbladder wall appears thickened and edematous measuring up to 9 mm in thickness. No gallstones are evident. Low level echoes are identified within the gallbladder lumen. There is some associated pericholecystic fluid. The sonographer could not report on sonographic Murphy sign given the patient is intubated and ventilator dependent. Common bile duct: Diameter: 3 mm Liver: Coarsening of the echotexture suggests a degree of underlying fatty change. Liver contour appears nodular on some images an underlying cirrhosis cannot be excluded. IVC: No abnormality visualized. Pancreas: Obscured by bowel gas. Spleen: Size and appearance within normal limits. Right Kidney: Length: 10.8 cm. Echogenicity within normal limits. No mass or hydronephrosis visualized. Left Kidney: Length: 11.5 cm. Not well seen, but no evidence for hydronephrosis. Abdominal aorta: Distal aorta obscured by bowel gas. Other findings: Bilateral pleural effusions are evident. There is intra-abdominal ascites with small degree. IMPRESSION: 1. Gallbladder wall thickening without gallstones evident. This could be related to systemic disease in a patient with  liver failure. Acalculus cholecystitis could also have this imaging appearance. 2. Bilateral pleural effusions. 3. No evidence for hydronephrosis. Electronically Signed   By: Kennith Center M.D.   On: 01/11/2016 14:41   Dg Chest Port 1 View  01/13/2016  CLINICAL DATA:  Status post CABG and thoracic aortic aneurysm repair on January 07, 2016 EXAM: PORTABLE CHEST 1 VIEW COMPARISON:  Portable chest x-ray dated January 12, 2016 FINDINGS: Interval extubation of the trachea and esophagus. The lungs are well-expanded. There are small bilateral pleural effusions which are slightly less conspicuous today. The pulmonary interstitial markings are also less engorged. The pulmonary vascularity is more distinct. The cardiac silhouette remains enlarged. The sternal wires are intact. A prosthetic aortic valve ring is visible. The left subclavian venous catheter tip projects over the proximal SVC. O bilateral posterior rib fractures are observed. IMPRESSION: Further interval decrease in pulmonary interstitial edema and bilateral pleural effusions consistent with improving CHF and resolving postsurgical changes. Underlying COPD. Electronically Signed   By: David  [place] M.D.   On: 01/13/2016 07:44   Dg Chest Port 1 View  01/12/2016  CLINICAL DATA:  CABG. EXAM: PORTABLE CHEST 1 VIEW COMPARISON:  CT 01/11/2016.  Chest x-ray 01/11/2016 . FINDINGS: Endotracheal tube, NG tube, left subclavian line in stable position. Prior CABG. Persistent mediastinal prominence and cardiomegaly. Mild increased pulmonary venous congestion. Bilateral pulmonary infiltrates noted on today's exam consistent with pulmonary edema. Bilateral pleural effusions again noted. No pneumothorax. IMPRESSION: 1. Lines and tubes in stable position. 2. Prior CABG. Persistent mediastinal prominence and cardiomegaly, reference made to prior CT report. Interim  development of bilateral pulmonary edema. Persistent bilateral pleural effusions. Electronically Signed   By: Maisie Fus   Register   On: 01/12/2016 07:29   CULTURES: 2/19 Respiratory viral panel >> negative 2/19 Sputum >> oral flora 2/19 Pneumococcal ag >> negative 3/06 Blood >>  ANTIBIOTICS: 2/18 Zithromax >> 2/21 2/18 Rocephin >> 2/24 3/02 Zinacef>>>off 3/06 Imipenem >> 3/06 Vancomycin >>  STUDIES: 2/18 CT chest >> emphysema, consolidation RLL 2/20 Echo >> EF 35 to 40%, severe AS, 42 mm aortic root, mild MR 2/27 PFT >> FEV1 1.26 (37%), FEV1% 34, TLC 6.17 (90%), DLCO 26%, no BD 3/06 Echo >> normal LV/RV systolic fx 3/06 Abd u/s >> GB wall thickening w/o gallstones 3/06 CT abd/pelvis >> GB sludge, non obstructive renal stones b/l, diverticulosis 3/06 CT chest >> b/l effusions, centrilobular and paraseptal emphysema, compressive ATX  EVENTS: 2/18 Admit, cardiology consulted 2/19 To ICU, VDRF 2/21 Rt/Lt heart cath, TCTS consulted 2/26 PAF 3/02 CABG, AVR, replacement of ascending thoracic aortic aneurysm 3/03 Transfuse PRBC 3/06 resp failure, lactic acidosis, ARF, shock liver, ETT 3/08 Off pressors  LINES/TUBES: 2/19 ETT >> 2/19 (self extubated) 3/02 ETT >> 3/03 3/02 Rt radial a line >>3/04 3/02 Rt IJ introducer >>3/04 3/06 ETT >> 3/07 3/06 Lt [place] CVL 3/06 >> 3/06 Lt femoral CVL >> 3/08  DISCUSSION: 65 yo male former smoker presented with fever, cough, dyspnea, chills from pneumonia.  He was found to have troponin elevation.  He has hx of COPD/emphysema.  ASSESSMENT/PLAN:  PULMONARY A: Acute on chronic hypoxic/hypercapnic respiratory failure. COPD/emphysema with exacerbation. B/l pleural effusions. P: Oxygen to keep SpO2 90 to 95% F/u CXR Negative fluid balance as tolerated Continue spiriva, dulera, prn albuterol  CARDIOLOGY A: NSTEMI. Severe AS and severe CAD s/p CABG and AVR 3/02. Acute on chronic systolic/diastolic CHF. New shock 3/06 >> likely from sepsis. PAF >> sinus rhythm now. HLD. P: Continue lopressor Coumadin per TCTS  RENAL A: ARF likely from  ATN. Hyperkalemia. P: Monitor renal fx, urine outpt, electrolytes  GASTROENTEROLOGY A: Nutrition. Shock liver. P: Advance diet as tolerated F/u LFTs  HEMATOLOGY A: Anemia of critical illness. Thrombocytopenia. P: F/u CBC  INFECTION A: CAP >> completed Abx 2/24. Septic shock 3/06 >> possible sources are line vs respiratory vs gallbladder >> improving. P: Day 3 of vancomycin, imipenem F/u procalcitonin  ENDOCRINE A: Hyperglycemia. P: SSI  NEUROLOGY A: Post-op pain control. Deconditioning. P: Prn fentanyl Mobilize as able  Updated pt's family at bedside.  Coralyn Helling, MD [facility] Pulmonary/Critical Care 01/13/2016, 9:49 AM Pager:  [phone] After 3pm call: [phone]

## 2016-01-13 NOTE — Progress Notes (Signed)
CT surgery p.m. Rounds  Sitting up in a chair taking clear liquid diet without complaint Rate controlled atrial fibrillation with stable blood pressure Oxygen saturation stable on 4 L No abdominal tenderness Afebrile Urine output adequate this p.m. Continue current care

## 2016-01-14 ENCOUNTER — Inpatient Hospital Stay (HOSPITAL_COMMUNITY): Payer: Medicaid Other

## 2016-01-14 LAB — COMPREHENSIVE METABOLIC PANEL
ALT: 2781 U/L — ABNORMAL HIGH (ref 17–63)
AST: 1040 U/L — AB (ref 15–41)
Albumin: 3.1 g/dL — ABNORMAL LOW (ref 3.5–5.0)
Alkaline Phosphatase: 107 U/L (ref 38–126)
Anion gap: 16 — ABNORMAL HIGH (ref 5–15)
BUN: 68 mg/dL — AB (ref 6–20)
CHLORIDE: 96 mmol/L — AB (ref 101–111)
CO2: 29 mmol/L (ref 22–32)
Calcium: 8.1 mg/dL — ABNORMAL LOW (ref 8.9–10.3)
Creatinine, Ser: 1.78 mg/dL — ABNORMAL HIGH (ref 0.61–1.24)
GFR calc Af Amer: 45 mL/min — ABNORMAL LOW (ref >60)
GFR, EST NON AFRICAN AMERICAN: 39 mL/min — AB (ref >60)
Glucose, Bld: 117 mg/dL — ABNORMAL HIGH (ref 65–99)
POTASSIUM: 4.8 mmol/L (ref 3.5–5.1)
Sodium: 141 mmol/L (ref 135–145)
Total Bilirubin: 3.1 mg/dL — ABNORMAL HIGH (ref 0.3–1.2)
Total Protein: 5.3 g/dL — ABNORMAL LOW (ref 6.5–8.1)

## 2016-01-14 LAB — CBC
HEMATOCRIT: 29.3 % — AB (ref 39.0–52.0)
Hemoglobin: 9.5 g/dL — ABNORMAL LOW (ref 13.0–17.0)
MCH: 30.5 pg (ref 26.0–34.0)
MCHC: 32.4 g/dL (ref 30.0–36.0)
MCV: 94.2 fL (ref 78.0–100.0)
Platelets: 55 10*3/uL — ABNORMAL LOW (ref 150–400)
RBC: 3.11 MIL/uL — AB (ref 4.22–5.81)
RDW: 16 % — AB (ref 11.5–15.5)
WBC: 15 10*3/uL — AB (ref 4.0–10.5)

## 2016-01-14 LAB — GLUCOSE, CAPILLARY
GLUCOSE-CAPILLARY: 102 mg/dL — AB (ref 65–99)
GLUCOSE-CAPILLARY: 102 mg/dL — AB (ref 65–99)
GLUCOSE-CAPILLARY: 126 mg/dL — AB (ref 65–99)
GLUCOSE-CAPILLARY: 128 mg/dL — AB (ref 65–99)
Glucose-Capillary: 106 mg/dL — ABNORMAL HIGH (ref 65–99)
Glucose-Capillary: 116 mg/dL — ABNORMAL HIGH (ref 65–99)
Glucose-Capillary: 150 mg/dL — ABNORMAL HIGH (ref 65–99)
Glucose-Capillary: 99 mg/dL (ref 65–99)

## 2016-01-14 LAB — PROTIME-INR
INR: 1.8 — AB (ref 0.00–1.49)
PROTHROMBIN TIME: 20.8 s — AB (ref 11.6–15.2)

## 2016-01-14 LAB — PROCALCITONIN: Procalcitonin: 1.18 ng/mL

## 2016-01-14 MED ORDER — SODIUM CHLORIDE 0.9% FLUSH: 10.0000 mL | INTRAVENOUS | Status: DC | PRN

## 2016-01-14 MED ORDER — FUROSEMIDE 10 MG/ML IJ SOLN
80.0000 mg | Freq: Once | INTRAMUSCULAR | Status: AC
  Administered 2016-01-14: 80 mg via INTRAVENOUS
  Filled 2016-01-14: qty 8

## 2016-01-14 MED ORDER — ARFORMOTEROL TARTRATE 15 MCG/2ML IN NEBU
15.0000 ug | INHALATION_SOLUTION | Freq: Two times a day (BID) | RESPIRATORY_TRACT | Status: DC
  Administered 2016-01-14 – 2016-02-03 (×38): 15 ug via RESPIRATORY_TRACT
  Filled 2016-01-14 (×40): qty 2

## 2016-01-14 MED ORDER — BUDESONIDE 0.5 MG/2ML IN SUSP
0.5000 mg | Freq: Two times a day (BID) | RESPIRATORY_TRACT | Status: DC
  Administered 2016-01-14 – 2016-02-03 (×38): 0.5 mg via RESPIRATORY_TRACT
  Filled 2016-01-14 (×41): qty 2

## 2016-01-14 MED ORDER — SODIUM CHLORIDE 0.9% FLUSH
10.0000 mL | Freq: Two times a day (BID) | INTRAVENOUS | Status: DC
  Administered 2016-01-14: 20 mL
  Administered 2016-01-15: 10 mL
  Administered 2016-01-15: 20 mL
  Administered 2016-01-16 – 2016-01-19 (×5): 10 mL

## 2016-01-14 NOTE — Progress Notes (Signed)
7 Days Post-Op Procedure(s) (LRB): CORONARY ARTERY BYPASS GRAFTING (CABG), ON PUMP, TIMES FOUR, USING LEFT INTERNAL MAMMARY ARTERY, BILATERAL GREATER SAPHENOUS VEINS HARVESTED ENDOSCOPICALLY (N/A) TRANSESOPHAGEAL ECHOCARDIOGRAM (TEE) (N/A) CIRC ARREST AND REPLACEMENT OF ASCENDING THORACIC  ANEURYSM (N/A) Subjective:  Sleepy but says he feels like he is struggling some with breathing  Objective: Vital signs in last 24 hours: Temp:  [95.7 F (35.4 C)-99 F (37.2 C)] 95.7 F (35.4 C) (03/09 0700) Pulse Rate:  [48-100] 96 (03/09 0800) Cardiac Rhythm:  [-] Atrial fibrillation (03/09 0800) Resp:  [13-26] 20 (03/09 0800) BP: (102-151)/(70-97) 120/85 mmHg (03/09 0800) SpO2:  [92 %-100 %] 100 % (03/09 0800) Weight:  [79.2 kg (174 lb 9.7 oz)] 79.2 kg (174 lb 9.7 oz) (03/09 0500)  Hemodynamic parameters for last 24 hours: CVP:  [15 mmHg-18 mmHg] 18 mmHg  Intake/Output from previous day: 03/08 0701 - 03/09 0700 In: 1719.8 [P.O.:840; I.V.:279.8; IV Piggyback:600] Out: 1325 [Urine:1325] Intake/Output this shift: Total I/O In: 10 [I.V.:10] Out: 60 [Urine:60]  General appearance: lethargic Neurologic: intact Heart: irregularly irregular rhythm Lungs: diminished breath sounds bibasilar Abdomen: soft, non-tender; bowel sounds normal; no masses,  no organomegaly Extremities: edema moderate Wound: incision ok  Lab Results:  Recent Labs  01/13/16 0347 01/14/16 0405  WBC 16.1* 15.0*  HGB 8.8* 9.5*  HCT 27.6* 29.3*  PLT 55* 55*   BMET:  Recent Labs  01/13/16 0347 01/14/16 0405  NA 141 141  K 5.1 4.8  CL 100* 96*  CO2 29 29  GLUCOSE 77 117*  BUN 69* 68*  CREATININE 2.12* 1.78*  CALCIUM 8.0* 8.1*    PT/INR:  Recent Labs  01/14/16 0405  LABPROT 20.8*  INR 1.80*   ABG    Component Value Date/Time   PHART 7.366 01/12/2016 1529   HCO3 33.4* 01/12/2016 1529   TCO2 35 01/12/2016 1529   ACIDBASEDEF 3.0* 01/11/2016 0728   O2SAT 86.0 01/12/2016 1529   CBG (last 3)    Recent Labs  01/13/16 2340 01/14/16 0342 01/14/16 0739  GLUCAP 106* 102* 116*   CLINICAL DATA: CABG.  EXAM: PORTABLE CHEST 1 VIEW  COMPARISON: 03/08/ 2017.  FINDINGS: Left subclavian line in stable position. Prior CABG. Cardiomegaly. Continued clearing of bilateral pulmonary edema and pleural effusions consistent with improving congestive heart failure . No pneumothorax.  IMPRESSION: 1. Left subclavian line stable position.  2. Cardiomegaly with bilateral pulmonary infiltrates and pleural effusions consistent congestive heart failure. Interim improvement from prior exam.   Electronically Signed  By: Maisie Fushomas Register  On: 01/14/2016 07:41  Assessment/Plan: S/P Procedure(s) (LRB): CORONARY ARTERY BYPASS GRAFTING (CABG), ON PUMP, TIMES FOUR, USING LEFT INTERNAL MAMMARY ARTERY, BILATERAL GREATER SAPHENOUS VEINS HARVESTED ENDOSCOPICALLY (N/A) TRANSESOPHAGEAL ECHOCARDIOGRAM (TEE) (N/A) CIRC ARREST AND REPLACEMENT OF ASCENDING THORACIC  ANEURYSM (N/A)  He is hemodynamically stable only on Lopressor 25 bid.  Atrial fibrillation with controlled rate. INR is 1.80, up from 1.75 yesterday with no coumadin. Will wait to resume coumadin and see what INR is tomorrow since platelet count is low and bleeding risk increased.  Severe COPD with bilateral pleural effusions and some pulmonary congestion on CXR. This is improved from yesterday but I think his respiratory status is still borderline. Weight is 30 lbs over preop if accurate. Will start diuretic. I have been waiting to see if creatinine would continue to improve and it is. He may need bipap.  Possible sepsis responsible for decompensation on Monday morning. BC negative. I would continue broad spectrum antibiotics for a week course.  IS, mobilize as tolerated  Nutrition   LOS: 19 days    Alleen Borne 01/14/2016

## 2016-01-14 NOTE — Progress Notes (Signed)
      [address]       ,[place] [id number]             [phone]       On BIPAP   BP 105/82 mmHg  Pulse 88  Temp(Src) 97.4 F (36.3 C) (Axillary)  Resp 11  Ht 5\' 9"  (1.753 m)  Wt 174 lb 9.7 oz (79.2 kg)  BMI 25.77 kg/m2  SpO2 97%   Intake/Output Summary (Last 24 hours) at 01/14/16 1804 Last data filed at 01/14/16 1700  Gross per 24 hour  Intake   1430 ml  Output   2325 ml  Net   -895 ml   CBG OK No PM labs  Continue current care  Rotunda Worden C. Dorris FetchHendrickson, MD [facility] [phone]

## 2016-01-14 NOTE — Progress Notes (Signed)
PCCM PROGRESS NOTE  ADMISSION DATE: 12/26/2015 CONSULT DATE: 12/27/2015 REFERRING PROVIDER: [facility]  CC: Short of breath  SUBJECTIVE: Started on BiPAP.  Feels sleepy.  VITAL SIGNS: BP 104/73 mmHg  Pulse 95  Temp(Src) 95.7 F (35.4 C) (Axillary)  Resp 20  Ht  (1.753 m)  Wt 174 lb 9.7 oz (79.2 kg)  BMI 25.77 kg/m2  SpO2 99%  INTAKE/OUTPUT: I/O last 3 completed shifts: In: 2176.6 [P.O.:840; I.V.:536.6; IV Piggyback:800] Out: 1930 [Urine:1930]  General: sitting in chair HEENT: no stridor Cardiac: regular, 2/6 SM Chest: scattered crackles Abd: soft, non tender Ext: no edema Neuro: sleepy, wakes up easily, normal strength Skin: no rashes   CBC Recent Labs     01/12/16  0315  01/13/16  0347  01/14/16  0405  WBC  16.7*  16.1*  15.0*  HGB  9.2*  8.8*  9.5*  HCT  27.3*  27.6*  29.3*  PLT  63*  55*  55*    Coag's Recent Labs     01/12/16  0315  01/13/16  0347  01/14/16  0405  APTT  36  32   --   INR  3.41*  1.75*  1.80*    BMET Recent Labs     01/12/16  0315  01/13/16  0347  01/14/16  0405  NA  141  141  141  K  4.7  5.1  4.8  CL  100*  100*  96*  CO2  BUN  68*  69*  68*  CREATININE  2.39*  2.12*  1.78*  GLUCOSE  126*  77  117*   LFT's Lab Results  Component Value Date   ALT 2781* 01/14/2016   AST 1040* 01/14/2016   ALKPHOS 107 01/14/2016   BILITOT 3.1* 01/14/2016    Electrolytes Recent Labs     01/12/16  0315  01/13/16  0347  01/14/16  0405  CALCIUM  7.5*  8.0*  8.1*    ABG Recent Labs     01/11/16  1354  01/12/16  0430  01/12/16  1529  PHART  7.450  7.459*  7.366  PCO2ART  43.7  44.8  58.1*  PO2ART  97.0  93.2  54.0*    Glucose Recent Labs     01/13/16  0811  01/13/16  1137  01/13/16  1946  01/13/16  2340  01/14/16  0342  01/14/16  0739  GLUCAP  70  122*  117*  106*  102*  116*    Imaging Dg Chest Port 1 View  01/14/2016  CLINICAL DATA:  CABG. EXAM: PORTABLE CHEST 1 VIEW COMPARISON:  03/08/ 2017.  FINDINGS: Left subclavian line in stable position. Prior CABG. Cardiomegaly. Continued clearing of bilateral pulmonary edema and pleural effusions consistent with improving congestive heart failure . No pneumothorax. IMPRESSION: 1.  Left subclavian line stable position. 2. Cardiomegaly with bilateral pulmonary infiltrates and pleural effusions consistent congestive heart failure. Interim improvement from prior exam. Electronically Signed   By: Maisie Fus  Register   On: 01/14/2016 07:41   Dg Chest Port 1 View  01/13/2016  CLINICAL DATA:  Status post CABG and thoracic aortic aneurysm repair on January 07, 2016 EXAM: PORTABLE CHEST 1 VIEW COMPARISON:  Portable chest x-ray dated January 12, 2016 FINDINGS: Interval extubation of the trachea and esophagus. The lungs are well-expanded. There are small bilateral pleural effusions which are slightly less conspicuous today. The pulmonary interstitial markings are also less engorged. The pulmonary vascularity is  more distinct. The cardiac silhouette remains enlarged. The sternal wires are intact. A prosthetic aortic valve ring is visible. The left subclavian venous catheter tip projects over the proximal SVC. O bilateral posterior rib fractures are observed. IMPRESSION: Further interval decrease in pulmonary interstitial edema and bilateral pleural effusions consistent with improving CHF and resolving postsurgical changes. Underlying COPD. Electronically Signed   By: David  SwazilandJordan M.D.   On: 01/13/2016 07:44   CULTURES: 2/19 Respiratory viral panel >> negative 2/19 Sputum >> oral flora 2/19 Pneumococcal ag >> negative 3/06 Blood >>  ANTIBIOTICS: 2/18 Zithromax >> 2/21 2/18 Rocephin >> 2/24 3/02 Zinacef>>>off 3/06 Imipenem >> 3/06 Vancomycin >>  STUDIES: 2/18 CT chest >> emphysema, consolidation RLL 2/20 Echo >> EF 35 to 40%, severe AS, 42 mm aortic root, mild MR 2/27 PFT >> FEV1 1.26 (37%), FEV1% 34, TLC 6.17 (90%), DLCO 26%, no BD 3/06 Echo >> normal LV/RV  systolic fx 3/06 Abd u/s >> GB wall thickening w/o gallstones 3/06 CT abd/pelvis >> GB sludge, non obstructive renal stones b/l, diverticulosis 3/06 CT chest >> b/l effusions, centrilobular and paraseptal emphysema, compressive ATX  EVENTS: 2/18 Admit, cardiology consulted 2/19 To ICU, VDRF 2/21 Rt/Lt heart cath, TCTS consulted 2/26 PAF 3/02 CABG, AVR, replacement of ascending thoracic aortic aneurysm 3/03 Transfuse PRBC 3/06 resp failure, lactic acidosis, ARF, shock liver, ETT 3/08 Off pressors  LINES/TUBES: 2/19 ETT >> 2/19 (self extubated) 3/02 ETT >> 3/03 3/02 Rt radial a line >>3/04 3/02 Rt IJ introducer >>3/04 3/06 ETT >> 3/07 3/06 Lt [place] CVL 3/06 >> 3/06 Lt femoral CVL >> 3/08  DISCUSSION: 65 yo male former smoker presented with fever, cough, dyspnea, chills from pneumonia.  He was found to have troponin elevation.  He has hx of COPD/emphysema.  ASSESSMENT/PLAN:  PULMONARY A: Acute on chronic hypoxic/hypercapnic respiratory failure. COPD/emphysema with exacerbation. B/l pleural effusions. P: Oxygen to keep SpO2 90 to 95% BiPAP prn F/u CXR Negative fluid balance as tolerated Change to brovana, pulmicort, spiriva Prn albuterol Lasix per TCTS  CARDIOLOGY A: NSTEMI. Severe AS and severe CAD s/p CABG and AVR 3/02. Acute on chronic systolic/diastolic CHF. New shock 3/06 >> likely from sepsis. PAF >> sinus rhythm now. HLD. P: Continue lopressor Coumadin per TCTS  RENAL A: ARF likely from ATN. Hyperkalemia. P: Monitor renal fx, urine outpt, electrolytes  GASTROENTEROLOGY A: Nutrition. Shock liver >> LFTs trending down. P: Advance diet per TCTS F/u LFTs  HEMATOLOGY A: Anemia of critical illness. Thrombocytopenia. P: F/u CBC  INFECTION A: CAP >> completed Abx 2/24. Septic shock 3/06 >> possible sources are line vs respiratory vs gallbladder >> improving. P: Day 4/7 of vancomycin, imipenem F/u  procalcitonin  ENDOCRINE A: Hyperglycemia. P: SSI  NEUROLOGY A: Post-op pain control. Deconditioning. P: Prn fentanyl Mobilize as able  CC time 32 minutes.  Coralyn HellingVineet Ricka Westra, MD [facility] 01/14/2016, 10:58 AM Pager:  [phone] After 3pm call: [phone]

## 2016-01-14 NOTE — Progress Notes (Signed)
Pharmacy Antibiotic Note  Craig FeinsteinRobert S Konicki is a 65 y.o. male admitted on 12/26/2015 with pneumonia. Pharmacy has been consulted for vanc/imipenem dosing. To continue broad spectrum abx for at least a week per Dr. Sharee PimpleBartle's note - D#4. No history of seizures noted. Afeb, wbc 15 (down). LA 13.6>>1.4, PCT trend down to 1.18. AKI - SCr improving to 1.79, CrCl~41.  Plan: Vanc 1g IV every 24 hours. Goal trough 15-20 Imipenem 250mg  IV q6h Monitor clinical progress, c/s, renal function, abx plan/LOT VT soon if continuing   Height: 5\' 9"  (175.3 cm) Weight: 174 lb 9.7 oz (79.2 kg) IBW/kg (Calculated) : 70.7  Temp (24hrs), Avg:97.8 F (36.6 C), Min:95.7 F (35.4 C), Max:99 F (37.2 C)   Recent Labs Lab 01/11/16 0450  01/11/16 [id number] 01/11/16 0840 01/11/16 0920 01/11/16 1130 01/11/16 1545 01/11/16 2227 01/12/16 0315 01/13/16 0347 01/14/16 0405  WBC  --   --  22.7*  --  21.9*  --   --   --  16.7* 16.1* 15.0*  CREATININE  --   < > 2.36*  --  2.31* 2.35*  --  2.23* 2.39* 2.12* 1.78*  LATICACIDVEN 13.6*  --   --  9.5*  --   --  2.9*  --  1.4  --   --   < > = values in this interval not displayed.  Estimated Creatinine Clearance: 41.9 mL/min (by C-G formula based on Cr of 1.78).    No Known Allergies  Antimicrobials this admission: Imipenem 3/6 >> Vanc 3/6 >> Ceftriaxone 2/18 >> 2/24 Azith 2/18 >> 2/22 Tamiflu 2/18>>2/21  Dose adjustments this admission: n/a  Microbiology results: 3/6 BCx2: ngtd 2/19 flu: neg 2/19 resp cx: NF 2/19 mrsa: neg 2/19 strep pneumo: neg  Babs BertinHaley Zion Lint, PharmD, BCPS Clinical Pharmacist Pager [phone] 01/14/2016 10:54 AM

## 2016-01-14 NOTE — Progress Notes (Signed)
eLink Physician-Brief Progress Note Patient Name: Craig FeinsteinRobert S Higham DOB: 07/20/1951 MRN: [id number]   Date of Service  01/14/2016  HPI/Events of Note  Camera check on patient requiring intermittent BiPAP for respiratory support. Patient reports she's been off of BiPAP for 15 minutes. Speech is not pressured. Mildly increased work of breathing. Vital seem relatively stable at this time. Denies any dyspnea at this time.  eICU Interventions  Continue close monitoring in the intensive care unit with intermittent BiPAP for respiratory support.     Intervention Category Major Interventions: Respiratory failure - evaluation and management  Lawanda CousinsJennings Kinneth Fujiwara 01/14/2016, 9:47 PM

## 2016-01-15 ENCOUNTER — Encounter (HOSPITAL_COMMUNITY): Payer: Self-pay | Admitting: *Deleted

## 2016-01-15 ENCOUNTER — Inpatient Hospital Stay (HOSPITAL_COMMUNITY): Payer: Medicaid Other

## 2016-01-15 LAB — CBC
HEMATOCRIT: 31.3 % — AB (ref 39.0–52.0)
Hemoglobin: 9.9 g/dL — ABNORMAL LOW (ref 13.0–17.0)
MCH: 29.6 pg (ref 26.0–34.0)
MCHC: 31.6 g/dL (ref 30.0–36.0)
MCV: 93.7 fL (ref 78.0–100.0)
Platelets: 69 10*3/uL — ABNORMAL LOW (ref 150–400)
RBC: 3.34 MIL/uL — ABNORMAL LOW (ref 4.22–5.81)
RDW: 16 % — AB (ref 11.5–15.5)
WBC: 15.2 10*3/uL — AB (ref 4.0–10.5)

## 2016-01-15 LAB — GLUCOSE, CAPILLARY
GLUCOSE-CAPILLARY: 113 mg/dL — AB (ref 65–99)
GLUCOSE-CAPILLARY: 124 mg/dL — AB (ref 65–99)
Glucose-Capillary: 125 mg/dL — ABNORMAL HIGH (ref 65–99)
Glucose-Capillary: 95 mg/dL (ref 65–99)
Glucose-Capillary: 110 mg/dL — ABNORMAL HIGH (ref 65–99)

## 2016-01-15 LAB — COMPREHENSIVE METABOLIC PANEL
ALT: 1876 U/L — ABNORMAL HIGH (ref 17–63)
AST: 463 U/L — AB (ref 15–41)
Albumin: 2.9 g/dL — ABNORMAL LOW (ref 3.5–5.0)
Alkaline Phosphatase: 103 U/L (ref 38–126)
Anion gap: 13 (ref 5–15)
BILIRUBIN TOTAL: 3.4 mg/dL — AB (ref 0.3–1.2)
BUN: 66 mg/dL — AB (ref 6–20)
CO2: 32 mmol/L (ref 22–32)
CREATININE: 1.65 mg/dL — AB (ref 0.61–1.24)
Calcium: 8.2 mg/dL — ABNORMAL LOW (ref 8.9–10.3)
Chloride: 96 mmol/L — ABNORMAL LOW (ref 101–111)
GFR, EST AFRICAN AMERICAN: 49 mL/min — AB (ref >60)
GFR, EST NON AFRICAN AMERICAN: 42 mL/min — AB (ref >60)
Glucose, Bld: 118 mg/dL — ABNORMAL HIGH (ref 65–99)
POTASSIUM: 3.7 mmol/L (ref 3.5–5.1)
Sodium: 141 mmol/L (ref 135–145)
TOTAL PROTEIN: 5.1 g/dL — AB (ref 6.5–8.1)

## 2016-01-15 LAB — PROTIME-INR
INR: 1.56 — ABNORMAL HIGH (ref 0.00–1.49)
Prothrombin Time: 18.7 seconds — ABNORMAL HIGH (ref 11.6–15.2)

## 2016-01-15 LAB — PROCALCITONIN: Procalcitonin: 1.08 ng/mL

## 2016-01-15 MED ORDER — FUROSEMIDE 10 MG/ML IJ SOLN
80.0000 mg | Freq: Once | INTRAMUSCULAR | Status: AC
  Administered 2016-01-15: 80 mg via INTRAVENOUS
  Filled 2016-01-15: qty 8

## 2016-01-15 MED ORDER — SODIUM CHLORIDE 0.9 % IV SOLN
500.0000 mg | Freq: Three times a day (TID) | INTRAVENOUS | Status: DC
  Administered 2016-01-15 – 2016-01-18 (×9): 500 mg via INTRAVENOUS
  Filled 2016-01-15 (×12): qty 500

## 2016-01-15 MED ORDER — ENSURE ENLIVE PO LIQD
237.0000 mL | Freq: Two times a day (BID) | ORAL | Status: DC
  Administered 2016-01-19 – 2016-01-24 (×8): 237 mL via ORAL

## 2016-01-15 NOTE — Progress Notes (Signed)
UR Completed. Cassidi Modesitt, RN, BSN.  [phone] 

## 2016-01-15 NOTE — Progress Notes (Signed)
Patient ID: Craig FeinsteinRobert S [name], male   DOB: 06/04/1951, 65 y.o.   MRN: [id number] TCTS DAILY ICU PROGRESS NOTE                   [address]            [place] [id number]          [phone]   8 Days Post-Op Procedure(s) (LRB): CORONARY ARTERY BYPASS GRAFTING (CABG), ON PUMP, TIMES FOUR, USING LEFT INTERNAL MAMMARY ARTERY, BILATERAL GREATER SAPHENOUS VEINS HARVESTED ENDOSCOPICALLY (N/A) TRANSESOPHAGEAL ECHOCARDIOGRAM (TEE) (N/A) CIRC ARREST AND REPLACEMENT OF ASCENDING THORACIC  ANEURYSM (N/A)  Total Length of Stay:  LOS: 20 days   Subjective: Staying off vent   Objective: Vital signs in last 24 hours: Temp:  [97 F (36.1 C)-98.4 F (36.9 C)] 97 F (36.1 C) (03/10 0713) Pulse Rate:  [85-108] 88 (03/10 1000) Cardiac Rhythm:  [-] Atrial fibrillation (03/10 0900) Resp:  [11-29] 19 (03/10 1000) BP: (100-165)/(73-121) 130/88 mmHg (03/10 1000) SpO2:  [92 %-100 %] 100 % (03/10 1000) FiO2 (%):  [30 %-40 %] 40 % (03/10 0935) Weight:  [171 lb 15.3 oz (78 kg)] 171 lb 15.3 oz (78 kg) (03/10 0600)  Filed Weights   01/13/16 0500 01/14/16 0500 01/15/16 0600  Weight: 167 lb 8.8 oz (76 kg) 174 lb 9.7 oz (79.2 kg) 171 lb 15.3 oz (78 kg)    Weight change: -2 lb 10.3 oz (-1.2 kg)   Hemodynamic parameters for last 24 hours: CVP:  [13 mmHg-16 mmHg] 16 mmHg  Intake/Output from previous day: 03/09 0701 - 03/10 0700 In: 1440 [P.O.:600; I.V.:240; IV Piggyback:600] Out: 2620 [Urine:2620]  Intake/Output this shift: Total I/O In: 220 [I.V.:20; IV Piggyback:200] Out: 175 [Urine:175]  Current Meds: Scheduled Meds: . antiseptic oral rinse  7 mL Mouth Rinse BID  . arformoterol  15 mcg Nebulization BID  . budesonide (PULMICORT) nebulizer solution  0.5 mg Nebulization BID  . famotidine  20 mg Oral Daily  . furosemide  80 mg Intravenous Once  . imipenem-cilastatin  250 mg Intravenous 4 times per day  . insulin aspart  0-24 Units Subcutaneous 6 times per day  . metoprolol  tartrate  25 mg Oral BID  . sodium chloride flush  10-40 mL Intracatheter Q12H  . sodium chloride flush  3 mL Intravenous Q12H  . tiotropium  18 mcg Inhalation Daily  . vancomycin  1,000 mg Intravenous Q24H  . Warfarin - Physician Dosing Inpatient   Does not apply q1800   Continuous Infusions: . dextrose 5 % and 0.45% NaCl 10 mL/hr at 01/15/16 0900   PRN Meds:.fentaNYL, hydrALAZINE, ondansetron (ZOFRAN) IV, sodium chloride flush, sodium chloride flush  General appearance: alert and cooperative Neurologic: intact Heart: regular rate and rhythm, S1, S2 normal, no murmur, click, rub or gallop Lungs: diminished breath sounds bilaterally Abdomen: soft, non-tender; bowel sounds normal; no masses,  no organomegaly Extremities: extremities normal, atraumatic, no cyanosis or edema and Homans sign is negative, no sign of DVT Wound: sternum intact  Lab Results: CBC: Recent Labs  01/14/16 0405 01/15/16 0410  WBC 15.0* 15.2*  HGB 9.5* 9.9*  HCT 29.3* 31.3*  PLT 55* 69*   BMET:  Recent Labs  01/14/16 0405 01/15/16 0410  NA 141 141  K 4.8 3.7  CL 96* 96*  CO2 29 32  GLUCOSE 117* 118*  BUN 68* 66*  CREATININE 1.78* 1.65*  CALCIUM 8.1* 8.2*    PT/INR:  Recent Labs  01/15/16 0410  LABPROT 18.7*  INR 1.56*   Radiology: Dg Chest Port 1 View  01/15/2016  CLINICAL DATA:  Aortic valve replacement. EXAM: PORTABLE CHEST 1 VIEW COMPARISON:  01/14/2016. FINDINGS: Left PICC line in stable position. Prior aortic valve replacement. Cardiomegaly with progressive bilateral pulmonary infiltrates and pleural effusions consistent congestive heart failure. Low lung volumes with basilar atelectasis. No pneumothorax . IMPRESSION: 1. Left PICC line stable position. 2. Prior aortic valve replacement. Cardiomegaly with progressive bilateral pulmonary infiltrates and pleural effusions consistent congestive heart failure. Electronically Signed   By: Maisie Fus  Register   On: 01/15/2016 07:29      Assessment/Plan: S/P Procedure(s) (LRB): CORONARY ARTERY BYPASS GRAFTING (CABG), ON PUMP, TIMES FOUR, USING LEFT INTERNAL MAMMARY ARTERY, BILATERAL GREATER SAPHENOUS VEINS HARVESTED ENDOSCOPICALLY (N/A) TRANSESOPHAGEAL ECHOCARDIOGRAM (TEE) (N/A) CIRC ARREST AND REPLACEMENT OF ASCENDING THORACIC  ANEURYSM (N/A) Atrial fibrillation with controlled rate. INR is 1.80, up from 1.75 yesterday with no coumadin.  Will wait to resume coumadin and see what INR is tomorrow since platelet count is low and bleeding risk increased.  Severe COPD with bilateral pleural effusions and some pulmonary congestion on CXR. This is improved from yesterday but I think his respiratory status is still borderline. Weight is 30 lbs over preop if accurate.  Will start diuretic. Continue bipap. Tap pleural effusions , right first then left  No coumadin  For now      Craig [name] 01/15/2016 10:22 AM

## 2016-01-15 NOTE — Progress Notes (Signed)
This RN has attempted to coordinate with Ultrasound for thoracentesis to be performed at the bedside as pt respiratory status waxes and wanes.  The pt is requiring BiPap off and on.  Ultrasound informed of need for thoracentesis to help with respiratory status.  Ultrasound department unable to schedule pt for ordered thoracentesis.  Dr. Tyrone SageGerhardt made aware of delay in treatment.  Ultrasound to coordinate procedure tomorrow, 3/11.  Will continue to monitor pt closely.

## 2016-01-15 NOTE — Progress Notes (Signed)
Pharmacy Antibiotic Note  Craig FeinsteinRobert S [name] is a 65 y.o. male admitted on 12/26/2015 with pneumonia. Pharmacy has been consulted for vanc/imipenem dosing. To continue broad spectrum abx for at least a week per Dr. Sharee PimpleBartle's note - D#5. No history of seizures noted. Afeb, wbc 15.2 (stable). LA 13.6>>1.4, PCT trend down to 1.08. AKI - SCr improving to 1.65, CrCl~45.  Plan: Vanc 1g IV every 24 hours. Goal trough 15-20 Imipenem to 500mg  IV q8h Monitor clinical progress, c/s, renal function, abx plan/LOT VT soon if continuing   Height: 5\' 9"  (175.3 cm) Weight: 171 lb 15.3 oz (78 kg) IBW/kg (Calculated) : 70.7  Temp (24hrs), Avg:97.6 F (36.4 C), Min:97 F (36.1 C), Max:98.4 F (36.9 C)   Recent Labs Lab 01/11/16 0450  01/11/16 0840 01/11/16 0920  01/11/16 1545 01/11/16 2227 01/12/16 0315 01/13/16 0347 01/14/16 0405 01/15/16 0410  WBC  --   < >  --  21.9*  --   --   --  16.7* 16.1* 15.0* 15.2*  CREATININE  --   < >  --  2.31*  < >  --  2.23* 2.39* 2.12* 1.78* 1.65*  LATICACIDVEN 13.6*  --  9.5*  --   --  2.9*  --  1.4  --   --   --   < > = values in this interval not displayed.  Estimated Creatinine Clearance: 45.2 mL/min (by C-G formula based on Cr of 1.65).    No Known Allergies  Antimicrobials this admission: Imipenem 3/6 >> Vanc 3/6 >> Ceftriaxone 2/18 >> 2/24 Azith 2/18 >> 2/22 Tamiflu 2/18>>2/21  Dose adjustments this admission: n/a  Microbiology results: 3/6 BCx2: ngtd 2/19 flu: neg 2/19 resp cx: NF 2/19 mrsa: neg 2/19 strep pneumo: neg  Craig [name], PharmD, BCPS Clinical Pharmacist Pager [phone] 01/15/2016 11:08 AM

## 2016-01-15 NOTE — Progress Notes (Signed)
Nutrition Follow-up  INTERVENTION:  -Provide Ensure Enlive BID, 350 kcal, 20 grams of protein per bottle.   NUTRITION DIAGNOSIS:   Inadequate oral intake related to poor appetite as evidenced by  (RN report).  GOAL:   Patient will meet greater than or equal to 90% of their needs  -Progressing  MONITOR:   PO intake, Supplement acceptance, Labs, Weight trends, I & O's  ASSESSMENT:   65 y.o. Male with a history of COPD who presents to the ED with complaints of Flu like Symptoms with SOB and Cough and fevers and Chills x 6 days. He had worsening SOB for the past 2 days and Chest Tightness and Chest pain since the AM. He was evalauted in the ED and was hypoxic, to 91% and placed on supplemental NCO2. He was also found to have Pneumonia of the RUL and RLL on CTA of the Chest which was without evidence of a Pulmonary Embolism. He was also found to have an elevated troponin at 2.32. He was placed on IV antibiotic rx for CAP, and Cardiology was consulted and he was referred for admission.   3/8- Transitioned to full liquid diet  Pt receiving breathing treatment during time of visit, spoke with pt's RN. RN reports his appetite is poor this morning only consumed coffee and juice, did not eat his oatmeal. Will order Ensure Enlive to help meet pt's nutritional needs.   Diet Order:  Diet full liquid Room service appropriate?: Yes; Fluid consistency:: Thin  Skin:  Reviewed, no issues  Last BM:  01/14/2016  Height:   Ht Readings from Last 1 Encounters:  12/27/15 5\' 9"  (1.753 m)    Weight:   Wt Readings from Last 1 Encounters:  01/15/16 171 lb 15.3 oz (78 kg)    Ideal Body Weight:  72.7 kg  BMI:  Body mass index is 25.38 kg/(m^2).  Estimated Nutritional Needs:   Kcal:  2000-2200  Protein:  110-120 gm  Fluid:  per MD  EDUCATION NEEDS:   No education needs identified at this time  Brynda RimBrittany Kacey Vicuna, Dietetic Intern Pager: [phone]

## 2016-01-15 NOTE — Progress Notes (Signed)
Ultrasound called to reiterate need for bedside thoracentesis performed today, 01/15/2016.  Per ultrasound rep, pt will be done as schedule allows since this RN requests procedure to be done at bedside d/t pt's respiratory status.  Will continue to monitor pt closely.

## 2016-01-15 NOTE — Progress Notes (Signed)
PCCM PROGRESS NOTE  ADMISSION DATE: 12/26/2015 CONSULT DATE: 12/27/2015 REFERRING PROVIDER: [facility]  CC: Short of breath  SUBJECTIVE: Unable to come off BiPAP.  VITAL SIGNS: BP 165/94 mmHg  Pulse 91  Temp(Src) 97 F (36.1 C) (Axillary)  Resp 21  Ht 5\' 9"  (1.753 m)  Wt 171 lb 15.3 oz (78 kg)  BMI 25.38 kg/m2  SpO2 100%  INTAKE/OUTPUT: I/O last 3 completed shifts: In: 2360 [P.O.:1200; I.V.:360; IV Piggyback:800] Out: 3335 [Urine:3335]  General: sitting in chair HEENT: BiPAP mask on Cardiac: regular, 2/6 SM Chest: decreased BS at bases, no wheeze Abd: soft, non tender Ext: no edema Neuro: alert, follows commands Skin: no rashes   CBC Recent Labs     01/13/16  0347  01/14/16  0405  01/15/16  0410  WBC  16.1*  15.0*  15.2*  HGB  8.8*  9.5*  9.9*  HCT  27.6*  29.3*  31.3*  PLT  55*  55*  69*    Coag's Recent Labs     01/13/16  0347  01/14/16  0405  01/15/16  0410  APTT  32   --    --   INR  1.75*  1.80*  1.56*    BMET Recent Labs     01/13/16  0347  01/14/16  0405  01/15/16  0410  NA  141  141  141  K  5.1  4.8  3.7  CL  100*  96*  96*  CO2  29  29  32  BUN  69*  68*  66*  CREATININE  2.12*  1.78*  1.65*  GLUCOSE  77  117*  118*   LFT's Lab Results  Component Value Date   ALT 1876* 01/15/2016   AST 463* 01/15/2016   ALKPHOS 103 01/15/2016   BILITOT 3.4* 01/15/2016    Electrolytes Recent Labs     01/13/16  0347  01/14/16  0405  01/15/16  0410  CALCIUM  8.0*  8.1*  8.2*    ABG Recent Labs     01/12/16  1529  PHART  7.366  PCO2ART  58.1*  PO2ART  54.0*    Glucose Recent Labs     01/14/16  1204  01/14/16  1502  01/14/16  1934  01/14/16  2350  01/15/16  0401  01/15/16  0708  GLUCAP  128*  150*  126*  102*  113*  125*    Imaging Dg Chest Port 1 View  01/15/2016  CLINICAL DATA:  Aortic valve replacement. EXAM: PORTABLE CHEST 1 VIEW COMPARISON:  01/14/2016. FINDINGS: Left PICC line in stable position. Prior aortic valve  replacement. Cardiomegaly with progressive bilateral pulmonary infiltrates and pleural effusions consistent congestive heart failure. Low lung volumes with basilar atelectasis. No pneumothorax . IMPRESSION: 1. Left PICC line stable position. 2. Prior aortic valve replacement. Cardiomegaly with progressive bilateral pulmonary infiltrates and pleural effusions consistent congestive heart failure. Electronically Signed   By: Maisie Fushomas  Register   On: 01/15/2016 07:29   Dg Chest Port 1 View  01/14/2016  CLINICAL DATA:  CABG. EXAM: PORTABLE CHEST 1 VIEW COMPARISON:  03/08/ 2017. FINDINGS: Left subclavian line in stable position. Prior CABG. Cardiomegaly. Continued clearing of bilateral pulmonary edema and pleural effusions consistent with improving congestive heart failure . No pneumothorax. IMPRESSION: 1.  Left subclavian line stable position. 2. Cardiomegaly with bilateral pulmonary infiltrates and pleural effusions consistent congestive heart failure. Interim improvement from prior exam. Electronically Signed   By: Maisie Fushomas  Register  On: 01/14/2016 07:41   CULTURES: 2/19 Respiratory viral panel >> negative 2/19 Sputum >> oral flora 2/19 Pneumococcal ag >> negative 3/06 Blood >>  ANTIBIOTICS: 2/18 Zithromax >> 2/21 2/18 Rocephin >> 2/24 3/06 Imipenem >> 3/06 Vancomycin >>  STUDIES: 2/18 CT chest >> emphysema, consolidation RLL 2/20 Echo >> EF 35 to 40%, severe AS, 42 mm aortic root, mild MR 2/27 PFT >> FEV1 1.26 (37%), FEV1% 34, TLC 6.17 (90%), DLCO 26%, no BD 3/06 Echo >> normal LV/RV systolic fx 3/06 Abd u/s >> GB wall thickening w/o gallstones 3/06 CT abd/pelvis >> GB sludge, non obstructive renal stones b/l, diverticulosis 3/06 CT chest >> b/l effusions, centrilobular and paraseptal emphysema, compressive ATX  EVENTS: 2/18 Admit, cardiology consulted 2/19 To ICU, VDRF 2/21 Rt/Lt heart cath, TCTS consulted 2/26 PAF 3/02 CABG, AVR, replacement of ascending thoracic aortic aneurysm 3/03  Transfuse PRBC 3/06 resp failure, lactic acidosis, ARF, shock liver, ETT 3/08 Off pressors  LINES/TUBES: 2/19 ETT >> 2/19 (self extubated) 3/02 ETT >> 3/03 3/02 Rt radial a line >>3/04 3/02 Rt IJ introducer >>3/04 3/06 ETT >> 3/07 3/06 Lt [place] CVL 3/06 >> 3/06 Lt femoral CVL >> 3/08  DISCUSSION: 65 yo male former smoker presented with fever, cough, dyspnea, chills from pneumonia.  He was found to have troponin elevation.  He has hx of COPD/emphysema.  ASSESSMENT/PLAN:  PULMONARY A: Acute on chronic hypoxic/hypercapnic respiratory failure. COPD/emphysema with exacerbation. B/l pleural effusions. P: Oxygen to keep SpO2 90 to 95% Continue BiPAP prn F/u CXR Lasix per TCTS >> negative fluid balance as tolerated Would probably benefit from thoracentesis >> defer to TCTS Continue brovana, pulmicort, spiriva Prn albuterol  CARDIOLOGY A: NSTEMI. Severe AS and severe CAD s/p CABG and AVR 3/02. Acute on chronic systolic/diastolic CHF. New shock 3/06 >> likely from sepsis. PAF >> sinus rhythm now. HLD. P: Continue lopressor Coumadin per TCTS  RENAL A: ARF likely from ATN >> improving. Hyperkalemia >> resolved. P: Monitor renal fx, urine outpt, electrolytes  GASTROENTEROLOGY A: Nutrition. Shock liver >> LFTs trending down. P: Advance diet per TCTS F/u LFTs  HEMATOLOGY A: Anemia of critical illness. Thrombocytopenia. P: F/u CBC  INFECTION A: CAP >> completed Abx 2/24. Septic shock 3/06 >> possible sources are line vs respiratory vs gallbladder >> improving. P: Day 5/7 of vancomycin, imipenem  ENDOCRINE A: Hyperglycemia. P: SSI  NEUROLOGY A: Post-op pain control. Deconditioning. P: Prn fentanyl Mobilize as able  CC time 31 minutes.  Coralyn Helling, MD [facility] Pulmonary/Critical Care 01/15/2016, 9:57 AM Pager:  [phone] After 3pm call: [phone]

## 2016-01-15 NOTE — Progress Notes (Signed)
Order received for thoracentesis on the right side for 01/15/2016 and left side on 01/16/2016.  Ultrasound called to facilitate procedure and informed of need for procedure to be done at bedside.  Per Ultrasound representative, pt will be done as schedule allows.  Will continue to monitor pt closely.

## 2016-01-15 NOTE — Progress Notes (Signed)
Patient ID: Craig FeinsteinRobert S Jenniges, male   DOB: 11/15/1950, 65 y.o.   MRN: [id number] EVENING ROUNDS NOTE :     [address]       [place] [id number]             [phone]                 8 Days Post-Op Procedure(s) (LRB): CORONARY ARTERY BYPASS GRAFTING (CABG), ON PUMP, TIMES FOUR, USING LEFT INTERNAL MAMMARY ARTERY, BILATERAL GREATER SAPHENOUS VEINS HARVESTED ENDOSCOPICALLY (N/A) TRANSESOPHAGEAL ECHOCARDIOGRAM (TEE) (N/A) CIRC ARREST AND REPLACEMENT OF ASCENDING THORACIC  ANEURYSM (N/A)  Total Length of Stay:  LOS: 20 days  BP 156/94 mmHg  Pulse 103  Temp(Src) 97.9 F (36.6 C) (Oral)  Resp 21  Ht 5\' 9"  (1.753 m)  Wt 171 lb 15.3 oz (78 kg)  BMI 25.38 kg/m2  SpO2 100%  .Intake/Output      03/10 0701 - 03/11 0700   P.O.    I.V. (mL/kg) 130 (1.7)   IV Piggyback 300   Total Intake(mL/kg) 430 (5.5)   Urine (mL/kg/hr) 2275 (2.1)   Stool    Total Output 2275   Net -1845         . dextrose 5 % and 0.45% NaCl 10 mL/hr at 01/15/16 2000     Lab Results  Component Value Date   WBC 15.2* 01/15/2016   HGB 9.9* 01/15/2016   HCT 31.3* 01/15/2016   PLT 69* 01/15/2016   GLUCOSE 118* 01/15/2016   CHOL 157 12/27/2015   TRIG 48 12/27/2015   HDL 30* 12/27/2015   LDLCALC 117* 12/27/2015   ALT 1876* 01/15/2016   AST 463* 01/15/2016   NA 141 01/15/2016   K 3.7 01/15/2016   CL 96* 01/15/2016   CREATININE 1.65* 01/15/2016   BUN 66* 01/15/2016   CO2 32 01/15/2016   TSH 2.395 01/04/2016   INR 1.56* 01/15/2016   HGBA1C  05/28/2009    5.7 (NOTE) The ADA recommends the following therapeutic goal for glycemic control related to Hgb A1c measurement: Goal of therapy: <6.5 Hgb A1c  Reference: American Diabetes Association: Clinical Practice Recommendations 2010, Diabetes Care, 2010, 33: (Suppl  1).   Off bipap currently, responded well to lasix, thoracentesis not done today will have done tomorrow  Delight OvensEdward B Jabreel Chimento MD  Beeper [phone] Office [phone] 01/15/2016 8:41  PM

## 2016-01-16 ENCOUNTER — Inpatient Hospital Stay (HOSPITAL_COMMUNITY): Payer: Medicaid Other

## 2016-01-16 LAB — BASIC METABOLIC PANEL
Anion gap: 13 (ref 5–15)
BUN: 54 mg/dL — ABNORMAL HIGH (ref 6–20)
CO2: 35 mmol/L — ABNORMAL HIGH (ref 22–32)
Calcium: 8.5 mg/dL — ABNORMAL LOW (ref 8.9–10.3)
Chloride: 96 mmol/L — ABNORMAL LOW (ref 101–111)
Creatinine, Ser: 1.45 mg/dL — ABNORMAL HIGH (ref 0.61–1.24)
GFR calc Af Amer: 57 mL/min — ABNORMAL LOW (ref >60)
GFR calc non Af Amer: 49 mL/min — ABNORMAL LOW (ref >60)
Glucose, Bld: 118 mg/dL — ABNORMAL HIGH (ref 65–99)
Potassium: 3.5 mmol/L (ref 3.5–5.1)
Sodium: 144 mmol/L (ref 135–145)

## 2016-01-16 LAB — GRAM STAIN

## 2016-01-16 LAB — CBC
HCT: 34.3 % — ABNORMAL LOW (ref 39.0–52.0)
Hemoglobin: 10.6 g/dL — ABNORMAL LOW (ref 13.0–17.0)
MCH: 29 pg (ref 26.0–34.0)
MCHC: 30.9 g/dL (ref 30.0–36.0)
MCV: 94 fL (ref 78.0–100.0)
Platelets: 67 10*3/uL — ABNORMAL LOW (ref 150–400)
RBC: 3.65 MIL/uL — ABNORMAL LOW (ref 4.22–5.81)
RDW: 16.8 % — ABNORMAL HIGH (ref 11.5–15.5)
WBC: 16 10*3/uL — ABNORMAL HIGH (ref 4.0–10.5)

## 2016-01-16 LAB — CULTURE, BLOOD (ROUTINE X 2)
Culture: NO GROWTH
Culture: NO GROWTH

## 2016-01-16 LAB — GLUCOSE, CAPILLARY
GLUCOSE-CAPILLARY: 125 mg/dL — AB (ref 65–99)
GLUCOSE-CAPILLARY: 101 mg/dL — AB (ref 65–99)
GLUCOSE-CAPILLARY: 117 mg/dL — AB (ref 65–99)
Glucose-Capillary: 105 mg/dL — ABNORMAL HIGH (ref 65–99)
Glucose-Capillary: 119 mg/dL — ABNORMAL HIGH (ref 65–99)
Glucose-Capillary: 150 mg/dL — ABNORMAL HIGH (ref 65–99)

## 2016-01-16 LAB — PROTIME-INR
INR: 1.43 (ref 0.00–1.49)
Prothrombin Time: 17.5 seconds — ABNORMAL HIGH (ref 11.6–15.2)

## 2016-01-16 MED ORDER — LIDOCAINE HCL (PF) 1 % IJ SOLN
INTRAMUSCULAR | Status: AC
  Filled 2016-01-16: qty 10

## 2016-01-16 MED ORDER — FENTANYL CITRATE (PF) 100 MCG/2ML IJ SOLN
25.0000 ug | INTRAMUSCULAR | Status: DC | PRN
  Administered 2016-01-16 – 2016-01-18 (×7): 50 ug via INTRAVENOUS
  Administered 2016-01-18 (×2): 75 ug via INTRAVENOUS
  Filled 2016-01-16 (×9): qty 2

## 2016-01-16 NOTE — Progress Notes (Addendum)
TCTS DAILY ICU PROGRESS NOTE                   [address]            [place] [id number]          [phone]   9 Days Post-Op Procedure(s) (LRB): CORONARY ARTERY BYPASS GRAFTING (CABG), ON PUMP, TIMES FOUR, USING LEFT INTERNAL MAMMARY ARTERY, BILATERAL GREATER SAPHENOUS VEINS HARVESTED ENDOSCOPICALLY (N/A) TRANSESOPHAGEAL ECHOCARDIOGRAM (TEE) (N/A) CIRC ARREST AND REPLACEMENT OF ASCENDING THORACIC  ANEURYSM (N/A)  Total Length of Stay:  LOS: 21 days   Subjective: Feels like breathing is much better after thoracentesis- 1400 ml  Objective: Vital signs in last 24 hours: Temp:  [96.8 F (36 C)-97.9 F (36.6 C)] 96.8 F (36 C) (03/11 0728) Pulse Rate:  [82-117] 99 (03/11 1120) Cardiac Rhythm:  [-] Atrial fibrillation (03/11 1100) Resp:  [13-30] 20 (03/11 1120) BP: (102-156)/(70-125) 129/86 mmHg (03/11 1120) SpO2:  [94 %-100 %] 99 % (03/11 1120) FiO2 (%):  [40 %] 40 % (03/11 1100) Weight:  [167 lb 5.3 oz (75.9 kg)] 167 lb 5.3 oz (75.9 kg) (03/11 0600)  Filed Weights   01/14/16 0500 01/15/16 0600 01/16/16 0600  Weight: 174 lb 9.7 oz (79.2 kg) 171 lb 15.3 oz (78 kg) 167 lb 5.3 oz (75.9 kg)    Weight change: -4 lb 10.1 oz (-2.1 kg)   Hemodynamic parameters for last 24 hours: CVP:  [8 mmHg-10 mmHg] 8 mmHg  Intake/Output from previous day: 03/10 0701 - 03/11 0700 In: 740 [I.V.:240; IV Piggyback:500] Out: 3310 [Urine:3310]  Intake/Output this shift: Total I/O In: 240 [I.V.:40; IV Piggyback:200] Out: 275 [Urine:275]  Current Meds: Scheduled Meds: . antiseptic oral rinse  7 mL Mouth Rinse BID  . arformoterol  15 mcg Nebulization BID  . budesonide (PULMICORT) nebulizer solution  0.5 mg Nebulization BID  . famotidine  20 mg Oral Daily  . feeding supplement (ENSURE ENLIVE)  237 mL Oral BID BM  . imipenem-cilastatin  500 mg Intravenous 3 times per day  . insulin aspart  0-24 Units Subcutaneous 6 times per day  . lidocaine (PF)      . metoprolol tartrate   25 mg Oral BID  . sodium chloride flush  10-40 mL Intracatheter Q12H  . sodium chloride flush  3 mL Intravenous Q12H  . tiotropium  18 mcg Inhalation Daily  . vancomycin  1,000 mg Intravenous Q24H  . Warfarin - Physician Dosing Inpatient   Does not apply q1800   Continuous Infusions: . dextrose 5 % and 0.45% NaCl 10 mL/hr at 01/16/16 0600   PRN Meds:.fentaNYL (SUBLIMAZE) injection, hydrALAZINE, ondansetron (ZOFRAN) IV, sodium chloride flush, sodium chloride flush  General appearance: alert, cooperative, fatigued and no distress Heart: irregularly irregular rhythm Lungs: dim in lower fields Abdomen: abnormal findings:  soft and non-tender Extremities: + LE edema Wound: incis healing well  Lab Results: CBC: Recent Labs  01/15/16 0410 01/16/16 0410  WBC 15.2* 16.0*  HGB 9.9* 10.6*  HCT 31.3* 34.3*  PLT 69* 67*   BMET:  Recent Labs  01/15/16 0410 01/16/16 0410  NA 141 144  K 3.7 3.5  CL 96* 96*  CO2 32 35*  GLUCOSE 118* 118*  BUN 66* 54*  CREATININE 1.65* 1.45*  CALCIUM 8.2* 8.5*    PT/INR:  Recent Labs  01/16/16 0410  LABPROT 17.5*  INR 1.43   Results for orders placed or performed during the hospital encounter of 12/26/15  MRSA PCR  Screening     Status: None   Collection Time: 12/27/15 12:52 AM  Result Value Ref Range Status   MRSA by PCR NEGATIVE NEGATIVE Final    Comment:        The GeneXpert MRSA Assay (FDA approved for NASAL specimens only), is one component of a comprehensive MRSA colonization surveillance program. It is not intended to diagnose MRSA infection nor to guide or monitor treatment for MRSA infections.   Culture, respiratory (NON-Expectorated)     Status: None   Collection Time: 12/27/15  2:12 PM  Result Value Ref Range Status   Specimen Description TRACHEAL ASPIRATE  Final   Special Requests NONE  Final   Gram Stain   Final    FEW WBC PRESENT,BOTH PMN AND MONONUCLEAR NO SQUAMOUS EPITHELIAL CELLS SEEN NO ORGANISMS  SEEN Performed at [facility]    Culture   Final    NORMAL OROPHARYNGEAL FLORA Performed at [facility]    Report Status 12/29/2015 FINAL  Final  Respiratory virus panel     Status: None   Collection Time: 12/27/15  5:27 PM  Result Value Ref Range Status   Source - RVPAN NASAL SWAB  Corrected   Respiratory Syncytial Virus A Negative Negative Final   Respiratory Syncytial Virus B Negative Negative Final   Influenza A Negative Negative Final   Influenza B Negative Negative Final   Parainfluenza 1 Negative Negative Final   Parainfluenza 2 Negative Negative Final   Parainfluenza 3 Negative Negative Final   Metapneumovirus Negative Negative Final   Rhinovirus Negative Negative Final   Adenovirus Negative Negative Final    Comment: (NOTE) Performed At: [facility] [address] [place], [place] [id number] Mila Homer MD WU:[id number]   Surgical pcr screen     Status: None   Collection Time: 01/07/16  5:35 AM  Result Value Ref Range Status   MRSA, PCR NEGATIVE NEGATIVE Final   Staphylococcus aureus NEGATIVE NEGATIVE Final    Comment:        The Xpert SA Assay (FDA approved for NASAL specimens in patients over 50 years of age), is one component of a comprehensive surveillance program.  Test performance has been validated by [facility] for patients greater than or equal to 36 year old. It is not intended to diagnose infection nor to guide or monitor treatment.   Culture, blood (Routine X 2) w Reflex to ID Panel     Status: None (Preliminary result)   Collection Time: 01/11/16  8:25 AM  Result Value Ref Range Status   Specimen Description BLOOD LEFT ANTECUBITAL  Final   Special Requests IN PEDIATRIC BOTTLE 3CC  Final   Culture NO GROWTH 4 DAYS  Final   Report Status PENDING  Incomplete  Culture, blood (Routine X 2) w Reflex to ID Panel     Status: None (Preliminary result)   Collection Time: 01/11/16  8:32 AM  Result Value Ref Range Status    Specimen Description BLOOD RIGHT ANTECUBITAL  Final   Special Requests IN PEDIATRIC BOTTLE 3CC  Final   Culture NO GROWTH 4 DAYS  Final   Report Status PENDING  Incomplete  Gram stain     Status: None   Collection Time: 01/16/16 10:01 AM  Result Value Ref Range Status   Specimen Description FLUID RIGHT PLEURAL  Final   Special Requests NONE  Final   Gram Stain   Final    FEW WBC PRESENT, PREDOMINANTLY PMN NO ORGANISMS SEEN  Report Status 01/16/2016 FINAL  Final    Radiology: Dg Chest Port 1 View  01/16/2016  CLINICAL DATA:  Shortness of breath EXAM: PORTABLE CHEST 1 VIEW COMPARISON:  01/15/2016 FINDINGS: Cardiomegaly with pulmonary vascular congestion and suspected mild interstitial edema. Moderate bilateral pleural effusions, right greater than left. Associated bilateral lower lobe opacities, likely atelectasis. Prosthetic valve.  Median sternotomy. Left subclavian venous catheter terminates in the lower SVC. Old bilateral rib fracture deformities. IMPRESSION: Cardiomegaly with mild interstitial edema. Moderate bilateral pleural effusions, right greater than left. Associated bilateral lower lobe opacities, likely atelectasis. Electronically Signed   By: Charline Bills M.D.   On: 01/16/2016 09:27   US Thoracentesis Asp Pleural Space W/img Guide  01/16/2016  INDICATION: 65 year old male with bilateral layering pleural effusions and dyspnea requiring BiPAP EXAM: ULTRASOUND GUIDED RIGHT THORACENTESIS MEDICATIONS: None. COMPLICATIONS: None immediate. PROCEDURE: An ultrasound guided thoracentesis was thoroughly discussed with the patient and questions answered. The benefits, risks, alternatives and complications were also discussed. The patient understands and wishes to proceed with the procedure. Written consent was obtained. Ultrasound was performed to localize and mark an adequate pocket of fluid in the right chest. The area was then prepped and draped in the normal sterile fashion. 1%  Lidocaine was used for local anesthesia. Under ultrasound guidance a 6 Fr Safe-T-Centesis catheter was introduced. Thoracentesis was performed. The catheter was removed and a dressing applied. FINDINGS: A total of approximately 1400 mL of bloody pleural fluid was removed. Samples were sent to the laboratory as requested by the clinical team. IMPRESSION: Successful ultrasound guided right thoracentesis yielding 1.4 L of bloody pleural fluid. Electronically Signed   By: Malachy Moan M.D.   On: 01/16/2016 14:00     Assessment/Plan: S/P Procedure(s) (LRB): CORONARY ARTERY BYPASS GRAFTING (CABG), ON PUMP, TIMES FOUR, USING LEFT INTERNAL MAMMARY ARTERY, BILATERAL GREATER SAPHENOUS VEINS HARVESTED ENDOSCOPICALLY (N/A) TRANSESOPHAGEAL ECHOCARDIOGRAM (TEE) (N/A) CIRC ARREST AND REPLACEMENT OF ASCENDING THORACIC  ANEURYSM (N/A)  1 improved after thoracentesis- plan to do other side tomorrow 2 micro - no growth. Influenza negative 3 afebrile , leukocytosis is stable- on  Vanc/imipenam 4 creat conts to improve 5 cont pulm rx for COPD   GOLD,WAYNE E 01/16/2016 2:30 PM  thoracentesis on right today , will do left tomorrow  I have seen and examined Craig [name] and agree with the above assessment  and plan.  Delight Ovens MD Beeper [phone] Office [phone] 01/16/2016 3:24 PM

## 2016-01-16 NOTE — Progress Notes (Signed)
Patient ID: Craig FeinsteinRobert S Fewell, male   DOB: 04/16/1951, 65 y.o.   MRN: [id number] EVENING ROUNDS NOTE :     [address]       [place] [id number]             [phone]                 9 Days Post-Op Procedure(s) (LRB): CORONARY ARTERY BYPASS GRAFTING (CABG), ON PUMP, TIMES FOUR, USING LEFT INTERNAL MAMMARY ARTERY, BILATERAL GREATER SAPHENOUS VEINS HARVESTED ENDOSCOPICALLY (N/A) TRANSESOPHAGEAL ECHOCARDIOGRAM (TEE) (N/A) CIRC ARREST AND REPLACEMENT OF ASCENDING THORACIC  ANEURYSM (N/A)  Total Length of Stay:  LOS: 21 days  BP 118/78 mmHg  Pulse 99  Temp(Src) 97.6 F (36.4 C) (Oral)  Resp 18  Ht 5\' 9"  (1.753 m)  Wt 167 lb 5.3 oz (75.9 kg)  BMI 24.70 kg/m2  SpO2 95%  .Intake/Output      03/10 0701 - 03/11 0700 03/11 0701 - 03/12 0700   P.O.  1000   I.V. (mL/kg) 240 (3.2) 110 (1.4)   IV Piggyback 500 300   Total Intake(mL/kg) 740 (9.7) 1410 (18.6)   Urine (mL/kg/hr) 3310 (1.8) 775 (0.9)   Stool 0 (0)    Total Output 3310 775   Net -2570 +635        Stool Occurrence 2 x      . dextrose 5 % and 0.45% NaCl 10 mL/hr at 01/16/16 0600     Lab Results  Component Value Date   WBC 16.0* 01/16/2016   HGB 10.6* 01/16/2016   HCT 34.3* 01/16/2016   PLT 67* 01/16/2016   GLUCOSE 118* 01/16/2016   CHOL 157 12/27/2015   TRIG 48 12/27/2015   HDL 30* 12/27/2015   LDLCALC 117* 12/27/2015   ALT 1876* 01/15/2016   AST 463* 01/15/2016   NA 144 01/16/2016   K 3.5 01/16/2016   CL 96* 01/16/2016   CREATININE 1.45* 01/16/2016   BUN 54* 01/16/2016   CO2 35* 01/16/2016   TSH 2.395 01/04/2016   INR 1.43 01/16/2016   HGBA1C  05/28/2009    5.7 (NOTE) The ADA recommends the following therapeutic goal for glycemic control related to Hgb A1c measurement: Goal of therapy: <6.5 Hgb A1c  Reference: American Diabetes Association: Clinical Practice Recommendations 2010, Diabetes Care, 2010, 33: (Suppl  1).   Stable after thoracentesis   Delight OvensEdward B Madgeline Rayo MD  Beeper  [phone] Office [phone] 01/16/2016 6:45 PM

## 2016-01-16 NOTE — Progress Notes (Signed)
   01/16/16 1428  Clinical Encounter Type  Visited With Patient;Health care provider  Visit Type Initial;Spiritual support;Social support  Referral From Family  Spiritual Encounters  Spiritual Needs Grief support  Stress Factors  Patient Stress Factors Loss   Chaplain responded to a family request to visit with the patient and offer support. Patient's mother was buried today after years of struggling with Alzheimer's Disease. Chaplain offered support. Patient indicated that he wanted to rest, so our visit was brief. Chaplain support available as needed.   Alda PonderAdam M Santo Zahradnik, Chaplain 01/16/2016 2:30 PM

## 2016-01-17 ENCOUNTER — Inpatient Hospital Stay (HOSPITAL_COMMUNITY): Payer: Medicaid Other

## 2016-01-17 LAB — GLUCOSE, CAPILLARY
GLUCOSE-CAPILLARY: 114 mg/dL — AB (ref 65–99)
GLUCOSE-CAPILLARY: 130 mg/dL — AB (ref 65–99)
GLUCOSE-CAPILLARY: 65 mg/dL (ref 65–99)
GLUCOSE-CAPILLARY: 128 mg/dL — AB (ref 65–99)
GLUCOSE-CAPILLARY: 96 mg/dL (ref 65–99)
GLUCOSE-CAPILLARY: 85 mg/dL (ref 65–99)
Glucose-Capillary: 113 mg/dL — ABNORMAL HIGH (ref 65–99)

## 2016-01-17 LAB — CBC
HCT: 34.4 % — ABNORMAL LOW (ref 39.0–52.0)
Hemoglobin: 10.6 g/dL — ABNORMAL LOW (ref 13.0–17.0)
MCH: 29.4 pg (ref 26.0–34.0)
MCHC: 30.8 g/dL (ref 30.0–36.0)
MCV: 95.6 fL (ref 78.0–100.0)
Platelets: 58 10*3/uL — ABNORMAL LOW (ref 150–400)
RBC: 3.6 MIL/uL — ABNORMAL LOW (ref 4.22–5.81)
RDW: 18 % — ABNORMAL HIGH (ref 11.5–15.5)
WBC: 14.9 10*3/uL — ABNORMAL HIGH (ref 4.0–10.5)

## 2016-01-17 LAB — MISC LABCORP TEST (SEND OUT): Labcorp test code: 88062

## 2016-01-17 LAB — BASIC METABOLIC PANEL
Anion gap: 11 (ref 5–15)
BUN: 42 mg/dL — ABNORMAL HIGH (ref 6–20)
CO2: 34 mmol/L — ABNORMAL HIGH (ref 22–32)
Calcium: 8.2 mg/dL — ABNORMAL LOW (ref 8.9–10.3)
Chloride: 92 mmol/L — ABNORMAL LOW (ref 101–111)
Creatinine, Ser: 1.21 mg/dL (ref 0.61–1.24)
GFR calc Af Amer: >60 mL/min (ref >60)
GFR calc non Af Amer: >60 mL/min (ref >60)
Glucose, Bld: 90 mg/dL (ref 65–99)
Potassium: 3.4 mmol/L — ABNORMAL LOW (ref 3.5–5.1)
Sodium: 137 mmol/L (ref 135–145)

## 2016-01-17 LAB — PROTIME-INR
INR: 1.51 — ABNORMAL HIGH (ref 0.00–1.49)
Prothrombin Time: 18.3 seconds — ABNORMAL HIGH (ref 11.6–15.2)

## 2016-01-17 LAB — GRAM STAIN

## 2016-01-17 MED ORDER — LIDOCAINE HCL (PF) 1 % IJ SOLN
INTRAMUSCULAR | Status: AC
  Filled 2016-01-17: qty 10

## 2016-01-17 NOTE — Progress Notes (Signed)
Patient ID: Craig [name], male   DOB: 1951-03-09, 65 y.o.   MRN: [id number] TCTS DAILY ICU PROGRESS NOTE                   [address]            [place] [address]          [phone]   10 Days Post-Op Procedure(s) (LRB): CORONARY ARTERY BYPASS GRAFTING (CABG), ON PUMP, TIMES FOUR, USING LEFT INTERNAL MAMMARY ARTERY, BILATERAL GREATER SAPHENOUS VEINS HARVESTED ENDOSCOPICALLY (N/A) TRANSESOPHAGEAL ECHOCARDIOGRAM (TEE) (N/A) CIRC ARREST AND REPLACEMENT OF ASCENDING THORACIC  ANEURYSM (N/A)  Total Length of Stay:  LOS: 22 days   Subjective: Breathing has improved, after bilateral thoracentesis   Objective: Vital signs in last 24 hours: Temp:  [97.4 F (36.3 C)-97.8 F (36.6 C)] 97.7 F (36.5 C) (03/12 0918) Pulse Rate:  [92-108] 98 (03/12 1000) Cardiac Rhythm:  [-] Atrial fibrillation (03/12 1000) Resp:  [12-26] 23 (03/12 1000) BP: (92-132)/(61-97) 109/76 mmHg (03/12 1000) SpO2:  [81 %-100 %] 94 % (03/12 1000) FiO2 (%):  [40 %] 40 % (03/11 2000) Weight:  [160 lb 7.9 oz (72.8 kg)] 160 lb 7.9 oz (72.8 kg) (03/12 0500)  Filed Weights   01/15/16 0600 01/16/16 0600 01/17/16 0500  Weight: 171 lb 15.3 oz (78 kg) 167 lb 5.3 oz (75.9 kg) 160 lb 7.9 oz (72.8 kg)    Weight change: -6 lb 13.4 oz (-3.1 kg)   Hemodynamic parameters for last 24 hours: CVP:  [6 mmHg-11 mmHg] 11 mmHg  Intake/Output from previous day: 03/11 0701 - 03/12 0700 In: 2463 [P.O.:1720; I.V.:243; IV Piggyback:500] Out: 1485 [Urine:1485]  Intake/Output this shift: Total I/O In: 230 [I.V.:30; IV Piggyback:200] Out: -   Current Meds: Scheduled Meds: . antiseptic oral rinse  7 mL Mouth Rinse BID  . arformoterol  15 mcg Nebulization BID  . budesonide (PULMICORT) nebulizer solution  0.5 mg Nebulization BID  . famotidine  20 mg Oral Daily  . feeding supplement (ENSURE ENLIVE)  237 mL Oral BID BM  . imipenem-cilastatin  500 mg Intravenous 3 times per day  . insulin aspart  0-24 Units  Subcutaneous 6 times per day  . lidocaine (PF)      . metoprolol tartrate  25 mg Oral BID  . sodium chloride flush  10-40 mL Intracatheter Q12H  . sodium chloride flush  3 mL Intravenous Q12H  . tiotropium  18 mcg Inhalation Daily  . vancomycin  1,000 mg Intravenous Q24H  . Warfarin - Physician Dosing Inpatient   Does not apply q1800   Continuous Infusions: . dextrose 5 % and 0.45% NaCl 10 mL/hr at 01/16/16 0600   PRN Meds:.fentaNYL (SUBLIMAZE) injection, hydrALAZINE, ondansetron (ZOFRAN) IV, sodium chloride flush, sodium chloride flush  General appearance: alert, cooperative, fatigued and no distress Heart: irregularly irregular rhythm Lungs: dim in lower fields Abdomen: abnormal findings:  soft and non-tender Extremities: + LE edema Wound: incis healing well  Lab Results: CBC:  Recent Labs  01/15/16 0410 01/16/16 0410  WBC 15.2* 16.0*  HGB 9.9* 10.6*  HCT 31.3* 34.3*  PLT 69* 67*   BMET:   Recent Labs  01/15/16 0410 01/16/16 0410  NA 141 144  K 3.7 3.5  CL 96* 96*  CO2 32 35*  GLUCOSE 118* 118*  BUN 66* 54*  CREATININE 1.65* 1.45*  CALCIUM 8.2* 8.5*    PT/INR:   Recent Labs  01/17/16 0430  LABPROT 18.3*  INR 1.51*  Results for orders placed or performed during the hospital encounter of 12/26/15  MRSA PCR Screening     Status: None   Collection Time: 12/27/15 12:52 AM  Result Value Ref Range Status   MRSA by PCR NEGATIVE NEGATIVE Final    Comment:        The GeneXpert MRSA Assay (FDA approved for NASAL specimens only), is one component of a comprehensive MRSA colonization surveillance program. It is not intended to diagnose MRSA infection nor to guide or monitor treatment for MRSA infections.   Culture, respiratory (NON-Expectorated)     Status: None   Collection Time: 12/27/15  2:12 PM  Result Value Ref Range Status   Specimen Description TRACHEAL ASPIRATE  Final   Special Requests NONE  Final   Gram Stain   Final    FEW WBC  PRESENT,BOTH PMN AND MONONUCLEAR NO SQUAMOUS EPITHELIAL CELLS SEEN NO ORGANISMS SEEN Performed at [facility]    Culture   Final    NORMAL OROPHARYNGEAL FLORA Performed at [facility]    Report Status 12/29/2015 FINAL  Final  Respiratory virus panel     Status: None   Collection Time: 12/27/15  5:27 PM  Result Value Ref Range Status   Source - RVPAN NASAL SWAB  Corrected   Respiratory Syncytial Virus A Negative Negative Final   Respiratory Syncytial Virus B Negative Negative Final   Influenza A Negative Negative Final   Influenza B Negative Negative Final   Parainfluenza 1 Negative Negative Final   Parainfluenza 2 Negative Negative Final   Parainfluenza 3 Negative Negative Final   Metapneumovirus Negative Negative Final   Rhinovirus Negative Negative Final   Adenovirus Negative Negative Final    Comment: (NOTE) Performed At: [facility] [address] [place], [place] [id number] Mila Homer MD WU:[id number]   Surgical pcr screen     Status: None   Collection Time: 01/07/16  5:35 AM  Result Value Ref Range Status   MRSA, PCR NEGATIVE NEGATIVE Final   Staphylococcus aureus NEGATIVE NEGATIVE Final    Comment:        The Xpert SA Assay (FDA approved for NASAL specimens in patients over 6 years of age), is one component of a comprehensive surveillance program.  Test performance has been validated by [facility] for patients greater than or equal to 10 year old. It is not intended to diagnose infection nor to guide or monitor treatment.   Culture, blood (Routine X 2) w Reflex to ID Panel     Status: None   Collection Time: 01/11/16  8:25 AM  Result Value Ref Range Status   Specimen Description BLOOD LEFT ANTECUBITAL  Final   Special Requests IN PEDIATRIC BOTTLE 3CC  Final   Culture NO GROWTH 5 DAYS  Final   Report Status 01/16/2016 FINAL  Final  Culture, blood (Routine X 2) w Reflex to ID Panel     Status: None   Collection Time:  01/11/16  8:32 AM  Result Value Ref Range Status   Specimen Description BLOOD RIGHT ANTECUBITAL  Final   Special Requests IN PEDIATRIC BOTTLE 3CC  Final   Culture NO GROWTH 5 DAYS  Final   Report Status 01/16/2016 FINAL  Final  Culture, body fluid-bottle     Status: None (Preliminary result)   Collection Time: 01/16/16 10:01 AM  Result Value Ref Range Status   Specimen Description FLUID RIGHT PLEURAL  Final   Special Requests NONE  Final   Culture NO  GROWTH < 24 HOURS  Final   Report Status PENDING  Incomplete  Gram stain     Status: None   Collection Time: 01/16/16 10:01 AM  Result Value Ref Range Status   Specimen Description FLUID RIGHT PLEURAL  Final   Special Requests NONE  Final   Gram Stain   Final    FEW WBC PRESENT, PREDOMINANTLY PMN NO ORGANISMS SEEN    Report Status 01/16/2016 FINAL  Final    Radiology: Dg Chest Port 1 View  01/17/2016  CLINICAL DATA:  Status post thoracentesis. EXAM: PORTABLE CHEST - 1 VIEW COMPARISON:  One-view chest x-ray 01/16/2016 FINDINGS: There is significant reduction and a right-sided pleural effusion. There is no pneumothorax. Persistent right pleural fluid and airspace disease is present. A left pleural effusion and airspace disease is unchanged. The heart is enlarged. A left subclavian line is stable. IMPRESSION: 1. Significant reduction and a right-sided pleural effusion without pneumothorax. 2. Persistent bilateral pleural effusions and airspace disease. 3. Cardiomegaly. 4. Left subclavian line is stable. Electronically Signed   By: Marin Robertshristopher  Mattern M.D.   On: 01/17/2016 10:36     Assessment/Plan: S/P Procedure(s) (LRB): CORONARY ARTERY BYPASS GRAFTING (CABG), ON PUMP, TIMES FOUR, USING LEFT INTERNAL MAMMARY ARTERY, BILATERAL GREATER SAPHENOUS VEINS HARVESTED ENDOSCOPICALLY (N/A) TRANSESOPHAGEAL ECHOCARDIOGRAM (TEE) (N/A) CIRC ARREST AND REPLACEMENT OF ASCENDING THORACIC  ANEURYSM (N/A)  1 improved after  bilaterial  Thoracentesis 2  micro - no growth. Influenza negative 3 afebrile , leukocytosis is stable- on  Vanc/imipenam 4 creat conts to improve 5 cont pulm rx for COPD 6 still with thrombocytopenia   Delight Ovensdward B Jahari Wiginton 01/17/2016 11:02 AM

## 2016-01-17 NOTE — Procedures (Signed)
  US guided Left thoracentesis PORTABLE  1.3 litters bloody fluid Sent for labs per MD  Pt tolerated well  cxr pending

## 2016-01-17 NOTE — Progress Notes (Signed)
Patient ID: Craig FeinsteinRobert S Liebman, male   DOB: 02/12/1951, 65 y.o.   MRN: [id number] EVENING ROUNDS NOTE :     [address]       [place] [id number]             [phone]                 10 Days Post-Op Procedure(s) (LRB): CORONARY ARTERY BYPASS GRAFTING (CABG), ON PUMP, TIMES FOUR, USING LEFT INTERNAL MAMMARY ARTERY, BILATERAL GREATER SAPHENOUS VEINS HARVESTED ENDOSCOPICALLY (N/A) TRANSESOPHAGEAL ECHOCARDIOGRAM (TEE) (N/A) CIRC ARREST AND REPLACEMENT OF ASCENDING THORACIC  ANEURYSM (N/A)  Total Length of Stay:  LOS: 22 days  BP 127/79 mmHg  Pulse 101  Temp(Src) 97.5 F (36.4 C) (Oral)  Resp 24  Ht 5\' 9"  (1.753 m)  Wt 160 lb 7.9 oz (72.8 kg)  BMI 23.69 kg/m2  SpO2 100%  .Intake/Output      03/12 0701 - 03/13 0700   P.O. 400   I.V. (mL/kg) 100 (1.4)   IV Piggyback 300   Total Intake(mL/kg) 800 (11)   Urine (mL/kg/hr) 420 (0.5)   Stool    Total Output 420   Net +380       Urine Occurrence 1 x     . dextrose 5 % and 0.45% NaCl Stopped (01/17/16 1700)     Lab Results  Component Value Date   WBC 14.9* 01/17/2016   HGB 10.6* 01/17/2016   HCT 34.4* 01/17/2016   PLT 58* 01/17/2016   GLUCOSE 90 01/17/2016   CHOL 157 12/27/2015   TRIG 48 12/27/2015   HDL 30* 12/27/2015   LDLCALC 117* 12/27/2015   ALT 1876* 01/15/2016   AST 463* 01/15/2016   NA 137 01/17/2016   K 3.4* 01/17/2016   CL 92* 01/17/2016   CREATININE 1.21 01/17/2016   BUN 42* 01/17/2016   CO2 34* 01/17/2016   TSH 2.395 01/04/2016   INR 1.51* 01/17/2016   HGBA1C  05/28/2009    5.7 (NOTE) The ADA recommends the following therapeutic goal for glycemic control related to Hgb A1c measurement: Goal of therapy: <6.5 Hgb A1c  Reference: American Diabetes Association: Clinical Practice Recommendations 2010, Diabetes Care, 2010, 33: (Suppl  1).   Breathing better, now on 2 l   Delight Ovensdward B Hayze Gazda MD  Beeper [phone] Office [phone] 01/17/2016 7:44 PM

## 2016-01-17 NOTE — Progress Notes (Signed)
PCCM PROGRESS NOTE  ADMISSION DATE: 12/26/2015 CONSULT DATE: 12/27/2015 REFERRING PROVIDER: [facility]  CC: Short of breath  SUBJECTIVE: Feels much better after getting thoracentesis yesterday.  VITAL SIGNS: BP 113/70 mmHg  Pulse 98  Temp(Src) 97.7 F (36.5 C) (Oral)  Resp 20  Ht  (1.753 m)  Wt 160 lb 7.9 oz (72.8 kg)  BMI 23.69 kg/m2  SpO2 95%  INTAKE/OUTPUT: I/O last 3 completed shifts: In: 2523 [P.O.:1480; I.V.:343; IV Piggyback:700] Out: 2470 [Urine:2470]  General: sitting up in bed HEENT: no stridor Cardiac: regular, 2/6 SM Chest: decreased BS at bases, no wheeze Abd: soft, non tender Ext: no edema Neuro: alert, follows commands Skin: no rashes   CBC Recent Labs     01/15/16  0410  01/16/16  0410  WBC  15.2*  16.0*  HGB  9.9*  10.6*  HCT  31.3*  34.3*  PLT  69*  67*    Coag's Recent Labs     01/15/16  0410  01/16/16  0410  01/17/16  0430  INR  1.56*  1.43  1.51*    BMET Recent Labs     01/15/16  0410  01/16/16  0410  NA  141  144  K  3.7  3.5  CL  96*  96*  CO2  32  35*  BUN  66*  54*  CREATININE  1.65*  1.45*  GLUCOSE  118*  118*   LFT's Lab Results  Component Value Date   ALT 1876* 01/15/2016   AST 463* 01/15/2016   ALKPHOS 103 01/15/2016   BILITOT 3.4* 01/15/2016    Electrolytes Recent Labs     01/15/16  0410  01/16/16  0410  CALCIUM  8.2*  8.5*    ABG No results for input(s): PHART, PCO2ART, PO2ART in the last 72 hours.  Glucose Recent Labs     01/16/16  0724  01/16/16  1328  01/16/16  1740  01/16/16  1919  01/16/16  2305  01/17/16  0425  GLUCAP  105*  117*  119*  150*  113*  114*    Imaging Dg Chest Port 1 View  01/16/2016  CLINICAL DATA:  Shortness of breath EXAM: PORTABLE CHEST 1 VIEW COMPARISON:  01/15/2016 FINDINGS: Cardiomegaly with pulmonary vascular congestion and suspected mild interstitial edema. Moderate bilateral pleural effusions, right greater than left. Associated bilateral lower lobe  opacities, likely atelectasis. Prosthetic valve.  Median sternotomy. Left subclavian venous catheter terminates in the lower SVC. Old bilateral rib fracture deformities. IMPRESSION: Cardiomegaly with mild interstitial edema. Moderate bilateral pleural effusions, right greater than left. Associated bilateral lower lobe opacities, likely atelectasis. Electronically Signed   By: Charline Bills M.D.   On: 01/16/2016 09:27   US Thoracentesis Asp Pleural Space W/img Guide  01/16/2016  INDICATION: 65 year old male with bilateral layering pleural effusions and dyspnea requiring BiPAP EXAM: ULTRASOUND GUIDED RIGHT THORACENTESIS MEDICATIONS: None. COMPLICATIONS: None immediate. PROCEDURE: An ultrasound guided thoracentesis was thoroughly discussed with the patient and questions answered. The benefits, risks, alternatives and complications were also discussed. The patient understands and wishes to proceed with the procedure. Written consent was obtained. Ultrasound was performed to localize and mark an adequate pocket of fluid in the right chest. The area was then prepped and draped in the normal sterile fashion. 1% Lidocaine was used for local anesthesia. Under ultrasound guidance a 6 Fr Safe-T-Centesis catheter was introduced. Thoracentesis was performed. The catheter was removed and a dressing applied. FINDINGS: A total of approximately 1400 mL of  bloody pleural fluid was removed. Samples were sent to the laboratory as requested by the clinical team. IMPRESSION: Successful ultrasound guided right thoracentesis yielding 1.4 L of bloody pleural fluid. Electronically Signed   By: Malachy MoanHeath  McCullough M.D.   On: 01/16/2016 14:00   CULTURES: 2/19 Respiratory viral panel >> negative 2/19 Sputum >> oral flora 2/19 Pneumococcal ag >> negative 3/06 Blood >>  ANTIBIOTICS: 2/18 Zithromax >> 2/21 2/18 Rocephin >> 2/24 3/06 Imipenem >> 3/06 Vancomycin >>  STUDIES: 2/18 CT chest >> emphysema, consolidation RLL 2/20  Echo >> EF 35 to 40%, severe AS, 42 mm aortic root, mild MR 2/27 PFT >> FEV1 1.26 (37%), FEV1% 34, TLC 6.17 (90%), DLCO 26%, no BD 3/06 Echo >> normal LV/RV systolic fx 3/06 Abd u/s >> GB wall thickening w/o gallstones 3/06 CT abd/pelvis >> GB sludge, non obstructive renal stones b/l, diverticulosis 3/06 CT chest >> b/l effusions, centrilobular and paraseptal emphysema, compressive ATX  EVENTS: 2/18 Admit, cardiology consulted 2/19 To ICU, VDRF 2/21 Rt/Lt heart cath, TCTS consulted 2/26 PAF 3/02 CABG, AVR, replacement of ascending thoracic aortic aneurysm 3/03 Transfuse PRBC 3/06 resp failure, lactic acidosis, ARF, shock liver, ETT 3/08 Off pressors 3/11 Rt thoracentesis >> 1400 ml fluid  LINES/TUBES: 2/19 ETT >> 2/19 (self extubated) 3/02 ETT >> 3/03 3/02 Rt radial a line >>3/04 3/02 Rt IJ introducer >>3/04 3/06 ETT >> 3/07 3/06 Lt [place] CVL 3/06 >> 3/06 Lt femoral CVL >> 3/08  DISCUSSION: 65 yo male former smoker presented with fever, cough, dyspnea, chills from pneumonia.  He was found to have troponin elevation.  He has hx of COPD/emphysema.  ASSESSMENT/PLAN:  PULMONARY A: Acute on chronic hypoxic/hypercapnic respiratory failure >> much improved after Rt thoracentesis 3/11. COPD/emphysema with exacerbation. B/l pleural effusions. P: Oxygen to keep SpO2 90 to 95% BiPAP prn F/u CXR Lasix per TCTS >> negative fluid balance as tolerated Lt thoracentesis by IR planned for 3/12 Continue brovana, pulmicort, spiriva Prn albuterol  CARDIOLOGY A: NSTEMI. Severe AS and severe CAD s/p CABG and AVR 3/02. Acute on chronic systolic/diastolic CHF. New shock 3/06 >> likely from sepsis. PAF >> sinus rhythm now. HLD. P: Continue lopressor Coumadin on hold for thoracentesis per TCTS  RENAL A: ARF likely from ATN >> improving. Hyperkalemia >> resolved. P: Monitor renal fx, urine outpt, electrolytes  GASTROENTEROLOGY A: Nutrition. Shock liver >> LFTs trending  down. P: Advance diet per TCTS F/u LFTs  HEMATOLOGY A: Anemia of critical illness. Thrombocytopenia. P: F/u CBC  INFECTION A: CAP >> completed Abx 2/24. Septic shock 3/06 >> possible sources are line vs respiratory vs gallbladder >> improving. P: Day 7/7 of vancomycin, imipenem >> likely can d/c abx affter doses on 3/12  ENDOCRINE A: Hyperglycemia. P: SSI  NEUROLOGY A: Post-op pain control. Deconditioning. P: Prn fentanyl Mobilize as able   Coralyn HellingVineet Earnestine Shipp, MD [facility] 01/17/2016, 7:03 AM Pager:  [phone] After 3pm call: [phone]

## 2016-01-18 ENCOUNTER — Inpatient Hospital Stay (HOSPITAL_COMMUNITY): Payer: Medicaid Other

## 2016-01-18 LAB — COMPREHENSIVE METABOLIC PANEL
ALT: 462 U/L — ABNORMAL HIGH (ref 17–63)
ANION GAP: 8 (ref 5–15)
AST: 95 U/L — ABNORMAL HIGH (ref 15–41)
Albumin: 2.5 g/dL — ABNORMAL LOW (ref 3.5–5.0)
Alkaline Phosphatase: 93 U/L (ref 38–126)
BILIRUBIN TOTAL: 3.7 mg/dL — AB (ref 0.3–1.2)
BUN: 40 mg/dL — ABNORMAL HIGH (ref 6–20)
CO2: 37 mmol/L — ABNORMAL HIGH (ref 22–32)
Calcium: 8.2 mg/dL — ABNORMAL LOW (ref 8.9–10.3)
Chloride: 94 mmol/L — ABNORMAL LOW (ref 101–111)
Creatinine, Ser: 1.19 mg/dL (ref 0.61–1.24)
Glucose, Bld: 102 mg/dL — ABNORMAL HIGH (ref 65–99)
POTASSIUM: 3.8 mmol/L (ref 3.5–5.1)
Sodium: 139 mmol/L (ref 135–145)
TOTAL PROTEIN: 5 g/dL — AB (ref 6.5–8.1)

## 2016-01-18 LAB — CBC
HEMATOCRIT: 33.9 % — AB (ref 39.0–52.0)
HEMOGLOBIN: 10.4 g/dL — AB (ref 13.0–17.0)
MCH: 29.6 pg (ref 26.0–34.0)
MCHC: 30.7 g/dL (ref 30.0–36.0)
MCV: 96.6 fL (ref 78.0–100.0)
Platelets: 65 10*3/uL — ABNORMAL LOW (ref 150–400)
RBC: 3.51 MIL/uL — AB (ref 4.22–5.81)
RDW: 18.8 % — AB (ref 11.5–15.5)
WBC: 15.7 10*3/uL — AB (ref 4.0–10.5)

## 2016-01-18 LAB — GLUCOSE, CAPILLARY
GLUCOSE-CAPILLARY: 129 mg/dL — AB (ref 65–99)
GLUCOSE-CAPILLARY: 99 mg/dL (ref 65–99)
GLUCOSE-CAPILLARY: 94 mg/dL (ref 65–99)
GLUCOSE-CAPILLARY: 104 mg/dL — AB (ref 65–99)
Glucose-Capillary: 106 mg/dL — ABNORMAL HIGH (ref 65–99)
Glucose-Capillary: 125 mg/dL — ABNORMAL HIGH (ref 65–99)

## 2016-01-18 LAB — PROTIME-INR
INR: 1.55 — AB (ref 0.00–1.49)
PROTHROMBIN TIME: 18.6 s — AB (ref 11.6–15.2)

## 2016-01-18 LAB — VANCOMYCIN, TROUGH: Vancomycin Tr: 16 ug/mL (ref 10.0–20.0)

## 2016-01-18 MED ORDER — ALPRAZOLAM 0.5 MG PO TABS
0.5000 mg | ORAL_TABLET | Freq: Every evening | ORAL | Status: DC | PRN
  Administered 2016-01-18 – 2016-01-19 (×2): 0.5 mg via ORAL
  Filled 2016-01-18 (×2): qty 1

## 2016-01-18 MED ORDER — TRAMADOL HCL 50 MG PO TABS
50.0000 mg | ORAL_TABLET | Freq: Four times a day (QID) | ORAL | Status: DC | PRN
  Administered 2016-01-18 – 2016-01-28 (×17): 50 mg via ORAL
  Filled 2016-01-18 (×17): qty 1

## 2016-01-18 MED ORDER — WARFARIN SODIUM 2.5 MG PO TABS
2.5000 mg | ORAL_TABLET | Freq: Once | ORAL | Status: AC
  Administered 2016-01-18: 2.5 mg via ORAL
  Filled 2016-01-18: qty 1

## 2016-01-18 MED ORDER — FUROSEMIDE 10 MG/ML IJ SOLN
INTRAMUSCULAR | Status: AC
  Filled 2016-01-18: qty 4

## 2016-01-18 MED ORDER — WARFARIN - PHYSICIAN DOSING INPATIENT
Freq: Every day | Status: DC
  Administered 2016-01-22 – 2016-01-29 (×3)

## 2016-01-18 MED ORDER — FUROSEMIDE 10 MG/ML IJ SOLN
40.0000 mg | Freq: Three times a day (TID) | INTRAMUSCULAR | Status: AC
  Administered 2016-01-18 (×2): 40 mg via INTRAVENOUS
  Filled 2016-01-18: qty 4

## 2016-01-18 MED ORDER — POTASSIUM CHLORIDE CRYS ER 20 MEQ PO TBCR
20.0000 meq | EXTENDED_RELEASE_TABLET | Freq: Two times a day (BID) | ORAL | Status: DC
  Administered 2016-01-18 (×2): 20 meq via ORAL
  Filled 2016-01-18 (×2): qty 1

## 2016-01-18 NOTE — Progress Notes (Signed)
One of the pacing wires broke while transferring pt to chair. Pacer had been on DDD at 60bpm for backup, but hasn't been needed in past week, so pacer turned off and oncoming RN notified of event. Pt assessed, no change in condition. Will continue to monitor.   Peyton Bottomsachel R Skyrah Krupp, RN 01/18/2016

## 2016-01-18 NOTE — Progress Notes (Signed)
11 Days Post-Op Procedure(s) (LRB): CORONARY ARTERY BYPASS GRAFTING (CABG), ON PUMP, TIMES FOUR, USING LEFT INTERNAL MAMMARY ARTERY, BILATERAL GREATER SAPHENOUS VEINS HARVESTED ENDOSCOPICALLY (N/A) TRANSESOPHAGEAL ECHOCARDIOGRAM (TEE) (N/A) CIRC ARREST AND REPLACEMENT OF ASCENDING THORACIC  ANEURYSM (N/A) Subjective:  Feels weak overall. 3 person assist to get up to chair according to nurses.   Has not been sleeping much. Taking liquids well. Bowels working. No abdominal pain.  Objective: Vital signs in last 24 hours: Temp:  [96.5 F (35.8 C)-97.7 F (36.5 C)] 97.6 F (36.4 C) (03/13 0722) Pulse Rate:  [85-108] 104 (03/13 0600) Cardiac Rhythm:  [-] Atrial fibrillation (03/13 0400) Resp:  [12-26] 15 (03/13 0700) BP: (92-136)/(61-93) 119/77 mmHg (03/13 0700) SpO2:  [88 %-100 %] 97 % (03/13 0600) Weight:  [74.6 kg (164 lb 7.4 oz)] 74.6 kg (164 lb 7.4 oz) (03/13 0600)  Hemodynamic parameters for last 24 hours: CVP:  [6 mmHg-11 mmHg] 6 mmHg  Intake/Output from previous day: 03/12 0701 - 03/13 0700 In: 1980 [P.O.:1360; I.V.:120; IV Piggyback:500] Out: 1020 [Urine:1020] Intake/Output this shift:    General appearance: fatigued Neurologic: intact Heart: irregularly irregular rhythm Lungs: diminished breath sounds LLL and RLL Abdomen: soft, non-tender; bowel sounds normal; no masses,  no organomegaly Extremities: edema moderate peripheral Wound: incisions ok  Lab Results:  Recent Labs  01/17/16 1245 01/18/16 0500  WBC 14.9* 15.7*  HGB 10.6* 10.4*  HCT 34.4* 33.9*  PLT 58* 65*   BMET:  Recent Labs  01/17/16 1245 01/18/16 0500  NA 137 139  K 3.4* 3.8  CL 92* 94*  CO2 34* 37*  GLUCOSE 90 102*  BUN 42* 40*  CREATININE 1.21 1.19  CALCIUM 8.2* 8.2*    PT/INR:  Recent Labs  01/18/16 0500  LABPROT 18.6*  INR 1.55*   ABG    Component Value Date/Time   PHART 7.366 01/12/2016 1529   HCO3 33.4* 01/12/2016 1529   TCO2 35 01/12/2016 1529   ACIDBASEDEF 3.0*  01/11/2016 0728   O2SAT 86.0 01/12/2016 1529   CBG (last 3)   Recent Labs  01/17/16 1919 01/18/16 0107 01/18/16 0457  GLUCAP 85 129* 99   CLINICAL DATA: Respiratory failure  EXAM: PORTABLE CHEST 1 VIEW  COMPARISON: Chest radiograph from one day prior.  FINDINGS: Sternotomy wires appear aligned and intact. Cardiac valvular prosthesis is in place. Left subclavian central venous catheter terminates in the upper third of the superior vena cava. Stable cardiomediastinal silhouette with mild cardiomegaly. No pneumothorax. Small bilateral pleural effusions, slightly increased bilaterally. Mild pulmonary edema, slightly increased. Patchy bibasilar lung opacities, slightly increased on the left.  IMPRESSION: 1. Mild congestive heart failure, slightly worsened. 2. Small bilateral pleural effusions, slightly increased bilaterally. 3. Patchy bibasilar lung opacities, slightly increased on the left, favor atelectasis, cannot exclude a component of pneumonia.   Electronically Signed  By: Delbert PhenixJason A Poff M.D.  On: 01/18/2016 08:11  Assessment/Plan: S/P Procedure(s) (LRB): CORONARY ARTERY BYPASS GRAFTING (CABG), ON PUMP, TIMES FOUR, USING LEFT INTERNAL MAMMARY ARTERY, BILATERAL GREATER SAPHENOUS VEINS HARVESTED ENDOSCOPICALLY (N/A) TRANSESOPHAGEAL ECHOCARDIOGRAM (TEE) (N/A) CIRC ARREST AND REPLACEMENT OF ASCENDING THORACIC  ANEURYSM (N/A)  He remains hemodynamically stable in atrial fibrillation with controlled rate on Lopressor 25 bid. Ventricular pacing wire tip broke off so will remove wires today. He has not had any further pauses or vagal episodes.  Respiratory status is still an issue. He is improved after bilateral thoracentesis but CXR still shows bilateral effusion and lower lobe atelectasis. Effusions related to low albumin and volume excess.  Continue IS and flutter valve.   Suspected sepsis as cause of his decompensation one week ago. All cultures negative. He has  had a one week course of vanc and imipenem so will stop the antibiotics now and observe. Remove central line to decrease infection risk. Will keep foley in for now since he is not mobile and we are keeping track of his urine output. Still has some leukocytosis so need to watch carefully.  Acute renal failure resolved. He has significant residual volume excess with weight 25 lbs over preop. BMET is consistent with a contraction alkalosis and CVP is low so will hold off on further diuresis now and replace K+.   Advance diet and encourage nutrition.  PT consult for help with mobilization.  Continue coumadin for atrial fibrillation and tissue AVR with goal INR 2-2.5.  Shock liver resolved. Bilirubin still elevated but should come down with time.      LOS: 23 days    Alleen Borne 01/18/2016

## 2016-01-18 NOTE — Progress Notes (Signed)
      [address]       [place] [id number]             [phone]      Stable day with no new issues  BP 98/79 mmHg  Pulse 89  Temp(Src) 97.5 F (36.4 C) (Oral)  Resp 13  Ht 5\' 9"  (1.753 m)  Wt 164 lb 7.4 oz (74.6 kg)  BMI 24.28 kg/m2  SpO2 99%   Intake/Output Summary (Last 24 hours) at 01/18/16 1719 Last data filed at 01/18/16 1600  Gross per 24 hour  Intake   1900 ml  Output   1385 ml  Net    515 ml    Afebrile with antibiotics stopped  [name] C. Dorris FetchHendrickson, MD [facility] [phone]

## 2016-01-18 NOTE — Progress Notes (Signed)
Pacing wires removed per order without incident.

## 2016-01-18 NOTE — Plan of Care (Signed)
Problem: Activity: Goal: Risk for activity intolerance will decrease Outcome: Progressing PT order obtained from MD

## 2016-01-18 NOTE — Progress Notes (Addendum)
PCCM PROGRESS NOTE  ADMISSION DATE: 12/26/2015 CONSULT DATE: 12/27/2015 REFERRING PROVIDER: Triad  CC: Short of breath  SUBJECTIVE: Feels much better after getting thoracentesis yesterday.  VITAL SIGNS: BP 123/98 mmHg  Pulse 105  Temp(Src) 97.6 F (36.4 C) (Oral)  Resp 23  Ht  (1.753 m)  Wt 74.6 kg (164 lb 7.4 oz)  BMI 24.28 kg/m2  SpO2 99%  INTAKE/OUTPUT: I/O last 3 completed shifts: In: 3023 [P.O.:2080; I.V.:243; IV Piggyback:700] Out: 1670 [Urine:1670]  General: sitting up in bed HEENT: no stridor, [place]/AT, PERRL, EOM-I and MMM. Cardiac: RRR, 2/6 SM Chest: decreased BS at bases, no wheeze Abd: soft, non tender, ND and +BS Ext: no edema and -tenderness. Neuro: alert, follows commands Skin: no rashes  CBC Recent Labs     01/16/16  0410  01/17/16  1245  01/18/16  0500  WBC  16.0*  14.9*  15.7*  HGB  10.6*  10.6*  10.4*  HCT  34.3*  34.4*  33.9*  PLT  67*  58*  65*   Coag's Recent Labs     01/16/16  0410  01/17/16  0430  01/18/16  0500  INR  1.43  1.51*  1.55*   BMET Recent Labs     01/16/16  0410  01/17/16  1245  01/18/16  0500  NA  144  137  139  K  3.5  3.4*  3.8  CL  96*  92*  94*  CO2  35*  34*  37*  BUN  54*  42*  40*  CREATININE  1.45*  1.21  1.19  GLUCOSE  118*  90  102*   LFT's Lab Results  Component Value Date   ALT 462* 01/18/2016   AST 95* 01/18/2016   ALKPHOS 93 01/18/2016   BILITOT 3.7* 01/18/2016   Electrolytes Recent Labs     01/16/16  0410  01/17/16  1245  01/18/16  0500  CALCIUM  8.5*  8.2*  8.2*   ABG No results for input(s): PHART, PCO2ART, PO2ART in the last 72 hours.  Glucose Recent Labs     01/17/16  1501  01/17/16  1634  01/17/16  1919  01/18/16  0107  01/18/16  0457  01/18/16  0718  GLUCAP  128*  96  85  129*  99  94   Imaging Dg Chest Port 1 View  01/18/2016  CLINICAL DATA:  Respiratory failure EXAM: PORTABLE CHEST 1 VIEW COMPARISON:  Chest radiograph from one day prior. FINDINGS: Sternotomy  wires appear aligned and intact. Cardiac valvular prosthesis is in place. Left subclavian central venous catheter terminates in the upper third of the superior vena cava. Stable cardiomediastinal silhouette with mild cardiomegaly. No pneumothorax. Small bilateral pleural effusions, slightly increased bilaterally. Mild pulmonary edema, slightly increased. Patchy bibasilar lung opacities, slightly increased on the left. IMPRESSION: 1. Mild congestive heart failure, slightly worsened. 2. Small bilateral pleural effusions, slightly increased bilaterally. 3. Patchy bibasilar lung opacities, slightly increased on the left, favor atelectasis, cannot exclude a component of pneumonia. Electronically Signed   By: Delbert Phenix M.D.   On: 01/18/2016 08:11   Dg Chest Port 1 View  01/17/2016  CLINICAL DATA:  Status post thoracentesis. EXAM: PORTABLE CHEST - 1 VIEW COMPARISON:  One-view chest x-ray 01/16/2016 FINDINGS: There is significant reduction and a right-sided pleural effusion. There is no pneumothorax. Persistent right pleural fluid and airspace disease is present. A left pleural effusion and airspace disease is unchanged. The heart is enlarged. A left  subclavian line is stable. IMPRESSION: 1. Significant reduction and a right-sided pleural effusion without pneumothorax. 2. Persistent bilateral pleural effusions and airspace disease. 3. Cardiomegaly. 4. Left subclavian line is stable. Electronically Signed   By: Marin Robertshristopher  Mattern M.D.   On: 01/17/2016 10:36   [place] Thoracentesis Asp Pleural Space W/img Guide  01/17/2016  INDICATION: Symptomatic left sided pleural effusion EXAM: US THORACENTESIS ASP PLEURAL SPACE W/IMG GUIDE COMPARISON:  Previous thoracentesis MEDICATIONS: 10 cc 1% lidocaine COMPLICATIONS: None immediate. TECHNIQUE: Informed written consent was obtained from the patient after a discussion of the risks, benefits and alternatives to treatment. A timeout was performed prior to the initiation of the  procedure. Initial ultrasound scanning demonstrates a left pleural effusion. The lower chest was prepped and draped in the usual sterile fashion. 1% lidocaine was used for local anesthesia. Under direct ultrasound guidance, a 19 gauge, 7-cm, Yueh catheter was introduced. An ultrasound image was saved for documentation purposes. The thoracentesis was performed. The catheter was removed and a dressing was applied. The patient tolerated the procedure well without immediate post procedural complication. The patient was escorted to have an upright chest radiograph. FINDINGS: A total of approximately 1.3 liters of bloody fluid was removed. Requested samples were sent to the laboratory. IMPRESSION: Successful ultrasound-guided left sided thoracentesis yielding 1.3 liters of pleural fluid. Read by:  Robet LeuPamela A Turpin [facility] Electronically Signed   By: Malachy MoanHeath  McCullough M.D.   On: 01/17/2016 11:21   [place] Thoracentesis Asp Pleural Space W/img Guide  01/16/2016  INDICATION: 65 year old male with bilateral layering pleural effusions and dyspnea requiring BiPAP EXAM: ULTRASOUND GUIDED RIGHT THORACENTESIS MEDICATIONS: None. COMPLICATIONS: None immediate. PROCEDURE: An ultrasound guided thoracentesis was thoroughly discussed with the patient and questions answered. The benefits, risks, alternatives and complications were also discussed. The patient understands and wishes to proceed with the procedure. Written consent was obtained. Ultrasound was performed to localize and mark an adequate pocket of fluid in the right chest. The area was then prepped and draped in the normal sterile fashion. 1% Lidocaine was used for local anesthesia. Under ultrasound guidance a 6 Fr Safe-T-Centesis catheter was introduced. Thoracentesis was performed. The catheter was removed and a dressing applied. FINDINGS: A total of approximately 1400 mL of bloody pleural fluid was removed. Samples were sent to the laboratory as requested by the clinical team.  IMPRESSION: Successful ultrasound guided right thoracentesis yielding 1.4 L of bloody pleural fluid. Electronically Signed   By: Malachy MoanHeath  McCullough M.D.   On: 01/16/2016 14:00   CULTURES: 2/19 Respiratory viral panel >> negative 2/19 Sputum >> oral flora 2/19 Pneumococcal ag >> negative 3/06 Blood >> NTD  ANTIBIOTICS: 2/18 Zithromax >> 2/21 2/18 Rocephin >> 2/24 3/06 Imipenem >>3/12 3/06 Vancomycin >>3/12  STUDIES: 2/18 CT chest >> emphysema, consolidation RLL 2/20 Echo >> EF 35 to 40%, severe AS, 42 mm aortic root, mild MR 2/27 PFT >> FEV1 1.26 (37%), FEV1% 34, TLC 6.17 (90%), DLCO 26%, no BD 3/06 Echo >> normal LV/RV systolic fx 3/06 Abd u/s >> GB wall thickening w/o gallstones 3/06 CT abd/pelvis >> GB sludge, non obstructive renal stones b/l, diverticulosis 3/06 CT chest >> b/l effusions, centrilobular and paraseptal emphysema, compressive ATX  EVENTS: 2/18 Admit, cardiology consulted 2/19 To ICU, VDRF 2/21 Rt/Lt heart cath, TCTS consulted 2/26 PAF 3/02 CABG, AVR, replacement of ascending thoracic aortic aneurysm 3/03 Transfuse PRBC 3/06 resp failure, lactic acidosis, ARF, shock liver, ETT 3/08 Off pressors 3/11 Rt thoracentesis >> 1400 ml fluid  LINES/TUBES: 2/19 ETT >>  2/19 (self extubated) 3/02 ETT >> 3/03 3/02 Rt radial a line >>3/04 3/02 Rt IJ introducer >>3/04 3/06 ETT >> 3/07 3/06 Lt [place] CVL 3/06 >> 3/06 Lt femoral CVL >> 3/08  DISCUSSION: 65 yo male former smoker presented with fever, cough, dyspnea, chills from pneumonia.  He was found to have troponin elevation.  He has hx of COPD/emphysema.  ASSESSMENT/PLAN:  PULMONARY A: Acute on chronic hypoxic/hypercapnic respiratory failure >> much improved after Rt thoracentesis 3/11. COPD/emphysema with exacerbation. B/l pleural effusions. P: Oxygen to keep SpO2 90 to 95% BiPAP prn F/u CXR PRN Lasix as ordered Lt thoracentesis by IR planned for 3/12 Continue brovana, pulmicort, spiriva PRN  albuterol Ambulate.  CARDIOLOGY A: NSTEMI. Severe AS and severe CAD s/p CABG and AVR 3/02. Acute on chronic systolic/diastolic CHF. New shock 3/06 >> likely from sepsis. PAF >> sinus rhythm now. HLD. P: Continue lopressor Coumadin on hold for thoracentesis per TCTS, may restart. INR in AM.  RENAL A: ARF likely from ATN >> improving. Hyperkalemia >> resolved. P: Monitor renal fx, urine outpt, electrolytes Replace electrolytes as indicated Lasix 40 mg IV q8 hours x2 doses.  GASTROENTEROLOGY A: Nutrition. Shock liver >> LFTs trending down. P: Advance diet per TCTS F/u LFTs  HEMATOLOGY A: Anemia of critical illness. Thrombocytopenia. P: F/u CBC  INFECTION A: CAP >> completed Abx 2/24. Septic shock 3/06 >> possible sources are line vs respiratory vs gallbladder >> improving. P: Day 7/7 of vancomycin, imipenem >> likely can d/c abx affter doses on 3/12  ENDOCRINE A: Hyperglycemia. P: SSI  NEUROLOGY A: Post-op pain control. Deconditioning. P: Prn fentanyl Mobilize as able  Discussed with bedside RN.  Alyson Reedy, M.D. [facility] Pulmonary/Critical Care Medicine. Pager: [phone]. After hours pager: [phone].  01/18/2016, 9:52 AM

## 2016-01-19 DIAGNOSIS — J90 Pleural effusion, not elsewhere classified: Secondary | ICD-10-CM

## 2016-01-19 DIAGNOSIS — J9811 Atelectasis: Secondary | ICD-10-CM

## 2016-01-19 DIAGNOSIS — E440 Moderate protein-calorie malnutrition: Secondary | ICD-10-CM

## 2016-01-19 LAB — CBC
HCT: 36 % — ABNORMAL LOW (ref 39.0–52.0)
HEMOGLOBIN: 11 g/dL — AB (ref 13.0–17.0)
MCH: 29.9 pg (ref 26.0–34.0)
MCHC: 30.6 g/dL (ref 30.0–36.0)
MCV: 97.8 fL (ref 78.0–100.0)
Platelets: 67 10*3/uL — ABNORMAL LOW (ref 150–400)
RBC: 3.68 MIL/uL — ABNORMAL LOW (ref 4.22–5.81)
RDW: 19 % — AB (ref 11.5–15.5)
WBC: 17.4 10*3/uL — AB (ref 4.0–10.5)

## 2016-01-19 LAB — COMPREHENSIVE METABOLIC PANEL
ALBUMIN: 2.5 g/dL — AB (ref 3.5–5.0)
ALT: 323 U/L — ABNORMAL HIGH (ref 17–63)
ANION GAP: 11 (ref 5–15)
AST: 77 U/L — ABNORMAL HIGH (ref 15–41)
Alkaline Phosphatase: 96 U/L (ref 38–126)
BILIRUBIN TOTAL: 3.7 mg/dL — AB (ref 0.3–1.2)
BUN: 42 mg/dL — AB (ref 6–20)
CALCIUM: 8.2 mg/dL — AB (ref 8.9–10.3)
CO2: 35 mmol/L — ABNORMAL HIGH (ref 22–32)
Chloride: 94 mmol/L — ABNORMAL LOW (ref 101–111)
Creatinine, Ser: 1.42 mg/dL — ABNORMAL HIGH (ref 0.61–1.24)
GFR calc Af Amer: 59 mL/min — ABNORMAL LOW (ref >60)
GFR, EST NON AFRICAN AMERICAN: 51 mL/min — AB (ref >60)
GLUCOSE: 112 mg/dL — AB (ref 65–99)
Potassium: 4.2 mmol/L (ref 3.5–5.1)
Sodium: 140 mmol/L (ref 135–145)
TOTAL PROTEIN: 5.1 g/dL — AB (ref 6.5–8.1)

## 2016-01-19 LAB — MAGNESIUM: MAGNESIUM: 1.8 mg/dL (ref 1.7–2.4)

## 2016-01-19 LAB — GLUCOSE, CAPILLARY
GLUCOSE-CAPILLARY: 83 mg/dL (ref 65–99)
GLUCOSE-CAPILLARY: 97 mg/dL (ref 65–99)
Glucose-Capillary: 94 mg/dL (ref 65–99)

## 2016-01-19 LAB — PHOSPHORUS: PHOSPHORUS: 4.1 mg/dL (ref 2.5–4.6)

## 2016-01-19 LAB — PROTIME-INR
INR: 1.46 (ref 0.00–1.49)
PROTHROMBIN TIME: 17.8 s — AB (ref 11.6–15.2)

## 2016-01-19 MED ORDER — WARFARIN SODIUM 2.5 MG PO TABS
2.5000 mg | ORAL_TABLET | Freq: Every day | ORAL | Status: DC
  Administered 2016-01-19 – 2016-01-21 (×3): 2.5 mg via ORAL
  Filled 2016-01-19 (×3): qty 1

## 2016-01-19 MED ORDER — GUAIFENESIN ER 600 MG PO TB12
1200.0000 mg | ORAL_TABLET | Freq: Two times a day (BID) | ORAL | Status: DC
  Administered 2016-01-19 – 2016-02-03 (×30): 1200 mg via ORAL
  Filled 2016-01-19 (×33): qty 2

## 2016-01-19 NOTE — Progress Notes (Signed)
CT surgery p.m. Rounds  Patient examined and record reviewed.Hemodynamics stable,labs satisfactory.Patient had stable day.Continue current care. Kathlee Nationseter Van Trigt III 01/19/2016

## 2016-01-19 NOTE — Evaluation (Signed)
Physical Therapy Evaluation Patient Details Name: Craig [name] MRN: [id number] DOB: 1951-07-25 Today's Date: 01/19/2016   History of Present Illness  Patient is a 65 y/o male with hx of COPD, HTN, HLD, alcohol abuse and tobacco abuse presents with CAP, NSTEMI and intubated due to respiratory failure 2/19-2/20, s/p CABG x4 on 3/2 and s/p bilateral thoracentesis 3/10-11. Continues to have tenuous respiratory status secondary to severe COPD, bilateral pleural effusions and atelectasis.    Clinical Impression  Patient presents with pain, generalized weakness, fatigue, decreased balance, endurance and impaired respiratory status due to above surgery and complicated hospitalization impacting function. Tolerated marching in standing and SPT to/from [facility] with Min A for balance/safety. Requires encouragement. Pt seems to have depressed mood. Pt plans to discharge home with support from sister. Will follow acutely to maximize independence and mobility prior to return home.    Follow Up Recommendations Home health PT;Supervision/Assistance - 24 hour    Equipment Recommendations  Rolling walker with 5" wheels    Recommendations for Other Services       Precautions / Restrictions Precautions Precautions: Fall;Sternal Restrictions Weight Bearing Restrictions: No      Mobility  Bed Mobility               General bed mobility comments: Sitting in chair upon PT arrival.   Transfers Overall transfer level: Needs assistance Equipment used: Rolling walker (2 wheeled) Transfers: Sit to/from Stand;Stand Pivot Transfers Sit to Stand: Min assist;+2 physical assistance;+2 safety/equipment Stand pivot transfers: Min assist       General transfer comment: cues for technique holding heart pillow to adhere to sternal precautions. Stood from chair x2, from [facility] x1. SPT chair to/from [facility] with Min A. Sp02 dropped to mid 80s on nasal cannula.  Ambulation/Gait                Stairs             Wheelchair Mobility    Modified Rankin (Stroke Patients Only)       Balance Overall balance assessment: Needs assistance Sitting-balance support: Feet supported Sitting balance-Leahy Scale: Fair Sitting balance - Comments: Sitting in chair without back support, fatigues quickly.    Standing balance support: During functional activity;Bilateral upper extremity supported Standing balance-Leahy Scale: Poor Standing balance comment: Reliant on BUE support for balance. Able to perform marching in place x10.                             Pertinent Vitals/Pain Pain Assessment: Faces Faces Pain Scale: Hurts little more Pain Location: incision Pain Descriptors / Indicators: Sore Pain Intervention(s): Monitored during session;Repositioned;Limited activity within patient's tolerance    Home Living Family/patient expects to be discharged to:: Private residence Living Arrangements: Children;Other relatives (plans to stay with sister at d/c.) Available Help at Discharge: Family;Available 24 hours/day Type of Home: House Home Access: Stairs to enter Entrance Stairs-Rails: Right Entrance Stairs-Number of Steps: 3? Home Layout: One level Home Equipment: None      Prior Function Level of Independence: Independent               Hand Dominance        Extremity/Trunk Assessment   Upper Extremity Assessment: Defer to OT evaluation;Generalized weakness           Lower Extremity Assessment: Generalized weakness         Communication   Communication: No difficulties  Cognition Arousal/Alertness: Awake/alert Behavior During Therapy: [facility]  for tasks assessed/performed Overall Cognitive Status: Within Functional Limits for tasks assessed                      General Comments General comments (skin integrity, edema, etc.): Bruising present BLEs distal to knees.    Exercises General Exercises - Lower Extremity Ankle Circles/Pumps: Both;15  reps;Seated Long Arc Quad: Both;10 reps;Seated      Assessment/Plan    PT Assessment Patient needs continued PT services  PT Diagnosis Difficulty walking;Generalized weakness;Acute pain   PT Problem List Decreased strength;Pain;Cardiopulmonary status limiting activity;Decreased activity tolerance;Decreased balance;Decreased mobility;Decreased knowledge of precautions;Decreased safety awareness  PT Treatment Interventions Balance training;Gait training;Stair training;Functional mobility training;Therapeutic activities;Therapeutic exercise;Patient/family education;DME instruction   PT Goals (Current goals can be found in the Care Plan section) Acute Rehab PT Goals Patient Stated Goal: to get stronger PT Goal Formulation: With patient Time For Goal Achievement: 02/02/16 Potential to Achieve Goals: Good    Frequency Min 3X/week   Barriers to discharge   steps to enter sisters home    Co-evaluation               End of Session Equipment Utilized During Treatment: Gait belt;Oxygen Activity Tolerance: Patient limited by fatigue;Patient limited by pain Patient left: in chair;with call bell/phone within reach;with SCD's reapplied Nurse Communication: Mobility status         Time: [id number] PT Time Calculation (min) (ACUTE ONLY): 34 min   Charges:   PT Evaluation $PT Eval Moderate Complexity: 1 Procedure PT Treatments $Therapeutic Activity: 8-22 mins   PT G Codes:        Dimples Probus A Lillieann Pavlich 01/19/2016, 1:33 PM Mylo RedShauna Jamil Armwood, PT, DPT [phone]

## 2016-01-19 NOTE — Progress Notes (Signed)
UR Completed. Cheridan Kibler, RN, BSN.  [phone] 

## 2016-01-19 NOTE — Progress Notes (Addendum)
Patient is hypotensive after receiving scheduled metoprolol as well as PRN ultram and Xanax. Pt's current BP is 76/65, map of 70. MD made aware. Will continue to monitor patient closely. Patient's BP immediately prior to medication administration was 129/93.  Craig SaxGerald S. Ladona Ridgelaylor RN

## 2016-01-19 NOTE — Progress Notes (Signed)
12 Days Post-Op Procedure(s) (LRB): CORONARY ARTERY BYPASS GRAFTING (CABG), ON PUMP, TIMES FOUR, USING LEFT INTERNAL MAMMARY ARTERY, BILATERAL GREATER SAPHENOUS VEINS HARVESTED ENDOSCOPICALLY (N/A) TRANSESOPHAGEAL ECHOCARDIOGRAM (TEE) (N/A) CIRC ARREST AND REPLACEMENT OF ASCENDING THORACIC  ANEURYSM (N/A) Subjective:  Weak and short of breath. Working on IS. Has not ambulated for over a week after episode of presumed sepsis.  Objective: Vital signs in last 24 hours: Temp:  [97 F (36.1 C)-97.7 F (36.5 C)] 97.7 F (36.5 C) (03/14 0738) Pulse Rate:  [37-117] 117 (03/14 0738) Cardiac Rhythm:  [-] Atrial fibrillation (03/14 0400) Resp:  [12-27] 23 (03/14 0738) BP: (76-142)/(60-105) 122/105 mmHg (03/14 0738) SpO2:  [82 %-100 %] 91 % (03/14 0738) Weight:  [73.8 kg (162 lb 11.2 oz)] 73.8 kg (162 lb 11.2 oz) (03/14 0500)  Hemodynamic parameters for last 24 hours:    Intake/Output from previous day: 03/13 0701 - 03/14 0700 In: 1440 [P.O.:1440] Out: 1990 [Urine:1990] Intake/Output this shift:    General appearance: alert, cooperative and fatigued Neurologic: intact Heart: irregularly irregular rhythm Lungs: diminished breath sounds LLL and RLL Abdomen: soft, non-tender; bowel sounds normal; no masses,  no organomegaly Extremities: edema moderate peripheral Wound: chest incision healing well, right leg vein harvest incision opened up a little.  Lab Results:  Recent Labs  01/18/16 0500 01/19/16 0246  WBC 15.7* 17.4*  HGB 10.4* 11.0*  HCT 33.9* 36.0*  PLT 65* 67*   BMET:  Recent Labs  01/18/16 0500 01/19/16 0246  NA 139 140  K 3.8 4.2  CL 94* 94*  CO2 37* 35*  GLUCOSE 102* 112*  BUN 40* 42*  CREATININE 1.19 1.42*  CALCIUM 8.2* 8.2*    PT/INR:  Recent Labs  01/19/16 0246  LABPROT 17.8*  INR 1.46   ABG    Component Value Date/Time   PHART 7.366 01/12/2016 1529   HCO3 33.4* 01/12/2016 1529   TCO2 35 01/12/2016 1529   ACIDBASEDEF 3.0* 01/11/2016 0728   O2SAT 86.0 01/12/2016 1529   CBG (last 3)   Recent Labs  01/18/16 2025 01/19/16 0004 01/19/16 0358  GLUCAP 104* 97 94    Assessment/Plan: S/P Procedure(s) (LRB): CORONARY ARTERY BYPASS GRAFTING (CABG), ON PUMP, TIMES FOUR, USING LEFT INTERNAL MAMMARY ARTERY, BILATERAL GREATER SAPHENOUS VEINS HARVESTED ENDOSCOPICALLY (N/A) TRANSESOPHAGEAL ECHOCARDIOGRAM (TEE) (N/A) CIRC ARREST AND REPLACEMENT OF ASCENDING THORACIC  ANEURYSM (N/A)  He is hemodynamically stable in rate controlled atrial fibrillation.  Respiratory status is tenuous due to severe COPD, bilateral pleural effusions and atelectasis. I am concerned that he is going to develop pneumonia with weak cough. His WBC is increasing some but remains afebrile. He has a rattling cough and some sputum. Continue IS.  Creat up some from yesterday which may be due to diuresis. I think he is intravascularly dry with edema due to low albumin. I would not diurese further.  He needs to mobilize with PT.  Encourage nutrition.   His condition remains tenuous and I don't think it will take too much for him to decompensate again. He is very weak.   LOS: 24 days    Alleen BorneBryan K Mayvis Agudelo 01/19/2016

## 2016-01-19 NOTE — Progress Notes (Signed)
PCCM PROGRESS NOTE  ADMISSION DATE: 12/26/2015 CONSULT DATE: 12/27/2015 REFERRING PROVIDER: [facility]ad  CC: Short of breath  SUBJECTIVE: No events overnight, feels better than he is eating solid food now.  VITAL SIGNS: BP 110/69 mmHg  Pulse 25  Temp(Src) 97.7 F (36.5 C) (Oral)  Resp 22  Ht 5\' 9"  (1.753 m)  Wt 73.8 kg (162 lb 11.2 oz)  BMI 24.02 kg/m2  SpO2 99%  INTAKE/OUTPUT: I/O last 3 completed shifts: In: 2500 [P.O.:2280; I.V.:20; IV Piggyback:200] Out: 2590 [Urine:2590]  General: sitting up in a chair but very weak, required 2 PT members to move to chair. HEENT: no stridor, [place]/AT, PERRL, EOM-I and MMM. Cardiac: RRR, 2/6 SM Chest: decreased BS at bases, no wheeze but air movement is not great. Abd: soft, non tender, ND and +BS Ext: no edema and -tenderness. Neuro: alert, follows commands Skin: no rashes  CBC Recent Labs     01/17/16  1245  01/18/16  0500  01/19/16  0246  WBC  14.9*  15.7*  17.4*  HGB  10.6*  10.4*  11.0*  HCT  34.4*  33.9*  36.0*  PLT  58*  65*  67*   Coag's Recent Labs     01/17/16  0430  01/18/16  0500  01/19/16  0246  INR  1.51*  1.55*  1.46   BMET Recent Labs     01/17/16  1245  01/18/16  0500  01/19/16  0246  NA  137  139  140  K  3.4*  3.8  4.2  CL  92*  94*  94*  CO2  34*  37*  35*  BUN  42*  40*  42*  CREATININE  1.21  1.19  1.42*  GLUCOSE  90  102*  112*   LFT's Lab Results  Component Value Date   ALT 323* 01/19/2016   AST 77* 01/19/2016   ALKPHOS 96 01/19/2016   BILITOT 3.7* 01/19/2016   Electrolytes Recent Labs     01/17/16  1245  01/18/16  0500  01/19/16  0246  CALCIUM  8.2*  8.2*  8.2*  MG   --    --   1.8  PHOS   --    --   4.1   ABG No results for input(s): PHART, PCO2ART, PO2ART in the last 72 hours.  Glucose Recent Labs     01/18/16  1254  01/18/16  1510  01/18/16  2025  01/19/16  0004  01/19/16  0358  01/19/16  0737  GLUCAP  106*  125*  104*  97  94  83   Imaging Dg Chest Port 1  View  01/18/2016  CLINICAL DATA:  Respiratory failure EXAM: PORTABLE CHEST 1 VIEW COMPARISON:  Chest radiograph from one day prior. FINDINGS: Sternotomy wires appear aligned and intact. Cardiac valvular prosthesis is in place. Left subclavian central venous catheter terminates in the upper third of the superior vena cava. Stable cardiomediastinal silhouette with mild cardiomegaly. No pneumothorax. Small bilateral pleural effusions, slightly increased bilaterally. Mild pulmonary edema, slightly increased. Patchy bibasilar lung opacities, slightly increased on the left. IMPRESSION: 1. Mild congestive heart failure, slightly worsened. 2. Small bilateral pleural effusions, slightly increased bilaterally. 3. Patchy bibasilar lung opacities, slightly increased on the left, favor atelectasis, cannot exclude a component of pneumonia. Electronically Signed   By: Delbert PhenixJason A Poff M.D.   On: 01/18/2016 08:11   CULTURES: 2/19 Respiratory viral panel >> negative 2/19 Sputum >> oral flora 2/19 Pneumococcal ag >> negative  3/06 Blood >> NTD  ANTIBIOTICS: 2/18 Zithromax >> 2/21 2/18 Rocephin >> 2/24 3/06 Imipenem >>3/12 3/06 Vancomycin >>3/12  STUDIES: 2/18 CT chest >> emphysema, consolidation RLL 2/20 Echo >> EF 35 to 40%, severe AS, 42 mm aortic root, mild MR 2/27 PFT >> FEV1 1.26 (37%), FEV1% 34, TLC 6.17 (90%), DLCO 26%, no BD 3/06 Echo >> normal LV/RV systolic fx 3/06 Abd u/s >> GB wall thickening w/o gallstones 3/06 CT abd/pelvis >> GB sludge, non obstructive renal stones b/l, diverticulosis 3/06 CT chest >> b/l effusions, centrilobular and paraseptal emphysema, compressive ATX  EVENTS: 2/18 Admit, cardiology consulted 2/19 To ICU, VDRF 2/21 Rt/Lt heart cath, TCTS consulted 2/26 PAF 3/02 CABG, AVR, replacement of ascending thoracic aortic aneurysm 3/03 Transfuse PRBC 3/06 resp failure, lactic acidosis, ARF, shock liver, ETT 3/08 Off pressors 3/11 Rt thoracentesis >> 1400 ml fluid 3/14 remains very  weak.  LINES/TUBES: 2/19 ETT >> 2/19 (self extubated) 3/02 ETT >> 3/03 3/02 Rt radial a line >>3/04 3/02 Rt IJ introducer >>3/04 3/06 ETT >> 3/07 3/06 Lt [place] CVL 3/06 >> 3/06 Lt femoral CVL >> 3/08  DISCUSSION: 65 yo male former smoker presented with fever, cough, dyspnea, chills from pneumonia.  He was found to have troponin elevation.  He has hx of COPD/emphysema.  ASSESSMENT/PLAN:  PULMONARY A: Acute on chronic hypoxic/hypercapnic respiratory failure >> much improved after Rt thoracentesis 3/11. COPD/emphysema with exacerbation. B/l pleural effusions. P: Oxygen to keep SpO2 90 to 95% D/C BiPAP. Add mucinex to help with sputum expulsion F/u CXR in AM to evaluate pleural effusion, hope is that there is no increase given even fluid balance at this point D/C lasix. Continue brovana, pulmicort, spiriva PRN albuterol Ambulate as able.  CARDIOLOGY A: NSTEMI. Severe AS and severe CAD s/p CABG and AVR 3/02. Acute on chronic systolic/diastolic CHF. New shock 3/06 >> likely from sepsis. PAF >> sinus rhythm now. HLD. P: Continue lopressor Coumadin restarted. INR in AM.  RENAL A: ARF likely from ATN >> improving. Hyperkalemia >> resolved. P: Monitor renal fx, urine outpt, electrolytes Replace electrolytes as indicated D/C further diureses.  GASTROENTEROLOGY A: Nutrition. Shock liver >> LFTs trending down. P: Started regular diet and feeling better. CMET in AM.  HEMATOLOGY A: Anemia of critical illness. Thrombocytopenia. P: F/u CBC  INFECTION A: CAP >> completed Abx 2/24. Septic shock 3/06 >> possible sources are line vs respiratory vs gallbladder >> improving. P: Day 7/7 of vancomycin, imipenem >> likely can d/c abx affter doses on 3/12 WBC is increasing however, monitor for signs of infection, no abx as afebrile.  ENDOCRINE A: Hyperglycemia. P: SSI  NEUROLOGY A: Post-op pain control. Deconditioning. P: Prn fentanyl Mobilize as  able  Discussed with bedside RN.  Alyson Reedy, M.D. [facility] Pulmonary/Critical Care Medicine. Pager: [phone]. After hours pager: [phone].  01/19/2016, 10:58 AM

## 2016-01-19 NOTE — Progress Notes (Signed)
   01/19/16 1300  Clinical Encounter Type  Visited With Patient  Visit Type Spiritual support  Referral From Nurse  Spiritual Encounters  Spiritual Needs Emotional;Grief support  Stress Factors  Patient Stress Factors Health changes;Loss  Nurses all very concerned about this patient and anxious for me to visit him. On first visit, he did not seem welcoming. This time he recognized me and was much friendlier, but we only talked a short time before he indicated his desire to rest. I will return on Wednesday.

## 2016-01-20 ENCOUNTER — Inpatient Hospital Stay (HOSPITAL_COMMUNITY): Payer: Medicaid Other

## 2016-01-20 LAB — COMPREHENSIVE METABOLIC PANEL WITH GFR
ALT: 255 U/L — ABNORMAL HIGH (ref 17–63)
AST: 68 U/L — ABNORMAL HIGH (ref 15–41)
Albumin: 2.6 g/dL — ABNORMAL LOW (ref 3.5–5.0)
Alkaline Phosphatase: 100 U/L (ref 38–126)
Anion gap: 13 (ref 5–15)
BUN: 48 mg/dL — ABNORMAL HIGH (ref 6–20)
CO2: 34 mmol/L — ABNORMAL HIGH (ref 22–32)
Calcium: 8.5 mg/dL — ABNORMAL LOW (ref 8.9–10.3)
Chloride: 92 mmol/L — ABNORMAL LOW (ref 101–111)
Creatinine, Ser: 1.43 mg/dL — ABNORMAL HIGH (ref 0.61–1.24)
GFR calc Af Amer: 58 mL/min — ABNORMAL LOW
GFR calc non Af Amer: 50 mL/min — ABNORMAL LOW
Glucose, Bld: 114 mg/dL — ABNORMAL HIGH (ref 65–99)
Potassium: 4.4 mmol/L (ref 3.5–5.1)
Sodium: 139 mmol/L (ref 135–145)
Total Bilirubin: 2.3 mg/dL — ABNORMAL HIGH (ref 0.3–1.2)
Total Protein: 5.4 g/dL — ABNORMAL LOW (ref 6.5–8.1)

## 2016-01-20 LAB — CBC
HCT: 38.4 % — ABNORMAL LOW (ref 39.0–52.0)
Hemoglobin: 11.6 g/dL — ABNORMAL LOW (ref 13.0–17.0)
MCH: 29.8 pg (ref 26.0–34.0)
MCHC: 30.2 g/dL (ref 30.0–36.0)
MCV: 98.7 fL (ref 78.0–100.0)
Platelets: 93 K/uL — ABNORMAL LOW (ref 150–400)
RBC: 3.89 MIL/uL — ABNORMAL LOW (ref 4.22–5.81)
RDW: 18.9 % — ABNORMAL HIGH (ref 11.5–15.5)
WBC: 17.5 K/uL — ABNORMAL HIGH (ref 4.0–10.5)

## 2016-01-20 LAB — MAGNESIUM: MAGNESIUM: 2.1 mg/dL (ref 1.7–2.4)

## 2016-01-20 LAB — PROTIME-INR
INR: 1.49 (ref 0.00–1.49)
Prothrombin Time: 18.1 seconds — ABNORMAL HIGH (ref 11.6–15.2)

## 2016-01-20 LAB — PHOSPHORUS: Phosphorus: 4.3 mg/dL (ref 2.5–4.6)

## 2016-01-20 NOTE — Progress Notes (Signed)
PCCM PROGRESS NOTE  ADMISSION DATE: 12/26/2015 CONSULT DATE: 12/27/2015 REFERRING PROVIDER: [facility]  CC: Short of breath  SUBJECTIVE: Feels better this AM.  VITAL SIGNS: BP 109/78 mmHg  Pulse 92  Temp(Src) 97.6 F (36.4 C) (Oral)  Resp 20  Ht  (1.753 m)  Wt 73.9 kg (162 lb 14.7 oz)  BMI 24.05 kg/m2  SpO2 98%  INTAKE/OUTPUT: I/O last 3 completed shifts: In: 1552 [P.O.:1552] Out: 1765 [Urine:1765]  General: sitting up in a chair, comfortable. HEENT: no stridor, [place]/AT, PERRL, EOM-I and MMM. Cardiac: RRR, 2/6 SM Chest: decreased BS at bases, no wheeze but air movement is not great. Abd: soft, non tender, ND and +BS Ext: no edema and -tenderness. Neuro: alert, follows commands Skin: no rashes  CBC Recent Labs     01/18/16  0500  01/19/16  0246  01/20/16  0225  WBC  15.7*  17.4*  17.5*  HGB  10.4*  11.0*  11.6*  HCT  33.9*  36.0*  38.4*  PLT  65*  67*  93*   Coag's Recent Labs     01/18/16  0500  01/19/16  0246  01/20/16  0225  INR  1.55*  1.46  1.49   BMET Recent Labs     01/18/16  0500  01/19/16  0246  01/20/16  0225  NA  139  140  139  K  3.8  4.2  4.4  CL  94*  94*  92*  CO2  37*  35*  34*  BUN  40*  42*  48*  CREATININE  1.19  1.42*  1.43*  GLUCOSE  102*  112*  114*   LFT's Lab Results  Component Value Date   ALT 255* 01/20/2016   AST 68* 01/20/2016   ALKPHOS 100 01/20/2016   BILITOT 2.3* 01/20/2016   Electrolytes Recent Labs     01/18/16  0500  01/19/16  0246  01/20/16  0225  CALCIUM  8.2*  8.2*  8.5*  MG   --   1.8  2.1  PHOS   --   4.1  4.3   ABG No results for input(s): PHART, PCO2ART, PO2ART in the last 72 hours.  Glucose Recent Labs     01/18/16  1254  01/18/16  1510  01/18/16  2025  01/19/16  0004  01/19/16  0358  01/19/16  0737  GLUCAP  106*  125*  104*  97  94  83   Imaging Dg Chest Port 1 View  01/20/2016  CLINICAL DATA:  65 year old male with a history of CABG. Patient has had coronary artery bypass  graft x 4, aortic valve replacement, ascending aorta hemi arch replacement with 30 mm Hemashield graft, 01/07/2016 EXAM: PORTABLE CHEST 1 VIEW COMPARISON:  01/18/2016 FINDINGS: Cardiomediastinal silhouette unchanged in size and contour. Surgical changes of median sternotomy and CABG, with aortic valve repair/ ascending aorta repair. Similar appearance of bibasilar opacities obscuring the hemidiaphragm on the left and the right, as well as the left and right heart border. No pneumothorax. Interval removal of left subclavian central catheter. IMPRESSION: Similar appearance of the prior chest x-ray, with bilateral small pleural effusions and associated atelectasis. Surgical changes of median sternotomy and CABG, with aortic valve repair/ascending aorta replacement. Interval removal of left subclavian central catheter. Signed, Yvone Neu. Loreta Ave, DO [facility] Radiology Electronically Signed   By: Gilmer Mor D.O.   On: 01/20/2016 07:52   CULTURES: 2/19 Respiratory viral panel >> negative 2/19  Sputum >> oral flora 2/19 Pneumococcal ag >> negative 3/06 Blood >> NTD  ANTIBIOTICS: 2/18 Zithromax >> 2/21 2/18 Rocephin >> 2/24 3/06 Imipenem >>3/12 3/06 Vancomycin >>3/12  STUDIES: 2/18 CT chest >> emphysema, consolidation RLL 2/20 Echo >> EF 35 to 40%, severe AS, 42 mm aortic root, mild MR 2/27 PFT >> FEV1 1.26 (37%), FEV1% 34, TLC 6.17 (90%), DLCO 26%, no BD 3/06 Echo >> normal LV/RV systolic fx 3/06 Abd u/s >> GB wall thickening w/o gallstones 3/06 CT abd/pelvis >> GB sludge, non obstructive renal stones b/l, diverticulosis 3/06 CT chest >> b/l effusions, centrilobular and paraseptal emphysema, compressive ATX  EVENTS: 2/18 Admit, cardiology consulted 2/19 To ICU, VDRF 2/21 Rt/Lt heart cath, TCTS consulted 2/26 PAF 3/02 CABG, AVR, replacement of ascending thoracic aortic aneurysm 3/03 Transfuse PRBC 3/06 resp failure, lactic acidosis, ARF, shock  liver, ETT 3/08 Off pressors 3/11 Rt thoracentesis >> 1400 ml fluid 3/14 remains very weak.  LINES/TUBES: 2/19 ETT >> 2/19 (self extubated) 3/02 ETT >> 3/03 3/02 Rt radial a line >>3/04 3/02 Rt IJ introducer >>3/04 3/06 ETT >> 3/07 3/06 Lt Hodges CVL 3/06 >> 3/06 Lt femoral CVL >> 3/08  I reviewed CXR myself, bilateral pleural effusion.  DISCUSSION: 65 yo male former smoker presented with fever, cough, dyspnea, chills from pneumonia.  He was found to have troponin elevation.  He has hx of COPD/emphysema.  ASSESSMENT/PLAN:  PULMONARY A: Acute on chronic hypoxic/hypercapnic respiratory failure >> much improved after Rt thoracentesis 3/11. COPD/emphysema with exacerbation. B/l pleural effusions. P: Oxygen to keep SpO2 90 to 95%. Continue mucinex to help with sputum expulsion F/u CXR in AM to evaluate pleural effusion, hope is that there is no increase given even fluid balance at this point Continue brovana, pulmicort, spiriva PRN albuterol Ambulate as able.  CARDIOLOGY A: NSTEMI. Severe AS and severe CAD s/p CABG and AVR 3/02. Acute on chronic systolic/diastolic CHF. New shock 3/06 >> likely from sepsis. PAF >> sinus rhythm now. HLD. P: Continue lopressor Coumadin restarted. INR in AM.  RENAL A: ARF likely from ATN >> improving. Hyperkalemia >> resolved. P: Monitor renal fx, urine outpt, electrolytes Replace electrolytes as indicated D/C further diureses.  GASTROENTEROLOGY A: Nutrition. Shock liver >> LFTs trending down. P: Started regular diet and feeling better. CMET in AM.  HEMATOLOGY A: Anemia of critical illness. Thrombocytopenia. P: F/u CBC  INFECTION A: CAP >> completed Abx 2/24. Septic shock 3/06 >> possible sources are line vs respiratory vs gallbladder >> improving. P: Day 7/7 of vancomycin, imipenem >> likely can d/c abx affter doses on 3/12 WBC is increasing however, monitor for signs of infection, no abx as  afebrile.  ENDOCRINE A: Hyperglycemia. P: SSI  NEUROLOGY A: Post-op pain control. Deconditioning. P: Prn fentanyl Mobilize as able  Discussed with bedside RN and Dr. Laneta SimmersBartle.  Alyson ReedyWesam G. Yacoub, M.D. [facility] Pulmonary/Critical Care Medicine. Pager: [phone]. After hours pager: [phone].  01/20/2016, 9:46 AM

## 2016-01-20 NOTE — Care Management Note (Signed)
  Patient Details  Name: Craig FeinsteinRobert S Val MRN: [id number] Date of Birth: 01/03/1951  Subjective/Objective:        Pt admitted with Acute Respiratory Failure    Action/Plan:   01/20/2016  Pt still experiencing respiratory issues and remains in ICU - [name] for 24 hours.  Recommendations have been placed for CIR.  CSW consulted for SNF backup plan.  CM will continue to monitor for disposition needs  01/08/16 Pt is s/p CABG  01/06/16 Pt is independent from home with son [name].  Pt is without insurance; will need PCP initiated at free clinic prior to discharge. CM will continue to monitor for disposition needs   Expected Discharge Date:                  Expected Discharge Plan:  Home/Self Care  In-House Referral:     Discharge planning Services  CM Consult  Post Acute Care Choice:    Choice offered to:     DME Arranged:    DME Agency:     HH Arranged:    HH Agency:     Status of Service:  In process, will continue to follow  Medicare Important Message Given:    Date Medicare IM Given:    Medicare IM give by:    Date Additional Medicare IM Given:    Additional Medicare Important Message give by:     If discussed at Long Length of Stay Meetings, dates discussed:  01/05/16, 01/07/16  Additional Comments:  01/06/16- 1145- Donn PieriniKristi Webster RN, BSN- plan for CABG/AVR tomorrow 3/2- CM will follow post op for d/c needs  Cherylann ParrClaxton, [place] Lebeau S, RN 01/20/2016, 11:56 AM

## 2016-01-20 NOTE — Progress Notes (Signed)
Physical Therapy Treatment Patient Details Name: Craig [name] MRN: [id number] DOB: [birth year] Today's Date: 01/20/2016    History of Present Illness Patient is a 65 y/o male with hx of COPD, HTN, HLD, alcohol abuse and tobacco abuse presents with CAP, NSTEMI and intubated due to respiratory failure 2/19-2/20, s/p CABG x4 on 3/2 and s/p bilateral thoracentesis 3/10-11. Continues to have tenuous respiratory status secondary to severe COPD, bilateral pleural effusions and atelectasis.      PT Comments    Patient progressing slowly towards PT goals. Improved ambulation distance to 8' and 15' with encouragement and long seated rest break due to dizziness. Fatigues quickly but highly motivated to return to PLOF. Instructed pt on exercises to perform during the day. Discharge recommendation updated to CIR so pt can maximize independence and mobility and return home with support of sister. Will follow.   Follow Up Recommendations  CIR     Equipment Recommendations  Rolling walker with 5" wheels    Recommendations for Other Services Rehab consult;OT consult     Precautions / Restrictions Precautions Precautions: Fall;Sternal Restrictions Weight Bearing Restrictions: No    Mobility  Bed Mobility               General bed mobility comments: Sitting in chair upon PT arrival.   Transfers Overall transfer level: Needs assistance Equipment used:  (w/c.) Transfers: Sit to/from Stand Sit to Stand: Mod assist;+2 physical assistance         General transfer comment: Mod A of 2 to boost from chair with cues for hand placement/technique and use of heart pillow. Dizziness in standing. Sp02 remained >90% on 4L/min 02 [place].   Ambulation/Gait Ambulation/Gait assistance: Min assist;+2 safety/equipment Ambulation Distance (Feet): 8 Feet (+ 15') Assistive device:  (pushed w/c) Gait Pattern/deviations: Step-through pattern;Decreased stride length;Trunk flexed   Gait velocity  interpretation: Below normal speed for age/gender General Gait Details: Slow, unsteady gait with Min A  for balance and w/c management. Fatigues. 2/4 DOE. + dizziness. BP and SP02 stable.   Stairs            Wheelchair Mobility    Modified Rankin (Stroke Patients Only)       Balance Overall balance assessment: Needs assistance Sitting-balance support: Feet supported;No upper extremity supported Sitting balance-Leahy Scale: Fair Sitting balance - Comments: Sitting in chair without back support, fatigues quickly.    Standing balance support: During functional activity;Bilateral upper extremity supported Standing balance-Leahy Scale: Poor Standing balance comment: Reliant on BUE support for balance.                     Cognition Arousal/Alertness: Awake/alert Behavior During Therapy: WFL for tasks assessed/performed Overall Cognitive Status: Within Functional Limits for tasks assessed                      Exercises General Exercises - Lower Extremity Ankle Circles/Pumps: Both;10 reps;Seated Long Arc Quad: Both;5 reps;Seated    General Comments        Pertinent Vitals/Pain Pain Assessment: No/denies pain    Home Living                      Prior Function            PT Goals (current goals can now be found in the care plan section) Progress towards PT goals: Progressing toward goals    Frequency  Min 3X/week    PT Plan Discharge plan needs to be  updated    Co-evaluation             End of Session Equipment Utilized During Treatment: Gait belt;Oxygen Activity Tolerance: Patient limited by fatigue;Patient tolerated treatment well Patient left: in chair;with call bell/phone within reach;with nursing/sitter in room     Time: 1011-1032 PT Time Calculation (min) (ACUTE ONLY): 21 min  Charges:  $Gait Training: 8-22 mins                    G Codes:      Emalee Knies A Shemica Meath 01/20/2016, 10:37 AM Mylo RedShauna Taivon Haroon, PT,  DPT [phone]

## 2016-01-20 NOTE — Progress Notes (Signed)
Inpatient Rehabilitation  Patient was screened by Camil Hausmann for appropriateness for an Inpatient Acute Rehab consult.  At this time, we are recommending Inpatient Rehab consult.  Please order consult if you are agreeable.  Craig [name] PT Inpatient Rehab Admissions Coordinator Cell [phone] Office [phone]    

## 2016-01-20 NOTE — Progress Notes (Signed)
Patient ID: Craig FeinsteinRobert S Sheperd, male   DOB: 04/23/1951, 65 y.o.   MRN: [id number] EVENING ROUNDS NOTE :     [address]       [place] [id number]             [phone]                 13 Days Post-Op Procedure(s) (LRB): CORONARY ARTERY BYPASS GRAFTING (CABG), ON PUMP, TIMES FOUR, USING LEFT INTERNAL MAMMARY ARTERY, BILATERAL GREATER SAPHENOUS VEINS HARVESTED ENDOSCOPICALLY (N/A) TRANSESOPHAGEAL ECHOCARDIOGRAM (TEE) (N/A) CIRC ARREST AND REPLACEMENT OF ASCENDING THORACIC  ANEURYSM (N/A)  Total Length of Stay:  LOS: 25 days  BP 130/83 mmHg  Pulse 90  Temp(Src) 96.5 F (35.8 C) (Oral)  Resp 20  Ht 5\' 9"  (1.753 m)  Wt 162 lb 14.7 oz (73.9 kg)  BMI 24.05 kg/m2  SpO2 92%  .Intake/Output      03/15 0701 - 03/16 0700   P.O. 150   Total Intake(mL/kg) 150 (2)   Urine (mL/kg/hr) 360 (0.4)   Stool 0 (0)   Total Output 360   Net -210       Stool Occurrence 1 x         Lab Results  Component Value Date   WBC 17.5* 01/20/2016   HGB 11.6* 01/20/2016   HCT 38.4* 01/20/2016   PLT 93* 01/20/2016   GLUCOSE 114* 01/20/2016   CHOL 157 12/27/2015   TRIG 48 12/27/2015   HDL 30* 12/27/2015   LDLCALC 117* 12/27/2015   ALT 255* 01/20/2016   AST 68* 01/20/2016   NA 139 01/20/2016   K 4.4 01/20/2016   CL 92* 01/20/2016   CREATININE 1.43* 01/20/2016   BUN 48* 01/20/2016   CO2 34* 01/20/2016   TSH 2.395 01/04/2016   INR 1.49 01/20/2016   HGBA1C  05/28/2009    5.7 (NOTE) The ADA recommends the following therapeutic goal for glycemic control related to Hgb A1c measurement: Goal of therapy: <6.5 Hgb A1c  Reference: American Diabetes Association: Clinical Practice Recommendations 2010, Diabetes Care, 2010, 33: (Suppl  1).   Dg Chest Port 1 View  01/20/2016  CLINICAL DATA:  65 year old male with a history of CABG. Patient has had coronary artery bypass graft x 4, aortic valve replacement, ascending aorta hemi arch replacement with 30 mm Hemashield graft, 01/07/2016  EXAM: PORTABLE CHEST 1 VIEW COMPARISON:  01/18/2016 FINDINGS: Cardiomediastinal silhouette unchanged in size and contour. Surgical changes of median sternotomy and CABG, with aortic valve repair/ ascending aorta repair. Similar appearance of bibasilar opacities obscuring the hemidiaphragm on the left and the right, as well as the left and right heart border. No pneumothorax. Interval removal of left subclavian central catheter. IMPRESSION: Similar appearance of the prior chest x-ray, with bilateral small pleural effusions and associated atelectasis. Surgical changes of median sternotomy and CABG, with aortic valve repair/ascending aorta replacement. Interval removal of left subclavian central catheter. Signed, Yvone NeuJaime S. Loreta AveWagner, DO [facility] Electronically Signed   By: Gilmer MorJaime  Wagner D.O.   On: 01/20/2016 07:52   Slow improvement, some return of effusions   Delight OvensEdward B Damire Remedios MD  Beeper [phone] Office [phone] 01/20/2016 7:10 PM

## 2016-01-20 NOTE — Progress Notes (Signed)
Elink was notified about pt soft BP. Agreed to continue monitoring pt due to map greater than 65. Pt is alert, oriented, and following commands. Urine output WNL. This RN will continue to monitor pt closely.

## 2016-01-20 NOTE — Progress Notes (Signed)
13 Days Post-Op Procedure(s) (LRB): CORONARY ARTERY BYPASS GRAFTING (CABG), ON PUMP, TIMES FOUR, USING LEFT INTERNAL MAMMARY ARTERY, BILATERAL GREATER SAPHENOUS VEINS HARVESTED ENDOSCOPICALLY (N/A) TRANSESOPHAGEAL ECHOCARDIOGRAM (TEE) (N/A) CIRC ARREST AND REPLACEMENT OF ASCENDING THORACIC  ANEURYSM (N/A) Subjective:  Feels better, slept some  Objective: Vital signs in last 24 hours: Temp:  [97 F (36.1 C)-97.6 F (36.4 C)] 97.6 F (36.4 C) (03/15 0700) Pulse Rate:  [25-100] 92 (03/15 0700) Cardiac Rhythm:  [-] Atrial fibrillation (03/15 0400) Resp:  [10-30] 20 (03/15 0700) BP: (76-150)/(58-112) 109/78 mmHg (03/15 0700) SpO2:  [83 %-100 %] 98 % (03/15 0756) Weight:  [73.9 kg (162 lb 14.7 oz)] 73.9 kg (162 lb 14.7 oz) (03/15 0500)  Hemodynamic parameters for last 24 hours:    Intake/Output from previous day: 03/14 0701 - 03/15 0700 In: 1072 [P.O.:1072] Out: 890 [Urine:890] Intake/Output this shift:    General appearance: alert, cooperative and more interactive this am Heart: irregularly irregular rhythm Lungs: diminished breath sounds LLL and RLL Abdomen: soft, non-tender; bowel sounds normal; no masses,  no organomegaly Extremities: edema moderate in legs Wound: incisions ok  Lab Results:  Recent Labs  01/19/16 0246 01/20/16 0225  WBC 17.4* 17.5*  HGB 11.0* 11.6*  HCT 36.0* 38.4*  PLT 67* 93*   BMET:  Recent Labs  01/19/16 0246 01/20/16 0225  NA 140 139  K 4.2 4.4  CL 94* 92*  CO2 35* 34*  GLUCOSE 112* 114*  BUN 42* 48*  CREATININE 1.42* 1.43*  CALCIUM 8.2* 8.5*    PT/INR:  Recent Labs  01/20/16 0225  LABPROT 18.1*  INR 1.49   ABG    Component Value Date/Time   PHART 7.366 01/12/2016 1529   HCO3 33.4* 01/12/2016 1529   TCO2 35 01/12/2016 1529   ACIDBASEDEF 3.0* 01/11/2016 0728   O2SAT 86.0 01/12/2016 1529   CBG (last 3)   Recent Labs  01/19/16 0004 01/19/16 0358 01/19/16 0737  GLUCAP 97 94 83    Assessment/Plan: S/P  Procedure(s) (LRB): CORONARY ARTERY BYPASS GRAFTING (CABG), ON PUMP, TIMES FOUR, USING LEFT INTERNAL MAMMARY ARTERY, BILATERAL GREATER SAPHENOUS VEINS HARVESTED ENDOSCOPICALLY (N/A) TRANSESOPHAGEAL ECHOCARDIOGRAM (TEE) (N/A) CIRC ARREST AND REPLACEMENT OF ASCENDING THORACIC  ANEURYSM (N/A)  He is hemodynamically stable in rate controlled atrial fibrillation. He did drop his BP the last two nights after pm lopressor dose and Xanax.  Continue coumadin for atrial fib  Postop sepsis resolved. He still has leukocytosis and is at risk for pneumonia with severe COPD, moderate bilateral pleural effusions and atelectasis. Continue pulmonary therapy per CCM, IS.  Thrombocytopenia: resolving  Shock liver: resolving  Encourage nutrition  Physical therapy for mobilization   LOS: 25 days    Alleen BorneBryan K Bartle 01/20/2016

## 2016-01-21 ENCOUNTER — Inpatient Hospital Stay (HOSPITAL_COMMUNITY): Payer: Medicaid Other

## 2016-01-21 DIAGNOSIS — J90 Pleural effusion, not elsewhere classified: Secondary | ICD-10-CM

## 2016-01-21 LAB — POCT I-STAT, CHEM 8
BUN: 47 mg/dL — ABNORMAL HIGH (ref 6–20)
CHLORIDE: 90 mmol/L — AB (ref 101–111)
CREATININE: 1.5 mg/dL — AB (ref 0.61–1.24)
Calcium, Ion: 1.16 mmol/L (ref 1.13–1.30)
Glucose, Bld: 113 mg/dL — ABNORMAL HIGH (ref 65–99)
HEMATOCRIT: 42 % (ref 39.0–52.0)
HEMOGLOBIN: 14.3 g/dL (ref 13.0–17.0)
POTASSIUM: 5.5 mmol/L — AB (ref 3.5–5.1)
Sodium: 138 mmol/L (ref 135–145)
TCO2: 41 mmol/L (ref 0–100)

## 2016-01-21 LAB — BASIC METABOLIC PANEL
ANION GAP: 7 (ref 5–15)
Anion gap: 7 (ref 5–15)
BUN: 47 mg/dL — ABNORMAL HIGH (ref 6–20)
BUN: 47 mg/dL — ABNORMAL HIGH (ref 6–20)
CALCIUM: 8.7 mg/dL — AB (ref 8.9–10.3)
CHLORIDE: 93 mmol/L — AB (ref 101–111)
CO2: 38 mmol/L — AB (ref 22–32)
CO2: 39 mmol/L — AB (ref 22–32)
CREATININE: 1.34 mg/dL — AB (ref 0.61–1.24)
CREATININE: 1.33 mg/dL — AB (ref 0.61–1.24)
Calcium: 8 mg/dL — ABNORMAL LOW (ref 8.9–10.3)
Chloride: 94 mmol/L — ABNORMAL LOW (ref 101–111)
GFR calc Af Amer: >60 mL/min (ref >60)
GFR calc non Af Amer: 54 mL/min — ABNORMAL LOW (ref >60)
GFR calc non Af Amer: 55 mL/min — ABNORMAL LOW (ref >60)
GLUCOSE: 119 mg/dL — AB (ref 65–99)
Glucose, Bld: 146 mg/dL — ABNORMAL HIGH (ref 65–99)
POTASSIUM: 4.4 mmol/L (ref 3.5–5.1)
Potassium: 6.7 mmol/L (ref 3.5–5.1)
Sodium: 139 mmol/L (ref 135–145)
Sodium: 139 mmol/L (ref 135–145)

## 2016-01-21 LAB — CBC
HCT: 39.3 % (ref 39.0–52.0)
HEMOGLOBIN: 11.7 g/dL — AB (ref 13.0–17.0)
MCH: 30 pg (ref 26.0–34.0)
MCHC: 29.8 g/dL — AB (ref 30.0–36.0)
MCV: 100.8 fL — ABNORMAL HIGH (ref 78.0–100.0)
Platelets: 109 10*3/uL — ABNORMAL LOW (ref 150–400)
RBC: 3.9 MIL/uL — ABNORMAL LOW (ref 4.22–5.81)
RDW: 18.9 % — ABNORMAL HIGH (ref 11.5–15.5)
WBC: 17.1 10*3/uL — ABNORMAL HIGH (ref 4.0–10.5)

## 2016-01-21 LAB — CULTURE, BODY FLUID-BOTTLE: CULTURE: NO GROWTH

## 2016-01-21 LAB — URINE CULTURE
Culture: >=100000
SPECIAL REQUESTS: NORMAL

## 2016-01-21 LAB — PROTIME-INR
INR: 1.78 — ABNORMAL HIGH (ref 0.00–1.49)
PROTHROMBIN TIME: 20.7 s — AB (ref 11.6–15.2)

## 2016-01-21 LAB — POTASSIUM: Potassium: 7.2 mmol/L (ref 3.5–5.1)

## 2016-01-21 LAB — CULTURE, BODY FLUID W GRAM STAIN -BOTTLE

## 2016-01-21 LAB — PHOSPHORUS: PHOSPHORUS: 4.1 mg/dL (ref 2.5–4.6)

## 2016-01-21 LAB — MAGNESIUM: Magnesium: 2 mg/dL (ref 1.7–2.4)

## 2016-01-21 MED ORDER — SODIUM POLYSTYRENE SULFONATE 15 GM/60ML PO SUSP
30.0000 g | Freq: Once | ORAL | Status: AC
  Administered 2016-01-21: 30 g via ORAL
  Filled 2016-01-21: qty 120

## 2016-01-21 MED ORDER — FENTANYL CITRATE (PF) 100 MCG/2ML IJ SOLN: 12.5000 ug | INTRAMUSCULAR | Status: DC | PRN

## 2016-01-21 MED ORDER — FENTANYL CITRATE (PF) 100 MCG/2ML IJ SOLN
12.5000 ug | Freq: Once | INTRAMUSCULAR | Status: DC
  Filled 2016-01-21: qty 2

## 2016-01-21 NOTE — Progress Notes (Signed)
eLink Physician-Brief Progress Note Patient Name: Craig FeinsteinRobert S [name] DOB: 11/18/1950 MRN: [id number]   Date of Service  01/21/2016  HPI/Events of Note  Hyperkalemia  K=7.2  eICU Interventions  Kayexalate 30g Recheck BMP May need D50 and insulin if worsen     Intervention Category Major Interventions: Electrolyte abnormality - evaluation and management  Julienne Vogler 01/21/2016, 5:46 AM

## 2016-01-21 NOTE — CV Procedure (Signed)
  Cardiothoracic Surgery procedure note:   Bilateral Thoracentesis   After informed consent obtained, the patient was positioned sitting on the side of the bed. The back was prepped with choroprep and draped using sterile technique. A time out was taken and the proper patient, proper side, and proper procedure were confirmed. 1% lidocaine was injected into the skin and subcutaneous tissue of the left lower back down to the pleural space. Upon entering the pleural space there was light serosanguinous fluid. The thoracentesis catheter was inserted and connected to a syringe/bag system.  1800 cc of serosanguinous fluid was removed without difficulty. The catheter was removed and a bandaid applied. Then the procedure was repeated on the right side and 1400 cc of serosanguinous fluid removed. CXR was ordered. He tolerated the procedure well.

## 2016-01-21 NOTE — Progress Notes (Signed)
14 Days Post-Op Procedure(s) (LRB): CORONARY ARTERY BYPASS GRAFTING (CABG), ON PUMP, TIMES FOUR, USING LEFT INTERNAL MAMMARY ARTERY, BILATERAL GREATER SAPHENOUS VEINS HARVESTED ENDOSCOPICALLY (N/A) TRANSESOPHAGEAL ECHOCARDIOGRAM (TEE) (N/A) CIRC ARREST AND REPLACEMENT OF ASCENDING THORACIC  ANEURYSM (N/A) Subjective:  More short of breath this am  Objective: Vital signs in last 24 hours: Temp:  [96.5 F (35.8 C)-97.7 F (36.5 C)] 96.5 F (35.8 C) (03/16 1148) Pulse Rate:  [52-106] 91 (03/16 1000) Cardiac Rhythm:  [-] Atrial fibrillation (03/16 0800) Resp:  [12-26] 18 (03/16 1000) BP: (91-149)/(67-101) 95/80 mmHg (03/16 1000) SpO2:  [85 %-100 %] 100 % (03/16 1000) Weight:  [74.3 kg (163 lb 12.8 oz)] 74.3 kg (163 lb 12.8 oz) (03/16 0500)  Hemodynamic parameters for last 24 hours:    Intake/Output from previous day: 03/15 0701 - 03/16 0700 In: 470 [P.O.:470] Out: 710 [Urine:710] Intake/Output this shift: Total I/O In: 3 [I.V.:3] Out: -   General appearance: fatigued and increased work of breathing Neurologic: intact Heart: irregularly irregular rhythm Lungs: diminished breath sounds LLL and RLL Abdomen: soft, non-tender; bowel sounds normal; no masses,  no organomegaly Extremities: edema moderate Wound: chest incision ok, some serous drainage from leg incision.  Lab Results:  Recent Labs  01/20/16 0225 01/21/16 0223 01/21/16 0849  WBC 17.5* 17.1*  --   HGB 11.6* 11.7* 14.3  HCT 38.4* 39.3 42.0  PLT 93* 109*  --    BMET:  Recent Labs  01/20/16 0225 01/21/16 0223 01/21/16 0434 01/21/16 0849  NA 139 139  --  138  K 4.4 6.7* 7.2* 5.5*  CL 92* 94*  --  90*  CO2 34* 38*  --   --   GLUCOSE 114* 119*  --  113*  BUN 48* 47*  --  47*  CREATININE 1.43* 1.34*  --  1.50*  CALCIUM 8.5* 8.7*  --   --     PT/INR:  Recent Labs  01/21/16 0223  LABPROT 20.7*  INR 1.78*   ABG    Component Value Date/Time   PHART 7.366 01/12/2016 1529   HCO3 33.4* 01/12/2016  1529   TCO2 41 01/21/2016 0849   ACIDBASEDEF 3.0* 01/11/2016 0728   O2SAT 86.0 01/12/2016 1529   CBG (last 3)   Recent Labs  01/19/16 0004 01/19/16 0358 01/19/16 0737  GLUCAP 97 94 83    Assessment/Plan: S/P Procedure(s) (LRB): CORONARY ARTERY BYPASS GRAFTING (CABG), ON PUMP, TIMES FOUR, USING LEFT INTERNAL MAMMARY ARTERY, BILATERAL GREATER SAPHENOUS VEINS HARVESTED ENDOSCOPICALLY (N/A) TRANSESOPHAGEAL ECHOCARDIOGRAM (TEE) (N/A) CIRC ARREST AND REPLACEMENT OF ASCENDING THORACIC  ANEURYSM (N/A)  Hemodynamically stable in rate controlled atrial fibrillation.  Respiratory status worse today. CXR shows at least moderate to large pleural effusions bilaterally. Plan bilateral thoracentesis this am.  Renal function stable. K+ came back 6.7 this am but BMET otherwise unchanged with stable renal function. He was given 2 doses of kayexalate. Repeat K+ 5.5. He has not been getting replacement so I don't know why his K+ is up. Renal function stable and not acidotic. Will follow.  Continue to encourage nutrition  Physical therapy.     LOS: 26 days    Alleen BorneBryan K Dezi Brauner 01/21/2016

## 2016-01-21 NOTE — Progress Notes (Signed)
Patient ID: Craig FeinsteinRobert S [name], male   DOB: 10/02/1951, 65 y.o.   MRN: [id number]   SICU Evening Rounds:   Hemodynamically stable   Breathing better today after thoracenteses but has had persistent diarrhea after two doses of kayexalate this am.  Urine output ok   CBC    Component Value Date/Time   WBC 17.1* 01/21/2016 0223   RBC 3.90* 01/21/2016 0223   HGB 14.3 01/21/2016 0849   HCT 42.0 01/21/2016 0849   PLT 109* 01/21/2016 0223   MCV 100.8* 01/21/2016 0223   MCH 30.0 01/21/2016 0223   MCHC 29.8* 01/21/2016 0223   RDW 18.9* 01/21/2016 0223   LYMPHSABS 1.4 01/13/2016 0347   MONOABS 1.1* 01/13/2016 0347   EOSABS 0.0 01/13/2016 0347   BASOSABS 0.0 01/13/2016 0347     BMET    Component Value Date/Time   NA 139 01/21/2016 1257   K 4.4 01/21/2016 1257   CL 93* 01/21/2016 1257   CO2 39* 01/21/2016 1257   GLUCOSE 146* 01/21/2016 1257   BUN 47* 01/21/2016 1257   CREATININE 1.33* 01/21/2016 1257   CALCIUM 8.0* 01/21/2016 1257   GFRNONAA 55* 01/21/2016 1257   GFRAA >60 01/21/2016 1257     A/P:  Stable postop course. Continue current plans

## 2016-01-21 NOTE — Care Management Note (Addendum)
  Patient Details  Name: Craig [name] MRN: [id number] Date of Birth: 07/08/1951  Subjective/Objective:        Pt admitted with Acute Respiratory Failure    Action/Plan:   01/21/2016  Craig [name] -  FC will come to pts room tomorrow tentatively 10am to facilitate Medicaid application, Craig [name] also arranged for disability consideration via the service center.  Sister made aware.  CM received call from family member; family stated pts requesting discharge to sisters home with 24 hour care provided by sister if not accepted into CIR.  CM went back to speak with pt; pt now concurs that he would prefer to discharge to sisters care if not accepted into CIR.  CM spoke with PT prior to tentative therapy session;  Per last completed session of PT evaluation of pt; at this point recommendation would be for SNF if unable to discharge to CIR,   PT will continue to follow as discharge needs may change as pt progresses,  Pt declined PT evaluation for today.  CM contacted financial consuelor Craig [name] [phone] to inquire if pt has been reassessed for medicaid eligibility post surgery - left voicemail.  CM spoke with pt regarding recommended discharge plan CIR vs SNF; pt did not voice concerns to either option,  Correction to previous entry; son'[name] name is Craig [name] not [name].  CM will continue to monitor  01/20/16 Pt still experiencing respiratory issues and remains in ICU - [facility] for 24 hours.  Recommendations have been placed for CIR.  CSW consulted for SNF backup plan.  CM will continue to monitor for disposition needs  01/08/16 Pt is [name]  01/06/16 Pt is independent from home with son Craig [name].  Pt is without insurance; will need PCP initiated at [facility] clinic prior to discharge. CM will continue to monitor for disposition needs   Expected Discharge Date:                  Expected Discharge Plan:  Home/Self Care  In-House Referral:     Discharge planning Services   CM Consult  Post Acute Care Choice:    Choice offered to:     DME Arranged:    DME Agency:     HH Arranged:    HH Agency:     Status of Service:  In process, will continue to follow  Medicare Important Message Given:    Date Medicare IM Given:    Medicare IM give by:    Date Additional Medicare IM Given:    Additional Medicare Important Message give by:     If discussed at Long Length of Stay Meetings, dates discussed:  01/05/16, 01/07/16  Additional Comments:  01/06/16- 1145- Craig PieriniKristi Webster RN, BSN- plan for CABG/AVR tomorrow 3/2- CM will follow post op for d/c needs  Craig [name], Craig Deininger S, RN 01/21/2016, 1:55 PM

## 2016-01-21 NOTE — Progress Notes (Signed)
PT Cancellation Note  Patient Details Name: Craig FeinsteinRobert S [place] MRN: [id number] DOB: 03/27/1951   Cancelled Treatment:    Reason Eval/Treat Not Completed: Patient declined, no reason specified Pt reports needing to rest this PM and declining therapy. Expressed importance of mobility and getting OOB. Will follow up.   Blake DivineShauna A Hitesh Fouche 01/21/2016, 1:41 PM Mylo RedShauna Demian Maisel, PT, DPT [phone]

## 2016-01-21 NOTE — Progress Notes (Signed)
PCCM PROGRESS NOTE  ADMISSION DATE: 12/26/2015 CONSULT DATE: 12/27/2015 REFERRING PROVIDER: [facility]  CC: Short of breath     CULTURES: 2/19 Respiratory viral panel >> negative 2/19 Sputum >> oral flora 2/19 Pneumococcal ag >> negative 3/06 Blood >> NTD  ANTIBIOTICS: 2/18 Zithromax >> 2/21 2/18 Rocephin >> 2/24 3/06 Imipenem >>3/12 3/06 Vancomycin >>3/12   LINES/TUBES: 2/19 ETT >> 2/19 (self extubated) 3/02 ETT >> 3/03 3/02 Rt radial a line >>3/04 3/02 Rt IJ introducer >>3/04 3/06 ETT >> 3/07 3/06 Lt  CVL 3/06 >> 3/06 Lt femoral CVL >> 3/08   TUDIES:  EVENTS: 2/18 Admit, cardiology consulted 2/18 CT chest >> emphysema, consolidation RLL 2/19 To ICU, VDRF 2/21 Rt/Lt heart cath, TCTS consulted 2/21 Rt/Lt heart cath, TCTS consulted 2/20 Echo >> EF 35 to 40%, severe AS, 42 mm aortic root, mild MR 2/26 - PAF 2/27 PFT >> FEV1 1.26 (37%), FEV1% 34, TLC 6.17 (90%), DLCO 26%, no BD 3/02 CABG, AVR, replacement of ascending thoracic aortic aneurysm 3/03 Transfuse PRBC 3/06 resp failure, lactic acidosis, ARF, shock liver, ETT 3/06 Echo >> normal LV/RV systolic fx 3/06 Abd u/s >> GB wall thickening w/o gallstones 3/06 CT abd/pelvis >> GB sludge, non obstructive renal stones b/l, diverticulosis 3/06 CT chest >> b/l effusions, centrilobular and paraseptal emphysema, compressive ATX 3/08 Off pressors 3/11 Rt thoracentesis >> 1400 ml fluid 3/14 remains very weak.    SUBJECTIVE: 3/16 - bialteral thora 1400-1800cc Feels better but chest sore. Overal weak. Sister at bedside.   VITAL SIGNS: BP 95/80 mmHg  Pulse 91  Temp(Src) 97.5 F (36.4 C) (Oral)  Resp 18  Ht 5\' 9"  (1.753 m)  Wt 74.3 kg (163 lb 12.8 oz)  BMI 24.18 kg/m2  SpO2 100%  INTAKE/OUTPUT: I/O last 3 completed shifts: In: 770 [P.O.:770] Out: 1195 [Urine:1195]  General: sitting up in bedir, comfortable LOOKS VERY DECONDITIONED. HEENT: no stridor, [place]/AT, PERRL, EOM-I and MMM. Cardiac: RRR, 2/6 SM Chest:  decreased BS at bases, no wheeze but air movement is not great. Abd: soft, mildly tender, ND and +BS Ext: no edema and -tenderness. Neuro: alert, follows commands Skin: no rashes   PULMONARY  Recent Labs Lab 01/21/16 0849  TCO2 41    CBC  Recent Labs Lab 01/19/16 0246 01/20/16 0225 01/21/16 0223 01/21/16 0849  HGB 11.0* 11.6* 11.7* 14.3  HCT 36.0* 38.4* 39.3 42.0  WBC 17.4* 17.5* 17.1*  --   PLT 67* 93* 109*  --     COAGULATION  Recent Labs Lab 01/17/16 0430 01/18/16 0500 01/19/16 0246 01/20/16 0225 01/21/16 0223  INR 1.51* 1.55* 1.46 1.49 1.78*    CARDIAC  No results for input(s): TROPONINI in the last 168 hours. No results for input(s): PROBNP in the last 168 hours.   CHEMISTRY  Recent Labs Lab 01/17/16 1245 01/18/16 0500 01/19/16 0246 01/20/16 0225 01/21/16 0223 01/21/16 0434 01/21/16 0849  NA 137 139 140 139 139  --  138  K 3.4* 3.8 4.2 4.4 6.7* 7.2* 5.5*  CL 92* 94* 94* 92* 94*  --  90*  CO2 34* 37* 35* 34* 38*  --   --   GLUCOSE 90 102* 112* 114* 119*  --  113*  BUN 42* 40* 42* 48* 47*  --  47*  CREATININE 1.21 1.19 1.42* 1.43* 1.34*  --  1.50*  CALCIUM 8.2* 8.2* 8.2* 8.5* 8.7*  --   --   MG  --   --  1.8 2.1 2.0  --   --  PHOS  --   --  4.1 4.3 4.1  --   --    Estimated Creatinine Clearance: 49.8 mL/min (by C-G formula based on Cr of 1.5).   LIVER  Recent Labs Lab 01/15/16 0410  01/17/16 0430 01/18/16 0500 01/19/16 0246 01/20/16 0225 01/21/16 0223  AST 463*  --   --  95* 77* 68*  --   ALT 1876*  --   --  462* 323* 255*  --   ALKPHOS 103  --   --  93 96 100  --   BILITOT 3.4*  --   --  3.7* 3.7* 2.3*  --   PROT 5.1*  --   --  5.0* 5.1* 5.4*  --   ALBUMIN 2.9*  --   --  2.5* 2.5* 2.6*  --   INR 1.56*  < > 1.51* 1.55* 1.46 1.49 1.78*  < > = values in this interval not displayed.   INFECTIOUS  Recent Labs Lab 01/15/16 0410  PROCALCITON 1.08     ENDOCRINE CBG (last 3)   Recent Labs  01/19/16 0004  01/19/16 0358 01/19/16 0737  GLUCAP 97 94 83         IMAGING x48h  - image(s) personally visualized  -   highlighted in bold Dg Chest Port 1 View  01/21/2016  CLINICAL DATA:  Bilateral thoracentesis. EXAM: PORTABLE CHEST 1 VIEW COMPARISON:  01/21/2016 FINDINGS: Both pleural effusions are considerably improved. No pneumothorax seen. Prior median sternotomy. Interstitial accentuation in both lungs favoring the lung bases. Prosthetic aortic valve. Old healed bilateral rib fractures. Stable enlargement of the cardiopericardial silhouette IMPRESSION: 1. Prominent reduction in size of the bilateral pleural effusions. No pneumothorax seen. 2. Mild interstitial accentuation, favoring the lung bases, with cardiac enlargement, possible interstitial pulmonary edema or re-expansion edema. 3. Old bilateral rib fractures. Electronically Signed   By: Gaylyn Rong M.D.   On: 01/21/2016 09:50   Dg Chest Port 1 View  01/21/2016  CLINICAL DATA:  Post CABG EXAM: PORTABLE CHEST 1 VIEW COMPARISON:  Portable exam 0558 hours compared to 01/20/2016 FINDINGS: Rotated to the RIGHT. Enlargement of cardiac silhouette post median sternotomy. Bibasilar effusions and atelectasis. No gross infiltrate or pneumothorax. Old BILATERAL rib fractures. IMPRESSION: Enlargement of cardiac silhouette. Persistent bibasilar effusions atelectasis slightly greater on LEFT. Electronically Signed   By: Ulyses Southward M.D.   On: 01/21/2016 08:00   Dg Chest Port 1 View  01/20/2016  CLINICAL DATA:  65 year old male with a history of CABG. Patient has had coronary artery bypass graft x 4, aortic valve replacement, ascending aorta hemi arch replacement with 30 mm Hemashield graft, 01/07/2016 EXAM: PORTABLE CHEST 1 VIEW COMPARISON:  01/18/2016 FINDINGS: Cardiomediastinal silhouette unchanged in size and contour. Surgical changes of median sternotomy and CABG, with aortic valve repair/ ascending aorta repair. Similar appearance of bibasilar  opacities obscuring the hemidiaphragm on the left and the right, as well as the left and right heart border. No pneumothorax. Interval removal of left subclavian central catheter. IMPRESSION: Similar appearance of the prior chest x-ray, with bilateral small pleural effusions and associated atelectasis. Surgical changes of median sternotomy and CABG, with aortic valve repair/ascending aorta replacement. Interval removal of left subclavian central catheter. Signed, Yvone Neu. Loreta Ave, DO [facility] Radiology Electronically Signed   By: Gilmer Mor D.O.   On: 01/20/2016 07:52    I reviewed CXR myself, bilateral pleural effusion.  DISCUSSION: 65 yo male former smoker presented with fever, cough, dyspnea,  chills from pneumonia.  He was found to have troponin elevation.  He has hx of COPD/emphysema.  ASSESSMENT/PLAN:  PULMONARY A: Acute on chronic hypoxic/hypercapnic respiratory failure >> much improved after Rt thoracentesis 3/11. COPD/emphysema with exacerbation. B/l pleural effusions.   - improved resp status after bialteral thora. Having some chest soreness post thora   P: Tramadol per CVTS - - one time fentanyl 12.92mcg x  1 per PCCM x 1 now Monitor in ICU  Oxygen to keep SpO2 90 to 95%. Continue mucinex to help with sputum expulsion Continue brovana, pulmicort, spiriva PRN albuterol Ambulate as able.  CARDIOLOGY A: NSTEMI. Severe AS and severe CAD s/p CABG and AVR 3/02. Acute on chronic systolic/diastolic CHF. New shock 3/06 >> likely from sepsis. PAF >> sinus rhythm now. HLD. P: Continue lopressor Coumadin restarted. INR in AM.  RENAL A: ARF likely from ATN >> improving. Hyperkalemia >> resolved. P: Monitor renal fx, urine outpt, electrolytes Replace electrolytes as indicated D/C further diureses.  GASTROENTEROLOGY A: Nutrition. Shock liver >> LFTs trending down. P: Started regular diet and feeling better. CMET  in AM.  HEMATOLOGY A: Anemia of critical illness. Thrombocytopenia. P: F/u CBC  INFECTION A: CAP >> completed Abx 2/24. Septic shock 3/06 >> possible sources are line vs respiratory vs gallbladder >> improving. P: Day 7/7 of vancomycin, imipenem >> likely can d/c abx affter doses on 3/12 WBC is increasing however, monitor for signs of infection, no abx as afebrile.  ENDOCRINE A: Hyperglycemia. P: SSI  NEUROLOGY A: Post-op pain control. Deconditioning. P: Prn fentanyl Mobilize as able   Sister and RN updated at bedside   Dr. Kalman Shan, M.D., [facility].C.P Pulmonary and Critical Care Medicine Staff Physician [facility] [place] [facility] Pager: [phone], If no answer or between  15:00h - 7:00h: call [phone]  01/21/2016 10:41 AM

## 2016-01-21 NOTE — Progress Notes (Signed)
Nutrition Follow-up  DOCUMENTATION CODES:   Not applicable  INTERVENTION:    Continue Ensure Enlive po BID, each supplement provides 350 kcal and 20 grams of protein  NUTRITION DIAGNOSIS:   Inadequate oral intake related to poor appetite as evidenced by  (RN report), onging  GOAL:   Patient will meet greater than or equal to 90% of their needs, progressing  MONITOR:   PO intake, Supplement acceptance, Labs, Weight trends, I & O's  ASSESSMENT:   65 y.o. Male with a history of COPD who presents to the ED with complaints of Flu like Symptoms with SOB and Cough and fevers and Chills x 6 days. He had worsening SOB for the past 2 days and Chest Tightness and Chest pain since the AM. He was evalauted in the ED and was hypoxic, to 91% and placed on supplemental NCO2. He was also found to have Pneumonia of the RUL and RLL on CTA of the Chest which was without evidence of a Pulmonary Embolism. He was also found to have an elevated troponin at 2.32. He was placed on IV antibiotic rx for CAP, and Cardiology was consulted and he was referred for admission.   Patient s/p procedure 3/2: CORONARY ARTERY BYPASS GRAFT x 4  Patient extubated 3/3. S/p thoracentesis 3/12. PO intake variable at 10-50% per flowsheet records. Patient reports his appetite is doing a little better; did have one Ensure Enlive this AM. RD encouraged pt to try and drink two supplements per day.  Diet Order:  Diet heart healthy/carb modified Room service appropriate?: Yes; Fluid consistency:: Thin  Skin:  Reviewed, no issues  Last BM:  3/15  Height:   Ht Readings from Last 1 Encounters:  01/18/16 5\' 9"  (1.753 m)    Weight:   Wt Readings from Last 1 Encounters:  01/21/16 163 lb 12.8 oz (74.3 kg)    Ideal Body Weight:  72.7 kg  BMI:  Body mass index is 24.18 kg/(m^2).  Estimated Nutritional Needs:   Kcal:  2000-2200  Protein:  110-120 gm  Fluid:  per MD  EDUCATION NEEDS:   No education  needs identified at this time  Maureen ChattersKatie Cyndal Kasson, RD, LDN Pager #: [phone] After-Hours Pager #: [phone]

## 2016-01-22 ENCOUNTER — Inpatient Hospital Stay (HOSPITAL_COMMUNITY): Payer: Medicaid Other

## 2016-01-22 DIAGNOSIS — N179 Acute kidney failure, unspecified: Secondary | ICD-10-CM

## 2016-01-22 DIAGNOSIS — R5381 Other malaise: Secondary | ICD-10-CM

## 2016-01-22 DIAGNOSIS — J90 Pleural effusion, not elsewhere classified: Secondary | ICD-10-CM | POA: Insufficient documentation

## 2016-01-22 LAB — PROTIME-INR
INR: 2.06 — ABNORMAL HIGH (ref 0.00–1.49)
Prothrombin Time: 23 seconds — ABNORMAL HIGH (ref 11.6–15.2)

## 2016-01-22 LAB — COMPREHENSIVE METABOLIC PANEL
ALT: 145 U/L — AB (ref 17–63)
AST: 67 U/L — AB (ref 15–41)
Albumin: 2.2 g/dL — ABNORMAL LOW (ref 3.5–5.0)
Alkaline Phosphatase: 94 U/L (ref 38–126)
Anion gap: 11 (ref 5–15)
BILIRUBIN TOTAL: 1.4 mg/dL — AB (ref 0.3–1.2)
BUN: 49 mg/dL — AB (ref 6–20)
CALCIUM: 8.4 mg/dL — AB (ref 8.9–10.3)
CO2: 36 mmol/L — ABNORMAL HIGH (ref 22–32)
CREATININE: 1.26 mg/dL — AB (ref 0.61–1.24)
Chloride: 91 mmol/L — ABNORMAL LOW (ref 101–111)
GFR calc Af Amer: >60 mL/min (ref >60)
GFR, EST NON AFRICAN AMERICAN: 59 mL/min — AB (ref >60)
Glucose, Bld: 132 mg/dL — ABNORMAL HIGH (ref 65–99)
Potassium: 5.2 mmol/L — ABNORMAL HIGH (ref 3.5–5.1)
Sodium: 138 mmol/L (ref 135–145)
TOTAL PROTEIN: 4.8 g/dL — AB (ref 6.5–8.1)

## 2016-01-22 LAB — CBC
HCT: 37.5 % — ABNORMAL LOW (ref 39.0–52.0)
Hemoglobin: 11.9 g/dL — ABNORMAL LOW (ref 13.0–17.0)
MCH: 32 pg (ref 26.0–34.0)
MCHC: 31.7 g/dL (ref 30.0–36.0)
MCV: 100.8 fL — ABNORMAL HIGH (ref 78.0–100.0)
PLATELETS: 127 10*3/uL — AB (ref 150–400)
RBC: 3.72 MIL/uL — ABNORMAL LOW (ref 4.22–5.81)
RDW: 19.4 % — AB (ref 11.5–15.5)
WBC: 13.9 10*3/uL — AB (ref 4.0–10.5)

## 2016-01-22 LAB — PHOSPHORUS: Phosphorus: 3.1 mg/dL (ref 2.5–4.6)

## 2016-01-22 LAB — CULTURE, BODY FLUID W GRAM STAIN -BOTTLE: Culture: NO GROWTH

## 2016-01-22 LAB — CULTURE, BODY FLUID-BOTTLE

## 2016-01-22 LAB — MAGNESIUM: Magnesium: 1.9 mg/dL (ref 1.7–2.4)

## 2016-01-22 MED ORDER — FLUCONAZOLE 100 MG PO TABS
100.0000 mg | ORAL_TABLET | Freq: Every day | ORAL | Status: AC
  Administered 2016-01-22 – 2016-01-28 (×7): 100 mg via ORAL
  Filled 2016-01-22 (×13): qty 1

## 2016-01-22 MED ORDER — FUROSEMIDE 40 MG PO TABS
40.0000 mg | ORAL_TABLET | Freq: Every day | ORAL | Status: DC
  Administered 2016-01-22 – 2016-01-23 (×2): 40 mg via ORAL
  Filled 2016-01-22 (×2): qty 1

## 2016-01-22 MED ORDER — METOLAZONE 5 MG PO TABS: 5.0000 mg | ORAL_TABLET | Freq: Every day | ORAL | Status: DC

## 2016-01-22 MED ORDER — WARFARIN SODIUM 1 MG PO TABS
1.0000 mg | ORAL_TABLET | Freq: Every day | ORAL | Status: DC
  Administered 2016-01-22: 1 mg via ORAL
  Filled 2016-01-22 (×2): qty 1

## 2016-01-22 NOTE — Progress Notes (Signed)
Physical Therapy Treatment Patient Details Name: Craig [name] MRN: [id number] DOB: Sep 30, 1951 Today's Date: 01/22/2016    History of Present Illness Patient is a 65 y/o male with hx of COPD, HTN, HLD, alcohol abuse and tobacco abuse presents with CAP, NSTEMI and intubated due to respiratory failure 2/19-2/20, s/p CABG x4 on 3/2 and s/p bilateral thoracentesis 3/10-11. Continues to have tenuous respiratory status secondary to severe COPD, bilateral pleural effusions and atelectasis.      PT Comments    Patient participating this session with encouragement from family in room as well as PT.  Feel if unable to participate more may need home with HHPT and 24 hour assist, but encouraged to try to ramp up participation to allow for CIR due to severe deconditioning.  Will continue to follow acutely.  Follow Up Recommendations  CIR     Equipment Recommendations  Rolling walker with 5" wheels    Recommendations for Other Services       Precautions / Restrictions Precautions Precautions: Fall;Sternal    Mobility  Bed Mobility               General bed mobility comments: Sitting in chair upon PT arrival.   Transfers Overall transfer level: Needs assistance Equipment used: Rolling walker (2 wheeled) Transfers: Sit to/from Stand Sit to Stand: Mod assist;+2 physical assistance         General transfer comment: assist for up from low recliner cues and assist for holding pillow for sternal precautions  Ambulation/Gait Ambulation/Gait assistance: Mod assist;+2 safety/equipment (chair following) Ambulation Distance (Feet): 4 Feet Assistive device: Rolling walker (2 wheeled) Gait Pattern/deviations: Step-to pattern;Shuffle;Decreased stride length     General Gait Details: encouraged to take more steps, but c/o fear of falling due to numbness, tingling and pain in feet   Stairs            Wheelchair Mobility    Modified Rankin (Stroke Patients Only)        Balance   Sitting-balance support: Feet supported Sitting balance-Leahy Scale: Fair     Standing balance support: Bilateral upper extremity supported Standing balance-Leahy Scale: Poor Standing balance comment: relies on UE support for balance due to LE weakness, numbness and tingling                    Cognition Arousal/Alertness: Awake/alert Behavior During Therapy: WFL for tasks assessed/performed Overall Cognitive Status: Within Functional Limits for tasks assessed                      Exercises General Exercises - Lower Extremity Ankle Circles/Pumps: AROM;Both;5 reps;Seated Short Arc Quad: AROM;Both;5 reps;Seated Heel Slides: AAROM;Both;5 reps;Seated Hip ABduction/ADduction: AROM;5 reps;Both;Seated Other Exercises Other Exercises: performed manual lymphatic drainage technique for LE's due pt c/o swelling impacting ambulation    General Comments        Pertinent Vitals/Pain Faces Pain Scale: Hurts whole lot Pain Location: feet and legs with weight bearing Pain Descriptors / Indicators: Tightness Pain Intervention(s): Monitored during session;Limited activity within patient's tolerance;Other (comment) (massage/lymphatic drainage)    Home Living                      Prior Function            PT Goals (current goals can now be found in the care plan section) Progress towards PT goals: Not progressing toward goals - comment (due to LE weakness/tingling)    Frequency  PT Plan Current plan remains appropriate    Co-evaluation             End of Session Equipment Utilized During Treatment: Gait belt;Oxygen Activity Tolerance: Patient limited by fatigue;Patient limited by pain Patient left: in chair;with family/visitor present;with call bell/phone within reach     Time: 1135-1200 PT Time Calculation (min) (ACUTE ONLY): 25 min  Charges:  $Gait Training: 8-22 mins $Therapeutic Exercise: 8-22 mins                    G  Codes:      Elray McgregorCynthia Wynn 01/22/2016, 4:59 PM  Sheran Lawlessyndi Wynn, PT [phone] 01/22/2016

## 2016-01-22 NOTE — Progress Notes (Signed)
15 Days Post-Op Procedure(s) (LRB): CORONARY ARTERY BYPASS GRAFTING (CABG), ON PUMP, TIMES FOUR, USING LEFT INTERNAL MAMMARY ARTERY, BILATERAL GREATER SAPHENOUS VEINS HARVESTED ENDOSCOPICALLY (N/A) TRANSESOPHAGEAL ECHOCARDIOGRAM (TEE) (N/A) CIRC ARREST AND REPLACEMENT OF ASCENDING THORACIC  ANEURYSM (N/A) Subjective:  Feels better today. Slept some last night  Objective: Vital signs in last 24 hours: Temp:  [96.5 F (35.8 C)-98 F (36.7 C)] 98 F (36.7 C) (03/17 0730) Pulse Rate:  [31-98] 98 (03/17 0821) Cardiac Rhythm:  [-] Atrial fibrillation (03/17 0800) Resp:  [12-24] 20 (03/17 1000) BP: (84-118)/(64-85) 104/72 mmHg (03/17 1000) SpO2:  [93 %-100 %] 100 % (03/17 0821) Weight:  [71.8 kg (158 lb 4.6 oz)] 71.8 kg (158 lb 4.6 oz) (03/17 0500)  Hemodynamic parameters for last 24 hours:    Intake/Output from previous day: 03/16 0701 - 03/17 0700 In: 923 [P.O.:920; I.V.:3] Out: 475 [Urine:475] Intake/Output this shift: Total I/O In: 3 [I.V.:3] Out: -   General appearance: alert and cooperative Neurologic: intact Heart: irregularly irregular rhythm Lungs: diminished breath sounds bibasilar Abdomen: soft, non-tender; bowel sounds normal; no masses,  no organomegaly Extremities: edema moderate bilateral leg edema Wound: chest incisions healing well. Leg incision ok with some separation.  Lab Results:  Recent Labs  01/21/16 0223 01/21/16 0849 01/22/16 0513  WBC 17.1*  --  13.9*  HGB 11.7* 14.3 11.9*  HCT 39.3 42.0 37.5*  PLT 109*  --  127*   BMET:  Recent Labs  01/21/16 1257 01/22/16 0513  NA 139 138  K 4.4 5.2*  CL 93* 91*  CO2 39* 36*  GLUCOSE 146* 132*  BUN 47* 49*  CREATININE 1.33* 1.26*  CALCIUM 8.0* 8.4*    PT/INR:  Recent Labs  01/22/16 0513  LABPROT 23.0*  INR 2.06*   ABG    Component Value Date/Time   PHART 7.366 01/12/2016 1529   HCO3 33.4* 01/12/2016 1529   TCO2 41 01/21/2016 0849   ACIDBASEDEF 3.0* 01/11/2016 0728   O2SAT 86.0  01/12/2016 1529   CBG (last 3)  No results for input(s): GLUCAP in the last 72 hours.  CLINICAL DATA: Status post aortic valve repair.  EXAM: PORTABLE CHEST 1 VIEW  COMPARISON: 01/21/2016  FINDINGS: Prior CABG. Heart is borderline in size. Small bilateral pleural effusions with bibasilar airspace opacities, both increasing since prior study. Mild vascular congestion. Old bilateral rib fractures. No acute bony abnormality.  IMPRESSION: Worsening bibasilar atelectasis or infiltrates with small effusions.   Electronically Signed  By: Charlett Nose M.D.  On: 01/22/2016 08:11  Assessment/Plan: S/P Procedure(s) (LRB): CORONARY ARTERY BYPASS GRAFTING (CABG), ON PUMP, TIMES FOUR, USING LEFT INTERNAL MAMMARY ARTERY, BILATERAL GREATER SAPHENOUS VEINS HARVESTED ENDOSCOPICALLY (N/A) TRANSESOPHAGEAL ECHOCARDIOGRAM (TEE) (N/A) CIRC ARREST AND REPLACEMENT OF ASCENDING THORACIC  ANEURYSM (N/A)  He is hemodynamically stable but BP is on the lower end of normal. Will stop lopressor. With his severe COPD he is probably better off without it anyway. He continues to be in atrial fibrillation with good rate control in the 90's for the most part. If his rate gets up will use digoxin for rate control.  Respiratory status much better after bilateral thoracentesis yesterday. His CXR afterward showed complete drainage. CXR this am shows some small recurrence of the effusions and mild atelectasis. If they recur to moderate degree then will have to insert pleurX catheters.  Renal function stable with decreasing creat. He has significant total body fluid overload with weight 19 lbs over preop but most of that is 3rd space due to low  albumin. Will start diuretic daily to effect slow diuresis to avoid intravascular depletion and worsening renal function.   Encourage nutrition and IS, PT.   CIR consult. He was very active prior to admission and hopefully can get back to that level.   Urine culture  growing > 100K colonies of yeast. Will treat with Diflucan for 7 days and adjust coumadin dose downward accordingly. Foley is out.  Leukocytosis improving after thoracentesis. May have been due to atelectasis and impending pneumonia.    LOS: 27 days    Alleen BorneBryan K Brentney Goldbach 01/22/2016

## 2016-01-22 NOTE — Progress Notes (Signed)
I will follow up on Monday to assist with planning dispo when pt medically ready. [phone]

## 2016-01-22 NOTE — Progress Notes (Signed)
TCTS BRIEF SICU PROGRESS NOTE  15 Days Post-Op  S/P Procedure(s) (LRB): CORONARY ARTERY BYPASS GRAFTING (CABG), ON PUMP, TIMES FOUR, USING LEFT INTERNAL MAMMARY ARTERY, BILATERAL GREATER SAPHENOUS VEINS HARVESTED ENDOSCOPICALLY (N/A) TRANSESOPHAGEAL ECHOCARDIOGRAM (TEE) (N/A) CIRC ARREST AND REPLACEMENT OF ASCENDING THORACIC  ANEURYSM (N/A)   Stable day.  Remains very deconditioned Rate controlled Afib w/ stable BP O2 sats 94% on 1 L/min UOP adequate  Plan: Continue current plan  Purcell Nailslarence H Owen, MD 01/22/2016 5:51 PM

## 2016-01-22 NOTE — Progress Notes (Signed)
UR Completed. Elanora Quin, RN, BSN.  [phone] 

## 2016-01-22 NOTE — Consult Note (Signed)
Physical Medicine and Rehabilitation Consult Reason for Consult: Debilitation/respiratory failure/NSTEMI Referring Physician: Dr. Laneta Simmers   HPI: Craig [name] is a 65 y.o. right handed male with history of COPD/tobacco and alcohol abuse. By chart review patient lives with son in [place]. Independent prior to admission and retired. Plans to stay with his sister on discharge. Presented 12/26/2015 with flulike symptoms including shortness of breath, cough and low-grade fever as well as chills 2 days. Nonspecific chest tightness. He was evaluated in the ED found to be hypoxic placed on supplemental oxygen. Chest x-ray showed right upper lobe pneumonia. CT angiogram of the chest no pulmonary emboli. Troponin elevated 2.32. Placed on broad-spectrum antibiotics. Cardiology services consulted. EKG demonstrated variable sinus tachycardia and ectopic atrial tachycardia, inferior Q waves, transient inferior ST-T wave abnormalities. Echocardiogram with ejection fraction of 40%. Akinesis of the mid-apical inferior lateral myocardium. Critical care pulmonary services was consulted 12/27/2015 for increasing shortness of breath patient ultimately intubated. Cardiac catheterization 12/29/2015 showed 50% ostial LM stenosis and three-vessel coronary artery disease with severe aortic stenosis. Cardiothoracic surgery Dr. Laneta Simmers consulted with patient stabilized and underwent CABG with aortic valve replacement 01/07/2016. Follow-up echocardiogram 01/11/2016 showed LV and RV systolic function normal. Patient currently maintained on Coumadin therapy. He was extubated and  diet has been slowly advanced to regular. Physical therapy has been initiated with sternal precautions with and progressive gains noting debilitation related to multiple medical issues. M.D. has requested physical medicine rehabilitation consult.   Review of Systems  Constitutional: Positive for fever, chills and malaise/fatigue.    HENT: Negative for hearing loss.   Eyes: Negative for blurred vision and double vision.  Respiratory: Positive for cough and shortness of breath.   Cardiovascular: Positive for chest pain and leg swelling.  Gastrointestinal: Positive for constipation. Negative for nausea and vomiting.  Genitourinary: Negative for dysuria and hematuria.  Musculoskeletal: Positive for myalgias and joint pain.  Skin: Negative for rash.  Neurological: Positive for weakness. Negative for seizures and headaches.  All other systems reviewed and are negative.  Past Medical History  Diagnosis Date  . COPD (chronic obstructive pulmonary disease) (HCC)   . Hypertension   . Hyperlipidemia   . Alcohol abuse     Prior  . Tobacco abuse     Prior   Past Surgical History  Procedure Laterality Date  . Ruptured disc repair  2009 or 2010  . Cardiac catheterization N/A 12/29/2015    Procedure: Right/Left Heart Cath and Coronary Angiography;  Surgeon: Peter M Swaziland, MD;  Location: [facility] INVASIVE CV LAB;  Service: Cardiovascular;  Laterality: N/A;  . Coronary artery bypass graft N/A 01/07/2016    Procedure: CORONARY ARTERY BYPASS GRAFTING (CABG), ON PUMP, TIMES FOUR, USING LEFT INTERNAL MAMMARY ARTERY, BILATERAL GREATER SAPHENOUS VEINS HARVESTED ENDOSCOPICALLY;  Surgeon: Alleen Borne, MD;  Location: [facility] OR;  Service: Open Heart Surgery;  Laterality: N/A;  LIMA to LAD, SVG to OM, SVG SEQUENTIALLY to PDA and PLB  . Tee without cardioversion N/A 01/07/2016    Procedure: TRANSESOPHAGEAL ECHOCARDIOGRAM (TEE);  Surgeon: Alleen Borne, MD;  Location: [facility] OR;  Service: Open Heart Surgery;  Laterality: N/A;  . Thoracic aortic aneurysm repair N/A 01/07/2016    Procedure: CIRC ARREST AND REPLACEMENT OF ASCENDING THORACIC  ANEURYSM;  Surgeon: Alleen Borne, MD;  Location: [facility] OR;  Service: Open Heart Surgery;  Laterality: N/A;   Family History  Problem Relation Age of Onset  . Diabetes Father    Social  History:  reports that he has quit  smoking. He has never used smokeless tobacco. He reports that he does not drink alcohol or use illicit drugs. Allergies: No Known Allergies No prescriptions prior to admission    Home: Home Living Family/patient expects to be discharged to:: Private residence Living Arrangements: Children, Other relatives (plans to stay with sister at d/c.) Available Help at Discharge: Family, Available 24 hours/day Type of Home: House Home Access: Stairs to enter Entergy CorporationEntrance Stairs-Number of Steps: 3? Entrance Stairs-Rails: Right Home Layout: One level Home Equipment: None  Functional History: Prior Function Level of Independence: Independent Functional Status:  Mobility: Bed Mobility General bed mobility comments: Sitting in chair upon PT arrival.  Transfers Overall transfer level: Needs assistance Equipment used:  (w/c.) Transfers: Sit to/from Stand Sit to Stand: Mod assist, +2 physical assistance Stand pivot transfers: Min assist General transfer comment: Mod A of 2 to boost from chair with cues for hand placement/technique and use of heart pillow. Dizziness in standing. Sp02 remained >90% on 4L/min 02 [place].  Ambulation/Gait Ambulation/Gait assistance: Min assist, +2 safety/equipment Ambulation Distance (Feet): 8 Feet (+ 15') Assistive device:  (pushed w/c) Gait Pattern/deviations: Step-through pattern, Decreased stride length, Trunk flexed General Gait Details: Slow, unsteady gait with Min A  for balance and w/c management. Fatigues. 2/4 DOE. + dizziness. BP and SP02 stable. Gait velocity interpretation: Below normal speed for age/gender    ADL:    Cognition: Cognition Overall Cognitive Status: Within Functional Limits for tasks assessed Orientation Level: Oriented X4 Cognition Arousal/Alertness: Awake/alert Behavior During Therapy: WFL for tasks assessed/performed Overall Cognitive Status: Within Functional Limits for tasks assessed  Blood pressure 94/83, pulse 95, temperature 97.5  F (36.4 C), temperature source Oral, resp. rate 17, height 5\' 9"  (1.753 m), weight 71.8 kg (158 lb 4.6 oz), SpO2 95 %. Physical Exam  Vitals reviewed. Constitutional: He is oriented to person, place, and time. He appears well-developed.  Appears fatigued  HENT:  Head: Normocephalic and atraumatic.  Eyes: EOM are normal.  Neck: Normal range of motion. Neck supple. No thyromegaly present.  Cardiovascular:  Cardiac rate controlled  Respiratory: No respiratory distress. He has no wheezes.  Decreased breath sounds at the bases  GI: Soft. Bowel sounds are normal. He exhibits no distension.  Musculoskeletal:  Bilateral LE edema.  Neurological: He is alert and oriented to person, place, and time.  Skin: No rash noted. There is erythema.  Chest incisions clean and dry. Donor sites generally intact/tender, incision near right knee with sl drainage  Psychiatric:  Flat, generally pleasant and cooperative    Results for orders placed or performed during the hospital encounter of 12/26/15 (from the past 24 hour(s))  I-STAT, chem 8     Status: Abnormal   Collection Time: 01/21/16  8:49 AM  Result Value Ref Range   Sodium 138 135 - 145 mmol/L   Potassium 5.5 (H) 3.5 - 5.1 mmol/L   Chloride 90 (L) 101 - 111 mmol/L   BUN 47 (H) 6 - 20 mg/dL   Creatinine, Ser 1.611.50 (H) 0.61 - 1.24 mg/dL   Glucose, Bld 096113 (H) 65 - 99 mg/dL   Calcium, Ion 0.451.16 4.091.13 - 1.30 mmol/L   TCO2 41 0 - 100 mmol/L   Hemoglobin 14.3 13.0 - 17.0 g/dL   HCT 81.142.0 91.439.0 - 78.252.0 %  Basic metabolic panel     Status: Abnormal   Collection Time: 01/21/16 12:57 PM  Result Value Ref Range   Sodium 139 135 - 145  mmol/L   Potassium 4.4 3.5 - 5.1 mmol/L   Chloride 93 (L) 101 - 111 mmol/L   CO2 39 (H) 22 - 32 mmol/L   Glucose, Bld 146 (H) 65 - 99 mg/dL   BUN 47 (H) 6 - 20 mg/dL   Creatinine, Ser 1.61 (H) 0.61 - 1.24 mg/dL   Calcium 8.0 (L) 8.9 - 10.3 mg/dL   GFR calc non Af Amer 55 (L) >60 mL/min   GFR calc Af Amer >60 >60 mL/min     Anion gap 7 5 - 15  Protime-INR     Status: Abnormal   Collection Time: 01/22/16  5:13 AM  Result Value Ref Range   Prothrombin Time 23.0 (H) 11.6 - 15.2 seconds   INR 2.06 (H) 0.00 - 1.49  Magnesium     Status: None   Collection Time: 01/22/16  5:13 AM  Result Value Ref Range   Magnesium 1.9 1.7 - 2.4 mg/dL  Phosphorus     Status: None   Collection Time: 01/22/16  5:13 AM  Result Value Ref Range   Phosphorus 3.1 2.5 - 4.6 mg/dL  Comprehensive metabolic panel     Status: Abnormal   Collection Time: 01/22/16  5:13 AM  Result Value Ref Range   Sodium 138 135 - 145 mmol/L   Potassium 5.2 (H) 3.5 - 5.1 mmol/L   Chloride 91 (L) 101 - 111 mmol/L   CO2 36 (H) 22 - 32 mmol/L   Glucose, Bld 132 (H) 65 - 99 mg/dL   BUN 49 (H) 6 - 20 mg/dL   Creatinine, Ser 0.96 (H) 0.61 - 1.24 mg/dL   Calcium 8.4 (L) 8.9 - 10.3 mg/dL   Total Protein 4.8 (L) 6.5 - 8.1 g/dL   Albumin 2.2 (L) 3.5 - 5.0 g/dL   AST 67 (H) 15 - 41 U/L   ALT 145 (H) 17 - 63 U/L   Alkaline Phosphatase 94 38 - 126 U/L   Total Bilirubin 1.4 (H) 0.3 - 1.2 mg/dL   GFR calc non Af Amer 59 (L) >60 mL/min   GFR calc Af Amer >60 >60 mL/min   Anion gap 11 5 - 15  CBC     Status: Abnormal   Collection Time: 01/22/16  5:13 AM  Result Value Ref Range   WBC 13.9 (H) 4.0 - 10.5 K/uL   RBC 3.72 (L) 4.22 - 5.81 MIL/uL   Hemoglobin 11.9 (L) 13.0 - 17.0 g/dL   HCT 04.5 (L) 40.9 - 81.1 %   MCV 100.8 (H) 78.0 - 100.0 fL   MCH 32.0 26.0 - 34.0 pg   MCHC 31.7 30.0 - 36.0 g/dL   RDW 91.4 (H) 78.2 - 95.6 %   Platelets 127 (L) 150 - 400 K/uL   Dg Chest Port 1 View  01/21/2016  CLINICAL DATA:  Bilateral thoracentesis. EXAM: PORTABLE CHEST 1 VIEW COMPARISON:  01/21/2016 FINDINGS: Both pleural effusions are considerably improved. No pneumothorax seen. Prior median sternotomy. Interstitial accentuation in both lungs favoring the lung bases. Prosthetic aortic valve. Old healed bilateral rib fractures. Stable enlargement of the cardiopericardial  silhouette IMPRESSION: 1. Prominent reduction in size of the bilateral pleural effusions. No pneumothorax seen. 2. Mild interstitial accentuation, favoring the lung bases, with cardiac enlargement, possible interstitial pulmonary edema or re-expansion edema. 3. Old bilateral rib fractures. Electronically Signed   By: Gaylyn Rong M.D.   On: 01/21/2016 09:50   Dg Chest Port 1 View  01/21/2016  CLINICAL DATA:  Post CABG EXAM: PORTABLE  CHEST 1 VIEW COMPARISON:  Portable exam 0558 hours compared to 01/20/2016 FINDINGS: Rotated to the RIGHT. Enlargement of cardiac silhouette post median sternotomy. Bibasilar effusions and atelectasis. No gross infiltrate or pneumothorax. Old BILATERAL rib fractures. IMPRESSION: Enlargement of cardiac silhouette. Persistent bibasilar effusions atelectasis slightly greater on LEFT. Electronically Signed   By: Ulyses Southward M.D.   On: 01/21/2016 08:00    Assessment/Plan: Diagnosis: debility related to sepsis and AS with subsequent CABG/AVR and multiple medical issues 1. Does the need for close, 24 hr/day medical supervision in concert with the patient's rehab needs make it unreasonable for this patient to be served in a less intensive setting? Yes 2. Co-Morbidities requiring supervision/potential complications: COPD, CAD, acute renal failure, pneumonia 3. Due to bladder management, bowel management, safety, skin/wound care, disease management, medication administration, pain management and patient education, does the patient require 24 hr/day rehab nursing? Yes 4. Does the patient require coordinated care of a physician, rehab nurse, PT (1-2 hrs/day, 5 days/week) and OT (1-2 hrs/day, 5 days/week) to address physical and functional deficits in the context of the above medical diagnosis(es)? Yes Addressing deficits in the following areas: balance, endurance, locomotion, strength, transferring, bowel/bladder control, bathing, dressing, feeding, grooming, toileting and  psychosocial support 5. Can the patient actively participate in an intensive therapy program of at least 3 hrs of therapy per day at least 5 days per week? Yes 6. The potential for patient to make measurable gains while on inpatient rehab is excellent 7. Anticipated functional outcomes upon discharge from inpatient rehab are modified independent and supervision  with PT, modified independent and supervision with OT, n/a with SLP. 8. Estimated rehab length of stay to reach the above functional goals is: 8-12 days 9. Does the patient have adequate social supports and living environment to accommodate these discharge functional goals? Yes 10. Anticipated D/C setting: Home 11. Anticipated post D/C treatments: HH therapy and Outpatient therapy 12. Overall Rehab/Functional Prognosis: excellent  RECOMMENDATIONS: This patient's condition is appropriate for continued rehabilitative care in the following setting: CIR when medically appropriate Patient has agreed to participate in recommended program. Yes Note that insurance prior authorization may be required for reimbursement for recommended care.  Comment: Rehab Admissions Coordinator to follow up.  Thanks,  Ranelle Oyster, MD, [place]     01/22/2016

## 2016-01-22 NOTE — Progress Notes (Signed)
PCCM PROGRESS NOTE  ADMISSION DATE: 12/26/2015 CONSULT DATE: 12/27/2015 REFERRING PROVIDER: [facility]  CC: Short of breath     CULTURES: 2/19 Respiratory viral panel >> negative 2/19 Sputum >> oral flora 2/19 Pneumococcal ag >> negative 3/06 Blood >> NTD  ANTIBIOTICS: 2/18 Zithromax >> 2/21 2/18 Rocephin >> 2/24 3/06 Imipenem >>3/12 3/06 Vancomycin >>3/12   LINES/TUBES: 2/19 ETT >> 2/19 (self extubated) 3/02 ETT >> 3/03 3/02 Rt radial a line >>3/04 3/02 Rt IJ introducer >>3/04 3/06 ETT >> 3/07 3/06 Lt Ventress CVL 3/06 >> 3/06 Lt femoral CVL >> 3/08   TUDIES:  EVENTS: 2/18 Admit, cardiology consulted 2/18 CT chest >> emphysema, consolidation RLL 2/19 To ICU, VDRF 2/21 Rt/Lt heart cath, TCTS consulted 2/21 Rt/Lt heart cath, TCTS consulted 2/20 Echo >> EF 35 to 40%, severe AS, 42 mm aortic root, mild MR 2/26 - PAF 2/27 PFT >> FEV1 1.26 (37%), FEV1% 34, TLC 6.17 (90%), DLCO 26%, no BD 3/02 CABG, AVR, replacement of ascending thoracic aortic aneurysm 3/03 Transfuse PRBC 3/06 resp failure, lactic acidosis, ARF, shock liver, ETT 3/06 Echo >> normal LV/RV systolic fx 3/06 Abd u/s >> GB wall thickening w/o gallstones 3/06 CT abd/pelvis >> GB sludge, non obstructive renal stones b/l, diverticulosis 3/06 CT chest >> b/l effusions, centrilobular and paraseptal emphysema, compressive ATX 3/08 Off pressors 3/11 Rt thoracentesis >> 1400 ml fluid 3/14 remains very weak. 3/17 better    SUBJECTIVE: 3/16 - bialteral thora 1400-1800cc Feels better but chest sore. Overal weak. Sister at bedside.   VITAL SIGNS: BP 103/77 mmHg  Pulse 98  Temp(Src) 98 F (36.7 C) (Oral)  Resp 22  Ht  (1.753 m)  Wt 158 lb 4.6 oz (71.8 kg)  BMI 23.36 kg/m2  SpO2 100%  INTAKE/OUTPUT: I/O last 3 completed shifts: In: 1243 [P.O.:1240; I.V.:3] Out: 825 [Urine:825]  General: sitting up in bedir, comfortable LOOKS VERY DECONDITIONED. HEENT: no stridor, [place]/AT, PERRL, EOM-I and MMM. Cardiac:  RRR, 2/6 SM Chest: decreased BS at bases, no wheeze but air movement is not great. Abd: soft, mildly tender, ND and +BS Ext: no edema and -tenderness. Neuro: alert, follows commands Skin: no rashes   PULMONARY  Recent Labs Lab 01/21/16 0849  TCO2 41    CBC  Recent Labs Lab 01/20/16 0225 01/21/16 0223 01/21/16 0849 01/22/16 0513  HGB 11.6* 11.7* 14.3 11.9*  HCT 38.4* 39.3 42.0 37.5*  WBC 17.5* 17.1*  --  13.9*  PLT 93* 109*  --  127*    COAGULATION  Recent Labs Lab 01/18/16 0500 01/19/16 0246 01/20/16 0225 01/21/16 0223 01/22/16 0513  INR 1.55* 1.46 1.49 1.78* 2.06*    CARDIAC  No results for input(s): TROPONINI in the last 168 hours. No results for input(s): PROBNP in the last 168 hours.   CHEMISTRY  Recent Labs Lab 01/19/16 0246 01/20/16 0225 01/21/16 0223  01/21/16 0849 01/21/16 1257 01/22/16 0513  NA 140 139 139  --  138 139 138  K 4.2 4.4 6.7*  < > 5.5* 4.4 5.2*  CL 94* 92* 94*  --  90* 93* 91*  CO2 35* 34* 38*  --   --  39* 36*  GLUCOSE 112* 114* 119*  --  113* 146* 132*  BUN 42* 48* 47*  --  47* 47* 49*  CREATININE 1.42* 1.43* 1.34*  --  1.50* 1.33* 1.26*  CALCIUM 8.2* 8.5* 8.7*  --   --  8.0* 8.4*  MG 1.8 2.1 2.0  --   --   --  1.9  PHOS 4.1 4.3 4.1  --   --   --  3.1  < > = values in this interval not displayed. Estimated Creatinine Clearance: 59.2 mL/min (by C-G formula based on Cr of 1.26).   LIVER  Recent Labs Lab 01/18/16 0500 01/19/16 0246 01/20/16 0225 01/21/16 0223 01/22/16 0513  AST 95* 77* 68*  --  67*  ALT 462* 323* 255*  --  145*  ALKPHOS 93 96 100  --  94  BILITOT 3.7* 3.7* 2.3*  --  1.4*  PROT 5.0* 5.1* 5.4*  --  4.8*  ALBUMIN 2.5* 2.5* 2.6*  --  2.2*  INR 1.55* 1.46 1.49 1.78* 2.06*     INFECTIOUS No results for input(s): LATICACIDVEN, PROCALCITON in the last 168 hours.   ENDOCRINE CBG (last 3)  No results for input(s): GLUCAP in the last 72 hours.       IMAGING x48h  - image(s) personally  visualized  -   highlighted in bold Dg Chest Port 1 View  01/22/2016  CLINICAL DATA:  Status post aortic valve repair. EXAM: PORTABLE CHEST 1 VIEW COMPARISON:  01/21/2016 FINDINGS: Prior CABG. Heart is borderline in size. Small bilateral pleural effusions with bibasilar airspace opacities, both increasing since prior study. Mild vascular congestion. Old bilateral rib fractures. No acute bony abnormality. IMPRESSION: Worsening bibasilar atelectasis or infiltrates with small effusions. Electronically Signed   By: Charlett NoseKevin  Dover M.D.   On: 01/22/2016 08:11   Dg Chest Port 1 View  01/21/2016  CLINICAL DATA:  Bilateral thoracentesis. EXAM: PORTABLE CHEST 1 VIEW COMPARISON:  01/21/2016 FINDINGS: Both pleural effusions are considerably improved. No pneumothorax seen. Prior median sternotomy. Interstitial accentuation in both lungs favoring the lung bases. Prosthetic aortic valve. Old healed bilateral rib fractures. Stable enlargement of the cardiopericardial silhouette IMPRESSION: 1. Prominent reduction in size of the bilateral pleural effusions. No pneumothorax seen. 2. Mild interstitial accentuation, favoring the lung bases, with cardiac enlargement, possible interstitial pulmonary edema or re-expansion edema. 3. Old bilateral rib fractures. Electronically Signed   By: Gaylyn RongWalter  Liebkemann M.D.   On: 01/21/2016 09:50   Dg Chest Port 1 View  01/21/2016  CLINICAL DATA:  Post CABG EXAM: PORTABLE CHEST 1 VIEW COMPARISON:  Portable exam 0558 hours compared to 01/20/2016 FINDINGS: Rotated to the RIGHT. Enlargement of cardiac silhouette post median sternotomy. Bibasilar effusions and atelectasis. No gross infiltrate or pneumothorax. Old BILATERAL rib fractures. IMPRESSION: Enlargement of cardiac silhouette. Persistent bibasilar effusions atelectasis slightly greater on LEFT. Electronically Signed   By: Ulyses SouthwardMark  Boles M.D.   On: 01/21/2016 08:00    .  DISCUSSION: 65 yo male former smoker presented with fever, cough,  dyspnea, chills from pneumonia.  He was found to have troponin elevation.  He has hx of COPD/emphysema.  ASSESSMENT/PLAN:  PULMONARY A: Acute on chronic hypoxic/hypercapnic respiratory failure >> much improved after Rt thoracentesis 3/11. COPD/emphysema with exacerbation. B/l pleural effusions.   - improved resp status after bialteral thora. Having some chest soreness post thora   P: Tramadol per CVTS - - one time fentanyl 12.195mcg x  1 per PCCM x 1 now Monitor in ICU  Oxygen to keep SpO2 90 to 95%. Continue mucinex to help with sputum expulsion Continue brovana, pulmicort, spiriva PRN albuterol Ambulate as able.  CARDIOLOGY A: NSTEMI. Severe AS and severe CAD s/p CABG and AVR 3/02. Acute on chronic systolic/diastolic CHF. New shock 3/06 >> likely from sepsis. PAF >> sinus rhythm now. HLD. Lab Results  Component Value Date  INR 2.06* 01/22/2016   INR 1.78* 01/21/2016   INR 1.49 01/20/2016    P: Continue lopressor Coumadin restarted. INR per pharm  RENAL Lab Results  Component Value Date   CREATININE 1.26* 01/22/2016   CREATININE 1.33* 01/21/2016   CREATININE 1.50* 01/21/2016    A: ARF likely from ATN >> improving. Hyperkalemia >> resolved. P: Monitor renal fx, urine outpt, electrolytes Replace electrolytes as indicated D/C further diureses.  GASTROENTEROLOGY A: Nutrition. Shock liver >> LFTs trending down. P: Started regular diet and feeling better. CMET in AM.  HEMATOLOGY A: Anemia of critical illness. Thrombocytopenia. P: F/u CBC  INFECTION A: CAP >> completed Abx 2/24. Septic shock 3/06 >> possible sources are line vs respiratory vs gallbladder >> improving. P: Day 7/7 of vancomycin, imipenem >> likely can d/c abx affter doses on 3/12 WBC is increasing however, monitor for signs of infection, no abx as afebrile.  ENDOCRINE A: Hyperglycemia. P: SSI  NEUROLOGY A: Post-op pain control. Deconditioning. P: Prn  fentanyl Mobilize as able   Sister and RN updated at bedside  PCCM will be available prn   [name] Liseth Wann ACNP [facility] PCCM Pager [phone] till 3 pm If no answer page [phone] 01/22/2016, 12:35 PM

## 2016-01-23 ENCOUNTER — Inpatient Hospital Stay (HOSPITAL_COMMUNITY): Payer: Medicaid Other

## 2016-01-23 LAB — PHOSPHORUS: PHOSPHORUS: 3.1 mg/dL (ref 2.5–4.6)

## 2016-01-23 LAB — BASIC METABOLIC PANEL
Anion gap: 12 (ref 5–15)
BUN: 46 mg/dL — AB (ref 6–20)
CALCIUM: 8.9 mg/dL (ref 8.9–10.3)
CO2: 38 mmol/L — ABNORMAL HIGH (ref 22–32)
CREATININE: 1.32 mg/dL — AB (ref 0.61–1.24)
Chloride: 91 mmol/L — ABNORMAL LOW (ref 101–111)
GFR calc Af Amer: >60 mL/min (ref >60)
GFR, EST NON AFRICAN AMERICAN: 55 mL/min — AB (ref >60)
Glucose, Bld: 98 mg/dL (ref 65–99)
POTASSIUM: 6.5 mmol/L — AB (ref 3.5–5.1)
Sodium: 141 mmol/L (ref 135–145)

## 2016-01-23 LAB — MAGNESIUM: MAGNESIUM: 2 mg/dL (ref 1.7–2.4)

## 2016-01-23 LAB — PROTIME-INR
INR: 1.96 — AB (ref 0.00–1.49)
PROTHROMBIN TIME: 22.2 s — AB (ref 11.6–15.2)

## 2016-01-23 MED ORDER — SODIUM CHLORIDE 0.9 % IV SOLN
1.0000 g | Freq: Once | INTRAVENOUS | Status: AC
  Administered 2016-01-23: 1 g via INTRAVENOUS
  Filled 2016-01-23: qty 10

## 2016-01-23 MED ORDER — DEXTROSE 50 % IV SOLN
1.0000 | Freq: Once | INTRAVENOUS | Status: AC
  Administered 2016-01-23: 50 mL via INTRAVENOUS
  Filled 2016-01-23: qty 50

## 2016-01-23 MED ORDER — SODIUM POLYSTYRENE SULFONATE 15 GM/60ML PO SUSP
30.0000 g | Freq: Once | ORAL | Status: AC
  Administered 2016-01-23: 30 g via ORAL
  Filled 2016-01-23: qty 120

## 2016-01-23 MED ORDER — WARFARIN SODIUM 2 MG PO TABS: 2.0000 mg | ORAL_TABLET | Freq: Every day | ORAL | Status: DC

## 2016-01-23 MED ORDER — INSULIN ASPART 100 UNIT/ML ~~LOC~~ SOLN
10.0000 [IU] | Freq: Once | SUBCUTANEOUS | Status: AC
  Administered 2016-01-23: 10 [IU] via SUBCUTANEOUS

## 2016-01-23 MED ORDER — WARFARIN SODIUM 2 MG PO TABS
2.0000 mg | ORAL_TABLET | Freq: Every day | ORAL | Status: DC
  Administered 2016-01-23 – 2016-01-26 (×3): 2 mg via ORAL
  Filled 2016-01-23 (×4): qty 1

## 2016-01-23 NOTE — Progress Notes (Signed)
eLink Physician-Brief Progress Note Patient Name: [name] DOB: 10/15/1951 MRN: [id number]   Date of Service  01/23/2016  HPI/Events of Note  K 6.5 non hemolysed  eICU Interventions  Calcium, Insulin, glucose, kayexalate, EKG Recheck labs     Intervention Category Intermediate Interventions: Electrolyte abnormality - evaluation and management  Yoanna Jurczyk 01/23/2016, 6:14 AM

## 2016-01-23 NOTE — Progress Notes (Signed)
      [address]       [place] [id number]             [phone]        CARDIOTHORACIC SURGERY PROGRESS NOTE   R16 Days Post-Op Procedure(s) (LRB): CORONARY ARTERY BYPASS GRAFTING (CABG), ON PUMP, TIMES FOUR, USING LEFT INTERNAL MAMMARY ARTERY, BILATERAL GREATER SAPHENOUS VEINS HARVESTED ENDOSCOPICALLY (N/A) TRANSESOPHAGEAL ECHOCARDIOGRAM (TEE) (N/A) CIRC ARREST AND REPLACEMENT OF ASCENDING THORACIC  ANEURYSM (N/A)  Subjective: No complaints  Objective: Vital signs: BP Readings from Last 1 Encounters:  01/23/16 111/72   Pulse Readings from Last 1 Encounters:  01/23/16 105   Resp Readings from Last 1 Encounters:  01/23/16 20   Temp Readings from Last 1 Encounters:  01/23/16 98 F (36.7 C) Oral    Hemodynamics:    Physical Exam:  Rhythm:   Afib  Breath sounds: clear  Heart sounds:  irregular  Incisions:  Clean and dry  Abdomen:  Soft, non-distended, non-tender  Extremities:  Warm, well-perfused    Intake/Output from previous day: 03/17 0701 - 03/18 0700 In: 3 [I.V.:3] Out: 875 [Urine:875] Intake/Output this shift: Total I/O In: 100 [IV Piggyback:100] Out: 275 [Urine:275]  Lab Results:  CBC: Recent Labs  01/21/16 0223 01/21/16 0849 01/22/16 0513  WBC 17.1*  --  13.9*  HGB 11.7* 14.3 11.9*  HCT 39.3 42.0 37.5*  PLT 109*  --  127*    BMET:  Recent Labs  01/22/16 0513 01/23/16 0438  NA 138 141  K 5.2* 6.5*  CL 91* 91*  CO2 36* 38*  GLUCOSE 132* 98  BUN 49* 46*  CREATININE 1.26* 1.32*  CALCIUM 8.4* 8.9     PT/INR:   Recent Labs  01/23/16 0438  LABPROT 22.2*  INR 1.96*    CBG (last 3)  No results for input(s): GLUCAP in the last 72 hours.  ABG    Component Value Date/Time   PHART 7.366 01/12/2016 1529   PCO2ART 58.1* 01/12/2016 1529   PO2ART 54.0* 01/12/2016 1529   HCO3 33.4* 01/12/2016 1529   TCO2 41 01/21/2016 0849   ACIDBASEDEF 3.0* 01/11/2016 0728   O2SAT 86.0 01/12/2016 1529    CXR: PORTABLE  CHEST 1 VIEW  COMPARISON: Chest radiograph from one day prior.  FINDINGS: Sternotomy wires appear aligned and intact. Aortic valve prosthesis is in place. Stable cardiomediastinal silhouette with mild cardiomegaly. No pneumothorax. Stable small bilateral pleural effusions. Stable mild pulmonary edema. Stable patchy bibasilar lung opacities.  IMPRESSION: 1. Stable mild congestive heart failure. 2. Stable small bilateral pleural effusions. 3. Stable patchy bibasilar lung opacities, favor atelectasis, cannot exclude a component of pneumonia.   Electronically Signed  By: Delbert PhenixJason A Poff M.D.  On: 01/23/2016 09:42   Assessment/Plan: S/P Procedure(s) (LRB): CORONARY ARTERY BYPASS GRAFTING (CABG), ON PUMP, TIMES FOUR, USING LEFT INTERNAL MAMMARY ARTERY, BILATERAL GREATER SAPHENOUS VEINS HARVESTED ENDOSCOPICALLY (N/A) TRANSESOPHAGEAL ECHOCARDIOGRAM (TEE) (N/A) CIRC ARREST AND REPLACEMENT OF ASCENDING THORACIC  ANEURYSM (N/A)  Remains stable rate-controlled Afib Respiratory status stable Repeat CXR stable Mobilize as much as possible Continue current plan  Purcell Nailslarence H Sanjiv Castorena, MD 01/23/2016 10:49 AM

## 2016-01-23 NOTE — Progress Notes (Signed)
CRITICAL VALUE ALERT  Critical value received:  K+ 6.5  Date of notification:  01/23/16  Time of notification:  0609  Critical value read back: Yes  Nurse who received alert:  Aviva SignsJessica Rejoice Heatwole, RN  MD notified (1st page):  Dr. Isaiah SergeMannam  Time of first page:  0610  MD notified (2nd page):  Time of second page:  Responding MD:  Dr. Isaiah SergeMannam  Time MD responded:  [phone]

## 2016-01-24 DIAGNOSIS — Z951 Presence of aortocoronary bypass graft: Secondary | ICD-10-CM

## 2016-01-24 LAB — BASIC METABOLIC PANEL
ANION GAP: 11 (ref 5–15)
BUN: 41 mg/dL — ABNORMAL HIGH (ref 6–20)
CO2: 37 mmol/L — ABNORMAL HIGH (ref 22–32)
Calcium: 8.6 mg/dL — ABNORMAL LOW (ref 8.9–10.3)
Chloride: 89 mmol/L — ABNORMAL LOW (ref 101–111)
Creatinine, Ser: 1.29 mg/dL — ABNORMAL HIGH (ref 0.61–1.24)
GFR calc Af Amer: >60 mL/min (ref >60)
GFR, EST NON AFRICAN AMERICAN: 57 mL/min — AB (ref >60)
GLUCOSE: 113 mg/dL — AB (ref 65–99)
POTASSIUM: 4.7 mmol/L (ref 3.5–5.1)
Sodium: 137 mmol/L (ref 135–145)

## 2016-01-24 LAB — PROTIME-INR
INR: 1.51 — ABNORMAL HIGH (ref 0.00–1.49)
Prothrombin Time: 18.3 seconds — ABNORMAL HIGH (ref 11.6–15.2)

## 2016-01-24 LAB — MAGNESIUM: Magnesium: 1.8 mg/dL (ref 1.7–2.4)

## 2016-01-24 LAB — PHOSPHORUS: PHOSPHORUS: 4.2 mg/dL (ref 2.5–4.6)

## 2016-01-24 MED ORDER — DIGOXIN 125 MCG PO TABS
0.2500 mg | ORAL_TABLET | Freq: Every day | ORAL | Status: DC
  Administered 2016-01-24 – 2016-02-03 (×11): 0.25 mg via ORAL
  Filled 2016-01-24 (×11): qty 2

## 2016-01-24 MED ORDER — FUROSEMIDE 40 MG PO TABS
40.0000 mg | ORAL_TABLET | Freq: Every day | ORAL | Status: DC
  Administered 2016-01-25 – 2016-01-26 (×2): 40 mg via ORAL
  Filled 2016-01-24 (×2): qty 1

## 2016-01-24 MED ORDER — METOPROLOL TARTRATE 12.5 MG HALF TABLET
12.5000 mg | ORAL_TABLET | Freq: Once | ORAL | Status: AC
  Administered 2016-01-24: 12.5 mg via ORAL
  Filled 2016-01-24: qty 1

## 2016-01-24 MED ORDER — FUROSEMIDE 10 MG/ML IJ SOLN
40.0000 mg | Freq: Once | INTRAMUSCULAR | Status: AC
  Administered 2016-01-24: 40 mg via INTRAVENOUS
  Filled 2016-01-24: qty 4

## 2016-01-24 NOTE — Plan of Care (Signed)
Problem: Activity: Goal: Risk for activity intolerance will decrease Outcome: Progressing Patient is using the the bedpan.   Problem: Nutritional: Goal: Risk for body nutrition deficit will decrease Outcome: Progressing Patient didn't eat his dinner but had one one ensure bottle.   Problem: Respiratory: Goal: Levels of oxygenation will improve Outcome: Progressing Patient is on 3 liters of oxygen.   Problem: Pain Management: Goal: Pain level will decrease Outcome: Progressing Tramadol given once for surgical incision pain and the pain was relieved after the medication.

## 2016-01-24 NOTE — Progress Notes (Addendum)
      [address]       [place] [id number]             [phone]        CARDIOTHORACIC SURGERY PROGRESS NOTE   R17 Days Post-Op Procedure(s) (LRB): CORONARY ARTERY BYPASS GRAFTING (CABG), ON PUMP, TIMES FOUR, USING LEFT INTERNAL MAMMARY ARTERY, BILATERAL GREATER SAPHENOUS VEINS HARVESTED ENDOSCOPICALLY (N/A) TRANSESOPHAGEAL ECHOCARDIOGRAM (TEE) (N/A) CIRC ARREST AND REPLACEMENT OF ASCENDING THORACIC  ANEURYSM (N/A)  Subjective: Looks good.  Slept well last night.  Slowly improving.  No complaints except for LE swelling  Objective: Vital signs: BP Readings from Last 1 Encounters:  01/24/16 137/83   Pulse Readings from Last 1 Encounters:  01/24/16 41   Resp Readings from Last 1 Encounters:  01/24/16 19   Temp Readings from Last 1 Encounters:  01/24/16 98.1 F (36.7 C) Oral    Hemodynamics:    Physical Exam:  Rhythm:   Afib w/ increased HR last 24-36 hours  Breath sounds: clear  Heart sounds:  irregular  Incisions:  Clean and dry  Abdomen:  Soft, non-distended, non-tender  Extremities:  Warm, well-perfused, stable bilateral LE edema   Intake/Output from previous day: 03/18 0701 - 03/19 0700 In: 1240 [P.O.:1140; IV Piggyback:100] Out: 1200 [Urine:1200] Intake/Output this shift: Total I/O In: -  Out: 100 [Urine:100]  Lab Results:  CBC: Recent Labs  01/22/16 0513  WBC 13.9*  HGB 11.9*  HCT 37.5*  PLT 127*    BMET:  Recent Labs  01/23/16 0438 01/24/16 0724  NA 141 137  K 6.5* 4.7  CL 91* 89*  CO2 38* 37*  GLUCOSE 98 113*  BUN 46* 41*  CREATININE 1.32* 1.29*  CALCIUM 8.9 8.6*     PT/INR:   Recent Labs  01/24/16 0724  LABPROT 18.3*  INR 1.51*    CBG (last 3)  No results for input(s): GLUCAP in the last 72 hours.  ABG    Component Value Date/Time   PHART 7.366 01/12/2016 1529   PCO2ART 58.1* 01/12/2016 1529   PO2ART 54.0* 01/12/2016 1529   HCO3 33.4* 01/12/2016 1529   TCO2 41 01/21/2016 0849   ACIDBASEDEF  3.0* 01/11/2016 0728   O2SAT 86.0 01/12/2016 1529    CXR: n/a  Assessment/Plan: S/P Procedure(s) (LRB): CORONARY ARTERY BYPASS GRAFTING (CABG), ON PUMP, TIMES FOUR, USING LEFT INTERNAL MAMMARY ARTERY, BILATERAL GREATER SAPHENOUS VEINS HARVESTED ENDOSCOPICALLY (N/A) TRANSESOPHAGEAL ECHOCARDIOGRAM (TEE) (N/A) CIRC ARREST AND REPLACEMENT OF ASCENDING THORACIC  ANEURYSM (N/A)  Overall stable, slowly improving Remains in persistent Afib, HR steadily increasing since metoprolol stopped Chronic combined systolic and diastolic CHF with expected post-op volume excess, I/O's balanced last 24 hours, weight stable, renal function stable Respiratory status stable since repeat thoracentesis, breathing comfortably w/ O2 sats 96% on 3 L/min Diflucan for yeast UTI Remains very weak and deconditioned Subtherapeutic on warfarin - dose increased yesterday   Will add digoxin for rate control, consider restart beta blocker or trying diltiazem if HR remains elevated  Continue diuresis  Continue warfarin 2 mg/day for now  Recheck WBC and CXR tomorrow  Recheck nutrition labs tomorrow  Possibly ready for transfer step-down soon  Await f/u regarding possible d/c to CIR service  Purcell Nailslarence H Owen, MD 01/24/2016 9:21 AM

## 2016-01-25 ENCOUNTER — Inpatient Hospital Stay (HOSPITAL_COMMUNITY): Payer: Medicaid Other

## 2016-01-25 ENCOUNTER — Encounter (HOSPITAL_COMMUNITY): Payer: Self-pay | Admitting: Certified Registered"

## 2016-01-25 ENCOUNTER — Encounter (HOSPITAL_COMMUNITY)
Admission: EM | Disposition: A | Discharge status: Rehabilitation Facility | Payer: Self-pay | Source: Home / Self Care | Attending: Surgery

## 2016-01-25 ENCOUNTER — Inpatient Hospital Stay (HOSPITAL_COMMUNITY): Payer: Medicaid Other | Admitting: Anesthesiology

## 2016-01-25 DIAGNOSIS — E875 Hyperkalemia: Secondary | ICD-10-CM

## 2016-01-25 DIAGNOSIS — J90 Pleural effusion, not elsewhere classified: Secondary | ICD-10-CM

## 2016-01-25 HISTORY — PX: CHEST TUBE INSERTION: SHX231

## 2016-01-25 LAB — COMPREHENSIVE METABOLIC PANEL
ALT: 130 U/L — ABNORMAL HIGH (ref 17–63)
ANION GAP: 10 (ref 5–15)
AST: 77 U/L — AB (ref 15–41)
Albumin: 2.2 g/dL — ABNORMAL LOW (ref 3.5–5.0)
Alkaline Phosphatase: 101 U/L (ref 38–126)
BILIRUBIN TOTAL: 1.2 mg/dL (ref 0.3–1.2)
BUN: 38 mg/dL — AB (ref 6–20)
CHLORIDE: 90 mmol/L — AB (ref 101–111)
CO2: 39 mmol/L — ABNORMAL HIGH (ref 22–32)
Calcium: 8.6 mg/dL — ABNORMAL LOW (ref 8.9–10.3)
Creatinine, Ser: 1.29 mg/dL — ABNORMAL HIGH (ref 0.61–1.24)
GFR calc Af Amer: >60 mL/min (ref >60)
GFR, EST NON AFRICAN AMERICAN: 57 mL/min — AB (ref >60)
Glucose, Bld: 128 mg/dL — ABNORMAL HIGH (ref 65–99)
POTASSIUM: 5.5 mmol/L — AB (ref 3.5–5.1)
Sodium: 139 mmol/L (ref 135–145)
TOTAL PROTEIN: 5.2 g/dL — AB (ref 6.5–8.1)

## 2016-01-25 LAB — BASIC METABOLIC PANEL
Anion gap: 15 (ref 5–15)
BUN: 37 mg/dL — AB (ref 6–20)
CALCIUM: 8.4 mg/dL — AB (ref 8.9–10.3)
CO2: 29 mmol/L (ref 22–32)
CREATININE: 1.18 mg/dL (ref 0.61–1.24)
Chloride: 92 mmol/L — ABNORMAL LOW (ref 101–111)
GFR calc non Af Amer: >60 mL/min (ref >60)
GLUCOSE: 112 mg/dL — AB (ref 65–99)
Potassium: 5.9 mmol/L — ABNORMAL HIGH (ref 3.5–5.1)
SODIUM: 136 mmol/L (ref 135–145)

## 2016-01-25 LAB — PROTIME-INR
INR: 1.8 — AB (ref 0.00–1.49)
Prothrombin Time: 20.8 seconds — ABNORMAL HIGH (ref 11.6–15.2)

## 2016-01-25 LAB — CBC
HEMATOCRIT: 37.7 % — AB (ref 39.0–52.0)
Hemoglobin: 11.1 g/dL — ABNORMAL LOW (ref 13.0–17.0)
MCH: 29.6 pg (ref 26.0–34.0)
MCHC: 29.4 g/dL — AB (ref 30.0–36.0)
MCV: 100.5 fL — AB (ref 78.0–100.0)
PLATELETS: 186 10*3/uL (ref 150–400)
RBC: 3.75 MIL/uL — ABNORMAL LOW (ref 4.22–5.81)
RDW: 19.7 % — AB (ref 11.5–15.5)
WBC: 9.5 10*3/uL (ref 4.0–10.5)

## 2016-01-25 LAB — MAGNESIUM: Magnesium: 1.8 mg/dL (ref 1.7–2.4)

## 2016-01-25 LAB — PREALBUMIN: PREALBUMIN: 7.5 mg/dL — AB (ref 18–38)

## 2016-01-25 LAB — PHOSPHORUS: Phosphorus: 4 mg/dL (ref 2.5–4.6)

## 2016-01-25 SURGERY — INSERTION, PLEURAL DRAINAGE CATHETER
Anesthesia: Monitor Anesthesia Care | Site: Chest | Laterality: Bilateral

## 2016-01-25 MED ORDER — LIDOCAINE 1 % OPTIME INJ - NO CHARGE
INTRAMUSCULAR | Status: DC | PRN
  Administered 2016-01-25: 15 mL

## 2016-01-25 MED ORDER — DEXTROSE 5 % IV SOLN
1.5000 g | INTRAVENOUS | Status: AC
  Administered 2016-01-25: 1.5 g via INTRAVENOUS
  Filled 2016-01-25: qty 1.5

## 2016-01-25 MED ORDER — PHENYLEPHRINE HCL 10 MG/ML IJ SOLN
10.0000 mg | INTRAMUSCULAR | Status: DC | PRN
  Administered 2016-01-25: 50 ug/min via INTRAVENOUS

## 2016-01-25 MED ORDER — MIDAZOLAM HCL 2 MG/2ML IJ SOLN
INTRAMUSCULAR | Status: AC
  Filled 2016-01-25: qty 2

## 2016-01-25 MED ORDER — FENTANYL CITRATE (PF) 250 MCG/5ML IJ SOLN
INTRAMUSCULAR | Status: AC
  Filled 2016-01-25: qty 5

## 2016-01-25 MED ORDER — FUROSEMIDE 10 MG/ML IJ SOLN
40.0000 mg | Freq: Once | INTRAMUSCULAR | Status: AC
  Administered 2016-01-25: 40 mg via INTRAVENOUS
  Filled 2016-01-25: qty 4

## 2016-01-25 MED ORDER — MIDAZOLAM HCL 5 MG/5ML IJ SOLN
INTRAMUSCULAR | Status: DC | PRN
  Administered 2016-01-25: 1 mg via INTRAVENOUS

## 2016-01-25 MED ORDER — FENTANYL CITRATE (PF) 100 MCG/2ML IJ SOLN
INTRAMUSCULAR | Status: DC | PRN
  Administered 2016-01-25: 25 ug via INTRAVENOUS

## 2016-01-25 MED ORDER — PROPOFOL 500 MG/50ML IV EMUL
INTRAVENOUS | Status: DC | PRN
  Administered 2016-01-25: 50 ug/kg/min via INTRAVENOUS

## 2016-01-25 MED ORDER — FENTANYL CITRATE (PF) 100 MCG/2ML IJ SOLN: 25.0000 ug | INTRAMUSCULAR | Status: DC | PRN

## 2016-01-25 MED ORDER — LACTATED RINGERS IV SOLN
INTRAVENOUS | Status: DC | PRN
  Administered 2016-01-25: 13:00:00 via INTRAVENOUS

## 2016-01-25 MED ORDER — ONDANSETRON HCL 4 MG/2ML IJ SOLN: 4.0000 mg | Freq: Once | INTRAMUSCULAR | Status: DC | PRN

## 2016-01-25 SURGICAL SUPPLY — 31 items
ADH SKN CLS APL DERMABOND .7 (GAUZE/BANDAGES/DRESSINGS) ×1
BRUSH SCRUB EZ PLAIN DRY (MISCELLANEOUS) ×3 IMPLANT
CANISTER SUCTION 2500CC (MISCELLANEOUS) ×3 IMPLANT
COVER SURGICAL LIGHT HANDLE (MISCELLANEOUS) ×3 IMPLANT
DERMABOND ADVANCED (GAUZE/BANDAGES/DRESSINGS) ×2
DERMABOND ADVANCED .7 DNX12 (GAUZE/BANDAGES/DRESSINGS) ×1 IMPLANT
DRAPE C-ARM 42X72 X-RAY (DRAPES) ×3 IMPLANT
DRAPE LAPAROSCOPIC ABDOMINAL (DRAPES) ×3 IMPLANT
DRSG TEGADERM 4X4.75 (GAUZE/BANDAGES/DRESSINGS) ×2 IMPLANT
GAUZE SPONGE 2X2 8PLY STRL LF (GAUZE/BANDAGES/DRESSINGS) IMPLANT
GLOVE EUDERMIC 7 POWDERFREE (GLOVE) ×3 IMPLANT
GOWN STRL REUS W/ TWL LRG LVL3 (GOWN DISPOSABLE) ×1 IMPLANT
GOWN STRL REUS W/ TWL XL LVL3 (GOWN DISPOSABLE) ×1 IMPLANT
GOWN STRL REUS W/TWL LRG LVL3 (GOWN DISPOSABLE) ×3
GOWN STRL REUS W/TWL XL LVL3 (GOWN DISPOSABLE) ×3
KIT BASIN OR (CUSTOM PROCEDURE TRAY) ×3 IMPLANT
KIT PLEURX DRAIN CATH 1000ML (MISCELLANEOUS) ×9 IMPLANT
KIT PLEURX DRAIN CATH 15.5FR (DRAIN) ×6 IMPLANT
KIT ROOM TURNOVER OR (KITS) ×3 IMPLANT
NEEDLE HYPO 25GX1X1/2 BEV (NEEDLE) ×3 IMPLANT
NS IRRIG 1000ML POUR BTL (IV SOLUTION) ×3 IMPLANT
PACK GENERAL/GYN (CUSTOM PROCEDURE TRAY) ×3 IMPLANT
PAD ARMBOARD 7.5X6 YLW CONV (MISCELLANEOUS) ×6 IMPLANT
SET DRAINAGE LINE (MISCELLANEOUS) IMPLANT
SPONGE GAUZE 2X2 STER 10/PKG (GAUZE/BANDAGES/DRESSINGS) ×2
SUT ETHILON 3 0 PS 1 (SUTURE) ×5 IMPLANT
SUT VIC AB 3-0 X1 27 (SUTURE) ×5 IMPLANT
TOWEL OR 17X24 6PK STRL BLUE (TOWEL DISPOSABLE) ×3 IMPLANT
TOWEL OR 17X26 10 PK STRL BLUE (TOWEL DISPOSABLE) ×3 IMPLANT
VALVE REPLACEMENT CAP (MISCELLANEOUS) IMPLANT
WATER STERILE IRR 1000ML POUR (IV SOLUTION) ×1 IMPLANT

## 2016-01-25 NOTE — Anesthesia Procedure Notes (Signed)
Procedure Name: MAC Date/Time: 01/25/2016 6:30 PM Performed by: Orvilla FusATO, Luisalberto Beegle A Pre-anesthesia Checklist: Patient identified, Emergency Drugs available, Suction available, Patient being monitored and Timeout performed Oxygen Delivery Method: Simple face mask Placement Confirmation: positive ETCO2 Dental Injury: Teeth and Oropharynx as per pre-operative assessment

## 2016-01-25 NOTE — Progress Notes (Signed)
Placed on tele to monitor  

## 2016-01-25 NOTE — Transfer of Care (Signed)
Immediate Anesthesia Transfer of Care Note  Patient: Craig [name]  Procedure(s) Performed: Procedure(s): INSERTION Bilateral PLEURAL DRAINAGE CATHETER (Bilateral)  Patient Location: PACU  Anesthesia Type:MAC  Level of Consciousness: awake, alert  and oriented  Airway & Oxygen Therapy: Patient Spontanous Breathing and Patient connected to nasal cannula oxygen  Post-op Assessment: Report given to RN, Post -op Vital signs reviewed and stable and Patient moving all extremities  Post vital signs: Reviewed and stable  Last Vitals:  Filed Vitals:   01/25/16 1600 01/25/16 1636  BP:  125/82  Pulse: 99 110  Temp:    Resp: 23 23    Complications: No apparent anesthesia complications

## 2016-01-25 NOTE — Progress Notes (Signed)
PCCM PROGRESS NOTE  ADMISSION DATE: 12/26/2015 CONSULT DATE: 12/27/2015 REFERRING PROVIDER: [facility]  CC: Short of breath     CULTURES: 2/19 Respiratory viral panel >> negative 2/19 Sputum >> oral flora 2/19 Pneumococcal ag >> negative 3/06 Blood >> NTD  ANTIBIOTICS: 2/18 Zithromax >> 2/21 2/18 Rocephin >> 2/24 3/06 Imipenem >>3/12 3/06 Vancomycin >>3/12   LINES/TUBES: 2/19 ETT >> 2/19 (self extubated) 3/02 ETT >> 3/03 3/02 Rt radial a line >>3/04 3/02 Rt IJ introducer >>3/04 3/06 ETT >> 3/07 3/06 Lt [place] CVL 3/06 >> 3/06 Lt femoral CVL >> 3/08 3/20 bialteral pleurex catheters  TUDIES:  EVENTS: 2/18 Admit, cardiology consulted 2/18 CT chest >> emphysema, consolidation RLL 2/19 To ICU, VDRF 2/21 Rt/Lt heart cath, TCTS consulted 2/21 Rt/Lt heart cath, TCTS consulted 2/20 Echo >> EF 35 to 40%, severe AS, 42 mm aortic root, mild MR 2/26 - PAF 2/27 PFT >> FEV1 1.26 (37%), FEV1% 34, TLC 6.17 (90%), DLCO 26%, no BD 3/02 CABG, AVR, replacement of ascending thoracic aortic aneurysm 3/03 Transfuse PRBC 3/06 resp failure, lactic acidosis, ARF, shock liver, ETT 3/06 Echo >> normal LV/RV systolic fx 3/06 Abd u/s >> GB wall thickening w/o gallstones 3/06 CT abd/pelvis >> GB sludge, non obstructive renal stones b/l, diverticulosis 3/06 CT chest >> b/l effusions, centrilobular and paraseptal emphysema, compressive ATX 3/08 Off pressors 3/11 Rt thoracentesis >> 1400 ml fluid 3/14 remains very weak. 3/17 better 3/20 to OR for bilateral pleurex catheter placement.  SUBJECTIVE: No events overnight.  VITAL SIGNS: BP 126/66 mmHg  Pulse 99  Temp(Src) 97.8 F (36.6 C) (Oral)  Resp 21  Ht  (1.753 m)  Wt 67.5 kg (148 lb 13 oz)  BMI 21.97 kg/m2  SpO2 98%  INTAKE/OUTPUT: I/O last 3 completed shifts: In: 1520 [P.O.:1520] Out: 1925 [Urine:1925]  General: sitting up in bedir, comfortable LOOKS VERY DECONDITIONED. HEENT: no stridor, [place]/AT, PERRL, EOM-I and MMM. Cardiac:  RRR, 2/6 SM Chest: decreased BS at bases, no wheeze but air movement is not great. Abd: soft, mildly tender, ND and +BS Ext: no edema and -tenderness. Neuro: alert, follows commands Skin: no rashes  PULMONARY  Recent Labs Lab 01/21/16 0849  TCO2 41   CBC  Recent Labs Lab 01/21/16 0223 01/21/16 0849 01/22/16 0513 01/25/16 0411  HGB 11.7* 14.3 11.9* 11.1*  HCT 39.3 42.0 37.5* 37.7*  WBC 17.1*  --  13.9* 9.5  PLT 109*  --  127* 186    COAGULATION  Recent Labs Lab 01/21/16 0223 01/22/16 0513 01/23/16 0438 01/24/16 0724 01/25/16 0411  INR 1.78* 2.06* 1.96* 1.51* 1.80*    CARDIAC  No results for input(s): TROPONINI in the last 168 hours. No results for input(s): PROBNP in the last 168 hours.   CHEMISTRY  Recent Labs Lab 01/21/16 0223  01/21/16 1257 01/22/16 0513 01/23/16 0438 01/24/16 0724 01/25/16 0411  NA 139  < > 139 138 141 137 139  K 6.7*  < > 4.4 5.2* 6.5* 4.7 5.5*  CL 94*  < > 93* 91* 91* 89* 90*  CO2 38*  --  39* 36* 38* 37* 39*  GLUCOSE 119*  < > 146* 132* 98 113* 128*  BUN 47*  < > 47* 49* 46* 41* 38*  CREATININE 1.34*  < > 1.33* 1.26* 1.32* 1.29* 1.29*  CALCIUM 8.7*  --  8.0* 8.4* 8.9 8.6* 8.6*  MG 2.0  --   --  1.9 2.0 1.8 1.8  PHOS 4.1  --   --  3.1  3.1 4.2 4.0  < > = values in this interval not displayed. Estimated Creatinine Clearance: 55.2 mL/min (by C-G formula based on Cr of 1.29).   LIVER  Recent Labs Lab 01/19/16 0246 01/20/16 0225 01/21/16 0223 01/22/16 0513 01/23/16 0438 01/24/16 0724 01/25/16 0411  AST 77* 68*  --  67*  --   --  77*  ALT 323* 255*  --  145*  --   --  130*  ALKPHOS 96 100  --  94  --   --  101  BILITOT 3.7* 2.3*  --  1.4*  --   --  1.2  PROT 5.1* 5.4*  --  4.8*  --   --  5.2*  ALBUMIN 2.5* 2.6*  --  2.2*  --   --  2.2*  INR 1.46 1.49 1.78* 2.06* 1.96* 1.51* 1.80*     INFECTIOUS No results for input(s): LATICACIDVEN, PROCALCITON in the last 168 hours.   ENDOCRINE CBG (last 3)  No results  for input(s): GLUCAP in the last 72 hours.       IMAGING x48h  - image(s) personally visualized  -   highlighted in bold Dg Chest 2 View  01/25/2016  CLINICAL DATA:  Community-acquired pneumonia, COPD, acute MI, valvular heart disease, coronary artery disease. EXAM: CHEST  2 VIEW COMPARISON:  Portable chest x-ray of January 23, 2016 FINDINGS: The lungs are slightly better inflated. There are persistent bilateral pleural effusions. There is bibasilar atelectasis or pneumonia. The cardiac silhouette is top-normal in size. The pulmonary vascularity is not engorged. The patient has undergone previous median sternotomy. The bony thorax exhibits no acute abnormality. There are old bilateral rib deformities. IMPRESSION: The lungs are slightly less well inflated today. There is persistent bibasilar atelectasis or pneumonia. There are small bilateral pleural effusions consistent with low-grade CHF. Electronically Signed   By: David  [place] M.D.   On: 01/25/2016 07:34   I reviewed CXR myself, bilateral pleural effusions increasing.  DISCUSSION: 65 yo male former smoker presented with fever, cough, dyspnea, chills from pneumonia.  He was found to have troponin elevation.  He has hx of COPD/emphysema.  ASSESSMENT/PLAN:  PULMONARY A: Acute on chronic hypoxic/hypercapnic respiratory failure >> much improved after Rt thoracentesis 3/11. COPD/emphysema with exacerbation. B/l pleural effusions. Recurring pleural effusion. P: Monitor in ICU  Oxygen to keep SpO2 90 to 95%. Continue mucinex to help with sputum expulsion Continue brovana, pulmicort, spiriva PRN albuterol Ambulate as able. Bilateral pleurex catheter placement via CVTS today.  CARDIOLOGY A: NSTEMI. Severe AS and severe CAD s/p CABG and AVR 3/02. Acute on chronic systolic/diastolic CHF. New shock 3/06 >> likely from sepsis. PAF >> sinus rhythm now. HLD. Lab Results  Component Value Date   INR 1.80* 01/25/2016   INR 1.51*  01/24/2016   INR 1.96* 01/23/2016  P: Continue lopressor Coumadin restarted. INR per pharm  RENAL Lab Results  Component Value Date   CREATININE 1.29* 01/25/2016   CREATININE 1.29* 01/24/2016   CREATININE 1.32* 01/23/2016    A: ARF likely from ATN >> improving. Hyperkalemia. P: Monitor renal fx, urine outpt, electrolytes Replace electrolytes as indicated D/C further diureses. BMET again at 6 PM, if rising then will give a dose of kayexalate.  GASTROENTEROLOGY A: Nutrition. Shock liver >> LFTs trending down. P: Started regular diet and feeling better.  HEMATOLOGY A: Anemia of critical illness. Thrombocytopenia. P: F/u CBC  INFECTION A: CAP >> completed Abx 2/24. Septic shock 3/06 >> possible sources are line vs respiratory vs  gallbladder >> improving. P: Day 7/7 of vancomycin, imipenem >> likely can d/c abx affter doses on 3/12 WBC is increasing however, monitor for signs of infection, no abx as afebrile.  ENDOCRINE A: Hyperglycemia. P: SSI  NEUROLOGY A: Post-op pain control. Deconditioning. P: Prn fentanyl Mobilize as able  Discussed with PCCM-NP.  Alyson ReedyWesam G. Yacoub, M.D. [facility]. Pager: [phone]. After hours pager: [phone].  01/25/2016, 2:08 PM

## 2016-01-25 NOTE — Progress Notes (Signed)
Patient has been updated on surgeon delay comfort measures have been provided.

## 2016-01-25 NOTE — Anesthesia Preprocedure Evaluation (Addendum)
Anesthesia Evaluation  Patient identified by MRN, date of birth, ID band Patient awake  General Assessment Comment:65 y/o male with hx of COPD, HTN, HLD, alcohol abuse and tobacco abuse presents with CAP, NSTEMI and intubated due to respiratory failure 2/19-2/20, s/p CABG x4 on 3/2 and s/p bilateral thoracentesis 3/10-11.   Reviewed: Allergy & Precautions, NPO status , Patient's Chart, lab work & pertinent test results, reviewed documented beta blocker date and time   History of Anesthesia Complications Negative for: history of anesthetic complications  Airway Mallampati: II  TM Distance: >3 FB Neck ROM: Full    Dental  (+) Dental Advisory Given, Edentulous Upper, Missing,    Pulmonary pneumonia, COPD, former smoker,  Bilateral pleural effusions    + decreased breath sounds  rales    Cardiovascular hypertension, Pt. on home beta blockers and Pt. on medications + angina + CAD, + Past MI, + CABG and + Peripheral Vascular Disease   Rhythm:Irregular Rate:Abnormal  Echo 3.6.17: Study Conclusions  - Pericardium, extracardiac: A trivial pericardial effusion was identified. - Impressions: Difficult echo Poor acoustic windows. Contrast used. LV and RV systolic function appear grossly normal. There does not appear to be a significant pericaridial effusion. Limited exam.  Impressions:  - Difficult echo Poor acoustic windows. Contrast used.  LV and RV systolic function appear grossly normal.  There does not appear to be a significant pericaridial effusion. Limited exam.   Neuro/Psych negative neurological ROS  negative psych ROS   GI/Hepatic negative GI ROS, (+)     substance abuse  alcohol use,   Endo/Other  negative endocrine ROS  Renal/GU Renal disease     Musculoskeletal negative musculoskeletal ROS (+)   Abdominal   Peds  Hematology  (+) Blood dyscrasia, anemia ,   Anesthesia Other Findings Day of surgery  medications reviewed with the patient.  Reproductive/Obstetrics                        Anesthesia Physical Anesthesia Plan  ASA: III  Anesthesia Plan: MAC   Post-op Pain Management:    Induction: Intravenous  Airway Management Planned: Nasal Cannula  Additional Equipment:   Intra-op Plan:   Post-operative Plan:   Informed Consent: I have reviewed the patients History and Physical, chart, labs and discussed the procedure including the risks, benefits and alternatives for the proposed anesthesia with the patient or authorized representative who has indicated his/her understanding and acceptance.   Dental advisory given  Plan Discussed with: CRNA and Anesthesiologist  Anesthesia Plan Comments: (Discussed risks/benefits/alternatives to MAC sedation including need for ventilatory support, hypotension, need for conversion to general anesthesia.  All patient questions answered.  Patient wished to proceed.)        Anesthesia Quick Evaluation

## 2016-01-25 NOTE — Progress Notes (Signed)
I met with pt ay bedside to discuss a possible inpt rehab admission pending bed availability when pt medically ready. He states he and his sister both prefer an inpt rehab admission rather that SNF. I will follow his progress daily. [phone]

## 2016-01-25 NOTE — Progress Notes (Signed)
Patient c/o right shoulder pain. Massaged shoulder and provided heat pack. Patient reports relief.

## 2016-01-25 NOTE — Progress Notes (Signed)
Physical Therapy Treatment Patient Details Name: Craig [name] MRN: [id number] DOB: 1950-11-21 Today's Date: 01/25/2016    History of Present Illness Patient is a 65 y/o male with hx of COPD, HTN, HLD, alcohol abuse and tobacco abuse presents with CAP, NSTEMI and intubated due to respiratory failure 2/19-2/20, s/p CABG x4 on 3/2 and s/p bilateral thoracentesis 3/10-11. Continues to have tenuous respiratory status secondary to severe COPD, bilateral pleural effusions and atelectasis.  Pt going to OR today 01/25/16 for bilateral pleurX catheter placement.    PT Comments    Patient reports not feeling well today with increased WOB. Swelling present in BLEs and feet. Agreeable to taking a few steps to chair however ambulation limited due to respiratory status. Pt plans to go down for bil pleurX catheter placement today and I am hoping this will help improve respiration and improve tolerance for activity. Pt will need to be able to tolerate more activity for CIR. Will continue to follow.   Follow Up Recommendations  CIR     Equipment Recommendations  Rolling walker with 5" wheels    Recommendations for Other Services       Precautions / Restrictions Precautions Precautions: Fall;Sternal Restrictions Weight Bearing Restrictions: Yes (sternal precautions)    Mobility  Bed Mobility Overal bed mobility: Needs Assistance Bed Mobility: Supine to Sit     Supine to sit: Mod assist;HOB elevated     General bed mobility comments: Able to bring LEs to EOB and elevate trunk but needed assist scooting bottom to EOB. Holding heart pillow to maintain sternal precautions.   Transfers Overall transfer level: Needs assistance Equipment used: Rolling walker (2 wheeled)   Sit to Stand: Min assist;+2 physical assistance         General transfer comment: Assist to rise from EOB with cues for technique, holding heart pillow for sternal precautions.   Ambulation/Gait Ambulation/Gait  assistance: Min assist;+2 safety/equipment Ambulation Distance (Feet): 4 Feet Assistive device: Rolling walker (2 wheeled) Gait Pattern/deviations: Step-to pattern;Decreased stride length;Shuffle     General Gait Details: Able to take a few steps to get to chair however declined further ambulation due to not feeling well and increased WOB. Sp02 dropped to 87%. Recovered quickly with rest.    Stairs            Wheelchair Mobility    Modified Rankin (Stroke Patients Only)       Balance Overall balance assessment: Needs assistance Sitting-balance support: Feet supported;No upper extremity supported Sitting balance-Leahy Scale: Fair     Standing balance support: During functional activity;Bilateral upper extremity supported Standing balance-Leahy Scale: Poor Standing balance comment: Reliant on UEs for support.                    Cognition Arousal/Alertness: Awake/alert Behavior During Therapy: WFL for tasks assessed/performed Overall Cognitive Status: Within Functional Limits for tasks assessed                      Exercises General Exercises - Lower Extremity Ankle Circles/Pumps: Both;10 reps;Supine Long Arc Quad: Both;5 reps;Seated    General Comments        Pertinent Vitals/Pain Pain Assessment: 0-10 Pain Score: 2  Pain Location: feet Pain Descriptors / Indicators: Tightness Pain Intervention(s): Monitored during session;Repositioned;Limited activity within patient's tolerance    Home Living                      Prior Function  PT Goals (current goals can now be found in the care plan section) Progress towards PT goals: Not progressing toward goals - comment (due to impaired respiratory status, increased WOB.)    Frequency  Min 3X/week    PT Plan Current plan remains appropriate    Co-evaluation             End of Session Equipment Utilized During Treatment: Gait belt;Oxygen Activity Tolerance: Patient  limited by fatigue;Patient limited by pain Patient left: in chair;with call bell/phone within reach     Time: 0935-0952 PT Time Calculation (min) (ACUTE ONLY): 17 min  Charges:  $Therapeutic Activity: 8-22 mins                    G Codes:      Oliviarose Punch [place] 01/25/2016, 10:50 AM Mylo RedShauna Quindarius [place]ello, PT, DPT [phone]

## 2016-01-25 NOTE — Brief Op Note (Signed)
12/26/2015 - 01/25/2016  8:32 PM  PATIENT:  Craig [name]  65 y.o. male  PRE-OPERATIVE DIAGNOSIS:  Recurrent Pleural Effusions  POST-OPERATIVE DIAGNOSIS:  Recurrent Pleural Effusions  PROCEDURE:  Procedure(s): INSERTION Bilateral PLEURAL DRAINAGE CATHETER (Bilateral)  SURGEON:  Surgeon(s) and Role:    * Alleen BorneBryan K Willys Salvino, MD - Primary  PHYSICIAN ASSISTANT: none  ASSISTANTS: none   ANESTHESIA:   local and IV sedation  EBL:  Total I/O In: 250 [I.V.:250] Out: 2405 [Urine:650; Other:1750; Blood:5]  BLOOD ADMINISTERED:none  DRAINS: none   LOCAL MEDICATIONS USED:  LIDOCAINE   SPECIMEN:  No Specimen and Simple Mastectomy  DISPOSITION OF SPECIMEN:  N/A  COUNTS:  YES  TOURNIQUET:  * No tourniquets in log *  DICTATION: .Note written in EPIC  PLAN OF CARE: Admit to inpatient   PATIENT DISPOSITION:  PACU - hemodynamically stable.   Delay start of Pharmacological VTE agent (>24hrs) due to surgical blood loss or risk of bleeding: not applicable

## 2016-01-25 NOTE — Progress Notes (Signed)
18 Days Post-Op Procedure(s) (LRB): CORONARY ARTERY BYPASS GRAFTING (CABG), ON PUMP, TIMES FOUR, USING LEFT INTERNAL MAMMARY ARTERY, BILATERAL GREATER SAPHENOUS VEINS HARVESTED ENDOSCOPICALLY (N/A) TRANSESOPHAGEAL ECHOCARDIOGRAM (TEE) (N/A) CIRC ARREST AND REPLACEMENT OF ASCENDING THORACIC  ANEURYSM (N/A) Subjective:  Says he doesn't feel good today with shortness of breath. Felt well yesterday.  Objective: Vital signs in last 24 hours: Temp:  [96.9 F (36.1 C)-97.9 F (36.6 C)] 97.4 F (36.3 C) (03/20 0430) Pulse Rate:  [41-123] 102 (03/20 0700) Cardiac Rhythm:  [-] Atrial fibrillation (03/20 0700) Resp:  [12-23] 23 (03/20 0700) BP: (85-144)/(67-97) 132/76 mmHg (03/20 0700) SpO2:  [89 %-100 %] 97 % (03/20 0700) Weight:  [67.5 kg (148 lb 13 oz)] 67.5 kg (148 lb 13 oz) (03/20 0600)  Hemodynamic parameters for last 24 hours:    Intake/Output from previous day: 03/19 0701 - 03/20 0700 In: 1120 [P.O.:1120] Out: 1300 [Urine:1300] Intake/Output this shift:    General appearance: alert and cooperative Heart: irregularly irregular rhythm Lungs: diminished breath sounds LLL and RLL Abdomen: soft, non-tender; bowel sounds normal; no masses,  no organomegaly Extremities: edema moderate in legs Wound: incisions healing well  Lab Results:  Recent Labs  01/25/16 0411  WBC 9.5  HGB 11.1*  HCT 37.7*  PLT 186   BMET:  Recent Labs  01/24/16 0724 01/25/16 0411  NA 137 139  K 4.7 5.5*  CL 89* 90*  CO2 37* 39*  GLUCOSE 113* 128*  BUN 41* 38*  CREATININE 1.29* 1.29*  CALCIUM 8.6* 8.6*    PT/INR:  Recent Labs  01/25/16 0411  LABPROT 20.8*  INR 1.80*   ABG    Component Value Date/Time   PHART 7.366 01/12/2016 1529   HCO3 33.4* 01/12/2016 1529   TCO2 41 01/21/2016 0849   ACIDBASEDEF 3.0* 01/11/2016 0728   O2SAT 86.0 01/12/2016 1529   CBG (last 3)  No results for input(s): GLUCAP in the last 72 hours.  Assessment/Plan: S/P Procedure(s) (LRB): CORONARY ARTERY  BYPASS GRAFTING (CABG), ON PUMP, TIMES FOUR, USING LEFT INTERNAL MAMMARY ARTERY, BILATERAL GREATER SAPHENOUS VEINS HARVESTED ENDOSCOPICALLY (N/A) TRANSESOPHAGEAL ECHOCARDIOGRAM (TEE) (N/A) CIRC ARREST AND REPLACEMENT OF ASCENDING THORACIC  ANEURYSM (N/A)  His CXR this am shows recurrent moderate bilateral pleural effusions with lower lobe atelectasis. I think the best option is to insert bilateral pleurX catheters since he has already had two thoracenteses on each side. His pulmonary status is the rate-limiting step in his recovery now. I discussed the procedure with him including the alternative of just doing bilateral thoracentesis again, benefits and risks including but not limited to bleeding, infection, catheter malfunction. He understands and agrees to proceed. Will add on this afternoon.  Hyperkalemia: may be due to Ensure with normal renal function. Will stop it.   Volume excess: continue diuresis.  Coumadin and digoxin for A-fib.   LOS: 30 days    Alleen BorneBryan K Bartle 01/25/2016

## 2016-01-26 ENCOUNTER — Encounter (HOSPITAL_COMMUNITY): Payer: Self-pay | Admitting: Surgery

## 2016-01-26 LAB — BASIC METABOLIC PANEL
ANION GAP: 10 (ref 5–15)
BUN: 35 mg/dL — ABNORMAL HIGH (ref 6–20)
CO2: 37 mmol/L — ABNORMAL HIGH (ref 22–32)
Calcium: 8.2 mg/dL — ABNORMAL LOW (ref 8.9–10.3)
Chloride: 90 mmol/L — ABNORMAL LOW (ref 101–111)
Creatinine, Ser: 1.32 mg/dL — ABNORMAL HIGH (ref 0.61–1.24)
GFR calc Af Amer: >60 mL/min (ref >60)
GFR, EST NON AFRICAN AMERICAN: 55 mL/min — AB (ref >60)
GLUCOSE: 108 mg/dL — AB (ref 65–99)
POTASSIUM: 5.1 mmol/L (ref 3.5–5.1)
Sodium: 137 mmol/L (ref 135–145)

## 2016-01-26 LAB — CBC
HEMATOCRIT: 37.6 % — AB (ref 39.0–52.0)
HEMOGLOBIN: 11.3 g/dL — AB (ref 13.0–17.0)
MCH: 30 pg (ref 26.0–34.0)
MCHC: 30.1 g/dL (ref 30.0–36.0)
MCV: 99.7 fL (ref 78.0–100.0)
Platelets: 183 10*3/uL (ref 150–400)
RBC: 3.77 MIL/uL — ABNORMAL LOW (ref 4.22–5.81)
RDW: 19.5 % — ABNORMAL HIGH (ref 11.5–15.5)
WBC: 8.8 10*3/uL (ref 4.0–10.5)

## 2016-01-26 LAB — PROTIME-INR
INR: 1.83 — AB (ref 0.00–1.49)
Prothrombin Time: 21.1 seconds — ABNORMAL HIGH (ref 11.6–15.2)

## 2016-01-26 LAB — MAGNESIUM: Magnesium: 1.8 mg/dL (ref 1.7–2.4)

## 2016-01-26 LAB — PHOSPHORUS: PHOSPHORUS: 4.3 mg/dL (ref 2.5–4.6)

## 2016-01-26 NOTE — Op Note (Signed)
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 He has had a slow postop course complicated by an episode of sepsis and has had recurrent bilateral pleural effusions probably due to low albumin and possibly a degree of CHF. He has severe COPD and these effusions have been complicating his recovery. He has had thoracentesis twice on each side and the effusions have recurred. Therefore it is felt that the best option is to place bilateral pleurX catheters. I discussed the procedure with him including the alternative of just doing bilateral thoracentesis again, benefits and risks including but not limited to bleeding, infection, catheter malfunction. He understands and agrees to proceed.  Preparation:  The patient was seen in the preoperative holding area and the correct patient, correct operation, correct operative sidewere confirmed with the patient after reviewing the medical record and CT scan. The consent was signed by me. Preoperative antibiotics were given. The patient was taken back to the operating room and positioned supine on the operating room table. After intravenous sedation by anesthesia both sides of the chest were prepped with betadine soap and solution and draped in the usual sterile manner. A surgical time-out was taken and the correct patient,operative side, and operative procedure were confirmed with the nursing and anesthesia staff.   Operative Procedure:   The left PleurX catheter was inserted first.The chest wall entry site was  located on the left lateral chest approximately in the 8th ICS. 1% lidocaine was infiltrated in the skin and subcutaneous tissue down to the pleural space. Serous fluid was encountered. A small incision was made and the left pleural space was entered with a needle catheter. The needle was removed and the guidewire inserted into the pleural space. We could not use fluoroscopy because the arm boards prevented proper placement.  The skin exit site was chosen on the anterior chest wall just above the costal margin and lidocaine was infiltrated here and along a subcutaneous tunnel over to the chest wall entry site. A small incision was made at the skin exit site and the Pleurx catheter was tunneled from the exit site over to the chest wall entry site and positioned with the cuff just inside the exit site. An introducer and sheath were inserted into the pleural space over the guide wire and the introducer and wire removed. The catheter was inserted into the pleural space and the sheath removed. The catheter was connected to vacuum bottle suction and 1550 cc of serous pleural fluid was removed. The catheter was fixed to the skin at the exit site with a nylon suture and the other incision was closed with a 3-0 vicryl subcuticular suture and Dermabond. The catheter was capped and a dressing applied over the exit site.   The right PleurX catheter was inserted next. The chest wall entry site was located on the right lateral chest approximately in the 8th ICS. 1% lidocaine was infiltrated in the skin and subcutaneous tissue down to the pleural space. Serous fluid was encountered. A small incision was made and the right pleural space was entered with a needle catheter.  The needle was removed and the guidewire inserted into the pleural space. We could not use fluoroscopy because the arm boards prevented proper placement.  The skin exit site was chosen on the anterior chest wall just above the costal margin and lidocaine was  infiltrated here and along a subcutaneous tunnel over to the chest wall entry site. A small incision was made at the skin exit site and the Pleurx catheter was tunneled from the exit site over to the chest wall entry site and positioned with the cuff just inside the exit site. An introducer and sheath were inserted into the pleural space over the guide wire and the introducer and wire removed. The catheter was inserted into the pleural space and the sheath removed. The catheter was connected to vacuum bottle suction and 850 cc of serous pleural fluid was removed. The catheter was fixed to the skin at the exit site with a nylon suture and the other incision was closed with a 3-0 vicryl subcuticular suture and Dermabond. The catheter was capped and a dressing applied over the exit site.    The patient tolerated the procedure well. The sponge, needle and instrument counts were correct according to the nurses and the patient was transported to the PACU in stable and satisfactory condition.

## 2016-01-26 NOTE — Significant Event (Signed)
Ambulated approximately 20 feet this evening.

## 2016-01-26 NOTE — Progress Notes (Signed)
1 Day Post-Op Procedure(s) (LRB): INSERTION Bilateral PLEURAL DRAINAGE CATHETER (Bilateral) Subjective:  Feels much better with pleural effusions drained. Slept well  Objective: Vital signs in last 24 hours: Temp:  [96.7 F (35.9 C)-98 F (36.7 C)] 97.5 F (36.4 C) (03/21 0718) Pulse Rate:  [36-115] 111 (03/21 0700) Cardiac Rhythm:  [-] Atrial fibrillation;Bundle branch block (03/21 0400) Resp:  [13-28] 19 (03/21 0700) BP: (81-141)/(55-91) 106/73 mmHg (03/21 0700) SpO2:  [90 %-100 %] 91 % (03/21 0700) Weight:  [65.7 kg (144 lb 13.5 oz)] 65.7 kg (144 lb 13.5 oz) (03/21 0500)  Hemodynamic parameters for last 24 hours:    Intake/Output from previous day: 03/20 0701 - 03/21 0700 In: 373 [P.O.:120; I.V.:253] Out: 4205 [Urine:2450; Blood:5] Intake/Output this shift:    General appearance: alert and cooperative Neurologic: intact Heart: irregularly irregular rhythm Lungs: clear to auscultation bilaterally Extremities: edema mild edema in legs Wound: incisions ok  Lab Results:  Recent Labs  01/25/16 0411 01/26/16 0455  WBC 9.5 8.8  HGB 11.1* 11.3*  HCT 37.7* 37.6*  PLT 186 183   BMET:  Recent Labs  01/25/16 2010 01/26/16 0455  NA 136 137  K 5.9* 5.1  CL 92* 90*  CO2 29 37*  GLUCOSE 112* 108*  BUN 37* 35*  CREATININE 1.18 1.32*  CALCIUM 8.4* 8.2*    PT/INR:  Recent Labs  01/26/16 0455  LABPROT 21.1*  INR 1.83*   ABG    Component Value Date/Time   PHART 7.366 01/12/2016 1529   HCO3 33.4* 01/12/2016 1529   TCO2 41 01/21/2016 0849   ACIDBASEDEF 3.0* 01/11/2016 0728   O2SAT 86.0 01/12/2016 1529   CBG (last 3)  No results for input(s): GLUCAP in the last 72 hours.  Assessment/Plan:  He is symptomatically much better after insertion of PleurX catheters yesterday. Will drain daily for a couple days to see how much comes out.  He remains in atrial fib with controlled rate on digoxin. Holding off on Lopressor with his lung disease.  Continue  coumadin for atrial fib.  IS, flutter valve, ambulation  Attention to nutrition  Volume excess: he has been negative and weight is decreasing.  Hoping that he will continue to progress and can go to CIR.   LOS: 31 days    Alleen BorneBryan K Bartle 01/26/2016

## 2016-01-26 NOTE — Anesthesia Postprocedure Evaluation (Signed)
Anesthesia Post Note  Patient: Craig [name]  Procedure(s) Performed: Procedure(s) (LRB): INSERTION Bilateral PLEURAL DRAINAGE CATHETER (Bilateral)  Patient location during evaluation: PACU Anesthesia Type: MAC Level of consciousness: awake and alert Pain management: pain level controlled Vital Signs Assessment: post-procedure vital signs reviewed and stable Respiratory status: spontaneous breathing Cardiovascular status: stable Anesthetic complications: no    Last Vitals:  Filed Vitals:   01/25/16 2300 01/26/16 0000  BP: 90/55 100/66  Pulse: 97 104  Temp:  36.4 C  Resp: 15 28    Last Pain:  Filed Vitals:   01/26/16 0004  PainSc: 0-No pain                 Lewie LoronJohn Dewana Ammirati

## 2016-01-26 NOTE — Significant Event (Signed)
Drained a total of 1200cc of serosanguinous fluids via bilateral pleural drain (650 out of RIGHT pleurX and 550cc out of LEFT pleurX). Sterile dressing changed done after procedure. Patient tolerated procedure well; received pain medication after first drain.

## 2016-01-26 NOTE — Progress Notes (Signed)
[facility] BRIEF SICU PROGRESS NOTE  1 Day Post-Op  S/P Procedure(s) (LRB): INSERTION Bilateral PLEURAL DRAINAGE CATHETER (Bilateral)   Stable day  Plan: Continue current plan  Purcell Nailslarence H Owen, MD 01/26/2016 6:51 PM

## 2016-01-26 NOTE — Progress Notes (Signed)
Nutrition Follow-up  DOCUMENTATION CODES:   Not applicable  INTERVENTION:   - Continue to encourage good PO intake.  NUTRITION DIAGNOSIS:   Inadequate oral intake related to poor appetite as evidenced by per patient/family report.  Improving  GOAL:   Patient will meet greater than or equal to 90% of their needs  Progressing  MONITOR:   PO intake, Labs, Weight trends, I & O's  ASSESSMENT:   65 y.o. Male with a history of COPD who presents to the ED with complaints of Flu like Symptoms with SOB and Cough and fevers and Chills x 6 days. He had worsening SOB for the past 2 days and Chest Tightness and Chest pain since the AM. He was evalauted in the ED and was hypoxic, to 91% and placed on supplemental NCO2. He was also found to have Pneumonia of the RUL and RLL on CTA of the Chest which was without evidence of a Pulmonary Embolism. He was also found to have an elevated troponin at 2.32. He was placed on IV antibiotic rx for CAP, and Cardiology was consulted and he was referred for admission.   S/p procedure on 3/20: Insertion of bilateral pleural drainage catheter  Patient reports that he is feeling much better since the insertion of the catheter.  Reports that he woke up feeling hungry this morning and ate all of his french toast breakfast, 100% meal completion verified by RN.  States that he feels like he can eat now that he can breathe more easily.  Spoke to RN who reports that pt has not been receiving Ensure due to elevated potassium levels. Encouraged patient to continue to have good po intake to promote healing.  Medications reviewed: lasix.  Labs reviewed.   Diet Order:  Diet heart healthy/carb modified Room service appropriate?: Yes; Fluid consistency:: Thin  Skin:  Wound (see comment) (closed incision - chest and L/R legs)  Last BM:  3/19  Height:   Ht Readings from Last 1 Encounters:  01/18/16 5\' 9"  (1.753 m)    Weight:   Wt Readings from Last 1  Encounters:  01/26/16 144 lb 13.5 oz (65.7 kg)    Ideal Body Weight:  72.7 kg  BMI:  Body mass index is 21.38 kg/(m^2).  Estimated Nutritional Needs:   Kcal:  2000-2200  Protein:  110-120 gm  Fluid:  per MD  EDUCATION NEEDS:   No education needs identified at this time  Doroteo Glassmanoleman Darryll Raju, Dietetic Intern Pager: [phone]

## 2016-01-27 ENCOUNTER — Inpatient Hospital Stay (HOSPITAL_COMMUNITY): Payer: Medicaid Other

## 2016-01-27 DIAGNOSIS — I481 Persistent atrial fibrillation: Secondary | ICD-10-CM

## 2016-01-27 DIAGNOSIS — R570 Cardiogenic shock: Secondary | ICD-10-CM

## 2016-01-27 DIAGNOSIS — I4891 Unspecified atrial fibrillation: Secondary | ICD-10-CM

## 2016-01-27 DIAGNOSIS — I959 Hypotension, unspecified: Secondary | ICD-10-CM

## 2016-01-27 LAB — CBC
HEMATOCRIT: 28.2 % — AB (ref 39.0–52.0)
Hemoglobin: 8.8 g/dL — ABNORMAL LOW (ref 13.0–17.0)
MCH: 30.4 pg (ref 26.0–34.0)
MCHC: 31.2 g/dL (ref 30.0–36.0)
MCV: 97.6 fL (ref 78.0–100.0)
PLATELETS: 153 10*3/uL (ref 150–400)
RBC: 2.89 MIL/uL — AB (ref 4.22–5.81)
RDW: 19.3 % — ABNORMAL HIGH (ref 11.5–15.5)
WBC: 14.5 10*3/uL — AB (ref 4.0–10.5)

## 2016-01-27 LAB — BASIC METABOLIC PANEL
Anion gap: 6 (ref 5–15)
Anion gap: 8 (ref 5–15)
BUN: 35 mg/dL — AB (ref 6–20)
BUN: 38 mg/dL — AB (ref 6–20)
CALCIUM: 8.2 mg/dL — AB (ref 8.9–10.3)
CHLORIDE: 89 mmol/L — AB (ref 101–111)
CO2: 35 mmol/L — ABNORMAL HIGH (ref 22–32)
CO2: 33 mmol/L — ABNORMAL HIGH (ref 22–32)
CREATININE: 1.59 mg/dL — AB (ref 0.61–1.24)
CREATININE: 1.56 mg/dL — AB (ref 0.61–1.24)
Calcium: 8.1 mg/dL — ABNORMAL LOW (ref 8.9–10.3)
Chloride: 89 mmol/L — ABNORMAL LOW (ref 101–111)
GFR calc Af Amer: 52 mL/min — ABNORMAL LOW (ref >60)
GFR, EST AFRICAN AMERICAN: 51 mL/min — AB (ref >60)
GFR, EST NON AFRICAN AMERICAN: 44 mL/min — AB (ref >60)
GFR, EST NON AFRICAN AMERICAN: 45 mL/min — AB (ref >60)
GLUCOSE: 162 mg/dL — AB (ref 65–99)
Glucose, Bld: 171 mg/dL — ABNORMAL HIGH (ref 65–99)
POTASSIUM: 4.8 mmol/L (ref 3.5–5.1)
Potassium: 5.7 mmol/L — ABNORMAL HIGH (ref 3.5–5.1)
SODIUM: 130 mmol/L — AB (ref 135–145)
SODIUM: 130 mmol/L — AB (ref 135–145)

## 2016-01-27 LAB — CARBOXYHEMOGLOBIN
CARBOXYHEMOGLOBIN: 2.3 % — AB (ref 0.5–1.5)
METHEMOGLOBIN: 0.8 % (ref 0.0–1.5)
O2 SAT: 50.5 %
Total hemoglobin: 9 g/dL — ABNORMAL LOW (ref 13.5–18.0)

## 2016-01-27 LAB — PROCALCITONIN: Procalcitonin: 4.24 ng/mL

## 2016-01-27 LAB — LACTIC ACID, PLASMA: LACTIC ACID, VENOUS: 2.2 mmol/L — AB (ref 0.5–2.0)

## 2016-01-27 LAB — ECHOCARDIOGRAM LIMITED
Height: 69 in
WEIGHTICAEL: 2363.33 [oz_av]

## 2016-01-27 LAB — PROTIME-INR
INR: 2 — AB (ref 0.00–1.49)
PROTHROMBIN TIME: 22.5 s — AB (ref 11.6–15.2)

## 2016-01-27 MED ORDER — ALBUMIN HUMAN 5 % IV SOLN
INTRAVENOUS | Status: AC
  Administered 2016-01-27: 12.5 g via INTRAVENOUS
  Filled 2016-01-27: qty 250

## 2016-01-27 MED ORDER — ALBUMIN HUMAN 5 % IV SOLN
INTRAVENOUS | Status: AC
  Filled 2016-01-27: qty 250

## 2016-01-27 MED ORDER — AMIODARONE HCL IN DEXTROSE 360-4.14 MG/200ML-% IV SOLN: 60.0000 mg/h | INTRAVENOUS | Status: DC

## 2016-01-27 MED ORDER — METOPROLOL TARTRATE 25 MG PO TABS
25.0000 mg | ORAL_TABLET | Freq: Two times a day (BID) | ORAL | Status: DC
  Administered 2016-01-28: 25 mg via ORAL
  Filled 2016-01-27 (×2): qty 1

## 2016-01-27 MED ORDER — SODIUM CHLORIDE 0.9% FLUSH
10.0000 mL | Freq: Two times a day (BID) | INTRAVENOUS | Status: DC
  Administered 2016-01-27 – 2016-01-31 (×8): 10 mL

## 2016-01-27 MED ORDER — WARFARIN SODIUM 1 MG PO TABS
1.0000 mg | ORAL_TABLET | Freq: Every day | ORAL | Status: DC
Start: 2016-01-27 — End: 2016-01-29
  Administered 2016-01-27 – 2016-01-28 (×2): 1 mg via ORAL
  Filled 2016-01-27 (×2): qty 1

## 2016-01-27 MED ORDER — SODIUM POLYSTYRENE SULFONATE 15 GM/60ML PO SUSP
15.0000 g | Freq: Once | ORAL | Status: AC
  Administered 2016-01-27: 15 g via ORAL
  Filled 2016-01-27: qty 60

## 2016-01-27 MED ORDER — DEXTROSE 5 % IV SOLN
1.0000 g | INTRAVENOUS | Status: DC
  Filled 2016-01-27: qty 1

## 2016-01-27 MED ORDER — ALBUMIN HUMAN 5 % IV SOLN: 12.5000 g | Freq: Once | INTRAVENOUS | Status: AC

## 2016-01-27 MED ORDER — PHENYLEPHRINE HCL 10 MG/ML IJ SOLN
0.0000 ug/min | INTRAVENOUS | Status: DC
  Administered 2016-01-27: 20 ug/min via INTRAVENOUS
  Filled 2016-01-27 (×3): qty 1

## 2016-01-27 MED ORDER — ALBUMIN HUMAN 5 % IV SOLN: 12.5000 g | Freq: Once | INTRAVENOUS | Status: DC

## 2016-01-27 MED ORDER — AMIODARONE HCL IN DEXTROSE 360-4.14 MG/200ML-% IV SOLN
INTRAVENOUS | Status: AC
  Filled 2016-01-27: qty 200

## 2016-01-27 MED ORDER — ALBUMIN HUMAN 25 % IV SOLN: 12.5000 g | Freq: Once | INTRAVENOUS | Status: DC

## 2016-01-27 MED ORDER — VANCOMYCIN HCL IN DEXTROSE 1-5 GM/200ML-% IV SOLN
1000.0000 mg | INTRAVENOUS | Status: DC
  Filled 2016-01-27: qty 200

## 2016-01-27 MED ORDER — DEXTROSE 5 % IV SOLN
2.0000 g | INTRAVENOUS | Status: DC
  Administered 2016-01-27 – 2016-01-29 (×3): 2 g via INTRAVENOUS
  Filled 2016-01-27 (×4): qty 2

## 2016-01-27 MED ORDER — SODIUM CHLORIDE 0.9% FLUSH
10.0000 mL | INTRAVENOUS | Status: DC | PRN
Start: 2016-01-27 — End: 2016-02-03
  Administered 2016-02-01: 40 mL
  Administered 2016-02-01: 20 mL
  Administered 2016-02-02 – 2016-02-03 (×3): 10 mL
  Filled 2016-01-27 (×5): qty 40

## 2016-01-27 MED ORDER — VANCOMYCIN HCL 10 G IV SOLR
1500.0000 mg | Freq: Once | INTRAVENOUS | Status: AC
  Administered 2016-01-27: 1500 mg via INTRAVENOUS
  Filled 2016-01-27: qty 1500

## 2016-01-27 MED ORDER — NOREPINEPHRINE BITARTRATE 1 MG/ML IV SOLN
0.0000 ug/min | INTRAVENOUS | Status: DC
  Administered 2016-01-27: 6 ug/min via INTRAVENOUS
  Filled 2016-01-27 (×2): qty 4

## 2016-01-27 MED ORDER — ALBUMIN HUMAN 5 % IV SOLN
12.5000 g | Freq: Once | INTRAVENOUS | Status: AC
  Administered 2016-01-27: 12.5 g via INTRAVENOUS
  Filled 2016-01-27: qty 250

## 2016-01-27 NOTE — Progress Notes (Signed)
   Subjective: PT comfortable  Denies SOB  No CP   Objective: Filed Vitals:   01/27/16 1550 01/27/16 1555 01/27/16 1600 01/27/16 1605  BP: 99/65 99/56 96/65  98/72  Pulse: 100 105 103 99  Temp:      TempSrc:      Resp: 25 19 26 21   Height:      Weight:      SpO2: 95% 96% 96% 95%   Weight change: 2 lb 13.9 oz (1.3 kg)  Intake/Output Summary (Last 24 hours) at 01/27/16 1619 Last data filed at 01/27/16 1300  Gross per 24 hour  Intake   2070 ml  Output   1800 ml  Net    270 ml    General: Alert, awake, oriented x3, in no acute distress Neck:  JVP is normal Heart: Irreg Irreg  No S3   Lungs: Rhonchi  Decreased BS at bases   Exemities:  No edema.   Neuro: Grossly intact, nonfocal.  Tele:  Afib  100s  Lab Results: Results for orders placed or performed during the hospital encounter of 12/26/15 (from the past 24 hour(s))  Protime-INR     Status: Abnormal   Collection Time: 01/27/16  2:50 AM  Result Value Ref Range   Prothrombin Time 22.5 (H) 11.6 - 15.2 seconds   INR 2.00 (H) 0.00 - 1.49  Basic metabolic panel     Status: Abnormal   Collection Time: 01/27/16  2:50 AM  Result Value Ref Range   Sodium 130 (L) 135 - 145 mmol/L   Potassium 5.7 (H) 3.5 - 5.1 mmol/L   Chloride 89 (L) 101 - 111 mmol/L   CO2 35 (H) 22 - 32 mmol/L   Glucose, Bld 171 (H) 65 - 99 mg/dL   BUN 35 (H) 6 - 20 mg/dL   Creatinine, Ser 1.611.59 (H) 0.61 - 1.24 mg/dL   Calcium 8.2 (L) 8.9 - 10.3 mg/dL   GFR calc non Af Amer 44 (L) >60 mL/min   GFR calc Af Amer 51 (L) >60 mL/min   Anion gap 6 5 - 15  Procalcitonin     Status: None   Collection Time: 01/27/16  1:11 PM  Result Value Ref Range   Procalcitonin 4.24 ng/mL  Lactic acid, plasma     Status: Abnormal   Collection Time: 01/27/16  1:11 PM  Result Value Ref Range   Lactic Acid, Venous 2.2 (HH) 0.5 - 2.0 mmol/L    Studies/Results: No results found.  Medications: Reviewed     @PROBHOSP @  1  Atrial fibrillation  Pt has been in atrial  fibrillation  HR earlier today high  Now around 100    2  Hypotension  Pt developed hypotension earlier today  Now on pressors  Hgb OK earlier today.   Would recomm limited echo to evaluate LVEF and r/o effusion.   Agree with holding back on diuresis.    LOS: 32 days   Dietrich Patesaula Ravinder Hofland 01/27/2016, 4:19 PM

## 2016-01-27 NOTE — Progress Notes (Signed)
PT Cancellation Note  Patient Details Name: Craig FeinsteinRobert S Rawe MRN: [id number] DOB: 03/10/1951   Cancelled Treatment:    Reason Eval/Treat Not Completed: Medical issues which prohibited therapy Holding PT tx per request of RN as pt with soft BP. Reading 74/59. Will follow up.   Blake DivineShauna A Tenzin Edelman 01/27/2016, 1:19 PM  Mylo RedShauna Heman Que, PT, DPT [phone]

## 2016-01-27 NOTE — Care Management Note (Signed)
Case Management Note  Patient Details  Name: Craig [name] MRN: [id number] Date of Birth: 05/17/1951  Subjective/Objective:           Patient Details  Name: Craig FeinsteinRobert [name] [name] MRN: [id number] Date of Birth: 02/07/1951  Subjective/Objective:        Pt admitted with Acute Respiratory Failure    Action/Plan:   01/27/2016  Pt is [name]/p Bilateral Pluerx Catheters - at this time it is undetermined if pt will discharge with catheters; CM will continue to follow for discharge needs.  CIR continues to follow pt as well as CSW .  Craig FavreJasmine [name] Counselor -  FC will come to pts room tomorrow tentatively 10am to facilitate Medicaid application, Ms Craig SableWoodward also arranged for disability consideration via the service center.  Sister made aware.  CM received call from family member; family stated pts requesting discharge to sisters home with 24 hour care provided by sister if not accepted into CIR.  CM went back to speak with pt; pt now concurs that he would prefer to discharge to sisters care if not accepted into CIR.  CM spoke with PT prior to tentative therapy session;  Per last completed session of PT evaluation of pt; at this point recommendation would be for SNF if unable to discharge to CIR,   PT will continue to follow as discharge needs may change as pt progresses,  Pt declined PT evaluation for today.  CM contacted financial consuelor Crecencio McJasmine [name] [phone] to inquire if pt has been reassessed for medicaid eligibility post surgery - left voicemail.  CM spoke with pt regarding recommended discharge plan CIR vs SNF; pt did not voice concerns to either option,  Correction to previous entry; son'[name] name is Craig SpareSteven not [name].  CM will continue to monitor  01/20/16 Pt still experiencing respiratory issues and remains in ICU - [place] for 24 hours.  Recommendations have been placed for CIR.  CSW consulted for SNF backup plan.  CM will continue to monitor for disposition needs  01/08/16 Pt is  [name]  01/06/16 Pt is independent from home with son Craig LeatherwoodJarrett.  Pt is without insurance; will need PCP initiated at free clinic prior to discharge. CM will continue to monitor for disposition needs   Expected Discharge Date:                  Expected Discharge Plan:  Home/Self Care  In-House Referral:     Discharge planning Services  CM Consult  Post Acute Care Choice:    Choice offered to:     DME Arranged:    DME Agency:     HH Arranged:    HH Agency:     Status of Service:  In process, will continue to follow  Medicare Important Message Given:    Date Medicare IM Given:    Medicare IM give by:    Date Additional Medicare IM Given:    Additional Medicare Important Message give by:     If discussed at Long Length of Stay Meetings, dates discussed:  01/05/16, 01/07/16  Additional Comments:  01/06/16- 1145- Craig PieriniKristi Webster RN, BSN- plan for CABG/AVR tomorrow 3/2- CM will follow post op for d/c needs  Craig ParrClaxton, Craig [name] S, RN 01/27/2016, 11:59 AM           Action/Plan:   Expected Discharge Date:                  Expected Discharge Plan:  Home/Self Care  In-House Referral:  Discharge planning Services  CM Consult  Post Acute Care Choice:    Choice offered to:     DME Arranged:    DME Agency:     HH Arranged:    HH Agency:     Status of Service:  In process, will continue to follow  Medicare Important Message Given:    Date Medicare IM Given:    Medicare IM give by:    Date Additional Medicare IM Given:    Additional Medicare Important Message give by:     If discussed at Long Length of Stay Meetings, dates discussed:    Additional Comments:  Craig Parr, RN 01/27/2016, 11:58 AM

## 2016-01-27 NOTE — Progress Notes (Signed)
Patient ID: Craig FeinsteinRobert S Veselka, male   DOB: 12/18/1950, 65 y.o.   MRN: [id number]  SICU Evening Rounds:  He developed hypotension into the 70's this am and his HR increased into the 130's at rest which is a change for him. Creat was up slightly to 1.59 and K+ 5.7. He felt fine but he received 750 cc of albumin without rise in his BP. He was started on neo this afternoon and is on 70 mcg. His procalcitonin is 4.24 and lactic acid 2.2. WBC ct is up to 14.5 from 8.8 two days ago. Hgb down to 8.8 after fluid today. He had an echo this afternoon that I have reviewed and EF looks fairly normal with normal valve function, normal RV function, small insignificant pericardial effusion. I really suspect sepsis. His only positive culture has been yeast in his urine and he has been on diflucan. He has had no invasive lines and foley has been out. Lungs are always a possible source.   Co-ox 50.5%  CVP 11 He feels well and looks comfortable. Lungs fairly clear upper lobes. Crackles in bases Heart IRRR, systolic flow murmur unchanged. Abdomen benign Moderate edema in legs. Feet pink and warm.  Seen by CCM. I agree with culturing and starting broad spectrum antibiotics. This same thing happened two weeks ago and progressed rapidly with shock liver, renal failure, intubation. Luckily he rapidly turned around with antibiotics but we never knew what we were treating.

## 2016-01-27 NOTE — Progress Notes (Signed)
Echocardiogram 2D Echocardiogram limited has been performed.  Nolon RodBrown, Tony 01/27/2016, 4:51 PM

## 2016-01-27 NOTE — Progress Notes (Signed)
PCCM PROGRESS NOTE  ADMISSION DATE: 12/26/2015 CONSULT DATE: 12/27/2015 REFERRING PROVIDER: [facility]  CC: Short of breath   CULTURES: 2/19 Respiratory viral panel >> negative 2/19 Sputum >> oral flora 2/19 Pneumococcal ag >> negative 3/06 Blood >> NTD  ANTIBIOTICS: 2/18 Zithromax >> 2/21 2/18 Rocephin >> 2/24 3/06 Imipenem >>3/12 3/06 Vancomycin >>3/12 3/17 fluconazole>>>  LINES/TUBES: 2/19 ETT >> 2/19 (self extubated) 3/02 ETT >> 3/03 3/02 Rt radial a line >>3/04 3/02 Rt IJ introducer >>3/04 3/06 ETT >> 3/07 3/06 Lt [place] CVL 3/06 >> 3/06 Lt femoral CVL >> 3/08 3/20 bilateral pleurex catheters  STUDIES:  EVENTS: 2/18 Admit, cardiology consulted 2/18 CT chest >> emphysema, consolidation RLL 2/19 To ICU, VDRF 2/21 Rt/Lt heart cath, TCTS consulted 2/21 Rt/Lt heart cath, TCTS consulted 2/20 Echo >> EF 35 to 40%, severe AS, 42 mm aortic root, mild MR 2/26 - PAF 2/27 PFT >> FEV1 1.26 (37%), FEV1% 34, TLC 6.17 (90%), DLCO 26%, no BD 3/02 CABG, AVR, replacement of ascending thoracic aortic aneurysm 3/03 Transfuse PRBC 3/06 resp failure, lactic acidosis, ARF, shock liver, ETT 3/06 Echo >> normal LV/RV systolic fx 3/06 Abd u/s >> GB wall thickening w/o gallstones 3/06 CT abd/pelvis >> GB sludge, non obstructive renal stones b/l, diverticulosis 3/06 CT chest >> b/l effusions, centrilobular and paraseptal emphysema, compressive ATX 3/08 Off pressors 3/11 Rt thoracentesis >> 1400 ml fluid 3/14 remains very weak. 3/17 better 3/20 to OR for bilateral pleurex catheter placement. 3/22 back on pressors   SUBJECTIVE: AFib with RVR this am.  Back on pressors.  Echo pending.   VITAL SIGNS: BP 85/56 mmHg  Pulse 104  Temp(Src) 97.9 F (36.6 C) (Oral)  Resp 23  Ht  (1.753 m)  Wt 67 kg (147 lb 11.3 oz)  BMI 21.80 kg/m2  SpO2 93%  INTAKE/OUTPUT: I/O last 3 completed shifts: In: 1070 [P.O.:720; I.V.:350] Out: 5605 [Urine:2650; Other:1750; Blood:5; Chest  Tube:1200]  EXAM:   General: sitting up in chair, comfortable LOOKS VERY DECONDITIONED. HEENT: no stridor, [place]/AT, PERRL, EOM-I and MMM. Cardiac: RRR, 2/6 SM Chest: Bibasilar crackles.  Bilateral pleurex catheter. Abd: soft, mildly tender, ND and +BS Ext: no edema and -tenderness. Neuro: alert, follows commands. Skin: no rashes.  PULMONARY  Recent Labs Lab 01/21/16 0849  TCO2 41   CBC  Recent Labs Lab 01/22/16 0513 01/25/16 0411 01/26/16 0455  HGB 11.9* 11.1* 11.3*  HCT 37.5* 37.7* 37.6*  WBC 13.9* 9.5 8.8  PLT 127* 186 183   COAGULATION  Recent Labs Lab 01/23/16 0438 01/24/16 0724 01/25/16 0411 01/26/16 0455 01/27/16 0250  INR 1.96* 1.51* 1.80* 1.83* 2.00*   CARDIAC  No results for input(s): TROPONINI in the last 168 hours. No results for input(s): PROBNP in the last 168 hours.  CHEMISTRY  Recent Labs Lab 01/22/16 0513 01/23/16 0438 01/24/16 0724 01/25/16 0411 01/25/16 2010 01/26/16 0455 01/27/16 0250  NA 138 141 137 139 136 137 130*  K 5.2* 6.5* 4.7 5.5* 5.9* 5.1 5.7*  CL 91* 91* 89* 90* 92* 90* 89*  CO2 36* 38* 37* 39* 29 37* 35*  GLUCOSE 132* 98 113* 128* 112* 108* 171*  BUN 49* 46* 41* 38* 37* 35* 35*  CREATININE 1.26* 1.32* 1.29* 1.29* 1.18 1.32* 1.59*  CALCIUM 8.4* 8.9 8.6* 8.6* 8.4* 8.2* 8.2*  MG 1.9 2.0 1.8 1.8  --  1.8  --   PHOS 3.1 3.1 4.2 4.0  --  4.3  --    Estimated Creatinine Clearance: 44.5 mL/min (by C-G  formula based on Cr of 1.59).  LIVER  Recent Labs Lab 01/22/16 0513 01/23/16 0438 01/24/16 0724 01/25/16 0411 01/26/16 0455 01/27/16 0250  AST 67*  --   --  77*  --   --   ALT 145*  --   --  130*  --   --   ALKPHOS 94  --   --  101  --   --   BILITOT 1.4*  --   --  1.2  --   --   PROT 4.8*  --   --  5.2*  --   --   ALBUMIN 2.2*  --   --  2.2*  --   --   INR 2.06* 1.96* 1.51* 1.80* 1.83* 2.00*     INFECTIOUS  Recent Labs Lab 01/27/16 1311  LATICACIDVEN 2.2*  PROCALCITON 4.24     ENDOCRINE CBG (last 3)   No results for input(s): GLUCAP in the last 72 hours.       IMAGING x48h  - image(s) personally visualized  -   highlighted in bold Dg Chest Port 1 View  01/25/2016  CLINICAL DATA:  Post insertion of bilateral pleural drainage catheters. EXAM: PORTABLE CHEST 1 VIEW COMPARISON:  01/25/2016 FINDINGS: Postoperative changes in the mediastinum. Cardiac enlargement without vascular congestion. Bilateral pleural effusions. Bilateral chest tubes are present. Pleural effusions demonstrate mild decreased since previous study. Persistent atelectasis in the lung bases, greater on the right. Old bilateral rib fractures. Calcified and tortuous aorta. No pneumothorax. IMPRESSION: Mild decrease of bilateral pleural effusions after bilateral chest tube placements. Atelectasis in the lung bases, greater on the right. Electronically Signed   By: Burman NievesWilliam  Stevens M.D.   On: 01/25/2016 19:51   Dg C-arm 1-60 Min-no Report  01/25/2016  CLINICAL DATA: protocol C-ARM 1-60 MINUTES Fluoroscopy was utilized by the requesting physician.  No radiographic interpretation.    DISCUSSION: 65 yo male former smoker with acute on chronic respiratory failure r/t AECOPD +/- PNA and bilateral pleural effusions c/b NSTEMI, severe AS. S/p bilateral pleur-x 3/21. Course c/b AFib/flutter and hyperkalemia.    ASSESSMENT/PLAN:  PULMONARY A: Acute on chronic hypoxic/hypercapnic respiratory failure >> much improved after Rt thoracentesis 3/11. COPD/emphysema with exacerbation. B/l pleural effusions. Recurring pleural effusion. P: Oxygen to keep SpO2 90 to 95%. Continue brovana, pulmicort, spiriva PRN albuterol Mobilize, pulm hygiene  Drain pleur-x daily   CARDIOLOGY A: NSTEMI. Severe AS and severe CAD s/p CABG and AVR 3/02. Acute on chronic systolic/diastolic CHF. HLD. Afib RVR  Hypotension  P: Cards to see  Echo pending  amio gtt  Continue coumadin per pharmacy  INR per pharm  RENAL A: ARF likely from ATN   Hyperkalemia. P: Kayexalate now  F/u chem this pm  Monitor renal fx, urine outpt, electrolytes Replace electrolytes as indicated Hold further diuresis for now  GASTROENTEROLOGY A: Nutrition. Shock liver >> LFTs trending down. P: Started regular diet and feeling better.  HEMATOLOGY A: Anemia of critical illness. Thrombocytopenia. P: F/u CBC  INFECTION A: CAP >> completed Abx 2/24. Septic shock 3/06 >> possible sources are line vs respiratory vs gallbladder >> improving. P: Monitor wbc, fever curve off abx   ENDOCRINE A: Hyperglycemia. P: SSI  NEUROLOGY A: Post-op pain control. Deconditioning. P: Prn fentanyl Mobilize as able PT/OT    Dirk DressKaty Whiteheart, NP 01/27/2016  3:33 PM Pager: [phone] or [phone]  Attending Note:  65 year old male who is severely deconditioned.  This morning went into a-fib with RVR and  subsequent hypotension.  New was started.  It is interesting that loss of the atrial kick in a patient with a normal EF.  Possibility here is an MI or pericardial effusion.  Patient's lungs are poor.  I discussed case with cardiology, will start low dose amio drip without a bolus and check a limited echo.  Will check CVP and neo for BP support.  Will not cardiovert.  On exam, patient is lethargic but stable.  Patient is also hyperkalemic and will give Kayexalate.  The patient is critically ill with multiple organ systems failure and requires high complexity decision making for assessment and support, frequent evaluation and titration of therapies, application of advanced monitoring technologies and extensive interpretation of multiple databases.   Critical Care Time devoted to patient care services described in this note is  35  Minutes. This time reflects time of care of this signee Dr Koren Bound. This critical care time does not reflect procedure time, or teaching time or supervisory time of PA/NP/Med student/Med Resident etc but could involve  care discussion time.  Alyson Reedy, M.D. [facility] Pulmonary/Critical Care Medicine. Pager: [phone]. After hours pager: [phone].

## 2016-01-27 NOTE — Progress Notes (Signed)
Neo-synephrine gtt. Started at 20 mcg/min.

## 2016-01-27 NOTE — Progress Notes (Signed)
Peripherally Inserted Central Catheter/Midline Placement  The IV Nurse has discussed with the patient and/or persons authorized to consent for the patient, the purpose of this procedure and the potential benefits and risks involved with this procedure.  The benefits include less needle sticks, lab draws from the catheter and patient may be discharged home with the catheter.  Risks include, but not limited to, infection, bleeding, blood clot (thrombus formation), and puncture of an artery; nerve damage and irregular heat beat.  Alternatives to this procedure were also discussed.  PICC/Midline Placement Documentation        Maximino GreenlandLumban, Mescal Flinchbaugh Albarece 01/27/2016, 2:27 PM

## 2016-01-27 NOTE — Progress Notes (Signed)
2 Days Post-Op Procedure(s) (LRB): INSERTION Bilateral PLEURAL DRAINAGE CATHETER (Bilateral) Subjective:  No complaints  Nurse reports that he walked to nursing station and HR went up to 160's  Objective: Vital signs in last 24 hours: Temp:  [97.5 F (36.4 C)-98.3 F (36.8 C)] 98.3 F (36.8 C) (03/22 0400) Pulse Rate:  [92-138] 131 (03/22 0900) Cardiac Rhythm:  [-] Atrial fibrillation (03/22 0800) Resp:  [13-32] 27 (03/22 0900) BP: (78-129)/(61-96) 95/61 mmHg (03/22 0800) SpO2:  [92 %-100 %] 93 % (03/22 0900) Weight:  [67 kg (147 lb 11.3 oz)] 67 kg (147 lb 11.3 oz) (03/22 0500)  Hemodynamic parameters for last 24 hours:    Intake/Output from previous day: 03/21 0701 - 03/22 0700 In: 700 [P.O.:600; I.V.:100] Out: 2650 [Urine:1450; Chest Tube:1200] Intake/Output this shift:    General appearance: alert and cooperative Neurologic: intact Heart: irregularly irregular rhythm Lungs: clear to auscultation bilaterally Abdomen: soft, non-tender; bowel sounds normal; no masses,  no organomegaly Extremities: edema moderate bilateral leg edema Wound: incisions healing well  Lab Results:  Recent Labs  01/25/16 0411 01/26/16 0455  WBC 9.5 8.8  HGB 11.1* 11.3*  HCT 37.7* 37.6*  PLT 186 183   BMET:  Recent Labs  01/26/16 0455 01/27/16 0250  NA 137 130*  K 5.1 5.7*  CL 90* 89*  CO2 37* 35*  GLUCOSE 108* 171*  BUN 35* 35*  CREATININE 1.32* 1.59*  CALCIUM 8.2* 8.2*    PT/INR:  Recent Labs  01/27/16 0250  LABPROT 22.5*  INR 2.00*   ABG    Component Value Date/Time   PHART 7.366 01/12/2016 1529   HCO3 33.4* 01/12/2016 1529   TCO2 41 01/21/2016 0849   ACIDBASEDEF 3.0* 01/11/2016 0728   O2SAT 86.0 01/12/2016 1529   CBG (last 3)  No results for input(s): GLUCAP in the last 72 hours.  Assessment/Plan: S/P Procedure(s) (LRB): INSERTION Bilateral PLEURAL DRAINAGE CATHETER (Bilateral)  He is hemodynamically stable but looks like he may be in atrial flutter  this am with HR 130's at rest. Will check ECG. Continue digoxin and add lopressor for rate control. Did not use amio in his case due to postop sepsis and shock liver, severe lung disease.  Recurrent bilateral pleural effusions: drain PleurX catheters daily.  Hyperkalemia: persists despite stopping Ensure. His creat is up today and he may be getting a little dehydrated. Will stop diuretic for now and follow.  Therapeutic on coumadin.   Continue mobilization, IS  Check CMET, CBC, digoxin level in am. CXR in am.   LOS: 32 days    Alleen BorneBryan K Bartle 01/27/2016

## 2016-01-27 NOTE — Progress Notes (Signed)
Amiodarone gtt. Held as per Dr. Tenny Crawoss d/t concerns of multi-organ symptoms.

## 2016-01-27 NOTE — Progress Notes (Signed)
Pharmacy Antibiotic Note  Craig FeinsteinRobert S [name] is a 65 y.o. male admitted on 12/26/2015 with ACS > CABG x4 and AVR. He dropped his pressure today and started on phenylephrine now improved BP.  COOX 50 despite post op EF improved - spoke to MD will change PE to NE.   Pharmacy has been consulted for vancomycin and Cefepime dosing.  WBC has bumped wnl> 14, PCT elevated 1> 4, Cr 1.3>1.6.    Plan: Vancomycin 1500mg  x1 then 1gm q24 VT was 16 on this dose last week Cefepime 2gm q24  Height: 5\' 9"  (175.3 cm) Weight: 147 lb 11.3 oz (67 kg) IBW/kg (Calculated) : 70.7  Temp (24hrs), Avg:98.5 F (36.9 C), Min:97.9 F (36.6 C), Max:100.5 F (38.1 C)   Recent Labs Lab 01/21/16 0223  01/22/16 0513  01/24/16 0724 01/25/16 0411 01/25/16 2010 01/26/16 0455 01/27/16 0250 01/27/16 1311 01/27/16 1707  WBC 17.1*  --  13.9*  --   --  9.5  --  8.8  --   --  14.5*  CREATININE 1.34*  < > 1.26*  < > 1.29* 1.29* 1.18 1.32* 1.59*  --   --   LATICACIDVEN  --   --   --   --   --   --   --   --   --  2.2*  --   < > = values in this interval not displayed.  Estimated Creatinine Clearance: 44.5 mL/min (by C-G formula based on Cr of 1.59).    No Known Allergies  Antimicrobials this admission: azith 2/18>2/19 Ceftriaxone 2/18>2/24 Fluconazole 3/17>324 vanc 3/6>3/13 Dose adjustments this admission:   Microbiology results: 3/22 Ucx 3/22 BCx 3/15 UCx: yeast  3/12 pleural fluid NGF  Leota SauersLisa Kaisei Gilbo Pharm.D. CPP, BCPS Clinical Pharmacist [phone] 01/27/2016 6:23 PM

## 2016-01-27 NOTE — Progress Notes (Signed)
CVP 11, echo normal, remains hypotensive, only explanation is a drop in SVR which early sepsis is a possibility.  Procalcitonin is also elevated to 4 from 1.  Will order broad spectrum abx and pan culture and get another set of procalcitonins.  If normal in AM and BP is improving then will stop abx.  Alyson ReedyWesam G. Yalena Colon, M.D. [facility] Pulmonary/Critical Care Medicine. Pager: [phone]. After hours pager: [phone].

## 2016-01-28 ENCOUNTER — Inpatient Hospital Stay (HOSPITAL_COMMUNITY): Payer: Medicaid Other

## 2016-01-28 DIAGNOSIS — I480 Paroxysmal atrial fibrillation: Secondary | ICD-10-CM | POA: Insufficient documentation

## 2016-01-28 DIAGNOSIS — A419 Sepsis, unspecified organism: Secondary | ICD-10-CM | POA: Insufficient documentation

## 2016-01-28 DIAGNOSIS — Z951 Presence of aortocoronary bypass graft: Secondary | ICD-10-CM | POA: Insufficient documentation

## 2016-01-28 LAB — CBC
HCT: 29.2 % — ABNORMAL LOW (ref 39.0–52.0)
Hemoglobin: 9.1 g/dL — ABNORMAL LOW (ref 13.0–17.0)
MCH: 30.2 pg (ref 26.0–34.0)
MCHC: 31.2 g/dL (ref 30.0–36.0)
MCV: 97 fL (ref 78.0–100.0)
Platelets: 126 10*3/uL — ABNORMAL LOW (ref 150–400)
RBC: 3.01 MIL/uL — ABNORMAL LOW (ref 4.22–5.81)
RDW: 18.9 % — AB (ref 11.5–15.5)
WBC: 13.1 10*3/uL — ABNORMAL HIGH (ref 4.0–10.5)

## 2016-01-28 LAB — COMPREHENSIVE METABOLIC PANEL
ALBUMIN: 2.1 g/dL — AB (ref 3.5–5.0)
ALT: 80 U/L — ABNORMAL HIGH (ref 17–63)
ANION GAP: 8 (ref 5–15)
AST: 53 U/L — ABNORMAL HIGH (ref 15–41)
Alkaline Phosphatase: 69 U/L (ref 38–126)
BUN: 34 mg/dL — ABNORMAL HIGH (ref 6–20)
CO2: 35 mmol/L — AB (ref 22–32)
Calcium: 7.9 mg/dL — ABNORMAL LOW (ref 8.9–10.3)
Chloride: 91 mmol/L — ABNORMAL LOW (ref 101–111)
Creatinine, Ser: 1.35 mg/dL — ABNORMAL HIGH (ref 0.61–1.24)
GFR calc non Af Amer: 54 mL/min — ABNORMAL LOW (ref >60)
GLUCOSE: 114 mg/dL — AB (ref 65–99)
POTASSIUM: 4 mmol/L (ref 3.5–5.1)
SODIUM: 134 mmol/L — AB (ref 135–145)
TOTAL PROTEIN: 4.7 g/dL — AB (ref 6.5–8.1)
Total Bilirubin: 1.4 mg/dL — ABNORMAL HIGH (ref 0.3–1.2)

## 2016-01-28 LAB — PROTIME-INR
INR: 2.15 — AB (ref 0.00–1.49)
PROTHROMBIN TIME: 23.9 s — AB (ref 11.6–15.2)

## 2016-01-28 LAB — DIGOXIN LEVEL: Digoxin Level: 0.6 ng/mL — ABNORMAL LOW (ref 0.8–2.0)

## 2016-01-28 LAB — PROCALCITONIN: PROCALCITONIN: 5.74 ng/mL

## 2016-01-28 LAB — CARBOXYHEMOGLOBIN
CARBOXYHEMOGLOBIN: 2.1 % — AB (ref 0.5–1.5)
METHEMOGLOBIN: 1 % (ref 0.0–1.5)
O2 SAT: 53.2 %
Total hemoglobin: 9.4 g/dL — ABNORMAL LOW (ref 13.5–18.0)

## 2016-01-28 MED ORDER — VANCOMYCIN HCL 500 MG IV SOLR
500.0000 mg | Freq: Two times a day (BID) | INTRAVENOUS | Status: DC
  Administered 2016-01-28 – 2016-01-30 (×5): 500 mg via INTRAVENOUS
  Filled 2016-01-28 (×6): qty 500

## 2016-01-28 MED ORDER — METOPROLOL TARTRATE 12.5 MG HALF TABLET
12.5000 mg | ORAL_TABLET | Freq: Two times a day (BID) | ORAL | Status: DC
  Administered 2016-01-28 – 2016-01-31 (×6): 12.5 mg via ORAL
  Filled 2016-01-28 (×6): qty 1

## 2016-01-28 MED ORDER — TRAMADOL HCL 50 MG PO TABS
50.0000 mg | ORAL_TABLET | ORAL | Status: DC | PRN
  Administered 2016-01-28 – 2016-01-30 (×4): 50 mg via ORAL
  Filled 2016-01-28 (×4): qty 1

## 2016-01-28 NOTE — Progress Notes (Signed)
3 Days Post-Op Procedure(s) (LRB): INSERTION Bilateral PLEURAL DRAINAGE CATHETER (Bilateral) Subjective:  Seen this am but just getting around to writing notes. Just saw him again and he is feeling better. Ate good breakfast but had to go without lunch to have RUQ [place] which is going to be done soon. Ambulated further. Breathing is good. Oxygen weaned to 1L but dropped into the 80's off oxygen.  Objective: Vital signs in last 24 hours: Temp:  [97.4 F (36.3 C)-98 F (36.7 C)] 97.4 F (36.3 C) (03/23 1500) Pulse Rate:  [76-136] 136 (03/23 1700) Cardiac Rhythm:  [-] Atrial fibrillation (03/23 1700) Resp:  [9-28] 26 (03/23 1700) BP: (84-121)/(58-76) 102/59 mmHg (03/23 1700) SpO2:  [88 %-100 %] 100 % (03/23 1700) Weight:  [68 kg (149 lb 14.6 oz)] 68 kg (149 lb 14.6 oz) (03/23 0500)  Hemodynamic parameters for last 24 hours: CVP:  [4 mmHg-13 mmHg] 7 mmHg  Intake/Output from previous day: 03/22 0701 - 03/23 0700 In: 2841.1 [P.O.:1200; I.V.:341.1; IV Piggyback:1300] Out: 2025 [Urine:1200; Chest Tube:825] Intake/Output this shift: Total I/O In: 340 [P.O.:240; IV Piggyback:100] Out: 1350 [Urine:400; Chest Tube:950]  General appearance: alert and cooperative Neurologic: intact Heart: irregular rate and rhythm, no murmur Lungs: crackle sin bases. Abdomen: soft, non-tender; bowel sounds normal; no masses,  no organomegaly Extremities: edema leg edema decreasing. Wound: incisions ok  Lab Results:  Recent Labs  01/27/16 1707 01/28/16 0510  WBC 14.5* 13.1*  HGB 8.8* 9.1*  HCT 28.2* 29.2*  PLT 153 126*   BMET:  Recent Labs  01/27/16 1707 01/28/16 0510  NA 130* 134*  K 4.8 4.0  CL 89* 91*  CO2 33* 35*  GLUCOSE 162* 114*  BUN 38* 34*  CREATININE 1.56* 1.35*  CALCIUM 8.1* 7.9*    PT/INR:  Recent Labs  01/28/16 0510  LABPROT 23.9*  INR 2.15*   ABG    Component Value Date/Time   PHART 7.366 01/12/2016 1529   HCO3 33.4* 01/12/2016 1529   TCO2 41 01/21/2016 0849   ACIDBASEDEF 3.0* 01/11/2016 0728   O2SAT 53.2 01/28/2016 0622   CBG (last 3)  No results for input(s): GLUCAP in the last 72 hours.  CLINICAL DATA: Shortness breath. Pleural effusion.  EXAM: PORTABLE CHEST - 1 VIEW  COMPARISON: One-view chest x-ray 01/27/2016.  FINDINGS: The heart is mildly enlarged. Bilateral pleural effusions are again noted. The right-sided PICC line is stable. Right greater than left lower lobe airspace disease is noted as well. There is no significant interval change. Remote bilateral rib fractures are present.  IMPRESSION: 1. Stable appearance of bilateral pleural effusions and lower lobe airspace disease, right greater than left. 2. Bilateral chest tubes remain in place. 3. Stable right-sided PICC line.   Electronically Signed  By: Marin [name] M.D.  On: 01/28/2016 07:41   Assessment/Plan:  He looks much better today. He is off levophed with acceptable BP. Remains in atrial fib with HR 80's. Tolerated 25 mg lopressor this am. With borderline BP will decrease to 12.5 mg and continue digoxin for rate control.  His procalcitonin was 5.74 this am and sepsis suspected as cause of his hypotension yesterday. Started on Maxipime and vancomycin empirically. Cultures pending. WBC ct decreased and remains afebrile. Source unclear. His incisions are well-healed. Valves looked fine on echo. He could have lower lobe pneumonia. Getting RUQ [place] to follow up on GB which was distended with some thickening and edema on previous echo early postop when he got septic.   Renal function improved. Holding off  on further diuresis.  I don't think that this was a cardiogenic problem yesterday because LV and RV function looked ok and valves functioning well.   Continue to encourage nutrition and IS.    LOS: 33 days    Alleen BorneBryan K Buna Cuppett 01/28/2016

## 2016-01-28 NOTE — Progress Notes (Signed)
PCCM PROGRESS NOTE  ADMISSION DATE: 12/26/2015 CONSULT DATE: 12/27/2015 REFERRING PROVIDER: [facility]  CC: Short of breath   CULTURES: 2/19 Respiratory viral panel >> negative 2/19 Sputum >> oral flora 2/19 Pneumococcal ag >> negative 3/06 Blood >> NTD  ANTIBIOTICS: 2/18 Zithromax >> 2/21 2/18 Rocephin >> 2/24 3/06 Imipenem >>3/12 3/06 Vancomycin >>3/12 3/17 fluconazole>>> .............. 3/22 cefepime 3/22 vanc >>  LINES/TUBES: 2/19 ETT >> 2/19 (self extubated) 3/02 ETT >> 3/03 3/02 Rt radial a line >>3/04 3/02 Rt IJ introducer >>3/04 3/06 ETT >> 3/07 3/06 Lt [place] CVL 3/06 >> 3/06 Lt femoral CVL >> 3/08 3/20 bilateral pleurex catheters  STUDIES: and EVENTS: 2/18 Admit, cardiology consulted 2/18 CT chest >> emphysema, consolidation RLL 2/19 To ICU, VDRF 2/21 Rt/Lt heart cath, TCTS consulted 2/21 Rt/Lt heart cath, TCTS consulted 2/20 Echo >> EF 35 to 40%, severe AS, 42 mm aortic root, mild MR 2/26 - PAF 2/27 PFT >> FEV1 1.26 (37%), FEV1% 34, TLC 6.17 (90%), DLCO 26%, no BD 3/02 CABG, AVR, replacement of ascending thoracic aortic aneurysm 3/03 Transfuse PRBC 3/06 resp failure, lactic acidosis, ARF, shock liver, ETT 3/06 Echo >> normal LV/RV systolic fx 3/06 Abd u/s >> GB wall thickening w/o gallstones 3/06 CT abd/pelvis >> GB sludge, non obstructive renal stones b/l, diverticulosis 3/06 CT chest >> b/l effusions, centrilobular and paraseptal emphysema, compressive ATX 3/08 Off pressors 3/11 Rt thoracentesis >> 1400 ml fluid 3/14 remains very weak. 3/17 better 3/20 to OR for bilateral pleurex catheter placement. 3/22 o a-fib with RVR and subsequent hypotension.  Neo was started. Rx low dose amio drip without a bolus and check a limited echo.  Will check CVP and neo for BP support.  Will not cardiovert.  On exam, patient is lethargic but stable.  Patient is also hyperkalemic. Rx vanc and cefepime   SUBJECTIVE/OVERNIGHT/INTERVAL HX 01/28/16 - PCT > 5 and suggests ssepsis.  Worsening AKI  Yesterday 1.5 -> better now to 1.3. ECHO - ef 45%, septal parasdox +, RV mild dilatation. Small peridcardial effusion only. This AM - he is off pressors. Feels a lot better. Says he is off o2   VITAL SIGNS: BP 100/66 mmHg  Pulse 95  Temp(Src) 97.5 F (36.4 C) (Oral)  Resp 17  Ht  (1.753 m)  Wt 68 kg (149 lb 14.6 oz)  BMI 22.13 kg/m2  SpO2 95%  INTAKE/OUTPUT: I/O last 3 completed shifts: In: 2841.1 [P.O.:1200; I.V.:341.1; IV Piggyback:1300] Out: 2575 [Urine:1750; Chest Tube:825]  EXAM:   General: sitting up in chair, comfortable LOOKS MUCH BETTER than 01/22/16 HEENT: no stridor, [place]/AT, PERRL, EOM-I and MMM. Cardiac: RRR, 2/6 SM Chest: Bibasilar crackles.  Bilateral pleurex catheter. No distress Abd: soft, mildly tender, ND and +BS Ext: no edema and -tenderness. Neuro: alert, follows commands. Improved strength Skin: no rashes.  PULMONARY  Recent Labs Lab 01/21/16 0849 01/27/16 1705 01/28/16 0622  TCO2 41  --   --   O2SAT  --  50.5 53.2   CBC  Recent Labs Lab 01/26/16 0455 01/27/16 1707 01/28/16 0510  HGB 11.3* 8.8* 9.1*  HCT 37.6* 28.2* 29.2*  WBC 8.8 14.5* 13.1*  PLT 183 153 126*   COAGULATION  Recent Labs Lab 01/24/16 0724 01/25/16 0411 01/26/16 0455 01/27/16 0250 01/28/16 0510  INR 1.51* 1.80* 1.83* 2.00* 2.15*   CARDIAC  No results for input(s): TROPONINI in the last 168 hours. No results for input(s): PROBNP in the last 168 hours.  CHEMISTRY  Recent Labs Lab 01/22/16 [phone] 01/23/16 0102  01/24/16 0724 01/25/16 0411 01/25/16 2010 01/26/16 0455 01/27/16 0250 01/27/16 1707 01/28/16 0510  NA 138 141 137 139 136 137 130* 130* 134*  K 5.2* 6.5* 4.7 5.5* 5.9* 5.1 5.7* 4.8 4.0  CL 91* 91* 89* 90* 92* 90* 89* 89* 91*  CO2 36* 38* 37* 39* 29 37* 35* 33* 35*  GLUCOSE 132* 98 113* 128* 112* 108* 171* 162* 114*  BUN 49* 46* 41* 38* 37* 35* 35* 38* 34*  CREATININE 1.26* 1.32* 1.29* 1.29* 1.18 1.32* 1.59* 1.56* 1.35*  CALCIUM  8.4* 8.9 8.6* 8.6* 8.4* 8.2* 8.2* 8.1* 7.9*  MG 1.9 2.0 1.8 1.8  --  1.8  --   --   --   PHOS 3.1 3.1 4.2 4.0  --  4.3  --   --   --    Estimated Creatinine Clearance: 53.2 mL/min (by C-G formula based on Cr of 1.35).  LIVER  Recent Labs Lab 01/22/16 0513  01/24/16 0724 01/25/16 0411 01/26/16 0455 01/27/16 0250 01/28/16 0510  AST 67*  --   --  77*  --   --  53*  ALT 145*  --   --  130*  --   --  80*  ALKPHOS 94  --   --  101  --   --  69  BILITOT 1.4*  --   --  1.2  --   --  1.4*  PROT 4.8*  --   --  5.2*  --   --  4.7*  ALBUMIN 2.2*  --   --  2.2*  --   --  2.1*  INR 2.06*  < > 1.51* 1.80* 1.83* 2.00* 2.15*  < > = values in this interval not displayed.   INFECTIOUS  Recent Labs Lab 01/27/16 1311 01/28/16 0510  LATICACIDVEN 2.2*  --   PROCALCITON 4.24 5.74     ENDOCRINE CBG (last 3)  No results for input(s): GLUCAP in the last 72 hours.       IMAGING x48h  - image(s) personally visualized  -   highlighted in bold Dg Chest Port 1 View  01/27/2016  ADDENDUM REPORT: 01/27/2016 17:00 ADDENDUM: The PICC line should be withdrawn approximately 2 cm to lie at the cavoatrial junction. Electronically Signed   By: Elsie StainJohn T Curnes M.D.   On: 01/27/2016 17:00  01/27/2016  CLINICAL DATA:  PICC line insertion. EXAM: PORTABLE CHEST 1 VIEW COMPARISON:  None. FINDINGS: The heart is enlarged. There is a moderate-sized RIGHT pleural effusion. BILATERAL PleurX catheters have been inserted. PICC line insertion from RIGHT arm approach lies with its tip in the proximal RIGHT atrium. The heart is enlarged. Previous median sternotomy for aortic valve replacement. Subsegmental atelectasis at both bases, greater on the RIGHT. IMPRESSION: PICC line tip from RIGHT arm approach proximal RIGHT atrium. No pneumothorax. Electronically Signed: By: Elsie StainJohn T Curnes M.D. On: 01/27/2016 16:42    DISCUSSION: 65 yo male former smoker with acute on chronic respiratory failure r/t AECOPD +/- PNA and bilateral  pleural effusions c/b NSTEMI, severe AS. S/p bilateral pleur-x 3/21. Course c/b AFib/flutter and hyperkalemia. Probably got a bit septic NOS 01/27/16 and improved 01/28/16 with antibiotics. Physicial conditioning also improved 01/28/16 since 01/22/16   ASSESSMENT/PLAN:  PULMONARY A: Acute on chronic hypoxic/hypercapnic respiratory failure >> much improved after Rt thoracentesis 3/11. COPD/emphysema with exacerbation. B/l pleural effusions. Recurring pleural effusion.   - on pleurx. Some R > L effusion persists  P: Oxygen to keep SpO2 90 to 95%. Continue brovana,  pulmicort, spiriva PRN albuterol Mobilize, pulm hygiene  Drain pleur-x daily   CARDIOLOGY A: NSTEMI. Severe AS and severe CAD s/p CABG and AVR 3/02. Acute on chronic systolic/diastolic CHF. HLD. Afib RVR  Hypotension    - recurrent sepsis 01/27/16 based on PCT and off pressors 01/28/16 with antibiotics. Not on amio gtt  P:  Continue digoxin, scheduled lopressor per CVTS/ cards Continue coumadin per pharmacy  INR per pharm  RENAL A: AKI from sepsis 01/27/16 - improved Hyperkalemia.01/27/16 - resolved  P: Monitor renal fx, urine outpt, electrolytes Replace electrolytes as indicated Hold further diuresis for now  GASTROENTEROLOGY A: Nutrition. Shock liver >> LFTs trending down. P: Started regular diet and feeling better.  HEMATOLOGY A: Anemia of critical illness. Thrombocytopenia. P: F/u CBC  INFECTION A: CAP >> completed Abx 2/24. Septic shock 3/06 >> possible sources are line vs respiratory vs gallbladder >> improving.   - PCT profile c/w sepsis. Off pressors P: Monitor wbc, fever curve  Cont vanc/cefepime   ENDOCRINE A: Hyperglycemia. P: SSI  NEUROLOGY A: Post-op pain control. Deconditioning.   - normal mental status 01/28/16 and improving conditioning  P: Prn fentanyl Mobilize as able PT/OT     pccm will follow   Dr. Kalman Shan, M.D., [facility].C.P Pulmonary and Critical  Care Medicine Staff Physician [facility] Pager: [phone], If no answer or between  15:00h - 7:00h: call [phone]  01/28/2016 7:36 AM

## 2016-01-28 NOTE — Care Management Note (Addendum)
Case Management Note  Patient Details  Name: Craig [name] MRN: [id number] Date of Birth: 03/21/1951  Subjective/Objective:           Patient Details  Name: Craig [name] MRN: [id number] Date of Birth: 03/05/1951  Subjective/Objective:        Pt admitted with Acute Respiratory Failure    Action/Plan:   01/28/2016  Pt required pressors overnight for low BP.  CM spoke with PT post am evaluation; pt is very slow to progress.  Per PT; Recommendation continues to be CIR with SNF back up plan based on current level of activity.  CM will continue to monitor for disposition needs.  01/27/16 Pt is s/p Bilateral Pluerx Catheters - at this time it is undetermined if pt will discharge with catheters; CM will continue to follow for discharge needs.  CIR continues to follow pt as well as CSW .  Jari FavreJasmine Woodward [facility] Financial Counselor -  FC will come to pts room tomorrow tentatively 10am to facilitate Medicaid application, [name] also arranged for disability consideration via the service center.  Sister made aware.  CM received call from family member; family stated pts requesting discharge to sisters home with 24 hour care provided by sister if not accepted into CIR.  CM went back to speak with pt; pt now concurs that he would prefer to discharge to sisters care if not accepted into CIR.  CM spoke with PT prior to tentative therapy session;  Per last completed session of PT evaluation of pt; at this point recommendation would be for SNF if unable to discharge to CIR,   PT will continue to follow as discharge needs may change as pt progresses,  Pt declined PT evaluation for today.  CM contacted financial consuelor [name] [phone] to inquire if pt has been reassessed for medicaid eligibility post surgery - left voicemail.  CM spoke with pt regarding recommended discharge plan CIR vs SNF; pt did not voice concerns to either option,  Correction to previous entry; son's name is [name] not  [name].  CM will continue to monitor  01/20/16 Pt still experiencing respiratory issues and remains in ICU - [facility] for 24 hours.  Recommendations have been placed for CIR.  CSW consulted for SNF backup plan.  CM will continue to monitor for disposition needs  01/08/16 Pt is s/p CABG  01/06/16 Pt is independent from home with son [name].  Pt is without insurance; will need PCP initiated at free clinic prior to discharge. CM will continue to monitor for disposition needs   Expected Discharge Date:                  Expected Discharge Plan:  Home/Self Care  In-House Referral:     Discharge planning Services  CM Consult  Post Acute Care Choice:    Choice offered to:     DME Arranged:    DME Agency:     HH Arranged:    HH Agency:     Status of Service:  In process, will continue to follow  Medicare Important Message Given:    Date Medicare IM Given:    Medicare IM give by:    Date Additional Medicare IM Given:    Additional Medicare Important Message give by:     If discussed at Long Length of Stay Meetings, dates discussed:  01/05/16, 01/07/16  Additional Comments:  01/06/16- 1145- Donn PieriniKristi Webster RN, BSN- plan for [employer]ABG/AVR tomorrow 3/2- CM will follow post op for d/c  needs  Cherylann Parr, RN 01/28/2016, 11:04 AM           Action/Plan:   Expected Discharge Date:                  Expected Discharge Plan:  Home/Self Care  In-House Referral:     Discharge planning Services  CM Consult  Post Acute Care Choice:    Choice offered to:     DME Arranged:    DME Agency:     HH Arranged:    HH Agency:     Status of Service:  In process, will continue to follow  Medicare Important Message Given:    Date Medicare IM Given:    Medicare IM give by:    Date Additional Medicare IM Given:    Additional Medicare Important Message give by:     If discussed at Long Length of Stay Meetings, dates discussed:    Additional Comments:  Cherylann Parr, RN 01/28/2016, 11:04  AM

## 2016-01-28 NOTE — Progress Notes (Signed)
Pharmacy Antibiotic Note  Craig FeinsteinRobert S Canipe is a 65 y.o. male admitted on 12/26/2015 with ACS > CABG x4 and AVR.  Pharmacy has been consulted for vancomycin and Cefepime dosing.   -WBC= 13.1, tmax= 100.5, SCr= 1.35 (trend down), CrCl ~ 50-55.   Plan: Change vancomycin to 500mg  IV q24hr Continue Cefepime 2gm q24  Height: 5\' 9"  (175.3 cm) Weight: 149 lb 14.6 oz (68 kg) IBW/kg (Calculated) : 70.7  Temp (24hrs), Avg:97.8 F (36.6 C), Min:97.5 F (36.4 C), Max:98 F (36.7 C)   Recent Labs Lab 01/22/16 0513  01/25/16 0411 01/25/16 2010 01/26/16 0455 01/27/16 0250 01/27/16 1311 01/27/16 1707 01/28/16 0510  WBC 13.9*  --  9.5  --  8.8  --   --  14.5* 13.1*  CREATININE 1.26*  < > 1.29* 1.18 1.32* 1.59*  --  1.56* 1.35*  LATICACIDVEN  --   --   --   --   --   --  2.2*  --   --   < > = values in this interval not displayed.  Estimated Creatinine Clearance: 53.2 mL/min (by C-G formula based on Cr of 1.35).    No Known Allergies  Recent Antimicrobials Vanc 3/22>> Cefepime 3/22>> Fluconazole 3/17>(3/24) Imipenem 3/6 >>3/13 Vanc 3/6 >>3/13   Microbiology results: 3/22 Ucx 3/22 BCx 3/15 UCx: yeast  3/12 pleural fluid NGF  Harland GermanAndrew Banesa Tristan, Pharm D 01/28/2016 11:35 AM

## 2016-01-28 NOTE — Progress Notes (Signed)
Patient ID: Craig [name], male   DOB: 08/06/1951, 65 y.o.   MRN: [id number] EVENING ROUNDS NOTE :     [address]       [place] [id number]             [phone]                 3 Days Post-Op Procedure(s) (LRB): INSERTION Bilateral PLEURAL DRAINAGE CATHETER (Bilateral)  Total Length of Stay:  LOS: 33 days  BP 102/59 mmHg  Pulse 136  Temp(Src) 97.4 F (36.3 C) (Oral)  Resp 26  Ht 5\' 9"  (1.753 m)  Wt 149 lb 14.6 oz (68 kg)  BMI 22.13 kg/m2  SpO2 100%  .Intake/Output      03/22 0701 - 03/23 0700 03/23 0701 - 03/24 0700   P.O. 1200 240   I.V. (mL/kg) 341.1 (5)    IV Piggyback 1300 100   Total Intake(mL/kg) 2841.1 (41.8) 340 (5)   Urine (mL/kg/hr) 1200 (0.7) 400 (0.5)   Stool 0 (0) 0 (0)   Chest Tube 825 (0.5) 950 (1.3)   Total Output 2025 1350   Net +816.1 -1010        Stool Occurrence 2 x 1 x         Lab Results  Component Value Date   WBC 13.1* 01/28/2016   HGB 9.1* 01/28/2016   HCT 29.2* 01/28/2016   PLT 126* 01/28/2016   GLUCOSE 114* 01/28/2016   CHOL 157 12/27/2015   TRIG 48 12/27/2015   HDL 30* 12/27/2015   LDLCALC 117* 12/27/2015   ALT 80* 01/28/2016   AST 53* 01/28/2016   NA 134* 01/28/2016   K 4.0 01/28/2016   CL 91* 01/28/2016   CREATININE 1.35* 01/28/2016   BUN 34* 01/28/2016   CO2 35* 01/28/2016   TSH 2.395 01/04/2016   INR 2.15* 01/28/2016   HGBA1C  05/28/2009    5.7 (NOTE) The ADA recommends the following therapeutic goal for glycemic control related to Hgb A1c measurement: Goal of therapy: <6.5 Hgb A1c  Reference: American Diabetes Association: Clinical Practice Recommendations 2010, Diabetes Care, 2010, 33: (Suppl  1).   About 500 ml from pleurix each side Stable day  Delight OvensEdward B Abbie Berling MD  Beeper [phone] Office [phone] 01/28/2016 6:02 PM

## 2016-01-28 NOTE — Progress Notes (Signed)
Pt has been NPO since 0930 waiting on GB US.  Called US x 2 today and US tech states, "will be there shortly".

## 2016-01-28 NOTE — Progress Notes (Signed)
PATIENT ID: [id number] with severe AS, COPD, hypertension, and CAD here with NSTEMI found to have severe AS and a dilated ascending thoracic aorta.  Now s/p CABG, AVR and aortic root replacement.  INTERVAL HISTORY: Post operatively Craig [name] had bilateral pleural  Effusions requiring thoracentesis. On 3/20 he had bilateral Pleurx catheters placed. Yesterday he had an episode of atrial fibrillation with rapid ventricular response and hypotension requiring phenylephrine.  He was started on an amiodarone infusion.   Limited echo revealed LVEF 45-50%. The study was technically inadequate for detection of wall motion abnormalities.   There is a small pericardial effusion without evidence of tamponade.  SUBJECTIVE:  Craig [name] is feeling well this morning. He denies any chest pain or shortness of breath. He also denies palpitations, lightheadedness, or dizziness.   PHYSICAL EXAM Filed Vitals:   01/28/16 0700 01/28/16 0735 01/28/16 0800 01/28/16 0815  BP: 100/68  96/68   Pulse: 88  91 94  Temp: 97.5 F (36.4 C)     TempSrc: Oral     Resp: Height:      Weight:      SpO2: 96% 100% 92% 88%   General:   Well-appearing.  NAD. Neck: No JVD Lungs:  CTAB Heart:  RRR.  No m/r/g.  Normal S1/S2.   Abdomen:  Soft, NT, ND.  +BS Extremities:  WWP.  Trace edema.   LABS: Lab Results  Component Value Date   TROPONINI 7.41* 01/11/2016   Results for orders placed or performed during the hospital encounter of 12/26/15 (from the past 24 hour(s))  Procalcitonin     Status: None   Collection Time: 01/27/16  1:11 PM  Result Value Ref Range   Procalcitonin 4.24 ng/mL  Lactic acid, plasma     Status: Abnormal   Collection Time: 01/27/16  1:11 PM  Result Value Ref Range   Lactic Acid, Venous 2.2 (HH) 0.5 - 2.0 mmol/L  Carboxyhemoglobin     Status: Abnormal   Collection Time: 01/27/16  5:05 PM  Result Value Ref Range   Total hemoglobin 9.0 (L) 13.5 - 18.0 g/dL   O2 Saturation 08.6 %   Carboxyhemoglobin 2.3 (H) 0.5 - 1.5 %   Methemoglobin 0.8 0.0 - 1.5 %  CBC     Status: Abnormal   Collection Time: 01/27/16  5:07 PM  Result Value Ref Range   WBC 14.5 (H) 4.0 - 10.5 K/uL   RBC 2.89 (L) 4.22 - 5.81 MIL/uL   Hemoglobin 8.8 (L) 13.0 - 17.0 g/dL   HCT 57.8 (L) 46.9 - 62.9 %   MCV 97.6 78.0 - 100.0 fL   MCH 30.4 26.0 - 34.0 pg   MCHC 31.2 30.0 - 36.0 g/dL   RDW 52.8 (H) 41.3 - 24.4 %   Platelets 153 150 - 400 K/uL  Basic metabolic panel     Status: Abnormal   Collection Time: 01/27/16  5:07 PM  Result Value Ref Range   Sodium 130 (L) 135 - 145 mmol/L   Potassium 4.8 3.5 - 5.1 mmol/L   Chloride 89 (L) 101 - 111 mmol/L   CO2 33 (H) 22 - 32 mmol/L   Glucose, Bld 162 (H) 65 - 99 mg/dL   BUN 38 (H) 6 - 20 mg/dL   Creatinine, Ser 0.10 (H) 0.61 - 1.24 mg/dL   Calcium 8.1 (L) 8.9 - 10.3 mg/dL   GFR calc non Af Amer 45 (L) >60 mL/min   GFR calc Af Amer 52 (  L) >60 mL/min   Anion gap 8 5 - 15  Protime-INR     Status: Abnormal   Collection Time: 01/28/16  5:10 AM  Result Value Ref Range   Prothrombin Time 23.9 (H) 11.6 - 15.2 seconds   INR 2.15 (H) 0.00 - 1.49  CBC     Status: Abnormal   Collection Time: 01/28/16  5:10 AM  Result Value Ref Range   WBC 13.1 (H) 4.0 - 10.5 K/uL   RBC 3.01 (L) 4.22 - 5.81 MIL/uL   Hemoglobin 9.1 (L) 13.0 - 17.0 g/dL   HCT 95.629.2 (L) 21.339.0 - 08.652.0 %   MCV 97.0 78.0 - 100.0 fL   MCH 30.2 26.0 - 34.0 pg   MCHC 31.2 30.0 - 36.0 g/dL   RDW 57.818.9 (H) 46.911.5 - 62.915.5 %   Platelets 126 (L) 150 - 400 K/uL  Comprehensive metabolic panel     Status: Abnormal   Collection Time: 01/28/16  5:10 AM  Result Value Ref Range   Sodium 134 (L) 135 - 145 mmol/L   Potassium 4.0 3.5 - 5.1 mmol/L   Chloride 91 (L) 101 - 111 mmol/L   CO2 35 (H) 22 - 32 mmol/L   Glucose, Bld 114 (H) 65 - 99 mg/dL   BUN 34 (H) 6 - 20 mg/dL   Creatinine, Ser 5.281.35 (H) 0.61 - 1.24 mg/dL   Calcium 7.9 (L) 8.9 - 10.3 mg/dL   Total Protein 4.7 (L) 6.5 - 8.1 g/dL   Albumin 2.1 (L) 3.5 -  5.0 g/dL   AST 53 (H) 15 - 41 U/L   ALT 80 (H) 17 - 63 U/L   Alkaline Phosphatase 69 38 - 126 U/L   Total Bilirubin 1.4 (H) 0.3 - 1.2 mg/dL   GFR calc non Af Amer 54 (L) >60 mL/min   GFR calc Af Amer >60 >60 mL/min   Anion gap 8 5 - 15  Digoxin level     Status: Abnormal   Collection Time: 01/28/16  5:10 AM  Result Value Ref Range   Digoxin Level 0.6 (L) 0.8 - 2.0 ng/mL  Procalcitonin     Status: None   Collection Time: 01/28/16  5:10 AM  Result Value Ref Range   Procalcitonin 5.74 ng/mL  Carboxyhemoglobin     Status: Abnormal   Collection Time: 01/28/16  6:22 AM  Result Value Ref Range   Total hemoglobin 9.4 (L) 13.5 - 18.0 g/dL   O2 Saturation 41.353.2 %   Carboxyhemoglobin 2.1 (H) 0.5 - 1.5 %   Methemoglobin 1.0 0.0 - 1.5 %    Intake/Output Summary (Last 24 hours) at 01/28/16 0848 Last data filed at 01/28/16 0600  Gross per 24 hour  Intake 2361.14 ml  Output   2025 ml  Net 336.14 ml    Telemetry: Atrial fibrillation.  PVCs occasionally.  ASSESSMENT AND PLAN:  Principal Problem:   Acute respiratory failure with hypoxia (HCC) Active Problems:   CAP (community acquired pneumonia)   NSTEMI (non-ST elevated myocardial infarction) (HCC)   COPD (chronic obstructive pulmonary disease) (HCC)   Hypoxia   Elevated troponin   History of hypertension   History of hyperlipidemia   Ascending aortic aneurysm (HCC)   Aortic stenosis   Acute respiratory failure (HCC)   CAD (coronary artery disease)   Coronary artery disease involving native coronary artery of native heart with unstable angina pectoris (HCC)   Severe aortic stenosis   Coronary artery disease involving native coronary artery of native heart without  angina pectoris   Acute renal failure (ARF) (HCC)   Acute respiratory acidosis   Pleural effusion   S/P CABG x 4   Sepsis (HCC)    # Paroxysmal atrial fibrillation with RVR: Craig [name] had recurrent atrial fibrillation with RVR.  Rates now well-controlled on  digoxin, metoprolol, and amiodarone.  Now off phenylephrine.  Warfarin for anticoagulation.  His INR is therapeutic today.  This patients CHA2DS2-VASc Score and unadjusted Ischemic Stroke Rate (% per year) is equal to 2.2 % stroke rate/year from a score of 2.    Above score calculated as 1 point each if present [CHF, HTN, DM, Vascular=MI/PAD/Aortic Plaque, Age if 65-74, or Male] Above score calculated as 2 points each if present [Age > 75, or Stroke/TIA/TE]  # Severe aortic stenosis: Underwent successful replacement of a bicuspid aortic valve with a 25 mm Edwards Magna-ease pericardial valve.  # NSTEMI: Craig [name] underwent successful CABG x4 (LIMA-->LAD, SVG-->OM, SVG to PDA, SVG--> PL).  Given that he presented with NSTEMI, consider starting Plavix when stable per CT Surgery.  Continue metoprolol. He is not on a statin given his transaminitis. He will need to be on a statin when this is resolved.  # Chronic systolic and diastolic heart failure:  LVEF improved to 45-50% post operatively.   Ischemic cardiomyopathy with LVEF 35-40%.  Continue metoprolol and start ACE-I/ARB when permitted by BP and renal function.   Time spent: 25 minutes-Greater than 50% of this time was spent in counseling, explanation of diagnosis, planning of further management, and coordination of care.    Dillion Stowers C. Duke Salvia, MD, [facility] 01/28/2016 8:48 AM

## 2016-01-28 NOTE — Progress Notes (Signed)
Physical Therapy Treatment Patient Details Name: Craig [name] MRN: [id number] DOB: Jul 22, 1951 Today's Date: 01/28/2016    History of Present Illness Patient is a 65 y/o male with hx of COPD, HTN, HLD, alcohol abuse and tobacco abuse presents with CAP, NSTEMI and intubated due to respiratory failure 2/19-2/20, s/p CABG x4 on 3/2 and s/p bilateral thoracentesis 3/10-11. Continues to have tenuous respiratory status secondary to severe COPD, bilateral pleural effusions and atelectasis.  Pt going to OR today 01/25/16 for bilateral pleurX catheter placement.    PT Comments    Patient progressing very slowly with mobility. Requires encouragement to increase distance and activity. Tolerated 2 bouts of ambulation with seated rest break. Continues to require assist for standing and ambulation. Fatigues easily. Able to maintain Sp02 >90% on 2L/min 02. Reports being limited due to pain in feet with mobility. BP improved with ambulation. Will continue to follow.   Follow Up Recommendations  CIR     Equipment Recommendations  Rolling walker with 5" wheels    Recommendations for Other Services OT consult     Precautions / Restrictions Precautions Precautions: Fall;Sternal Restrictions Weight Bearing Restrictions: Yes (sternal precautions)    Mobility  Bed Mobility Overal bed mobility: Needs Assistance Bed Mobility: Rolling;Sidelying to Sit Rolling: Min guard Sidelying to sit: Min guard       General bed mobility comments: Able to elevate trunk and bring LEs to EOB without assist. Holding heart pillow to maintain precautions.  Transfers Overall transfer level: Needs assistance Equipment used:  (w/c) Transfers: Sit to/from Stand Sit to Stand: Min assist;+2 physical assistance;+2 safety/equipment Stand pivot transfers: Min assist       General transfer comment: Assist to rise from EOB with cues for technique, holding heart pillow for sternal precautions. SPT bed to/from [facility]. Stood  from [employer], from [facility] x1, from w/c x1. Transferred to chair post ambulation bout.  Ambulation/Gait Ambulation/Gait assistance: Min assist;+2 safety/equipment Ambulation Distance (Feet): 24 Feet (+ 30') Assistive device:  (pushing w/c) Gait Pattern/deviations: Step-through pattern;Decreased stride length;Trunk flexed   Gait velocity interpretation: Below normal speed for age/gender General Gait Details: Slow, mildly unsteady gait with 2/4 DOE. Sp02 remained >90% on 2L/min 02. 1 seated rest break due to feet hurting. Needed encouragment for next bout of ambulation.   Stairs            Wheelchair Mobility    Modified Rankin (Stroke Patients Only)       Balance Overall balance assessment: Needs assistance Sitting-balance support: Feet supported;No upper extremity supported Sitting balance-Leahy Scale: Fair     Standing balance support: During functional activity;Bilateral upper extremity supported Standing balance-Leahy Scale: Poor Standing balance comment: Reliant on BUEs for support.                     Cognition Arousal/Alertness: Awake/alert Behavior During Therapy: WFL for tasks assessed/performed Overall Cognitive Status: Within Functional Limits for tasks assessed                      Exercises      General Comments        Pertinent Vitals/Pain Pain Assessment: Faces Faces Pain Scale: Hurts whole lot Pain Location: feet Pain Descriptors / Indicators: Tightness;Sore Pain Intervention(s): Monitored during session;Repositioned;Limited activity within patient's tolerance    Home Living                      Prior Function  PT Goals (current goals can now be found in the care plan section) Progress towards PT goals: Progressing toward goals (slowly)    Frequency  Min 3X/week    PT Plan Current plan remains appropriate    Co-evaluation             End of Session Equipment Utilized During Treatment: Gait  belt;Oxygen Activity Tolerance: Patient limited by fatigue;Patient limited by pain Patient left: in chair;with call bell/phone within reach;with nursing/sitter in room;with SCD's reapplied     Time: 1022-1100 PT Time Calculation (min) (ACUTE ONLY): 38 min  Charges:  $Gait Training: 23-37 mins $Therapeutic Activity: 8-22 mins                    G Codes:      Mehul Rudin A Bahar Shelden 01/28/2016, 11:17 AM  Mylo RedShauna Andreus Cure, PT, DPT [phone]

## 2016-01-29 LAB — BASIC METABOLIC PANEL
ANION GAP: 6 (ref 5–15)
BUN: 35 mg/dL — ABNORMAL HIGH (ref 6–20)
CALCIUM: 8 mg/dL — AB (ref 8.9–10.3)
CHLORIDE: 94 mmol/L — AB (ref 101–111)
CO2: 33 mmol/L — AB (ref 22–32)
CREATININE: 1.32 mg/dL — AB (ref 0.61–1.24)
GFR calc non Af Amer: 55 mL/min — ABNORMAL LOW (ref >60)
GLUCOSE: 99 mg/dL (ref 65–99)
Potassium: 3.8 mmol/L (ref 3.5–5.1)
Sodium: 133 mmol/L — ABNORMAL LOW (ref 135–145)

## 2016-01-29 LAB — URINALYSIS, ROUTINE W REFLEX MICROSCOPIC
Bilirubin Urine: NEGATIVE
Glucose, UA: NEGATIVE mg/dL
HGB URINE DIPSTICK: NEGATIVE
Ketones, ur: NEGATIVE mg/dL
LEUKOCYTES UA: NEGATIVE
NITRITE: NEGATIVE
PROTEIN: NEGATIVE mg/dL
SPECIFIC GRAVITY, URINE: 1.024 (ref 1.005–1.030)
pH: 5 (ref 5.0–8.0)

## 2016-01-29 LAB — PHOSPHORUS: Phosphorus: 3.7 mg/dL (ref 2.5–4.6)

## 2016-01-29 LAB — PROTIME-INR
INR: 1.62 — ABNORMAL HIGH (ref 0.00–1.49)
PROTHROMBIN TIME: 19.2 s — AB (ref 11.6–15.2)

## 2016-01-29 LAB — MAGNESIUM: MAGNESIUM: 1.8 mg/dL (ref 1.7–2.4)

## 2016-01-29 LAB — PROCALCITONIN: PROCALCITONIN: 4.32 ng/mL

## 2016-01-29 MED ORDER — WARFARIN SODIUM 2 MG PO TABS
2.0000 mg | ORAL_TABLET | Freq: Every day | ORAL | Status: DC
  Administered 2016-01-29: 2 mg via ORAL
  Filled 2016-01-29 (×2): qty 1

## 2016-01-29 NOTE — Progress Notes (Signed)
PCCM PROGRESS NOTE  ADMISSION DATE: 12/26/2015 CONSULT DATE: 12/27/2015 REFERRING PROVIDER: [facility]  CC: Short of breath  65 yo male former smoker with acute on chronic respiratory failure r/t AECOPD +/- PNA and bilateral pleural effusions c/b NSTEMI, severe AS. S/p bilateral pleur-x 3/21. Course c/b AFib/flutter and hyperkalemia. Probably got a bit septic NOS 01/27/16 and improved 01/28/16 with antibiotics.  CULTURES: 2/19 Respiratory viral panel >> negative 2/19 Sputum >> oral flora 2/19 Pneumococcal ag >> negative 3/06 Blood >> NTD  ANTIBIOTICS: 2/18 Zithromax >> 2/21 2/18 Rocephin >> 2/24 3/06 Imipenem >>3/12 3/06 Vancomycin >>3/12 3/17 fluconazole>>> .............. 3/22 cefepime 3/22 vanc >>  LINES/TUBES: 2/19 ETT >> 2/19 (self extubated) 3/02 ETT >> 3/03 3/02 Rt radial a line >>3/04 3/02 Rt IJ introducer >>3/04 3/06 ETT >> 3/07 3/06 Lt [place] CVL 3/06 >> 3/06 Lt femoral CVL >> 3/08 3/20 bilateral pleurex catheters  STUDIES: and EVENTS: 2/18 Admit, cardiology consulted 2/18 CT chest >> emphysema, consolidation RLL 2/19 To ICU, VDRF 2/21 Rt/Lt heart cath, TCTS consulted 2/21 Rt/Lt heart cath, TCTS consulted 2/20 Echo >> EF 35 to 40%, severe AS, 42 mm aortic root, mild MR 2/26 - PAF 2/27 PFT >> FEV1 1.26 (37%), FEV1% 34, TLC 6.17 (90%), DLCO 26%, no BD 3/02 CABG, AVR, replacement of ascending thoracic aortic aneurysm 3/03 Transfuse PRBC 3/06 resp failure, lactic acidosis, ARF, shock liver, ETT 3/06 Echo >> normal LV/RV systolic fx 3/06 Abd u/s >> GB wall thickening w/o gallstones 3/06 CT abd/pelvis >> GB sludge, non obstructive renal stones b/l, diverticulosis 3/06 CT chest >> b/l effusions, centrilobular and paraseptal emphysema, compressive ATX 3/08 Off pressors 3/11 Rt thoracentesis >> 1400 ml fluid 3/14 remains very weak. 3/17 better 3/20 to OR for bilateral pleurex catheter placement. 3/22 o a-fib with RVR and subsequent hypotension.  Neo was started. Rx low  dose amio drip without a bolus and check a limited echo.  Will check CVP and neo for BP support.  Will not cardiovert.  On exam, patient is lethargic but stable.  Patient is also hyperkalemic. Rx vanc and cefepime  01/28/16 - PCT > 5 and suggests ssepsis. Worsening AKI  Yesterday 1.5 -> better now to 1.3. ECHO - ef 45%, septal parasdox +, RV mild dilatation. Small peridcardial effusion only.   3/23 US abdomen >> trace pericholecystic fluid and gallbladder wall thickening which may reflect acalculous cholecystitis versus reactive   SUBJECTIVE/OVERNIGHT/INTERVAL HX  off pressors. Feels a lot better.  denies pain   VITAL SIGNS: BP 91/68 mmHg  Pulse 79  Temp(Src) 97.7 F (36.5 C) (Oral)  Resp 24  Ht  (1.753 m)  Wt 147 lb 0.8 oz (66.7 kg)  BMI 21.71 kg/m2  SpO2 91%  INTAKE/OUTPUT: I/O last 3 completed shifts: In: 1061.4 [P.O.:680; I.V.:181.4; IV Piggyback:200] Out: 2450 [Urine:1500; Chest Tube:950]  EXAM:   General: sitting up in bed, comfortable HEENT: no stridor, [place]/AT, PERRL, EOM-I and MMM. Cardiac: RRR, 2/6 SM Chest: Bibasilar crackles.  Bilateral pleurex catheter. No distress Abd: soft, mildly tender, ND and +BS Ext: no edema and -tenderness. Neuro: alert, follows commands. Improved strength Skin: no rashes.  PULMONARY  Recent Labs Lab 01/27/16 1705 01/28/16 0622  O2SAT 50.5 53.2   CBC  Recent Labs Lab 01/26/16 0455 01/27/16 1707 01/28/16 0510  HGB 11.3* 8.8* 9.1*  HCT 37.6* 28.2* 29.2*  WBC 8.8 14.5* 13.1*  PLT 183 153 126*   COAGULATION  Recent Labs Lab 01/25/16 0411 01/26/16 0455 01/27/16 0250 01/28/16 0510 01/29/16 0500  INR 1.80* 1.83* 2.00* 2.15* 1.62*   CARDIAC  No results for input(s): TROPONINI in the last 168 hours. No results for input(s): PROBNP in the last 168 hours.  CHEMISTRY  Recent Labs Lab 01/23/16 0438 01/24/16 0724 01/25/16 0411  01/26/16 0455 01/27/16 0250 01/27/16 1707 01/28/16 0510 01/29/16 0500  NA 141 137  139  < > 137 130* 130* 134* 133*  K 6.5* 4.7 5.5*  < > 5.1 5.7* 4.8 4.0 3.8  CL 91* 89* 90*  < > 90* 89* 89* 91* 94*  CO2 38* 37* 39*  < > 37* 35* 33* 35* 33*  GLUCOSE 98 113* 128*  < > 108* 171* 162* 114* 99  BUN 46* 41* 38*  < > 35* 35* 38* 34* 35*  CREATININE 1.32* 1.29* 1.29*  < > 1.32* 1.59* 1.56* 1.35* 1.32*  CALCIUM 8.9 8.6* 8.6*  < > 8.2* 8.2* 8.1* 7.9* 8.0*  MG 2.0 1.8 1.8  --  1.8  --   --   --  1.8  PHOS 3.1 4.2 4.0  --  4.3  --   --   --  3.7  < > = values in this interval not displayed. Estimated Creatinine Clearance: 53.3 mL/min (by C-G formula based on Cr of 1.32).  LIVER  Recent Labs Lab 01/25/16 0411 01/26/16 0455 01/27/16 0250 01/28/16 0510 01/29/16 0500  AST 77*  --   --  53*  --   ALT 130*  --   --  80*  --   ALKPHOS 101  --   --  69  --   BILITOT 1.2  --   --  1.4*  --   PROT 5.2*  --   --  4.7*  --   ALBUMIN 2.2*  --   --  2.1*  --   INR 1.80* 1.83* 2.00* 2.15* 1.62*     INFECTIOUS  Recent Labs Lab 01/27/16 1311 01/28/16 0510 01/29/16 0500  LATICACIDVEN 2.2*  --   --   PROCALCITON 4.24 5.74 4.32     ENDOCRINE CBG (last 3)  No results for input(s): GLUCAP in the last 72 hours.       IMAGING x48h  - image(s) personally visualized  -   highlighted in bold Dg Chest Port 1 View  01/28/2016  CLINICAL DATA:  Shortness breath.  Pleural effusion. EXAM: PORTABLE CHEST - 1 VIEW COMPARISON:  One-view chest x-ray 01/27/2016. FINDINGS: The heart is mildly enlarged. Bilateral pleural effusions are again noted. The right-sided PICC line is stable. Right greater than left lower lobe airspace disease is noted as well. There is no significant interval change. Remote bilateral rib fractures are present. IMPRESSION: 1. Stable appearance of bilateral pleural effusions and lower lobe airspace disease, right greater than left. 2. Bilateral chest tubes remain in place. 3. Stable right-sided PICC line. Electronically Signed   By: Marin [name] M.D.   On:  01/28/2016 07:41   Dg Chest Port 1 View  01/27/2016  ADDENDUM REPORT: 01/27/2016 17:00 ADDENDUM: The PICC line should be withdrawn approximately 2 cm to lie at the cavoatrial junction. Electronically Signed   By: Elsie Stain M.D.   On: 01/27/2016 17:00  01/27/2016  CLINICAL DATA:  PICC line insertion. EXAM: PORTABLE CHEST 1 VIEW COMPARISON:  None. FINDINGS: The heart is enlarged. There is a moderate-sized RIGHT pleural effusion. BILATERAL PleurX catheters have been inserted. PICC line insertion from RIGHT arm approach lies with its tip in the proximal RIGHT atrium. The heart is  enlarged. Previous median sternotomy for aortic valve replacement. Subsegmental atelectasis at both bases, greater on the RIGHT. IMPRESSION: PICC line tip from RIGHT arm approach proximal RIGHT atrium. No pneumothorax. Electronically Signed: By: Elsie StainJohn T Curnes M.D. On: 01/27/2016 16:42   [place] Abdomen Limited Ruq  01/28/2016  CLINICAL DATA:  Sepsis EXAM: US ABDOMEN LIMITED - RIGHT UPPER QUADRANT COMPARISON:  None. FINDINGS: Gallbladder: No cholelithiasis. Gallbladder sludge. Trace pericholecystic fluid. Gallbladder wall thickening measuring 5.5 mm. Negative sonographic Murphy sign. Common bile duct: Diameter: 3.5 mm Liver: No focal lesion identified. Within normal limits in parenchymal echogenicity. Other: Small amount ascites around the liver and in the right lower quadrant. IMPRESSION: 1. Gallbladder sludge with trace pericholecystic fluid and gallbladder wall thickening which may reflect acalculous cholecystitis versus reactive changes secondary to the presence of ascites. 2. Small amount of ascites. Electronically Signed   By: Elige KoHetal  Patel   On: 01/28/2016 19:34       ASSESSMENT/PLAN:  PULMONARY A: Acute on chronic hypoxic/hypercapnic respiratory failure >> much improved after Rt thoracentesis 3/11. COPD/emphysema with exacerbation. B/l pleural effusions. Recurring pleural effusion.   - on pleurx. Some R > L effusion  persists  P: Oxygen to keep SpO2 90 to 95%. Continue brovana, pulmicort, spiriva PRN albuterol Mobilize, pulm hygiene  Drain pleur-x daily   CARDIOLOGY A: NSTEMI. Severe AS and severe CAD s/p CABG and AVR 3/02. Acute on chronic systolic/diastolic CHF. HLD. Afib RVR  Hypotension    - recurrent sepsis 01/27/16 based on PCT and off pressors 01/28/16 with antibiotics.   P:  Continue digoxin, scheduled lopressor per CVTS/ cards Continue coumadin per pharmacy  INR per pharm  RENAL A: AKI from sepsis 01/27/16 - improved Hyperkalemia.01/27/16 - resolved  P: Monitor renal fx, urine outpt, electrolytes Replace electrolytes as indicated Hold further diuresis for now given rising cr  GASTROENTEROLOGY A: Protein calorie malnutrition. Shock liver >> LFTs trending down. Concern for acalculous chole P:  regular diet   HEMATOLOGY A: Anemia of critical illness. Thrombocytopenia. P: F/u CBC  INFECTION A: CAP >> completed Abx 2/24. Septic shock 3/06 >> possible sources are line vs respiratory vs gallbladder >> improving.   - PCT profile c/w sepsis. Off pressors P: Monitor wbc, fever curve  Cont cefepime x7ds empiric, can stop vanc soon  ENDOCRINE A: Hyperglycemia. P: SSI  NEUROLOGY A: Post-op pain control. Deconditioning.   P: Prn fentanyl Mobilize as able PT/OT   Summary -Sepsis syndrome post CABG/AVR with marginal lung function pre-op, unclear source - acalculous chole possible  pccm will follow again on Monday, d/w Dr Laneta Simmersbartle  Cyril Mourningakesh Zadok Holaway MD. [facility] CenterFCCP.  Pulmonary & Critical care Pager [phone] If no response call [phone]    01/29/2016 12:50 PM

## 2016-01-29 NOTE — Progress Notes (Signed)
Physical Therapy Treatment Patient Details Name: Craig [name] MRN: [id number] DOB: 1950/12/26 Today's Date: 01/29/2016    History of Present Illness Patient is a 65 y/o male with hx of COPD, HTN, HLD, alcohol abuse and tobacco abuse presents with CAP, NSTEMI and intubated due to respiratory failure 2/19-2/20, s/p CABG x4 on 3/2 and s/p bilateral thoracentesis 3/10-11. Continues to have tenuous respiratory status secondary to severe COPD, bilateral pleural effusions and atelectasis.  Pt going to OR today 01/25/16 for bilateral pleurX catheter placement.    PT Comments    Patient reports feeling well today. Eager to walk. Improved distance to 27' however dyspnea and dizziness reported after second ambulation bout. Sp02 dropped to 76% on 1L/min 02. Requires encouragement to progress activity. Continues to require Min A of 2 to stand and Mod A of 2 when fatigued or tired. Motivated to return home. Will follow.   Follow Up Recommendations  CIR     Equipment Recommendations  Rolling walker with 5" wheels    Recommendations for Other Services       Precautions / Restrictions Precautions Precautions: Fall;Sternal Restrictions Weight Bearing Restrictions: Yes Other Position/Activity Restrictions: sternal precautions    Mobility  Bed Mobility Overal bed mobility: Needs Assistance Bed Mobility: Rolling;Sidelying to Sit Rolling: Min guard Sidelying to sit: Min guard;HOB elevated       General bed mobility comments: Able to get to EOB without assist, holding heart pillow to maintain sternal precautions. Increased time.   Transfers Overall transfer level: Needs assistance Equipment used:  (w/c) Transfers: Sit to/from Stand Sit to Stand: Min assist;Mod assist;+2 physical assistance         General transfer comment: Assist to rise from EOB with cues for technique, holding heart pillow for sternal precautions. Stood from [employer], from w/c x1. Transferred to chair post ambulation  bout. Min A initially to stand progressing to Mod A when fatigued and tired.   Ambulation/Gait Ambulation/Gait assistance: Min assist;+2 safety/equipment Ambulation Distance (Feet): 60 Feet (+ 30') Assistive device:  (pushing w/c) Gait Pattern/deviations: Step-through pattern;Decreased stride length;Trunk flexed   Gait velocity interpretation: Below normal speed for age/gender General Gait Details: Slow, steady gait with 3/4 DOE. Sp02 dropped to 76% on 1L/min 02. 1 seated rest break. Reports dizziness/blurry vision after second ambulation bout. BP 135/90.   Stairs            Wheelchair Mobility    Modified Rankin (Stroke Patients Only)       Balance Overall balance assessment: Needs assistance Sitting-balance support: Feet supported;No upper extremity supported Sitting balance-Leahy Scale: Fair     Standing balance support: During functional activity;Bilateral upper extremity supported Standing balance-Leahy Scale: Poor Standing balance comment: Reliant on BUEs for support.                     Cognition Arousal/Alertness: Awake/alert Behavior During Therapy: WFL for tasks assessed/performed Overall Cognitive Status: Within Functional Limits for tasks assessed                      Exercises      General Comments        Pertinent Vitals/Pain Pain Assessment: No/denies pain    Home Living                      Prior Function            PT Goals (current goals can now be found in the care plan  section) Progress towards PT goals: Progressing toward goals (with encouragement)    Frequency  Min 3X/week    PT Plan Current plan remains appropriate    Co-evaluation             End of Session Equipment Utilized During Treatment: Gait belt;Oxygen Activity Tolerance: Patient limited by fatigue Patient left: in bed;with call bell/phone within reach     Time: 1034-1102 PT Time Calculation (min) (ACUTE ONLY): 28 min  Charges:   $Gait Training: 23-37 mins                    G Codes:      Chonte Ricke A Khristian Phillippi 01/29/2016, 12:12 PM Mylo RedShauna Cherita Hebel, PT, DPT [phone]

## 2016-01-29 NOTE — Progress Notes (Signed)
4 Days Post-Op Procedure(s) (LRB): INSERTION Bilateral PLEURAL DRAINAGE CATHETER (Bilateral) Subjective:  No complaints. Says he feels well and anxious to walk today. Hungry.  Objective: Vital signs in last 24 hours: Temp:  [97.4 F (36.3 C)-98 F (36.7 C)] 97.7 F (36.5 C) (03/24 0740) Pulse Rate:  [66-136] 79 (03/24 0700) Cardiac Rhythm:  [-] Atrial fibrillation (03/24 0400) Resp:  [10-27] 24 (03/24 0700) BP: (84-112)/(57-77) 91/68 mmHg (03/24 0700) SpO2:  [93 %-100 %] 98 % (03/24 0700) Weight:  [66.7 kg (147 lb 0.8 oz)] 66.7 kg (147 lb 0.8 oz) (03/24 0438)  Hemodynamic parameters for last 24 hours: CVP:  [4 mmHg-7 mmHg] 7 mmHg  Intake/Output from previous day: 03/23 0701 - 03/24 0700 In: 960 [P.O.:680; I.V.:130; IV Piggyback:150] Out: 1800 [Urine:850; Chest Tube:950] Intake/Output this shift:    General appearance: alert and cooperative Neurologic: intact Heart: irregularly irregular rhythm and no murmur Lungs: diminished breath sounds bibasilar Abdomen: soft, non-tender; bowel sounds normal; no masses,  no organomegaly Extremities: edema mild edema in legs Wound: incisions healing well  Lab Results:  Recent Labs  01/27/16 1707 01/28/16 0510  WBC 14.5* 13.1*  HGB 8.8* 9.1*  HCT 28.2* 29.2*  PLT 153 126*   BMET:  Recent Labs  01/28/16 0510 01/29/16 0500  NA 134* 133*  K 4.0 3.8  CL 91* 94*  CO2 35* 33*  GLUCOSE 114* 99  BUN 34* 35*  CREATININE 1.35* 1.32*  CALCIUM 7.9* 8.0*    PT/INR:  Recent Labs  01/29/16 0500  LABPROT 19.2*  INR 1.62*   ABG    Component Value Date/Time   PHART 7.366 01/12/2016 1529   HCO3 33.4* 01/12/2016 1529   TCO2 41 01/21/2016 0849   ACIDBASEDEF 3.0* 01/11/2016 0728   O2SAT 53.2 01/28/2016 0622   CBG (last 3)  No results for input(s): GLUCAP in the last 72 hours.  Assessment/Plan: S/P Procedure(s) (LRB): INSERTION Bilateral PLEURAL DRAINAGE CATHETER (Bilateral)  He is hemodynamically stable with low normal  BP off pressors.  Rhythm is atrial fib 80's on digoxin and low dose lopressor. He is probably not going to tolerate lopressor with his low normal BP. Pulmonary status stable. Continue draining PleurX catheters daily.  Possible sepsis of undetermined origin: cultures negative. GB US shows some GB thickening and sludge with small amount of pericholecystic fluid but he has no abdominal signs or symptoms so I think this is probably not the source. Will continue antibiotics. Procalcitonin is decreasing.  Encourage nutrition, mobilization, IS.  Coumadin back to 2 mg daily.   LOS: 34 days    Alleen BorneBryan K Fleetwood Pierron 01/29/2016

## 2016-01-29 NOTE — Progress Notes (Signed)
Patient ID: Craig FeinsteinRobert S Bedel, male   DOB: 04/03/1951, 65 y.o.   MRN: [id number] SICU Evening Rounds:  Stable day today. Ambulated 60 ft.  Urine output ok

## 2016-01-30 LAB — PHOSPHORUS: PHOSPHORUS: 3.4 mg/dL (ref 2.5–4.6)

## 2016-01-30 LAB — CBC
HEMATOCRIT: 31.7 % — AB (ref 39.0–52.0)
HEMOGLOBIN: 10 g/dL — AB (ref 13.0–17.0)
MCH: 30.3 pg (ref 26.0–34.0)
MCHC: 31.5 g/dL (ref 30.0–36.0)
MCV: 96.1 fL (ref 78.0–100.0)
Platelets: 155 10*3/uL (ref 150–400)
RBC: 3.3 MIL/uL — AB (ref 4.22–5.81)
RDW: 17.9 % — ABNORMAL HIGH (ref 11.5–15.5)
WBC: 10.4 10*3/uL (ref 4.0–10.5)

## 2016-01-30 LAB — BASIC METABOLIC PANEL
ANION GAP: 7 (ref 5–15)
BUN: 31 mg/dL — ABNORMAL HIGH (ref 6–20)
CALCIUM: 7.9 mg/dL — AB (ref 8.9–10.3)
CHLORIDE: 94 mmol/L — AB (ref 101–111)
CO2: 32 mmol/L (ref 22–32)
Creatinine, Ser: 1.07 mg/dL (ref 0.61–1.24)
GFR calc non Af Amer: >60 mL/min (ref >60)
GLUCOSE: 105 mg/dL — AB (ref 65–99)
POTASSIUM: 4 mmol/L (ref 3.5–5.1)
Sodium: 133 mmol/L — ABNORMAL LOW (ref 135–145)

## 2016-01-30 LAB — MAGNESIUM: Magnesium: 1.9 mg/dL (ref 1.7–2.4)

## 2016-01-30 LAB — PROTIME-INR
INR: 1.47 (ref 0.00–1.49)
Prothrombin Time: 17.9 seconds — ABNORMAL HIGH (ref 11.6–15.2)

## 2016-01-30 LAB — URINE CULTURE: Culture: 5000

## 2016-01-30 MED ORDER — WARFARIN SODIUM 4 MG PO TABS
4.0000 mg | ORAL_TABLET | Freq: Every day | ORAL | Status: DC
  Administered 2016-01-30: 4 mg via ORAL
  Filled 2016-01-30 (×2): qty 1

## 2016-01-30 MED ORDER — DEXTROSE 5 % IV SOLN
2.0000 g | Freq: Two times a day (BID) | INTRAVENOUS | Status: DC
  Administered 2016-01-30 – 2016-01-31 (×2): 2 g via INTRAVENOUS
  Filled 2016-01-30 (×4): qty 2

## 2016-01-30 MED ORDER — VANCOMYCIN HCL IN DEXTROSE 750-5 MG/150ML-% IV SOLN
750.0000 mg | Freq: Two times a day (BID) | INTRAVENOUS | Status: DC
  Administered 2016-01-30 – 2016-01-31 (×2): 750 mg via INTRAVENOUS
  Filled 2016-01-30 (×3): qty 150

## 2016-01-30 NOTE — Progress Notes (Signed)
5 Days Post-Op Procedure(s) (LRB): INSERTION Bilateral PLEURAL DRAINAGE CATHETER (Bilateral) Subjective: No complaints  Objective: Vital signs in last 24 hours: Temp:  [97.3 F (36.3 C)-98.3 F (36.8 C)] 98.3 F (36.8 C) (03/25 1524) Pulse Rate:  [60-93] 83 (03/25 1600) Cardiac Rhythm:  [-] Atrial fibrillation (03/25 1600) Resp:  [11-30] 21 (03/25 1600) BP: (90-126)/(65-85) 111/68 mmHg (03/25 1600) SpO2:  [93 %-99 %] 98 % (03/25 1600) Weight:  [68 kg (149 lb 14.6 oz)] 68 kg (149 lb 14.6 oz) (03/25 0500)  Hemodynamic parameters for last 24 hours: CVP:  [5 mmHg] 5 mmHg  Intake/Output from previous day: 03/24 0701 - 03/25 0700 In: 1130 [P.O.:660; I.V.:220; IV Piggyback:250] Out: 2375 [Urine:1325; Chest Tube:1050] Intake/Output this shift: Total I/O In: 450 [P.O.:450] Out: 1300 [Urine:400; Chest Tube:900]  General appearance: alert and cooperative Heart: irregularly irregular rhythm Lungs: diminished breath sounds bibasilar Abdomen: soft, non-tender; bowel sounds normal; no masses,  no organomegaly Extremities: edema moderate in legs but improving Wound: well-healed  Lab Results:  Recent Labs  01/28/16 0510 01/30/16 0410  WBC 13.1* 10.4  HGB 9.1* 10.0*  HCT 29.2* 31.7*  PLT 126* 155   BMET:  Recent Labs  01/29/16 0500 01/30/16 0410  NA 133* 133*  K 3.8 4.0  CL 94* 94*  CO2 33* 32  GLUCOSE 99 105*  BUN 35* 31*  CREATININE 1.32* 1.07  CALCIUM 8.0* 7.9*    PT/INR:  Recent Labs  01/30/16 0410  LABPROT 17.9*  INR 1.47   ABG    Component Value Date/Time   PHART 7.366 01/12/2016 1529   HCO3 33.4* 01/12/2016 1529   TCO2 41 01/21/2016 0849   ACIDBASEDEF 3.0* 01/11/2016 0728   O2SAT 53.2 01/28/2016 0622   CBG (last 3)  No results for input(s): GLUCAP in the last 72 hours.  Assessment/Plan:  He remains hemodynamically stable in rate controlled atrial fib on lopressor and digoxin. INR decreasing probably because he is eating better. Will increase  dose to 4 mg.  Respiratory status stable. PleurX catheters draining 400/500 cc daily. Hopefully this will decrease as albumin increases and excess body fluid resolves.  Continue Maxipime and vanc for suspected sepsis of undetermined origin.   LOS: 35 days    Alleen BorneBryan K Bartle 01/30/2016

## 2016-01-30 NOTE — Progress Notes (Signed)
Pharmacy Antibiotic Note  Craig FeinsteinRobert S Maye is a 65 y.o. male admitted on 12/26/2015 with ACS > CABG x4 and AVR.  Pharmacy has been consulted for vancomycin and Cefepime dosing.  Noted plans for 7 days of cefepime and to stop vanc soon -WBC= 13.1, tmax= 100.5, SCr= 1.07 (trend down), CrCl ~ 70.   Plan: Change vancomycin to 750mg  IV q24hr; Consider d/c? Change Cefepime to 2gm q12  Height: 5\' 9"  (175.3 cm) Weight: 149 lb 14.6 oz (68 kg) IBW/kg (Calculated) : 70.7  Temp (24hrs), Avg:97.7 F (36.5 C), Min:97.3 F (36.3 C), Max:98.2 F (36.8 C)   Recent Labs Lab 01/25/16 0411  01/26/16 0455 01/27/16 0250 01/27/16 1311 01/27/16 1707 01/28/16 0510 01/29/16 0500 01/30/16 0410  WBC 9.5  --  8.8  --   --  14.5* 13.1*  --  10.4  CREATININE 1.29*  < > 1.32* 1.59*  --  1.56* 1.35* 1.32* 1.07  LATICACIDVEN  --   --   --   --  2.2*  --   --   --   --   < > = values in this interval not displayed.  Estimated Creatinine Clearance: 67.1 mL/min (by C-G formula based on Cr of 1.07).    No Known Allergies  Recent Antimicrobials Vanc 3/22>> Cefepime 3/22>> Fluconazole 3/17>3/24 Imipenem 3/6 >>3/13 Vanc 3/6 >>3/13  Microbiology results: 3/22 BC x2>> ngtd  3/24 urine>> insignif growth 3/15 UCx: yeast  3/12 pleural fluid NGF  Harland GermanAndrew Kira Hartl, Pharm D 01/30/2016 2:29 PM

## 2016-01-31 LAB — PROTIME-INR
INR: 1.64 — ABNORMAL HIGH (ref 0.00–1.49)
PROTHROMBIN TIME: 19.4 s — AB (ref 11.6–15.2)

## 2016-01-31 MED ORDER — METOPROLOL TARTRATE 12.5 MG HALF TABLET
12.5000 mg | ORAL_TABLET | Freq: Two times a day (BID) | ORAL | Status: DC
  Administered 2016-01-31 – 2016-02-03 (×5): 12.5 mg via ORAL
  Filled 2016-01-31 (×7): qty 1

## 2016-01-31 MED ORDER — SODIUM CHLORIDE 0.9% FLUSH
3.0000 mL | Freq: Two times a day (BID) | INTRAVENOUS | Status: DC
  Administered 2016-02-01: 3 mL via INTRAVENOUS

## 2016-01-31 MED ORDER — ACETAMINOPHEN 325 MG PO TABS: 650.0000 mg | ORAL_TABLET | Freq: Four times a day (QID) | ORAL | Status: DC | PRN

## 2016-01-31 MED ORDER — TRAMADOL HCL 50 MG PO TABS
50.0000 mg | ORAL_TABLET | ORAL | Status: DC | PRN
  Administered 2016-01-31: 100 mg via ORAL
  Administered 2016-01-31: 50 mg via ORAL
  Administered 2016-02-01: 100 mg via ORAL
  Filled 2016-01-31 (×2): qty 2

## 2016-01-31 MED ORDER — OXYCODONE HCL 5 MG PO TABS
5.0000 mg | ORAL_TABLET | ORAL | Status: DC | PRN
  Administered 2016-02-02 – 2016-02-03 (×3): 10 mg via ORAL
  Filled 2016-01-31: qty 2
  Filled 2016-01-31 (×2): qty 1
  Filled 2016-01-31: qty 2

## 2016-01-31 MED ORDER — SODIUM CHLORIDE 0.9 % IV SOLN: 250.0000 mL | INTRAVENOUS | Status: DC | PRN

## 2016-01-31 MED ORDER — ASPIRIN EC 81 MG PO TBEC
81.0000 mg | DELAYED_RELEASE_TABLET | Freq: Every day | ORAL | Status: DC
  Administered 2016-01-31 – 2016-02-03 (×4): 81 mg via ORAL
  Filled 2016-01-31 (×4): qty 1

## 2016-01-31 MED ORDER — MOVING RIGHT ALONG BOOK
Freq: Once | Status: DC
  Filled 2016-01-31: qty 1

## 2016-01-31 MED ORDER — ONDANSETRON HCL 4 MG/2ML IJ SOLN: 4.0000 mg | Freq: Four times a day (QID) | INTRAMUSCULAR | Status: DC | PRN

## 2016-01-31 MED ORDER — SODIUM CHLORIDE 0.9% FLUSH: 3.0000 mL | INTRAVENOUS | Status: DC | PRN

## 2016-01-31 MED ORDER — ONDANSETRON HCL 4 MG PO TABS: 4.0000 mg | ORAL_TABLET | Freq: Four times a day (QID) | ORAL | Status: DC | PRN

## 2016-01-31 NOTE — Progress Notes (Signed)
6 Days Post-Op Procedure(s) (LRB): INSERTION Bilateral PLEURAL DRAINAGE CATHETER (Bilateral) Subjective:  No complaints, feels well and ambulated 75 ft without stopping.  Objective: Vital signs in last 24 hours: Temp:  [97 F (36.1 C)-98.3 F (36.8 C)] 97.4 F (36.3 C) (03/26 0714) Pulse Rate:  [60-91] 82 (03/26 0900) Cardiac Rhythm:  [-] Atrial fibrillation (03/26 1000) Resp:  [12-30] 21 (03/26 1000) BP: (90-147)/(60-86) 116/80 mmHg (03/26 0900) SpO2:  [92 %-99 %] 94 % (03/26 1000) Weight:  [67.1 kg (147 lb 14.9 oz)] 67.1 kg (147 lb 14.9 oz) (03/26 0800)  Hemodynamic parameters for last 24 hours:    Intake/Output from previous day: 03/25 0701 - 03/26 0700 In: 850 [P.O.:600; IV Piggyback:250] Out: 2200 [Urine:1300; Chest Tube:900] Intake/Output this shift: Total I/O In: 150 [P.O.:150] Out: 100 [Urine:100]  General appearance: alert and cooperative Heart: irregularly irregular rhythm Lungs: clear to auscultation bilaterally Extremities: edema mild in legs but improving Wound: incisions healing well  Lab Results:  Recent Labs  01/30/16 0410  WBC 10.4  HGB 10.0*  HCT 31.7*  PLT 155   BMET:  Recent Labs  01/29/16 0500 01/30/16 0410  NA 133* 133*  K 3.8 4.0  CL 94* 94*  CO2 33* 32  GLUCOSE 99 105*  BUN 35* 31*  CREATININE 1.32* 1.07  CALCIUM 8.0* 7.9*    PT/INR:  Recent Labs  01/31/16 0400  LABPROT 19.4*  INR 1.64*   ABG    Component Value Date/Time   PHART 7.366 01/12/2016 1529   HCO3 33.4* 01/12/2016 1529   TCO2 41 01/21/2016 0849   ACIDBASEDEF 3.0* 01/11/2016 0728   O2SAT 53.2 01/28/2016 0622   CBG (last 3)  No results for input(s): GLUCAP in the last 72 hours.  Assessment/Plan:  He is doing well overall.  He is hemodynamically stable in rate-controlled atrial fibrillation on low dose lopressor and digoxin. Continue coumadin for atrial fibrillation. On Maxipime and vanc for suspected sepsis started on 3/22. BC, UC and sputum negative.  He remains afebrile with normal WBC ct. Will treat for 7 days and stop. Continue draining PleurX catheters. Nutrition and PT extremely important for his recovery. Will transfer to 2W stepdown. Check labs and CXR in am.    LOS: 36 days    Alleen BorneBryan K Kellis Topete 01/31/2016

## 2016-01-31 NOTE — Progress Notes (Signed)
Pt transferred to 2w20, receiving RN in room. Pt stable at time of transfer. All belongings transferred with pt.

## 2016-01-31 NOTE — Progress Notes (Signed)
Utilization review completed.  

## 2016-01-31 NOTE — Progress Notes (Signed)
Patient tx to 2W20 from 2S. Tele monitor placed. CCMD notified. Patient oriented to the unit. Patient oriented to using the phone. Patient oriented to room. Patient A&O X4. Patient SOB on 1L O2. Vitals WNL. Call bell within reach. Will continue to monitor.  Valinda HoarLexie Rodolfo Notaro RN

## 2016-02-01 ENCOUNTER — Inpatient Hospital Stay (HOSPITAL_COMMUNITY): Payer: Medicaid Other

## 2016-02-01 DIAGNOSIS — J9602 Acute respiratory failure with hypercapnia: Secondary | ICD-10-CM

## 2016-02-01 LAB — BASIC METABOLIC PANEL
Anion gap: 6 (ref 5–15)
BUN: 24 mg/dL — AB (ref 6–20)
CALCIUM: 8 mg/dL — AB (ref 8.9–10.3)
CO2: 30 mmol/L (ref 22–32)
CREATININE: 0.99 mg/dL (ref 0.61–1.24)
Chloride: 96 mmol/L — ABNORMAL LOW (ref 101–111)
GFR calc non Af Amer: >60 mL/min (ref >60)
Glucose, Bld: 95 mg/dL (ref 65–99)
Potassium: 4.7 mmol/L (ref 3.5–5.1)
SODIUM: 132 mmol/L — AB (ref 135–145)

## 2016-02-01 LAB — CULTURE, BLOOD (ROUTINE X 2): CULTURE: NO GROWTH

## 2016-02-01 LAB — CBC
HCT: 32 % — ABNORMAL LOW (ref 39.0–52.0)
Hemoglobin: 10 g/dL — ABNORMAL LOW (ref 13.0–17.0)
MCH: 29.9 pg (ref 26.0–34.0)
MCHC: 31.3 g/dL (ref 30.0–36.0)
MCV: 95.5 fL (ref 78.0–100.0)
Platelets: 135 10*3/uL — ABNORMAL LOW (ref 150–400)
RBC: 3.35 MIL/uL — ABNORMAL LOW (ref 4.22–5.81)
RDW: 17.9 % — AB (ref 11.5–15.5)
WBC: 11.2 10*3/uL — ABNORMAL HIGH (ref 4.0–10.5)

## 2016-02-01 LAB — PROTIME-INR
INR: 1.89 — AB (ref 0.00–1.49)
PROTHROMBIN TIME: 21.6 s — AB (ref 11.6–15.2)

## 2016-02-01 MED ORDER — DEXTROSE 5 % IV SOLN
2.0000 g | Freq: Two times a day (BID) | INTRAVENOUS | Status: AC
  Administered 2016-02-01 – 2016-02-02 (×3): 2 g via INTRAVENOUS
  Filled 2016-02-01 (×3): qty 2

## 2016-02-01 MED ORDER — DEXTROSE 5 % IV SOLN
2.0000 g | Freq: Two times a day (BID) | INTRAVENOUS | Status: DC
  Filled 2016-02-01: qty 2

## 2016-02-01 MED ORDER — WARFARIN SODIUM 2.5 MG PO TABS
2.5000 mg | ORAL_TABLET | Freq: Once | ORAL | Status: AC
  Administered 2016-02-01: 2.5 mg via ORAL
  Filled 2016-02-01: qty 1

## 2016-02-01 NOTE — Progress Notes (Addendum)
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 Continues to have tenuous respiratory status secondary to severe COPD, bilateral pleural effusions and atelectasis.  Pt going to OR today 01/25/16 for bilateral pleurX catheter placement.    PT Comments    Patient progressing slowly with mobility. Pt self limits ambulation distance and activity. "I know what I can and cannot do." Explained intensity of inpatient rehab. Instructed pt on how to use rollator brakes and seat. Requires encouragement to progress with mobility. Requires assist of 2 to stand from low recliner despite cues for technique, "I know I cannot stand without someone else." Encouraged ambulation later in evening with RN. Will continue to follow.   Follow Up Recommendations  CIR     Equipment Recommendations  Rolling walker with 5" wheels    Recommendations for Other Services       Precautions / Restrictions Precautions Precautions: Fall;Sternal Restrictions Other Position/Activity Restrictions: sternal precautions    Mobility  Bed Mobility Overal bed mobility: Needs Assistance Bed Mobility: Sit to Supine       Sit to supine: Min assist;HOB elevated   General bed mobility comments: Assist to bring LLE into bed, HOB elevated. CUes to adhere to sternal precautions.  Transfers Overall transfer level: Needs assistance Equipment used: 4-wheeled walker Transfers: Sit to/from Stand Sit to Stand: Mod assist;+2 physical assistance         General transfer comment: Not able to stand with assist of 1 from low recliner due to weakness in BLEs despite cues for momentum and technique. Pt already has  it in his mind he cannot do it without assist of 2. Able to stand with second person holding heart pillow. Stood from rollator seat. Cues not to use UEs.  Ambulation/Gait Ambulation/Gait assistance: Min guard Ambulation Distance (Feet): 50 Feet (x2 bouts) Assistive device: 4-wheeled walker Gait Pattern/deviations: Step-through pattern;Decreased stride length;Trunk flexed   Gait velocity interpretation: Below normal speed for age/gender General Gait Details: Slow, steady gait with 2/4 DOE. Sp02 90% on 3L/min 02. 1 seated rest break. Instructed pt how to use rollator brakes and seat. Self limited ambulation distance.   Stairs            Wheelchair Mobility    Modified Rankin (Stroke Patients Only)       Balance Overall balance assessment: Needs assistance Sitting-balance support: Feet supported;No upper extremity supported Sitting balance-Leahy Scale: Fair     Standing balance support: During functional activity Standing balance-Leahy Scale: Poor                      Cognition Arousal/Alertness: Awake/alert Behavior During Therapy: WFL for tasks assessed/performed Overall Cognitive Status: Within Functional Limits for tasks assessed                      Exercises General Exercises - Lower Extremity Hip Flexion/Marching: Both;15 reps;Seated ("I am going to be too worn out to walk.") Heel Raises: Both;10 reps;Seated    General Comments General comments (skin integrity, edema, etc.): Pt reports not being ready for rehab yet. Explained intensity of rehab to pt. Seems to want to be able to control session and self limits.  Pertinent Vitals/Pain Pain Assessment: Faces Faces Pain Scale: Hurts whole lot Pain Location: back after trying to stand Pain Descriptors / Indicators: Sore;Aching Pain Intervention(s): Monitored during session;Repositioned;Limited activity within patient's tolerance;Patient requesting pain meds-RN notified    Home Living                       Prior Function            PT Goals (current goals can now be found in the care plan section) Progress towards PT goals: Progressing toward goals (slowly)    Frequency  Min 3X/week    PT Plan Current plan remains appropriate    Co-evaluation             End of Session Equipment Utilized During Treatment: Gait belt;Oxygen Activity Tolerance: Patient limited by fatigue;Patient limited by pain Patient left: in bed;with call bell/phone within reach;with nursing/sitter in room     Time: 1610-9604 PT Time Calculation (min) (ACUTE ONLY): 35 min  Charges:  $Gait Training: 8-22 mins $Therapeutic Activity: 8-22 mins                    G Codes:      Craig [name] 02/01/2016, 2:49 PM Mylo Red, PT, DPT [phone]

## 2016-02-01 NOTE — Care Management Note (Signed)
Case Management Note Previous CM note initiated by Raynald BlendSamantha Claxton RN, CM  Patient Details  Name: Craig FeinsteinRobert S Shankar MRN: [id number] Date of Birth: 07/29/1951     Subjective/Objective:        Pt admitted with Acute Respiratory Failure    Action/Plan:   02/01/2016  Pt required pressors overnight for low BP.  CM spoke with PT post am evaluation; pt is very slow to progress.  Per PT; Recommendation continues to be CIR with SNF back up plan based on current level of activity.  CM will continue to monitor for disposition needs.  01/27/16 Pt is s/p Bilateral Pluerx Catheters - at this time it is undetermined if pt will discharge with catheters; CM will continue to follow for discharge needs.  CIR continues to follow pt as well as CSW .  Jari FavreJasmine Woodward [facility] Financial Counselor -  FC will come to pts room tomorrow tentatively 10am to facilitate Medicaid application, [name] also arranged for disability consideration via the service center.  Sister made aware.  CM received call from family member; family stated pts requesting discharge to sisters home with 24 hour care provided by sister if not accepted into CIR.  CM went back to speak with pt; pt now concurs that he would prefer to discharge to sisters care if not accepted into CIR.  CM spoke with PT prior to tentative therapy session;  Per last completed session of PT evaluation of pt; at this point recommendation would be for SNF if unable to discharge to CIR,   PT will continue to follow as discharge needs may change as pt progresses,  Pt declined PT evaluation for today.  CM contacted financial consuelor [name] [phone] to inquire if pt has been reassessed for medicaid eligibility post surgery - left voicemail.  CM spoke with pt regarding recommended discharge plan CIR vs SNF; pt did not voice concerns to either option,  Correction to previous entry; son's name is Craig [name] not [name].  CM will continue to monitor  01/20/16 Pt still  experiencing respiratory issues and remains in ICU - [place] for 24 hours.  Recommendations have been placed for CIR.  CSW consulted for SNF backup plan.  CM will continue to monitor for disposition needs  01/08/16 Pt is s/p CABG  01/06/16 Pt is independent from home with son Craig LeatherwoodJarrett.  Pt is without insurance; will need PCP initiated at [facility] clinic prior to discharge. CM will continue to monitor for disposition needs   Expected Discharge Date:                  Expected Discharge Plan:  IP Rehab Facility  In-House Referral:     Discharge planning Services  CM Consult  Post Acute Care Choice:  IP Rehab Choice offered to:  Patient  DME Arranged:    DME Agency:     HH Arranged:    HH Agency:     Status of Service:  In process, will continue to follow  Medicare Important Message Given:    Date Medicare IM Given:    Medicare IM give by:    Date Additional Medicare IM Given:    Additional Medicare Important Message give by:     If discussed at Long Length of Stay Meetings, dates discussed:  01/05/16, 01/07/16  Additional Comments:  02/01/16- 1500- Donn PieriniKristi Esaias Cleavenger RN, BSN- pt tx back to 2W from 2S- d/c planning for CIBritta Mccreedy- Barbara from CIR is following for potential admission to IP rehab- pt with pleurx  Cath drains- form on shadow chart for MD signature- CM to f/u for d/c needs  01/06/16- 1145- [name] RN, BSN- plan for CABG/AVR tomorrow 3/2- CM will follow post op for d/c needs  [name], RN 02/01/2016, 2:57 PM           Action/Plan:   Expected Discharge Date:                  Expected Discharge Plan:  IP Rehab Facility  In-House Referral:     Discharge planning Services  CM Consult  Post Acute Care Choice:  IP Rehab Choice offered to:  Patient  DME Arranged:    DME Agency:     HH Arranged:    HH Agency:     Status of Service:  In process, will continue to follow  Medicare Important Message Given:    Date Medicare IM Given:    Medicare IM give by:    Date  Additional Medicare IM Given:    Additional Medicare Important Message give by:     If discussed at Long Length of Stay Meetings, dates discussed:    Additional Comments:  [name], RN 02/01/2016, 2:57 PM Case Management Note  Patient Details  Name: [name] MRN: [id number] Date of Birth: 1951/04/04  Subjective/Objective:                    Action/Plan:   Expected Discharge Date:                  Expected Discharge Plan:  IP Rehab Facility  In-House Referral:     Discharge planning Services  CM Consult  Post Acute Care Choice:  IP Rehab Choice offered to:  Patient  DME Arranged:    DME Agency:     HH Arranged:    HH Agency:     Status of Service:  In process, will continue to follow  Medicare Important Message Given:    Date Medicare IM Given:    Medicare IM give by:    Date Additional Medicare IM Given:    Additional Medicare Important Message give by:     If discussed at Long Length of Stay Meetings, dates discussed:    Additional Comments:  [name], RN 02/01/2016, 2:57 PM

## 2016-02-01 NOTE — Progress Notes (Signed)
PCCM PROGRESS NOTE  ADMISSION DATE: 12/26/2015 CONSULT DATE: 12/27/2015 REFERRING PROVIDER: [facility]  CC: Short of breath  65 yo [name] former smoker with acute on chronic respiratory failure r/t AECOPD +/- PNA and bilateral pleural effusions c/b NSTEMI, severe AS. S/p bilateral pleur-x 3/21. Course c/b AFib/flutter and hyperkalemia. Probably got a bit septic NOS 01/27/16 and improved 01/28/16 with antibiotics.  MICROBIOLOGY: Urine Ctx 3/24:  Negative Blood Culture x1 PICC 3/22:  Negative  Urine Ctx 3/15:  Yeast  L Pleural Fluid Ctx 3/12:  Negative R Pleural Fluid Ctx 3/11:  Negative Blood Ctx x2 3/6:  Negative MRSA PCR 3/2:  Negative Tracheal Asp Ctx 2/19:  Oral [place] MRSA PCR 2/19:  Negative  Urine Strep Ag 2/19:  Negative  Respiratory Viral Panel PCR 2/19:  Negative  Influenza PCR 2/18:  Negative  ANTIBIOTICS: Cefepime 3/22>>> Vancomcyin 3/2; 3/6 - 3/13; 3/22 - 3/26 Fluconazole 3/17 - 3/23 Imipenem 3/6 - 3/10 Cefuroxime 3/2 - 3/4 Azithromycin 2/18 - 2/22 Rocephin 2/18 - 2/24  LINES/TUBES: 2/19 ETT >> 2/19 (self extubated) 3/02 ETT >> 3/03 3/02 Rt radial a line >>3/04 3/02 Rt IJ introducer >>3/04 3/06 ETT >> 3/07 3/06 Lt [place] CVL 3/06 >> 3/06 Lt femoral CVL >> 3/08 3/Craig bilateral pleurex catheters  SUBJECTIVE: Patient reports pleural catheters continuing to drain. Reports edema is improving as well. Denies any new chest pain or pressure. Reports dyspnea is improving. Denies any cough.  REVIEW OF SYSTEMS: No subjective fever, chills or sweats. No nausea or vomiting. Good appetite.  VITAL SIGNS: BP 124/73 mmHg  Pulse 80  Temp(Src) 97.2 F (36.2 C) (Oral)  Resp 18  Ht  (1.753 m)  Wt 66.996 kg (147 lb 11.2 oz)  BMI 21.80 kg/m2  SpO2 100%  INTAKE/OUTPUT: I/O last 3 completed shifts: In: 650 [P.O.:300; IV Piggyback:350] Out: 2300 [Urine:1200; Chest Tube:1100]  EXAM:   General:  Awake. Alert. No acute distress. Sitting up in bed eating dinner and watching TV.   Integument:  Warm & dry. No rash on exposed skin.  HEENT:  Moist mucus membranes.No scleral injection or icterus.  Cardiovascular:  Regular rate. Pitting lower extremity edema. No appreciable JVD given body positioning.  Pulmonary:  Slightly decreased breath sounds bilateral lung bases. Normal work of breathing on nasal cannula oxygen. Speaking in complete sentences. Abdomen: Soft. Normal bowel sounds. Nondistended. Grossly nontender. Neurological: Grossly nonfocal. Moving all 4 extremities equally. Alert and oriented 4.   PULMONARY  Recent Labs Lab 01/27/16 1705 01/28/16 0622  O2SAT 50.5 53.2   CBC  Recent Labs Lab 01/28/16 0510 01/30/16 0410 02/01/16 0415  HGB 9.1* 10.0* 10.0*  HCT 29.2* 31.7* 32.0*  WBC 13.1* 10.4 11.2*  PLT 126* 155 135*   COAGULATION  Recent Labs Lab 01/28/16 0510 01/29/16 0500 01/30/16 0410 01/31/16 0400 02/01/16 0415  INR 2.15* 1.62* 1.47 1.64* 1.89*   CARDIAC  No results for input(s): TROPONINI in the last 168 hours. No results for input(s): PROBNP in the last 168 hours.  CHEMISTRY  Recent Labs Lab 01/26/16 0455  01/27/16 1707 01/28/16 0510 01/29/16 0500 01/30/16 0410 02/01/16 0415  NA 137  < > 130* 134* 133* 133* 132*  K 5.1  < > 4.8 4.0 3.8 4.0 4.7  CL 90*  < > 89* 91* 94* 94* 96*  CO2 37*  < > 33* 35* 33* 32 30  GLUCOSE 108*  < > 162* 114* 99 105* 95  BUN 35*  < > 38* 34* 35* 31* 24*  CREATININE 1.32*  < > 1.56* 1.35* 1.32* 1.07 0.99  CALCIUM 8.2*  < > 8.1* 7.9* 8.0* 7.9* 8.0*  MG 1.8  --   --   --  1.8 1.9  --   PHOS 4.3  --   --   --  3.7 3.4  --   < > = values in this interval not displayed. Estimated Creatinine Clearance: 71.4 mL/min (by C-G formula based on Cr of 0.99).  LIVER  Recent Labs Lab 01/28/16 0510 01/29/16 0500 01/30/16 0410 01/31/16 0400 02/01/16 0415  AST 53*  --   --   --   --   ALT 80*  --   --   --   --   ALKPHOS 69  --   --   --   --   BILITOT 1.4*  --   --   --   --   PROT 4.7*  --   --    --   --   ALBUMIN 2.1*  --   --   --   --   INR 2.15* 1.62* 1.47 1.64* 1.89*     INFECTIOUS  Recent Labs Lab 01/27/16 1311 01/28/16 0510 01/29/16 0500  LATICACIDVEN 2.2*  --   --   PROCALCITON 4.24 5.74 4.32     ENDOCRINE CBG (last 3)  No results for input(s): GLUCAP in the last 72 hours.       IMAGING x48h  - image(s) personally visualized  -   highlighted in bold Dg Chest 2 View  02/01/2016  CLINICAL DATA:  Aortic valve replacement. EXAM: CHEST  2 VIEW COMPARISON:  01/28/2016. FINDINGS: Right PICC line in stable position. Bilateral chest tubes in stable position. No pneumothorax. Prior CABG. Prior cardiac valve replacement. Cardiomegaly with normal pulmonary vascularity. Low lung volumes with mild basilar atelectasis and/or infiltrate, improved from prior exam . Small bilateral pleural effusions. These are stable. IMPRESSION: 1. Right PICC line and bilateral chest tubes stable position. No pneumothorax. 2. Low lung volumes with mild basilar atelectasis and/or infiltrates, improved from prior exam. Stable small bilateral pleural effusions. 3. Prior CABG and cardiac valve replacement. Stable cardiomegaly. No pulmonary venous congestion. Electronically Signed   By: Maisie Fushomas  Register   On: 02/01/2016 07:37    ASSESSMENT/PLAN:  65 y.o. [name] s/p CABG with sepsis. Sepsis likely secondary to acalculous cholecystitis. Continues to clinically improve. Status post bilateral pleural catheter placement. Continuing to have drainage from pleural catheters. Patient continuing a short course of empiric cefepime for sepsis. No further signs or symptoms suggestive of infection.  1. Acute Hypoxic Respiratory Failure: Likely secondary to pulmonary edema. Continuing to improve with pleural fluid drainage and auto diuresis. 2. Acute Hypercarbic Respiratory Failure:  Resolving with treatment of underlying COPD exacerbation and pleural effusions. 3. COPD Exacerbation:  Patient continuing on Brovana &  Budesonide nebs bid along with Spiriva. 4. Bilateral Pleural Effusions:  Status post bilateral pleural catheter placement. Draining pleural catheters daily.  5. Acute Renal Failure:  Resolved. 6. Anemia:  No signs of active bleeding. Hgb Stable. 7. Sepsis:  No clear source of infection. Patient has had multiple antibiotic courses with negative cultures except for a fungal UTI. Continuing on empiric Cefepime Day #6/7. 8. Fungal UTI:  S/P Treatment with Diflucan. Foley catheter has since been removed. 9. Atrial Fibrillation:  Currently on Coumadin, Digoxin, & Lopressor bid.  Given patient's continued clinical improvement I will sign off at this time. Please contact me if I can be of any further  assistance in the care of this patient.  Donna Christen Jamison Neighbor, M.D. [facility] Pager:  [phone] After 3pm or if no response, call [phone] 3:59 PM 02/01/2016

## 2016-02-01 NOTE — Progress Notes (Addendum)
[name]       [place] [address]             [phone]      7 Days Post-Op Procedure(s) (LRB): INSERTION Bilateral PLEURAL DRAINAGE CATHETER (Bilateral) Subjective: Feels steadily stronger  Objective: Vital signs in last 24 hours: Temp:  [97.4 F (36.3 C)-97.7 F (36.5 C)] 97.4 F (36.3 C) (03/27 0417) Pulse Rate:  [78-93] 78 (03/27 0417) Cardiac Rhythm:  [-] Atrial fibrillation (03/26 1930) Resp:  [15-24] 18 (03/27 0417) BP: (107-147)/(60-85) 107/60 mmHg (03/27 0417) SpO2:  [93 %-100 %] 100 % (03/27 0417) Weight:  [147 lb 11.2 oz (66.996 kg)-147 lb 14.9 oz (67.1 kg)] 147 lb 11.2 oz (66.996 kg) (03/27 0417)  Hemodynamic parameters for last 24 hours:    Intake/Output from previous day: 03/26 0701 - 03/27 0700 In: 400 [P.O.:250; IV Piggyback:150] Out: 1650 [Urine:550; Chest Tube:1100] Intake/Output this shift:    General appearance: alert, cooperative and no distress Heart: irregularly irregular rhythm and no murmur Lungs: coarse, dim in bases Abdomen: benign Extremities: + LE edema Wound: incis healing well, mod echymosis LE's  Lab Results:  Recent Labs  01/30/16 0410 02/01/16 0415  WBC 10.4 11.2*  HGB 10.0* 10.0*  HCT 31.7* 32.0*  PLT 155 135*   BMET:  Recent Labs  01/30/16 0410 02/01/16 0415  NA 133* 132*  K 4.0 4.7  CL 94* 96*  CO2 32 30  GLUCOSE 105* 95  BUN 31* 24*  CREATININE 1.07 0.99  CALCIUM 7.9* 8.0*    PT/INR:  Recent Labs  02/01/16 0415  LABPROT 21.6*  INR 1.89*   ABG    Component Value Date/Time   PHART 7.366 01/12/2016 1529   HCO3 33.4* 01/12/2016 1529   TCO2 41 01/21/2016 0849   ACIDBASEDEF 3.0* 01/11/2016 0728   O2SAT 53.2 01/28/2016 0622   CBG (last 3)  No results for input(s): GLUCAP in the last 72 hours.  Meds Scheduled Meds: . antiseptic oral rinse  7 mL Mouth Rinse BID  . arformoterol  15 mcg Nebulization BID  . aspirin EC  81 mg Oral Daily  . budesonide (PULMICORT) nebulizer  solution  0.5 mg Nebulization BID  . digoxin  0.25 mg Oral Daily  . famotidine  20 mg Oral Daily  . guaiFENesin  1,200 mg Oral BID  . metoprolol tartrate  12.5 mg Oral BID  . moving right along book   Does not apply Once  . sodium chloride flush  10-40 mL Intracatheter Q12H  . sodium chloride flush  3 mL Intravenous Q12H  . tiotropium  18 mcg Inhalation Daily  . Warfarin - Physician Dosing Inpatient   Does not apply q1800   Continuous Infusions:  PRN Meds:.sodium chloride, acetaminophen, ondansetron **OR** ondansetron (ZOFRAN) IV, oxyCODONE, sodium chloride flush, sodium chloride flush, traMADol  Xrays Dg Chest 2 View  02/01/2016  CLINICAL DATA:  Aortic valve replacement. EXAM: CHEST  2 VIEW COMPARISON:  01/28/2016. FINDINGS: Right PICC line in stable position. Bilateral chest tubes in stable position. No pneumothorax. Prior CABG. Prior cardiac valve replacement. Cardiomegaly with normal pulmonary vascularity. Low lung volumes with mild basilar atelectasis and/or infiltrate, improved from prior exam . Small bilateral pleural effusions. These are stable. IMPRESSION: 1. Right PICC line and bilateral chest tubes stable position. No pneumothorax. 2. Low lung volumes with mild basilar atelectasis and/or infiltrates, improved from prior exam. Stable small bilateral pleural effusions. 3. Prior CABG and cardiac valve replacement. Stable cardiomegaly.  No pulmonary venous congestion. Electronically Signed   By: Maisie Fushomas  Register   On: 02/01/2016 07:37    Assessment/Plan: S/P Procedure(s) (LRB): INSERTION Bilateral PLEURAL DRAINAGE CATHETER (Bilateral)  1 doing better  2 cont pulm toilet/rehab 3 labs stable 4 INR risiing- cont coumadin 5 might benefit from diuretic 6 afeb without leukocytosis  LOS: 37 days    [name],Craig [name] 02/01/2016   Chart reviewed, patient examined, agree with above. He is doing fairly well overall.  Antibiotics stopped by Dr. Molli KnockYacoub but no note written since last week. I  think he needs to be on Maxipime for 7 days and he has had 4 days. Will resume.  Discussed CIR with Ottie GlazierBarbara Boyette and I think he can go in a day or two if he continues to do well from a medical standpoint. He does not need diuretic. He is losing about 1L per day via PleurX catheters and edema is resolving on its own. Continue to encourage nutrition.

## 2016-02-01 NOTE — Progress Notes (Signed)
I met with pt at bedside to discuss his potential rehab admission to inpt rehab. He feels that he will need an admission prior to d/c home with his sister, Butch Penny. He feels he is not quite ready for it though. Dr. Cyndia Bent, please clarify when you feel pt would be medically ready for an inpt rehab admission. Please call me with any questions. [phone]

## 2016-02-01 NOTE — Discharge Instructions (Signed)
Information on my medicine - Coumadin   (Warfarin)  This medication education was reviewed with me or my healthcare representative as part of my discharge preparation.  The pharmacist that spoke with me during my hospital stay was:  Lavenia Atlas, [facility]  Why was Coumadin prescribed for you? Coumadin was prescribed for you because you have a blood clot or a medical condition that can cause an increased risk of forming blood clots. Blood clots can cause serious health problems by blocking the flow of blood to the heart, lung, or brain. Coumadin can prevent harmful blood clots from forming. As a reminder your indication for Coumadin is:   Blood Clot Prevention After Heart Valve Surgery  What test will check on my response to Coumadin? While on Coumadin (warfarin) you will need to have an INR test regularly to ensure that your dose is keeping you in the desired range. The INR (international normalized ratio) number is calculated from the result of the laboratory test called prothrombin time (PT).  If an INR APPOINTMENT HAS NOT ALREADY BEEN MADE FOR YOU please schedule an appointment to have this lab work done by your health care provider within 7 days. Your INR goal is usually a number between:  2 to 3 or your provider may give you a more narrow range like 2-2.5.  Ask your health care provider during an office visit what your goal INR is.  What  do you need to  know  About  COUMADIN? Take Coumadin (warfarin) exactly as prescribed by your healthcare provider about the same time each day.  DO NOT stop taking without talking to the doctor who prescribed the medication.  Stopping without other blood clot prevention medication to take the place of Coumadin may increase your risk of developing a new clot or stroke.  Get refills before you run out.  What do you do if you miss a dose? If you miss a dose, take it as soon as you remember on the same day then continue your regularly scheduled regimen the  next day.  Do not take two doses of Coumadin at the same time.  Important Safety Information A possible side effect of Coumadin (Warfarin) is an increased risk of bleeding. You should call your healthcare provider right away if you experience any of the following: ? Bleeding from an injury or your nose that does not stop. ? Unusual colored urine (red or dark brown) or unusual colored stools (red or black). ? Unusual bruising for unknown reasons. ? A serious fall or if you hit your head (even if there is no bleeding).  Some foods or medicines interact with Coumadin (warfarin) and might alter your response to warfarin. To help avoid this: ? Eat a balanced diet, maintaining a consistent amount of Vitamin K. ? Notify your provider about major diet changes you plan to make. ? Avoid alcohol or limit your intake to 1 drink for women and 2 drinks for men per day. (1 drink is 5 oz. wine, 12 oz. beer, or 1.5 oz. liquor.)  Make sure that ANY health care provider who prescribes medication for you knows that you are taking Coumadin (warfarin).  Also make sure the healthcare provider who is monitoring your Coumadin knows when you have started a new medication including herbals and non-prescription products.  Coumadin (Warfarin)  Major Drug Interactions  Increased Warfarin Effect Decreased Warfarin Effect  Alcohol (large quantities) Antibiotics (esp. Septra/Bactrim, Flagyl, Cipro) Amiodarone (Cordarone) Aspirin (ASA) Cimetidine (Tagamet) Megestrol (Megace) NSAIDs (  ibuprofen, naproxen, etc.) °Piroxicam (Feldene) °Propafenone (Rythmol SR) °Propranolol (Inderal) °Isoniazid (INH) °Posaconazole (Noxafil) Barbiturates (Phenobarbital) °Carbamazepine (Tegretol) °Chlordiazepoxide (Librium) °Cholestyramine (Questran) °Griseofulvin °Oral Contraceptives °Rifampin °Sucralfate (Carafate) °Vitamin K  ° °Coumadin® (Warfarin) Major Herbal Interactions  °Increased Warfarin Effect Decreased Warfarin Effect   °Garlic °Ginseng °Ginkgo biloba Coenzyme Q10 °Green tea °St. John’s wort   ° °Coumadin® (Warfarin) FOOD Interactions  °Eat a consistent number of servings per week of foods HIGH in Vitamin K °(1 serving = ½ cup)  °Collards (cooked, or boiled & drained) °Kale (cooked, or boiled & drained) °Mustard greens (cooked, or boiled & drained) °Parsley *serving size only = ¼ cup °Spinach (cooked, or boiled & drained) °Swiss chard (cooked, or boiled & drained) °Turnip greens (cooked, or boiled & drained)  °Eat a consistent number of servings per week of foods MEDIUM-HIGH in Vitamin K °(1 serving = 1 cup)  °Asparagus (cooked, or boiled & drained) °Broccoli (cooked, boiled & drained, or raw & chopped) °Brussel sprouts (cooked, or boiled & drained) *serving size only = ½ cup °Lettuce, raw (green leaf, endive, romaine) °Spinach, raw °Turnip greens, raw & chopped  ° °These websites have more information on Coumadin (warfarin):  www.coumadin.com; °www.ahrq.gov/consumer/coumadin.htm; ° ° °

## 2016-02-01 NOTE — Progress Notes (Signed)
CARDIAC REHAB PHASE I   PRE:  Rate/Rhythm: 86 a. fib  BP:  Sitting: 106/72        SaO2: 96 3L  MODE:  Ambulation: 60 ft   POST:  Rate/Rhythm: 113 a. fib  BP:  Sitting: 119/88         SaO2: 94 3L  Pt in bed, states he just received a breathing treatment and he now feels more short of breath. Pt agreeable to walk, stood with minimal assistance, however did need reminder cues for sternal precautions as pt has a tendency to use his arms. Pt ambulated 60 ft on 3L O2, gait belt, rollator, assist x2, fairly steady gait, tolerated fair. Pt did c/o DOE, fatigue, declined rest stop. Good use of pursed lip breathing, oxygen sats stable on 3L O2. Pt to recliner after walk, feet elevated, call bell within reach. Encouraged IS, flutter valve. Will follow.  [phone] Joylene GrapesEmily C Chisom Aust, RN, BSN 02/01/2016 9:42 AM

## 2016-02-02 LAB — PROTIME-INR
INR: 1.74 — AB (ref 0.00–1.49)
PROTHROMBIN TIME: 20.3 s — AB (ref 11.6–15.2)

## 2016-02-02 MED ORDER — WARFARIN SODIUM 4 MG PO TABS
4.0000 mg | ORAL_TABLET | Freq: Once | ORAL | Status: AC
  Administered 2016-02-02: 4 mg via ORAL
  Filled 2016-02-02: qty 1

## 2016-02-02 NOTE — PMR Pre-admission (Signed)
PMR Admission Coordinator Pre-Admission Assessment  Patient: Craig [name] is an 65 y.o., male MRN: [id number] DOB: 1951-06-21 Height:  (175.3 cm) Weight: 75.1 kg (165 lb 9.1 oz)              Insurance Information HMO:     PPO:      PCP:      IPA:      80/20:      OTHER:  PRIMARY: uninsured       Medicaid Application Date:       Case Manager:  Disability Application Date:       Case Worker:  Medicaid and disability applications begun  Emergency Contact Information Contact Information    Name Relation Home Work Mobile   [place] Sister   [phone]   Jareb, Radoncic [phone]       Current Medical History  Patient Admitting Diagnosis: debility related to sepsis and AS with subsequent CABG/AVR  History of Present Illness: TWAIN STENSETH is a 65 y.o. right handed male with history of COPD/tobacco and alcohol abuse.  Presented 12/26/2015 with flulike symptoms including shortness of breath, cough and low-grade fever as well as chills 2 days. Nonspecific chest tightness. He was evaluated in the ED found to be hypoxic placed on supplemental oxygen. Chest x-ray showed right upper lobe pneumonia. CT angiogram of the chest no pulmonary emboli. Troponin elevated 2.32. Placed on broad-spectrum antibiotics. Cardiology services consulted. EKG demonstrated variable sinus tachycardia and ectopic atrial tachycardia, inferior Q waves, transient inferior ST-T wave abnormalities. Echocardiogram with ejection fraction of 40%. Akinesis of the mid-apical inferior lateral myocardium. Critical care pulmonary services was consulted 12/27/2015 for increasing shortness of breath patient ultimately intubated. Cardiac catheterization 12/29/2015 showed 50% ostial LM stenosis and three-vessel coronary artery disease with severe aortic stenosis. Cardiothoracic surgery Dr. Laneta Simmers consulted with patient stabilized and underwent CABG with aortic valve replacement 01/07/2016. Follow-up echocardiogram  01/11/2016 showed LV and RV systolic function normal. Patient currently maintained on Coumadin therapy. He was extubated and diet has been slowly advanced to regular.   He was placed on broad-spectrum antibiotics. He developed some large pleural effusions and ultimately it was felt that he would best be served by placement of Pleurx catheters which was done bilaterally. He continues to have significant output during the drainage procedures daily. O2 at 2 liters nasal cannula.  Medical History  Past Medical History  Diagnosis Date  . COPD (chronic obstructive pulmonary disease) (HCC)   . Hypertension   . Hyperlipidemia   . Alcohol abuse     Prior  . Tobacco abuse     Prior    Family History  family history includes Diabetes in his father.  Prior Rehab/Hospitalizations:  Has the patient had major surgery during 100 days prior to admission? No  Current Medications   Current facility-administered medications:  .  0.9 %  sodium chloride infusion, 250 mL, Intravenous, PRN, Alleen Borne, MD, 250 mL at 02/03/16 0929 .  acetaminophen (TYLENOL) tablet 650 mg, 650 mg, Oral, Q6H PRN, Alleen Borne, MD .  antiseptic oral rinse (CPC / CETYLPYRIDINIUM CHLORIDE 0.05%) solution 7 mL, 7 mL, Mouth Rinse, BID, Alleen Borne, MD, 7 mL at 02/03/16 0934 .  arformoterol (BROVANA) nebulizer solution 15 mcg, 15 mcg, Nebulization, BID, Coralyn Helling, MD, 15 mcg at 02/03/16 0811 .  aspirin EC tablet 81 mg, 81 mg, Oral, Daily, Alleen Borne, MD, 81 mg at 02/03/16 0932 .  budesonide (PULMICORT) nebulizer solution 0.5 mg, 0.5  mg, Nebulization, BID, Coralyn Helling, MD, 0.5 mg at 02/03/16 0812 .  digoxin (LANOXIN) tablet 0.25 mg, 0.25 mg, Oral, Daily, Purcell Nails, MD, 0.25 mg at 02/03/16 0932 .  famotidine (PEPCID) tablet 20 mg, 20 mg, Oral, Daily, Alleen Borne, MD, 20 mg at 02/03/16 0932 .  guaiFENesin (MUCINEX) 12 hr tablet 1,200 mg, 1,200 mg, Oral, BID, Alyson Reedy, MD, 1,200 mg at 02/03/16 0932 .   metoprolol tartrate (LOPRESSOR) tablet 12.5 mg, 12.5 mg, Oral, BID, Alleen Borne, MD, 12.5 mg at 02/03/16 0933 .  moving right along book, , Does not apply, Once, Alleen Borne, MD .  ondansetron (ZOFRAN) tablet 4 mg, 4 mg, Oral, Q6H PRN **OR** ondansetron (ZOFRAN) injection 4 mg, 4 mg, Intravenous, Q6H PRN, Alleen Borne, MD .  oxyCODONE (Oxy IR/ROXICODONE) immediate release tablet 5-10 mg, 5-10 mg, Oral, Q3H PRN, Alleen Borne, MD, 10 mg at 02/02/16 1652 .  sodium chloride flush (NS) 0.9 % injection 10-40 mL, 10-40 mL, Intracatheter, Q12H, Alleen Borne, MD, 10 mL at 01/31/16 1202 .  sodium chloride flush (NS) 0.9 % injection 10-40 mL, 10-40 mL, Intracatheter, PRN, Alleen Borne, MD, 10 mL at 02/03/16 0931 .  sodium chloride flush (NS) 0.9 % injection 3 mL, 3 mL, Intravenous, Q12H, Alleen Borne, MD, 3 mL at 02/01/16 0951 .  sodium chloride flush (NS) 0.9 % injection 3 mL, 3 mL, Intravenous, PRN, Alleen Borne, MD .  tiotropium [facility]) inhalation capsule 18 mcg, 18 mcg, Inhalation, Daily, Rolly Salter, MD, 18 mcg at 02/03/16 0810 .  traMADol (ULTRAM) tablet 50-100 mg, 50-100 mg, Oral, Q4H PRN, Alleen Borne, MD, 100 mg at 02/01/16 1513 .  warfarin (COUMADIN) tablet 3 mg, 3 mg, Oral, ONCE-1800, Wayne E Gold, PA-C .  Warfarin - Physician Dosing Inpatient, , Does not apply, q1800, Alleen Borne, MD  Patients Current Diet: Diet regular Room service appropriate?: Yes; Fluid consistency:: Thin  Precautions / Restrictions Precautions Precautions: Fall, Sternal Restrictions Weight Bearing Restrictions: Yes (sternal precautions) Other Position/Activity Restrictions: sternal precautions   Has the patient had 2 or more falls or a fall with injury in the past year?No  Prior Activity Level Community (5-7x/wk): independent and driving pta. 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  Home Assistive Devices / Equipment Home Assistive  Devices/Equipment: None Home Equipment: None  Prior Device Use: Indicate devices/aids used by the patient prior to current illness, exacerbation or injury? None of the above  Prior Functional Level Prior Function Level of Independence: Independent  Self Care: Did the patient need help bathing, dressing, using the toilet or eating?  Independent  Indoor Mobility: Did the patient need assistance with walking from room to room (with or without device)? Independent  Stairs: Did the patient need assistance with internal or external stairs (with or without device)? Independent  Functional Cognition: Did the patient need help planning regular tasks such as shopping or remembering to take medications? Independent  Current Functional Level Cognition  Overall Cognitive Status: Within Functional Limits for tasks assessed Orientation Level: Oriented X4    Extremity Assessment (includes Sensation/Coordination)  Upper Extremity Assessment: Defer to OT evaluation, Generalized weakness  Lower Extremity Assessment: Generalized weakness    ADLs       Mobility  Overal bed mobility: Needs Assistance Bed Mobility: Sit to Supine Rolling: Min guard Sidelying to sit: Min guard, HOB elevated Supine to sit: Mod  assist, HOB elevated Sit to supine: Min assist, HOB elevated General bed mobility comments: Assist to bring LLE into bed, HOB elevated. CUes to adhere to sternal precautions.    Transfers  Overall transfer level: Needs assistance Equipment used: 4-wheeled walker Transfers: Sit to/from Stand Sit to Stand: Mod assist, +2 physical assistance Stand pivot transfers: Min assist General transfer comment: Not able to stand with assist of 1 from low recliner due to weakness in BLEs despite cues for momentum and technique. Pt already has it in his mind he cannot do it without assist of 2. Able to stand with second person holding heart pillow. Stood from rollator seat. Cues not to use UEs.     Ambulation / Gait / Stairs / Wheelchair Mobility  Ambulation/Gait Ambulation/Gait assistance: Hydrographic surveyor (Feet): 50 Feet (x2 bouts) Assistive device: 4-wheeled walker Gait Pattern/deviations: Step-through pattern, Decreased stride length, Trunk flexed General Gait Details: Slow, steady gait with 2/4 DOE. Sp02 90% on 3L/min 02. 1 seated rest break. Instructed pt how to use rollator brakes and seat. Self limited ambulation distance. Gait velocity interpretation: Below normal speed for age/gender    Posture / Balance Dynamic Sitting Balance Sitting balance - Comments: Sitting in chair without back support, fatigues quickly.  Balance Overall balance assessment: Needs assistance Sitting-balance support: Feet supported, No upper extremity supported Sitting balance-Leahy Scale: Fair Sitting balance - Comments: Sitting in chair without back support, fatigues quickly.  Standing balance support: During functional activity Standing balance-Leahy Scale: Poor Standing balance comment: Reliant on BUEs for support.     Special needs/care consideration Oxygen O2 at 2 liters nasal cannula Skin intact but with lots of bruising to surgical sitd to lower extremities                               Bowel mgmt: continent LBM 02/02/16  Bladder mgmt: continent Bilateral Pleurex catheters drained daily. Dr. Laneta Simmers states he would not d/c them until drainage less than 100 cc. Has been 900-1000cc daily. Pt and his sister aware likely to go home with and teaching to be done prior to d/c   Previous Home Environment Living Arrangements: Children (pt lived with his 29 yo son, Jeannett Senior)  Lives With: Son Available Help at Discharge: Family, Available 24 hours/day (pt to go home withhis ister, [name] for 24/7 supervision) Type of Home: House Home Layout: One level Home Access: Stairs to enter Entrance Stairs-Rails: Right Entrance Stairs-Number of Steps: 3? Bathroom Shower/Tub: Arts development officer: Yes How Accessible: Accessible via walker Home Care Services: No  Discharge Living Setting Plans for Discharge Living Setting: Lives with (comment) (Pt to go home with his sister, [name] at d/c) Type of Home at Discharge: House Discharge Home Layout: One level Discharge Home Access: Stairs to enter Entrance Stairs-Rails: Right Entrance Stairs-Number of Steps: 2 very low profile steps Discharge Bathroom Shower/Tub: Tub/shower unit, Curtain Discharge Bathroom Toilet: Handicapped height Discharge Bathroom Accessibility: Yes How Accessible: Accessible via walker (rails around toliet, pull down shower head, handicapped ht b) Does the patient have any problems obtaining your medications?: Yes (Describe) (uninsured) Donna's plan is for pt to live next door to her in home that is handicapped accessible and she can provide all needed care.  Social/Family/Support Systems Patient Roles: Parent Contact Information: Betsey Holiday, sister. Son, Jeannett Senior is 49 years old Anticipated Caregiver: sister, [name] Anticipated Caregiver's Contact Information: see above Ability/Limitations of Caregiver: [name] has  no limitations Caregiver Availability: 24/7 Discharge Plan Discussed with Primary Caregiver: Yes Is Caregiver In Agreement with Plan?: Yes Does Caregiver/Family have Issues with Lodging/Transportation while Pt is in Rehab?: No  Goals/Additional Needs Patient/Family Goal for Rehab: supervision with PT and OT Expected length of stay: ELOS 8-12 days Equipment Needs: Plerex catheters bilaterally drained once daily. Likely to d/c home with catheters  Special Service Needs: Pt's son, Jeannett SeniorStephen is being cared for by pt's brother Additional Information: Pt's MOm died while pt hospitalized. Pt is aware. His sister, Lupita LeashDonna, was her caregiver Pt/Family Agrees to Admission and willing to participate: Yes Program Orientation Provided & Reviewed with  Pt/Caregiver Including Roles  & Responsibilities: Yes  Decrease burden of Care through IP rehab admission: n/a  Possible need for SNF placement upon discharge:not anticipated  Patient Condition: This patient's medical and functional status has changed since the consult dated: 01/22/2016 in which the Rehabilitation Physician determined and documented that the patient's condition is appropriate for intensive rehabilitative care in an inpatient rehabilitation facility. See "History of Present Illness" (above) for medical update. Functional changes are: min to mod assist overall. Patient's medical and functional status update has been discussed with the Rehabilitation physician and patient remains appropriate for inpatient rehabilitation. Will admit to inpatient rehab today.  Preadmission Screen Completed By:  Clois DupesBoyette, Yeshua Stryker Godwin, 02/03/2016 10:52 AM ______________________________________________________________________   Discussed status with Dr. Allena KatzPatel on 02/03/16 at  1052 and received telephone approval for admission today.  Admission Coordinator:  Clois DupesBoyette, Onur Mori Godwin, time [phone] Date 02/03/2016

## 2016-02-02 NOTE — Progress Notes (Addendum)
[address]       [place] [id number]             [phone]      8 Days Post-Op Procedure(s) (LRB): INSERTION Bilateral PLEURAL DRAINAGE CATHETER (Bilateral) Subjective: Feels better, less SOB  Objective: Vital signs in last 24 hours: Temp:  [97.2 F (36.2 C)-97.5 F (36.4 C)] 97.5 F (36.4 C) (03/27 2152) Pulse Rate:  [76-82] 76 (03/27 2152) Cardiac Rhythm:  [-] Atrial fibrillation (03/27 2132) Resp:  [18] 18 (03/27 2152) BP: (106-124)/(72-88) 106/72 mmHg (03/27 2152) SpO2:  [98 %-100 %] 100 % (03/27 2152)  Hemodynamic parameters for last 24 hours:    Intake/Output from previous day: 03/27 0701 - 03/28 0700 In: -  Out: 2075 [Urine:1075; Chest Tube:1000] Intake/Output this shift:    General appearance: alert, cooperative and no distress Heart: irregularly irregular rhythm Lungs: mildly dim in bases Abdomen: benign Extremities: 1+ edema, improved Wound: incis healing well, echymosis stable  Lab Results:  Recent Labs  02/01/16 0415  WBC 11.2*  HGB 10.0*  HCT 32.0*  PLT 135*   BMET:  Recent Labs  02/01/16 0415  NA 132*  K 4.7  CL 96*  CO2 30  GLUCOSE 95  BUN 24*  CREATININE 0.99  CALCIUM 8.0*    PT/INR:  Recent Labs  02/02/16 0449  LABPROT 20.3*  INR 1.74*   ABG    Component Value Date/Time   PHART 7.366 01/12/2016 1529   HCO3 33.4* 01/12/2016 1529   TCO2 41 01/21/2016 0849   ACIDBASEDEF 3.0* 01/11/2016 0728   O2SAT 53.2 01/28/2016 0622   CBG (last 3)  No results for input(s): GLUCAP in the last 72 hours.  Meds Scheduled Meds: . antiseptic oral rinse  7 mL Mouth Rinse BID  . arformoterol  15 mcg Nebulization BID  . aspirin EC  81 mg Oral Daily  . budesonide (PULMICORT) nebulizer solution  0.5 mg Nebulization BID  . ceFEPime (MAXIPIME) IV  2 g Intravenous Q12H  . digoxin  0.25 mg Oral Daily  . famotidine  20 mg Oral Daily  . guaiFENesin  1,200 mg Oral BID  . metoprolol tartrate  12.5 mg Oral BID  .  moving right along book   Does not apply Once  . sodium chloride flush  10-40 mL Intracatheter Q12H  . sodium chloride flush  3 mL Intravenous Q12H  . tiotropium  18 mcg Inhalation Daily  . Warfarin - Physician Dosing Inpatient   Does not apply q1800   Continuous Infusions:  PRN Meds:.sodium chloride, acetaminophen, ondansetron **OR** ondansetron (ZOFRAN) IV, oxyCODONE, sodium chloride flush, sodium chloride flush, traMADol  Xrays Dg Chest 2 View  02/01/2016  CLINICAL DATA:  Aortic valve replacement. EXAM: CHEST  2 VIEW COMPARISON:  01/28/2016. FINDINGS: Right PICC line in stable position. Bilateral chest tubes in stable position. No pneumothorax. Prior CABG. Prior cardiac valve replacement. Cardiomegaly with normal pulmonary vascularity. Low lung volumes with mild basilar atelectasis and/or infiltrate, improved from prior exam . Small bilateral pleural effusions. These are stable. IMPRESSION: 1. Right PICC line and bilateral chest tubes stable position. No pneumothorax. 2. Low lung volumes with mild basilar atelectasis and/or infiltrates, improved from prior exam. Stable small bilateral pleural effusions. 3. Prior CABG and cardiac valve replacement. Stable cardiomegaly. No pulmonary venous congestion. Electronically Signed   By: Maisie Fushomas  Register   On: 02/01/2016 07:37    Assessment/Plan: S/P Procedure(s) (LRB): INSERTION Bilateral PLEURAL DRAINAGE CATHETER (Bilateral)  1  steady progress 2 give 4 mg coumadin today 3 cont pleurx drainage 4 hopefully ready for CIR soon   LOS: 38 days    GOLD,WAYNE E 02/02/2016   Chart reviewed, patient examined, agree with above. He looks good. His INR is 1.74. I think he is going to need a little higher dose since he is eating better. He should finish his antibiotics after tomorrow. I think he is medically stable to go to CIR tomorrow if bed available and we will follow closely there.

## 2016-02-02 NOTE — Progress Notes (Signed)
Per family, if CIR is unable to admit, patient will discharge home with 24 hour family care and does not want SNF.  Osborne Cascoadia Palestine Mosco LCSWA [phone]

## 2016-02-02 NOTE — Progress Notes (Signed)
CARDIAC REHAB PHASE I   PRE:  Rate/Rhythm: 79 a. fib  BP:  Sitting: 108/76        SaO2: 97 2L  MODE:  Ambulation: 60 ft   POST:  Rate/Rhythm: 105 a. fib  BP:  Sitting: 153/87         SaO2: 94 2L  Pt ambulated 60 ft on 2L O2, rollator, IV, gait belt, assist x2 for safety/equipment, slow, steady gait, tolerated fair. Pt somewhat impulsive, fatigues easily, c/o DOE, good use of pursed lip breathing, did require verbal reminder cues of sternal precautions. Pt declined rest break, although, likely would have increased distance with rest periods. PT to see this afternoon. Encouraged ambulation, IS, flutter valve. Pt to recliner after walk, feet elevated, call bell within reach. Will follow.   [phone] Joylene GrapesEmily C Sherlon Nied, RN, BSN 02/02/2016 11:32 AM

## 2016-02-02 NOTE — Progress Notes (Signed)
I met with pt at bedside to discuss an inpt rehab admit tomorrow per RN CM discussion with Dr. Cyndia Bent. Pt is hesitant , but in agreement. I willl follow up tomorrow. [phone]

## 2016-02-02 NOTE — Progress Notes (Signed)
PT Cancellation Note  Patient Details Name: Craig FeinsteinRobert S Aguilar MRN: [id number] DOB: 10/09/1951   Cancelled Treatment:    Reason Eval/Treat Not Completed: Pain limiting ability to participate;Other (comment) (Pt had weak buckling legs with nursing earlier).  Has received meds and nursing in to assess him.  Will try tomorrow.   Ivar DrapeStout, Emmersyn Kratzke E 02/02/2016, 3:09 PM   Samul Dadauth Emanuell Morina, PT MS Acute Rehab Dept. Number: [facility] [phone] and [facility] [phone]

## 2016-02-02 NOTE — [place] FL2 (Signed)
[place] MEDICAID FL2 LEVEL OF CARE SCREENING TOOL     IDENTIFICATION  Patient Name: Craig [name] Birthdate: 10/16/1951 Sex: male Admission Date (Current Location): 12/26/2015  [facility] [place]   Facility and Address:  [facility], [address], [place], [place] [address]      Provider Number: [id number]  Attending Physician Name and Address:  Alleen BorneBryan K Bartle, MD  Relative Name and Phone Number:  Lupita LeashDonna, sister, [phone]    Current Level of Care: Hospital Recommended Level of Care: Skilled Nursing Facility Prior Approval Number:    Date Approved/Denied:   PASRR Number: [id number]  Discharge Plan: SNF    Current Diagnoses: Patient Active Problem List   Diagnosis Date Noted  . Sepsis (HCC)   . S/P CABG (coronary artery bypass graft)   . Paroxysmal atrial fibrillation (HCC)   . S/P CABG x 4   . Pleural effusion   . Acute respiratory acidosis   . Acute renal failure (ARF) (HCC)   . Severe aortic stenosis   . Coronary artery disease involving native coronary artery of native heart without angina pectoris   . Coronary artery disease involving native coronary artery of native heart with unstable angina pectoris (HCC)   . Acute respiratory failure (HCC)   . CAD (coronary artery disease)   . Aortic stenosis   . Acute respiratory failure with hypoxia (HCC) 12/27/2015  . Elevated troponin   . History of hypertension   . History of hyperlipidemia   . Ascending aortic aneurysm (HCC)   . CAP (community acquired pneumonia) 12/26/2015  . NSTEMI (non-ST elevated myocardial infarction) (HCC) 12/26/2015  . COPD (chronic obstructive pulmonary disease) (HCC) 12/26/2015  . Hypoxia 12/26/2015  . Ruptured lumbar disc 02/17/2015  . Back pain 02/17/2015    Orientation RESPIRATION BLADDER Height & Weight     Self, Time, Situation, Place  O2 (2L) Continent Weight: 66.996 kg (147 lb 11.2 oz) Height:  5\' 9"  (175.3 cm)   BEHAVIORAL SYMPTOMS/MOOD NEUROLOGICAL BOWEL NUTRITION STATUS      Continent  (Please see DC summary)  AMBULATORY STATUS COMMUNICATION OF NEEDS Skin     Verbally Surgical wounds (Closed incisions on chest and leg)                       Personal Care Assistance Level of Assistance  Bathing, Feeding, Dressing Bathing Assistance: Limited assistance Feeding assistance: Independent Dressing Assistance: Limited assistance     Functional Limitations Info  Sight Sight Info: Impaired        SPECIAL CARE FACTORS FREQUENCY  PT (By licensed PT)     PT Frequency: min 3x/week              Contractures      Additional Factors Info  Code Status, Allergies Code Status Info: Full Allergies Info: NKA           Current Medications (02/02/2016):  This is the current hospital active medication list Current Facility-Administered Medications  Medication Dose Route Frequency Provider Last Rate Last Dose  . 0.9 %  sodium chloride infusion  250 mL Intravenous PRN Alleen BorneBryan K Bartle, MD      . acetaminophen (TYLENOL) tablet 650 mg  650 mg Oral Q6H PRN Alleen BorneBryan K Bartle, MD      . antiseptic oral rinse (CPC / CETYLPYRIDINIUM CHLORIDE 0.05%) solution 7 mL  7 mL Mouth Rinse BID Alleen BorneBryan K Bartle, MD   7 mL at 02/01/16  1000  . arformoterol (BROVANA) nebulizer solution 15 mcg  15 mcg Nebulization BID Coralyn Helling, MD   15 mcg at 02/01/16 1944  . aspirin EC tablet 81 mg  81 mg Oral Daily Alleen Borne, MD   81 mg at 02/01/16 0949  . budesonide (PULMICORT) nebulizer solution 0.5 mg  0.5 mg Nebulization BID Coralyn Helling, MD   0.5 mg at 02/01/16 1945  . ceFEPIme (MAXIPIME) 2 g in dextrose 5 % 50 mL IVPB  2 g Intravenous Q12H Alleen Borne, MD   2 g at 02/02/16 0960  . digoxin (LANOXIN) tablet 0.25 mg  0.25 mg Oral Daily Purcell Nails, MD   0.25 mg at 02/01/16 0950  . famotidine (PEPCID) tablet 20 mg  20 mg Oral Daily Alleen Borne, MD   20 mg at 02/01/16 0949  . guaiFENesin (MUCINEX) 12 hr tablet 1,200  mg  1,200 mg Oral BID Alyson Reedy, MD   1,200 mg at 02/01/16 2217  . metoprolol tartrate (LOPRESSOR) tablet 12.5 mg  12.5 mg Oral BID Alleen Borne, MD   12.5 mg at 02/01/16 2217  . moving right along book   Does not apply Once Alleen Borne, MD      . ondansetron [facility]) tablet 4 mg  4 mg Oral Q6H PRN Alleen Borne, MD       Or  . ondansetron (ZOFRAN) injection 4 mg  4 mg Intravenous Q6H PRN Alleen Borne, MD      . oxyCODONE (Oxy IR/ROXICODONE) immediate release tablet 5-10 mg  5-10 mg Oral Q3H PRN Alleen Borne, MD      . sodium chloride flush (NS) 0.9 % injection 10-40 mL  10-40 mL Intracatheter Q12H Alleen Borne, MD   10 mL at 01/31/16 1202  . sodium chloride flush (NS) 0.9 % injection 10-40 mL  10-40 mL Intracatheter PRN Alleen Borne, MD   10 mL at 02/02/16 0451  . sodium chloride flush (NS) 0.9 % injection 3 mL  3 mL Intravenous Q12H Alleen Borne, MD   3 mL at 02/01/16 0951  . sodium chloride flush (NS) 0.9 % injection 3 mL  3 mL Intravenous PRN Alleen Borne, MD      . tiotropium [facility]) inhalation capsule 18 mcg  18 mcg Inhalation Daily Rolly Salter, MD   18 mcg at 02/01/16 0915  . traMADol (ULTRAM) tablet 50-100 mg  50-100 mg Oral Q4H PRN Alleen Borne, MD   100 mg at 02/01/16 1513  . warfarin (COUMADIN) tablet 4 mg  4 mg Oral ONCE-1800 Wayne E Gold, PA-C      . Warfarin - Physician Dosing Inpatient   Does not apply [id number] Alleen Borne, MD         Discharge Medications: Please see discharge summary for a list of discharge medications.  Relevant Imaging Results:  Relevant Lab Results:   Additional Information SSN: [id number] [place], [place]

## 2016-02-02 NOTE — Discharge Summary (Signed)
Physician Discharge Summary  Patient ID: Craig [name] MRN: [id number] DOB/AGE: [birth year] 65 y.o.  Admit date: 12/26/2015 Discharge date: 02/03/2016  Admission Diagnoses:Acute respiratory failure  Discharge Diagnoses:  Principal Problem:   Acute respiratory failure with hypoxia (HCC) Active Problems:   CAP (community acquired pneumonia)   NSTEMI (non-ST elevated myocardial infarction) (HCC)   COPD (chronic obstructive pulmonary disease) (HCC)   Hypoxia   Elevated troponin   History of hypertension   History of hyperlipidemia   Ascending aortic aneurysm (HCC)   Aortic stenosis   Acute respiratory failure (HCC)   CAD (coronary artery disease)   Coronary artery disease involving native coronary artery of native heart with unstable angina pectoris (HCC)   Severe aortic stenosis   Coronary artery disease involving native coronary artery of native heart without angina pectoris   Acute renal failure (ARF) (HCC)   Acute respiratory acidosis   Pleural effusion   S/P CABG x 4   Sepsis (HCC)   S/P CABG (coronary artery bypass graft)   Paroxysmal atrial fibrillation [facility])  Patient Active Problem List   Diagnosis Date Noted  . Sepsis (HCC)   . S/P CABG (coronary artery bypass graft)   . Paroxysmal atrial fibrillation (HCC)   . S/P CABG x 4   . Pleural effusion   . Acute respiratory acidosis   . Acute renal failure (ARF) (HCC)   . Severe aortic stenosis   . Coronary artery disease involving native coronary artery of native heart without angina pectoris   . Coronary artery disease involving native coronary artery of native heart with unstable angina pectoris (HCC)   . Acute respiratory failure (HCC)   . CAD (coronary artery disease)   . Aortic stenosis   . Acute respiratory failure with hypoxia (HCC) 12/27/2015  . Elevated troponin   . History of hypertension   . History of hyperlipidemia   . Ascending aortic aneurysm (HCC)   . CAP (community acquired pneumonia) 12/26/2015   . NSTEMI (non-ST elevated myocardial infarction) (HCC) 12/26/2015  . COPD (chronic obstructive pulmonary disease) (HCC) 12/26/2015  . Hypoxia 12/26/2015  . Ruptured lumbar disc 02/17/2015  . Back pain 02/17/2015   HPI:At time of admission   Craig [name] is a 65 y.o. male with a history of COPD who presents to the ED with complaints of Flu like Symptoms with SOB and Cough and fevers and Chills x 6 days. He had worsening SOB for the past 2 days and Chest Tightness and Chest pain since the AM. He was evalauted in the ED and was hypoxic, to 91% and placed on supplemental NCO2. He was also found to have Pneumonia of the RUL and RLL on CTA of the Chest which was without evidence of a Pulmonary Embolism. He was also found to have an elevated troponin at 2.32. He was placed on IV antibiotic rx for CAP, and Cardiology was consulted and he was referred for admission.   Discharged Condition: good  Hospital Course: The patient was admitted to the emergency department with a community acquired pneumonia and placed on intravenous Rocephin and a Zithromax. Additionally he ruled in for non-STEMI. He was placed on nitroglycerin and heparin drips as well as started on Plavix and aspirin. Additionally he was started on beta blocker. He was seen in consultation by both pulmonary medicine as well as cardiology. He was stabilized medically and underwent multiple diagnostic studies. This included cardiac catheterization and CT scans of the chest. He was found to have severe aortic  stenosis as well as multivessel coronary artery disease. Additionally, he was found to have a thoracic aortic aneurysm. He was felt to require aggressive management of his severe COPD as well as treatment of the pneumonia before consideration of surgery. He was seen in cardiothoracic surgical consultation by Dr. Evelene CroonBryan Bartle who evaluated the patient and his studies and agreed with recommendations. After several days of additional  aggressive medical management he was deemed to be acceptable to proceed with surgery and on 01/07/2016 he underwent the below described procedure. He tolerated the procedure well was taken to the surgical intensive care unit in stable condition.   Postoperatively the patient has progressed slowly. He initially required some inotropic support which was weaned off without significant difficulty. He had postoperative atrial fibrillation and was started on Coumadin. He was progressing nicely but on 01/11/2016 he developed some acute decompensation including acute renal failure with associated hypotension and metabolic acidosis and elevated transaminases suggesting shock liver. He required reintubation. Findings were initially nonspecific but it did appears that he had developed an acute sepsis. Critical care medicine was re-consult to assist with management. Echocardiogram showed LV and RV systolic function to be grossly normal. There was no significant paracardial effusion. He was able to be extubated on 01/12/2016. He was placed on broad-spectrum antibiotics. He developed some large pleural effusions and ultimately it was felt that he would best be served by placement of Pleurx catheters which was done. He continues to have significant output during the drainage procedures for these. His pulmonary status remained a significant issue but patient on a slow and steady improvement over time. His shock liver result. All cultures are negative. He has not had any further pauses or vagal episodes on the cardiac monitor. Due to significant comorbidities as well as prolonged hospitalization he is felt to be a candidate for CIR rehabilitation and tentatively he is felt to be stable for transfer in the next couple days pending ongoing reevaluation of his recovery.  Marland Kitchen. antiseptic oral rinse  7 mL Mouth Rinse BID  . arformoterol  15 mcg Nebulization BID  . aspirin EC  81 mg Oral Daily  . budesonide (PULMICORT) nebulizer  solution  0.5 mg Nebulization BID  . digoxin  0.25 mg Oral Daily  . famotidine  20 mg Oral Daily  . guaiFENesin  1,200 mg Oral BID  . metoprolol tartrate  12.5 mg Oral BID  . moving right along book   Does not apply Once  . sodium chloride flush  10-40 mL Intracatheter Q12H  . sodium chloride flush  3 mL Intravenous Q12H  . tiotropium  18 mcg Inhalation Daily  . warfarin  3 mg Oral ONCE-1800  . Warfarin - Physician Dosing Inpatient   Does not apply q1800   Consults: cardiology and pulmonary/intensive care  Significant Diagnostic Studies: cardiac cath, chest CT. PFT's, abdominal US, echo/TEE  Treatments: surgery:  01/07/2016  Surgeon: Alleen BorneBryan K. Bartle, MD  First Assistant: Doree Fudgeonielle Zimmerman, PA-C   Preoperative Diagnosis: Left main and severe multivessel coronary artery disease, bicuspid aortic valve with severe aortic stenosis, ascending aortic and arch aneurysm.   Postoperative Diagnosis: Same   Procedure:  1. Median Sternotomy 2. Extracorporeal circulation 3. Coronary artery bypass graft surgery x 4 4. Endoscopic vein harvest from both legs. 5. Aortic valve replacement using a 25 mm Edwards Magna-ease pericardial valve. 6. Supra-coronary ascending aortic and hemi-arch replacement using a 30 mm Hemashield graft under deep hypothermic circulatory arrest. 7. Right axillary artery 8 mm Hemashield  graft for arterial cannulation and antegrade cerebral perfusion . 8. Insertion of left femoral arterial line.  Anesthesia: General Endotracheal  Discharge Exam: Blood pressure 100/75, pulse 79, temperature 98 F (36.7 C), temperature source Oral, resp. rate 18, height  (1.753 m), weight 165 lb 9.1 oz (75.1 kg), SpO2 92 %.   General appearance: alert, cooperative and no distress Heart: irregularly irregular rhythm Lungs: fair air exchange throughout, no wheeze or ronchi Abdomen: benign Extremities: + LE edema Wound: incis healing well  Disposition:  CIR for further rehab     Medication List    Notice    You have not been prescribed any medications.      The patient has been discharged on:   1.Beta Blocker:  Yes [  y ]                              No   [   ]                              If No, reason:  2.Ace Inhibitor/ARB: Yes [   ]                                     No  [    ]                                     If No, reason:renal insuff  3.Statin:   Yes [   ]                  No  [  n ]                  If No, reason:impaired liver function  4.Ecasa:  Yes  [ y  ]                  No   [   ]                  If No, reason:   Signed: Issac Moure E 02/03/2016, 10:54 AM

## 2016-02-02 NOTE — Progress Notes (Signed)
Nutrition Follow-up  DOCUMENTATION CODES:   Not applicable  INTERVENTION:    Continue Regular diet  NEW NUTRITION DIAGNOSIS:   Increased nutrient needs related to  (post op healing) as evidenced by estimated needs, ongoing   GOAL:   Patient will meet greater than or equal to 90% of their needs, progressing  MONITOR:   PO intake, Labs, Weight trends, I & O's  ASSESSMENT:   65 y.o. Male with a history of COPD who presents to the ED with complaints of Flu like Symptoms with SOB and Cough and fevers and Chills x 6 days. He had worsening SOB for the past 2 days and Chest Tightness and Chest pain since the AM. He was evalauted in the ED and was hypoxic, to 91% and placed on supplemental NCO2. He was also found to have Pneumonia of the RUL and RLL on CTA of the Chest which was without evidence of a Pulmonary Embolism. He was also found to have an elevated troponin at 2.32. He was placed on IV antibiotic rx for CAP, and Cardiology was consulted and he was referred for admission.   Patient s/p procedure 3/20: INSERTION BILATERAL PLEURAL DRAINAGE CATHETER  Patient transferred to 2W-Cardiac PO intake better but still variable at 50-100% per flowsheets. Ensure Enlive supplement D/C'd due to elevated potassium levels. Disposition: [facility].  Diet Order:  Diet regular Room service appropriate?: Yes; Fluid consistency:: Thin  Skin:  Wound (see comment) (closed incision - chest and L/R legs)  Last BM:  3/26  Height:   Ht Readings from Last 1 Encounters:  01/30/16 5\' 9"  (1.753 m)    Weight:   Wt Readings from Last 1 Encounters:  02/01/16 147 lb 11.2 oz (66.996 kg)    Ideal Body Weight:  72.7 kg  BMI:  Body mass index is 21.8 kg/(m^2).  Estimated Nutritional Needs:   Kcal:  2000-2200  Protein:  110-120 gm  Fluid:  per MD  EDUCATION NEEDS:   No education needs identified at this time  Maureen ChattersKatie Ariann Khaimov, RD, LDN Pager #: [phone] After-Hours  Pager #: [phone]

## 2016-02-03 ENCOUNTER — Encounter (HOSPITAL_COMMUNITY): Payer: Self-pay | Admitting: *Deleted

## 2016-02-03 ENCOUNTER — Inpatient Hospital Stay (HOSPITAL_COMMUNITY)
Admission: RE | Admit: 2016-02-03 | Discharge: 2016-02-12 | DRG: 947 | Disposition: A | Discharge status: Home Health | Payer: Medicaid Other | Source: Intra-hospital | Attending: Physical Medicine & Rehabilitation | Admitting: Physical Medicine & Rehabilitation

## 2016-02-03 DIAGNOSIS — Z8679 Personal history of other diseases of the circulatory system: Secondary | ICD-10-CM | POA: Diagnosis present

## 2016-02-03 DIAGNOSIS — Z9981 Dependence on supplemental oxygen: Secondary | ICD-10-CM

## 2016-02-03 DIAGNOSIS — E871 Hypo-osmolality and hyponatremia: Secondary | ICD-10-CM | POA: Diagnosis not present

## 2016-02-03 DIAGNOSIS — Z7901 Long term (current) use of anticoagulants: Secondary | ICD-10-CM | POA: Diagnosis not present

## 2016-02-03 DIAGNOSIS — R600 Localized edema: Secondary | ICD-10-CM | POA: Diagnosis not present

## 2016-02-03 DIAGNOSIS — D62 Acute posthemorrhagic anemia: Secondary | ICD-10-CM | POA: Diagnosis present

## 2016-02-03 DIAGNOSIS — J90 Pleural effusion, not elsewhere classified: Secondary | ICD-10-CM | POA: Diagnosis present

## 2016-02-03 DIAGNOSIS — A419 Sepsis, unspecified organism: Secondary | ICD-10-CM

## 2016-02-03 DIAGNOSIS — I129 Hypertensive chronic kidney disease with stage 1 through stage 4 chronic kidney disease, or unspecified chronic kidney disease: Secondary | ICD-10-CM | POA: Diagnosis not present

## 2016-02-03 DIAGNOSIS — E440 Moderate protein-calorie malnutrition: Secondary | ICD-10-CM | POA: Diagnosis not present

## 2016-02-03 DIAGNOSIS — R0902 Hypoxemia: Secondary | ICD-10-CM

## 2016-02-03 DIAGNOSIS — D696 Thrombocytopenia, unspecified: Secondary | ICD-10-CM

## 2016-02-03 DIAGNOSIS — J449 Chronic obstructive pulmonary disease, unspecified: Secondary | ICD-10-CM

## 2016-02-03 DIAGNOSIS — I25810 Atherosclerosis of coronary artery bypass graft(s) without angina pectoris: Secondary | ICD-10-CM

## 2016-02-03 DIAGNOSIS — I2510 Atherosclerotic heart disease of native coronary artery without angina pectoris: Secondary | ICD-10-CM

## 2016-02-03 DIAGNOSIS — Z87891 Personal history of nicotine dependence: Secondary | ICD-10-CM | POA: Diagnosis not present

## 2016-02-03 DIAGNOSIS — Z951 Presence of aortocoronary bypass graft: Secondary | ICD-10-CM | POA: Diagnosis not present

## 2016-02-03 DIAGNOSIS — E875 Hyperkalemia: Secondary | ICD-10-CM

## 2016-02-03 DIAGNOSIS — R5381 Other malaise: Secondary | ICD-10-CM | POA: Diagnosis present

## 2016-02-03 DIAGNOSIS — E46 Unspecified protein-calorie malnutrition: Secondary | ICD-10-CM | POA: Diagnosis present

## 2016-02-03 DIAGNOSIS — Z79899 Other long term (current) drug therapy: Secondary | ICD-10-CM | POA: Diagnosis not present

## 2016-02-03 DIAGNOSIS — D72829 Elevated white blood cell count, unspecified: Secondary | ICD-10-CM | POA: Diagnosis present

## 2016-02-03 DIAGNOSIS — Z9889 Other specified postprocedural states: Secondary | ICD-10-CM

## 2016-02-03 DIAGNOSIS — N179 Acute kidney failure, unspecified: Secondary | ICD-10-CM

## 2016-02-03 DIAGNOSIS — I480 Paroxysmal atrial fibrillation: Secondary | ICD-10-CM | POA: Diagnosis not present

## 2016-02-03 DIAGNOSIS — I252 Old myocardial infarction: Secondary | ICD-10-CM

## 2016-02-03 DIAGNOSIS — E785 Hyperlipidemia, unspecified: Secondary | ICD-10-CM | POA: Diagnosis not present

## 2016-02-03 DIAGNOSIS — E8809 Other disorders of plasma-protein metabolism, not elsewhere classified: Secondary | ICD-10-CM | POA: Diagnosis not present

## 2016-02-03 DIAGNOSIS — N189 Chronic kidney disease, unspecified: Secondary | ICD-10-CM

## 2016-02-03 DIAGNOSIS — Z8619 Personal history of other infectious and parasitic diseases: Secondary | ICD-10-CM | POA: Diagnosis present

## 2016-02-03 DIAGNOSIS — I214 Non-ST elevation (NSTEMI) myocardial infarction: Secondary | ICD-10-CM

## 2016-02-03 DIAGNOSIS — R609 Edema, unspecified: Secondary | ICD-10-CM | POA: Diagnosis present

## 2016-02-03 LAB — PROTIME-INR
INR: 2.1 — ABNORMAL HIGH (ref 0.00–1.49)
Prothrombin Time: 23.4 seconds — ABNORMAL HIGH (ref 11.6–15.2)

## 2016-02-03 MED ORDER — TIOTROPIUM BROMIDE MONOHYDRATE 18 MCG IN CAPS
18.0000 ug | ORAL_CAPSULE | Freq: Every day | RESPIRATORY_TRACT | Status: DC
  Administered 2016-02-04 – 2016-02-12 (×8): 18 ug via RESPIRATORY_TRACT
  Filled 2016-02-03 (×2): qty 5

## 2016-02-03 MED ORDER — ASPIRIN EC 81 MG PO TBEC
81.0000 mg | DELAYED_RELEASE_TABLET | Freq: Every day | ORAL | Status: DC
  Administered 2016-02-04 – 2016-02-12 (×9): 81 mg via ORAL
  Filled 2016-02-03 (×9): qty 1

## 2016-02-03 MED ORDER — GUAIFENESIN ER 600 MG PO TB12
1200.0000 mg | ORAL_TABLET | Freq: Two times a day (BID) | ORAL | Status: DC
  Administered 2016-02-03 – 2016-02-12 (×18): 1200 mg via ORAL
  Filled 2016-02-03 (×19): qty 2

## 2016-02-03 MED ORDER — METOPROLOL TARTRATE 12.5 MG HALF TABLET
12.5000 mg | ORAL_TABLET | Freq: Two times a day (BID) | ORAL | Status: DC
  Administered 2016-02-03 – 2016-02-12 (×17): 12.5 mg via ORAL
  Filled 2016-02-03 (×18): qty 1

## 2016-02-03 MED ORDER — SODIUM CHLORIDE 0.9% FLUSH: 10.0000 mL | Freq: Two times a day (BID) | INTRAVENOUS | Status: DC

## 2016-02-03 MED ORDER — ONDANSETRON HCL 4 MG PO TABS: 4.0000 mg | ORAL_TABLET | Freq: Four times a day (QID) | ORAL | Status: DC | PRN

## 2016-02-03 MED ORDER — TRAZODONE HCL 50 MG PO TABS: 25.0000 mg | ORAL_TABLET | Freq: Every evening | ORAL | Status: DC | PRN

## 2016-02-03 MED ORDER — WARFARIN - PHARMACIST DOSING INPATIENT
Freq: Every day | Status: DC
Start: 2016-02-04 — End: 2016-02-12
  Administered 2016-02-04 – 2016-02-07 (×3)

## 2016-02-03 MED ORDER — FAMOTIDINE 20 MG PO TABS
20.0000 mg | ORAL_TABLET | Freq: Every day | ORAL | Status: DC
  Administered 2016-02-04 – 2016-02-12 (×9): 20 mg via ORAL
  Filled 2016-02-03 (×9): qty 1

## 2016-02-03 MED ORDER — ARFORMOTEROL TARTRATE 15 MCG/2ML IN NEBU
15.0000 ug | INHALATION_SOLUTION | Freq: Two times a day (BID) | RESPIRATORY_TRACT | Status: DC
  Administered 2016-02-04 – 2016-02-11 (×16): 15 ug via RESPIRATORY_TRACT
  Filled 2016-02-03 (×13): qty 2

## 2016-02-03 MED ORDER — BUDESONIDE 0.5 MG/2ML IN SUSP
0.5000 mg | Freq: Two times a day (BID) | RESPIRATORY_TRACT | Status: DC
  Administered 2016-02-04 – 2016-02-12 (×17): 0.5 mg via RESPIRATORY_TRACT
  Filled 2016-02-03 (×18): qty 2

## 2016-02-03 MED ORDER — OXYCODONE HCL 5 MG PO TABS
5.0000 mg | ORAL_TABLET | ORAL | Status: DC | PRN
  Administered 2016-02-03 – 2016-02-12 (×11): 10 mg via ORAL
  Filled 2016-02-03 (×11): qty 2

## 2016-02-03 MED ORDER — SODIUM CHLORIDE 0.9% FLUSH: 10.0000 mL | INTRAVENOUS | Status: DC | PRN

## 2016-02-03 MED ORDER — ONDANSETRON HCL 4 MG/2ML IJ SOLN: 4.0000 mg | Freq: Four times a day (QID) | INTRAMUSCULAR | Status: DC | PRN

## 2016-02-03 MED ORDER — BISACODYL 10 MG RE SUPP: 10.0000 mg | Freq: Every day | RECTAL | Status: DC | PRN

## 2016-02-03 MED ORDER — WARFARIN SODIUM 2.5 MG PO TABS
2.5000 mg | ORAL_TABLET | ORAL | Status: AC
  Administered 2016-02-03: 2.5 mg via ORAL
  Filled 2016-02-03: qty 1

## 2016-02-03 MED ORDER — ALUM & MAG HYDROXIDE-SIMETH 200-200-20 MG/5ML PO SUSP: 30.0000 mL | ORAL | Status: DC | PRN

## 2016-02-03 MED ORDER — ACETAMINOPHEN 325 MG PO TABS: 650.0000 mg | ORAL_TABLET | Freq: Four times a day (QID) | ORAL | Status: DC | PRN

## 2016-02-03 MED ORDER — TRAMADOL HCL 50 MG PO TABS
50.0000 mg | ORAL_TABLET | Freq: Four times a day (QID) | ORAL | Status: DC | PRN
Start: 2016-02-03 — End: 2016-02-12

## 2016-02-03 MED ORDER — DIGOXIN 125 MCG PO TABS
0.2500 mg | ORAL_TABLET | Freq: Every day | ORAL | Status: DC
  Administered 2016-02-04 – 2016-02-12 (×9): 0.25 mg via ORAL
  Filled 2016-02-03 (×9): qty 2

## 2016-02-03 MED ORDER — GUAIFENESIN-DM 100-10 MG/5ML PO SYRP: 5.0000 mL | ORAL_SOLUTION | Freq: Four times a day (QID) | ORAL | Status: DC | PRN

## 2016-02-03 MED ORDER — SENNOSIDES-DOCUSATE SODIUM 8.6-50 MG PO TABS: 2.0000 | ORAL_TABLET | Freq: Every evening | ORAL | Status: DC | PRN

## 2016-02-03 MED ORDER — FLEET ENEMA 7-19 GM/118ML RE ENEM: 1.0000 | ENEMA | Freq: Once | RECTAL | Status: DC | PRN

## 2016-02-03 MED ORDER — WARFARIN SODIUM 3 MG PO TABS: 3.0000 mg | ORAL_TABLET | Freq: Once | ORAL | Status: DC

## 2016-02-03 NOTE — Progress Notes (Signed)
I met with pt at bedside and he is in agreement to admit to inpt rehab today and bed is available. RN CM is aware. I will make arrangements to admit today. [phone]

## 2016-02-03 NOTE — Progress Notes (Signed)
Physical Medicine and Rehabilitation Consult Reason for Consult: Debilitation/respiratory failure/NSTEMI Referring Physician: Dr. Laneta SimmersBartle   HPI: Craig [name] is a 65 y.o. right handed male with history of COPD/tobacco and alcohol abuse. By chart review patient [place] with [place] in [place]. Independent prior to admission and retired. Plans to stay with his sister on discharge. Presented 12/26/2015 with flulike symptoms including shortness of breath, cough and low-grade fever as well as chills 2 days. Nonspecific chest tightness. He was evaluated in the ED found to be hypoxic placed on supplemental oxygen. Chest x-ray showed right upper lobe pneumonia. CT angiogram of the chest no pulmonary emboli. Troponin elevated 2.32. Placed on broad-spectrum antibiotics. Cardiology services consulted. EKG demonstrated variable sinus tachycardia and ectopic atrial tachycardia, inferior Q waves, transient inferior ST-T wave abnormalities. Echocardiogram with ejection fraction of 40%. Akinesis of the mid-apical inferior lateral myocardium. Critical care pulmonary services was consulted 12/27/2015 for increasing shortness of breath patient ultimately intubated. Cardiac catheterization 12/29/2015 showed 50% ostial LM stenosis and three-vessel coronary artery disease with severe aortic stenosis. Cardiothoracic surgery Dr. Laneta SimmersBartle consulted with patient stabilized and underwent CABG with aortic valve replacement 01/07/2016. Follow-up echocardiogram 01/11/2016 showed LV and RV systolic function normal. Patient currently maintained on Coumadin therapy. He was extubated and diet has been slowly advanced to regular. Physical therapy has been initiated with sternal precautions with and progressive gains noting debilitation related to multiple medical issues. M.D. has requested physical medicine rehabilitation consult.   Review of Systems  Constitutional: Positive for fever, chills and malaise/fatigue.  HENT:  Negative for hearing loss.  Eyes: Negative for blurred vision and double vision.  Respiratory: Positive for cough and shortness of breath.  Cardiovascular: Positive for chest pain and leg swelling.  Gastrointestinal: Positive for constipation. Negative for nausea and vomiting.  Genitourinary: Negative for dysuria and hematuria.  Musculoskeletal: Positive for myalgias and joint pain.  Skin: Negative for rash.  Neurological: Positive for weakness. Negative for seizures and headaches.  All other systems reviewed and are negative.  Past Medical History  Diagnosis Date  . COPD (chronic obstructive pulmonary disease) (HCC)   . Hypertension   . Hyperlipidemia   . Alcohol abuse     Prior  . Tobacco abuse     Prior   Past Surgical History  Procedure Laterality Date  . Ruptured disc repair  2009 or 2010  . Cardiac catheterization N/A 12/29/2015    Procedure: Right/Left Heart Cath and Coronary Angiography; Surgeon: Peter M SwazilandJordan, MD; [facility]; Service: Cardiovascular; Laterality: N/A;  . Coronary artery bypass graft N/A 01/07/2016    Procedure: CORONARY ARTERY BYPASS GRAFTING (CABG), ON PUMP, TIMES FOUR, USING LEFT INTERNAL MAMMARY ARTERY, BILATERAL GREATER SAPHENOUS VEINS HARVESTED ENDOSCOPICALLY; Surgeon: Alleen BorneBryan K Bartle, MD; Location: [facility] OR; Service: Open Heart Surgery; Laterality: N/A; LIMA to LAD, SVG to OM, SVG SEQUENTIALLY to PDA and PLB  . Tee without cardioversion N/A 01/07/2016    Procedure: TRANSESOPHAGEAL ECHOCARDIOGRAM (TEE); Surgeon: Alleen BorneBryan K Bartle, MD; [facility]; Service: Open Heart Surgery; Laterality: N/A;  . Thoracic aortic aneurysm repair N/A 01/07/2016    Procedure: CIRC ARREST AND REPLACEMENT OF ASCENDING THORACIC ANEURYSM; Surgeon: Alleen BorneBryan K Bartle, MD; Location: [facility] OR; Service: Open Heart Surgery; Laterality: N/A;   Family History  Problem Relation Age of Onset  .  Diabetes Father    Social History:  reports that he has quit smoking. He has never used smokeless tobacco. He reports that he does not drink alcohol or use illicit  drugs. Allergies: No Known Allergies No prescriptions prior to admission    Home: Home Living Family/patient expects to be discharged to:: Private residence Living Arrangements: Children, Other relatives (plans to stay with sister at d/c.) Available Help at Discharge: Family, Available 24 hours/day Type of Home: House Home Access: Stairs to enter [employer] of Steps: 3? Entrance Stairs-Rails: Right Home Layout: One level Home Equipment: None  Functional History: Prior Function Level of Independence: Independent Functional Status:  Mobility: Bed Mobility General bed mobility comments: Sitting in chair upon PT arrival.  Transfers Overall transfer level: Needs assistance Equipment used: (w/c.) Transfers: Sit to/from Stand Sit to Stand: Mod assist, +2 physical assistance Stand pivot transfers: Min assist General transfer comment: Mod A of 2 to boost from chair with cues for hand placement/technique and use of heart pillow. Dizziness in standing. Sp02 remained >90% on 4L/min 02 [place].  Ambulation/Gait Ambulation/Gait assistance: Min assist, +2 safety/equipment Ambulation Distance (Feet): 8 Feet (+ 15') Assistive device: (pushed w/c) Gait Pattern/deviations: Step-through pattern, Decreased stride length, Trunk flexed General Gait Details: Slow, unsteady gait with Min A for balance and w/c management. Fatigues. 2/4 DOE. + dizziness. BP and SP02 stable. Gait velocity interpretation: Below normal speed for age/gender    ADL:    Cognition: Cognition Overall Cognitive Status: Within Functional Limits for tasks assessed Orientation Level: Oriented X4 Cognition Arousal/Alertness: Awake/alert Behavior During Therapy: WFL for tasks assessed/performed Overall Cognitive Status: Within Functional Limits  for tasks assessed  Blood pressure 94/83, pulse 95, temperature 97.5 F (36.4 C), temperature source Oral, resp. rate 17, height 5\' 9"  (1.753 m), weight 71.8 kg (158 lb 4.6 oz), SpO2 95 %. Physical Exam  Vitals reviewed. Constitutional: He is oriented to person, place, and time. He appears well-developed.  Appears fatigued  HENT:  Head: Normocephalic and atraumatic.  Eyes: EOM are normal.  Neck: Normal range of motion. Neck supple. No thyromegaly present.  Cardiovascular:  Cardiac rate controlled  Respiratory: No respiratory distress. He has no wheezes.  Decreased breath sounds at the bases  GI: Soft. Bowel sounds are normal. He exhibits no distension.  Musculoskeletal:  Bilateral LE edema.  Neurological: He is alert and oriented to person, place, and time.  Skin: No rash noted. There is erythema.  Chest incisions clean and dry. Donor sites generally intact/tender, incision near right knee with sl drainage  Psychiatric:  Flat, generally pleasant and cooperative     Lab Results Last 24 Hours    Results for orders placed or performed during the hospital encounter of 12/26/15 (from the past 24 hour(s))  I-STAT, chem 8 Status: Abnormal   Collection Time: 01/21/16 8:49 AM  Result Value Ref Range   Sodium 138 135 - 145 mmol/L   Potassium 5.5 (H) 3.5 - 5.1 mmol/L   Chloride 90 (L) 101 - 111 mmol/L   BUN 47 (H) 6 - 20 mg/dL   Creatinine, Ser 4.09 (H) 0.61 - 1.24 mg/dL   Glucose, Bld 811 (H) 65 - 99 mg/dL   Calcium, Ion 9.14 7.82 - 1.30 mmol/L   TCO2 41 0 - 100 mmol/L   Hemoglobin 14.3 13.0 - 17.0 g/dL   HCT 95.6 21.3 - 08.6 %  Basic metabolic panel Status: Abnormal   Collection Time: 01/21/16 12:57 PM  Result Value Ref Range   Sodium 139 135 - 145 mmol/L   Potassium 4.4 3.5 - 5.1 mmol/L   Chloride 93 (L) 101 - 111 mmol/L   CO2 39 (H) 22 - 32 mmol/L  Glucose, Bld 146 (H) 65 - 99 mg/dL   BUN  47 (H) 6 - 20 mg/dL   Creatinine, Ser 1.61 (H) 0.61 - 1.24 mg/dL   Calcium 8.0 (L) 8.9 - 10.3 mg/dL   GFR calc non Af Amer 55 (L) >60 mL/min   GFR calc Af Amer >60 >60 mL/min   Anion gap 7 5 - 15  Protime-INR Status: Abnormal   Collection Time: 01/22/16 5:13 AM  Result Value Ref Range   Prothrombin Time 23.0 (H) 11.6 - 15.2 seconds   INR 2.06 (H) 0.00 - 1.49  Magnesium Status: None   Collection Time: 01/22/16 5:13 AM  Result Value Ref Range   Magnesium 1.9 1.7 - 2.4 mg/dL  Phosphorus Status: None   Collection Time: 01/22/16 5:13 AM  Result Value Ref Range   Phosphorus 3.1 2.5 - 4.6 mg/dL  Comprehensive metabolic panel Status: Abnormal   Collection Time: 01/22/16 5:13 AM  Result Value Ref Range   Sodium 138 135 - 145 mmol/L   Potassium 5.2 (H) 3.5 - 5.1 mmol/L   Chloride 91 (L) 101 - 111 mmol/L   CO2 36 (H) 22 - 32 mmol/L   Glucose, Bld 132 (H) 65 - 99 mg/dL   BUN 49 (H) 6 - 20 mg/dL   Creatinine, Ser 0.96 (H) 0.61 - 1.24 mg/dL   Calcium 8.4 (L) 8.9 - 10.3 mg/dL   Total Protein 4.8 (L) 6.5 - 8.1 g/dL   Albumin 2.2 (L) 3.5 - 5.0 g/dL   AST 67 (H) 15 - 41 U/L   ALT 145 (H) 17 - 63 U/L   Alkaline Phosphatase 94 38 - 126 U/L   Total Bilirubin 1.4 (H) 0.3 - 1.2 mg/dL   GFR calc non Af Amer 59 (L) >60 mL/min   GFR calc Af Amer >60 >60 mL/min   Anion gap 11 5 - 15  CBC Status: Abnormal   Collection Time: 01/22/16 5:13 AM  Result Value Ref Range   WBC 13.9 (H) 4.0 - 10.5 K/uL   RBC 3.72 (L) 4.22 - 5.81 MIL/uL   Hemoglobin 11.9 (L) 13.0 - 17.0 g/dL   HCT 04.5 (L) 40.9 - 81.1 %   MCV 100.8 (H) 78.0 - 100.0 fL   MCH 32.0 26.0 - 34.0 pg   MCHC 31.7 30.0 - 36.0 g/dL   RDW 91.4 (H) 78.2 - 95.6 %   Platelets 127 (L) 150 - 400 K/uL      Imaging Results (Last 48 hours)    Dg Chest Port 1  View  01/21/2016 CLINICAL DATA: Bilateral thoracentesis. EXAM: PORTABLE CHEST 1 VIEW COMPARISON: 01/21/2016 FINDINGS: Both pleural effusions are considerably improved. No pneumothorax seen. Prior median sternotomy. Interstitial accentuation in both lungs favoring the lung bases. Prosthetic aortic valve. Old healed bilateral rib fractures. Stable enlargement of the cardiopericardial silhouette IMPRESSION: 1. Prominent reduction in size of the bilateral pleural effusions. No pneumothorax seen. 2. Mild interstitial accentuation, favoring the lung bases, with cardiac enlargement, possible interstitial pulmonary edema or re-expansion edema. 3. Old bilateral rib fractures. Electronically Signed By: Gaylyn Rong M.D. On: 01/21/2016 09:50   Dg Chest Port 1 View  01/21/2016 CLINICAL DATA: Post CABG EXAM: PORTABLE CHEST 1 VIEW COMPARISON: Portable exam 0558 hours compared to 01/20/2016 FINDINGS: Rotated to the RIGHT. Enlargement of cardiac silhouette post median sternotomy. Bibasilar effusions and atelectasis. No gross infiltrate or pneumothorax. Old BILATERAL rib fractures. IMPRESSION: Enlargement of cardiac silhouette. Persistent bibasilar effusions atelectasis slightly greater on LEFT. Electronically Signed By: Ulyses Southward  M.D. On: 01/21/2016 08:00     Assessment/Plan: Diagnosis: debility related to sepsis and AS with subsequent CABG/AVR and multiple medical issues 1. Does the need for close, 24 hr/day medical supervision in concert with the patient's rehab needs make it unreasonable for this patient to be served in a less intensive setting? Yes 2. Co-Morbidities requiring supervision/potential complications: COPD, CAD, acute renal failure, pneumonia 3. Due to bladder management, bowel management, safety, skin/wound care, disease management, medication administration, pain management and patient education, does the patient require 24 hr/day rehab nursing? Yes 4. Does the patient require  coordinated care of a physician, rehab nurse, PT (1-2 hrs/day, 5 days/week) and OT (1-2 hrs/day, 5 days/week) to address physical and functional deficits in the context of the above medical diagnosis(es)? Yes Addressing deficits in the following areas: balance, endurance, locomotion, strength, transferring, bowel/bladder control, bathing, dressing, feeding, grooming, toileting and psychosocial support 5. Can the patient actively participate in an intensive therapy program of at least 3 hrs of therapy per day at least 5 days per week? Yes 6. The potential for patient to make measurable gains while on inpatient rehab is excellent 7. Anticipated functional outcomes upon discharge from inpatient rehab are modified independent and supervision with PT, modified independent and supervision with OT, n/a with SLP. 8. Estimated rehab length of stay to reach the above functional goals is: 8-12 days 9. Does the patient have adequate social supports and living environment to accommodate these discharge functional goals? Yes 10. Anticipated D/C setting: Home 11. Anticipated post D/C treatments: HH therapy and Outpatient therapy 12. Overall Rehab/Functional Prognosis: excellent  RECOMMENDATIONS: This patient's condition is appropriate for continued rehabilitative care in the following setting: CIR when medically appropriate Patient has agreed to participate in recommended program. Yes Note that insurance prior authorization may be required for reimbursement for recommended care.  Comment: Rehab Admissions Coordinator to follow up.  Thanks,  Ranelle Oyster, MD, [facility]     01/22/2016       Revision History     Date/Time User Provider Type Action   01/22/2016 11:22 AM Ranelle Oyster, MD Physician Sign   01/22/2016 6:26 AM Charlton Amor, PA-C Physician Assistant Pend   View Details Report       Routing History     Date/Time From To Method   01/22/2016 11:22 AM Ranelle Oyster, MD  Rose Fillers, PA-C Fax

## 2016-02-03 NOTE — H&P (View-Only) (Signed)
Physical Medicine and Rehabilitation Admission H&P    Chief Complaint  Patient presents with  . Debility due to sepsis and NSTEMI with subsequent CABG and replacement of ascending thoracic aneurysm.    HPI: Craig FanningRobert Presutti is a 65 year old male with history of HTN, ETOH abuse, COPD who was admitted on 12/26/15 with CAP and elevated cardiac enzymes. He was started on IV antibiotics as well as IV heparin for NSTEMI.  He developed ARDS requiring intubation on 02/19 and CT chest done revealing diffuse emphysema with ascending aortic aneurysm 5.2 cm.  Patient self extubated on 02/20 and treated with IV steroids for acute exacerbation of COPD with component of pulmonary edema.  2D echo with EF 35- 40%, with moderate concentric LVH, akinesis and heavily calcified AV.  Cardiac cath done revealing left main and 3V CAD. He developed PAF and was started on amiodarone for treatment.  He underwent CABG X 4 and replacement of ascending thoracic aneurysm on 03/02 by Dr. Laneta SimmersBartle.  Post op had issues with acute on chronic renal failure, Abn LFTs due to shocked liver as well as hypotension with respiratory failure requiring reintubation on 03/06-3/7 due to septic shock.  He continued to have issues with hypoxia requiring BIPAP and underwent right thoracocentesis of 1400 cc on 3/11 and left thoracocentesis of 1300 cc on 3/12.   He has had issues with anxiety and deconditioning. He developed recurrent bilateral effusions requiring bilateral thoracocentesis with placement of pleurex catheters on 03/20. Post procedure with hypotension requiring pressors question due to sepsis and was started on IV Maxipime and Vancomycin with improvement. He remains afebrile with improvement in WBC.  Afib controlled on metoprolol and digoxin. Therapy ongoing and CIR recommended for follow up therapy.   Review of Systems  Constitutional: Positive for malaise/fatigue.  HENT: Negative for hearing loss.   Eyes: Negative for blurred vision  and double vision.  Respiratory: Positive for shortness of breath and wheezing. Negative for cough.   Cardiovascular: Positive for leg swelling. Negative for chest pain and palpitations.  Gastrointestinal: Negative for heartburn, nausea, abdominal pain and constipation.  Genitourinary: Negative for dysuria, urgency and frequency.  Musculoskeletal: Negative for myalgias, back pain and joint pain.  Skin: Negative for itching and rash.  Neurological: Negative for dizziness, tingling and headaches.  Psychiatric/Behavioral: Negative for memory loss. The patient does not have insomnia.   All other systems reviewed and are negative.   Past Medical History  Diagnosis Date  . COPD (chronic obstructive pulmonary disease) (HCC)   . Hypertension   . Hyperlipidemia   . Alcohol abuse     Prior  . Tobacco abuse     Prior    Past Surgical History  Procedure Laterality Date  . Ruptured disc repair  2009 or 2010  . Cardiac catheterization N/A 12/29/2015    Procedure: Right/Left Heart Cath and Coronary Angiography;  Surgeon: Peter M SwazilandJordan, MD;  [facility];  Service: Cardiovascular;  Laterality: N/A;  . Coronary artery bypass graft N/A 01/07/2016    Procedure: CORONARY ARTERY BYPASS GRAFTING (CABG), ON PUMP, TIMES FOUR, USING LEFT INTERNAL MAMMARY ARTERY, BILATERAL GREATER SAPHENOUS VEINS HARVESTED ENDOSCOPICALLY;  Surgeon: Alleen BorneBryan K Bartle, MD;  Location: [facility] OR;  Service: Open Heart Surgery;  Laterality: N/A;  LIMA to LAD, SVG to OM, SVG SEQUENTIALLY to PDA and PLB  . Tee without cardioversion N/A 01/07/2016    Procedure: TRANSESOPHAGEAL ECHOCARDIOGRAM (TEE);  Surgeon: Alleen BorneBryan K Bartle, MD;  [facility];  Service:  Open Heart Surgery;  Laterality: N/A;  . Thoracic aortic aneurysm repair N/A 01/07/2016    Procedure: CIRC ARREST AND REPLACEMENT OF ASCENDING THORACIC  ANEURYSM;  Surgeon: Alleen Borne, MD;  Location: [facility] OR;  Service: Open Heart Surgery;  Laterality: N/A;  . Chest tube  insertion Bilateral 01/25/2016    Procedure: INSERTION Bilateral PLEURAL DRAINAGE CATHETER;  Surgeon: Alleen Borne, MD;  Location: [facility] OR;  Service: Thoracic;  Laterality: Bilateral;    Family History  Problem Relation Age of Onset  . Diabetes Father     Social History:  Lives with 24 year old son. Plans on going home with his sister. 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 He reports that he has quit smoking 7 years ago--150 pack year history. He has never used smokeless tobacco. He reports that he does not drink alcohol--quit 2004 or use illicit drugs.    Allergies: No Known Allergies    No prescriptions prior to admission    Home: Home Living Family/patient expects to be discharged to:: Private residence Living Arrangements: Children (pt lived with his 44 yo son, Jeannett Senior) Available Help at Discharge: Family, Available 24 hours/day (pt to go home withhis ister, [name] for 24/7 supervision) Type of Home: House Home Access: Stairs to enter [employer] of Steps: 3? Entrance Stairs-Rails: Right Home Layout: One level Bathroom Shower/Tub: Engineer, manufacturing systems: Standard Bathroom Accessibility: Yes Home Equipment: None  Lives With: Son   Functional History: Prior Function Level of Independence: Independent  Functional Status:  Mobility: Bed Mobility Overal bed mobility: Needs Assistance Bed Mobility: Sit to Supine Rolling: Min guard Sidelying to sit: Min guard, HOB elevated Supine to sit: Mod assist, HOB elevated Sit to supine: Min assist, HOB elevated General bed mobility comments: Assist to bring LLE into bed, HOB elevated. CUes to adhere to sternal precautions. Transfers Overall transfer level: Needs assistance Equipment used: 4-wheeled walker Transfers: Sit to/from Stand Sit to Stand: Mod assist, +2 physical assistance Stand pivot transfers: Min assist General transfer comment: Not able to stand with assist of 1 from low recliner  due to weakness in BLEs despite cues for momentum and technique. Pt already has it in his mind he cannot do it without assist of 2. Able to stand with second person holding heart pillow. Stood from rollator seat. Cues not to use UEs. Ambulation/Gait Ambulation/Gait assistance: Min guard Ambulation Distance (Feet): 50 Feet (x2 bouts) Assistive device: 4-wheeled walker Gait Pattern/deviations: Step-through pattern, Decreased stride length, Trunk flexed General Gait Details: Slow, steady gait with 2/4 DOE. Sp02 90% on 3L/min 02. 1 seated rest break. Instructed pt how to use rollator brakes and seat. Self limited ambulation distance. Gait velocity interpretation: Below normal speed for age/gender    ADL:    Cognition: Cognition Overall Cognitive Status: Within Functional Limits for tasks assessed Orientation Level: Oriented X4 Cognition Arousal/Alertness: Awake/alert Behavior During Therapy: WFL for tasks assessed/performed Overall Cognitive Status: Within Functional Limits for tasks assessed    Blood pressure 100/75, pulse 79, temperature 98 F (36.7 C), temperature source Oral, resp. rate 18, height 5\' 9"  (1.753 m), weight 75.1 kg (165 lb 9.1 oz), SpO2 92 %. Physical Exam  Nursing note and vitals reviewed. Constitutional: He is oriented to person, place, and time. He appears well-developed and well-nourished. Nasal cannula in place.  HENT:  Head: Normocephalic and atraumatic.  Mouth/Throat: Oropharynx is clear and moist.  Eyes: Conjunctivae and EOM are normal. Pupils are equal, round, and reactive to light. No scleral icterus.  Neck: Normal range of motion. Neck supple.  Cardiovascular: An irregularly irregular rhythm present. Tachycardia present.   Respiratory: Accessory muscle usage (just walked 50 feet. ) present. No respiratory distress. He has rhonchi in the right lower field and the left lower field. He exhibits no tenderness.  GI: Soft. Bowel sounds are normal. He exhibits no  distension. There is no tenderness.  Musculoskeletal: He exhibits edema.  2-3 + pitting edema bilateral upper thighs right > left.  2+ pedal edema.  Diffuse ecchymosis bilateral shins down to ankles.   Neurological: He is alert and oriented to person, place, and time.  Speech clear.  Easily agitated and needs ego support and encouragement.  He was able to follow simple commands without difficulty. Motor: 4/5 grossly throughout  Skin: Skin is warm and dry.  Midline chest incision clean, dry and intact. Torso with dry dressing covering tubing bilaterally. Pacer sutures in place.   Psychiatric: His speech is normal. His affect is angry. He is agitated. Cognition and memory are not impaired.    Results for orders placed or performed during the hospital encounter of 12/26/15 (from the past 48 hour(s))  Protime-INR     Status: Abnormal   Collection Time: 02/02/16  4:49 AM  Result Value Ref Range   Prothrombin Time 20.3 (H) 11.6 - 15.2 seconds   INR 1.74 (H) 0.00 - 1.49  Protime-INR     Status: Abnormal   Collection Time: 02/03/16  6:20 AM  Result Value Ref Range   Prothrombin Time 23.4 (H) 11.6 - 15.2 seconds   INR 2.10 (H) 0.00 - 1.49   No results found.     Medical Problem List and Plan: 1.  Debility secondary to sepsis and NSTEMI with subsequent CABG/ascending aneurysm repair.  2.  DVT Prophylaxis/Anticoagulation: Pharmaceutical: Coumadin--INR therapeutic today.  3. Pain Management:  Continue oxycodone prn.  4. Mood: Team to provide ego support. LCSW to follow for evaluation and support.  5. Neuropsych: This patient is capable of making decisions on his own behalf. 6. Skin/Wound Care: Monitor nutrition and hydration status.  7. Fluids/Electrolytes/Nutrition: Monitor I/O. Check lytes in am.  8. CAD s/p CABG x 4:  To continue sternal precautions. On ASA, BB--no statin due to shocked liver.   9. A fib: Monitor heart rate bid and with activity. Continue coumadin, metoprolol and  digoxin.  10 COPD: On spiriva, Pulmicort nebs and Mucinex bid.  Oxygen dependent at this time.  11. Leucocytosis: Monitor for signs of infection. Completed antibiotics 03/28.  12. Hyponatremia: Likely dilutional. Will check labs in am. 13.  Thrombocytopenia:  Continue to monitor platelets as on downward trend. Has been off heparin since surgery. Recheck LFTs in am.  14. ABLA:  Stable. Recheck CBC in am.   Post Admission Physician Evaluation: 1. Functional deficits secondary  to to sepsis and NSTEMI with subsequent CABG/ascending aneurysm repair. 2. Patient is admitted to receive collaborative, interdisciplinary care between the physiatrist, rehab nursing staff, and therapy team. 3. Patient's level of medical complexity and substantial therapy needs in context of that medical necessity cannot be provided at a lesser intensity of care such as a SNF. 4. Patient has experienced substantial functional loss from his/her baseline which was documented above under the "Functional History" and "Functional Status" headings.  Judging by the patient's diagnosis, physical exam, and functional history, the patient has potential for functional progress which will result in measurable gains while on inpatient rehab.  These gains will be of substantial and practical use upon  discharge  in facilitating mobility and self-care at the household level. 5. Physiatrist will provide 24 hour management of medical needs as well as oversight of the therapy plan/treatment and provide guidance as appropriate regarding the interaction of the two. 6. 24 hour rehab nursing will assist with bladder management, bowel management, safety, skin/wound care, disease management, medication administration, pain management and patient and help integrate therapy concepts, techniques,education, etc. 7. PT will assess and treat for/with: Lower extremity strength, range of motion, stamina, balance, functional mobility, safety, adaptive techniques  and equipment, woundcare, coping skills, pain control, education.   Goals are: Mod I/Supervision. 8. OT will assess and treat for/with: ADL's, functional mobility, safety, upper extremity strength, adaptive techniques and equipment, wound mgt, ego support, and community reintegration.   Goals are: Mod I/Supervision. Therapy may not proceed with showering this patient. 9. Case Management and Social Worker will assess and treat for psychological issues and discharge planning. 10. Team conference will be held weekly to assess progress toward goals and to determine barriers to discharge. 11. Patient will receive at least 3 hours of therapy per day at least 5 days per week. 12. ELOS: 8-12 days.       13. Prognosis:  excellent  Maryla Morrow, MD 02/03/2016

## 2016-02-03 NOTE — Progress Notes (Signed)
      [address]       [place] [id number]             [phone]      9 Days Post-Op Procedure(s) (LRB): INSERTION Bilateral PLEURAL DRAINAGE CATHETER (Bilateral) Subjective: conts to feel better  Objective: Vital signs in last 24 hours: Temp:  [97.3 F (36.3 C)-98 F (36.7 C)] 98 F (36.7 C) (03/29 0548) Pulse Rate:  [74-88] 74 (03/29 0548) Cardiac Rhythm:  [-] Atrial fibrillation (03/28 1918) Resp:  [18-118] 18 (03/29 0548) BP: (94-122)/(70-81) 116/70 mmHg (03/29 0548) SpO2:  [92 %-100 %] 92 % (03/29 0812) Weight:  [165 lb 9.1 oz (75.1 kg)-166 lb (75.297 kg)] 165 lb 9.1 oz (75.1 kg) (03/29 0548)  Hemodynamic parameters for last 24 hours:    Intake/Output from previous day: 03/28 0701 - 03/29 0700 In: -  Out: 1575 [Urine:600; Chest Tube:975] Intake/Output this shift:    General appearance: alert, cooperative and no distress Heart: irregularly irregular rhythm Lungs: fair air exchange throughout, no wheeze or ronchi Abdomen: benign Extremities: + LE edema Wound: incis healing well  Lab Results:  Recent Labs  02/01/16 0415  WBC 11.2*  HGB 10.0*  HCT 32.0*  PLT 135*   BMET:  Recent Labs  02/01/16 0415  NA 132*  K 4.7  CL 96*  CO2 30  GLUCOSE 95  BUN 24*  CREATININE 0.99  CALCIUM 8.0*    PT/INR:  Recent Labs  02/03/16 0620  LABPROT 23.4*  INR 2.10*   ABG    Component Value Date/Time   PHART 7.366 01/12/2016 1529   HCO3 33.4* 01/12/2016 1529   TCO2 41 01/21/2016 0849   ACIDBASEDEF 3.0* 01/11/2016 0728   O2SAT 53.2 01/28/2016 0622   CBG (last 3)  No results for input(s): GLUCAP in the last 72 hours.  Meds Scheduled Meds: . antiseptic oral rinse  7 mL Mouth Rinse BID  . arformoterol  15 mcg Nebulization BID  . aspirin EC  81 mg Oral Daily  . budesonide (PULMICORT) nebulizer solution  0.5 mg Nebulization BID  . digoxin  0.25 mg Oral Daily  . famotidine  20 mg Oral Daily  . guaiFENesin  1,200 mg Oral BID  .  metoprolol tartrate  12.5 mg Oral BID  . moving right along book   Does not apply Once  . sodium chloride flush  10-40 mL Intracatheter Q12H  . sodium chloride flush  3 mL Intravenous Q12H  . tiotropium  18 mcg Inhalation Daily  . Warfarin - Physician Dosing Inpatient   Does not apply q1800   Continuous Infusions:  PRN Meds:.sodium chloride, acetaminophen, ondansetron **OR** ondansetron (ZOFRAN) IV, oxyCODONE, sodium chloride flush, sodium chloride flush, traMADol  Xrays No results found.  Assessment/Plan: S/P Procedure(s) (LRB): INSERTION Bilateral PLEURAL DRAINAGE CATHETER (Bilateral)  1 steady progress  2 cont coumadin at 3 mg 3 CIR soon  LOS: 39 days    Aamina Skiff E 02/03/2016

## 2016-02-03 NOTE — Progress Notes (Signed)
PMR Admission Coordinator Pre-Admission Assessment  Patient: Craig [name] is an 65 y.o., male MRN: [id number] DOB: Nov 01, 1951 Height:  (175.3 cm) Weight: 75.1 kg (165 lb 9.1 oz)  Insurance Information HMO: PPO: PCP: IPA: 80/20: OTHER:  PRIMARY: uninsured   Medicaid Application Date: Case Manager:  Disability Application Date: Case Worker:  Medicaid and disability applications begun  Emergency Contact Information Contact Information    Name Relation Home Work Mobile   [place] Sister   [phone]   [place], Rafalski [phone]       Current Medical History  Patient Admitting Diagnosis: debility related to sepsis and AS with subsequent CABG/AVR  History of Present Illness: Craig [name] is a 65 y.o. right handed male with history of COPD/tobacco and alcohol abuse. Presented 12/26/2015 with flulike symptoms including shortness of breath, cough and low-grade fever as well as chills 2 days. Nonspecific chest tightness. He was evaluated in the ED found to be hypoxic placed on supplemental oxygen. Chest x-ray showed right upper lobe pneumonia. CT angiogram of the chest no pulmonary emboli. Troponin elevated 2.32. Placed on broad-spectrum antibiotics. Cardiology services consulted. EKG demonstrated variable sinus tachycardia and ectopic atrial tachycardia, inferior Q waves, transient inferior ST-T wave abnormalities. Echocardiogram with ejection fraction of 40%. Akinesis of the mid-apical inferior lateral myocardium. Critical care pulmonary services was consulted 12/27/2015 for increasing shortness of breath patient ultimately intubated. Cardiac catheterization 12/29/2015 showed 50% ostial LM stenosis and three-vessel coronary artery disease with severe aortic stenosis.  Cardiothoracic surgery Dr. Laneta Simmers consulted with patient stabilized and underwent CABG with aortic valve replacement 01/07/2016. Follow-up echocardiogram 01/11/2016 showed LV and RV systolic function normal. Patient currently maintained on Coumadin therapy. He was extubated and diet has been slowly advanced to regular.   He was placed on broad-spectrum antibiotics. He developed some large pleural effusions and ultimately it was felt that he would best be served by placement of Pleurx catheters which was done bilaterally. He continues to have significant output during the drainage procedures daily. O2 at 2 liters nasal cannula.  Medical History  Past Medical History  Diagnosis Date  . COPD (chronic obstructive pulmonary disease) (HCC)   . Hypertension   . Hyperlipidemia   . Alcohol abuse     Prior  . Tobacco abuse     Prior    Family History  family history includes Diabetes in his father.  Prior Rehab/Hospitalizations:  Has the patient had major surgery during 100 days prior to admission? No  Current Medications   Current facility-administered medications:  . 0.9 % sodium chloride infusion, 250 mL, Intravenous, PRN, Alleen Borne, MD, 250 mL at 02/03/16 0929 . acetaminophen (TYLENOL) tablet 650 mg, 650 mg, Oral, Q6H PRN, Alleen Borne, MD . antiseptic oral rinse (CPC / CETYLPYRIDINIUM CHLORIDE 0.05%) solution 7 mL, 7 mL, Mouth Rinse, BID, Alleen Borne, MD, 7 mL at 02/03/16 0934 . arformoterol (BROVANA) nebulizer solution 15 mcg, 15 mcg, Nebulization, BID, Coralyn Helling, MD, 15 mcg at 02/03/16 0811 . aspirin EC tablet 81 mg, 81 mg, Oral, Daily, Alleen Borne, MD, 81 mg at 02/03/16 0932 . budesonide (PULMICORT) nebulizer solution 0.5 mg, 0.5 mg, Nebulization, BID, Coralyn Helling, MD, 0.5 mg at 02/03/16 0812 . digoxin (LANOXIN) tablet 0.25 mg, 0.25 mg, Oral, Daily, Purcell Nails, MD, 0.25 mg at 02/03/16 0932 . famotidine (PEPCID) tablet 20 mg, 20  mg, Oral, Daily, Alleen Borne, MD, 20 mg at 02/03/16 0932 . guaiFENesin (MUCINEX) 12 hr tablet 1,200 mg, 1,200 mg,  Oral, BID, Alyson Reedy, MD, 1,200 mg at 02/03/16 0932 . metoprolol tartrate (LOPRESSOR) tablet 12.5 mg, 12.5 mg, Oral, BID, Alleen Borne, MD, 12.5 mg at 02/03/16 0933 . moving right along book, , Does not apply, Once, Alleen Borne, MD . ondansetron (ZOFRAN) tablet 4 mg, 4 mg, Oral, Q6H PRN **OR** ondansetron (ZOFRAN) injection 4 mg, 4 mg, Intravenous, Q6H PRN, Alleen Borne, MD . oxyCODONE (Oxy IR/ROXICODONE) immediate release tablet 5-10 mg, 5-10 mg, Oral, Q3H PRN, Alleen Borne, MD, 10 mg at 02/02/16 1652 . sodium chloride flush (NS) 0.9 % injection 10-40 mL, 10-40 mL, Intracatheter, Q12H, Alleen Borne, MD, 10 mL at 01/31/16 1202 . sodium chloride flush (NS) 0.9 % injection 10-40 mL, 10-40 mL, Intracatheter, PRN, Alleen Borne, MD, 10 mL at 02/03/16 0931 . sodium chloride flush (NS) 0.9 % injection 3 mL, 3 mL, Intravenous, Q12H, Alleen Borne, MD, 3 mL at 02/01/16 0951 . sodium chloride flush (NS) 0.9 % injection 3 mL, 3 mL, Intravenous, PRN, Alleen Borne, MD . tiotropium [facility]) inhalation capsule 18 mcg, 18 mcg, Inhalation, Daily, Rolly Salter, MD, 18 mcg at 02/03/16 0810 . traMADol (ULTRAM) tablet 50-100 mg, 50-100 mg, Oral, Q4H PRN, Alleen Borne, MD, 100 mg at 02/01/16 1513 . warfarin (COUMADIN) tablet 3 mg, 3 mg, Oral, ONCE-1800, Wayne E Gold, PA-C . Warfarin - Physician Dosing Inpatient, , Does not apply, q1800, Alleen Borne, MD  Patients Current Diet: Diet regular Room service appropriate?: Yes; Fluid consistency:: Thin  Precautions / Restrictions Precautions Precautions: Fall, Sternal Restrictions Weight Bearing Restrictions: Yes (sternal precautions) Other Position/Activity Restrictions: sternal precautions   Has the patient had 2 or more falls or a fall with injury in the past year?No  Prior Activity Level Community (5-7x/wk):  independent and driving pta. 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  Home Assistive Devices / Equipment Home Assistive Devices/Equipment: None Home Equipment: None  Prior Device Use: Indicate devices/aids used by the patient prior to current illness, exacerbation or injury? None of the above  Prior Functional Level Prior Function Level of Independence: Independent  Self Care: Did the patient need help bathing, dressing, using the toilet or eating? Independent  Indoor Mobility: Did the patient need assistance with walking from room to room (with or without device)? Independent  Stairs: Did the patient need assistance with internal or external stairs (with or without device)? Independent  Functional Cognition: Did the patient need help planning regular tasks such as shopping or remembering to take medications? Independent  Current Functional Level Cognition  Overall Cognitive Status: Within Functional Limits for tasks assessed Orientation Level: Oriented X4   Extremity Assessment (includes Sensation/Coordination)  Upper Extremity Assessment: Defer to OT evaluation, Generalized weakness  Lower Extremity Assessment: Generalized weakness    ADLs       Mobility  Overal bed mobility: Needs Assistance Bed Mobility: Sit to Supine Rolling: Min guard Sidelying to sit: Min guard, HOB elevated Supine to sit: Mod assist, HOB elevated Sit to supine: Min assist, HOB elevated General bed mobility comments: Assist to bring LLE into bed, HOB elevated. CUes to adhere to sternal precautions.    Transfers  Overall transfer level: Needs assistance Equipment used: 4-wheeled walker Transfers: Sit to/from Stand Sit to Stand: Mod assist, +2 physical assistance Stand pivot transfers: Min assist General transfer comment: Not able to stand with assist of 1 from low recliner due to weakness in  BLEs despite cues for momentum and  technique. Pt already has it in his mind he cannot do it without assist of 2. Able to stand with second person holding heart pillow. Stood from rollator seat. Cues not to use UEs.    Ambulation / Gait / Stairs / Wheelchair Mobility  Ambulation/Gait Ambulation/Gait assistance: Hydrographic surveyorMin guard Ambulation Distance (Feet): 50 Feet (x2 bouts) Assistive device: 4-wheeled walker Gait Pattern/deviations: Step-through pattern, Decreased stride length, Trunk flexed General Gait Details: Slow, steady gait with 2/4 DOE. Sp02 90% on 3L/min 02. 1 seated rest break. Instructed pt how to use rollator brakes and seat. Self limited ambulation distance. Gait velocity interpretation: Below normal speed for age/gender    Posture / Balance Dynamic Sitting Balance Sitting balance - Comments: Sitting in chair without back support, fatigues quickly.  Balance Overall balance assessment: Needs assistance Sitting-balance support: Feet supported, No upper extremity supported Sitting balance-Leahy Scale: Fair Sitting balance - Comments: Sitting in chair without back support, fatigues quickly.  Standing balance support: During functional activity Standing balance-Leahy Scale: Poor Standing balance comment: Reliant on BUEs for support.     Special needs/care consideration Oxygen O2 at 2 liters nasal cannula Skin intact but with lots of bruising to surgical sitd to lower extremities  Bowel mgmt: continent LBM 02/02/16  Bladder mgmt: continent Bilateral Pleurex catheters drained daily. Dr. Laneta SimmersBartle states he would not d/c them until drainage less than 100 cc. Has been 900-1000cc daily. Pt and his sister aware likely to go home with and teaching to be done prior to d/c   Previous Home Environment Living Arrangements: Children (pt lived with his 64 yo son, Jeannett SeniorStephen) Lives With: Son Available Help at Discharge: Family, Available 24 hours/day (pt to go home withhis ister, Lupita LeashDonna for 24/7  supervision) Type of Home: House Home Layout: One level Home Access: Stairs to enter Entrance Stairs-Rails: Right Entrance Stairs-Number of Steps: 3? Bathroom Shower/Tub: Associate ProfessorTub/shower unit Bathroom Toilet: Standard Bathroom Accessibility: Yes How Accessible: Accessible via walker Home Care Services: No  Discharge Living Setting Plans for Discharge Living Setting: Lives with (comment) (Pt to go home with his sister, Lupita LeashDonna at d/c) Type of Home at Discharge: House Discharge Home Layout: One level Discharge Home Access: Stairs to enter Entrance Stairs-Rails: Right Entrance Stairs-Number of Steps: 2 very low profile steps Discharge Bathroom Shower/Tub: Tub/shower unit, Curtain Discharge Bathroom Toilet: Handicapped height Discharge Bathroom Accessibility: Yes How Accessible: Accessible via walker (rails around toliet, pull down shower head, handicapped ht b) Does the patient have any problems obtaining your medications?: Yes (Describe) (uninsured) Donna's plan is for pt to live next door to her in home that is handicapped accessible and she can provide all needed care.  Social/Family/Support Systems Patient Roles: Parent Contact Information: Betsey HolidayDonna Craig, sister. Son, Jeannett SeniorStephen is 64 years old Anticipated Caregiver: sister, Lupita LeashDonna Anticipated Caregiver's Contact Information: see above Ability/Limitations of Caregiver: Lupita LeashDonna has no limitations Caregiver Availability: 24/7 Discharge Plan Discussed with Primary Caregiver: Yes Is Caregiver In Agreement with Plan?: Yes Does Caregiver/Family have Issues with Lodging/Transportation while Pt is in Rehab?: No  Goals/Additional Needs Patient/Family Goal for Rehab: supervision with PT and OT Expected length of stay: [facility] 8-12 days Equipment Needs: Plerex catheters bilaterally drained once daily. Likely to d/c home with catheters  Special Service Needs: Pt's son, Jeannett SeniorStephen is being cared for by pt's brother Additional Information: Pt's MOm died  while pt hospitalized. Pt is aware. His sister, Lupita LeashDonna, was her caregiver Pt/Family Agrees to Admission and willing to participate: Yes Program Orientation  Provided & Reviewed with Pt/Caregiver Including Roles & Responsibilities: Yes  Decrease burden of Care through IP rehab admission: n/a  Possible need for SNF placement upon discharge:not anticipated  Patient Condition: This patient's medical and functional status has changed since the consult dated: 01/22/2016 in which the Rehabilitation Physician determined and documented that the patient's condition is appropriate for intensive rehabilitative care in an inpatient rehabilitation facility. See "History of Present Illness" (above) for medical update. Functional changes are: min to mod assist overall. Patient's medical and functional status update has been discussed with the Rehabilitation physician and patient remains appropriate for inpatient rehabilitation. Will admit to inpatient rehab today.  Preadmission Screen Completed By: Clois Dupes, 02/03/2016 10:52 AM ______________________________________________________________________  Discussed status with Dr. Allena Katz on 02/03/16 at 1052 and received telephone approval for admission today.  Admission Coordinator: Clois Dupes, time 1610 Date 02/03/2016          Cosigned by: Ankit Karis Juba, MD at 02/03/2016 10:54 AM  Revision History     Date/Time User Provider Type Action   02/03/2016 10:54 AM Ankit Karis Juba, MD Physician Cosign   02/03/2016 10:52 AM Standley Brooking, RN Rehab Admission Coordinator Sign

## 2016-02-03 NOTE — H&P (Signed)
  Physical Medicine and Rehabilitation Admission H&P    Chief Complaint  Patient presents with  . Debility due to sepsis and NSTEMI with subsequent CABG and replacement of ascending thoracic aneurysm.    HPI: Craig [name] is a 65 year old male with history of HTN, ETOH abuse, COPD who was admitted on 12/26/15 with CAP and elevated cardiac enzymes. He was started on IV antibiotics as well as IV heparin for NSTEMI.  He developed ARDS requiring intubation on 02/19 and CT chest done revealing diffuse emphysema with ascending aortic aneurysm 5.2 cm.  Patient self extubated on 02/20 and treated with IV steroids for acute exacerbation of COPD with component of pulmonary edema.  2D echo with EF 35- 40%, with moderate concentric LVH, akinesis and heavily calcified AV.  Cardiac cath done revealing left main and 3V CAD. He developed PAF and was started on amiodarone for treatment.  He underwent CABG X 4 and replacement of ascending thoracic aneurysm on 03/02 by Dr. Bartle.  Post op had issues with acute on chronic renal failure, Abn LFTs due to shocked liver as well as hypotension with respiratory failure requiring reintubation on 03/06-3/7 due to septic shock.  He continued to have issues with hypoxia requiring BIPAP and underwent right thoracocentesis of 1400 cc on 3/11 and left thoracocentesis of 1300 cc on 3/12.   He has had issues with anxiety and deconditioning. He developed recurrent bilateral effusions requiring bilateral thoracocentesis with placement of pleurex catheters on 03/20. Post procedure with hypotension requiring pressors question due to sepsis and was started on IV Maxipime and Vancomycin with improvement. He remains afebrile with improvement in WBC.  Afib controlled on metoprolol and digoxin. Therapy ongoing and CIR recommended for follow up therapy.   Review of Systems  Constitutional: Positive for malaise/fatigue.  HENT: Negative for hearing loss.   Eyes: Negative for blurred vision  and double vision.  Respiratory: Positive for shortness of breath and wheezing. Negative for cough.   Cardiovascular: Positive for leg swelling. Negative for chest pain and palpitations.  Gastrointestinal: Negative for heartburn, nausea, abdominal pain and constipation.  Genitourinary: Negative for dysuria, urgency and frequency.  Musculoskeletal: Negative for myalgias, back pain and joint pain.  Skin: Negative for itching and rash.  Neurological: Negative for dizziness, tingling and headaches.  Psychiatric/Behavioral: Negative for memory loss. The patient does not have insomnia.   All other systems reviewed and are negative.   Past Medical History  Diagnosis Date  . COPD (chronic obstructive pulmonary disease) (HCC)   . Hypertension   . Hyperlipidemia   . Alcohol abuse     Prior  . Tobacco abuse     Prior    Past Surgical History  Procedure Laterality Date  . Ruptured disc repair  2009 or 2010  . Cardiac catheterization N/A 12/29/2015    Procedure: Right/Left Heart Cath and Coronary Angiography;  Surgeon: Peter M Jordan, MD;  Location: [facility] INVASIVE [facility];  Service: Cardiovascular;  Laterality: N/A;  . Coronary artery bypass graft N/A 01/07/2016    Procedure: CORONARY ARTERY BYPASS GRAFTING (CABG), ON PUMP, TIMES FOUR, USING LEFT INTERNAL MAMMARY ARTERY, BILATERAL GREATER SAPHENOUS VEINS HARVESTED ENDOSCOPICALLY;  Surgeon: Bryan K Bartle, MD;  Location: [facility] OR;  Service: Open Heart Surgery;  Laterality: N/A;  LIMA to LAD, SVG to OM, SVG SEQUENTIALLY to PDA and PLB  . Tee without cardioversion N/A 01/07/2016    Procedure: TRANSESOPHAGEAL ECHOCARDIOGRAM (TEE);  Surgeon: Bryan K Bartle, MD;  Location: [facility] OR;  Service:   Open Heart Surgery;  Laterality: N/A;  . Thoracic aortic aneurysm repair N/A 01/07/2016    Procedure: CIRC ARREST AND REPLACEMENT OF ASCENDING THORACIC  ANEURYSM;  Surgeon: Bryan K Bartle, MD;  Location: [facility] OR;  Service: Open Heart Surgery;  Laterality: N/A;  . Chest tube  insertion Bilateral 01/25/2016    Procedure: INSERTION Bilateral PLEURAL DRAINAGE CATHETER;  Surgeon: Bryan K Bartle, MD;  Location: [facility] OR;  Service: Thoracic;  Laterality: Bilateral;    Family History  Problem Relation Age of Onset  . Diabetes Father     Social History:  Lives with 16 year old son. Plans on going home with his sister. 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 He reports that he has quit smoking 7 years ago--150 pack year history. He has never used smokeless tobacco. He reports that he does not drink alcohol--quit 2004 or use illicit drugs.    Allergies: No Known Allergies    No prescriptions prior to admission    Home: Home Living Family/patient expects to be discharged to:: Private residence Living Arrangements: Children (pt lived with his 16 yo son, Stephen) Available Help at Discharge: Family, Available 24 hours/day (pt to go home withhis ister, Donna for 24/7 supervision) Type of Home: House Home Access: Stairs to enter Entrance Stairs-Number of Steps: 3? Entrance Stairs-Rails: Right Home Layout: One level Bathroom Shower/Tub: Tub/shower unit Bathroom Toilet: Standard Bathroom Accessibility: Yes Home Equipment: None  Lives With: Son   Functional History: Prior Function Level of Independence: Independent  Functional Status:  Mobility: Bed Mobility Overal bed mobility: Needs Assistance Bed Mobility: Sit to Supine Rolling: Min guard Sidelying to sit: Min guard, HOB elevated Supine to sit: Mod assist, HOB elevated Sit to supine: Min assist, HOB elevated General bed mobility comments: Assist to bring LLE into bed, HOB elevated. CUes to adhere to sternal precautions. Transfers Overall transfer level: Needs assistance Equipment used: 4-wheeled walker Transfers: Sit to/from Stand Sit to Stand: Mod assist, +2 physical assistance Stand pivot transfers: Min assist General transfer comment: Not able to stand with assist of 1 from low recliner  due to weakness in BLEs despite cues for momentum and technique. Pt already has it in his mind he cannot do it without assist of 2. Able to stand with second person holding heart pillow. Stood from rollator seat. Cues not to use UEs. Ambulation/Gait Ambulation/Gait assistance: Min guard Ambulation Distance (Feet): 50 Feet (x2 bouts) Assistive device: 4-wheeled walker Gait Pattern/deviations: Step-through pattern, Decreased stride length, Trunk flexed General Gait Details: Slow, steady gait with 2/4 DOE. Sp02 90% on 3L/min 02. 1 seated rest break. Instructed pt how to use rollator brakes and seat. Self limited ambulation distance. Gait velocity interpretation: Below normal speed for age/gender    ADL:    Cognition: Cognition Overall Cognitive Status: Within Functional Limits for tasks assessed Orientation Level: Oriented X4 Cognition Arousal/Alertness: Awake/alert Behavior During Therapy: WFL for tasks assessed/performed Overall Cognitive Status: Within Functional Limits for tasks assessed    Blood pressure 100/75, pulse 79, temperature 98 F (36.7 C), temperature source Oral, resp. rate 18, height 5' 9" (1.753 m), weight 75.1 kg (165 lb 9.1 oz), SpO2 92 %. Physical Exam  Nursing note and vitals reviewed. Constitutional: He is oriented to person, place, and time. He appears well-developed and well-nourished. Nasal cannula in place.  HENT:  Head: Normocephalic and atraumatic.  Mouth/Throat: Oropharynx is clear and moist.  Eyes: Conjunctivae and EOM are normal. Pupils are equal, round, and reactive to light. No scleral icterus.    Neck: Normal range of motion. Neck supple.  Cardiovascular: An irregularly irregular rhythm present. Tachycardia present.   Respiratory: Accessory muscle usage (just walked 50 feet. ) present. No respiratory distress. He has rhonchi in the right lower field and the left lower field. He exhibits no tenderness.  GI: Soft. Bowel sounds are normal. He exhibits no  distension. There is no tenderness.  Musculoskeletal: He exhibits edema.  2-3 + pitting edema bilateral upper thighs right > left.  2+ pedal edema.  Diffuse ecchymosis bilateral shins down to ankles.   Neurological: He is alert and oriented to person, place, and time.  Speech clear.  Easily agitated and needs ego support and encouragement.  He was able to follow simple commands without difficulty. Motor: 4/5 grossly throughout  Skin: Skin is warm and dry.  Midline chest incision clean, dry and intact. Torso with dry dressing covering tubing bilaterally. Pacer sutures in place.   Psychiatric: His speech is normal. His affect is angry. He is agitated. Cognition and memory are not impaired.    Results for orders placed or performed during the hospital encounter of 12/26/15 (from the past 48 hour(s))  Protime-INR     Status: Abnormal   Collection Time: 02/02/16  4:49 AM  Result Value Ref Range   Prothrombin Time 20.3 (H) 11.6 - 15.2 seconds   INR 1.74 (H) 0.00 - 1.49  Protime-INR     Status: Abnormal   Collection Time: 02/03/16  6:20 AM  Result Value Ref Range   Prothrombin Time 23.4 (H) 11.6 - 15.2 seconds   INR 2.10 (H) 0.00 - 1.49   No results found.     Medical Problem List and Plan: 1.  Debility secondary to sepsis and NSTEMI with subsequent CABG/ascending aneurysm repair.  2.  DVT Prophylaxis/Anticoagulation: Pharmaceutical: Coumadin--INR therapeutic today.  3. Pain Management:  Continue oxycodone prn.  4. Mood: Team to provide ego support. LCSW to follow for evaluation and support.  5. Neuropsych: This patient is capable of making decisions on his own behalf. 6. Skin/Wound Care: Monitor nutrition and hydration status.  7. Fluids/Electrolytes/Nutrition: Monitor I/O. Check lytes in am.  8. CAD s/p CABG x 4:  To continue sternal precautions. On ASA, BB--no statin due to shocked liver.   9. A fib: Monitor heart rate bid and with activity. Continue coumadin, metoprolol and  digoxin.  10 COPD: On spiriva, Pulmicort nebs and Mucinex bid.  Oxygen dependent at this time.  11. Leucocytosis: Monitor for signs of infection. Completed antibiotics 03/28.  12. Hyponatremia: Likely dilutional. Will check labs in am. 13.  Thrombocytopenia:  Continue to monitor platelets as on downward trend. Has been off heparin since surgery. Recheck LFTs in am.  14. ABLA:  Stable. Recheck CBC in am.   Post Admission Physician Evaluation: 1. Functional deficits secondary  to to sepsis and NSTEMI with subsequent CABG/ascending aneurysm repair. 2. Patient is admitted to receive collaborative, interdisciplinary care between the physiatrist, rehab nursing staff, and therapy team. 3. Patient's level of medical complexity and substantial therapy needs in context of that medical necessity cannot be provided at a lesser intensity of care such as a SNF. 4. Patient has experienced substantial functional loss from his/her baseline which was documented above under the "Functional History" and "Functional Status" headings.  Judging by the patient's diagnosis, physical exam, and functional history, the patient has potential for functional progress which will result in measurable gains while on inpatient rehab.  These gains will be of substantial and practical use upon   discharge  in facilitating mobility and self-care at the household level. 5. Physiatrist will provide 24 hour management of medical needs as well as oversight of the therapy plan/treatment and provide guidance as appropriate regarding the interaction of the two. 6. 24 hour rehab nursing will assist with bladder management, bowel management, safety, skin/wound care, disease management, medication administration, pain management and patient and help integrate therapy concepts, techniques,education, etc. 7. PT will assess and treat for/with: Lower extremity strength, range of motion, stamina, balance, functional mobility, safety, adaptive techniques  and equipment, woundcare, coping skills, pain control, education.   Goals are: Mod I/Supervision. 8. OT will assess and treat for/with: ADL's, functional mobility, safety, upper extremity strength, adaptive techniques and equipment, wound mgt, ego support, and community reintegration.   Goals are: Mod I/Supervision. Therapy may not proceed with showering this patient. 9. Case Management and Social Worker will assess and treat for psychological issues and discharge planning. 10. Team conference will be held weekly to assess progress toward goals and to determine barriers to discharge. 11. Patient will receive at least 3 hours of therapy per day at least 5 days per week. 12. ELOS: 8-12 days.       13. Prognosis:  excellent  Talayah Picardi, MD 02/03/2016 

## 2016-02-03 NOTE — Progress Notes (Signed)
CARDIAC REHAB PHASE I   PRE:  Rate/Rhythm: 77 afib    BP: sitting 115/76    SaO2: 97 1 1/2L  MODE:  Ambulation: 150 ft   POST:  Rate/Rhythm: 102 afib    BP: sitting 157/83     SaO2: 90 2L  Pt nervous today due to legs giving out yesterday and sitting in floor. Used gait belt with min assist to stand. He sts his legs feel stronger today but he still worries. Pt was able to walk with rollator, assist x1, and 2L O2. Sat and rested after 50 ft, he sts due to weak legs.  Pt wanted to return to room however I was able to encourage him to increase his distance. He then ambulated the rest of way (100 ft) back to EOB. SaO2 90 2L. Pt continues to be easily frustrated and nervous but happy he was able to walk farther.  Ed completed with pt including low sodium, sternal precautions, and CRPII. Voices understanding and [facility]  [phone]   Harriet MassonRandi Kristan Jacinta Penalver CES, ACSM 02/03/2016 1:43 PM

## 2016-02-03 NOTE — Interval H&P Note (Signed)
Craig [name] was admitted today to Inpatient Rehabilitation with the diagnosis of sepsis and NSTEMI with subsequent CABG/ascending aneurysm repair.  The patient's history has been reviewed, patient examined, and there is no change in status.  Patient continues to be appropriate for intensive inpatient rehabilitation.  I have reviewed the patient's chart and labs.  Questions were answered to the patient's satisfaction. The PAPE has been reviewed and assessment remains appropriate.  Ankit Karis Jubanil Patel 02/03/2016, 9:02 PM

## 2016-02-03 NOTE — Care Management Note (Signed)
Case Management Note Previous CM note initiated by Craig Blend RN, CM  Patient Details  Name: Craig [name] MRN: [id number] Date of Birth: 1951-08-29     Subjective/Objective:        Pt admitted with Acute Respiratory Failure    Action/Plan:   02/03/2016  Pt required pressors overnight for low BP.  CM spoke with PT post am evaluation; pt is very slow to progress.  Per PT; Recommendation continues to be CIR with SNF back up plan based on current level of activity.  CM will continue to monitor for disposition needs.  01/27/16 Pt is s/p Bilateral Pluerx Catheters - at this time it is undetermined if pt will discharge with catheters; CM will continue to follow for discharge needs.  CIR continues to follow pt as well as CSW .  Craig [name] Craig [name] -  FC will come to pts room tomorrow tentatively 10am to facilitate Medicaid application, Ms Craig [name] also arranged for disability consideration via the service center.  Sister made aware.  CM received call from family member; family stated pts requesting discharge to sisters home with 24 hour care provided by sister if not accepted into CIR.  CM went back to speak with pt; pt now concurs that he would prefer to discharge to sisters care if not accepted into CIR.  CM spoke with PT prior to tentative therapy session;  Per last completed session of PT evaluation of pt; at this point recommendation would be for SNF if unable to discharge to CIR,   PT will continue to follow as discharge needs may change as pt progresses,  Pt declined PT evaluation for today.  CM contacted financial consuelor Craig [name] [phone] to inquire if pt has been reassessed for medicaid eligibility post surgery - left voicemail.  CM spoke with pt regarding recommended discharge plan CIR vs SNF; pt did not voice concerns to either option,  Correction to previous entry; son's name is Craig [name] not Archivist.  CM will continue to monitor  01/20/16 Pt still  experiencing respiratory issues and remains in ICU - [facility] for 24 hours.  Recommendations have been placed for CIR.  CSW consulted for SNF backup plan.  CM will continue to monitor for disposition needs  01/08/16 Pt is s/p CABG  01/06/16 Pt is independent from home with son Craig [name].  Pt is without insurance; will need PCP initiated at [facility] clinic prior to discharge. CM will continue to monitor for disposition needs   Expected Discharge Date:    02/03/16              Expected Discharge Plan:  IP Rehab Facility  In-House Referral:     Discharge planning Services  CM Consult  Post Acute Care Choice:  IP Rehab Choice offered to:  Patient  DME Arranged:    DME Agency:     HH Arranged:    HH Agency:     Status of Service:  Completed, signed off  Medicare Important Message Given:    Date Medicare IM Given:    Medicare IM give by:    Date Additional Medicare IM Given:    Additional Medicare Important Message give by:     If discussed at Long Length of Stay Meetings, dates discussed:  01/05/16, 01/07/16, 02/02/16  Discharge Disposition: IP rehab  Additional Comments:   02/03/16- 1100- Craig Pierini RN, BSN- pt for d/c to [facility] today- have confirmed that bed is available- spoke with pt and called sister to get both  address for PleurX form- pt's address is [address] [place]-  He will be going to [employer] T[employer]ister's house- Craig [name]- after CIR stay- who's address is [address] [place] [address] phone: [phone]--- have faxed Pleurx Forms to [employer]- forms on Shadow Chart- will need to be refaxed on discharge from CIR with completed HH section if pt needs continued drainage- once completed forms refaxed - will need to mail original forms to [employer]- instructions on shadow chart with forms. [name] with CIR d/c planners  02/02/16- 1400- Craig PieriniKristi Deshan Hemmelgarn RN, BSN- spoke with Craig [name] - plan for pt to d/c to CIR tomorrow- with PleurX caths- forms  signed on shadow chart- will finish filling out and fax to [employer]- [name]- CIR will f/u if pt need continued drainage on discharge from CIR and arrange HH - CIR will update forms and refax to [employer] for PleurX Cath needs   02/01/16- 1500- Craig PieriniKristi Mitesh Rosendahl RN, BSN- pt tx back to 2W from 2S- d/c planning for CIBritta [name]- Barbara from CIR is following for potential admission to IP rehab- pt with pleurx Cath drains- form on shadow chart for MD signature- CM to f/u for d/c needs  01/06/16- 1145- Craig Villamar RN, BSN- plan for CABG/AVR tomorrow 3/2- CM will follow post op for d/c needs  Craig SpanWebster, Craig Gossett Hall, RN 02/03/2016, 11:03 AM

## 2016-02-03 NOTE — Progress Notes (Signed)
ANTICOAGULATION CONSULT NOTE - Initial Consult  Pharmacy Consult:  Coumadin Indication: atrial fibrillation  No Known Allergies  Patient Measurements: Height: 5\' 9"  (175.3 cm) Weight: 166 lb 0.1 oz (75.3 kg) IBW/kg (Calculated) : 70.7  Vital Signs: Temp: 97.7 F (36.5 C) (03/29 1714) Temp Source: Oral (03/29 1714) BP: 122/68 mmHg (03/29 1714) Pulse Rate: 85 (03/29 1714)  Labs:  Recent Labs  02/01/16 0415 02/02/16 0449 02/03/16 0620  HGB 10.0*  --   --   HCT 32.0*  --   --   PLT 135*  --   --   LABPROT 21.6* 20.3* 23.4*  INR 1.89* 1.74* 2.10*  CREATININE 0.99  --   --     Estimated Creatinine Clearance: 75.4 mL/min (by C-G formula based on Cr of 0.99).   Medical History: Past Medical History  Diagnosis Date  . COPD (chronic obstructive pulmonary disease) (HCC)   . Hypertension   . Hyperlipidemia   . Alcohol abuse     Prior  . Tobacco abuse     Prior       Assessment: [age] YOM transferred to Rehab on 02/03/16 s/p admission for sepsis and NSTEMI post CABG and pericardial AVR.  MD has been managing Coumadin for Afib and now to transition to Pharmacy.  INR is therapeutic today at 2.1.  No bleeding reported.   Goal of Therapy:  INR 2-3    Plan:  - Coumadin 2.5mg  PO now - Daily PT / INR     Oliviarose Punch D. Laney Potashang, PharmD, BCPS Pager:  [phone] 02/03/2016, 8:18 PM

## 2016-02-04 ENCOUNTER — Inpatient Hospital Stay (HOSPITAL_COMMUNITY): Payer: Medicaid Other | Admitting: Occupational Therapy

## 2016-02-04 ENCOUNTER — Inpatient Hospital Stay (HOSPITAL_COMMUNITY): Payer: Medicaid Other | Admitting: Physical Therapy

## 2016-02-04 DIAGNOSIS — E46 Unspecified protein-calorie malnutrition: Secondary | ICD-10-CM | POA: Diagnosis present

## 2016-02-04 DIAGNOSIS — E875 Hyperkalemia: Secondary | ICD-10-CM

## 2016-02-04 LAB — CBC WITH DIFFERENTIAL/PLATELET
BASOS ABS: 0.2 10*3/uL — AB (ref 0.0–0.1)
BASOS PCT: 2 %
EOS ABS: 0.2 10*3/uL (ref 0.0–0.7)
EOS PCT: 2 %
HCT: 33.4 % — ABNORMAL LOW (ref 39.0–52.0)
Hemoglobin: 10.2 g/dL — ABNORMAL LOW (ref 13.0–17.0)
LYMPHS PCT: 14 %
Lymphs Abs: 1.3 10*3/uL (ref 0.7–4.0)
MCH: 29.7 pg (ref 26.0–34.0)
MCHC: 30.5 g/dL (ref 30.0–36.0)
MCV: 97.1 fL (ref 78.0–100.0)
Monocytes Absolute: 0.9 10*3/uL (ref 0.1–1.0)
Monocytes Relative: 10 %
Neutro Abs: 6.9 10*3/uL (ref 1.7–7.7)
Neutrophils Relative %: 72 %
PLATELETS: 179 10*3/uL (ref 150–400)
RBC: 3.44 MIL/uL — AB (ref 4.22–5.81)
RDW: 17.8 % — ABNORMAL HIGH (ref 11.5–15.5)
WBC: 9.6 10*3/uL (ref 4.0–10.5)

## 2016-02-04 LAB — COMPREHENSIVE METABOLIC PANEL
ALBUMIN: 1.8 g/dL — AB (ref 3.5–5.0)
ALT: 76 U/L — AB (ref 17–63)
AST: 38 U/L (ref 15–41)
Alkaline Phosphatase: 90 U/L (ref 38–126)
Anion gap: 5 (ref 5–15)
BUN: 24 mg/dL — AB (ref 6–20)
CHLORIDE: 97 mmol/L — AB (ref 101–111)
CO2: 30 mmol/L (ref 22–32)
CREATININE: 1.19 mg/dL (ref 0.61–1.24)
Calcium: 8.3 mg/dL — ABNORMAL LOW (ref 8.9–10.3)
GFR calc Af Amer: >60 mL/min (ref >60)
GFR calc non Af Amer: >60 mL/min (ref >60)
Glucose, Bld: 98 mg/dL (ref 65–99)
POTASSIUM: 5.3 mmol/L — AB (ref 3.5–5.1)
SODIUM: 132 mmol/L — AB (ref 135–145)
Total Bilirubin: 0.9 mg/dL (ref 0.3–1.2)
Total Protein: 5.2 g/dL — ABNORMAL LOW (ref 6.5–8.1)

## 2016-02-04 LAB — PROTIME-INR
INR: 2.31 — ABNORMAL HIGH (ref 0.00–1.49)
Prothrombin Time: 25.1 seconds — ABNORMAL HIGH (ref 11.6–15.2)

## 2016-02-04 MED ORDER — WARFARIN SODIUM 2.5 MG PO TABS
2.5000 mg | ORAL_TABLET | Freq: Every day | ORAL | Status: DC
  Administered 2016-02-04: 2.5 mg via ORAL
  Filled 2016-02-04: qty 1

## 2016-02-04 MED ORDER — BENEPROTEIN PO POWD
1.0000 | Freq: Three times a day (TID) | ORAL | Status: DC
  Administered 2016-02-04 – 2016-02-12 (×22): 6 g via ORAL
  Filled 2016-02-04: qty 227

## 2016-02-04 NOTE — Progress Notes (Signed)
Physical Therapy Session Note  Patient Details  Name: DELMONT PROSCH MRN: [id number] Date of Birth: October 30, 1951  Today's Date: 02/04/2016 PT Individual Time: 1330-1410 PT Individual Time Calculation (min): 40 min   Short Term Goals: Week 1:  PT Short Term Goal 1 (Week 1): =LTGs due to ELOS  Skilled Therapeutic Interventions/Progress Updates:  Pt received resting in bed and agreeable to therapy session, no c/o pain.  Stand/pivot from EOB>w/c with mod assist to rise but successful on first attempt.  PT instructed pt in stair negotiation x8 steps with 2 hand rails and steady assist for balance.  Pt's sister reports she has 3-4 "half steps" to enter and she thinks there is just one rail on L.  PT adjusted pt's w/c and provided ELRs for edema management.  Pt educated on edema management and having LEs in non-dependent position and pt verbalized understanding.  Pt returned to room at end of session and positioned in w/c with call bell in reach and needs met.   Therapy Documentation Precautions:  Precautions Precautions: Fall, Sternal Precaution Comments: pt able to recall 2/3 sternal precautions and 3/3 with min question cues Restrictions Weight Bearing Restrictions: Yes Other Position/Activity Restrictions: sternal precautions   See Function Navigator for Current Functional Status.   Therapy/Group: Individual Therapy  Earnest Conroy Penven-Crew 02/04/2016, 2:19 PM

## 2016-02-04 NOTE — Progress Notes (Addendum)
[place] PHYSICAL MEDICINE & REHABILITATION     PROGRESS NOTE  Subjective/Complaints:  Patient sitting up in bed this morning. He had a good night last night and is ready to begin therapies today.  ROS: Denies CP, SOB, N/V/D  Objective: Vital Signs: Blood pressure 109/73, pulse 73, temperature 97.4 F (36.3 C), temperature source Axillary, resp. rate 20, height  (1.753 m), weight 78.6 kg (173 lb 4.5 oz), SpO2 95 %. No results found.  Recent Labs  02/04/16 0344  WBC 9.6  HGB 10.2*  HCT 33.4*  PLT 179    Recent Labs  02/04/16 0344  NA 132*  K 5.3*  CL 97*  GLUCOSE 98  BUN 24*  CREATININE 1.19  CALCIUM 8.3*   CBG (last 3)  No results for input(s): GLUCAP in the last 72 hours.  Wt Readings from Last 3 Encounters:  02/04/16 78.6 kg (173 lb 4.5 oz)  02/03/16 75.1 kg (165 lb 9.1 oz)  03/31/15 66.951 kg (147 lb 9.6 oz)    Physical Exam:  BP 109/73 mmHg  Pulse 73  Temp(Src) 97.4 F (36.3 C) (Axillary)  Resp 20  Ht  (1.753 m)  Wt 78.6 kg (173 lb 4.5 oz)  BMI 25.58 kg/m2  SpO2 95% Constitutional: He appears well-developed and well-nourished. Nasal cannula in place. NAD HENT: Normocephalic and atraumatic.  Eyes: Conjunctivae and EOM are normal. Pupils are equal, round, and reactive to light. No scleral icterus.  Cardiovascular: An irregularly irregular rhythm present.   Respiratory: No respiratory distress. Unlabored breathing. He has rhonchi in the right lower field and the left lower field. He exhibits no tenderness.  GI: Soft. Bowel sounds are normal. He exhibits no distension. There is no tenderness.  Musculoskeletal: He exhibits edema.  2-3 + pitting edema bilateral upper thighs right > left. 2+ pedal edema. Diffuse ecchymosis bilateral shins down to ankles.  Neurological: He is alert and oriented.  Speech clear.  He was able to follow simple commands without difficulty.  Motor: Proximally 4/5, distally 5/5  Skin: Skin is warm and dry.   Midline chest incision clean, dry and intact. Torso with dry dressing covering tubing bilaterally. Pacer sutures in place.  Psychiatric: His speech is normal. Cognition and memory are not impaired. Behavior and affect are normal.  Assessment/Plan: 1. Functional deficits secondary to sepsis and NSTEMI with subsequent CABG/ascending aneurysm repair which require 3+ hours per day of interdisciplinary therapy in a comprehensive inpatient rehab setting. Physiatrist is providing close team supervision and 24 hour management of active medical problems listed below. Physiatrist and rehab team continue to assess barriers to discharge/monitor patient progress toward functional and medical goals.  Function:  Bathing Bathing position      Bathing parts      Bathing assist        Upper Body Dressing/Undressing Upper body dressing                    Upper body assist        Lower Body Dressing/Undressing Lower body dressing                                  Lower body assist        Toileting Toileting          Toileting assist     Transfers Chair/bed transfer             Locomotion  Ambulation           Wheelchair          Cognition Comprehension    Expression    Social Interaction    Problem Solving    Memory      Medical Problem List and Plan: 1. Debility secondary to sepsis and NSTEMI with subsequent CABG/ascending aneurysm repair.   Begin CIR 2. DVT Prophylaxis/Anticoagulation: Pharmaceutical: Coumadin  INR therapeutic today.  3. Pain Management: Continue oxycodone prn.  4. Mood: Team to provide ego support. LCSW to follow for evaluation and support.  5. Neuropsych: This patient is capable of making decisions on his own behalf. 6. Skin/Wound Care: Monitor nutrition and hydration status.  7. Fluids/Electrolytes/Nutrition: Monitor I/O.   Hyponatremia: 132 on 3/30 (stable)  Hypokalemia: 5.3 on 3/30. Will continue to monitor 8.  CAD s/p CABG x 4: To continue sternal precautions. On ASA, BB--no statin due to shocked liver.  9. A fib: Monitor heart rate bid and with activity. Continue coumadin, metoprolol and digoxin.  10. COPD: On spiriva, Pulmicort nebs and Mucinex bid. Oxygen dependent at this time.  11. Leucocytosis: Resolved   Monitor for signs of infection.   Completed antibiotics 03/28.  12. Thrombocytopenia: Continue to monitor platelets. Has been off heparin since surgery.   13. ABLA: Stable.   Hemoglobin 10.2 on 3/30  Will continue to monitor  14. Hypoalbuminemia  Supplementation started on 3/30   LOS (Days) 1 A FACE TO FACE EVALUATION WAS PERFORMED  Ankit Karis Jubanil Patel 02/04/2016 9:31 AM

## 2016-02-04 NOTE — IPOC Note (Signed)
Overall Plan of Care [facility]) Patient Details Name: Craig [name] MRN: [id number] DOB: 12-05-1950  Admitting Diagnosis: debililty  CAB  AVR   Hospital Problems: Active Problems:   Debility   History of sepsis   History of non-ST elevation myocardial infarction (NSTEMI)   Coronary artery disease involving coronary bypass graft of native heart without angina pectoris   History of aortic aneurysm repair   Chronic obstructive pulmonary disease (HCC)   Leukocytosis   Hyponatremia   Thrombocytopenia (HCC)   Acute blood loss anemia   Hyperkalemia   Hypoalbuminemia due to protein-calorie malnutrition (HCC)     Functional Problem List: Nursing Motor, Medication Management, Pain, Edema, Endurance, Safety, Nutrition, Skin Integrity  PT Balance, Behavior, Endurance, Motor, Safety  OT Balance, Endurance, Motor, Pain, Safety  SLP    TR         Basic ADL's: OT Grooming, Bathing, Dressing, Toileting     Advanced  ADL's: OT Laundry, Simple Meal Preparation     Transfers: PT Bed Mobility, Bed to Chair, Set designer, Oncologist: PT Ambulation, Stairs     Additional Impairments: OT None  SLP        TR      Anticipated Outcomes Item Anticipated Outcome  Self Feeding n/a  Swallowing      Basic self-care  supervision overall  Toileting  supervision overall   Bathroom Transfers supervision overall  Bowel/Bladder  Pt will manage bladder and bowel with Mod I assist   Transfers  supervision  Locomotion  supervision  Communication     Cognition     Pain  Pt will rate pain at 4 or less on a scale of 0-10.   Safety/Judgment  Pt will remain free of falls and injury with min assist and cues    Therapy Plan: PT Intensity: Minimum of 1-2 x/day ,45 to 90 minutes PT Frequency: 5 out of 7 days PT Duration Estimated Length of Stay: 7-10 OT Intensity: Minimum of 1-2 x/day, 45 to 90 minutes OT Frequency: 5 out of 7 days OT Duration/Estimated Length of Stay:  7-10 days         Team Interventions: Nursing Interventions Patient/Family Education, Bowel Management, Pain Management, Disease Management/Prevention, Medication Management, Skin Care/Wound Management, Cognitive Remediation/Compensation, Discharge Planning, Psychosocial Support  PT interventions Ambulation/gait training, DME/adaptive equipment instruction, Neuromuscular re-education, Stair training, Psychosocial support, UE/LE Strength taining/ROM, Wheelchair propulsion/positioning, UE/LE Coordination activities, Therapeutic Activities, Pain management, Warden/ranger, Functional mobility training, Patient/family education, Therapeutic Exercise  OT Interventions Balance/vestibular training, Community reintegration, Discharge planning, Pain management, UE/LE Coordination activities, UE/LE Strength taining/ROM, Self Care/advanced ADL retraining, Functional mobility training, Psychosocial support, Therapeutic Exercise, Therapeutic Activities, Wheelchair propulsion/positioning, Patient/family education, DME/adaptive equipment instruction  SLP Interventions    TR Interventions    SW/CM Interventions Discharge Planning, Facilities manager, Patient/Family Education    Team Discharge Planning: Destination: PT-Home ,OT- Home , SLP-  Projected Follow-up: PT-Home health PT, 24 hour supervision/assistance, OT-  24 hour supervision/assistance, Home health OT, SLP-  Projected Equipment Needs: PT-To be determined, OT- To be determined, SLP-  Equipment Details: PT- , OT-  Patient/family involved in discharge planning: PT- Patient,  OT-Patient, SLP-   MD ELOS: 8-12 days 12 days Medical Rehab Prognosis:  Excellent Assessment: 65 year old male with history of HTN, ETOH abuse, COPD who was admitted on 12/26/15 with CAP and elevated cardiac enzymes. He was started on IV antibiotics as well as IV heparin for NSTEMI. He developed ARDS requiring intubation  on 02/19 and CT chest done revealing diffuse  emphysema with ascending aortic aneurysm 5.2 cm. Patient self extubated on 02/20 and treated with IV steroids for acute exacerbation of COPD with component of pulmonary edema. 2D echo with EF 35- 40%, with moderate concentric LVH, akinesis and heavily calcified AV. Cardiac cath done revealing left main and 3V CAD. He developed PAF and was started on amiodarone for treatment. He underwent CABG X 4 and replacement of ascending thoracic aneurysm on 03/02 by Dr. Laneta SimmersBartle. Post op had issues with acute on chronic renal failure, Abn LFTs due to shocked liver as well as hypotension with respiratory failure requiring reintubation on 03/06-3/7 due to septic shock. He continued to have issues with hypoxia requiring BIPAP and underwent right thoracocentesis of 1400 cc on 3/11 and left thoracocentesis of 1300 cc on 3/12. He has had issues with anxiety and deconditioning. He developed recurrent bilateral effusions requiring bilateral thoracocentesis with placement of pleurex catheters on 03/20. Post procedure with hypotension requiring pressors question due to sepsis and was started on IV Maxipime and Vancomycin with improvement. He remains afebrile with improvement in WBC. Afib controlled on metoprolol and digoxin.   See Team Conference Notes for weekly updates to the plan of care

## 2016-02-04 NOTE — Progress Notes (Addendum)
ANTICOAGULATION CONSULT NOTE - Initial Consult  Pharmacy Consult:  Coumadin Indication: atrial fibrillation  No Known Allergies  Patient Measurements: Height: 5\' 9"  (175.3 cm) Weight: 173 lb 4.5 oz (78.6 kg) IBW/kg (Calculated) : 70.7  Vital Signs: Temp: 97.4 F (36.3 C) (03/30 0431) Temp Source: Axillary (03/30 0431) BP: 94/60 mmHg (03/30 0800) Pulse Rate: 74 (03/30 0941)  Labs:  Recent Labs  02/02/16 0449 02/03/16 0620 02/04/16 0344  HGB  --   --  10.2*  HCT  --   --  33.4*  PLT  --   --  179  LABPROT 20.3* 23.4* 25.1*  INR 1.74* 2.10* 2.31*  CREATININE  --   --  1.19    Estimated Creatinine Clearance: 62.7 mL/min (by C-G formula based on Cr of 1.19).   Medical History: Past Medical History  Diagnosis Date  . COPD (chronic obstructive pulmonary disease) (HCC)   . Hypertension   . Hyperlipidemia   . Alcohol abuse     Prior  . Tobacco abuse     Prior       Assessment: [age] YOM transferred to Rehab on 02/03/16 s/p admission for sepsis and NSTEMI post CABG and pericardial AVR.  MD has been managing Coumadin for Afib and now to transition to Pharmacy.  INR is therapeutic today at 2.31. hgb 10.2, pltc 179 K, stable,  No bleeding reported   Goal of Therapy:  INR 2-2.5 (per Dr. Sharee PimpleBartle's note on 3/13)   Plan:  - Coumadin 2.5mg  PO daily - Daily PT / INR   Bayard HuggerMei Sidonie Dexheimer, PharmD, BCPS  Clinical Pharmacist  Pager: [phone]   02/04/2016, 12:19 PM

## 2016-02-04 NOTE — Evaluation (Signed)
Physical Therapy Assessment and Plan  Patient Details  Name: Craig [name] MRN: [id number] Date of Birth: May 30, 1951  PT Diagnosis: Coordination disorder, Difficulty walking and Muscle weakness Rehab Potential: Fair ELOS: 7-10   Today's Date: 02/04/2016 PT Individual Time: 5465-6812 PT Individual Time Calculation (min): 61 min    Problem List:  Patient Active Problem List   Diagnosis Date Noted  . Hyperkalemia   . Hypoalbuminemia due to protein-calorie malnutrition ([place])   . Debility 02/03/2016  . History of sepsis   . History of non-ST elevation myocardial infarction (NSTEMI)   . Coronary artery disease involving coronary bypass graft of native heart without angina pectoris   . History of aortic aneurysm repair   . Chronic obstructive pulmonary disease ([place])   . Leukocytosis   . Hyponatremia   . Thrombocytopenia ([place])   . Acute blood loss anemia   . Sepsis ([place])   . S/P CABG (coronary artery bypass graft)   . Paroxysmal atrial fibrillation (HCC)   . S/P CABG x 4   . Pleural effusion   . Acute respiratory acidosis   . Acute renal failure (ARF) ([place])   . Severe aortic stenosis   . Coronary artery disease involving native coronary artery of native heart without angina pectoris   . Coronary artery disease involving native coronary artery of native heart with unstable angina pectoris ([place])   . Acute respiratory failure ([place])   . CAD (coronary artery disease)   . Aortic stenosis   . Acute respiratory failure with hypoxia ([place]) 12/27/2015  . Elevated troponin   . History of hypertension   . History of hyperlipidemia   . Ascending aortic aneurysm ([place])   . CAP (community acquired pneumonia) 12/26/2015  . NSTEMI (non-ST elevated myocardial infarction) ([place]) 12/26/2015  . COPD (chronic obstructive pulmonary disease) ([place]) 12/26/2015  . Hypoxia 12/26/2015  . Ruptured lumbar disc 02/17/2015  . Back pain 02/17/2015    Past Medical History:  Past Medical History   Diagnosis Date  . COPD (chronic obstructive pulmonary disease) ([place])   . Hypertension   . Hyperlipidemia   . Alcohol abuse     Prior  . Tobacco abuse     Prior   Past Surgical History:  Past Surgical History  Procedure Laterality Date  . Ruptured disc repair  2009 or 2010  . Cardiac catheterization N/A 12/29/2015    Procedure: Right/Left Heart Cath and Coronary Angiography;  Surgeon: Peter M Martinique, MD;  Location: [facility];  Service: Cardiovascular;  Laterality: N/A;  . Coronary artery bypass graft N/A 01/07/2016    Procedure: CORONARY ARTERY BYPASS GRAFTING (CABG), ON PUMP, TIMES FOUR, USING LEFT INTERNAL MAMMARY ARTERY, BILATERAL GREATER SAPHENOUS VEINS HARVESTED ENDOSCOPICALLY;  Surgeon: Gaye Pollack, MD;  Location: [facility];  Service: Open Heart Surgery;  Laterality: N/A;  LIMA to LAD, SVG to OM, SVG SEQUENTIALLY to PDA and PLB  . Tee without cardioversion N/A 01/07/2016    Procedure: TRANSESOPHAGEAL ECHOCARDIOGRAM (TEE);  Surgeon: Gaye Pollack, MD;  Location: [facility];  Service: Open Heart Surgery;  Laterality: N/A;  . Thoracic aortic aneurysm repair N/A 01/07/2016    Procedure: CIRC ARREST AND REPLACEMENT OF ASCENDING THORACIC  ANEURYSM;  Surgeon: Gaye Pollack, MD;  Location: [facility];  Service: Open Heart Surgery;  Laterality: N/A;  . Chest tube insertion Bilateral 01/25/2016    Procedure: INSERTION Bilateral PLEURAL DRAINAGE CATHETER;  Surgeon: Gaye Pollack, MD;  Location: [facility];  Service: Thoracic;  Laterality: Bilateral;  Assessment & Plan Clinical Impression: [name] is a 65 y.o. right handed male with history of COPD/tobacco and alcohol abuse.  Presented 12/26/2015 with flulike symptoms including shortness of breath, cough and low-grade fever as well as chills 2 days. Nonspecific chest tightness. He was evaluated in the ED found to be hypoxic placed on supplemental oxygen. Chest x-ray showed right upper lobe pneumonia. CT angiogram of the chest no pulmonary  emboli. Troponin elevated 2.32. Placed on broad-spectrum antibiotics. Cardiology services consulted. EKG demonstrated variable sinus tachycardia and ectopic atrial tachycardia, inferior Q waves, transient inferior ST-T wave abnormalities. Echocardiogram with ejection fraction of 40%. Akinesis of the mid-apical inferior lateral myocardium. Critical care pulmonary services was consulted 12/27/2015 for increasing shortness of breath patient ultimately intubated. Cardiac catheterization 12/29/2015 showed 50% ostial LM stenosis and three-vessel coronary artery disease with severe aortic stenosis. Cardiothoracic surgery Dr. Cyndia Bent consulted with patient stabilized and underwent CABG with aortic valve replacement 01/07/2016. Follow-up echocardiogram 01/11/2016 showed LV and RV systolic function normal. Patient currently maintained on Coumadin therapy. He was extubated and  diet has been slowly advanced to regular.  Patient transferred to CIR on 02/03/2016 .   Patient currently requires mod with mobility secondary to muscle weakness, decreased cardiorespiratoy endurance and decreased standing balance, decreased balance strategies and difficulty maintaining precautions.  Prior to hospitalization, patient was independent  with mobility and lived with Son in a House home.  Home access is 2 (pt unsure of set up at sister's home but believes there are 2 steps)Stairs to enter.  Patient will benefit from skilled PT intervention to maximize safe functional mobility, minimize fall risk and decrease caregiver burden for planned discharge home with 24 hour supervision.  Anticipate patient will benefit from follow up [employer] at discharge.  PT - End of Session Activity Tolerance: Tolerates 10 - 20 min activity with multiple rests Endurance Deficit: Yes Endurance Deficit Description: cardiopulmonary endurance deficit likely due to prolonged hospitalization PT Assessment Rehab Potential (ACUTE/IP ONLY): Fair Barriers to Discharge:  Decreased caregiver support;Other (comment) (motivation and self limiting behavior) PT Patient demonstrates impairments in the following area(s): Balance;Behavior;Endurance;Motor;Safety PT Transfers Functional Problem(s): Bed Mobility;Bed to Chair;Car;Furniture PT Locomotion Functional Problem(s): Ambulation;Stairs PT Plan PT Intensity: Minimum of 1-2 x/day ,45 to 90 minutes PT Frequency: 5 out of 7 days PT Duration Estimated Length of Stay: 7-10 PT Treatment/Interventions: Ambulation/gait training;DME/adaptive equipment instruction;Neuromuscular re-education;Stair training;Psychosocial support;UE/LE Strength taining/ROM;Wheelchair propulsion/positioning;UE/LE Coordination activities;Therapeutic Activities;Pain management;Balance/vestibular training;Functional mobility training;Patient/family education;Therapeutic Exercise PT Transfers Anticipated Outcome(s): supervision PT Locomotion Anticipated Outcome(s): supervision PT Recommendation Recommendations for Other Services: Neuropsych consult Follow Up Recommendations: Home health PT;24 hour supervision/assistance Patient destination: Home Equipment Recommended: To be determined  Skilled Therapeutic Intervention Pt received resting in bed with no c/o pain and agreeable to therapy session.  PT explained PT role, goals of therapy, and plan of care.  PT instructed pt in supine>sit with supervision and verbal cues for maintaining sternal precautions.  Stand/pivot from EOB>w/c with 2 attempts and pt requiring mod assist to rise.  Pt demos excessive rocking for "momentum" which throws his weight forward and outside his BOS making it difficult for him to stand; PT provided education and pt minimally receptive.  Sit<>stand in therapy gym requiring 3 attempts to stand and re-education for effective forward weight shift and upward gaze with light mod assist to lift.  Pt states "when I count to 3 I expect to stand up."  Gait training x75' with rollator and  steady assist for balance during turning.  PT instructed pt in car transfer with therapist facilitating from front, mod assist to rise from w/c and from car and verbal cues for maintaining sternal precautions.  Pt returned to room in w/c at end of session and positioned with call bell in reach and needs met.   PT Evaluation Precautions/Restrictions Precautions Precautions: Fall;Sternal Precaution Comments: pt able to recall 2/3 sternal precautions and 3/3 with min question cues Restrictions Weight Bearing Restrictions: Yes Other Position/Activity Restrictions: sternal precautions Pain Pain Assessment Pain Assessment: No/denies pain Pain Score: 4  Pain Type: Acute pain;Surgical pain Pain Location: Chest Pain Descriptors / Indicators: Aching;Sore Pain Onset: On-going Patients Stated Pain Goal: 3 Pain Intervention(s): Repositioned;Distraction;Emotional support Multiple Pain Sites: No Home Living/Prior Functioning Home Living Available Help at Discharge: Family;Available 24 hours/day (plan to d/c to sister's home) Type of Home: House Home Access: Stairs to enter [employer] of Steps: 2 (pt unsure of set up at sister's home but believes there are 2 steps) Entrance Stairs-Rails:  (pt not sure) Home Layout: One level Bathroom Shower/Tub: Chiropodist: Standard Bathroom Accessibility: Yes  Lives With: Son Prior Function Level of Independence: Independent with gait;Independent with transfers  Able to Take Stairs?: Yes Driving: Yes Vocation: On disability 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  [employer] Overall Cognitive Status: Within Functional Limits for tasks assessed Arousal/Alertness: Awake/alert Orientation Level: Oriented X4 Memory: Appears intact Behaviors: Poor frustration tolerance Safety/Judgment: Appears intact Comments: pt easily frustrated and self limiting Sensation Sensation Light Touch: Appears  Intact Motor  Motor Motor: Within Functional Limits Motor - Skilled Clinical Observations: generalized weakness  Mobility Transfers Transfers: Yes Sit to Stand: 3: Mod assist Sit to Stand Details: Verbal cues for safe use of DME/AE;Verbal cues for sequencing;Verbal cues for technique;Tactile cues for posture Stand to Sit: 3: Mod assist Stand to Sit Details (indicate cue type and reason): Tactile cues for posture;Verbal cues for sequencing;Verbal cues for safe use of DME/AE;Verbal cues for technique Stand to Sit Details: pt demos excessive rocking back/forth for momentum, PT educated on appropriate forward weight shift to maximize independence with transfer Locomotion  Ambulation Ambulation: Yes Ambulation/Gait Assistance: 4: Min assist Ambulation Distance (Feet): 75 Feet Assistive device: Rollator Ambulation/Gait Assistance Details: Tactile cues for posture;Verbal cues for technique;Verbal cues for safe use of DME/AE Gait Gait: Yes Gait Pattern: Step-through pattern;Narrow base of support Gait velocity: slow Wheelchair Mobility Wheelchair Mobility: No  Trunk/Postural Assessment  Cervical Assessment Cervical Assessment: Exceptions to [facility] (forward head) Thoracic Assessment Thoracic Assessment: Exceptions to [facility] (rounded shoulders, unable to correct due to pain from incision) Lumbar Assessment Lumbar Assessment: Exceptions to [facility] (posterior pelvic tilt) Postural Control Postural Control: Deficits on evaluation Protective Responses: delayed 2/2 pain  Balance Balance Balance Assessed: Yes Static Standing Balance Static Standing - Balance Support: Bilateral upper extremity supported Static Standing - Level of Assistance: 5: Stand by assistance Dynamic Standing Balance Dynamic Standing - Balance Support: Bilateral upper extremity supported Dynamic Standing - Level of Assistance: 4: Min assist Extremity Assessment      RLE Assessment RLE Assessment: Exceptions to [facility] RLE  AROM (degrees) RLE Overall AROM Comments: WFL assessed in sitting RLE Strength RLE Overall Strength Comments: hip flexion 3+/5, knee flexion and extension 4+/5, DF and PF 4+/5 LLE Assessment LLE Assessment: Exceptions to WFL LLE AROM (degrees) LLE Overall AROM Comments: WFL assessed in sitting LLE Strength LLE Overall Strength Comments: hip flexion 3+/5, knee extension 4/5, knee flexion 4/5, DF and PF 4/5   See Function Navigator  for Current Functional Status.   Refer to Care Plan for Long Term Goals  Recommendations for other services: Neuropsych  Discharge Criteria: Patient will be discharged from PT if patient refuses treatment 3 consecutive times without medical reason, if treatment goals not met, if there is a change in medical status, if patient makes no progress towards goals or if patient is discharged from hospital.  The above assessment, treatment plan, treatment alternatives and goals were discussed and mutually agreed upon: by patient  Earnest Conroy Penven-Crew 02/04/2016, 12:33 PM

## 2016-02-04 NOTE — Evaluation (Signed)
Occupational Therapy Assessment and Plan and OT Intervention  Patient Details  Name: Craig [name] MRN: [id number] Date of Birth: 1951/09/04  OT Diagnosis: abnormal posture, acute pain and coordination disorder, generalized weakness Rehab Potential: Rehab Potential (ACUTE ONLY): Fair ELOS: 7-10 days   Today's Date: 02/04/2016 OT Individual Time: 5400-8676 and 1950-9326 OT Individual Time Calculation (min): 59 min and 58 min   Problem List:  Patient Active Problem List   Diagnosis Date Noted  . Hyperkalemia   . Debility 02/03/2016  . History of sepsis   . History of non-ST elevation myocardial infarction (NSTEMI)   . Coronary artery disease involving coronary bypass graft of native heart without angina pectoris   . History of aortic aneurysm repair   . Chronic obstructive pulmonary disease ([place])   . Leukocytosis   . Hyponatremia   . Thrombocytopenia ([place])   . Acute blood loss anemia   . Sepsis ([place])   . S/P CABG (coronary artery bypass graft)   . Paroxysmal atrial fibrillation ([facility])   . S/P CABG x 4   . Pleural effusion   . Acute respiratory acidosis   . Acute renal failure (ARF) ([place])   . Severe aortic stenosis   . Coronary artery disease involving native coronary artery of native heart without angina pectoris   . Coronary artery disease involving native coronary artery of native heart with unstable angina pectoris ([place])   . Acute respiratory failure ([place])   . CAD (coronary artery disease)   . Aortic stenosis   . Acute respiratory failure with hypoxia ([place]) 12/27/2015  . Elevated troponin   . History of hypertension   . History of hyperlipidemia   . Ascending aortic aneurysm ([place])   . CAP (community acquired pneumonia) 12/26/2015  . NSTEMI (non-ST elevated myocardial infarction) ([place]) 12/26/2015  . COPD (chronic obstructive pulmonary disease) ([place]) 12/26/2015  . Hypoxia 12/26/2015  . Ruptured lumbar disc 02/17/2015  . Back pain 02/17/2015    Past Medical  History:  Past Medical History  Diagnosis Date  . COPD (chronic obstructive pulmonary disease) ([place])   . Hypertension   . Hyperlipidemia   . Alcohol abuse     Prior  . Tobacco abuse     Prior   Past Surgical History:  Past Surgical History  Procedure Laterality Date  . Ruptured disc repair  2009 or 2010  . Cardiac catheterization N/A 12/29/2015    Procedure: Right/Left Heart Cath and Coronary Angiography;  Surgeon: Peter M Martinique, MD;  Location: [facility];  Service: Cardiovascular;  Laterality: N/A;  . Coronary artery bypass graft N/A 01/07/2016    Procedure: CORONARY ARTERY BYPASS GRAFTING (CABG), ON PUMP, TIMES FOUR, USING LEFT INTERNAL MAMMARY ARTERY, BILATERAL GREATER SAPHENOUS VEINS HARVESTED ENDOSCOPICALLY;  Surgeon: Gaye Pollack, MD;  Location: [facility];  Service: Open Heart Surgery;  Laterality: N/A;  LIMA to LAD, SVG to OM, SVG SEQUENTIALLY to PDA and PLB  . Tee without cardioversion N/A 01/07/2016    Procedure: TRANSESOPHAGEAL ECHOCARDIOGRAM (TEE);  Surgeon: Gaye Pollack, MD;  Location: [facility];  Service: Open Heart Surgery;  Laterality: N/A;  . Thoracic aortic aneurysm repair N/A 01/07/2016    Procedure: CIRC ARREST AND REPLACEMENT OF ASCENDING THORACIC  ANEURYSM;  Surgeon: Gaye Pollack, MD;  Location: [facility];  Service: Open Heart Surgery;  Laterality: N/A;  . Chest tube insertion Bilateral 01/25/2016    Procedure: INSERTION Bilateral PLEURAL DRAINAGE CATHETER;  Surgeon: Gaye Pollack, MD;  Location: [facility];  Service: Thoracic;  Laterality: Bilateral;    Assessment & Plan Clinical Impression: Patient is a 65 y.o. year old male with history of HTN, ETOH abuse, COPD who was admitted on 12/26/15 with CAP and elevated cardiac enzymes. He was started on IV antibiotics as well as IV heparin for NSTEMI. He developed ARDS requiring intubation on 02/19 and CT chest done revealing diffuse emphysema with ascending aortic aneurysm 5.2 cm. Patient self extubated on 02/20 and treated with  IV steroids for acute exacerbation of COPD with component of pulmonary edema. 2D echo with EF 35- 40%, with moderate concentric LVH, akinesis and heavily calcified AV. Cardiac cath done revealing left main and 3V CAD. He developed PAF and was started on amiodarone for treatment. He underwent CABG X 4 and replacement of ascending thoracic aneurysm on 03/02 by Dr. Cyndia Bent. Post op had issues with acute on chronic renal failure, Abn LFTs due to shocked liver as well as hypotension with respiratory failure requiring reintubation on 03/06-3/7 due to septic shock. He continued to have issues with hypoxia requiring BIPAP and underwent right thoracocentesis of 1400 cc on 3/11 and left thoracocentesis of 1300 cc on 3/12. He has had issues with anxiety and deconditioning. He developed recurrent bilateral effusions requiring bilateral thoracocentesis with placement of pleurex catheters on 03/20. Post procedure with hypotension requiring pressors question due to sepsis and was started on IV Maxipime and Vancomycin with improvement. He remains afebrile with improvement in WBC. Afib controlled on metoprolol and digoxin. Therapy ongoing and CIR recommended for follow up therapy.h   Patient transferred to CIR on 02/03/2016 .    Patient currently requires min - mod  with basic self-care skills and IADL secondary to muscle weakness, decreased cardiorespiratoy endurance and decreased sitting balance, decreased standing balance, decreased postural control and decreased balance strategies.  Prior to hospitalization, patient could complete ADLs and IADLs with independent .  Patient will benefit from skilled intervention to increase independence with basic self-care skills and increase level of independence with iADL prior to discharge home with care partner.  Anticipate patient will require 24 hour supervision and follow up home health.  OT - End of Session Activity Tolerance: Decreased this session Endurance Deficit:  Yes Endurance Deficit Description: multiple rest breaks secondary to fatigue OT Assessment Rehab Potential (ACUTE ONLY): Fair Barriers to Discharge:  (none known at this time) OT Patient demonstrates impairments in the following area(s): Balance;Endurance;Motor;Pain;Safety OT Basic ADL's Functional Problem(s): Grooming;Bathing;Dressing;Toileting OT Advanced ADL's Functional Problem(s): Laundry;Simple Meal Preparation OT Transfers Functional Problem(s): Toilet OT Additional Impairment(s): None OT Plan OT Intensity: Minimum of 1-2 x/day, 45 to 90 minutes OT Frequency: 5 out of 7 days OT Duration/Estimated Length of Stay: 7-10 days OT Treatment/Interventions: Balance/vestibular training;Community reintegration;Discharge planning;Pain management;UE/LE Coordination activities;UE/LE Strength taining/ROM;Self Care/advanced ADL retraining;Functional mobility training;Psychosocial support;Therapeutic Exercise;Therapeutic Activities;Wheelchair propulsion/positioning;Patient/family Biomedical scientist OT Self Feeding Anticipated Outcome(s): n/a OT Basic Self-Care Anticipated Outcome(s): supervision overall OT Toileting Anticipated Outcome(s): supervision overall OT Bathroom Transfers Anticipated Outcome(s): supervision overall OT Recommendation Recommendations for Other Services: Neuropsych consult Patient destination: Home Follow Up Recommendations: 24 hour supervision/assistance;Home health OT Equipment Recommended: To be determined   Skilled Therapeutic Intervention Session 1: Upon entering the room, pt supine in bed awaiting therapist with no c/o pain. Pt educated on OT purpose, POC, and goals with pt verbalizing understanding. Pt stating his goal as, " I want to be able to go to bathroom and walk again by myself." Pt required mod lifting assistance to come to standing and ambulated  with min hand held assistance to sink. Pt engaged in bathing and dressing tasks with sit  <>stand at sink. Pt with decreased standing tolerance of 2 minutes total for grooming tasks. Pt required multiple rest breaks and educated on pursed lip breathing secondary to fatigue. Pt remaining in wheelchair at end of session with call bell and all needed items within reach upon exiting the room.   Session 2: Upon entering the room, pt seated in wheelchair with sister present in room. Pt with no c/o pain this session but very anxious regarding upcoming drainage of catheter. Pt agreeable to OT intervention but with maximal encouragement for tasks. OT reviewed sternal precautions with pt and provided paper handout. Pt required min A to stand from wheelchair and ambulating 10' to bathroom without AD and needing min hand held assistance. OT managing O2. Pt performed toilet transfer onto standard toilet with mod lifting assistance. Once seated on toilet pt refusing to walk out of room without more assistance from therapist. OT encouraged pt to continue as he was being self limiting during session. Pt required increased time but returned to EOB in same manner as before. OT educating pt on energy conservation techniques and providing paper handouts regarding information. Pt returned to bed with min A with call bell and all needed items within reach upon exiting the room.   OT Evaluation Precautions/Restrictions  Precautions Precautions: Fall;Sternal Vital Signs Therapy Vitals Pulse Rate: 74 BP: 94/60 mmHg Oxygen Therapy SpO2: 95 % O2 Device: Nasal Cannula O2 Flow Rate (L/min): 1.5 L/min Pain Pain Assessment Pain Assessment: 0-10 Pain Score: 4  Pain Type: Acute pain;Surgical pain Pain Location: Chest Pain Descriptors / Indicators: Aching;Sore Pain Onset: On-going Patients Stated Pain Goal: 3 Pain Intervention(s): Repositioned;Distraction;Emotional support Multiple Pain Sites: No Home Living/Prior Functioning Home Living Family/patient expects to be discharged to:: Private residence Living  Arrangements: Children Available Help at Discharge: Family, Available 24 hours/day (will be going to sister's home, [place], where he will have 24/7 S) Type of Home: House Home Access: Stairs to enter [employer] of Steps: 2 Home Layout: One level Bathroom Shower/Tub: Optometrist: Yes  Lives With: Son Prior Function Level of Independence: Independent with basic ADLs, Independent with homemaking with ambulation, Independent with gait, Independent with transfers Vocation: On disability 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   Vision/Perception  Vision- History Baseline Vision/History: Wears glasses Wears Glasses: Reading only Patient Visual Report: No change from baseline Vision- Assessment Vision Assessment?: No apparent visual deficits  Cognition Overall Cognitive Status: Within Functional Limits for tasks assessed Arousal/Alertness: Awake/alert Orientation Level: Person;Place;Situation Person: Oriented Place: Oriented Situation: Oriented Year: 2017 Month: March Day of Week: Correct Memory: Appears intact Immediate Memory Recall: Sock;Blue;Bed Memory Recall: Sock;Blue;Bed Memory Recall Sock: Without Cue Memory Recall Blue: Without Cue Memory Recall Bed: Without Cue Safety/Judgment: Appears intact Sensation Sensation Light Touch: Appears Intact Stereognosis: Not tested Hot/Cold: Appears Intact Coordination Fine Motor Movements are Fluid and Coordinated: Yes Motor  Motor Motor: Within Functional Limits Motor - Skilled Clinical Observations: generalized weakness Mobility  Bed Mobility Bed Mobility: Supine to Sit Supine to Sit: 4: Min guard;HOB flat Transfers Sit to Stand: 3: Mod assist Sit to Stand Details: Verbal cues for safe use of DME/AE;Verbal cues for sequencing;Verbal cues for technique;Tactile cues for posture Stand to Sit: 3: Mod assist Stand to Sit  Details (indicate cue type and reason): Tactile cues for posture;Verbal cues for sequencing;Verbal cues for safe use of DME/AE;Verbal  cues for technique Stand to Sit Details: pt demos excessive rocking back/forth for momentum, PT educated on appropriate forward weight shift to maximize independence with transfer  Trunk/Postural Assessment  Cervical Assessment Cervical Assessment: Exceptions to [facility] (forward head) Thoracic Assessment Thoracic Assessment: Exceptions to [facility] (rounded shoulders) Lumbar Assessment Lumbar Assessment:  (posterior pelvic tilt) Postural Control Postural Control: Deficits on evaluation Protective Responses: delayed 2/2 pain  Balance Balance Balance Assessed: Yes Static Standing Balance Static Standing - Balance Support: Bilateral upper extremity supported Static Standing - Level of Assistance: 5: Stand by assistance Dynamic Standing Balance Dynamic Standing - Balance Support: Bilateral upper extremity supported Dynamic Standing - Level of Assistance: 4: Min assist Extremity/Trunk Assessment RUE Assessment RUE Assessment: Not tested (secondary to sternal precautions) LUE Assessment LUE Assessment: Not tested (secondary to sternal precautions)   See Function Navigator for Current Functional Status.   Refer to Care Plan for Long Term Goals  Recommendations for other services: Neuropsych  Discharge Criteria: Patient will be discharged from OT if patient refuses treatment 3 consecutive times without medical reason, if treatment goals not met, if there is a change in medical status, if patient makes no progress towards goals or if patient is discharged from hospital.  The above assessment, treatment plan, treatment alternatives and goals were discussed and mutually agreed upon: by patient  Phineas Semen 02/04/2016, 10:27 AM

## 2016-02-04 NOTE — Progress Notes (Signed)
  Subjective:  No complaints  Objective: Vital signs in last 24 hours: Temp:  [97.4 F (36.3 C)-97.7 F (36.5 C)] 97.4 F (36.3 C) (03/30 0431) Pulse Rate:  [73-85] 74 (03/30 0941) Cardiac Rhythm:  [-]  Resp:  [20] 20 (03/30 0431) BP: (94-122)/(58-73) 94/60 mmHg (03/30 0800) SpO2:  [95 %-99 %] 95 % (03/30 0719) Weight:  [75.3 kg (166 lb 0.1 oz)-78.6 kg (173 lb 4.5 oz)] 78.6 kg (173 lb 4.5 oz) (03/30 0430)  Hemodynamic parameters for last 24 hours:    Intake/Output from previous day: 03/29 0701 - 03/30 0700 In: -  Out: 1300 [Urine:1300] Intake/Output this shift:    General appearance: alert and cooperative Heart: irregularly irregular rhythm Lungs: diminished breath sounds bibasilar Extremities: edema moderate in legs Wound: incisions healing well  Lab Results:  Recent Labs  02/04/16 0344  WBC 9.6  HGB 10.2*  HCT 33.4*  PLT 179   BMET:  Recent Labs  02/04/16 0344  NA 132*  K 5.3*  CL 97*  CO2 30  GLUCOSE 98  BUN 24*  CREATININE 1.19  CALCIUM 8.3*    PT/INR:  Recent Labs  02/04/16 0344  LABPROT 25.1*  INR 2.31*   ABG    Component Value Date/Time   PHART 7.366 01/12/2016 1529   HCO3 33.4* 01/12/2016 1529   TCO2 41 01/21/2016 0849   ACIDBASEDEF 3.0* 01/11/2016 0728   O2SAT 53.2 01/28/2016 0622   CBG (last 3)  No results for input(s): GLUCAP in the last 72 hours.  Assessment/Plan:  He is doing well since going to rehab.  Vital signs stable. He says that pleurX catheters were drained yesterday but I can't find the amounts in the flow sheet.  He still has significant edema but expect this to gradually resolve as his nutrition improves and he has been putting out about 1L daily from the catheters which will also remove fluid.  K+ up a little and he has tended to have hyperkalemia which improved after we stopped Ensure.   LOS: 1 day    Alleen BorneBryan K Bartle 02/04/2016

## 2016-02-04 NOTE — Progress Notes (Signed)
Patient information reviewed and entered into eRehab system by Angell Honse, RN, CRRN, PPS Coordinator.  Information including medical coding and functional independence measure will be reviewed and updated through discharge.    

## 2016-02-05 ENCOUNTER — Inpatient Hospital Stay (HOSPITAL_COMMUNITY): Payer: Medicaid Other | Admitting: Occupational Therapy

## 2016-02-05 ENCOUNTER — Inpatient Hospital Stay (HOSPITAL_COMMUNITY): Payer: Self-pay | Admitting: Physical Therapy

## 2016-02-05 LAB — CBC WITH DIFFERENTIAL/PLATELET
BASOS PCT: 3 %
Basophils Absolute: 0.3 10*3/uL — ABNORMAL HIGH (ref 0.0–0.1)
EOS ABS: 0.2 10*3/uL (ref 0.0–0.7)
EOS PCT: 2 %
HEMATOCRIT: 32.5 % — AB (ref 39.0–52.0)
HEMOGLOBIN: 10.5 g/dL — AB (ref 13.0–17.0)
LYMPHS PCT: 16 %
Lymphs Abs: 1.4 10*3/uL (ref 0.7–4.0)
MCH: 31.6 pg (ref 26.0–34.0)
MCHC: 32.3 g/dL (ref 30.0–36.0)
MCV: 97.9 fL (ref 78.0–100.0)
Monocytes Absolute: 0.8 10*3/uL (ref 0.1–1.0)
Monocytes Relative: 9 %
NEUTROS ABS: 6 10*3/uL (ref 1.7–7.7)
Neutrophils Relative %: 70 %
Platelets: 175 10*3/uL (ref 150–400)
RBC: 3.32 MIL/uL — ABNORMAL LOW (ref 4.22–5.81)
RDW: 18 % — ABNORMAL HIGH (ref 11.5–15.5)
WBC: 8.7 10*3/uL (ref 4.0–10.5)

## 2016-02-05 LAB — BASIC METABOLIC PANEL
Anion gap: 6 (ref 5–15)
BUN: 20 mg/dL (ref 6–20)
CHLORIDE: 97 mmol/L — AB (ref 101–111)
CO2: 30 mmol/L (ref 22–32)
CREATININE: 1.16 mg/dL (ref 0.61–1.24)
Calcium: 8.4 mg/dL — ABNORMAL LOW (ref 8.9–10.3)
GFR calc Af Amer: >60 mL/min (ref >60)
GFR calc non Af Amer: >60 mL/min (ref >60)
GLUCOSE: 102 mg/dL — AB (ref 65–99)
Potassium: 5.3 mmol/L — ABNORMAL HIGH (ref 3.5–5.1)
SODIUM: 133 mmol/L — AB (ref 135–145)

## 2016-02-05 LAB — PROTIME-INR
INR: 2.66 — ABNORMAL HIGH (ref 0.00–1.49)
PROTHROMBIN TIME: 27.9 s — AB (ref 11.6–15.2)

## 2016-02-05 MED ORDER — WARFARIN SODIUM 1 MG PO TABS
1.0000 mg | ORAL_TABLET | Freq: Once | ORAL | Status: AC
  Administered 2016-02-05: 1 mg via ORAL
  Filled 2016-02-05: qty 1

## 2016-02-05 NOTE — Progress Notes (Signed)
[facility]     PROGRESS NOTE  Subjective/Complaints:  Patient seen sitting up in bed this morning. He states he had a good first in therapies yesterday and is ready to start again today.  ROS: + DOE. Denies CP, SOB, N/V/D  Objective: Vital Signs: Blood pressure 108/62, pulse 80, temperature 97.6 F (36.4 C), temperature source Oral, resp. rate 20, height 5\' 9"  (1.753 m), weight 73.4 kg (161 lb 13.1 oz), SpO2 98 %. No results found.  Recent Labs  02/04/16 0344 02/05/16 0520  WBC 9.6 8.7  HGB 10.2* 10.5*  HCT 33.4* 32.5*  PLT 179 175    Recent Labs  02/04/16 0344 02/05/16 0520  NA 132* 133*  K 5.3* 5.3*  CL 97* 97*  GLUCOSE 98 102*  BUN 24* 20  CREATININE 1.19 1.16  CALCIUM 8.3* 8.4*   CBG (last 3)  No results for input(s): GLUCAP in the last 72 hours.  Wt Readings from Last 3 Encounters:  02/05/16 73.4 kg (161 lb 13.1 oz)  02/03/16 75.1 kg (165 lb 9.1 oz)  03/31/15 66.951 kg (147 lb 9.6 oz)    Physical Exam:  BP 108/62 mmHg  Pulse 80  Temp(Src) 97.6 F (36.4 C) (Oral)  Resp 20  Ht 5\' 9"  (1.753 m)  Wt 73.4 kg (161 lb 13.1 oz)  BMI 23.89 kg/m2  SpO2 98% Constitutional: He appears well-developed and well-nourished. Nasal cannula in place. NAD HENT: Normocephalic and atraumatic.  Eyes: Conjunctivae and EOM are normal. Pupils are equal, round, and reactive to light. No scleral icterus.  Cardiovascular: An irregularly irregular rhythm present.   Respiratory: No respiratory distress. Unlabored breathing. He has rhonchi in the right lower field and the left lower field. He exhibits no tenderness.  GI: Soft. Bowel sounds are normal. He exhibits no distension. There is no tenderness.  Musculoskeletal: He exhibits edema.  2+ B/l LE. Diffuse ecchymosis bilateral shins down to ankles.  Neurological: He is alert and oriented.  Speech clear.  Motor: Proximally 4/5, distally 5/5  Skin: Skin is warm and dry.  Midline chest  incision clean, dry and intact. Torso with dry dressing covering tubing bilaterally.  Psychiatric: His speech is normal. Cognition and memory are not impaired. Behavior and affect are normal.  Assessment/Plan: 1. Functional deficits secondary to sepsis and NSTEMI with subsequent CABG/ascending aneurysm repair which require 3+ hours per day of interdisciplinary therapy in a comprehensive inpatient rehab setting. Physiatrist is providing close team supervision and 24 hour management of active medical problems listed below. Physiatrist and rehab team continue to assess barriers to discharge/monitor patient progress toward functional and medical goals.  Function:  Bathing Bathing position   Position: Wheelchair/chair at sink  Bathing parts Body parts bathed by patient: Right arm, Left arm, Chest, Abdomen, Front perineal area, Right upper leg, Left upper leg, Right lower leg, Left lower leg Body parts bathed by helper: Back, Buttocks  Bathing assist Assist Level: Touching or steadying assistance(Pt > 75%)      Upper Body Dressing/Undressing Upper body dressing   What is the patient wearing?: Hospital gown     Pull over shirt/dress - Perfomed by patient: Thread/unthread right sleeve, Thread/unthread left sleeve, Put head through opening          Upper body assist Assist Level: Set up   Set up : To obtain clothing/put away  Lower Body Dressing/Undressing Lower body dressing   What is the patient wearing?: Non-skid slipper socks, Pants  Pants- Performed by patient: Thread/unthread right pants leg, Thread/unthread left pants leg, Pull pants up/down   Non-skid slipper socks- Performed by patient: Don/doff right sock, Don/doff left sock                    Lower body assist Assist for lower body dressing: Touching or steadying assistance (Pt > 75%)   Set up : To obtain clothing/put away  Toileting Toileting   Toileting steps completed by patient: Adjust clothing prior to  toileting, Performs perineal hygiene, Adjust clothing after toileting   Toileting Assistive Devices: Grab bar or rail  Toileting assist Assist level: Touching or steadying assistance (Pt.75%)   Transfers Chair/bed transfer   Chair/bed transfer method: Stand pivot Chair/bed transfer assist level: Moderate assist (Pt 50 - 74%/lift or lower) Chair/bed transfer assistive device: Armrests, Patent attorney     Max distance: 75 Assist level: Touching or steadying assistance (Pt > 75%)   Wheelchair   Type: Manual Max wheelchair distance: 160 Assist Level: Dependent (Pt equals 0%) (sternal precautions)  Cognition Comprehension Comprehension assist level: Follows complex conversation/direction with extra time/assistive device  Expression Expression assist level: Expresses complex ideas: With extra time/assistive device  Social Interaction Social Interaction assist level: Interacts appropriately with others with medication or extra time (anti-anxiety, antidepressant).  Problem Solving Problem solving assist level: Solves complex 90% of the time/cues < 10% of the time  Memory Memory assist level: Assistive device: No helper    Medical Problem List and Plan: 1. Debility secondary to sepsis and NSTEMI with subsequent CABG/ascending aneurysm repair.   Continue CIR 2. DVT Prophylaxis/Anticoagulation: Pharmaceutical: Coumadin  INR therapeutic today.  3. Pain Management: Continue oxycodone prn.  4. Mood: Team to provide ego support. LCSW to follow for evaluation and support.  5. Neuropsych: This patient is capable of making decisions on his own behalf. 6. Skin/Wound Care: Monitor nutrition and hydration status.  7. Fluids/Electrolytes/Nutrition: Monitor I/O.   Hyponatremia: 133 on 3/31 (stable)  Hypokalemia: 5.3 on 3/31. Will continue to monitor 8. CAD s/p CABG x 4: To continue sternal precautions. On ASA, BB--no statin due to shocked liver.  9. A fib: Monitor  heart rate bid and with activity. Continue coumadin, metoprolol and digoxin.  10. COPD: On spiriva, Pulmicort nebs and Mucinex bid. Oxygen dependent at this time.  11. Leucocytosis: Resolved   Monitor for signs of infection.   Completed antibiotics 03/28.  12. Thrombocytopenia: Continue to monitor platelets. Has been off heparin since surgery.   13. ABLA: Stable.   Hemoglobin 10.5 on 3/31  Will continue to monitor  14. Hypoalbuminemia  Supplementation started on 3/30   LOS (Days) 2 A FACE TO FACE EVALUATION WAS PERFORMED  Ankit Karis Juba 02/05/2016 9:45 AM

## 2016-02-05 NOTE — Progress Notes (Signed)
Occupational Therapy Session Note  Patient Details  Name: Craig [name] MRN: [id number] Date of Birth: 07/19/1951  Today's Date: 02/05/2016 OT Individual Time:  -   0915-1030  (75 min)  (1st session)                                                   1300-1400  (60 min)  2nd session)      Short Term Goals: Week 1:  OT Short Term Goal 1 (Week 1): STGs=LTGS secondary to estimated LOS Week 2:     Skilled Therapeutic Interventions/Progress Updates:    1st session:  .Precautions: sternal.:  Pt. Recalled 3/3 sternal precautions.  Performed bathing in bed with HOB up lying in bed with no c/o pain.  Enaged in LE raises and ankles pumps for increased blood flow and decreased swelling while donning pants. Pt fatigues quickly and showed decreased strength and endurance.  Pt performed Stand/pivot from EOB>w/c with mod assist to rise .  Pt performed grooming at sink with set up assist. Pt.left in room with all items in reach.     2nd session:   Pt engaged in activity tolerance, standing balance, functional transfers,  Propelled wc to ADL apt with max assist.  Utilized rollator walker and simulated cooking activity with opening cabinets, refrigerator, Pt became more fatigued with reaching activities.  Pt. Taken back to room.  Transferred to bed and left with all needs in reach.    Therapy Documentation Precautions:  Precautions Precautions: Fall, Sternal Precaution Comments: pt able to recall 2/3 sternal precautions and 3/3 with min question cues Restrictions Weight Bearing Restrictions: Yes Other Position/Activity Restrictions: sternal precautions    Pain: none          See Function Navigator for Current Functional Status.   Therapy/Group: Individual Therapy  Craig [name], Craig [name] 02/05/2016, 7:57 AM

## 2016-02-05 NOTE — Progress Notes (Signed)
Social Work Assessment and Plan  Patient Details  Name: Craig [name] MRN: [id number] Date of Birth: 1951-09-16  Today's Date: 02/05/2016  Problem List:  Patient Active Problem List   Diagnosis Date Noted  . Hyperkalemia   . Hypoalbuminemia due to protein-calorie malnutrition ([place])   . Debility 02/03/2016  . History of sepsis   . History of non-ST elevation myocardial infarction (NSTEMI)   . Coronary artery disease involving coronary bypass graft of native heart without angina pectoris   . History of aortic aneurysm repair   . Chronic obstructive pulmonary disease ([place])   . Leukocytosis   . Hyponatremia   . Thrombocytopenia ([place])   . Acute blood loss anemia   . Sepsis ([place])   . S/P CABG (coronary artery bypass graft)   . Paroxysmal atrial fibrillation ([facility])   . S/P CABG x 4   . Pleural effusion   . Acute respiratory acidosis   . Acute renal failure (ARF) ([place])   . Severe aortic stenosis   . Coronary artery disease involving native coronary artery of native heart without angina pectoris   . Coronary artery disease involving native coronary artery of native heart with unstable angina pectoris ([place])   . Acute respiratory failure ([place])   . CAD (coronary artery disease)   . Aortic stenosis   . Acute respiratory failure with hypoxia ([place]) 12/27/2015  . Elevated troponin   . History of hypertension   . History of hyperlipidemia   . Ascending aortic aneurysm ([place])   . CAP (community acquired pneumonia) 12/26/2015  . NSTEMI (non-ST elevated myocardial infarction) ([place]) 12/26/2015  . COPD (chronic obstructive pulmonary disease) ([place]) 12/26/2015  . Hypoxia 12/26/2015  . Ruptured lumbar disc 02/17/2015  . Back pain 02/17/2015   Past Medical History:  Past Medical History  Diagnosis Date  . COPD (chronic obstructive pulmonary disease) ([place])   . Hypertension   . Hyperlipidemia   . Alcohol abuse     Prior  . Tobacco abuse     Prior   Past Surgical History:  Past  Surgical History  Procedure Laterality Date  . Ruptured disc repair  2009 or 2010  . Cardiac catheterization N/A 12/29/2015    Procedure: Right/Left Heart Cath and Coronary Angiography;  Surgeon: Peter M Martinique, MD;  Location: [facility];  Service: Cardiovascular;  Laterality: N/A;  . Coronary artery bypass graft N/A 01/07/2016    Procedure: CORONARY ARTERY BYPASS GRAFTING (CABG), ON PUMP, TIMES FOUR, USING LEFT INTERNAL MAMMARY ARTERY, BILATERAL GREATER SAPHENOUS VEINS HARVESTED ENDOSCOPICALLY;  Surgeon: Gaye Pollack, MD;  Location: [facility];  Service: Open Heart Surgery;  Laterality: N/A;  LIMA to LAD, SVG to OM, SVG SEQUENTIALLY to PDA and PLB  . Tee without cardioversion N/A 01/07/2016    Procedure: TRANSESOPHAGEAL ECHOCARDIOGRAM (TEE);  Surgeon: Gaye Pollack, MD;  Location: [facility];  Service: Open Heart Surgery;  Laterality: N/A;  . Thoracic aortic aneurysm repair N/A 01/07/2016    Procedure: CIRC ARREST AND REPLACEMENT OF ASCENDING THORACIC  ANEURYSM;  Surgeon: Gaye Pollack, MD;  Location: [facility];  Service: Open Heart Surgery;  Laterality: N/A;  . Chest tube insertion Bilateral 01/25/2016    Procedure: INSERTION Bilateral PLEURAL DRAINAGE CATHETER;  Surgeon: Gaye Pollack, MD;  Location: [facility];  Service: Thoracic;  Laterality: Bilateral;   Social History:  reports that he has quit smoking. He has never used smokeless tobacco. He reports that he does not drink alcohol or use illicit drugs.  Family / Support Systems Marital Status: Separated How Long?: 3 years - was married for 5 years Patient Roles: Parent, Other (Comment) (brother) Children: Craig [name] - 3 y/o son - [phone]; adult children - not very involved Other Supports: Craig [name] - sister - [phone] Anticipated Caregiver: sister, Craig [name] Ability/Limitations of Caregiver: Craig [name] has no limitations Caregiver Availability: 24/7 Family Dynamics: close, supportive siblings  Social History Preferred language:  English Religion: Baptist Education: GED Read: Yes Write: Yes Employment Status: Retired Date Retired/Disabled/Unemployed: 2016 Age Retired: 46 Legal History/Current Legal Issues: none reported Guardian/Conservator: N/A - MD has determined that pt is able to make his own decisions.   Abuse/Neglect Physical Abuse: Denies Verbal Abuse: Denies Sexual Abuse: Denies Exploitation of patient/patient's resources: Denies Self-Neglect: Denies  Emotional Status Pt's affect, behavior and adjustment status: Pt reports feeling okay emotionally and is grateful that the heart condition was found. Recent Psychosocial Issues: Pt's mother died while he was hospitalized, but pt reports family is at peace with it as his mother was ready to go "home" and was not happy living in the nursing facility. Psychiatric History: none reported Substance Abuse History: none reported, but chart notes alcohol abuse - CSW will f/u with pt on this  Patient / Family Perceptions, Expectations & Goals Pt/Family understanding of illness & functional limitations: Pt/sister have a good understanding of pt's condition.  No concerns/questions at this time.   Premorbid pt/family roles/activities: Pt likes to be outside - fishing, hunting, [place], walks, playing with the dogs.  Pt also goes to breakfast at [employer] sometimes. Anticipated changes in roles/activities/participation: Pt hopes to resume activities as he is able. Pt/family expectations/goals: Pt wants to regain strength and get home to his son when he is safe to do so.  Community Resources Express Scripts: None Premorbid Home Care/DME Agencies: None Transportation available at discharge: family Resource referrals recommended: Neuropsychology  Discharge Planning Living Arrangements: Children Support Systems: Children, Other relatives, Friends/neighbors Type of Residence: Private residence Insurance Resources: Self-pay (per pt, he and sister have applied for  Medicaid with Development worker, community) Museum/gallery curator Resources: Radio broadcast assistant Screen Referred: Previously completed Living Expenses: Rent (from his brother) Money Management: Patient Does the patient have any problems obtaining your medications?: Yes (Describe) (insurance lapsed at some point - CSW to provide MATCH to pt) Home Management: Pt was doing this, but family will assist while pt is unable to perform home management tasks. Patient/Family Preliminary Plans: Pt plans to go to his sister's home for a bit prior to returning to his home with his son. Barriers to Discharge: Steps Social Work Anticipated Follow Up Needs: HH/OP Expected length of stay: 7 to 10 days  Clinical Impression CSW met with pt to introduce self and role of CSW, as well as to complete assessment.  CSW spoke with pt's sister via telephone to do the same.  Pt is glad his heart condition was discovered and that saved him.  He is happy to be on rehab, but will be glad to be home.  Pt has good family support from his siblings and son and he will stay with his sister until he is safe to be on his own.  Son is staying with pt's brother while pt is admitted.  Pt has two dogs and enjoys being outside.  Pt had to retire and he is making ends meet, but just has to be very careful with money.  He was caught off guard when he was admitted to the hospital that his insurance  had termed.  He will look into this further after d/c, but he and his sister have applied pt for medicaid.  CSW will continue to follow and assist pt as needed.  Craig [name], Silvestre Mesi 02/05/2016, 9:23 AM

## 2016-02-05 NOTE — Progress Notes (Signed)
Physical Therapy Session Note  Patient Details  Name: Craig [name] MRN: [id number] Date of Birth: 1951-09-19  Today's Date: 02/05/2016 PT Individual Time: 1100-1200 PT Individual Time Calculation (min): 60 min   Short Term Goals: Week 1:  PT Short Term Goal 1 (Week 1): =LTGs due to ELOS  Skilled Therapeutic Interventions/Progress Updates:    Pt received resting in w/c, no c/o pain and agreeable to therapy session.  Stand/pivot to Nustep with mod assist for lifting and verbal cues for safety.  Nustep x8 minutes at level 1 with pt working ~34-40 steps/minute for overall endurance and LE strengthening with verbal cues for full knee extension and pursed lip breathing.  Sit>stand from nustep with mod assist to lift and verbal cues for forward weight shift and head up.  Gait training x20' to therapy mat with rollator and steady assist.  PT instructed pt in sit<>stand with focus on forward weight shift and activation of glutes.  Pt only willing to perform sit<>stand x2 trials with extended seated rest break in between.  Gait training x90' on second standing trial with rollator and light steady assist.  Pt stating he does not wish to continue gait training and requesting to sit down.  Stand/pivot back to w/c after seated rest break and PT instructed pt in BLE therex 2x10 reps with 2# ankle weights LAQ, hip abd with level 2 theraband, and isometric hip add with ball.  PT provided education on correct form and breathing throughout therex, which pt performed slowly (20 minutes to perform three exercises 2x10 reps).  Pt demonstrating need for increased lifting assist today compared to yesterday and pt reporting increased pain in mid back during session today, but does not rate.  Pt returned to room in w/c at end of session and positioned with call bell in reach and needs met.   Therapy Documentation Precautions:  Precautions Precautions: Fall, Sternal Precaution Comments: pt able to recall 2/3 sternal  precautions and 3/3 with min question cues Restrictions Weight Bearing Restrictions: Yes Other Position/Activity Restrictions: sternal precautions   See Function Navigator for Current Functional Status.   Therapy/Group: Individual Therapy  Earnest Conroy Penven-Crew 02/05/2016, 12:09 PM

## 2016-02-05 NOTE — Progress Notes (Signed)
Inpatient Rehabilitation Center Individual Statement of Services  [facility]  [name]  Date:  02/05/2016  Welcome to the Inpatient Rehabilitation Center.  Our goal is to provide you with an individualized program based on your diagnosis and situation, designed to meet your specific needs.  With this comprehensive rehabilitation program, you will be expected to participate in at least 3 hours of rehabilitation therapies Monday-Friday, with modified therapy programming on the weekends.  Your rehabilitation program will include the following services:  Physical Therapy (PT), Occupational Therapy (OT), 24 hour per day rehabilitation nursing, Neuropsychology, Case Management (Social Worker), Rehabilitation Medicine, Nutrition Services and Pharmacy Services  Weekly team conferences will be held on Wednesdays to discuss your progress.  Your Social Worker will talk with you frequently to get your input and to update you on team discussions.  Team conferences with you and your family in attendance may also be held.  Expected length of stay: 7 to 10 days  Overall anticipated outcome: Supervision with minimal assistance on stairs  Depending on your progress and recovery, your program may change. Your Social Worker will coordinate services and will keep you informed of any changes. Your Social Worker's name and contact numbers are listed  below.  The following services may also be recommended but are not provided by the Inpatient Rehabilitation Center:   Driving Evaluations  Home Health Rehabiltiation Services  Outpatient Rehabilitation Services   Arrangements will be made to provide these services after discharge if needed.  Arrangements include referral to agencies that provide these services.  Your insurance has been verified to be:  Your BCBS ended without your knowledge and you/your sister applied for Medicaid [facility]  Pertinent  information will be shared with your doctor and your insurance company.  Social Worker:  Staci AcostaJenny Zyanna Leisinger, Craig [name]  [phone] or ([phone]) [phone]  Information discussed with and copy given to patient by: Elvera LennoxPrevatt, [place] Noyce Capps, 02/05/2016, 9:30 AM

## 2016-02-05 NOTE — Progress Notes (Signed)
ANTICOAGULATION CONSULT NOTE - Follow up  Pharmacy Consult:  Coumadin Indication: atrial fibrillation  No Known Allergies  Patient Measurements: Height: 5\' 9"  (175.3 cm) Weight: 161 lb 13.1 oz (73.4 kg) IBW/kg (Calculated) : 70.7  Vital Signs: Temp: 97.6 F (36.4 C) (03/31 0500) Temp Source: Oral (03/31 0500) BP: 108/62 mmHg (03/31 0918) Pulse Rate: 80 (03/31 0918)  Labs:  Recent Labs  02/03/16 0620 02/04/16 0344 02/05/16 0520  HGB  --  10.2* 10.5*  HCT  --  33.4* 32.5*  PLT  --  179 175  LABPROT 23.4* 25.1* 27.9*  INR 2.10* 2.31* 2.66*  CREATININE  --  1.19 1.16    Estimated Creatinine Clearance: 64.3 mL/min (by C-G formula based on Cr of 1.16).   Medical History: Past Medical History  Diagnosis Date  . COPD (chronic obstructive pulmonary disease) ([facility])   . Hypertension   . Hyperlipidemia   . Alcohol abuse     Prior  . Tobacco abuse     Prior       Assessment: [age] YOM transferred to Rehab on 02/03/16 s/p admission for sepsis and NSTEMI post CABG and pericardial AVR.  MD had been managing Coumadin for Afib prior to transfer to Rehab Unit.  Transition to Pharmacy dosing protocol upon Rehab transfer.  INR is  2.66 today, just above our therapeutic goal of 2-2.5 per MD. Hgb 10.5, pltc 175 K, stable,  No bleeding reported   Goal of Therapy:  INR 2-2.5 (per Dr. Sharee PimpleBartle's note on 3/13)   Plan:  - Coumadin 1mg  PO daily - Daily PT / INR   Craig [name], RPh Clinical Pharmacist Pager: [phone] 02/05/2016, 12:22 PM

## 2016-02-06 ENCOUNTER — Inpatient Hospital Stay (HOSPITAL_COMMUNITY): Payer: Self-pay | Admitting: Physical Therapy

## 2016-02-06 ENCOUNTER — Inpatient Hospital Stay (HOSPITAL_COMMUNITY): Payer: Self-pay

## 2016-02-06 LAB — PROTIME-INR
INR: 2.22 — AB (ref 0.00–1.49)
Prothrombin Time: 24.4 seconds — ABNORMAL HIGH (ref 11.6–15.2)

## 2016-02-06 MED ORDER — WARFARIN SODIUM 2.5 MG PO TABS
2.5000 mg | ORAL_TABLET | Freq: Once | ORAL | Status: AC
  Administered 2016-02-06: 2.5 mg via ORAL
  Filled 2016-02-06: qty 1

## 2016-02-06 NOTE — Progress Notes (Signed)
Physical Therapy Session Note  Patient Details  Name: EFSTATHIOS SAWIN MRN: [id number] Date of Birth: [birth year]  Today's Date: 02/06/2016 PT Individual Time: 4171-2787 PT Individual Time Calculation (min): 27 min   Short Term Goals: Week 1:  PT Short Term Goal 1 (Week 1): =LTGs due to ELOS  Skilled Therapeutic Interventions/Progress Updates:    Patient received semirecumbent in bed, and agreeable to PT. Bed mobility with Supervision from PT for supine to Sit with cues to minimize push through arms. Patient performed sit to stand with min A from bed with out pushing through arms. Gait training for 115f with rollator and supervision A. Rest break, SpO2 checked 97%, HR 89bpm. Sit>stand following rest break with ModA from PT from standard seat height.  Gait training with rollator for 2222f supervision A from PT. Patient performed sit to supine with supervision A. Patient left in bed with call bell within reach and all other needs met.   Therapy Documentation Precautions:  Precautions Precautions: Fall, Sternal Precaution Comments: pt able to recall 2/3 sternal precautions and 3/3 with min question cues Restrictions Weight Bearing Restrictions: Yes Other Position/Activity Restrictions: sternal precautions General: PT Amount of Missed Time (min): 18 Minutes PT Missed Treatment Reason: Other (Comment) (schedule conflict) Vital Signs:   Pain: Pain Assessment Pain Assessment: 0-10 Pain Score:  (premed per pt request for pleurix xhange at 3pm)  See Function Navigator for Current Functional Status.   Therapy/Group: Individual Therapy  AuLorie Phenix/11/2015, 3:07 PM

## 2016-02-06 NOTE — Progress Notes (Signed)
Patient A/O, no noted distress. BLE edema. Denies pain. Patient slept well throughout the shift. Tolerated meds well. Cont B/B. Patient notes he is ready to go home and feels he will be going home some. Staff will continue to monitor and meet needs.

## 2016-02-06 NOTE — Progress Notes (Signed)
[facility] PHYSICAL MEDICINE & REHABILITATION     PROGRESS NOTE  Subjective/Complaints:  Had a good bowel movement on the commode. He ambulated with a walker and nurses aide to bathroom this morning  ROS: + DOE. Denies CP, SOB, N/V/D  Objective: Vital Signs: Blood pressure 113/67, pulse 76, temperature 97.9 F (36.6 C), temperature source Oral, resp. rate 18, height  (1.753 m), weight 73.2 kg (161 lb 6 oz), SpO2 98 %. No results found.  Recent Labs  02/04/16 0344 02/05/16 0520  WBC 9.6 8.7  HGB 10.2* 10.5*  HCT 33.4* 32.5*  PLT 179 175    Recent Labs  02/04/16 0344 02/05/16 0520  NA 132* 133*  K 5.3* 5.3*  CL 97* 97*  GLUCOSE 98 102*  BUN 24* 20  CREATININE 1.19 1.16  CALCIUM 8.3* 8.4*   CBG (last 3)  No results for input(s): GLUCAP in the last 72 hours.  Wt Readings from Last 3 Encounters:  02/06/16 73.2 kg (161 lb 6 oz)  02/03/16 75.1 kg (165 lb 9.1 oz)  03/31/15 66.951 kg (147 lb 9.6 oz)    Physical Exam:  BP 113/67 mmHg  Pulse 76  Temp(Src) 97.9 F (36.6 C) (Oral)  Resp 18  Ht  (1.753 m)  Wt 73.2 kg (161 lb 6 oz)  BMI 23.82 kg/m2  SpO2 98% Constitutional: He appears well-developed and well-nourished. Nasal cannula in place. NAD HENT: Normocephalic and atraumatic.  Eyes: Conjunctivae and EOM are normal. Pupils are equal, round, and reactive to light. No scleral icterus.  Cardiovascular: An irregularly irregular rhythm present.   Respiratory: No respiratory distress. Unlabored breathing. He has rhonchi in the right lower field and the left lower field. He exhibits no tenderness.  GI: Soft. Bowel sounds are normal. He exhibits no distension. There is no tenderness.  Musculoskeletal: He exhibits edema.  2+ B/l LE. Diffuse ecchymosis bilateral shins down to ankles.  Neurological: He is alert and oriented.  Speech clear.  Motor: Proximally 4/5, distally 5/5  Skin: Skin is warm and dry.  Midline chest incision clean, dry and intact. Torso  with dry dressing covering tubing bilaterally.  Psychiatric: His speech is normal. Cognition and memory are not impaired. Behavior and affect are normal.  Assessment/Plan: 1. Functional deficits secondary to sepsis and NSTEMI with subsequent CABG/ascending aneurysm repair which require 3+ hours per day of interdisciplinary therapy in a comprehensive inpatient rehab setting. Physiatrist is providing close team supervision and 24 hour management of active medical problems listed below. Physiatrist and rehab team continue to assess barriers to discharge/monitor patient progress toward functional and medical goals.  Function:  Bathing Bathing position   Position: Wheelchair/chair at sink  Bathing parts Body parts bathed by patient: Right arm, Left arm, Chest, Abdomen, Front perineal area, Right upper leg, Left upper leg, Right lower leg, Left lower leg Body parts bathed by helper: Back, Buttocks  Bathing assist Assist Level: Supervision or verbal cues      Upper Body Dressing/Undressing Upper body dressing   What is the patient wearing?: Pull over shirt/dress     Pull over shirt/dress - Perfomed by patient: Thread/unthread right sleeve, Thread/unthread left sleeve, Put head through opening          Upper body assist Assist Level: Set up   Set up : To obtain clothing/put away  Lower Body Dressing/Undressing Lower body dressing   What is the patient wearing?: Pants, Non-skid slipper socks     Pants- Performed by patient: Thread/unthread right  pants leg, Thread/unthread left pants leg, Pull pants up/down   Non-skid slipper socks- Performed by patient: Don/doff right sock, Don/doff left sock                    Lower body assist Assist for lower body dressing: Supervision or verbal cues   Set up : To obtain clothing/put away  Toileting Toileting   Toileting steps completed by patient: Adjust clothing prior to toileting, Performs perineal hygiene, Adjust clothing after  toileting   Toileting Assistive Devices: Grab bar or rail  Toileting assist Assist level: Touching or steadying assistance (Pt.75%)   Transfers Chair/bed transfer   Chair/bed transfer method: Stand pivot Chair/bed transfer assist level: Moderate assist (Pt 50 - 74%/lift or lower) Chair/bed transfer assistive device: Armrests     Locomotion Ambulation     Max distance: 90 Assist level: Touching or steadying assistance (Pt > 75%)   Wheelchair   Type: Manual Max wheelchair distance: 160 Assist Level: Dependent (Pt equals 0%) (sternal precautions)  Cognition Comprehension Comprehension assist level: Follows complex conversation/direction with extra time/assistive device  Expression Expression assist level: Expresses complex ideas: With extra time/assistive device  Social Interaction Social Interaction assist level: Interacts appropriately with others with medication or extra time (anti-anxiety, antidepressant).  Problem Solving Problem solving assist level: Solves complex 90% of the time/cues < 10% of the time  Memory Memory assist level: Assistive device: No helper    Medical Problem List and Plan: 1. Debility secondary to sepsis and NSTEMI with subsequent CABG/ascending aneurysm repair.   Continue CIR 2. DVT Prophylaxis/Anticoagulation: Pharmaceutical: Coumadin  INR pnd 3. Pain Management: Continue oxycodone prn. Avoid Tylenol products due to shock liver 4. Mood: Team to provide ego support. LCSW to follow for evaluation and support.  5. Neuropsych: This patient is capable of making decisions on his own behalf. 6. Skin/Wound Care: Monitor nutrition and hydration status.  7. Fluids/Electrolytes/Nutrition: Monitor I/O.   Hyponatremia: 133 on 3/31 (stable)  Hypokalemia: 5.3 on 3/31. Will continue to monitor 8. CAD s/p CABG x 4: To continue sternal precautions. On ASA, BB--no statin due to shocked liver.  9. A fib: Monitor heart rate bid and with activity. Continue  coumadin, metoprolol and digoxin.  10. COPD: On spiriva, Pulmicort nebs and Mucinex bid. Oxygen dependent at this time.  11. Leucocytosis: Resolved   Monitor for signs of infection.   Completed antibiotics 03/28.  12. Thrombocytopenia: Continue to monitor platelets. Has been off heparin since surgery.   13. ABLA: Stable.   Hemoglobin 10.5 on 3/31  Will continue to monitor  14. Hypoalbuminemia  Supplementation started on 3/30   LOS (Days) 3 A FACE TO FACE EVALUATION WAS PERFORMED  Erick ColaceKIRSTEINS,ANDREW E 02/06/2016 10:37 AM

## 2016-02-06 NOTE — Progress Notes (Signed)
ANTICOAGULATION CONSULT NOTE - Follow up  Pharmacy Consult:  Coumadin Indication: atrial fibrillation  No Known Allergies  Patient Measurements: Height: 5\' 9"  (175.3 cm) Weight: 161 lb 6 oz (73.2 kg) IBW/kg (Calculated) : 70.7  Vital Signs: Temp: 97.9 F (36.6 C) (04/01 0524) Temp Source: Oral (04/01 0524) BP: 113/67 mmHg (04/01 0524) Pulse Rate: 76 (04/01 0524)  Labs:  Recent Labs  02/04/16 0344 02/05/16 0520 02/06/16 0559  HGB 10.2* 10.5*  --   HCT 33.4* 32.5*  --   PLT 179 175  --   LABPROT 25.1* 27.9* 24.4*  INR 2.31* 2.66* 2.22*  CREATININE 1.19 1.16  --     Estimated Creatinine Clearance: 64.3 mL/min (by C-G formula based on Cr of 1.16).   Medical History: Past Medical History  Diagnosis Date  . COPD (chronic obstructive pulmonary disease) ([facility])   . Hypertension   . Hyperlipidemia   . Alcohol abuse     Prior  . Tobacco abuse     Prior       Assessment: [age] YOM transferred to Rehab on 02/03/16 s/p admission for sepsis and NSTEMI post CABG and pericardial AVR. Pharmacy consulted to dose warfarin.  INR therapeutic at 2.22 today. Last Hgb 10.5, plt wnl. No bleeding noted. Previously on fluconazole from 3/17-3/23.    Goal of Therapy:  INR 2-2.5    Plan:  - Coumadin 2.5 mg PO daily - INR  - Monitor s/sx bleeding    Hillery AldoElizabeth Lanelle Lindo, Pharm.D., BCPS PGY2 Cardiology Pharmacy Resident Pager: [phone] 02/06/2016, 10:45 AM

## 2016-02-07 ENCOUNTER — Inpatient Hospital Stay (HOSPITAL_COMMUNITY): Payer: Self-pay | Admitting: Occupational Therapy

## 2016-02-07 ENCOUNTER — Inpatient Hospital Stay (HOSPITAL_COMMUNITY): Payer: Medicaid Other | Admitting: Physical Therapy

## 2016-02-07 LAB — PROTIME-INR
INR: 2.24 — ABNORMAL HIGH (ref 0.00–1.49)
Prothrombin Time: 24.5 seconds — ABNORMAL HIGH (ref 11.6–15.2)

## 2016-02-07 MED ORDER — WARFARIN SODIUM 2.5 MG PO TABS
2.5000 mg | ORAL_TABLET | Freq: Once | ORAL | Status: AC
  Administered 2016-02-07: 2.5 mg via ORAL
  Filled 2016-02-07: qty 1

## 2016-02-07 NOTE — Progress Notes (Signed)
Occupational Therapy Session Note  Patient Details  Name: Craig [name] MRN: [id number] Date of Birth: 07/29/1951  Today's Date: 02/07/2016 OT Individual Time: [id number]  1st session OT Individual Time Calculation (min): 75 min .  Short Term Goals: Week 1:  OT Short Term Goal 1 (Week 1): STGs=LTGS secondary to estimated LOS      Skilled Therapeutic Interventions/Progress Updates:    Skilled Therapeutic Interventions/Progress Updates:    1st session: .Precautions: sternal.: Pt. Recalled 3/3 sternal precautions. Performed bathing in shower.  Pt ambulated to toilet with RW and SBA.  Pt went from supine>sit>stand with SBA from bed.  Needed mod assist from stand toilet for sit to stand. Pt performed toileting with no assiistance .  Maintained on 2L O2 during shower due to sats dropping to 88%.  Pt bathed self on tub bench with SBA.  Assisted with sit to stand with mod assist to wash peri area. .   Pt.ambulated from shower to wc to complete dressing.   Pt performed grooming at sink with set up assist. Pt.left in room with all items in reach.     .Time:  1400-1500 (60min)  2nd session Pain:  4/10 Pain  Individual session  2nd session: Pt engaged in activity tolerance, standing balance, functional transfers, Propelled wc to gym with max assist. Utilized  SCI FIT for 2 min intervals with 1 min rest at zero resistance.  Pt's O2 was 98 % to 100 % thorough out activity.    Pt. Taken back to room. Pt transferred  to bed with min assist transfers.  Pt. Left with call bell, phone and all needs in reach.       Therapy Documentation Precautions:  Precautions Precautions: Fall, Sternal Precaution Comments: pt able to recall 2/3 sternal precautions and 3/3 with min question cues Restrictions Weight Bearing Restrictions: Yes Other Position/Activity Restrictions: sternal precautions    Vital Signs: Therapy Vitals Temp: 97.7 F (36.5 C) Temp Source: Oral Pulse Rate: 82 Resp: 18 BP:  125/69 mmHg Patient Position (if appropriate): Lying Oxygen Therapy SpO2: 100 % O2 Device: Nasal Cannula O2 Flow Rate (L/min): 2 L/min Pain:  none Pain Assessment Pain Assessment: 0-10 Pain Score: 0-No pain  4/10 chest     See Function Navigator for Current Functional Status.   Therapy/Group: Individual Therapy  Craig [name], Craig [name] 02/07/2016, 5:01 PM

## 2016-02-07 NOTE — Progress Notes (Signed)
ANTICOAGULATION CONSULT NOTE - Follow up  Pharmacy Consult:  Coumadin Indication: atrial fibrillation  No Known Allergies  Patient Measurements: Height: 5\' 9"  (175.3 cm) Weight: 163 lb 2.3 oz (74 kg) IBW/kg (Calculated) : 70.7  Vital Signs: Temp: 97.6 F (36.4 C) (04/02 0509) Temp Source: Oral (04/02 0509) BP: 117/70 mmHg (04/02 0509) Pulse Rate: 70 (04/02 0509)  Labs:  Recent Labs  02/05/16 0520 02/06/16 0559 02/07/16 0648  HGB 10.5*  --   --   HCT 32.5*  --   --   PLT 175  --   --   LABPROT 27.9* 24.4* 24.5*  INR 2.66* 2.22* 2.24*  CREATININE 1.16  --   --     Estimated Creatinine Clearance: 64.3 mL/min (by C-G formula based on Cr of 1.16).   Medical History: Past Medical History  Diagnosis Date  . COPD (chronic obstructive pulmonary disease) (HCC)   . Hypertension   . Hyperlipidemia   . Alcohol abuse     Prior  . Tobacco abuse     Prior       Assessment: [age] YOM transferred to Rehab on 02/03/16 s/p admission for sepsis and NSTEMI post CABG and pericardial AVR. Pharmacy consulted to dose warfarin.  INR continues to be therapeutic at 2.24 today. Last Hgb 10.5, plt wnl. No bleeding noted. Previously on fluconazole from 3/17-3/23.    Goal of Therapy:  INR 2-2.5    Plan:  - Coumadin 2.5 mg PO daily - INR  - Monitor s/sx bleeding    Hillery AldoElizabeth Geo Slone, Pharm.D., BCPS PGY2 Cardiology Pharmacy Resident Pager: [phone] 02/07/2016, 9:29 AM

## 2016-02-07 NOTE — Progress Notes (Signed)
PHYSICAL MEDICINE & REHABILITATION     PROGRESS NOTE  Subjective/Complaints:  According to OT desaturated while in the shower 87% off O2. Spoke to respiratory therapy, he is sounding clear. Receiving nebulizer treatments, Pulmicort, Brovana  ROS: + DOE. Denies CP, SOB, N/V/D  Objective: Vital Signs: Blood pressure 117/70, pulse 70, temperature 97.6 F (36.4 C), temperature source Oral, resp. rate 18, height  (1.753 m), weight 74 kg (163 lb 2.3 oz), SpO2 98 %. No results found.  Recent Labs  02/05/16 0520  WBC 8.7  HGB 10.5*  HCT 32.5*  PLT 175    Recent Labs  02/05/16 0520  NA 133*  K 5.3*  CL 97*  GLUCOSE 102*  BUN 20  CREATININE 1.16  CALCIUM 8.4*   CBG (last 3)  No results for input(s): GLUCAP in the last 72 hours.  Wt Readings from Last 3 Encounters:  02/07/16 74 kg (163 lb 2.3 oz)  02/03/16 75.1 kg (165 lb 9.1 oz)  03/31/15 66.951 kg (147 lb 9.6 oz)    Physical Exam:  BP 117/70 mmHg  Pulse 70  Temp(Src) 97.6 F (36.4 C) (Oral)  Resp 18  Ht  (1.753 m)  Wt 74 kg (163 lb 2.3 oz)  BMI 24.08 kg/m2  SpO2 98% Constitutional: He appears well-developed and well-nourished. Nasal cannula in place. NAD HENT: Normocephalic and atraumatic.  Eyes: Conjunctivae and EOM are normal. Pupils are equal, round, and reactive to light. No scleral icterus.  Cardiovascular: An irregularly irregular rhythm present.   Respiratory: No respiratory distress. Unlabored breathing. He has rhonchi in the right lower field and the left lower field. He exhibits no tenderness.  GI: Soft. Bowel sounds are normal. He exhibits no distension. There is no tenderness.  Musculoskeletal: He exhibits edema.  2+ B/l LE.   Neurological: He is alert and oriented.  Speech clear.  Motor: Proximally 4/5, distally 5/5  Skin: Skin is warm and dry.  Midline chest incision clean, dry and intact. Torso with dry dressing covering tubing bilaterally.  Psychiatric: His speech is  normal. Cognition and memory are not impaired. Behavior and affect are normal.  Assessment/Plan: 1. Functional deficits secondary to sepsis and NSTEMI with subsequent CABG/ascending aneurysm repair which require 3+ hours per day of interdisciplinary therapy in a comprehensive inpatient rehab setting. Physiatrist is providing close team supervision and 24 hour management of active medical problems listed below. Physiatrist and rehab team continue to assess barriers to discharge/monitor patient progress toward functional and medical goals.  Function:  Bathing Bathing position   Position: Shower  Bathing parts Body parts bathed by patient: Right arm, Left arm, Chest, Abdomen, Front perineal area, Right upper leg, Left upper leg, Right lower leg, Left lower leg Body parts bathed by helper: Buttocks, Back  Bathing assist Assist Level: Touching or steadying assistance(Pt > 75%)      Upper Body Dressing/Undressing Upper body dressing   What is the patient wearing?: Pull over shirt/dress     Pull over shirt/dress - Perfomed by patient: Thread/unthread right sleeve, Thread/unthread left sleeve, Put head through opening, Pull shirt over trunk Pull over shirt/dress - Perfomed by helper: Pull shirt over trunk        Upper body assist Assist Level: Touching or steadying assistance(Pt > 75%)   Set up : To obtain clothing/put away  Lower Body Dressing/Undressing Lower body dressing   What is the patient wearing?: Pants, Non-skid slipper socks     Pants- Performed by patient: Thread/unthread right  pants leg, Thread/unthread left pants leg, Pull pants up/down   Non-skid slipper socks- Performed by patient: Don/doff right sock, Don/doff left sock                    Lower body assist Assist for lower body dressing: Supervision or verbal cues   Set up : To obtain clothing/put away  Toileting Toileting   Toileting steps completed by patient: Adjust clothing prior to toileting, Performs  perineal hygiene, Adjust clothing after toileting   Toileting Assistive Devices: Grab bar or rail  Toileting assist Assist level: Touching or steadying assistance (Pt.75%)   Transfers Chair/bed transfer   Chair/bed transfer method: Stand pivot Chair/bed transfer assist level: Touching or steadying assistance (Pt > 75%) Chair/bed transfer assistive device: Patent attorneyWalker     Locomotion Ambulation     Max distance: 220 Assist level: Supervision or verbal cues   Wheelchair   Type: Manual Max wheelchair distance: 160 Assist Level: Dependent (Pt equals 0%) (sternal precautions)  Cognition Comprehension Comprehension assist level: Follows complex conversation/direction with extra time/assistive device  Expression Expression assist level: Expresses complex ideas: With extra time/assistive device  Social Interaction Social Interaction assist level: Interacts appropriately with others with medication or extra time (anti-anxiety, antidepressant).  Problem Solving Problem solving assist level: Solves complex 90% of the time/cues < 10% of the time  Memory Memory assist level: Assistive device: No helper    Medical Problem List and Plan: 1. Debility secondary to sepsis and NSTEMI with subsequent CABG/ascending aneurysm repair.   Continue CIR 2. DVT Prophylaxis/Anticoagulation: Pharmaceutical: Coumadin  Pharmacy protocol 3. Pain Management: Continue oxycodone prn. Avoid Tylenol products due to shock liver 4. Mood: Team to provide ego support. LCSW to follow for evaluation and support.  5. Neuropsych: This patient is capable of making decisions on his own behalf. 6. Skin/Wound Care: Monitor nutrition and hydration status.  7. Fluids/Electrolytes/Nutrition: Monitor I/O.   Hyponatremia: 133 on 3/31 (stable)  Hypokalemia: 5.3 on 3/31. Will continue to monitor 8. CAD s/p CABG x 4: To continue sternal precautions. On ASA, BB--no statin due to shocked liver.  9. A fib: Monitor heart rate bid  and with activity. Continue coumadin, metoprolol and digoxin.  10. COPD: On spiriva, Pulmicort nebs and Mucinex bid. Oxygen dependent at this time.  11. Leucocytosis: Resolved   Monitor for signs of infection.   Completed antibiotics 03/28.  12. Thrombocytopenia: Continue to monitor platelets. Has been off heparin since surgery.   13. ABLA: Stable.   Hemoglobin 10.5 on 3/31  Will continue to monitor  14. Hypoalbuminemia  Supplementation started on 3/30   LOS (Days) 4 A FACE TO FACE EVALUATION WAS PERFORMED  Erick ColaceKIRSTEINS,ANDREW E 02/07/2016 9:35 AM

## 2016-02-07 NOTE — Progress Notes (Signed)
Physical Therapy Session Note  Patient Details  Name: Craig [name] MRN: [name] Date of Birth: 03/12/1951  Today's Date: 02/07/2016 PT Individual Time: 1300-1415 PT Individual Time Calculation (min): 75 min   Short Term Goals: Week 1:  PT Short Term Goal 1 (Week 1): =LTGs due to ELOS  Skilled Therapeutic Interventions/Progress Updates:  Pt was seen bedside in the pm. Pt connected to portable O2 at 2 liters. Pt transferred supine to edge of bed with head of bed elevated and S with increased time. Pt transferred edge of bed to w/c with 4WW and min A with verbal cues. Pt transported to rehab gym. Pt transferred sit to stand with hands holding onto cardiac pillow with min to mod A and verbal cues. Pt transferred sit to stand from w/c with 4WW and min A. Pt would reach out towards walker but did not push up through it to stand and get weight shifted forward. Pt did better with sit to stand transfers from higher surface. Pt ambulated distances of 140, 200 and 40 feet with 4ww and min guard with verbal cues. Pt rode recumbent bike x 10 minutes at level 1 without rest break. Pt returned to room and left sitting up in w/c at end of treatment with call bell within reach. Pt reconnected to wall O2 at 2 liters.   Therapy Documentation Precautions:  Precautions Precautions: Fall, Sternal Precaution Comments: pt able to recall 2/3 sternal precautions and 3/3 with min question cues Restrictions Weight Bearing Restrictions: Yes Other Position/Activity Restrictions: sternal precautions General:   Vital Signs: Therapy Vitals Oxygen Therapy SpO2: 100 % O2 Device: Nasal Cannula O2 Flow Rate (L/min): 2 L/min Pain: Pt c/o mild chest pain.   See Function Navigator for Current Functional Status.   Therapy/Group: Individual Therapy  Rayford HalstedMitchell, Lanea Vankirk G 02/07/2016, 3:26 PM

## 2016-02-08 ENCOUNTER — Inpatient Hospital Stay (HOSPITAL_COMMUNITY): Payer: Medicaid Other | Admitting: Physical Therapy

## 2016-02-08 ENCOUNTER — Ambulatory Visit (HOSPITAL_COMMUNITY): Payer: Self-pay

## 2016-02-08 ENCOUNTER — Inpatient Hospital Stay (HOSPITAL_COMMUNITY): Payer: Medicaid Other | Admitting: Occupational Therapy

## 2016-02-08 LAB — PROTIME-INR
INR: 2.26 — ABNORMAL HIGH (ref 0.00–1.49)
Prothrombin Time: 24.7 seconds — ABNORMAL HIGH (ref 11.6–15.2)

## 2016-02-08 MED ORDER — WARFARIN SODIUM 2.5 MG PO TABS
2.5000 mg | ORAL_TABLET | Freq: Once | ORAL | Status: AC
  Administered 2016-02-08: 2.5 mg via ORAL
  Filled 2016-02-08: qty 1

## 2016-02-08 NOTE — Progress Notes (Signed)
Physical Therapy Session Note  Patient Details  Name: Craig [name] MRN: [id number] Date of Birth: 1951-09-05  Today's Date: 02/08/2016 PT Individual Time: 0223-3612 PT Individual Time Calculation (min): 83 min   Short Term Goals: Week 1:  PT Short Term Goal 1 (Week 1): =LTGs due to ELOS  Skilled Therapeutic Interventions/Progress Updates:    Pt received resting in recliner, no c/o pain, and agreeable to therapy session.  Pt requesting pre-medication for Pleurx draining midway through session and RN in to deliver.  Gait training x300' from room to [place] with RW and distant supervision.  Pt taken to solarium and PT discussed discharge planning with pt including possibility of d/c later this week (pending conference on Wednesday), home set up, DME, supervision, HEP, and energy conservation.  Pt actively engaged in conversation and answered questions regarding home scenarios appropriate with min question cues.  Gait training x150' with rollator and distant supervision back to room.  PT instructed pt in BLE therex with 2# ankle weights for LAQ with 3 sec hold, heel raises from seated position with weight shifted forward onto feet, heel slides, SLR, hip abd/add to midline, and SAQ.  PT discussed available DME and d/c planning with pt's sister (who was present upon return to room and pt will be staying with at d/c) and she verbalizes understanding.  Pt left seated in recliner with call bell in reach and needs met.   Therapy Documentation Precautions:  Precautions Precautions: Fall, Sternal Precaution Comments: pt able to recall 2/3 sternal precautions and 3/3 with min question cues Restrictions Weight Bearing Restrictions: No Other Position/Activity Restrictions: sternal precautions   See Function Navigator for Current Functional Status.   Therapy/Group: Individual Therapy  Earnest Conroy Penven-Crew 02/08/2016, 4:24 PM

## 2016-02-08 NOTE — Progress Notes (Signed)
Occupational Therapy Session Note  Patient Details  Name: Craig [name] MRN: [id number] Date of Birth: 08/04/1951  Today's Date: 02/08/2016 OT Individual Time: 1128-1203 OT Individual Time Calculation (min): 35 min    Short Term Goals: Week 1:  OT Short Term Goal 1 (Week 1): STGs=LTGS secondary to estimated LOS  Skilled Therapeutic Interventions/Progress Updates:    Pt completed transfer supine to sit and ambulated to the bathroom for use of the toilet with the rollator with supervision.  Min instructional cueing to not place both hands on the rollator for sit to stand with pt stating he's "not pulling on it."  Max assist for toilet hygiene as he states he can complete some hygiene but not quite as thorough as he would like.  Man need AE trial for this.  Ambulated to the OT gym after washing his hands, without rest break, and with use of the rollator.  Oxygen sats 95% on 2Ls at rest.  Completed 5 mins of work on the [employer], with just use of the LEs secondary to sternal precautions.  Average steps per minute 32-34 with resistance on level 4.  After brief rest he ambulated back to the room and to the EOB.  Pt does not lock rollator brakes or turn it completely around and square his body up to the surface when sitting.  He transferred to supine with supervision with call button and phone in reach.      Therapy Documentation Precautions:  Precautions Precautions: Fall, Sternal Precaution Comments: pt able to recall 2/3 sternal precautions and 3/3 with min question cues Restrictions Weight Bearing Restrictions: No Other Position/Activity Restrictions: sternal precautions  Vital Signs: Therapy Vitals Pulse Rate: 84 BP: 126/70 mmHg Patient Position (if appropriate): Sitting Oxygen Therapy SpO2: 96 % O2 Device: Nasal Cannula O2 Flow Rate (L/min): 2 L/min Pulse Oximetry Type: Intermittent Pain: Pain Assessment Pain Assessment: No/denies pain ADL: See Function Navigator for Current  Functional Status.   Therapy/Group: Individual Therapy  Marlyne Totaro OTR/L 02/08/2016, 12:52 PM

## 2016-02-08 NOTE — Progress Notes (Signed)
Physical Therapy Session Note  Patient Details  Name: Craig [name] MRN: [id number] Date of Birth: 13-Oct-1951  Today's Date: 02/08/2016 PT Individual Time: 1336-1400 PT Individual Time Calculation (min): 24 min   Short Term Goals: Week 1:  PT Short Term Goal 1 (Week 1): =LTGs due to ELOS  Skilled Therapeutic Interventions/Progress Updates:    Pt received resting in recliner with no c/o pain at rest and agreeable to therapy session.  PT instructed pt in incentive spirometry x10 reps with rest breaks in between each breath, pt able to achieve 1700-1900 mL on incentive spirometry.  Sit<>stand from recliner with supervision with pt demonstrating decreased power up.  PT instructed pt in BLE therex x15 reps for minisquats, heel raises, and hamstring curls in standing.  Standing rest breaks between each exercise to catch breath.  Pt returned to sitting and performed 10 reps sitting isometric glute sets with 3 second hold.  Pt left sitting in recliner with LEs elevated, call bell in reach, and needs met.   Therapy Documentation Precautions:  Precautions Precautions: Fall, Sternal Precaution Comments: pt able to recall 2/3 sternal precautions and 3/3 with min question cues Restrictions Weight Bearing Restrictions: No Other Position/Activity Restrictions: sternal precautions   See Function Navigator for Current Functional Status.   Therapy/Group: Individual Therapy  Earnest Conroy Penven-Crew 02/08/2016, 2:26 PM

## 2016-02-08 NOTE — Progress Notes (Signed)
Occupational Therapy Session Note  Patient Details  Name: Craig [name] MRN: [id number] Date of Birth: 04/01/1951  Today's Date: 02/08/2016 OT Missed Time: 60 Minutes Missed Time Reason: Patient unwilling/refused to participate without medical reason;Patient fatigue   Short Term Goals: Week 1:  OT Short Term Goal 1 (Week 1): STGs=LTGS secondary to estimated LOS     Skilled Therapeutic Interventions/Progress Updates:    10:15 am  Pt sleeping in bed. Pt gently woken up, but pt upset stating that I was the 4th person to wake him this morning. He stated he was too fatigued and not able to participate as he had to sleep. Pt allowed to rest.   Therapy Documentation Precautions:  Precautions Precautions: Fall, Sternal Precaution Comments: pt able to recall 2/3 sternal precautions and 3/3 with min question cues Restrictions Weight Bearing Restrictions: Yes Other Position/Activity Restrictions: sternal precautions    Vital Signs: Therapy Vitals Temp: 97.7 F (36.5 C) Temp Source: Oral Pulse Rate: 79 Resp: 18 BP: 128/69 mmHg Patient Position (if appropriate): Lying Oxygen Therapy SpO2: 99 % O2 Device: Nasal Cannula O2 Flow Rate (L/min): 2 L/min    ADL:   See Function Navigator for Current Functional Status.   Therapy/Group: Individual Therapy  Jewett Mcgann 02/08/2016, 8:31 AM

## 2016-02-08 NOTE — Progress Notes (Signed)
[place] PHYSICAL MEDICINE & REHABILITATION     PROGRESS NOTE  Subjective/Complaints:  Patient sitting up in bed this morning eating breakfast. He had a good weekend.  ROS: + DOE. Denies CP, SOB, N/V/D  Objective: Vital Signs: Blood pressure 126/70, pulse 76, temperature 97.7 F (36.5 C), temperature source Oral, resp. rate 18, height 5\' 9"  (1.753 m), weight 63.73 kg (140 lb 8 oz), SpO2 99 %. No results found. No results for input(s): WBC, HGB, HCT, PLT in the last 72 hours. No results for input(s): NA, K, CL, GLUCOSE, BUN, CREATININE, CALCIUM in the last 72 hours.  Invalid input(s): CO CBG (last 3)  No results for input(s): GLUCAP in the last 72 hours.  Wt Readings from Last 3 Encounters:  02/08/16 63.73 kg (140 lb 8 oz)  02/03/16 75.1 kg (165 lb 9.1 oz)  03/31/15 66.951 kg (147 lb 9.6 oz)    Physical Exam:  BP 126/70 mmHg  Pulse 76  Temp(Src) 97.7 F (36.5 C) (Oral)  Resp 18  Ht 5\' 9"  (1.753 m)  Wt 63.73 kg (140 lb 8 oz)  BMI 20.74 kg/m2  SpO2 99% Constitutional: He appears well-developed and well-nourished. Nasal cannula in place. NAD HENT: Normocephalic and atraumatic.  Eyes: Conjunctivae and EOM are normal. Pupils are equal, round, and reactive to light. No scleral icterus.  Cardiovascular: An irregularly irregular rhythm present.   Respiratory: No respiratory distress. Unlabored breathing. Clear to auscultation. He exhibits no tenderness.  GI: Soft. Bowel sounds are normal. He exhibits no distension. There is no tenderness.  Musculoskeletal: He exhibits edema, 2+ B/l LE.   Neurological: He is alert and oriented.  Speech clear.  Motor:  Right upper extremity 5/5 proximal distal Left upper extremity 4+/5 proximally, 5/5 distally Right lower extremity 5/5 proximal distal Left lower extremity: 4+/5 hip flexion, knee extension, 5/5 ankle dorsi/plantarflexion Skin: Skin is warm and dry.  Midline chest incision clean, dry and intact. Torso with dry dressing  covering tubing bilaterally.  Psychiatric: His speech is normal. Cognition and memory are not impaired. Behavior and affect are normal.  Assessment/Plan: 1. Functional deficits secondary to sepsis and NSTEMI with subsequent CABG/ascending aneurysm repair which require 3+ hours per day of interdisciplinary therapy in a comprehensive inpatient rehab setting. Physiatrist is providing close team supervision and 24 hour management of active medical problems listed below. Physiatrist and rehab team continue to assess barriers to discharge/monitor patient progress toward functional and medical goals.  Function:  Bathing Bathing position   Position: Shower  Bathing parts Body parts bathed by patient: Right arm, Left arm, Chest, Abdomen, Front perineal area, Right upper leg, Left upper leg, Right lower leg, Left lower leg Body parts bathed by helper: Buttocks, Back  Bathing assist Assist Level: Touching or steadying assistance(Pt > 75%)      Upper Body Dressing/Undressing Upper body dressing   What is the patient wearing?: Pull over shirt/dress     Pull over shirt/dress - Perfomed by patient: Thread/unthread right sleeve, Thread/unthread left sleeve, Put head through opening, Pull shirt over trunk Pull over shirt/dress - Perfomed by helper: Pull shirt over trunk        Upper body assist Assist Level: Touching or steadying assistance(Pt > 75%)   Set up : To obtain clothing/put away  Lower Body Dressing/Undressing Lower body dressing   What is the patient wearing?: Pants, Non-skid slipper socks     Pants- Performed by patient: Thread/unthread right pants leg, Thread/unthread left pants leg, Pull pants up/down  Non-skid slipper socks- Performed by patient: Don/doff right sock, Don/doff left sock                    Lower body assist Assist for lower body dressing: Supervision or verbal cues   Set up : To obtain clothing/put away  Toileting Toileting   Toileting steps  completed by patient: Adjust clothing prior to toileting, Performs perineal hygiene, Adjust clothing after toileting   Toileting Assistive Devices: Grab bar or rail  Toileting assist Assist level: Touching or steadying assistance (Pt.75%)   Transfers Chair/bed transfer   Chair/bed transfer method: Stand pivot Chair/bed transfer assist level: Touching or steadying assistance (Pt > 75%) Chair/bed transfer assistive device: Patent attorney     Max distance: 200 Assist level: Supervision or verbal cues   Wheelchair   Type: Manual Max wheelchair distance: 160 Assist Level: Dependent (Pt equals 0%) (sternal precautions)  Cognition Comprehension Comprehension assist level: Follows complex conversation/direction with extra time/assistive device  Expression Expression assist level: Expresses complex ideas: With extra time/assistive device  Social Interaction Social Interaction assist level: Interacts appropriately with others with medication or extra time (anti-anxiety, antidepressant).  Problem Solving Problem solving assist level: Solves complex 90% of the time/cues < 10% of the time  Memory Memory assist level: Assistive device: No helper    Medical Problem List and Plan: 1. Debility secondary to sepsis and NSTEMI with subsequent CABG/ascending aneurysm repair.   Continue CIR 2. DVT Prophylaxis/Anticoagulation: Pharmaceutical: Coumadin  INR therapeutic on 4/30 3.  Pain management: Continue oxycodone prn. Avoid Tylenol products due to shock liver 4. Mood: Team to provide ego support. LCSW to follow for evaluation and support.  5. Neuropsych: This patient is capable of making decisions on his own behalf. 6. Skin/Wound Care: Monitor nutrition and hydration status.  7. Fluids/Electrolytes/Nutrition: Monitor I/O.   Hyponatremia: 133 on 3/31 (stable)  Hypokalemia: 5.3 on 3/31. Will continue to monitor  Will order labs for tomorrow  8. CAD s/p CABG x 4: To  continue sternal precautions. On ASA, BB--no statin due to shocked liver.  9. A fib: Monitor heart rate bid and with activity. Continue coumadin, metoprolol and digoxin.  10. COPD: On spiriva, Pulmicort nebs and Mucinex bid. Oxygen dependent at this time.  11. Leucocytosis: Resolved   Monitor for signs of infection.   Completed antibiotics 03/28.   Will order labs for tomorrow  12. Thrombocytopenia: Continue to monitor platelets. Has been off heparin since surgery.    Will order labs for tomorrow  13. ABLA: Stable.   Hemoglobin 10.5 on 3/31  Will continue to monitor   Will order labs for tomorrow  14. Hypoalbuminemia  Supplementation started on 3/30  Will order labs for tomorrow    LOS (Days) 5 A FACE TO FACE EVALUATION WAS PERFORMED  Craig [name] Karis Juba 02/08/2016 9:14 AM

## 2016-02-08 NOTE — Plan of Care (Signed)
Problem: RH PAIN MANAGEMENT Goal: RH STG PAIN MANAGED AT OR BELOW PT'S PAIN GOAL <3  Outcome: Progressing Pt only requires premedication for Pleural drain and dressing change

## 2016-02-08 NOTE — Progress Notes (Signed)
ANTICOAGULATION CONSULT NOTE - Follow Up Consult  Pharmacy Consult for coumadin Indication: AVR s/p CABG  No Known Allergies  Patient Measurements: Height: 5\' 9"  (175.3 cm) Weight: 140 lb 8 oz (63.73 kg) IBW/kg (Calculated) : 70.7 Heparin Dosing Weight:   Vital Signs: Temp: 97.7 F (36.5 C) (04/03 0500) Temp Source: Oral (04/03 0500) BP: 128/69 mmHg (04/03 0500) Pulse Rate: 66 (04/03 0500)  Labs:  Recent Labs  02/06/16 0559 02/07/16 0648 02/08/16 0550  LABPROT 24.4* 24.5* 24.7*  INR 2.22* 2.24* 2.26*    Estimated Creatinine Clearance: 58 mL/min (by C-G formula based on Cr of 1.16).   Medications:  Scheduled:  . arformoterol  15 mcg Nebulization BID  . aspirin EC  81 mg Oral Daily  . budesonide (PULMICORT) nebulizer solution  0.5 mg Nebulization BID  . digoxin  0.25 mg Oral Daily  . famotidine  20 mg Oral Daily  . guaiFENesin  1,200 mg Oral BID  . metoprolol tartrate  12.5 mg Oral BID  . protein supplement  1 scoop Oral TID WC  . tiotropium  18 mcg Inhalation Daily  . Warfarin - Pharmacist Dosing Inpatient   Does not apply q1800   Infusions:    Assessment: 65 yo male with AVR is currently on therapeutic coumadin. INR today is 2.26.  Goal of Therapy:  INR 2-2.5 per MD's request Monitor platelets by anticoagulation protocol: Yes   Plan:  - coumadin 2.5 mg po x1 - INR in am  Craig [name], Craig [name] 02/08/2016,8:06 AM

## 2016-02-09 ENCOUNTER — Inpatient Hospital Stay (HOSPITAL_COMMUNITY): Payer: Medicaid Other | Admitting: *Deleted

## 2016-02-09 ENCOUNTER — Inpatient Hospital Stay (HOSPITAL_COMMUNITY): Payer: Medicaid Other | Admitting: Occupational Therapy

## 2016-02-09 ENCOUNTER — Inpatient Hospital Stay (HOSPITAL_COMMUNITY): Payer: Self-pay | Admitting: Physical Therapy

## 2016-02-09 ENCOUNTER — Inpatient Hospital Stay (HOSPITAL_COMMUNITY): Payer: Medicaid Other

## 2016-02-09 DIAGNOSIS — R600 Localized edema: Secondary | ICD-10-CM | POA: Diagnosis present

## 2016-02-09 DIAGNOSIS — R609 Edema, unspecified: Secondary | ICD-10-CM

## 2016-02-09 LAB — BASIC METABOLIC PANEL
Anion gap: 9 (ref 5–15)
BUN: 20 mg/dL (ref 6–20)
CALCIUM: 8 mg/dL — AB (ref 8.9–10.3)
CO2: 25 mmol/L (ref 22–32)
CREATININE: 1.16 mg/dL (ref 0.61–1.24)
Chloride: 98 mmol/L — ABNORMAL LOW (ref 101–111)
GFR calc Af Amer: >60 mL/min (ref >60)
GLUCOSE: 98 mg/dL (ref 65–99)
Potassium: 4.8 mmol/L (ref 3.5–5.1)
Sodium: 132 mmol/L — ABNORMAL LOW (ref 135–145)

## 2016-02-09 LAB — CBC WITH DIFFERENTIAL/PLATELET
BAND NEUTROPHILS: 0 %
BLASTS: 0 %
Basophils Absolute: 0.2 10*3/uL — ABNORMAL HIGH (ref 0.0–0.1)
Basophils Relative: 2 %
EOS ABS: 0.3 10*3/uL (ref 0.0–0.7)
Eosinophils Relative: 3 %
HCT: 30.9 % — ABNORMAL LOW (ref 39.0–52.0)
Hemoglobin: 9.9 g/dL — ABNORMAL LOW (ref 13.0–17.0)
LYMPHS PCT: 12 %
Lymphs Abs: 1 10*3/uL (ref 0.7–4.0)
MCH: 31.1 pg (ref 26.0–34.0)
MCHC: 32 g/dL (ref 30.0–36.0)
MCV: 97.2 fL (ref 78.0–100.0)
MONOS PCT: 7 %
Metamyelocytes Relative: 0 %
Monocytes Absolute: 0.6 10*3/uL (ref 0.1–1.0)
Myelocytes: 0 %
NEUTROS ABS: 6.4 10*3/uL (ref 1.7–7.7)
NEUTROS PCT: 76 %
OTHER: 0 %
PLATELETS: 200 10*3/uL (ref 150–400)
Promyelocytes Absolute: 0 %
RBC: 3.18 MIL/uL — ABNORMAL LOW (ref 4.22–5.81)
RDW: 17.6 % — AB (ref 11.5–15.5)
WBC: 8.5 10*3/uL (ref 4.0–10.5)
nRBC: 0 /100 WBC

## 2016-02-09 LAB — PROTIME-INR
INR: 2.3 — AB (ref 0.00–1.49)
PROTHROMBIN TIME: 25 s — AB (ref 11.6–15.2)

## 2016-02-09 LAB — ALBUMIN: Albumin: 1.9 g/dL — ABNORMAL LOW (ref 3.5–5.0)

## 2016-02-09 MED ORDER — WARFARIN SODIUM 2.5 MG PO TABS
2.5000 mg | ORAL_TABLET | Freq: Once | ORAL | Status: AC
  Administered 2016-02-09: 2.5 mg via ORAL
  Filled 2016-02-09: qty 1

## 2016-02-09 MED ORDER — FUROSEMIDE 40 MG PO TABS
40.0000 mg | ORAL_TABLET | Freq: Every day | ORAL | Status: DC
  Administered 2016-02-09 – 2016-02-10 (×2): 40 mg via ORAL
  Filled 2016-02-09 (×2): qty 1

## 2016-02-09 NOTE — Progress Notes (Signed)
Physical Therapy Session Note  Patient Details  Name: Craig [name] MRN: [id number] Date of Birth: [birth year]  Today's Date: 02/09/2016 PT Individual Time: 1545-1640 PT Individual Time Calculation (min): 55 min   Short Term Goals: Week 1:  PT Short Term Goal 1 (Week 1): =LTGs due to ELOS  Skilled Therapeutic Interventions/Progress Updates:    Pt received resting in bed, no c/o pain, and agreeable to therapy session.  Session focus on gait training, endurance, and HEP.  Pt amb to/from therapy gym with rollator and distant supervision.  Pt is at consistent supervision level for transfers and demos improving ability to transition sit<>stand with LE power rather than use of UEs on rollator.  PT instructed pt in TUG with rollator with average of 3 trials 18.43 seconds.  Cut off score for community dwelling adults with a variety of medical conditions is >20 indicated increased risk of falling.  PT provided interpretation of results to pt and pt verbalized understanding.  PT instructed pt in OTAGO level B exercises and also provided pt with handout for level C exercises.  PT discussed progression from level B to level C with pt at home to be based on level of difficulty completing full set of exercises without a rest break.  Pt returned to room at end of session and positioned in recliner with call bell in reach and needs met.    Therapy Documentation Precautions:  Precautions Precautions: Fall, Sternal Precaution Comments: pt able to recall 2/3 sternal precautions and 3/3 with min question cues Restrictions Weight Bearing Restrictions: No Other Position/Activity Restrictions: sternal precautions   See Function Navigator for Current Functional Status.   Therapy/Group: Individual Therapy  Earnest Conroy Penven-Crew 02/09/2016, 4:43 PM

## 2016-02-09 NOTE — Progress Notes (Signed)
ANTICOAGULATION CONSULT NOTE - Follow Up Consult  Pharmacy Consult for coumadin Indication: prosthetic AV  No Known Allergies  Patient Measurements: Height: 5\' 9"  (175.3 cm) Weight: 138 lb 10.7 oz (62.9 kg) IBW/kg (Calculated) : 70.7 Heparin Dosing Weight:   Vital Signs: Temp: 97.5 F (36.4 C) (04/04 0605) Temp Source: Oral (04/04 0605) BP: 117/62 mmHg (04/04 0605) Pulse Rate: 69 (04/04 0756)  Labs:  Recent Labs  02/07/16 0648 02/08/16 0550 02/09/16 0643  HGB  --   --  9.9*  HCT  --   --  30.9*  PLT  --   --  200  LABPROT 24.5* 24.7* 25.0*  INR 2.24* 2.26* 2.30*  CREATININE  --   --  1.16    Estimated Creatinine Clearance: 57.2 mL/min (by C-G formula based on Cr of 1.16).   Medications:  Scheduled:  . arformoterol  15 mcg Nebulization BID  . aspirin EC  81 mg Oral Daily  . budesonide (PULMICORT) nebulizer solution  0.5 mg Nebulization BID  . digoxin  0.25 mg Oral Daily  . famotidine  20 mg Oral Daily  . guaiFENesin  1,200 mg Oral BID  . metoprolol tartrate  12.5 mg Oral BID  . protein supplement  1 scoop Oral TID WC  . tiotropium  18 mcg Inhalation Daily  . Warfarin - Pharmacist Dosing Inpatient   Does not apply q1800   Infusions:    Assessment: 65 yo male with prosthetic AV is currently on therapeutic coumadin.  INR today is 2.3.  Goal of Therapy:  INR 2-2.5 Monitor platelets by anticoagulation protocol: Yes   Plan:  - Coumadin 2.5 mg PO today - Daily PT / INR   Louis Ivery, Tsz-Yin 02/09/2016,8:27 AM

## 2016-02-09 NOTE — Progress Notes (Signed)
Occupational Therapy Note  Patient Details  Name: Craig [name] MRN: [id number] Date of Birth: 02/06/1951  Today's Date: 02/09/2016 OT Missed Time: 75 Minutes Missed Time Reason: Patient fatigue  Pt missed 75 mins skilled OT services.  Pt asleep upon arrival and required mod multimodal cues to arouse.  Pt stated he was "up most of the night coughing" and needed to sleep.    Lavone NeriLanier, [place] [name] [facility] 02/09/2016, 10:12 AM

## 2016-02-09 NOTE — Progress Notes (Signed)
Physical Therapy Session Note  Patient Details  Name: [name] MRN: [id number] Date of Birth: [birth year]  Today's Date: 02/09/2016 PT Individual Time: 1105-1203 PT Individual Time Calculation (min): 58 min   Short Term Goals: Week 1:  PT Short Term Goal 1 (Week 1): =LTGs due to ELOS  Skilled Therapeutic Interventions/Progress Updates:    Pt received sleeping in bed, but easily awakes to voice, no c/o pain and agreeable to therapy session.  Pt transfers throughout session with supervision.  Gait training x150' with rollator and distant supervision.  Repeated sit<>stand x6 with increased time, use of UEs to steady on knees for balance.  Pt engaged in dynamic sitting balance activity ball taps with hockey stick with no LE or UE support.  Pt engaged in dynamic standing activity with no UE support ball taps.  Pt engaged in kicking ball with alternating LEs and light R hand held assist for weight shifting.  Gait training back to room with rollator and supervision.  PT provided education throughout session on energy conservation. Pt left sitting in recliner with call bell in reach and needs met.   Therapy Documentation Precautions:  Precautions Precautions: Fall, Sternal Precaution Comments: pt able to recall 2/3 sternal precautions and 3/3 with min question cues Restrictions Weight Bearing Restrictions: No Other Position/Activity Restrictions: sternal precautions   See Function Navigator for Current Functional Status.   Therapy/Group: Individual Therapy  Earnest Conroy Penven-Crew 02/09/2016, 12:28 PM

## 2016-02-09 NOTE — Progress Notes (Signed)
Recreational Therapy Assessment and Plan  Patient Details  Name: Craig [name] MRN: [id number] Date of Birth: 02-28-1951 Today's Date: 02/09/2016 Assessment Clinical Impression:  Problem List:  Patient Active Problem List   Diagnosis Date Noted  . Hyperkalemia   . Hypoalbuminemia due to protein-calorie malnutrition ([place])   . Debility 02/03/2016  . History of sepsis   . History of non-ST elevation myocardial infarction (NSTEMI)   . Coronary artery disease involving coronary bypass graft of native heart without angina pectoris   . History of aortic aneurysm repair   . Chronic obstructive pulmonary disease ([place])   . Leukocytosis   . Hyponatremia   . Thrombocytopenia ([place])   . Acute blood loss anemia   . Sepsis ([place])   . S/P CABG (coronary artery bypass graft)   . Paroxysmal atrial fibrillation ([facility])   . S/P CABG x 4   . Pleural effusion   . Acute respiratory acidosis   . Acute renal failure (ARF) ([place])   . Severe aortic stenosis   . Coronary artery disease involving native coronary artery of native heart without angina pectoris   . Coronary artery disease involving native coronary artery of native heart with unstable angina pectoris ([place])   . Acute respiratory failure ([place])   . CAD (coronary artery disease)   . Aortic stenosis   . Acute respiratory failure with hypoxia ([place]) 12/27/2015  . Elevated troponin   . History of hypertension   . History of hyperlipidemia   . Ascending aortic aneurysm ([place])   . CAP (community acquired pneumonia) 12/26/2015  . NSTEMI (non-ST elevated myocardial infarction) ([place]) 12/26/2015  . COPD (chronic obstructive pulmonary disease) ([place]) 12/26/2015  . Hypoxia 12/26/2015  . Ruptured lumbar disc 02/17/2015  . Back pain 02/17/2015    Past Medical History:  Past Medical History  Diagnosis Date  . COPD (chronic obstructive  pulmonary disease) ([place])   . Hypertension   . Hyperlipidemia   . Alcohol abuse     Prior  . Tobacco abuse     Prior   Past Surgical History:  Past Surgical History  Procedure Laterality Date  . Ruptured disc repair  2009 or 2010  . Cardiac catheterization N/A 12/29/2015    Procedure: Right/Left Heart Cath and Coronary Angiography; Surgeon: Peter M Martinique, MD; Location: [facility]; Service: Cardiovascular; Laterality: N/A;  . Coronary artery bypass graft N/A 01/07/2016    Procedure: CORONARY ARTERY BYPASS GRAFTING (CABG), ON PUMP, TIMES FOUR, USING LEFT INTERNAL MAMMARY ARTERY, BILATERAL GREATER SAPHENOUS VEINS HARVESTED ENDOSCOPICALLY; Surgeon: Gaye Pollack, MD; Location: [facility]; Service: Open Heart Surgery; Laterality: N/A; LIMA to LAD, SVG to OM, SVG SEQUENTIALLY to PDA and PLB  . Tee without cardioversion N/A 01/07/2016    Procedure: TRANSESOPHAGEAL ECHOCARDIOGRAM (TEE); Surgeon: Gaye Pollack, MD; Location: [facility]; Service: Open Heart Surgery; Laterality: N/A;  . Thoracic aortic aneurysm repair N/A 01/07/2016    Procedure: CIRC ARREST AND REPLACEMENT OF ASCENDING THORACIC ANEURYSM; Surgeon: Gaye Pollack, MD; Location: [facility]; Service: Open Heart Surgery; Laterality: N/A;  . Chest tube insertion Bilateral 01/25/2016    Procedure: INSERTION Bilateral PLEURAL DRAINAGE CATHETER; Surgeon: Gaye Pollack, MD; Location: [facility]; Service: Thoracic; Laterality: Bilateral;    Assessment & Plan Clinical Impression: Craig [name] is a 65 y.o. right handed male with history of COPD/tobacco and alcohol abuse. Presented 12/26/2015 with flulike symptoms including shortness of breath, cough and low-grade fever as well as chills 2 days. Nonspecific chest tightness. He was  evaluated in the ED found to be hypoxic placed on supplemental oxygen. Chest x-ray showed right upper lobe pneumonia. CT angiogram of the chest no  pulmonary emboli. Troponin elevated 2.32. Placed on broad-spectrum antibiotics. Cardiology services consulted. EKG demonstrated variable sinus tachycardia and ectopic atrial tachycardia, inferior Q waves, transient inferior ST-T wave abnormalities. Echocardiogram with ejection fraction of 40%. Akinesis of the mid-apical inferior lateral myocardium. Critical care pulmonary services was consulted 12/27/2015 for increasing shortness of breath patient ultimately intubated. Cardiac catheterization 12/29/2015 showed 50% ostial LM stenosis and three-vessel coronary artery disease with severe aortic stenosis. Cardiothoracic surgery Dr. Cyndia Bent consulted with patient stabilized and underwent CABG with aortic valve replacement 01/07/2016. Follow-up echocardiogram 01/11/2016 showed LV and RV systolic function normal. Patient currently maintained on Coumadin therapy. He was extubated and diet has been slowly advanced to regular. Patient transferred to CIR on 02/03/2016.  Met with pt to discuss leisure interests and discharge planning since pt is expected to discharge home by the end of the week.  Pt is motivated to get home and return to previously enjoyed activities.  Pt recognizes activity tolerance as the most limiting factor in further independence.  No further TR implemented.      Leisure History/Participation Premorbid leisure interest/current participation: [place];Petra Kuba - Fishing;Nature - [place] care;Community - Doctor, hospital - Grocery store Premorbid leisure interest/no interest in trying: Sports - Other (Comment) (horse back riding) Other Leisure Interests: Television;Cooking/Baking;Housework Leisure Participation Style: Alone;With Family/Friends Awareness of Community Resources: Excellent Psychosocial / Spiritual Social interaction - Mood/Behavior: Cooperative Academic librarian Appropriate for Education?: Yes Recreational Therapy Orientation Orientation -Reviewed with patient:  Available activity resources Strengths/Weaknesses Patient Strengths/Abilities: Active premorbidly Patient weaknesses: Physical limitations TR Patient demonstrates impairments in the following area(s): Endurance;Safety;Skin Integrity  The above assessment, treatment plan, treatment alternatives and goals were discussed and mutually agreed upon: by patient  [place] 02/09/2016, 12:14 PM

## 2016-02-09 NOTE — Progress Notes (Signed)
[place] PHYSICAL MEDICINE & REHABILITATION     PROGRESS NOTE  Subjective/Complaints:  Patient seen sitting up in bed eating breakfast this morning. He states he didn't sleep that well but states that is only because there is so much going on in the hospital.  ROS: + DOE. Denies CP, SOB, N/V/D  Objective: Vital Signs: Blood pressure 117/62, pulse 69, temperature 97.5 F (36.4 C), temperature source Oral, resp. rate 18, height  (1.753 m), weight 62.9 kg (138 lb 10.7 oz), SpO2 97 %. No results found.  Recent Labs  02/09/16 0643  WBC 8.5  HGB 9.9*  HCT 30.9*  PLT 200    Recent Labs  02/09/16 0643  NA 132*  K 4.8  CL 98*  GLUCOSE 98  BUN 20  CREATININE 1.16  CALCIUM 8.0*   CBG (last 3)  No results for input(s): GLUCAP in the last 72 hours.  Wt Readings from Last 3 Encounters:  02/09/16 62.9 kg (138 lb 10.7 oz)  02/03/16 75.1 kg (165 lb 9.1 oz)  03/31/15 66.951 kg (147 lb 9.6 oz)    Physical Exam:  BP 117/62 mmHg  Pulse 69  Temp(Src) 97.5 F (36.4 C) (Oral)  Resp 18  Ht  (1.753 m)  Wt 62.9 kg (138 lb 10.7 oz)  BMI 20.47 kg/m2  SpO2 97% Constitutional: He appears well-developed and well-nourished. Nasal cannula in place. NAD HENT: Normocephalic and atraumatic.  Eyes: Conjunctivae and EOM are normal. Pupils are equal, round, and reactive to light. No scleral icterus.  Cardiovascular: An irregularly irregular rhythm present.   Respiratory: No respiratory distress. Unlabored breathing. Clear to auscultation. He exhibits no tenderness.  GI: Soft. Bowel sounds are normal. He exhibits no distension. There is no tenderness.  Musculoskeletal: He exhibits edema, 2+ B/l LE.   Neurological: He is alert and oriented.  Speech clear.  Motor:  Right upper extremity 5/5 proximal distal Left upper extremity 4+/5 proximally, 5/5 distally Right lower extremity 5/5 proximal distal Left lower extremity: 4+/5 hip flexion, knee extension, 5/5 ankle  dorsi/plantarflexion Skin: Skin is warm and dry.  Midline chest incision clean, dry and intact. Torso with dry dressing covering tubing bilaterally.  Psychiatric: His speech is normal. Cognition and memory are not impaired. Behavior and affect are normal.  Assessment/Plan: 1. Functional deficits secondary to sepsis and NSTEMI with subsequent CABG/ascending aneurysm repair which require 3+ hours per day of interdisciplinary therapy in a comprehensive inpatient rehab setting. Physiatrist is providing close team supervision and 24 hour management of active medical problems listed below. Physiatrist and rehab team continue to assess barriers to discharge/monitor patient progress toward functional and medical goals.  Function:  Bathing Bathing position   Position: Shower  Bathing parts Body parts bathed by patient: Right arm, Left arm, Chest, Abdomen, Front perineal area, Right upper leg, Left upper leg, Right lower leg, Left lower leg Body parts bathed by helper: Buttocks, Back  Bathing assist Assist Level: Touching or steadying assistance(Pt > 75%)      Upper Body Dressing/Undressing Upper body dressing   What is the patient wearing?: Pull over shirt/dress     Pull over shirt/dress - Perfomed by patient: Thread/unthread right sleeve, Thread/unthread left sleeve, Put head through opening, Pull shirt over trunk Pull over shirt/dress - Perfomed by helper: Pull shirt over trunk        Upper body assist Assist Level: Touching or steadying assistance(Pt > 75%)   Set up : To obtain clothing/put away  Lower Body Dressing/Undressing Lower  body dressing   What is the patient wearing?: Pants, Non-skid slipper socks     Pants- Performed by patient: Thread/unthread right pants leg, Thread/unthread left pants leg, Pull pants up/down   Non-skid slipper socks- Performed by patient: Don/doff right sock, Don/doff left sock                    Lower body assist Assist for lower body  dressing: Supervision or verbal cues   Set up : To obtain clothing/put away  Toileting Toileting   Toileting steps completed by patient: Adjust clothing prior to toileting, Adjust clothing after toileting Toileting steps completed by helper: Performs perineal hygiene Toileting Assistive Devices: Grab bar or rail  Toileting assist Assist level: Touching or steadying assistance (Pt.75%)   Transfers Chair/bed transfer   Chair/bed transfer method: Ambulatory Chair/bed transfer assist level: Supervision or verbal cues Chair/bed transfer assistive device: Armrests, Patent attorneyWalker     Locomotion Ambulation     Max distance: 300 Assist level: Supervision or verbal cues   Wheelchair   Type: Manual Max wheelchair distance: 160 Assist Level: Dependent (Pt equals 0%) (sternal precautions)  Cognition Comprehension Comprehension assist level: Follows complex conversation/direction with extra time/assistive device  Expression Expression assist level: Expresses complex ideas: With extra time/assistive device  Social Interaction Social Interaction assist level: Interacts appropriately with others with medication or extra time (anti-anxiety, antidepressant).  Problem Solving Problem solving assist level: Solves complex problems: Recognizes & self-corrects  Memory Memory assist level: More than reasonable amount of time    Medical Problem List and Plan: 1. Debility secondary to sepsis and NSTEMI with subsequent CABG/ascending aneurysm repair.   Continue CIR 2. DVT Prophylaxis/Anticoagulation: Pharmaceutical: Coumadin  INR therapeutic on 4/4 3.  Pain management: Continue oxycodone prn. Avoid Tylenol products due to shock liver 4. Mood: Team to provide ego support. LCSW to follow for evaluation and support.  5. Neuropsych: This patient is capable of making decisions on his own behalf. 6. Skin/Wound Care: Monitor nutrition and hydration status.  7. Fluids/Electrolytes/Nutrition: Monitor I/O.    Hyponatremia: 132 4/4 (stable)   Hyperkalemia: Resolved. 4.8 on 4/3. Will continue to monitor  Will start 40 Lasix on 4/4 8. CAD s/p CABG x 4: To continue sternal precautions. On ASA, BB--no statin due to shocked liver.  9. A fib: Monitor heart rate bid and with activity. Continue coumadin, metoprolol and digoxin.  10. COPD: On spiriva, Pulmicort nebs and Mucinex bid. Oxygen dependent at this time.  11. Leucocytosis: Resolved   Monitor for signs of infection.   Completed antibiotics 03/28.  12. Thrombocytopenia: Resolved  Continue to monitor platelets. Has been off heparin since surgery.   13. ABLA: Stable.   Hemoglobin 9.9 on 4/4  Will continue to monitor  14. Hypoalbuminemia  Supplementation started on 3/30  Slowly improving  LOS (Days) 6 A FACE TO FACE EVALUATION WAS PERFORMED  Ankit Karis Jubanil Patel 02/09/2016 8:51 AM

## 2016-02-09 NOTE — Progress Notes (Signed)
Occupational Therapy Session Note  Patient Details  Name: Craig [name] MRN: [id number] Date of Birth: [birth year]  Today's Date: 02/09/2016 OT Individual Time: 1415-1450 OT Individual Time Calculation (min): 35 min    Short Term Goals: Week 1:  OT Short Term Goal 1 (Week 1): STGs=LTGS secondary to estimated LOS     Skilled Therapeutic Interventions/Progress Updates:    Pt seen for skilled OT to facilitate functional mobility and activity tolerance to improve independence with ADLs. Pt received in recliner. Ambulated to gym with 4WW, used leg bicycle at resistance 3 for 12 min. He then stood at parallel bars for heel raises 15x, toe raises 15x, and alt knee raises 15x. Pt  Needed seated rest break and then ambulated back to room. Pt resting in recliner with all needs met.   Therapy Documentation Precautions:  Precautions Precautions: Fall, Sternal Precaution Comments: pt able to recall 2/3 sternal precautions and 3/3 with min question cues Restrictions Weight Bearing Restrictions: No Other Position/Activity Restrictions: sternal precautions    Vital Signs: Therapy Vitals Temp: 97.7 F (36.5 C) Temp Source: Oral Pulse Rate: 71 Resp: 18 BP: 120/67 mmHg Patient Position (if appropriate): Sitting Oxygen Therapy SpO2: 96 % O2 Device: Nasal Cannula O2 Flow Rate (L/min): 2 L/min Pain: Pain Assessment Pain Assessment: No/denies pain ADL:  See Function Navigator for Current Functional Status.   Therapy/Group: Individual Therapy  [place] 02/09/2016, 3:37 PM

## 2016-02-09 NOTE — Progress Notes (Signed)
Occupational Therapy Note  Patient Details  Name: Levert FeinsteinRobert S Smock MRN: [id number] Date of Birth: 05/28/1951  Today's Date: 02/09/2016 OT Individual Time: 1300-1330 OT Individual Time Calculation (min): 30 min   Pt denied pain Individual therapy  Pt resting in recliner upon arrival.  Pt amb with Rollator from room to ADL apartment and engaged in dynamic standing tasks and simulated home mgmt tasks.  Pt required rest break after ambulation to apartment.  O2 sats 97% on 2L O2.  Pt required rest break after standing activities.  Pt returned to room and recliner at end of session.  Pt amb with Rollator with one standing rest break.  O2 sats 97% on 2L O2. Focus on activity tolerance, functional amb with Rollator, dynamic standing balance, and safety awareness.   Lavone NeriLanier, Smiley Birr [facility] 02/09/2016, 2:16 PM

## 2016-02-09 NOTE — Consult Note (Signed)
INITIAL DIAGNOSTIC EVALUATION - [facility]   MEDICAL NECESSITY:  Craig [name] was seen on the [facility] for an initial diagnostic evaluation.   Records indicate that Craig [name] is a "65 year old male with history of HTN, ETOH abuse, COPD who was admitted on 12/26/15 with CAP and elevated cardiac enzymes. He was started on IV antibiotics as well as IV heparin for NSTEMI.  He developed ARDS requiring intubation on 02/19 and CT chest done revealing diffuse emphysema with ascending aortic aneurysm 5.2 cm.  Patient self extubated on 02/20 and treated with IV steroids for acute exacerbation of COPD with component of pulmonary edema.  2D echo with EF 35- 40%, with moderate concentric LVH, akinesis and heavily calcified AV.  Cardiac cath done revealing left main and 3V CAD. He developed PAF and was started on amiodarone for treatment.  He underwent CABG X 4 and replacement of ascending thoracic aneurysm on 03/02 by Dr. Bartle.  Post op had issues with acute on chronic renal failure, Abn LFTs due to shocked liver as well as hypotension with respiratory failure requiring reintubation on 03/06-3/7 due to septic shock.  He continued to have issues with hypoxia requiring BIPAP and underwent right thoracocentesis of 1400 cc on 3/11 and left thoracocentesis of 1300 cc on 3/12.   He has had issues with anxiety and deconditioning. He developed recurrent bilateral effusions requiring bilateral thoracocentesis with placement of pleurex catheters on 03/20. Post procedure with hypotension requiring pressors question due to sepsis and was started on IV Maxipime and Vancomycin with improvement. He remains afebrile with improvement in WBC.  Afib controlled on metoprolol and digoxin. Therapy ongoing and CIR recommended for follow up therapy."  During today's visit, Craig [name] denied experiencing any cognitive difficulties. Rehabilitation is focused on  increasing strength. He described being very fatigued and has been unable to sleep well. The patient mentioned that he use to smoke marijuana at night to sleep but he stopped doing this per his care provider's request. He has been given a prescription sleep aid (name unknown), but he never had it filled and does not want to take medications. He described variable sleep throughout the night secondary to rehabilitation staff disrupting his sleep, particularly in the early morning. He has been aggravated with his schedule but generally described his mood as "good." He has no history of mental illness or necessary treatment. He has reportedly been diagnosed with sleep apnea but does not use CPAP machine. No major adjustment issues endorsed. Suicidal/homicidal ideation, plan or intent was denied. No manic or hypomanic episodes were reported. The patient denied ever experiencing any auditory/visual hallucinations. No major behavioral or personality changes were endorsed.   Patient feels that progress is being made in therapy. No barriers to therapy identified. He has been satisfied with the rehabilitation team and feels that they treat him well. He has his family to support him throughout this endeavor. He currently lives with his 16-year-old son but family members will care for his son while he is getting better. His sister plans to help him with the transition home.  PROCEDURES: [1 unit 90791] Diagnostic clinical interview  Review of available records  IMPRESSION: Craig [name] denied experiencing any cognitive deficits and no overt cognitive issues were observed. His main complaint was poor sleep secondary to his schedule. Overall mood has been good. I ask that social work speak to the treatment team to see if his schedule can be modified to accommodate the   sleep disturbance (i.e., therapies starting a little later in the morning). Patient is also not being treated for sleep apnea which could be contributing to  poor sleep and fatigue.  At this time, no medication recommendations offered, though a sleep aid could be helpful but the patient did not wish to take this type of medication. Neuropsychology will informally follow up with the patient throughout this admission. No cognitive testing warranted.   Craig [name] Craig [name], Psy.D.  Clinical Neuropsychologist   

## 2016-02-09 NOTE — Consult Note (Signed)
INITIAL DIAGNOSTIC EVALUATION - [facility]   MEDICAL NECESSITY:  Reynoldo Mainer was seen on the [facility] for an initial diagnostic evaluation.   Records indicate that Mr. Adria Devon is a "65 year old male with history of HTN, ETOH abuse, COPD who was admitted on 12/26/15 with CAP and elevated cardiac enzymes. He was started on IV antibiotics as well as IV heparin for NSTEMI.  He developed ARDS requiring intubation on 02/19 and CT chest done revealing diffuse emphysema with ascending aortic aneurysm 5.2 cm.  Patient self extubated on 02/20 and treated with IV steroids for acute exacerbation of COPD with component of pulmonary edema.  2D echo with EF 35- 40%, with moderate concentric LVH, akinesis and heavily calcified AV.  Cardiac cath done revealing left main and 3V CAD. He developed PAF and was started on amiodarone for treatment.  He underwent CABG X 4 and replacement of ascending thoracic aneurysm on 03/02 by Dr. Laneta Simmers.  Post op had issues with acute on chronic renal failure, Abn LFTs due to shocked liver as well as hypotension with respiratory failure requiring reintubation on 03/06-3/7 due to septic shock.  He continued to have issues with hypoxia requiring BIPAP and underwent right thoracocentesis of 1400 cc on 3/11 and left thoracocentesis of 1300 cc on 3/12.   He has had issues with anxiety and deconditioning. He developed recurrent bilateral effusions requiring bilateral thoracocentesis with placement of pleurex catheters on 03/20. Post procedure with hypotension requiring pressors question due to sepsis and was started on IV Maxipime and Vancomycin with improvement. He remains afebrile with improvement in WBC.  Afib controlled on metoprolol and digoxin. Therapy ongoing and CIR recommended for follow up therapy."  During today's visit, Mr. Fullam denied experiencing any cognitive difficulties. Rehabilitation is focused on  increasing strength. He described being very fatigued and has been unable to sleep well. The patient mentioned that he use to smoke marijuana at night to sleep but he stopped doing this per his care provider's request. He has been given a prescription sleep aid (name unknown), but he never had it filled and does not want to take medications. He described variable sleep throughout the night secondary to rehabilitation staff disrupting his sleep, particularly in the early morning. He has been aggravated with his schedule but generally described his mood as "good." He has no history of mental illness or necessary treatment. He has reportedly been diagnosed with sleep apnea but does not use CPAP machine. No major adjustment issues endorsed. Suicidal/homicidal ideation, plan or intent was denied. No manic or hypomanic episodes were reported. The patient denied ever experiencing any auditory/visual hallucinations. No major behavioral or personality changes were endorsed.   Patient feels that progress is being made in therapy. No barriers to therapy identified. He has been satisfied with the rehabilitation team and feels that they treat him well. He has his family to support him throughout this endeavor. He currently lives with his 90 year old son but family members will care for his son while he is getting better. His sister plans to help him with the transition home.  PROCEDURES: [1 unit 90791] Diagnostic clinical interview  Review of available records  IMPRESSION: Mr. Fife denied experiencing any cognitive deficits and no overt cognitive issues were observed. His main complaint was poor sleep secondary to his schedule. Overall mood has been good. I ask that social work speak to the treatment team to see if his schedule can be modified to accommodate the  sleep disturbance (i.e., therapies starting a little later in the morning). Patient is also not being treated for sleep apnea which could be contributing to  poor sleep and fatigue.  At this time, no medication recommendations offered, though a sleep aid could be helpful but the patient did not wish to take this type of medication. Neuropsychology will informally follow up with the patient throughout this admission. No cognitive testing warranted.   Debbe MountsAdam T. Markevious Ehmke, Psy.D.  Clinical Neuropsychologist

## 2016-02-10 ENCOUNTER — Inpatient Hospital Stay (HOSPITAL_COMMUNITY): Payer: Medicaid Other

## 2016-02-10 ENCOUNTER — Inpatient Hospital Stay (HOSPITAL_COMMUNITY): Payer: Medicaid Other | Admitting: Occupational Therapy

## 2016-02-10 ENCOUNTER — Inpatient Hospital Stay (HOSPITAL_COMMUNITY): Payer: Self-pay | Admitting: Physical Therapy

## 2016-02-10 DIAGNOSIS — N179 Acute kidney failure, unspecified: Secondary | ICD-10-CM

## 2016-02-10 LAB — BASIC METABOLIC PANEL
ANION GAP: 9 (ref 5–15)
ANION GAP: 7 (ref 5–15)
BUN: 19 mg/dL (ref 6–20)
BUN: 19 mg/dL (ref 6–20)
CALCIUM: 8.5 mg/dL — AB (ref 8.9–10.3)
CALCIUM: 8.7 mg/dL — AB (ref 8.9–10.3)
CO2: 30 mmol/L (ref 22–32)
CO2: 32 mmol/L (ref 22–32)
Chloride: 99 mmol/L — ABNORMAL LOW (ref 101–111)
Chloride: 98 mmol/L — ABNORMAL LOW (ref 101–111)
Creatinine, Ser: 1.33 mg/dL — ABNORMAL HIGH (ref 0.61–1.24)
Creatinine, Ser: 1.45 mg/dL — ABNORMAL HIGH (ref 0.61–1.24)
GFR calc Af Amer: >60 mL/min (ref >60)
GFR calc Af Amer: 57 mL/min — ABNORMAL LOW (ref >60)
GFR calc non Af Amer: 55 mL/min — ABNORMAL LOW (ref >60)
GFR calc non Af Amer: 49 mL/min — ABNORMAL LOW (ref >60)
GLUCOSE: 106 mg/dL — AB (ref 65–99)
GLUCOSE: 107 mg/dL — AB (ref 65–99)
Potassium: 5.7 mmol/L — ABNORMAL HIGH (ref 3.5–5.1)
Potassium: 5.6 mmol/L — ABNORMAL HIGH (ref 3.5–5.1)
Sodium: 138 mmol/L (ref 135–145)
Sodium: 137 mmol/L (ref 135–145)

## 2016-02-10 LAB — PROTIME-INR
INR: 2.08 — AB (ref 0.00–1.49)
PROTHROMBIN TIME: 23.2 s — AB (ref 11.6–15.2)

## 2016-02-10 LAB — MAGNESIUM: Magnesium: 1.7 mg/dL (ref 1.7–2.4)

## 2016-02-10 MED ORDER — WARFARIN SODIUM 2.5 MG PO TABS
2.5000 mg | ORAL_TABLET | Freq: Once | ORAL | Status: AC
  Administered 2016-02-10: 2.5 mg via ORAL
  Filled 2016-02-10: qty 1

## 2016-02-10 MED ORDER — SODIUM CHLORIDE 0.9 % IV SOLN: 75.0000 mL/h | INTRAVENOUS | Status: DC

## 2016-02-10 MED ORDER — SODIUM POLYSTYRENE SULFONATE 15 GM/60ML PO SUSP
30.0000 g | Freq: Once | ORAL | Status: AC
  Administered 2016-02-10: 30 g via ORAL
  Filled 2016-02-10: qty 120

## 2016-02-10 NOTE — Progress Notes (Signed)
[place] PHYSICAL MEDICINE & REHABILITATION     PROGRESS NOTE  Subjective/Complaints:  Patient seen sitting up in bed this morning. He is a little upset because he did not receive his breakfast this morning.  ROS: + DOE. Denies CP, SOB, N/V/D  Objective: Vital Signs: Blood pressure 113/57, pulse 60, temperature 97.9 F (36.6 C), temperature source Oral, resp. rate 17, height 5\' 9"  (1.753 m), weight 64.3 kg (141 lb 12.1 oz), SpO2 100 %. No results found.  Recent Labs  02/09/16 0643  WBC 8.5  HGB 9.9*  HCT 30.9*  PLT 200    Recent Labs  02/09/16 0643 02/10/16 0704  NA 132* 138  K 4.8 5.7*  CL 98* 99*  GLUCOSE 98 106*  BUN 20 19  CREATININE 1.16 1.33*  CALCIUM 8.0* 8.5*   CBG (last 3)  No results for input(s): GLUCAP in the last 72 hours.  Wt Readings from Last 3 Encounters:  02/10/16 64.3 kg (141 lb 12.1 oz)  02/03/16 75.1 kg (165 lb 9.1 oz)  03/31/15 66.951 kg (147 lb 9.6 oz)    Physical Exam:  BP 113/57 mmHg  Pulse 60  Temp(Src) 97.9 F (36.6 C) (Oral)  Resp 17  Ht 5\' 9"  (1.753 m)  Wt 64.3 kg (141 lb 12.1 oz)  BMI 20.92 kg/m2  SpO2 100% Constitutional: He appears well-developed and well-nourished. Nasal cannula in place. NAD HENT: Normocephalic and atraumatic.  Eyes: Conjunctivae and EOM are normal. Pupils are equal, round, and reactive to light. No scleral icterus.  Cardiovascular: An irregularly irregular rhythm present.   Respiratory: No respiratory distress. Unlabored breathing. Clear to auscultation. He exhibits no tenderness.  GI: Soft. Bowel sounds are normal. He exhibits no distension. There is no tenderness.  Musculoskeletal: He exhibits edema, 2+ B/l LE.   Neurological: He is alert and oriented.  Speech clear.  Motor:  Right upper extremity 5/5 proximal distal Left upper extremity 4+/5 proximally, 5/5 distally Right lower extremity 5/5 proximal distal Left lower extremity: 4+/5 hip flexion, knee extension, 5/5 ankle  dorsi/plantarflexion Skin: Skin is warm and dry.  Midline chest incision clean, dry and intact. Torso with dry dressing covering tubing bilaterally.  Psychiatric: His speech is normal. Cognition and memory are not impaired. Behavior and affect are normal.  Assessment/Plan: 1. Functional deficits secondary to sepsis and NSTEMI with subsequent CABG/ascending aneurysm repair which require 3+ hours per day of interdisciplinary therapy in a comprehensive inpatient rehab setting. Physiatrist is providing close team supervision and 24 hour management of active medical problems listed below. Physiatrist and rehab team continue to assess barriers to discharge/monitor patient progress toward functional and medical goals.  Function:  Bathing Bathing position   Position: Shower  Bathing parts Body parts bathed by patient: Right arm, Left arm, Chest, Abdomen, Front perineal area, Right upper leg, Left upper leg, Right lower leg, Left lower leg Body parts bathed by helper: Buttocks, Back  Bathing assist Assist Level: Touching or steadying assistance(Pt > 75%)      Upper Body Dressing/Undressing Upper body dressing   What is the patient wearing?: Pull over shirt/dress     Pull over shirt/dress - Perfomed by patient: Thread/unthread right sleeve, Thread/unthread left sleeve, Put head through opening, Pull shirt over trunk Pull over shirt/dress - Perfomed by helper: Pull shirt over trunk        Upper body assist Assist Level: Touching or steadying assistance(Pt > 75%)   Set up : To obtain clothing/put away  Lower Body Dressing/Undressing Lower body  dressing   What is the patient wearing?: Pants, Non-skid slipper socks     Pants- Performed by patient: Thread/unthread right pants leg, Thread/unthread left pants leg, Pull pants up/down   Non-skid slipper socks- Performed by patient: Don/doff right sock, Don/doff left sock                    Lower body assist Assist for lower body  dressing: Supervision or verbal cues   Set up : To obtain clothing/put away  Toileting Toileting   Toileting steps completed by patient: Adjust clothing prior to toileting, Adjust clothing after toileting Toileting steps completed by helper: Performs perineal hygiene Toileting Assistive Devices: Grab bar or rail  Toileting assist Assist level: Touching or steadying assistance (Pt.75%)   Transfers Chair/bed transfer   Chair/bed transfer method: Ambulatory Chair/bed transfer assist level: Supervision or verbal cues Chair/bed transfer assistive device: Armrests, Patent attorney     Max distance: 150 Assist level: Supervision or verbal cues   Wheelchair   Type: Manual Max wheelchair distance: 160 Assist Level: Dependent (Pt equals 0%) (sternal precautions)  Cognition Comprehension Comprehension assist level: Follows complex conversation/direction with extra time/assistive device  Expression Expression assist level: Expresses complex ideas: With extra time/assistive device  Social Interaction Social Interaction assist level: Interacts appropriately with others with medication or extra time (anti-anxiety, antidepressant).  Problem Solving Problem solving assist level: Solves complex problems: Recognizes & self-corrects  Memory Memory assist level: More than reasonable amount of time    Medical Problem List and Plan: 1. Debility secondary to sepsis and NSTEMI with subsequent CABG/ascending aneurysm repair.   Continue CIR 2. DVT Prophylaxis/Anticoagulation: Pharmaceutical: Coumadin  INR therapeutic on 4/5 3.  Pain management: Continue oxycodone prn. Avoid Tylenol products due to shock liver 4. Mood: Team to provide ego support. LCSW to follow for evaluation and support.  5. Neuropsych: This patient is capable of making decisions on his own behalf. 6. Skin/Wound Care: Monitor nutrition and hydration status.  7. Fluids/Electrolytes/Nutrition: Monitor I/O.    Hyponatremia: 138 on 4/5   Hyperkalemia: 5.7 on 4/5. Will continue to monitor  Started 40 Lasix on 4/4 8. CAD s/p CABG x 4: To continue sternal precautions. On ASA, BB--no statin due to shocked liver.  9. A fib: Monitor heart rate bid and with activity. Continue coumadin, metoprolol and digoxin.  10. COPD: On spiriva, Pulmicort nebs and Mucinex bid. Oxygen dependent this time.  11. Leucocytosis: Resolved   Monitor for signs of infection.   Completed antibiotics 03/28.  12. Thrombocytopenia: Resolved  Continue to monitor platelets. Has been off heparin since surgery.   13. ABLA: Stable.   Hemoglobin 9.9 on 4/4  Will continue to monitor  14. Hypoalbuminemia  Supplementation started on 3/30  Slowly improving 15. ?AKI  Possibly secondary to Lasix, however all electrolytes elevated, will repeat labs, including digoxin  LOS (Days) 7 A FACE TO FACE EVALUATION WAS PERFORMED  Anwitha Mapes Karis Juba 02/10/2016 9:02 AM

## 2016-02-10 NOTE — Progress Notes (Signed)
ANTICOAGULATION CONSULT NOTE - Follow Up Consult  Pharmacy Consult for coumadin Indication: prosthetic AV  No Known Allergies  Patient Measurements: Height: 5\' 9"  (175.3 cm) Weight: 141 lb 12.1 oz (64.3 kg) IBW/kg (Calculated) : 70.7 Heparin Dosing Weight:   Vital Signs: Temp: 97.9 F (36.6 C) (04/05 0455) Temp Source: Oral (04/05 0455) BP: 113/57 mmHg (04/05 0455) Pulse Rate: 60 (04/05 0455)  Labs:  Recent Labs  02/08/16 0550 02/09/16 0643 02/10/16 0704  HGB  --  9.9*  --   HCT  --  30.9*  --   PLT  --  200  --   LABPROT 24.7* 25.0* 23.2*  INR 2.26* 2.30* 2.08*  CREATININE  --  1.16 1.33*    Estimated Creatinine Clearance: 51 mL/min (by C-G formula based on Cr of 1.33).   Medications:  Scheduled:  . arformoterol  15 mcg Nebulization BID  . aspirin EC  81 mg Oral Daily  . budesonide (PULMICORT) nebulizer solution  0.5 mg Nebulization BID  . digoxin  0.25 mg Oral Daily  . famotidine  20 mg Oral Daily  . furosemide  40 mg Oral Daily  . guaiFENesin  1,200 mg Oral BID  . metoprolol tartrate  12.5 mg Oral BID  . protein supplement  1 scoop Oral TID WC  . tiotropium  18 mcg Inhalation Daily  . Warfarin - Pharmacist Dosing Inpatient   Does not apply q1800   Infusions:    Assessment: 65 yo male with prosthetic AV is currently on therapeutic coumadin.  INR today is 2.08 Goal of Therapy:  INR 2-2.5 Monitor platelets by anticoagulation protocol: Yes   Plan:  - Coumadin 2.5 mg PO today - Daily PT / INR   Tahisha Hakim, Tsz-Yin 02/10/2016,8:30 AM

## 2016-02-10 NOTE — Progress Notes (Signed)
Physical Therapy Session Note  Patient Details  Name: Craig [name] MRN: [id number] Date of Birth: March 01, 1951  Today's Date: 02/10/2016 PT Individual Time:  -      Short Term Goals: Week 1:  PT Short Term Goal 1 (Week 1): =LTGs due to ELOS  Skilled Therapeutic Interventions/Progress Updates:    Pt received resting in recliner with no c/o pain, reports attempting to wean O2, agreeable to therapy.  Session focus on activity tolerance and gait training.  Pt requires extended rest breaks following each short bout of activity.  Pt amb to therapy gym with rollator supervision.  O2 upon arrival to gym 86% and required 2 minutes to recover to >90% on room air.  PT instructed pt in obstacle course focus on amb on compliant surfaces and negotiating obstacles with rollator and supervision.  O2 following obstacle course 90% on room air.  Pt amb to ortho gym with rollator and O2 90% on room air.  PT instructed pt in amb up/down ramp and on uneven surface in ortho gym and back to therapy apartment with no rest break on room air, O2 78% and required 3 minutes to recover to 90% on room air.  PT applied 1L of O2 via nasal cannula and O2 recovered to 94% in less than 1 minute.  Pt performed 12 min on Nustep at level 4 for overall endurance focus on education for pursed lip breathing and pacing.  Pt verbalized understanding of education.  Pt amb back to room at end of session and positioned in recliner with call bell in reach and needs met.   Therapy Documentation Precautions:  Precautions Precautions: Fall, Sternal Precaution Comments: pt able to recall 2/3 sternal precautions and 3/3 with min question cues Restrictions Weight Bearing Restrictions: No Other Position/Activity Restrictions: sternal precautions   See Function Navigator for Current Functional Status.   Therapy/Group: Individual Therapy  Earnest Conroy Penven-Crew 02/10/2016, 4:02 PM

## 2016-02-10 NOTE — Progress Notes (Signed)
Occupational Therapy Session Note  Patient Details  Name: Craig [name] MRN: [id number] Date of Birth: 12/17/1950  Today's Date: 02/10/2016 OT Individual Time: 1110-1200 and 1302 -1400 OT Individual Time Calculation (min): 50 min and 58 min    Short Term Goals: Week 1:  OT Short Term Goal 1 (Week 1): STGs=LTGS secondary to estimated LOS  Skilled Therapeutic Interventions/Progress Updates:   Session 1: Pt missing 10 minutes of session secondary to chest x ray. Pt arriving back to room with no c/o pain this session and agreeable to OT intervention. Pt performing sit <>stand with supervision and no verbal cues needed for technique. Pt ambulating with rollatorand OT assisting with management of oxygen. Pt ambulating 150' with supervision to ADL apartment. Pt taking several rest breaks this session secondary to fatigue. OT engaged in bed making without use of rollator and close supervision for task. Pt engaged in stand <>controlled sit utilizing mostly B LEs with slight boost from L UE on armrest with close supervision x 6 reps. Pt needing assistance to stand from chair if using B LEs only. Pt returning to room and sitting in recliner chair and discussing energy conservation techniques from previous session. Call bell and all needed items within reach upon exiting the room.   Session 2:  Upon entering the room, pt seated in recliner chair with no c/o pain this session. Pt transferred into wheelchair with supervision and OT propelled pt for energy conservation to outside entrance. Pt ambulated with RW ont variety of surfaces , slopes, and inclines with close supervision. Pt requiring multiple rest breaks this session secondary to fatigue. However, pt remained on room air and O2 remaining above 92% with this task. Pt returning to ADL apartment where he simulated shower transfer by stepping over and into tub with use of grab bar and steady assistance. O2 dropped to 80% and recovered to above 90% with rest  and pursed lip breathing within 90 seconds. O2 remained off of pt while he ambulated in hallway while pushing/walking behind wheelchair back to room 150' with supervision. O2 at 92% once seated within room. RN and NT alerted of trials on room air. Pt remained in recliner chair with call bell and all needed items within reach upon exiting the room.   Therapy Documentation Precautions:  Precautions Precautions: Fall, Sternal Precaution Comments: pt able to recall 2/3 sternal precautions and 3/3 with min question cues Restrictions Weight Bearing Restrictions: No Other Position/Activity Restrictions: sternal precautions General: General OT Amount of Missed Time: 10 Minutes  See Function Navigator for Current Functional Status.   Therapy/Group: Individual Therapy  Lowella Gripittman, [place] Krotz L 02/10/2016, 12:45 PM

## 2016-02-11 ENCOUNTER — Inpatient Hospital Stay (HOSPITAL_COMMUNITY): Payer: Self-pay | Admitting: Physical Therapy

## 2016-02-11 ENCOUNTER — Inpatient Hospital Stay (HOSPITAL_COMMUNITY): Payer: Medicaid Other | Admitting: Occupational Therapy

## 2016-02-11 ENCOUNTER — Other Ambulatory Visit: Payer: Self-pay | Admitting: Surgery

## 2016-02-11 ENCOUNTER — Telehealth: Payer: Self-pay | Admitting: *Deleted

## 2016-02-11 DIAGNOSIS — Z951 Presence of aortocoronary bypass graft: Secondary | ICD-10-CM

## 2016-02-11 LAB — PROTIME-INR
INR: 2.15 — ABNORMAL HIGH (ref 0.00–1.49)
Prothrombin Time: 23.8 seconds — ABNORMAL HIGH (ref 11.6–15.2)

## 2016-02-11 LAB — BASIC METABOLIC PANEL
ANION GAP: 11 (ref 5–15)
BUN: 19 mg/dL (ref 6–20)
CALCIUM: 8.4 mg/dL — AB (ref 8.9–10.3)
CHLORIDE: 97 mmol/L — AB (ref 101–111)
CO2: 28 mmol/L (ref 22–32)
CREATININE: 1.2 mg/dL (ref 0.61–1.24)
GFR calc non Af Amer: >60 mL/min (ref >60)
Glucose, Bld: 106 mg/dL — ABNORMAL HIGH (ref 65–99)
POTASSIUM: 5 mmol/L (ref 3.5–5.1)
SODIUM: 136 mmol/L (ref 135–145)

## 2016-02-11 LAB — DIGOXIN LEVEL: DIGOXIN LVL: 1.3 ng/mL (ref 0.8–2.0)

## 2016-02-11 MED ORDER — WARFARIN SODIUM 2.5 MG PO TABS
2.5000 mg | ORAL_TABLET | Freq: Once | ORAL | Status: AC
  Administered 2016-02-11: 2.5 mg via ORAL
  Filled 2016-02-11: qty 1

## 2016-02-11 NOTE — Plan of Care (Signed)
Problem: Food- and Nutrition-Related Knowledge Deficit (NB-1.1) Goal: Nutrition education Formal process to instruct or train a patient/client in a skill or to impart knowledge to help patients/clients voluntarily manage or modify food choices and eating behavior to maintain or improve health. Outcome: Completed/Met Date Met:  02/11/16 Nutrition Education Note  RD consulted for nutrition education regarding a Heart Healthy diet and a low potassium diet.   Lipid Panel     Component Value Date/Time    CHOL 157 12/27/2015 0220    TRIG 48 12/27/2015 0220    HDL 30* 12/27/2015 0220    CHOLHDL 5.2 12/27/2015 0220    VLDL 10 12/27/2015 0220    LDLCALC 117* 12/27/2015 0220    RD provided "Heart Healthy Nutrition Therapy" and "Potassium Content of Foods" handout from the Academy of [employer] Dietetics. Reviewed patient's dietary recall. Provided examples on ways to decrease sodium and fat intake in diet. Discouraged intake of processed foods and use of salt shaker. Encouraged fresh fruits and vegetables as well as whole grain sources of carbohydrates to maximize fiber intake. Identified foods that are high in potassium in patient's diet and the importance of limited or avoiding them to aid in kidney health. Pt reports usually consuming orange juice and bananas daily. Pt expressed understanding on limiting/avoiding them. Teach back method used.  Expect good compliance.  Corrin Parker, MS, RD, LDN Pager # [phone] After hours/ weekend pager # [phone]

## 2016-02-11 NOTE — Progress Notes (Signed)
Initial Nutrition Assessment  DOCUMENTATION CODES:   Non-severe (moderate) malnutrition in context of chronic illness  INTERVENTION:  Continue Beneprotein po 1 scoop TID with meals, each dose provides 25 kcal, and 6 grams of protein.  Encourage adequate PO intake.  Diet education given.   NUTRITION DIAGNOSIS:   Increased nutrient needs related to chronic illness as evidenced by estimated needs.  GOAL:   Patient will meet greater than or equal to 90% of their needs  MONITOR:   PO intake, Supplement acceptance, Weight trends, Labs, I & O's  REASON FOR ASSESSMENT:   Malnutrition Screening Tool, Consult Diet education  ASSESSMENT:   65 year old male with history of HTN, ETOH abuse, COPD who was admitted on 12/26/15 with CAP and elevated cardiac enzymes. He developed ARDS requiring intubation on 02/19 and CT chest done revealing diffuse emphysema with ascending aortic aneurysm 5.2 cm. Patient self extubated on 02/20 and treated with IV steroids for acute exacerbation of COPD with component of pulmonary edema. 2D echo with EF 35- 40%, with moderate concentric LVH, akinesis and heavily calcified AV. Cardiac cath done revealing left main and 3V CAD. He developed PAF and was started on amiodarone for treatment. He underwent CABG X 4 and replacement of ascending thoracic aneurysm on 03/02 Pt deconditioned.  Pt reports appetite is fine currently and PTA with usual consumption of at least 2 meals a day with no other difficulties. Meal completion has been 75-100%. Per Epic weight records, weight has been trending down, however is likely related to fluid status. Pt reports usual body weight is ~145 lbs. Pt currently has Beneprotein ordered and has been consuming it with meals. RD was additionally consulted for diet education. Education given. Plans for discharge on Saturday.   Nutrition-Focused physical exam completed. Findings are mpderate fat depletion, moderate muscle depletion, and  moderate to severe edema.   Labs and medications reviewed.   Diet Order:  Diet Heart Room service appropriate?: Yes; Fluid consistency:: Thin  Skin:  Wound (see comment) (Incision on chest)  Last BM:  4/6  Height:   Ht Readings from Last 1 Encounters:  02/03/16 5\' 9"  (1.753 m)    Weight:   Wt Readings from Last 1 Encounters:  02/11/16 141 lb 8.6 oz (64.2 kg)    Ideal Body Weight:  72.7 kg  BMI:  Body mass index is 20.89 kg/(m^2).  Estimated Nutritional Needs:   Kcal:  2000-2200  Protein:  90-105 grams  Fluid:  Per MD  EDUCATION NEEDS:   Education needs addressed  Roslyn SmilingStephanie Perrin Gens, MS, RD, LDN Pager # [phone] After hours/ weekend pager # [phone]

## 2016-02-11 NOTE — Progress Notes (Signed)
Occupational Therapy Session Note  Patient Details  Name: Craig [name] MRN: [id number] Date of Birth: 02/22/1951  Today's Date: 02/11/2016 OT Individual Time: 1100-1157 and 1345-1500 OT Individual Time Calculation (min): 57 min and 75 min    Short Term Goals: Week 1:  OT Short Term Goal 1 (Week 1): STGs=LTGS secondary to estimated LOS  Skilled Therapeutic Interventions/Progress Updates:  Session 1: Upon entering the room, pt seated in recliner chair awaiting therapist arrival. Pt with no c/o pain this session. He remains on 1L O2 via [place] this session with O2 saturation remaining above 93%. Pt ambulated 150' to ADL apartment with rollator and supervision for safety. Pt engaged in laundry task while seated for energy conservation. Pt utilizing rollator to ambulate to dresser and place items in drawers with supervision for safety. OT educated pt on kitchen set up for energy conservation and how to maintain sternal precautions while engaged in kitchen activities. Pt verbalized understanding. Pt returned to room in same manner as above with seated rest break secondary to fatigue. Call bell and all needed items within reach upon exiting the room.   Session 2: Upon entering the room, pt seated in recliner chair with no c/o pain this session. Pt ambulated with rollator to dresser to obtain clothing items needed for self care. Pt sitting in chair at sink side for bathing tasks. Pt able to perform all self care tasks with supervision and increased time. Pt needing frequent rest breaks secondary to fatigue but O2 remaining over 90%. Pt on room air until about 20 minutes left in session when pt returned to 1L O2 via [place]. Pt returned to recliner chair at end of session in same manner as before. Call bell and all needed items within reach upon exiting the room.   Therapy Documentation Precautions:  Precautions Precautions: Fall, Sternal Precaution Comments: pt able to recall 2/3 sternal precautions and 3/3  with min question cues Restrictions Weight Bearing Restrictions: No Other Position/Activity Restrictions: sternal precautions Vital Signs: Oxygen Therapy SpO2: 94 % O2 Device: Nasal Cannula O2 Flow Rate (L/min): 2 L/min  See Function Navigator for Current Functional Status.   Therapy/Group: Individual Therapy  Lowella Gripittman, Bintou Lafata L 02/11/2016, 12:38 PM

## 2016-02-11 NOTE — Progress Notes (Signed)
  Subjective:  No complaints. Feels like he is about ready to go home.  Objective: Vital signs in last 24 hours: Temp:  [97.7 F (36.5 C)-97.8 F (36.6 C)] 97.8 F (36.6 C) (04/06 0557) Pulse Rate:  [68-87] 68 (04/06 0557) Cardiac Rhythm:  [-]  Resp:  [16] 16 (04/06 0557) BP: (109-121)/(71-78) 109/71 mmHg (04/06 0557) SpO2:  [93 %-96 %] 94 % (04/06 1051) Weight:  [64.2 kg (141 lb 8.6 oz)] 64.2 kg (141 lb 8.6 oz) (04/06 0557)  Hemodynamic parameters for last 24 hours:    Intake/Output from previous day: 04/05 0701 - 04/06 0700 In: 960 [P.O.:960] Out: 1701 [Urine:1200; Stool:1; Chest Tube:500] Intake/Output this shift: Total I/O In: 240 [P.O.:240] Out: 300 [Urine:300]  General appearance: alert and cooperative Heart: irregular rhythm, soft systolic flow murmur Lungs: clear to auscultation bilaterally Extremities: edema mild bilateral lower leg edema Wound: incisions well-healed  Lab Results:  Recent Labs  02/09/16 0643  WBC 8.5  HGB 9.9*  HCT 30.9*  PLT 200   BMET:  Recent Labs  02/10/16 1033 02/11/16 0507  NA 137 136  K 5.6* 5.0  CL 98* 97*  CO2 32 28  GLUCOSE 107* 106*  BUN 19 19  CREATININE 1.45* 1.20  CALCIUM 8.7* 8.4*    PT/INR:  Recent Labs  02/11/16 0507  LABPROT 23.8*  INR 2.15*   ABG    Component Value Date/Time   PHART 7.366 01/12/2016 1529   HCO3 33.4* 01/12/2016 1529   TCO2 41 01/21/2016 0849   ACIDBASEDEF 3.0* 01/11/2016 0728   O2SAT 53.2 01/28/2016 0622   CBG (last 3)  No results for input(s): GLUCAP in the last 72 hours.  Assessment/Plan:  He is progressing well with rehab. Hemodynamics stable. PleurX catheter drainage is decreasing. When he goes home I will see him back in a week or two to check his catheter drainage and CXR. I will have my PA arrange.  LOS: 8 days    Alleen BorneBryan K Bartle 02/11/2016

## 2016-02-11 NOTE — Patient Care Conference (Signed)
Inpatient RehabilitationTeam Conference and Plan of Care Update Date: 02/10/2016   Time: 2:30 PM    Patient Name: Craig [name] [facility]      Medical Record Number: [id number]  Date of Birth: [birth year] Sex: Male         Room/Bed: 4W06C/4W06C-01 Payor Info: Payor: /    Admitting Diagnosis: debililty  CAB  AVR   Admit Date/Time:  02/03/2016  5:08 PM Admission Comments: No comment available   Primary Diagnosis:  <principal problem not specified> Principal Problem: <principal problem not specified>  Patient Active Problem List   Diagnosis Date Noted  . AKI (acute kidney injury) (HCC)   . Peripheral edema   . Hyperkalemia   . Hypoalbuminemia due to protein-calorie malnutrition (HCC)   . Debility 02/03/2016  . History of sepsis   . History of non-ST elevation myocardial infarction (NSTEMI)   . Coronary artery disease involving coronary bypass graft of native heart without angina pectoris   . History of aortic aneurysm repair   . Chronic obstructive pulmonary disease (HCC)   . Leukocytosis   . Hyponatremia   . Thrombocytopenia (HCC)   . Acute blood loss anemia   . Sepsis (HCC)   . S/P CABG (coronary artery bypass graft)   . Paroxysmal atrial fibrillation (HCC)   . S/P CABG x 4   . Pleural effusion   . Acute respiratory acidosis   . Acute renal failure (ARF) (HCC)   . Severe aortic stenosis   . Coronary artery disease involving native coronary artery of native heart without angina pectoris   . Coronary artery disease involving native coronary artery of native heart with unstable angina pectoris (HCC)   . Acute respiratory failure (HCC)   . CAD (coronary artery disease)   . Aortic stenosis   . Acute respiratory failure with hypoxia (HCC) 12/27/2015  . Elevated troponin   . History of hypertension   . History of hyperlipidemia   . Ascending aortic aneurysm (HCC)   . CAP (community acquired pneumonia) 12/26/2015  . NSTEMI (non-ST elevated myocardial infarction) (HCC) 12/26/2015  .  COPD (chronic obstructive pulmonary disease) (HCC) 12/26/2015  . Hypoxia 12/26/2015  . Ruptured lumbar disc 02/17/2015  . Back pain 02/17/2015    Expected Discharge Date: Expected Discharge Date: 02/13/16  Team Members Present: Physician leading conference: Dr. Maryla Morrow Social Worker Present: Amada Jupiter, LCSW PT Present: Teodoro Kil, Varney Biles, PT OT Present: Callie Fielding, OT SLP Present: Feliberto Gottron, SLP PPS Coordinator present : Tora Duck, RN, CRRN     Current Status/Progress Goal Weekly Team Focus  Medical   Debility secondary to sepsis and NSTEMI with subsequent CABG/ascending aneurysm repair with LE edema, AKI, hyperkalemia  Improve K+, kidney function, improve LE edema  see above   Bowel/Bladder   Patient continent of bowel and bladder, LBM 02/08/16  min assist  assess q shift   Swallow/Nutrition/ Hydration             ADL's   supervision overall  supervision  pt/family education, discharge planning, energy conservation edu   Mobility   supervision overall  supervision  d/c end of week   Communication             Safety/Cognition/ Behavioral Observations  no unsafe behavior  supervision  assess q shift   Pain   no complaints of pain  pain less than or equal to 2  assess q shift   Skin   3 black suture ties to chest/ midline chest incision  OTA with dermabond/bilateral LE edema-knee down/pleural vac  min assit  assess q shift    Rehab Goals Patient on target to meet rehab goals: Yes Rehab Goals Revised: none *See Care Plan and progress notes for long and short-term goals.  Barriers to Discharge: hyperkalemia, AKI    Possible Resolutions to Barriers:  gentle fluids, treat K+    Discharge Planning/Teaching Needs:  Pt to d/c to his sister's home until he is able to manage on his own and take care of his son.  Pt is independent in directing his care and has supervision level goals.     Team Discussion:  Pt is doing okay medically overall,  but will need medical clearance in regard to potassium levels, kidney function, and electrolytes.  Labs to be drawn prior to pt's d/c.  Pt is reaching supervision level with PT/OT which is his goal level.  Pt may need home oxygen and will need home health RN, PT, and OT.  DME - rollator and bedside commode already ordered for pt.  Revisions to Treatment Plan:  none   Continued Need for Acute Rehabilitation Level of Care: The patient requires daily medical management by a physician with specialized training in physical medicine and rehabilitation for the following conditions: Daily direction of a multidisciplinary physical rehabilitation program to ensure safe treatment while eliciting the highest outcome that is of practical value to the patient.: Yes Daily medical management of patient stability for increased activity during participation in an intensive rehabilitation regime.: Yes Daily analysis of laboratory values and/or radiology reports with any subsequent need for medication adjustment of medical intervention for : Cardiac problems;Post surgical problems;Nutritional problems;Renal problems  Betsey Sossamon, Vista DeckJennifer Capps 02/11/2016, 9:37 AM

## 2016-02-11 NOTE — Progress Notes (Signed)
Physical Therapy Session Note  Patient Details  Name: Craig [name] MRN: [id number] Date of Birth: January 25, 1951  Today's Date: 02/11/2016 PT Individual Time: 0906-0959 PT Individual Time Calculation (min): 53 min   Short Term Goals: Week 1:  PT Short Term Goal 1 (Week 1): =LTGs due to ELOS  Skilled Therapeutic Interventions/Progress Updates:    Pt received resting in recliner and agreeable to therapy session.  Session focus on LE strength, activity tolerance, and gait training.  Pt continues to require extended rest breaks throughout session to "catch breath." Pt amb throughout therapy session with rollator about 175' max on 1L of O2 via nasal cannula and O2 saturation maintained >90% throughout session.  Pt negotiated 8 steps with L hand rail and distant supervision.  PT instructed pt in 10 reps of standing LE therex with 2# ankle weights: minisquats, heel raises, hamstring curls, marching, and repeated sit<>stand.  PT instructed pt in car transfer with distant supervision.  Pt returned to room at end of session and positioned with call bell in reach and needs met.   Therapy Documentation Precautions:  Precautions Precautions: Fall, Sternal Precaution Comments: pt able to recall 2/3 sternal precautions and 3/3 with min question cues Restrictions Weight Bearing Restrictions: No Other Position/Activity Restrictions: sternal precautions   See Function Navigator for Current Functional Status.   Therapy/Group: Individual Therapy  Craig [name] 02/11/2016, 12:00 PM

## 2016-02-11 NOTE — Telephone Encounter (Signed)
Spoke with Pam at [facility] and she states pt is discharging on Saturday and will be new on coumadin due to PAF and will go home on coumadin 2.5mg  daily. [facility] . Instructed Pam when [facility] to order that they check INR on Monday April 10th and call results to coumadin clinic and she states will do so

## 2016-02-11 NOTE — Progress Notes (Signed)
[facility]     PROGRESS NOTE  Subjective/Complaints:  Patient seen lying in bed this morning. He states he couldn't sleep well because people Come in the room. 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  ROS: + DOE. Denies CP, SOB, N/V/D  Objective: Vital Signs: Blood pressure 109/71, pulse 68, temperature 97.8 F (36.6 C), temperature source Oral, resp. rate 16, height  (1.753 m), weight 64.2 kg (141 lb 8.6 oz), SpO2 96 %. Dg Chest 2 View  02/10/2016  CLINICAL DATA:  Hypoxia. Recent hospital discharge after heart surgery. History of COPD and hypertension. EXAM: CHEST  2 VIEW COMPARISON:  02/01/2016 FINDINGS: Changes from cardiac surgery are stable when compared to the prior exam. The cardiac silhouette is normal in size and configuration. No mediastinal or hilar masses or evidence of adenopathy. Lungs are hyperexpanded. There are small pleural effusions with associated linear and reticular lung base opacity, right greater than left, most likely atelectasis. No pulmonary edema. No pneumothorax. Bony thorax is demineralized. There are multiple old bilateral healed rib fractures. IMPRESSION: 1. Small pleural effusions with associated, right greater than left, lung base atelectasis. Atelectasis has mildly improved from the prior study. Pleural effusions are stable. 2. No convincing pneumonia. No evidence of pulmonary edema. No pneumothorax or mediastinal widening. Electronically Signed   By: Amie Portland M.D.   On: 02/10/2016 11:14    Recent Labs  02/09/16 0643  WBC 8.5  HGB 9.9*  HCT 30.9*  PLT 200    Recent Labs  02/10/16 1033 02/11/16 0507  NA 137 136  K 5.6* 5.0  CL 98* 97*  GLUCOSE 107* 106*  BUN 19 19  CREATININE 1.45* 1.20  CALCIUM 8.7* 8.4*   CBG (last 3)  No results for input(s): GLUCAP in the last 72 hours.  Wt Readings from Last 3 Encounters:  02/11/16 64.2 kg (141  lb 8.6 oz)  02/03/16 75.1 kg (165 lb 9.1 oz)  03/31/15 66.951 kg (147 lb 9.6 oz)    Physical Exam:  BP 109/71 mmHg  Pulse 68  Temp(Src) 97.8 F (36.6 C) (Oral)  Resp 16  Ht  (1.753 m)  Wt 64.2 kg (141 lb 8.6 oz)  BMI 20.89 kg/m2  SpO2 96% Constitutional: He appears well-developed and well-nourished. Nasal cannula in place. NAD HENT: Normocephalic and atraumatic.  Eyes: Conjunctivae and EOM are normal. Pupils are equal, round, and reactive to light. No scleral icterus.  Cardiovascular: An irregularly irregular rhythm present.   Respiratory: No respiratory distress. Unlabored breathing. Clear to auscultation. He exhibits no tenderness.  GI: Soft. Bowel sounds are normal. He exhibits no distension. There is no tenderness.  Musculoskeletal: He exhibits edema, 2+ B/l LE.   Neurological: He is alert and oriented.  Speech clear.  Motor:  Right upper extremity 5/5 proximal distal Left upper extremity 5/5 proximally, 5/5 distally Right lower extremity 5/5 proximal distal Left lower extremity: 4+/5 hip flexion, knee extension, 5/5 ankle dorsi/plantarflexion Skin: Skin is warm and dry.  Midline chest incision clean, dry and intact. Torso with dry dressing covering tubing bilaterally.  Psychiatric: His speech is normal. Cognition and memory are not impaired. Behavior and affect are normal.  Assessment/Plan: 1. Functional deficits secondary to sepsis and NSTEMI with subsequent CABG/ascending aneurysm repair which require 3+ hours per day of interdisciplinary therapy in a comprehensive inpatient rehab setting. Physiatrist is providing close team supervision and  24 hour management of active medical problems listed below. Physiatrist and rehab team continue to assess barriers to discharge/monitor patient progress toward functional and medical goals.  Function:  Bathing Bathing position   Position: Shower  Bathing parts Body parts bathed by patient: Right arm, Left arm, Chest,  Abdomen, Front perineal area, Right upper leg, Left upper leg, Right lower leg, Left lower leg Body parts bathed by helper: Buttocks, Back  Bathing assist Assist Level: Touching or steadying assistance(Pt > 75%)      Upper Body Dressing/Undressing Upper body dressing   What is the patient wearing?: Pull over shirt/dress     Pull over shirt/dress - Perfomed by patient: Thread/unthread right sleeve, Thread/unthread left sleeve, Put head through opening, Pull shirt over trunk Pull over shirt/dress - Perfomed by helper: Pull shirt over trunk        Upper body assist Assist Level: Touching or steadying assistance(Pt > 75%)   Set up : To obtain clothing/put away  Lower Body Dressing/Undressing Lower body dressing   What is the patient wearing?: Pants, Non-skid slipper socks     Pants- Performed by patient: Thread/unthread right pants leg, Thread/unthread left pants leg, Pull pants up/down   Non-skid slipper socks- Performed by patient: Don/doff right sock, Don/doff left sock                    Lower body assist Assist for lower body dressing: Supervision or verbal cues   Set up : To obtain clothing/put away  Toileting Toileting Toileting activity did not occur: No continent bowel/bladder event Toileting steps completed by patient: Adjust clothing prior to toileting, Adjust clothing after toileting Toileting steps completed by helper: Performs perineal hygiene Toileting Assistive Devices: Grab bar or rail  Toileting assist Assist level: Touching or steadying assistance (Pt.75%)   Transfers Chair/bed transfer   Chair/bed transfer method: Ambulatory Chair/bed transfer assist level: Supervision or verbal cues Chair/bed transfer assistive device: Armrests, Patent attorneyWalker     Locomotion Ambulation     Max distance: 150 Assist level: Supervision or verbal cues   Wheelchair   Type: Manual Max wheelchair distance: 160 Assist Level: Dependent (Pt equals 0%) (sternal precautions)   Cognition Comprehension Comprehension assist level: Follows complex conversation/direction with extra time/assistive device  Expression Expression assist level: Expresses complex ideas: With extra time/assistive device  Social Interaction Social Interaction assist level: Interacts appropriately with others with medication or extra time (anti-anxiety, antidepressant).  Problem Solving Problem solving assist level: Solves complex problems: Recognizes & self-corrects  Memory Memory assist level: More than reasonable amount of time    Medical Problem List and Plan: 1. Debility secondary to sepsis and NSTEMI with subsequent CABG/ascending aneurysm repair.   Continue CIR 2. DVT Prophylaxis/Anticoagulation: Pharmaceutical: Coumadin  INR therapeutic on 4/5 3.  Pain management: Continue oxycodone prn. Avoid Tylenol products due to shock liver 4. Mood: Team to provide ego support. LCSW to follow for evaluation and support.  5. Neuropsych: This patient is capable of making decisions on his own behalf. 6. Skin/Wound Care: Monitor nutrition and hydration status.  7. Fluids/Electrolytes/Nutrition: Monitor I/O.   Hyponatremia: 136 on 4/6   Hyperkalemia: 5.0 on 4/6. Changed to low potassium diet. Digoxin level therapeutic on 4/6. Will continue to monitor  Started 40 Lasix on 4/4, DC'd on 4/6 8. CAD s/p CABG x 4: To continue sternal precautions. On ASA, BB--no statin due to shocked liver.  9. A fib: Monitor heart rate bid and with activity. Continue coumadin, metoprolol and digoxin.  10. COPD: On spiriva, Pulmicort nebs and Mucinex bid. Oxygen dependent this time.  11. Leucocytosis: Resolved   Monitor for signs of infection.   Completed antibiotics 03/28.  12. Thrombocytopenia: Resolved  Continue to monitor platelets. Has been off heparin since surgery.   13. ABLA: Stable.   Hemoglobin 9.9 on 4/4  Will continue to monitor  14. Hypoalbuminemia  Supplementation started on  3/30  Slowly improving 15. AKI: Resolved  Will DC Lasix and IVF     LOS (Days) 8 A FACE TO FACE EVALUATION WAS PERFORMED  Jamika Sadek Karis Juba 02/11/2016 8:49 AM

## 2016-02-11 NOTE — Progress Notes (Signed)
ANTICOAGULATION CONSULT NOTE - Follow Up Consult  Pharmacy Consult for coumadin Indication: prosthetic AV  No Known Allergies  Patient Measurements: Height: 5\' 9"  (175.3 cm) Weight: 141 lb 8.6 oz (64.2 kg) IBW/kg (Calculated) : 70.7 Heparin Dosing Weight:   Vital Signs: Temp: 97.8 F (36.6 C) (04/06 0557) Temp Source: Oral (04/06 0557) BP: 109/71 mmHg (04/06 0557) Pulse Rate: 68 (04/06 0557)  Labs:  Recent Labs  02/09/16 0643 02/10/16 0704 02/10/16 1033 02/11/16 0507  HGB 9.9*  --   --   --   HCT 30.9*  --   --   --   PLT 200  --   --   --   LABPROT 25.0* 23.2*  --  23.8*  INR 2.30* 2.08*  --  2.15*  CREATININE 1.16 1.33* 1.45* 1.20    Estimated Creatinine Clearance: 56.5 mL/min (by C-G formula based on Cr of 1.2).   Medications:  Scheduled:  . arformoterol  15 mcg Nebulization BID  . aspirin EC  81 mg Oral Daily  . budesonide (PULMICORT) nebulizer solution  0.5 mg Nebulization BID  . digoxin  0.25 mg Oral Daily  . famotidine  20 mg Oral Daily  . guaiFENesin  1,200 mg Oral BID  . metoprolol tartrate  12.5 mg Oral BID  . protein supplement  1 scoop Oral TID WC  . tiotropium  18 mcg Inhalation Daily  . Warfarin - Pharmacist Dosing Inpatient   Does not apply q1800   Infusions:  . sodium chloride      Assessment: 65 yo male with prosthetic AV is currently on therapeutic coumadin.  INR today is 2.15 Goal of Therapy:  INR 2-2.5 Monitor platelets by anticoagulation protocol: Yes   Plan:  - Coumadin 2.5 mg PO today - Daily PT / INR   Deneshia Zucker, Tsz-Yin 02/11/2016,8:26 AM

## 2016-02-12 ENCOUNTER — Inpatient Hospital Stay (HOSPITAL_COMMUNITY): Payer: Self-pay | Admitting: Physical Therapy

## 2016-02-12 ENCOUNTER — Inpatient Hospital Stay (HOSPITAL_COMMUNITY): Payer: Medicaid Other | Admitting: Occupational Therapy

## 2016-02-12 DIAGNOSIS — Z9981 Dependence on supplemental oxygen: Secondary | ICD-10-CM | POA: Diagnosis present

## 2016-02-12 LAB — BASIC METABOLIC PANEL
Anion gap: 14 (ref 5–15)
BUN: 20 mg/dL (ref 6–20)
CHLORIDE: 98 mmol/L — AB (ref 101–111)
CO2: 24 mmol/L (ref 22–32)
Calcium: 8.2 mg/dL — ABNORMAL LOW (ref 8.9–10.3)
Creatinine, Ser: 1.26 mg/dL — ABNORMAL HIGH (ref 0.61–1.24)
GFR calc non Af Amer: 59 mL/min — ABNORMAL LOW (ref >60)
Glucose, Bld: 100 mg/dL — ABNORMAL HIGH (ref 65–99)
POTASSIUM: 5.1 mmol/L (ref 3.5–5.1)
SODIUM: 136 mmol/L (ref 135–145)

## 2016-02-12 LAB — PROTIME-INR
INR: 2.17 — ABNORMAL HIGH (ref 0.00–1.49)
Prothrombin Time: 24 seconds — ABNORMAL HIGH (ref 11.6–15.2)

## 2016-02-12 MED ORDER — ASPIRIN 81 MG PO TBEC: 81.0000 mg | DELAYED_RELEASE_TABLET | Freq: Every day | ORAL | Status: DC

## 2016-02-12 MED ORDER — BUDESONIDE-FORMOTEROL FUMARATE 160-4.5 MCG/ACT IN AERO: 2.0000 | INHALATION_SPRAY | Freq: Two times a day (BID) | RESPIRATORY_TRACT | Status: DC

## 2016-02-12 MED ORDER — METOPROLOL TARTRATE 25 MG PO TABS: 12.5000 mg | ORAL_TABLET | Freq: Two times a day (BID) | ORAL | Status: DC

## 2016-02-12 MED ORDER — WARFARIN SODIUM 5 MG PO TABS: 2.5000 mg | ORAL_TABLET | Freq: Every day | ORAL | Status: DC

## 2016-02-12 MED ORDER — WARFARIN SODIUM 2.5 MG PO TABS: 2.5000 mg | ORAL_TABLET | Freq: Every day | ORAL | Status: DC

## 2016-02-12 MED ORDER — OXYCODONE HCL 5 MG PO TABS: 5.0000 mg | ORAL_TABLET | Freq: Two times a day (BID) | ORAL | Status: DC | PRN

## 2016-02-12 MED ORDER — DIGOXIN 250 MCG PO TABS: 0.2500 mg | ORAL_TABLET | Freq: Every day | ORAL | Status: DC

## 2016-02-12 MED ORDER — SODIUM POLYSTYRENE SULFONATE 15 GM/60ML PO SUSP
15.0000 g | Freq: Once | ORAL | Status: AC
  Administered 2016-02-12: 15 g via ORAL
  Filled 2016-02-12: qty 60

## 2016-02-12 MED ORDER — TIOTROPIUM BROMIDE MONOHYDRATE 18 MCG IN CAPS: 18.0000 ug | ORAL_CAPSULE | Freq: Every day | RESPIRATORY_TRACT | Status: DC

## 2016-02-12 MED ORDER — FAMOTIDINE 20 MG PO TABS: 20.0000 mg | ORAL_TABLET | Freq: Every day | ORAL | Status: DC

## 2016-02-12 MED ORDER — GUAIFENESIN ER 600 MG PO TB12: 1200.0000 mg | ORAL_TABLET | Freq: Two times a day (BID) | ORAL | Status: DC

## 2016-02-12 MED ORDER — SODIUM POLYSTYRENE SULFONATE 15 GM/60ML PO SUSP: 15.0000 g | Freq: Every day | ORAL | Status: DC

## 2016-02-12 MED ORDER — ASPIRIN 81 MG PO TBEC: 81.0000 mg | DELAYED_RELEASE_TABLET | Freq: Every day | ORAL | Status: AC

## 2016-02-12 MED ORDER — OXYCODONE HCL 5 MG PO TABS: 5.0000 mg | ORAL_TABLET | Freq: Every day | ORAL | Status: DC

## 2016-02-12 MED ORDER — SODIUM POLYSTYRENE SULFONATE 15 GM/60ML PO SUSP
15.0000 g | ORAL | Status: DC
Start: 2016-02-14 — End: 2016-02-19

## 2016-02-12 NOTE — Progress Notes (Signed)
ANTICOAGULATION CONSULT NOTE - Follow Up Consult  Pharmacy Consult for coumadin Indication: prosthetic AV  No Known Allergies  Patient Measurements: Height: 5\' 9"  (175.3 cm) Weight: 141 lb 8.6 oz (64.2 kg) IBW/kg (Calculated) : 70.7 Heparin Dosing Weight:   Vital Signs: Temp: 97.6 F (36.4 C) (04/07 0416) Temp Source: Oral (04/07 0416) BP: 101/68 mmHg (04/07 0416) Pulse Rate: 68 (04/07 0416)  Labs:  Recent Labs  02/10/16 0704 02/10/16 1033 02/11/16 0507 02/12/16 0557  LABPROT 23.2*  --  23.8* 24.0*  INR 2.08*  --  2.15* 2.17*  CREATININE 1.33* 1.45* 1.20 1.26*    Estimated Creatinine Clearance: 53.8 mL/min (by C-G formula based on Cr of 1.26).   Medications:  Scheduled:  . arformoterol  15 mcg Nebulization BID  . aspirin EC  81 mg Oral Daily  . budesonide (PULMICORT) nebulizer solution  0.5 mg Nebulization BID  . digoxin  0.25 mg Oral Daily  . famotidine  20 mg Oral Daily  . guaiFENesin  1,200 mg Oral BID  . metoprolol tartrate  12.5 mg Oral BID  . protein supplement  1 scoop Oral TID WC  . tiotropium  18 mcg Inhalation Daily  . Warfarin - Pharmacist Dosing Inpatient   Does not apply q1800   Infusions:     Assessment: 65 yo male with prosthetic AV is currently on therapeutic coumadin.  INR today is 2.17, stable on 2.5 mg daily  Goal of Therapy:  INR 2-2.5 Monitor platelets by anticoagulation protocol: Yes   Plan:  - Coumadin 2.5 mg daily - INR MWF  Bayard HuggerMei Sophiea Ueda, PharmD, BCPS  Clinical Pharmacist  Pager: [phone]   02/12/2016,12:58 PM

## 2016-02-12 NOTE — Discharge Instructions (Signed)
Inpatient Rehab Discharge Instructions  Craig [name] [facility] Discharge date and time:  02/12/16  Activities/Precautions/ Functional Status: Activity: no lifting, driving, or strenuous exercise for till cleared by MD. Continue sternal precautions till cleared surgeon.  Diet: cardiac diet Low potasium diet. Wound Care: keep wound clean and dry   Functional status:  ___ No restrictions     ___ Walk up steps independently _X__ 24/7 supervision/assistance   ___ Walk up steps with assistance ___ Intermittent supervision/assistance  ___ Bathe/dress independently _X__ Walk with walker     ___ Bathe/dress with assistance ___ Walk Independently    ___ Shower independently ___ Walk with assistance    ___ Shower with assistance _X__ No alcohol     ___ Return to work/school ________   COMMUNITY REFERRALS UPON DISCHARGE:   Home Health:   RN   Agency:  [facility] Phone:  [phone] Medical Equipment/Items Ordered:  Rollator; bedside commode; home oxygen  Agency/Supplier: [facility]          Phone:  [phone]   Special Instructions: 1. Wear oxygen at all times.  2. Avoid high potassium foods--especially fruits and vegitables.  3. No tobacco.    Potassium Content of Foods Potassium is a mineral found in many foods and drinks. It helps keep fluids and minerals balanced in your body and affects how steadily your heart beats. Potassium also helps control your blood pressure and keep your muscles and nervous system healthy. Certain health conditions and medicines may change the balance of potassium in your body. When this happens, you can help balance your level of potassium through the foods that you do or do not eat. Your health care provider or dietitian may recommend an amount of potassium that you should have each day. The following lists of foods provide the amount of potassium (in parentheses) per serving in each item. HIGH IN POTASSIUM  The following foods and beverages  have 200 mg or more of potassium per serving:  Apricots, 2 raw or 5 dry (200 mg).  Artichoke, 1 medium (345 mg).  Avocado, raw,  each (245 mg).  Banana, 1 medium (425 mg).  Beans, lima, or baked beans, canned,  cup (280 mg).  Beans, white, canned,  cup (595 mg).  Beef roast, 3 oz (320 mg).  Beef, ground, 3 oz (270 mg).  Beets, raw or cooked,  cup (260 mg).  Bran muffin, 2 oz (300 mg).  Broccoli,  cup (230 mg).  Brussels sprouts,  cup (250 mg).  Cantaloupe,  cup (215 mg).  Cereal, 100% bran,  cup (200-400 mg).  Cheeseburger, single, fast food, 1 each (225-400 mg).  Chicken, 3 oz (220 mg).  Clams, canned, 3 oz (535 mg).  Crab, 3 oz (225 mg).  Dates, 5 each (270 mg).  Dried beans and peas,  cup (300-475 mg).  Figs, dried, 2 each (260 mg).  Fish: halibut, tuna, cod, snapper, 3 oz (480 mg).  Fish: salmon, haddock, swordfish, perch, 3 oz (300 mg).  Fish, tuna, canned 3 oz (200 mg).  [place] fries, fast food, 3 oz (470 mg).  Granola with fruit and nuts,  cup (200 mg).  Grapefruit juice,  cup (200 mg).  Greens, beet,  cup (655 mg).  Honeydew melon,  cup (200 mg).  Kale, raw, 1 cup (300 mg).  Kiwi, 1 medium (240 mg).  Kohlrabi, rutabaga, parsnips,  cup (280 mg).  Lentils,  cup (365 mg).  Mango, 1 each (325 mg).  Milk, chocolate, 1  cup (420 mg).  Milk: nonfat, low-fat, whole, buttermilk, 1 cup (350-380 mg).  Molasses, 1 Tbsp (295 mg).  Mushrooms,  cup (280) mg.  Nectarine, 1 each (275 mg).  Nuts: almonds, peanuts, hazelnuts, [place], cashew, mixed, 1 oz (200 mg).  Nuts, pistachios, 1 oz (295 mg).  Orange, 1 each (240 mg).  Orange juice,  cup (235 mg).  Papaya, medium,  fruit (390 mg).  Peanut butter, chunky, 2 Tbsp (240 mg).  Peanut butter, smooth, 2 Tbsp (210 mg).  Pear, 1 medium (200 mg).  Pomegranate, 1 whole (400 mg).  Pomegranate juice,  cup (215 mg).  Pork, 3 oz (350 mg).  Potato chips, salted, 1 oz  (465 mg).  Potato, baked with skin, 1 medium (925 mg).  Potatoes, boiled,  cup (255 mg).  Potatoes, mashed,  cup (330 mg).  Prune juice,  cup (370 mg).  Prunes, 5 each (305 mg).  Pudding, chocolate,  cup (230 mg).  Pumpkin, canned,  cup (250 mg).  Raisins, seedless,  cup (270 mg).  Seeds, sunflower or pumpkin, 1 oz (240 mg).  Soy milk, 1 cup (300 mg).  Spinach,  cup (420 mg).  Spinach, canned,  cup (370 mg).  Sweet potato, baked with skin, 1 medium (450 mg).  Swiss chard,  cup (480 mg).  Tomato or vegetable juice,  cup (275 mg).  Tomato sauce or puree,  cup (400-550 mg).  Tomato, raw, 1 medium (290 mg).  Tomatoes, canned,  cup (200-300 mg).  [place], 3 oz (250 mg).  Wheat germ, 1 oz (250 mg).  Winter squash,  cup (250 mg).  Yogurt, plain or fruited, 6 oz (260-435 mg).  Zucchini,  cup (220 mg). MODERATE IN POTASSIUM The following foods and beverages have 50-200 mg of potassium per serving:  Apple, 1 each (150 mg).  Apple juice,  cup (150 mg).  Applesauce,  cup (90 mg).  Apricot nectar,  cup (140 mg).  Asparagus, small spears,  cup or 6 spears (155 mg).  Bagel, cinnamon raisin, 1 each (130 mg).  Bagel, egg or plain, 4 in., 1 each (70 mg).  Beans, green,  cup (90 mg).  Beans, yellow,  cup (190 mg).  Beer, regular, 12 oz (100 mg).  Beets, canned,  cup (125 mg).  Blackberries,  cup (115 mg).  Blueberries,  cup (60 mg).  Bread, whole wheat, 1 slice (70 mg).  Broccoli, raw,  cup (145 mg).  Cabbage,  cup (150 mg).  Carrots, cooked or raw,  cup (180 mg).  Cauliflower, raw,  cup (150 mg).  Celery, raw,  cup (155 mg).  Cereal, bran flakes, cup (120-150 mg).  Cheese, cottage,  cup (110 mg).  Cherries, 10 each (150 mg).  Chocolate, 1 oz bar (165 mg).  Coffee, brewed 6 oz (90 mg).  Corn,  cup or 1 ear (195 mg).  Cucumbers,  cup (80 mg).  Egg, large, 1 each (60 mg).  Eggplant,  cup (60  mg).  Endive, raw, cup (80 mg).  English muffin, 1 each (65 mg).  Fish, orange roughy, 3 oz (150 mg).  Frankfurter, beef or pork, 1 each (75 mg).  Fruit cocktail,  cup (115 mg).  Grape juice,  cup (170 mg).  Grapefruit,  fruit (175 mg).  Grapes,  cup (155 mg).  Greens: kale, turnip, collard,  cup (110-150 mg).  Ice cream or frozen yogurt, chocolate,  cup (175 mg).  Ice cream or frozen yogurt, vanilla,  cup (120-150 mg).  Lemons, limes, 1 each (  80 mg).  Lettuce, all types, 1 cup (100 mg).  Mixed vegetables,  cup (150 mg).  Mushrooms, raw,  cup (110 mg).  Nuts: walnuts, pecans, or macadamia, 1 oz (125 mg).  Oatmeal,  cup (80 mg).  Okra,  cup (110 mg).  Onions, raw,  cup (120 mg).  Peach, 1 each (185 mg).  Peaches, canned,  cup (120 mg).  Pears, canned,  cup (120 mg).  Peas, green, frozen,  cup (90 mg).  Peppers, green,  cup (130 mg).  Peppers, red,  cup (160 mg).  Pineapple juice,  cup (165 mg).  Pineapple, fresh or canned,  cup (100 mg).  Plums, 1 each (105 mg).  Pudding, vanilla,  cup (150 mg).  Raspberries,  cup (90 mg).  Rhubarb,  cup (115 mg).  Rice, wild,  cup (80 mg).  Shrimp, 3 oz (155 mg).  Spinach, raw, 1 cup (170 mg).  Strawberries,  cup (125 mg).  Summer squash  cup (175-200 mg).  Swiss chard, raw, 1 cup (135 mg).  Tangerines, 1 each (140 mg).  Tea, brewed, 6 oz (65 mg).  Turnips,  cup (140 mg).  Watermelon,  cup (85 mg).  Wine, red, table, 5 oz (180 mg).  Wine, white, table, 5 oz (100 mg). LOW IN POTASSIUM The following foods and beverages have less than 50 mg of potassium per serving.  Bread, white, 1 slice (30 mg).  Carbonated beverages, 12 oz (less than 5 mg).  Cheese, 1 oz (20-30 mg).  Cranberries,  cup (45 mg).  Cranberry juice cocktail,  cup (20 mg).  Fats and oils, 1 Tbsp (less than 5 mg).  Hummus, 1 Tbsp (32 mg).  Nectar: papaya, mango, or pear,  cup (35  mg).  Rice, white or brown,  cup (50 mg).  Spaghetti or macaroni,  cup cooked (30 mg).  Tortilla, flour or corn, 1 each (50 mg).  Waffle, 4 in., 1 each (50 mg).  Water chestnuts,  cup (40 mg).    This information is not intended to replace advice given to you by your health care provider. Make sure you discuss any questions you have with your health care provider.   Document Released: 06/07/2005 Document Revised: 10/29/2013 Document Reviewed: 09/20/2013 Elsevier Interactive Patient Education [employer]      My questions have been answered and I understand these instructions. I will adhere to these goals and the provided educational materials after my discharge from the hospital.  Patient/Caregiver Signature _______________________________ Date __________  Clinician Signature _______________________________________ Date __________  Please bring this form and your medication list with you to all your follow-up doctor's appointments.

## 2016-02-12 NOTE — Progress Notes (Signed)
Discharge instructions give by PA. Questions answered and patient discharged.

## 2016-02-12 NOTE — Progress Notes (Addendum)
Occupational Therapy Discharge Summary  Patient Details  Name: Craig [name] MRN: [id number] Date of Birth: 10/16/1951  Today's Date: 02/12/2016 OT Individual Time: [phone]  (42 min) Skilled OT intervention for activity tolerance, progress made and goals met.   Went over Acupuncturist.   Patient has met 8 of 8 long term goals due to improved activity tolerance, improved balance, postural control, ability to compensate for deficits and improved awareness.  Patient to discharge at overall Supervision level.  Patient's care partner is available to provide the necessary physical assistance at discharge.    Reasons goals not met: all goals met.    Recommendation:  Patient will not need skilled OT services  After discharge to home.  He has HEPcontinue to advance functional skills in the area of BADL and iADL.  Equipment: Rolator walker, 3n1, oxygen tank  Reasons for discharge: treatment goals met and discharge from hospital  Patient/family agrees with progress made and goals achieved: Yes  OT Discharge Precautions/Restrictions  Precautions Precautions: Fall;Sternal Precaution Comments: pt able to recall 2/3 sternal precautions and 3/3 with min question cues Restrictions Weight Bearing Restrictions: No Other Position/Activity Restrictions: sternal precautions    Pain 3/10 side   Overall Cognitive Status: Within Functional Limits for tasks assessed Arousal/Alertness: Awake/alert Orientation Level: Oriented X4 Memory: Appears intact Behaviors: Poor frustration tolerance Safety/Judgment: Appears intact Comments: pt easily frustrated and self limiting Sensation Sensation Light Touch: Appears Intact Stereognosis: Not tested Hot/Cold: Appears Intact Coordination Fine Motor Movements are Fluid and Coordinated: Yes Motor  Motor Motor: Within Functional Limits Motor - Skilled Clinical Observations: generalized weakness Mobility  Bed Mobility Bed Mobility: Supine to  Sit Supine to Sit: 4: Min guard;HOB flat Transfers Sit to Stand: 5: Supervision Sit to Stand Details: Verbal cues for safe use of DME/AE;Verbal cues for sequencing;Verbal cues for technique;Tactile cues for posture Stand to Sit: 5: Supervision Stand to Sit Details (indicate cue type and reason): Tactile cues for posture;Verbal cues for sequencing;Verbal cues for safe use of DME/AE;Verbal cues for technique  Trunk/Postural Assessment  Cervical Assessment Cervical Assessment: Exceptions to [facility] Thoracic Assessment Thoracic Assessment: Exceptions to [facility] Postural Control Postural Control: Deficits on evaluation Protective Responses: slow to resapond Postural Limitations: decreased segmental movement  Balance Balance Balance Assessed: Yes Dynamic Sitting Balance Sitting balance - Comments: Sitting in chair without back support, fatigues quickly.  Static Standing Balance Static Standing - Balance Support: Bilateral upper extremity supported Static Standing - Level of Assistance: 5: Stand by assistance Dynamic Standing Balance Dynamic Standing - Balance Support: Bilateral upper extremity supported Dynamic Standing - Level of Assistance: 5: Stand by assistance Extremity/Trunk Assessment RUE Assessment RUE Assessment: Not tested LUE Assessment LUE Assessment: Not tested   See Function Navigator for Current Functional Status.  Lisa Roca 02/12/2016, 2:49 PM

## 2016-02-12 NOTE — Discharge Summary (Signed)
Physician Discharge Summary  Patient ID: BRINSON TOZZI MRN: [id number] DOB/AGE: Aug 23, 1951 65 y.o.  Admit date: 02/03/2016 Discharge date: 02/12/2016  Discharge Diagnoses:  Principal Problem:   Debility Active Problems:   Pleural effusion, bilateral   History of sepsis   History of non-ST elevation myocardial infarction (NSTEMI)   Coronary artery disease involving coronary bypass graft of native heart without angina pectoris   History of aortic aneurysm repair   Chronic obstructive pulmonary disease (HCC)   Leukocytosis   Hyponatremia   Thrombocytopenia (HCC)   Acute blood loss anemia   Hyperkalemia   Hypoalbuminemia due to protein-calorie malnutrition (HCC)   Peripheral edema   AKI (acute kidney injury) (HCC)   Supplemental oxygen dependent   Discharged Condition: stable  Significant Diagnostic Studies: Dg Chest 2 View  02/10/2016  CLINICAL DATA:  Hypoxia. Recent hospital discharge after heart surgery. History of COPD and hypertension. EXAM: CHEST  2 VIEW COMPARISON:  02/01/2016 FINDINGS: Changes from cardiac surgery are stable when compared to the prior exam. The cardiac silhouette is normal in size and configuration. No mediastinal or hilar masses or evidence of adenopathy. Lungs are hyperexpanded. There are small pleural effusions with associated linear and reticular lung base opacity, right greater than left, most likely atelectasis. No pulmonary edema. No pneumothorax. Bony thorax is demineralized. There are multiple old bilateral healed rib fractures. IMPRESSION: 1. Small pleural effusions with associated, right greater than left, lung base atelectasis. Atelectasis has mildly improved from the prior study. Pleural effusions are stable. 2. No convincing pneumonia. No evidence of pulmonary edema. No pneumothorax or mediastinal widening. Electronically Signed   By: Amie Portland M.D.   On: 02/10/2016 11:14    Dg Chest 2 View  02/01/2016  CLINICAL DATA:  Aortic valve  replacement. EXAM: CHEST  2 VIEW COMPARISON:  01/28/2016. FINDINGS: Right PICC line in stable position. Bilateral chest tubes in stable position. No pneumothorax. Prior CABG. Prior cardiac valve replacement. Cardiomegaly with normal pulmonary vascularity. Low lung volumes with mild basilar atelectasis and/or infiltrate, improved from prior exam . Small bilateral pleural effusions. These are stable. IMPRESSION: 1. Right PICC line and bilateral chest tubes stable position. No pneumothorax. 2. Low lung volumes with mild basilar atelectasis and/or infiltrates, improved from prior exam. Stable small bilateral pleural effusions. 3. Prior CABG and cardiac valve replacement. Stable cardiomegaly. No pulmonary venous congestion. Electronically Signed   By: Maisie Fus  Register   On: 02/01/2016 07:37     Labs:  Basic Metabolic Panel:  Recent Labs Lab 02/09/16 4098 02/10/16 0704 02/10/16 1033 02/11/16 0507 02/12/16 0557  NA 132* 138 137 136 136  K 4.8 5.7* 5.6* 5.0 5.1  CL 98* 99* 98* 97* 98*  CO2 25 30 32 28 24  GLUCOSE 98 106* 107* 106* 100*  BUN CREATININE 1.16 1.33* 1.45* 1.20 1.26*  CALCIUM 8.0* 8.5* 8.7* 8.4* 8.2*  MG  --   --  1.7  --   --     CBC: CBC Latest Ref Rng 02/09/2016 02/05/2016 02/04/2016  WBC 4.0 - 10.5 K/uL 8.5 8.7 9.6  Hemoglobin 13.0 - 17.0 g/dL 1.1(B) 10.5(L) 10.2(L)  Hematocrit 39.0 - 52.0 % 30.9(L) 32.5(L) 33.4(L)  Platelets 150 - 400 K/uL 200 175 179     CBG: No results for input(s): GLUCAP in the last 168 hours.   Filed Vitals:   02/11/16 2001 02/12/16 0416  BP: 119/69 101/68  Pulse: 80 68  Temp:  97.6 F (36.4 C)  Resp: 16 18     Lab Results  Component Value Date   INR 2.17* 02/12/2016   INR 2.15* 02/11/2016   INR 2.08* 02/10/2016    Brief HPI:   Raynelle FanningRobert Clingerman is a 65 year old male with history of HTN, ETOH abuse, COPD who was admitted on 12/26/15 with CAP and elevated cardiac enzymes. He was started on IV antibiotics as well as IV  heparin for NSTEMI. He developed ARDS requiring intubation on 02/19 and CT chest done revealing diffuse emphysema with ascending aortic aneurysm 5.2 cm. Patient self extubated on 02/20 and treated with IV steroids for acute exacerbation of COPD with component of pulmonary edema. 2D echo with EF 35- 40%, with moderate concentric LVH, akinesis and heavily calcified AV. Cardiac cath done revealing left main and 3V CAD. He developed PAF and was started on amiodarone for treatment. He underwent CABG X 4 and replacement of ascending thoracic aneurysm on 03/02 by Dr. Laneta SimmersBartle. Post op had issues with acute on chronic renal failure, Abn LFTs due to shocked liver as well as hypotension with respiratory failure requiring reintubation on 03/06-3/7 due to septic shock. He continued to have issues with hypoxia requiring BIPAP and underwent right thoracocentesis of 1400 cc on 3/11 and left thoracocentesis of 1300 cc on 3/12.He developed recurrent bilateral effusions requiring bilateral thoracocentesis with placement of pleurex catheters on 03/20. Sepsis resolved and he's afebrile.  Afib controlled on metoprolol and digoxin. Therapy ongoing and CIR recommended for follow up therapy   Physical Exam:  BP 101/68 mmHg  Pulse 68  Temp(Src) 97.6 F (36.4 C) (Oral)  Resp 18  Ht 5\' 9"  (1.753 m)  Wt 64.2 kg (141 lb 8.6 oz)  BMI 20.89 kg/m2  SpO2 99% Constitutional: He appears well-developed and well-nourished. Nasal cannula in place. NAD HENT: Normocephalic and atraumatic.  Eyes: Conjunctivae and EOM are normal. Pupils are equal, round, and reactive to light. No scleral icterus.  Cardiovascular: An irregularly irregular rhythm present.   Respiratory: No respiratory distress. Unlabored breathing. Clear to auscultation. He exhibits no tenderness.  GI: Soft. Bowel sounds are normal. He exhibits no distension. There is no tenderness.  Musculoskeletal: He exhibits edema, 2+ B/l LE (slightly improved).    Neurological: He is alert and oriented.  Speech clear.  Motor:  Right upper extremity 5/5 proximal distal Left upper extremity 5/5 proximally, 5/5 distally Right lower extremity 5/5 proximal distal Left lower extremity: 4+/5 hip flexion, knee extension, 5/5 ankle dorsi/plantarflexion Skin: Skin is warm and dry.  Midline chest incision clean, dry and intact. Torso with dry dressing covering tubing bilaterally.  Psychiatric: His speech is normal. Cognition and memory are not impaired. Behavior and affect are normal.   Hospital Course: Les PouRobert S Donoghue was admitted to rehab 02/03/2016 for inpatient therapies to consist of PT and OT at least three hours five days a week. Past admission physiatrist, therapy team and rehab RN have worked together to provide customized collaborative inpatient rehab.  Blood pressures have been well controlled and no signs of CP or tachycardia noted with increase in activity level.  Po intake has improved with improvement in overall mentation.  He is continent of bowel and bladder.  Endurance has gradually improved but he continues to have hypoxia with activity and continues to require oxygen with activity.  He has been educated on IS and continues to do this every 2 hours while awake. Pleurex catheters are being drained daily and he continues to have 200-300 cc pleural fluid from each  tube. CXR prior to discharge shows improvement in atelectasis and small bilateral pleural effusions.    Follow up CBC shows that ABLA is stable without recurrent leucocytosis. Abnormal LFTs due to shocked liver are resolving.  Protein supplement was added due to moderate malnutrition and to help promote wound healing. Lytes have been followed with routine checks and he has had recurrent hyperkalemia. He was treated with kayexalate and has been educated on low potassium diet. Lasix was added to help with peripheral edema but was discontinued due to rise in creatinine. He has been educated on  need to increase intake of water for hydration and elevate BLE when seated. Digoxin level was checked on 4/6 and is therapeutic at 1.3.  Pharmacy has assisted with management of coumadin and INR is 2.17/therapeutic on 2.5 mg daily. HHRN to draw protime with results to [facility] and BMET with results to Dr. Allena Katz. Sternal incision is intact and is  healing well without signs or symptoms of infection.  He has made steady progress and is at supervision level at discharge.    Rehab course: During patient's stay in rehab weekly team conferences were held to monitor patient's progress, set goals and discuss barriers to discharge. At admission, patient required min to moderate assistance with basic self care tasks and moderate assistance with mobility. He has had improvement in activity tolerance, balance, postural control, ability to compensate for deficits as well as improvement in awareness. He is able to complete ADL tasks with supervision. He requires supervision for transfers and for ambulating  >300' with rollater. Family education was done with sister about need for supervision due to impulsivity and to reinforce safe DME use. He is to continue to use oxygen with activity as he desaturates to 87% with activity. He has been educated on HEP as physical therapy not covered under current diagnosis and he is to continue this daily after discharge to help improve endurance. HHRN has been arranged to help with Pleurex  Catheter drainage and for blood draws.     Disposition: 01-Home or Self Care  Diet: Heart healthy. Low potassium.   Special Instructions: 1. Continue sternal precautions till cleared by surgeon. No strenuous activity, pushing/pulling or driving. 2. Drain pleural tubes daily. 3. Continue low potassium diet. Drink plenty of water. 4. HHRN to draw BMET (results to Dr. Allena Katz)  and PT/INR (results to [facility] coumadin clinic)  on Monday 4/10      Discharge Instructions    Ambulatory referral to Physical  Medicine Rehab    Complete by:  As directed   Follow up in 1 week for debility/moderate complexity            Medication List    TAKE these medications        aspirin 81 MG EC tablet  Take 1 tablet (81 mg total) by mouth daily.     budesonide-formoterol 160-4.5 MCG/ACT inhaler  Commonly known as:  SYMBICORT  Inhale 2 puffs into the lungs 2 (two) times daily.     digoxin 0.25 MG tablet  Commonly known as:  LANOXIN  Take 1 tablet (0.25 mg total) by mouth daily.     famotidine 20 MG tablet  Commonly known as:  PEPCID  Take 1 tablet (20 mg total) by mouth daily.     guaiFENesin 600 MG 12 hr tablet  Commonly known as:  MUCINEX  Take 2 tablets (1,200 mg total) by mouth 2 (two) times daily. For cough     metoprolol tartrate 25  MG tablet  Commonly known as:  LOPRESSOR  Take 0.5 tablets (12.5 mg total) by mouth 2 (two) times daily.     oxyCODONE 5 MG immediate release tablet--Rx # 45 pills   Commonly known as:  Oxy IR/ROXICODONE  Take 1-2 tablets (5-10 mg total) by mouth 2 (two) times daily as needed for severe pain.     sodium polystyrene 15 GM/60ML suspension  Commonly known as:  KAYEXALATE  Take 60 mLs (15 g total) by mouth every other day.  Start taking on:  02/14/2016     tiotropium 18 MCG inhalation capsule  Commonly known as:  SPIRIVA  Place 1 capsule (18 mcg total) into inhaler and inhale daily.     warfarin 5 MG tablet  Commonly known as:  COUMADIN  Take 0.5 tablets (2.5 mg total) by mouth daily at 6 PM.       Follow-up Information    Follow up with Couillard, Lise Auer, PA-C On 02/23/2016.   Specialty:  Physician Assistant   Why:  @ 10 AM - [phone] (for this visit) + $100 (previous visit) owed to practice at time of visit   Contact information:   [address] [place] [address] [phone]       Follow up with Azalee Course, PA On 02/18/2016.   Specialties:  Cardiology, Radiology   Why:  Follow up appointment be there at 2:10 for 2:30 pm appointment    Contact information:   [address] [place] [address] [phone]       Follow up with Kalen Ratajczak Karis Juba, MD.   Specialty:  Physical Medicine and Rehabilitation   Why:  office will call you with follow up appointment   Contact information:   [address] [place] [address] [phone]       Follow up with Alleen Borne, MD On 02/25/2016.   Specialty:  Cardiothoracic Surgery   Why:  be there for appointment at 3 pm   Contact information:   [address] [place] [address] [phone]       Signed: Jacquelynn Cree 02/12/2016, 5:29 PM

## 2016-02-12 NOTE — Progress Notes (Signed)
Social Work Discharge Note  The overall goal for the admission was met for:   Discharge location: Yes - sister's home initially, then to his home  Length of Stay: Yes - 9 days  Discharge activity level: Yes - supervision level  Home/community participation: Yes  Services provided included: MD, RD, PT, OT, RN, TR, Pharmacy, Neuropsych and SW  Financial Services: Other: Pt has applied for Medicaid and Social Security Disability  Follow-up services arranged: Home Health: RN from [facility], DME: Rollator; bedside commode; home O2 and Patient/Family has no preference for HH/DME agencies  Comments (or additional information):  Pt to go to his sister's home initially to transition to being at his home with just his son.  Pt feels ready to go home after an extended hospitalization.  Pt will have home health for nursing only due to not meeting the Medicaid criteria for therapies.  Pt to use his home exercise program from therapists.  Pt will have DME and home O2 from [facility] and will have f/u with PCP PA, cardiology, and Dr. Posey Pronto.  Pt is eager to go home to be with his son and does not have any concerns at this time.  Patient/Family verbalized understanding of follow-up arrangements: Yes  Individual responsible for coordination of the follow-up plan: pt with support from his sister  Confirmed correct DME delivered: Trey Sailors 02/12/2016    Prevatt, Silvestre Mesi

## 2016-02-12 NOTE — Progress Notes (Signed)
[facility] PHYSICAL MEDICINE & REHABILITATION     PROGRESS NOTE  Subjective/Complaints:  Patient seen sitting up in bed this morning. He asks if he is going home tomorrow, because he needs to take care of his son.  ROS: + DOE. Denies CP, SOB, N/V/D  Objective: Vital Signs: Blood pressure 101/68, pulse 68, temperature 97.6 F (36.4 C), temperature source Oral, resp. rate 18, height  (1.753 m), weight 64.2 kg (141 lb 8.6 oz), SpO2 99 %. Dg Chest 2 View  02/10/2016  CLINICAL DATA:  Hypoxia. Recent hospital discharge after heart surgery. History of COPD and hypertension. EXAM: CHEST  2 VIEW COMPARISON:  02/01/2016 FINDINGS: Changes from cardiac surgery are stable when compared to the prior exam. The cardiac silhouette is normal in size and configuration. No mediastinal or hilar masses or evidence of adenopathy. Lungs are hyperexpanded. There are small pleural effusions with associated linear and reticular lung base opacity, right greater than left, most likely atelectasis. No pulmonary edema. No pneumothorax. Bony thorax is demineralized. There are multiple old bilateral healed rib fractures. IMPRESSION: 1. Small pleural effusions with associated, right greater than left, lung base atelectasis. Atelectasis has mildly improved from the prior study. Pleural effusions are stable. 2. No convincing pneumonia. No evidence of pulmonary edema. No pneumothorax or mediastinal widening. Electronically Signed   By: Amie Portland M.D.   On: 02/10/2016 11:14   No results for input(s): WBC, HGB, HCT, PLT in the last 72 hours.  Recent Labs  02/11/16 0507 02/12/16 0557  NA 136 136  K 5.0 5.1  CL 97* 98*  GLUCOSE 106* 100*  BUN 19 20  CREATININE 1.20 1.26*  CALCIUM 8.4* 8.2*   CBG (last 3)  No results for input(s): GLUCAP in the last 72 hours.  Wt Readings from Last 3 Encounters:  02/11/16 64.2 kg (141 lb 8.6 oz)  02/03/16 75.1 kg (165 lb 9.1 oz)  03/31/15 66.951 kg (147 lb 9.6 oz)    Physical  Exam:  BP 101/68 mmHg  Pulse 68  Temp(Src) 97.6 F (36.4 C) (Oral)  Resp 18  Ht  (1.753 m)  Wt 64.2 kg (141 lb 8.6 oz)  BMI 20.89 kg/m2  SpO2 99% Constitutional: He appears well-developed and well-nourished. Nasal cannula in place. NAD HENT: Normocephalic and atraumatic.  Eyes: Conjunctivae and EOM are normal. Pupils are equal, round, and reactive to light. No scleral icterus.  Cardiovascular: An irregularly irregular rhythm present.   Respiratory: No respiratory distress. Unlabored breathing. Clear to auscultation. He exhibits no tenderness.  GI: Soft. Bowel sounds are normal. He exhibits no distension. There is no tenderness.  Musculoskeletal: He exhibits edema, 2+ B/l LE (slightly improved).   Neurological: He is alert and oriented.  Speech clear.  Motor:  Right upper extremity 5/5 proximal distal Left upper extremity 5/5 proximally, 5/5 distally Right lower extremity 5/5 proximal distal Left lower extremity: 4+/5 hip flexion, knee extension, 5/5 ankle dorsi/plantarflexion Skin: Skin is warm and dry.  Midline chest incision clean, dry and intact. Torso with dry dressing covering tubing bilaterally.  Psychiatric: His speech is normal. Cognition and memory are not impaired. Behavior and affect are normal.  Assessment/Plan: 1. Functional deficits secondary to sepsis and NSTEMI with subsequent CABG/ascending aneurysm repair which require 3+ hours per day of interdisciplinary therapy in a comprehensive inpatient rehab setting. Physiatrist is providing close team supervision and 24 hour management of active medical problems listed below. Physiatrist and rehab team continue to assess barriers to discharge/monitor patient progress  toward functional and medical goals.  Function:  Bathing Bathing position   Position: Shower  Bathing parts Body parts bathed by patient: Right arm, Left arm, Chest, Abdomen, Front perineal area, Right upper leg, Left upper leg, Right lower leg,  Left lower leg, Buttocks Body parts bathed by helper: Back  Bathing assist Assist Level: Supervision or verbal cues      Upper Body Dressing/Undressing Upper body dressing   What is the patient wearing?: Pull over shirt/dress     Pull over shirt/dress - Perfomed by patient: Thread/unthread right sleeve, Thread/unthread left sleeve, Put head through opening, Pull shirt over trunk Pull over shirt/dress - Perfomed by helper: Pull shirt over trunk        Upper body assist Assist Level: Supervision or verbal cues   Set up : To obtain clothing/put away  Lower Body Dressing/Undressing Lower body dressing   What is the patient wearing?: Pants, Non-skid slipper socks     Pants- Performed by patient: Thread/unthread right pants leg, Thread/unthread left pants leg, Pull pants up/down   Non-skid slipper socks- Performed by patient: Don/doff right sock, Don/doff left sock                    Lower body assist Assist for lower body dressing: Supervision or verbal cues   Set up : To obtain clothing/put away  Toileting Toileting Toileting activity did not occur: No continent bowel/bladder event Toileting steps completed by patient: Adjust clothing prior to toileting, Adjust clothing after toileting Toileting steps completed by helper: Performs perineal hygiene Toileting Assistive Devices: Grab bar or rail  Toileting assist Assist level: Touching or steadying assistance (Pt.75%)   Transfers Chair/bed transfer   Chair/bed transfer method: Stand pivot, Ambulatory Chair/bed transfer assist level: Supervision or verbal cues Chair/bed transfer assistive device: Armrests, Patent attorney     Max distance: 200 Assist level: Supervision or verbal cues   Wheelchair   Type: Manual Max wheelchair distance: 160 Assist Level: Dependent (Pt equals 0%) (sternal precautions)  Cognition Comprehension Comprehension assist level: Follows complex conversation/direction with  extra time/assistive device  Expression Expression assist level: Expresses complex ideas: With extra time/assistive device  Social Interaction Social Interaction assist level: Interacts appropriately with others with medication or extra time (anti-anxiety, antidepressant).  Problem Solving Problem solving assist level: Solves complex problems: Recognizes & self-corrects  Memory Memory assist level: More than reasonable amount of time    Medical Problem List and Plan: 1. Debility secondary to sepsis and NSTEMI with subsequent CABG/ascending aneurysm repair.   Will consider DC today  Patient will need close follow-up as outpatient to monitor labs, specifically potassium and creatinine in light of heart disease. Will see patient for transitional care management in 1-2 weeks.  Continue supplemental O2 2. DVT Prophylaxis/Anticoagulation: Pharmaceutical: Coumadin  INR therapeutic on 4/7 3.  Pain management: Continue oxycodone prn. Avoid Tylenol products due to shock liver 4. Mood: Team to provide ego support. LCSW to follow for evaluation and support.  5. Neuropsych: This patient is capable of making decisions on his own behalf. 6. Skin/Wound Care: Monitor nutrition and hydration status.  7. Fluids/Electrolytes/Nutrition: Monitor I/O.   Hyponatremia: 136 on 4/7   Hyperkalemia: 5.1 on 4/7. Changed to low potassium diet. Digoxin level therapeutic on 4/6. Will continue to monitor  Started 40 Lasix on 4/4, DC'd on 4/6 8. CAD s/p CABG x 4: To continue sternal precautions. On ASA, BB--no statin due to shocked liver.  9. A fib:  Monitor heart rate bid and with activity. Continue coumadin, metoprolol and digoxin.  10. COPD: On spiriva, Pulmicort nebs and Mucinex bid. Oxygen dependent this time.  11. Leucocytosis: Resolved   Monitor for signs of infection.   Completed antibiotics 03/28.  12. Thrombocytopenia: Resolved  Continue to monitor platelets. Has been off heparin since surgery.    13. ABLA: Stable.   Hemoglobin 9.9 on 4/4  Will continue to monitor  14. Hypoalbuminemia  Supplementation started on 3/30  Slowly improving 15. AKI:   Lasix and IVF DC'd  Creatinine 1.26 on 4/7  Will encourage oral fluid intake 16. Peripheral edema  Elevate legs  Patient unable to tolerate Lasix    LOS (Days) 9 A FACE TO FACE EVALUATION WAS PERFORMED  Ankit Karis Jubanil Patel 02/12/2016 8:44 AM

## 2016-02-12 NOTE — Progress Notes (Signed)
Social Work Patient ID: Craig [name], male   DOB: 01/22/1951, 65 y.o.   MRN: [id number]   Dr. Allena KatzPatel has given medical clearance for pt to d/c home with sister today following therapies.  Tx team, pt and sister all aware and agreeable.  Houa Ackert, LCSW

## 2016-02-12 NOTE — Progress Notes (Signed)
Social Work Patient ID: Craig [name], male   DOB: [birth year], 65 y.o.   MRN: [id number]   CSW met with pt to update him on team conference discussion and talked about targeted d/c date of 02-13-16.  Pt is concerned about his 24 y/o son and wants to get home as soon as possible to help take care of him.  Pt's brother has been caring for him since pt came into the hospital in February and son is starting to push the limits with his uncle and pt feels he needs to supervise him.  CSW shared pt's concerns with Dr. Posey Pronto and Reesa Chew, PA and they will see if pt's labs are good, maybe he can go home 02-12-16.  Pt will have [facility] RN, but does not meet Medicaid criteria for therapies at home.  Pt has applied for Medicaid.  CSW ordered a rollator and bedside commode. Pt's sister has other DME.  CSW talked with pt's sister and she will be ready to pick pt up either day, whenever he is cleared for d/c.  Talked through [facility] RN and f/u appts with her.  Pt was appreciative of time on rehab and feels ready to go home.

## 2016-02-12 NOTE — Progress Notes (Signed)
Social Work Patient ID: Craig [name], male   DOB: 10/05/1951, 65 y.o.   MRN: [id number]   Per RN report:   SATURATION QUALIFICATIONS: (This note is used to comply with regulatory documentation for home oxygen)  Patient Saturations on Room Air at Rest = 92%  Patient Saturations on Room Air while Ambulating = 87%  Patient Saturations on 2 Liters of oxygen while Ambulating = 92%  Please briefly explain why patient needs home oxygen:  Pt with bilateral pleural effusions, severe CAD and s/p CABG as well as aortic valve stenosis.  Norberto Wishon, LCSW

## 2016-02-12 NOTE — Plan of Care (Signed)
Problem: RH SKIN INTEGRITY Goal: RH STG MAINTAIN SKIN INTEGRITY WITH ASSISTANCE STG Maintain Skin Integrity With min Assistance.  Outcome: Progressing Skin remains intact, will continue to monitor.

## 2016-02-12 NOTE — Progress Notes (Signed)
Physical Therapy Discharge Summary  Patient Details  Name: Craig [name] MRN: [id number] Date of Birth: 08-03-1951  Today's Date: 02/12/2016 PT Individual Time: 5035-4656 PT Individual Time Calculation (min): 30 min    Patient has met 8 of 8 long term goals due to improved activity tolerance, improved balance, increased strength and decreased pain.  Patient to discharge at an ambulatory level Supervision.   Patient's care partner is independent to provide the necessary supervision assistance at discharge.   Recommendation:  Patient will benefit from ongoing skilled PT services in home health setting to continue to advance safe functional mobility, address ongoing impairments in strength, endurance, and balance, and minimize fall risk.  Equipment: rollator  Reasons for discharge: treatment goals met  Patient/family agrees with progress made and goals achieved: Yes   Skilled Therapeutic Intervention: Pt received resting in bed with no c/o pain and agreeable to therapy session.  Pt declined out of room activity today, but this therapist assessed pt's ability to ambulate, negotiate stairs and ramp, perform all transfers, and amb on compliant surfaces in session on 4/6 and pt is performing at supervision or better for all mobility.  Pt amb throughout room with rollator and distant supervision to collect belongings for discharge later today.  Dynamic sitting and standing balance for dressing and packing throughout session.  Pt seated in recliner at end of session and left with call bell in reach and needs met.   PT Discharge Precautions/Restrictions Precautions Precautions: Fall;Sternal Precaution Comments: pt able to recall 3/3 sternal precautions but requires occasional cues to maintain Restrictions Weight Bearing Restrictions: No Other Position/Activity Restrictions: sternal precautions Vision/Perception  Vision - History Baseline Vision: No visual deficits  Cognition Overall  Cognitive Status: Within Functional Limits for tasks assessed Arousal/Alertness: Awake/alert Orientation Level: Oriented X4 Memory: Appears intact Behaviors: Poor frustration tolerance Safety/Judgment: Appears intact Comments: pt easily frustrated and self limiting Sensation Sensation Light Touch: Appears Intact Stereognosis: Not tested Hot/Cold: Appears Intact Coordination Gross Motor Movements are Fluid and Coordinated: Yes Fine Motor Movements are Fluid and Coordinated: Yes Motor  Motor Motor: Within Functional Limits Motor - Skilled Clinical Observations: generalized weakness Motor - Discharge Observations: strength improved from time of eval, ongoing LE edema somewhat limiting  Mobility Bed Mobility Bed Mobility: Supine to Sit Supine to Sit: 6: Modified independent (Device/Increase time);HOB elevated Transfers Transfers: Yes Sit to Stand: 5: Supervision Sit to Stand Details: Verbal cues for safe use of DME/AE;Verbal cues for sequencing;Verbal cues for technique;Tactile cues for posture Stand to Sit: 5: Supervision Stand to Sit Details (indicate cue type and reason): Tactile cues for posture;Verbal cues for sequencing;Verbal cues for safe use of DME/AE;Verbal cues for technique Locomotion  Ambulation Ambulation: Yes Ambulation/Gait Assistance: 5: Supervision Ambulation Distance (Feet):  (pt amb >300' yesterday but declined ambulation outside room today) Assistive device: Rollator Gait Gait: Yes Gait Pattern: Step-through pattern;Narrow base of support Stairs / Additional Locomotion Stairs:  (pt negotiated 8 steps with 1 rail and supervision on 4/6 and declined stair negotiation today) Wheelchair Mobility Wheelchair Mobility: No  Trunk/Postural Assessment  Sits with preference for rounded shoulders and forward flexed spine 2/2 sternal incision and Pleurx drains.  Postural Control Postural Control: Within Functional Limits Protective Responses: slow to  resapond Postural Limitations: decreased segmental movement  Balance Balance Balance Assessed: Yes Dynamic Sitting Balance Dynamic Sitting - Balance Support: No upper extremity supported;During functional activity Dynamic Sitting - Level of Assistance: 6: Modified independent (Device/Increase time) Sitting balance - Comments: LB dressing Static Standing Balance  Static Standing - Balance Support: Bilateral upper extremity supported Static Standing - Level of Assistance: 5: Stand by assistance Dynamic Standing Balance Dynamic Standing - Balance Support: During functional activity;Left upper extremity supported;Right upper extremity supported Dynamic Standing - Level of Assistance: 5: Stand by assistance   See Function Navigator for Current Functional Status.  Rakayla Ricklefs E Penven-Crew 02/12/2016, 4:10 PM

## 2016-02-12 NOTE — Progress Notes (Signed)
Occupational Therapy Session Note  Patient Details  Name: Levert FeinsteinRobert S Myricks MRN: [id number] Date of Birth: 06/30/1951  Today's Date: 02/12/2016 OT Individual Time:  - 1100-1210  (70 min)      Short Term Goals: Week 1:  OT Short Term Goal 1 (Week 1): STGs=LTGS secondary to estimated LOS      Skilled Therapeutic Interventions/Progress Updates:   Pt lying in bedwith 2 liters 02.  Ambulated with 4WW to bathroom.  Urinated 175 cc.  Pt ambulate with O2 (oxygen= 98%) to day room.  Rest break.  Ambulated to ADL kitchen.  Prepared cheese crackers and toss salad with supervision.  Used stool for energy conservation stratagies.  Ambulated back to room and transferred to bed.  Left pt in bed with call bell,phone within reach.       Therapy Documentation Precautions:  Precautions Precautions: Fall, Sternal Precaution Comments: pt able to recall 2/3 sternal precautions and 3/3 with min question cues Restrictions Weight Bearing Restrictions: No Other Position/Activity Restrictions: sternal precautions    Vital Signs: Therapy Vitals Temp: 97.6 F (36.4 C) Temp Source: Oral Pulse Rate: 68 Resp: 18 BP: 101/68 mmHg Patient Position (if appropriate): Lying Oxygen Therapy SpO2: 99 % O2 Device: Nasal Cannula O2 Flow Rate (L/min): 1 L/min Pain:4/10 back and side ; SEE MAR       See Function Navigator for Current Functional Status.   Therapy/Group: Individual Therapy  Humberto Sealsdwards, Karnisha Lefebre J 02/12/2016, 7:57 AM

## 2016-02-15 ENCOUNTER — Telehealth: Payer: Self-pay

## 2016-02-15 ENCOUNTER — Ambulatory Visit (INDEPENDENT_AMBULATORY_CARE_PROVIDER_SITE_OTHER): Payer: Self-pay | Admitting: Internal Medicine

## 2016-02-15 DIAGNOSIS — Z7189 Other specified counseling: Secondary | ICD-10-CM | POA: Insufficient documentation

## 2016-02-15 DIAGNOSIS — I480 Paroxysmal atrial fibrillation: Secondary | ICD-10-CM

## 2016-02-15 DIAGNOSIS — Z5181 Encounter for therapeutic drug level monitoring: Secondary | ICD-10-CM

## 2016-02-15 LAB — POCT INR: INR: 2.1

## 2016-02-15 NOTE — Telephone Encounter (Signed)
1. Are you/is patient experiencing any problems since coming home? Are there any questions regarding any aspect of care? No 2. Are there any questions regarding medications administration/dosing? Are meds being taken as prescribed? Patient should review meds with caller to confirm. Meds could not be confirmed at the time.  3. Have there been any falls? No 4. Has Home Health been to the house and/or have they contacted you? If not, have you tried to contact them? Can we help you contact them? HH RN to drain tubes, check coumadin level, and monitor VS.  5. Are bowels and bladder emptying properly? Are there any unexpected incontinence issues? If applicable, is patient following bowel/bladder programs? No issues. 6. Any fevers, problems with breathing, unexpected pain? No-wearing oxygen at all times.  7. Are there any skin problems or new areas of breakdown? No 8. Has the patient/family member arranged specialty MD follow up (ie cardiology/neurology/renal/surgical/etc)?  Can we help arrange? Appointments have been arranged. 9. Does the patient need any other services or support that we can help arrange? No 10. Are caregivers following through as expected in assisting the patient? Yes, sister. 11. Has the patient quit smoking, drinking alcohol, or using drugs as recommended? Pt is not smoking, drinking alcohol or using drugs.  Pt's sister is aware of appt on 02/25/16 @ 1:30.

## 2016-02-18 ENCOUNTER — Encounter: Payer: Self-pay | Admitting: Physician Assistant

## 2016-02-18 ENCOUNTER — Ambulatory Visit (INDEPENDENT_AMBULATORY_CARE_PROVIDER_SITE_OTHER): Payer: Self-pay | Admitting: Physician Assistant

## 2016-02-18 VITALS — BP 140/77 | HR 67 | Ht 69.0 in | Wt 140.4 lb

## 2016-02-18 DIAGNOSIS — I2510 Atherosclerotic heart disease of native coronary artery without angina pectoris: Secondary | ICD-10-CM

## 2016-02-18 DIAGNOSIS — I5043 Acute on chronic combined systolic (congestive) and diastolic (congestive) heart failure: Secondary | ICD-10-CM

## 2016-02-18 DIAGNOSIS — I480 Paroxysmal atrial fibrillation: Secondary | ICD-10-CM

## 2016-02-18 DIAGNOSIS — I25810 Atherosclerosis of coronary artery bypass graft(s) without angina pectoris: Secondary | ICD-10-CM

## 2016-02-18 DIAGNOSIS — Z954 Presence of other heart-valve replacement: Secondary | ICD-10-CM

## 2016-02-18 DIAGNOSIS — Z952 Presence of prosthetic heart valve: Secondary | ICD-10-CM

## 2016-02-18 MED ORDER — FUROSEMIDE 20 MG PO TABS: 20.0000 mg | ORAL_TABLET | Freq: Every day | ORAL | Status: DC

## 2016-02-18 MED ORDER — METOPROLOL TARTRATE 25 MG PO TABS: 25.0000 mg | ORAL_TABLET | Freq: Two times a day (BID) | ORAL | Status: DC

## 2016-02-18 NOTE — Progress Notes (Signed)
Cardiology Office Note   Date:  02/18/2016   ID:  Craig [name], DOB Oct 11, 1951, MRN [id number]  PCP:  Rose Fillers, PA-C  Cardiologist:   Dr. Clifton James  Chief Complaint  Patient presents with  . Follow-up    seen for Dr. Clifton James      History of Present Illness: Craig [name] is a 65 y.o. male who presents for post hospital follow-up. He has past medical history of COPD, HTN, HLD, history of alcohol abuse quit in 2004 and tobacco use. He had a prolonged recent hospitalization after presented with one day of diffuse chest discomfort in the setting of 6 days of fever, productive cough, dyspnea. He was found to be hypoxic with O2 saturation 91% in the ED due to pneumonia. He was also found to have dilated ascending aortic aneurysm and severe AS. He was intubated on 2/19 and self extubated on 12/28/2015. Echo on 12/28/2015 did show EF 35-40% with wall motion abnormality, severe AS, mild MR, 42 mm aortic root, akinetic RV apex. He eventually underwent cardiac catheterization on 12/29/2015 which showed three-vessel CAD with 50% ostial left main. Cardiothoracic surgery was consulted who recommended CABG with aortic root replacement and also aortic valve replacement for right pneumonia resolved. Presurgical eval showed severe COPD, patient understood the high risk and willing to proceed with surgery despite the risk. His hospitalization was also complicated by occurrence of episodes of PAF, he was started on amiodarone loading prior to surgery.  He underwent CABG 4 with LIMA to LAD, SVG to OM, seq SVG to PDA/PLA, aortic valve replacement using 25 mm Edwards Magna-ease pericardial valve, supra-coronary ascending aortic and hemi-arch replacement using a 30 mm Hemashield under deep hypothermic circulatory arrest, right axillary artery 8 mm Hemashield graft for arterial cannulization and antegrade cerebral perfusion. After the major surgery, he developed hypotension, metabolic acidosis with a  suspected diagnosis of sepsis. Despite this, all cultures has been negative. He did finish one-week course of vancomycin and imipenem. Chest x-ray on 3/13 showed bilateral basilar opacity consistent with pleural effusion. He eventually underwent bilateral thoracentesis which removed a 1800 mL of serosanguineous fluid on the left and 1400 mL of serosanguineous fluid on the right. Unfortunately the pleural effusion recurred and he eventually underwent insertion of bilateral pleurX catheter on 3/20. He was later moved to inpatient rehabilitation and discharged on 4/7. Total hospitalization is from 2/18 - 4/7. He presents today for outpatient cardiology follow-up. It appears he has been started on Coumadin in the hospital and amiodarone stopped. He is also on aspirin as well. He is also on metoprolol and digoxin.  Despite his prolonged hospitalization, he has been doing well after discharge. Surprisingly he continued to put out roughly 300 mL fluid from his PleurX catheter and this draining process does make his breathing much better. He denies any recurrent chest pain. His sternotomy scar is healing quite well. He does complain of increasing lower extremity edema. On physical exam his lung is clear which is very fortunate. He does not seems to have significant heart murmur on physical exam. 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 Review of his last echocardiogram obtained on 01/27/2016 showed EF 45-50%, paradoxical septal motion, no pericardial effusion. I think he is clearly fluid overloaded, I have decided to start on Lasix regimen, he is going to start at 40 mg for 3 days then go down to 20 mg daily.  He will need one week basic metabolic panel and CBC and 2 weeks follow-up. He is no longer on Lipitor, I think he was taken off the Lipitor as he had shocked liver and the elevated transaminase post procedure from  hypertension. I will recheck fasting lipid and LFT in one week if they are back to normal, I am more inclined to restart on 80 mg high-dose Lipitor   Past Medical History  Diagnosis Date  . COPD (chronic obstructive pulmonary disease) (HCC)     severe on PFT prior to CABG  . Hypertension   . Hyperlipidemia   . Alcohol abuse     Prior  . Tobacco abuse     Prior  . CAD (coronary artery disease) of bypass graft      4v CABG (LIMA to LAD, SVG to OM, seq SVG to PDA/PLA) by Dr. Laneta Simmers 01/07/2016  . dilated ascending aorta     s/p supra-coronary ascending aortic and hemi-arch replacement using a 30 mm Hemashield under deep hypothermic circulatory arrest during CABG 01/07/2016  . Severe aortic stenosis     s/p aortic valve replacement using 25 mm Edwards Magna-ease pericardial valve by Dr. Laneta Simmers during CABG 01/07/2016  . Persistent atrial fibrillation (HCC)     in the setting of NSTEMI and persisted after [facility] 01/2016  . Recurrent left pleural effusion     s/p PleurX drain, occured after CABG  . Recurrent right pleural effusion     s/p PleurX drain, occured after CABG    Past Surgical History  Procedure Laterality Date  . Ruptured disc repair  2009 or 2010  . Cardiac catheterization N/A 12/29/2015    Procedure: Right/Left Heart Cath and Coronary Angiography;  Surgeon: Peter M Swaziland, MD;  Location: [facility] INVASIVE CV LAB;  Service: Cardiovascular;  Laterality: N/A;  . Coronary artery bypass graft N/A 01/07/2016    Procedure: CORONARY ARTERY BYPASS GRAFTING (CABG), ON PUMP, TIMES FOUR, USING LEFT INTERNAL MAMMARY ARTERY, BILATERAL GREATER SAPHENOUS VEINS HARVESTED ENDOSCOPICALLY;  Surgeon: Alleen Borne, MD;  Location: [facility] OR;  Service: Open Heart Surgery;  Laterality: N/A;  LIMA to LAD, SVG to OM, SVG SEQUENTIALLY to PDA and PLB  . Tee without cardioversion N/A 01/07/2016    Procedure: TRANSESOPHAGEAL ECHOCARDIOGRAM (TEE);  Surgeon: Alleen Borne, MD;  Location: [facility] OR;  Service: Open Heart Surgery;   Laterality: N/A;  . Thoracic aortic aneurysm repair N/A 01/07/2016    Procedure: CIRC ARREST AND REPLACEMENT OF ASCENDING THORACIC  ANEURYSM;  Surgeon: Alleen Borne, MD;  Location: [facility] OR;  Service: Open Heart Surgery;  Laterality: N/A;  . Chest tube insertion Bilateral 01/25/2016    Procedure: INSERTION Bilateral PLEURAL DRAINAGE CATHETER;  Surgeon: Alleen Borne, MD;  Location: [facility] OR;  Service: Thoracic;  Laterality: Bilateral;     Current Outpatient Prescriptions  Medication Sig Dispense Refill  . aspirin 81 MG EC tablet Take 1 tablet (81 mg total) by mouth daily. 30 tablet 1  . budesonide-formoterol (SYMBICORT) 160-4.5 MCG/ACT inhaler Inhale 2 puffs into the lungs 2 (two) times daily. 1 Inhaler 12  . digoxin (LANOXIN) 0.25 MG tablet Take 1 tablet (0.25 mg total) by mouth daily. 30 tablet 1  . famotidine (PEPCID) 20 MG tablet Take 1 tablet (20 mg total) by mouth daily. 30 tablet 0  . guaiFENesin (MUCINEX) 600 MG 12 hr tablet Take 2 tablets (1,200 mg total) by mouth 2 (two) times daily. For cough 100 tablet 0  . metoprolol tartrate (LOPRESSOR) 25 MG  tablet Take 1 tablet (25 mg total) by mouth 2 (two) times daily. 30 tablet 9  . oxyCODONE (OXY IR/ROXICODONE) 5 MG immediate release tablet Take 1-2 tablets (5-10 mg total) by mouth 2 (two) times daily as needed for severe pain. 45 tablet 0  . sodium polystyrene (KAYEXALATE) 15 GM/60ML suspension Take 60 mLs (15 g total) by mouth every other day. 500 mL 0  . tiotropium (SPIRIVA) 18 MCG inhalation capsule Place 1 capsule (18 mcg total) into inhaler and inhale daily. 30 capsule 12  . warfarin (COUMADIN) 5 MG tablet Take 0.5 tablets (2.5 mg total) by mouth daily at 6 PM. 30 tablet 0  . furosemide (LASIX) 20 MG tablet Take 1 tablet (20 mg total) by mouth daily. 30 tablet 9   No current facility-administered medications for this visit.    Allergies:   Review of patient's allergies indicates no known allergies.    Social History:  The patient   reports that he has quit smoking. He has never used smokeless tobacco. He reports that he does not drink alcohol or use illicit drugs.   Family History:  The patient's family history includes Diabetes in his father; Kidney disease in his brother, father, and sister; Stroke in his father and mother. There is no history of Heart attack.    ROS:  Please see the history of present illness.   Otherwise, review of systems are positive for drainage from PleurX catheter, otherwise denies any shortness of breath or chest pain.   All other systems are reviewed and negative.    PHYSICAL EXAM: VS:  BP 140/77 mmHg  Pulse 67  Ht  (1.753 m)  Wt 140 lb 6.4 oz (63.685 kg)  BMI 20.72 kg/m2  SpO2 97% , BMI Body mass index is 20.72 kg/(m^2). GEN: Well nourished, well developed, in no acute distress HEENT: normal Neck: no JVD, carotid bruits, or masses Cardiac: Irregularly irregular; no murmurs, rubs, or gallops. 3+ pitting edema in bilateral LE Respiratory:  clear to auscultation bilaterally, normal work of breathing GI: soft, nontender, nondistended, + BS MS: no deformity or atrophy Skin: warm and dry, no rash Neuro:  Strength and sensation are intact Psych: euthymic mood, full affect   EKG:  EKG is ordered today. The ekg ordered today demonstrates atrial fibrillation with T-wave inversion in V4 through V6 and also inferior lead.   Recent Labs: 12/27/2015: B Natriuretic Peptide 1306.4* 01/04/2016: TSH 2.395 02/04/2016: ALT 76* 02/09/2016: Hemoglobin 9.9*; Platelets 200 02/10/2016: Magnesium 1.7 02/12/2016: BUN 20; Creatinine, Ser 1.26*; Potassium 5.1; Sodium 136    Lipid Panel    Component Value Date/Time   CHOL 157 12/27/2015 0220   TRIG 48 12/27/2015 0220   HDL 30* 12/27/2015 0220   CHOLHDL 5.2 12/27/2015 0220   VLDL 10 12/27/2015 0220   LDLCALC 117* 12/27/2015 0220      Wt Readings from Last 3 Encounters:  02/18/16 140 lb 6.4 oz (63.685 kg)  02/11/16 141 lb 8.6 oz (64.2 kg)    02/03/16 165 lb 9.1 oz (75.1 kg)      Other studies Reviewed: Additional studies/ records that were reviewed today include:    CABG x4/AVR/Aortic root replacement 01/07/2016 by Dr. Laneta Simmers Procedure:   Median Sternotomy  Extracorporeal circulation 3. Coronary artery bypass graft surgery x 4 4. Endoscopic vein harvest from both legs. 5. Aortic valve replacement using a 25 mm Edwards Magna-ease pericardial valve. 6. Supra-coronary ascending aortic and hemi-arch replacement using a 30 mm Hemashield graft under deep hypothermic  circulatory arrest. 7. Right axillary artery 8 mm Hemashield graft for arterial cannulation and antegrade cerebral perfusion . 8. Insertion of left femoral arterial line.    Cath 12/29/2015 Conclusion     Ost RCA to Prox RCA lesion, 90% stenosed.  Mid RCA lesion, 45% stenosed.  Ost LM lesion, 50% stenosed.  Ost LAD to Mid LAD lesion, 50% stenosed.  Dist LAD lesion, 70% stenosed.  Ost Cx to Prox Cx lesion, 70% stenosed.  1. 3 vessel obstructive CAD  - 50% ostial left main  - diffuse 50% proximal to mid LAD, 70% distal LAD- heavily calcified.  - 70% proximal LCx  -90% proximal RCA 2. Moderate pulmonary HTN with elevated LV filling pressures.  Plan: will need to consider for CABG and AVR. Plavix held.     Echo 01/27/2016 LV EF: 45% - 50%  ------------------------------------------------------------------- Indications: Atrial fibrillation - 427.31. Pericardial effusion 423.9.  ------------------------------------------------------------------- Study Conclusions  - Left ventricle: The cavity size was normal. Systolic function was  mildly reduced. The estimated ejection fraction was in the range  of 45% to 50%. Images were inadequate for LV wall motion  assessment. - Ventricular septum: Septal motion showed paradox. - Left atrium: The atrium was severely dilated. - Right ventricle: The cavity size was  mildly dilated. Systolic  function was reduced. - Right atrium: The atrium was mildly dilated. - Pericardium, extracardiac: A small pericardial effusion was  identified posterior to the heart. There was no evidence of  hemodynamic compromise.   Review of the above records demonstrates:   Patient without prior history of CAD presented with pneumonia and also diagnosed with triple vessel CAD, severe aortic stenosis, and dilated aortic root. He eventually was treated with antibiotics for pneumonia and underwent four-vessel CABG along with aortic root and aortic valve replacement all in the same procedure. It was a high risk procedure as his preop PFTs showed severe COPD.   ASSESSMENT AND PLAN:  1. Acute on chronic combined systolic and diastolic HF: He is clearly fluid overloaded on today's exam, fortunately he does not have pulmonary edema, he has been drinking a lot of fluid from the Pleurx catheter. He has 3+ pitting edema in the lower extremity, I will start 20 mg daily of Lasix, however instructed him to take 40 mg for 3 days then go back to 20 mg daily thereafter.  2. CAD s/p 4v CABG (LIMA to LAD, SVG to OM, seq SVG to PDA/PLA) by Dr. Laneta Simmers 01/07/2016: no angina  3. Dilated ascending aorta s/p supra-coronary ascending aortic and hemi-arch replacement using a 30 mm Hemashield under deep hypothermic circulatory arrest during CABG 01/07/2016  - check CBC next week  4. Severe AS s/p aortic valve replacement using 25 mm Edwards Magna-ease pericardial valve: stable  5. Persistent atrial fibrillation: started during perioperative phase of CABG, previous EKG reviewed, he actually arrive in the hospital in NSR, prior to CABG, he had intermittent afib and was placed on amiodarone. However it seems his afib became persistent after CABG, however it is rate controlled on metoprolol and digoxin. He is anticoagulated with coumadin  -  CHA2DS2-Vasc score 3 (HF, HTN, CAD), continue coumadin.   6. HTN:  BP high 140/77, will increase metoprolol to 25mg  BID for BP control and HR control  7. HLD: For some reason he is no longer on 80 mg Lipitor, discharge summary does show that he had a episode of hypotension, which resulted in shocked liver with elevation of transaminase. I will recheck a lipid  panel next week along with LFT, if they're back to normal, I will restart 80 mg Lipitor.    Current medicines are reviewed at length with the patient today.  The patient does not have concerns regarding medicines.  The following changes have been made:  Increase metoprolol to 25 mg twice a day, started on Lasix 20 mg daily  Labs/ tests ordered today include:   Orders Placed This Encounter  Procedures  . Basic Metabolic Panel (BMET)  . CBC w/Diff  . EKG 12-Lead     Disposition:   FU with me in 2 weeks  Signed, Chanee Henrickson, [place]  02/18/2016 6:24 PM    [facility] [address], [place], [place]  [address] Phone: [phone]) [phone]; Fax: [phone]) [phone]

## 2016-02-18 NOTE — Patient Instructions (Signed)
Medication Instructions:  Your physician has recommended you make the following change in your medication:  1. Increase metoprolol ( 25 mg ) twice a day, sent into patient's requested pharmacy 2.  Start Lasix ( 20 mg ) daily but for the next 3 days starting tonight ( 40 mg ) for 3 days   Labwork: Your physician recommends that you return for lab work on Friday, April 21 between 8-5   Testing/Procedures: -None  Follow-Up: Your physician recommends that you keep your scheduled  follow-up appointment in: with Azalee CourseHao Meng, PA   Any Other Special Instructions Will Be Listed Below (If Applicable).     If you need a refill on your cardiac medications before your next appointment, please call your pharmacy.

## 2016-02-19 ENCOUNTER — Telehealth: Payer: Self-pay | Admitting: Physician Assistant

## 2016-02-19 ENCOUNTER — Telehealth: Payer: Self-pay | Admitting: *Deleted

## 2016-02-19 MED ORDER — POTASSIUM CHLORIDE CRYS ER 20 MEQ PO TBCR: EXTENDED_RELEASE_TABLET | ORAL | Status: DC

## 2016-02-19 NOTE — Addendum Note (Signed)
Addended by: Burnetta SabinWITTY, Brittainy Bucker K on: 02/19/2016 10:23 AM   Modules accepted: Orders

## 2016-02-19 NOTE — Telephone Encounter (Signed)
Per Azalee CourseHao Meng, PA-C, pt is to d/c Kayexalate Start Potassium 40 m eq bid X's 2 days then go back to 20 meq daily Repeat BMET 02/22/16. [facility] and will reche[facility] lab.   Rx's called into [facility].

## 2016-02-19 NOTE — Telephone Encounter (Signed)
New message      Nurse is concerned that pt's potassium level is 2.9 and he is still taking potassium.  Please call

## 2016-02-19 NOTE — Telephone Encounter (Signed)
Returned Craig Fermoina, RN @ [facility].  They done a BMET on pt and potassium is 2.9.  Should pt d/c Kayexalate?    Please advise!

## 2016-02-19 NOTE — Telephone Encounter (Signed)
I am discussed with Victorino DikeJennifer, we are stopping Kayexalate, start 40meq KCl for 2 days, then back to 20meq daily

## 2016-02-19 NOTE — Telephone Encounter (Signed)
Spoke with pt sister, Lupita LeashDonna, [place], per Azalee CourseHao Meng, PA-C 1.  We are handling pt's coumadin 2.  Pt will get Lipid Panel and LFT in 1 week 02/26/16 [facility]

## 2016-02-22 LAB — POCT INR: INR: 3

## 2016-02-23 ENCOUNTER — Ambulatory Visit (INDEPENDENT_AMBULATORY_CARE_PROVIDER_SITE_OTHER): Payer: Self-pay | Admitting: Cardiovascular Disease

## 2016-02-23 DIAGNOSIS — Z5181 Encounter for therapeutic drug level monitoring: Secondary | ICD-10-CM

## 2016-02-23 DIAGNOSIS — I480 Paroxysmal atrial fibrillation: Secondary | ICD-10-CM

## 2016-02-25 ENCOUNTER — Encounter: Payer: Self-pay | Admitting: Surgery

## 2016-02-25 ENCOUNTER — Encounter: Payer: Self-pay | Admitting: Physical Medicine & Rehabilitation

## 2016-02-25 ENCOUNTER — Ambulatory Visit (INDEPENDENT_AMBULATORY_CARE_PROVIDER_SITE_OTHER): Payer: Self-pay | Admitting: Surgery

## 2016-02-25 ENCOUNTER — Encounter: Payer: Medicaid Other | Attending: Physical Medicine & Rehabilitation | Admitting: Physical Medicine & Rehabilitation

## 2016-02-25 ENCOUNTER — Ambulatory Visit
Admission: RE | Admit: 2016-02-25 | Discharge: 2016-02-25 | Disposition: A | Discharge status: Home / Self Care | Payer: No Typology Code available for payment source | Source: Ambulatory Visit | Attending: Surgery | Admitting: Surgery

## 2016-02-25 VITALS — BP 154/88 | HR 54 | Resp 16 | Ht 69.0 in | Wt 134.6 lb

## 2016-02-25 DIAGNOSIS — E785 Hyperlipidemia, unspecified: Secondary | ICD-10-CM | POA: Diagnosis not present

## 2016-02-25 DIAGNOSIS — R5381 Other malaise: Secondary | ICD-10-CM

## 2016-02-25 DIAGNOSIS — R748 Abnormal levels of other serum enzymes: Secondary | ICD-10-CM | POA: Diagnosis not present

## 2016-02-25 DIAGNOSIS — I214 Non-ST elevation (NSTEMI) myocardial infarction: Secondary | ICD-10-CM | POA: Diagnosis not present

## 2016-02-25 DIAGNOSIS — I4891 Unspecified atrial fibrillation: Secondary | ICD-10-CM | POA: Insufficient documentation

## 2016-02-25 DIAGNOSIS — Z952 Presence of prosthetic heart valve: Secondary | ICD-10-CM

## 2016-02-25 DIAGNOSIS — Z5181 Encounter for therapeutic drug level monitoring: Secondary | ICD-10-CM

## 2016-02-25 DIAGNOSIS — I481 Persistent atrial fibrillation: Secondary | ICD-10-CM | POA: Insufficient documentation

## 2016-02-25 DIAGNOSIS — E875 Hyperkalemia: Secondary | ICD-10-CM

## 2016-02-25 DIAGNOSIS — R600 Localized edema: Secondary | ICD-10-CM | POA: Insufficient documentation

## 2016-02-25 DIAGNOSIS — J449 Chronic obstructive pulmonary disease, unspecified: Secondary | ICD-10-CM | POA: Insufficient documentation

## 2016-02-25 DIAGNOSIS — R531 Weakness: Secondary | ICD-10-CM | POA: Insufficient documentation

## 2016-02-25 DIAGNOSIS — I350 Nonrheumatic aortic (valve) stenosis: Secondary | ICD-10-CM

## 2016-02-25 DIAGNOSIS — I2510 Atherosclerotic heart disease of native coronary artery without angina pectoris: Secondary | ICD-10-CM | POA: Diagnosis not present

## 2016-02-25 DIAGNOSIS — Z951 Presence of aortocoronary bypass graft: Secondary | ICD-10-CM | POA: Insufficient documentation

## 2016-02-25 DIAGNOSIS — Z9889 Other specified postprocedural states: Secondary | ICD-10-CM | POA: Insufficient documentation

## 2016-02-25 DIAGNOSIS — J90 Pleural effusion, not elsewhere classified: Secondary | ICD-10-CM | POA: Diagnosis not present

## 2016-02-25 DIAGNOSIS — Z87891 Personal history of nicotine dependence: Secondary | ICD-10-CM | POA: Diagnosis not present

## 2016-02-25 DIAGNOSIS — Z954 Presence of other heart-valve replacement: Secondary | ICD-10-CM

## 2016-02-25 DIAGNOSIS — E876 Hypokalemia: Secondary | ICD-10-CM

## 2016-02-25 DIAGNOSIS — I480 Paroxysmal atrial fibrillation: Secondary | ICD-10-CM

## 2016-02-25 DIAGNOSIS — F1010 Alcohol abuse, uncomplicated: Secondary | ICD-10-CM | POA: Insufficient documentation

## 2016-02-25 DIAGNOSIS — Z09 Encounter for follow-up examination after completed treatment for conditions other than malignant neoplasm: Secondary | ICD-10-CM | POA: Insufficient documentation

## 2016-02-25 DIAGNOSIS — R438 Other disturbances of smell and taste: Secondary | ICD-10-CM | POA: Insufficient documentation

## 2016-02-25 DIAGNOSIS — R42 Dizziness and giddiness: Secondary | ICD-10-CM | POA: Diagnosis not present

## 2016-02-25 DIAGNOSIS — I10 Essential (primary) hypertension: Secondary | ICD-10-CM | POA: Insufficient documentation

## 2016-02-25 DIAGNOSIS — R609 Edema, unspecified: Secondary | ICD-10-CM

## 2016-02-25 DIAGNOSIS — I712 Thoracic aortic aneurysm, without rupture, unspecified: Secondary | ICD-10-CM

## 2016-02-25 DIAGNOSIS — Z79899 Other long term (current) drug therapy: Secondary | ICD-10-CM

## 2016-02-25 DIAGNOSIS — Z8679 Personal history of other diseases of the circulatory system: Secondary | ICD-10-CM

## 2016-02-25 NOTE — Progress Notes (Signed)
Subjective:    Patient ID: Craig [name], male    DOB: 25-Mar-1951, 65 y.o.   MRN: [id number]  HPI  65 year old male with history of HTN, ETOH abuse, COPD who was admitted on 12/26/15 with CAP and elevated cardiac enzymes presents for transitional care management after being discharged from [facility] for debility secondary to sepsis and NSTEMI with subsequent CABG/ascending aneurysm repair.   Admit date: 02/03/2016 Discharge date: 02/12/2016 At discharge, he was encouraged to maintain to sternal precautions, which he has been doing.  He is seeing Dr. Laneta Simmers for his pleural tubes.  He continues to have fluid drainage.  His potassium is up/down. Dr. Elsie Ra is managing his potassium.  His INR is being followed by coumadin clinic.  His swelling is decreasing.   He was encouraged to follow up with PCP, which he did.    Therapies: No therapies, does HH exercises. Mobility: Rolator inside/outside house DME: Toilet seat   Pain Inventory Average Pain 0 Pain Right Now 0 My pain is with drainage of pleural tube  In the last 24 hours, has pain interfered with the following? General activity 4 Relation with others 4 Enjoyment of life 3 What TIME of day is your pain at its worst? not answered Sleep (in general) Fair  Pain is worse with: some activites Pain improves with: other Relief from Meds: not answered  Mobility walk with assistance use a walker how many minutes can you walk? 5 ability to climb steps?  yes do you drive?  no  Function retired I need assistance with the following:  household duties and shopping  Neuro/Psych weakness dizziness loss of taste or smell  Prior Studies Any changes since last visit?  no  Physicians involved in your care Any changes since last visit?  no   Family History  Problem Relation Age of Onset  . Diabetes Father   . Heart attack Neg Hx   . Stroke Mother   . Stroke Father   . Kidney disease Father   . Kidney disease Sister   . Kidney  disease Brother    Social History   Social History  . Marital Status: Single    Spouse Name: N/A  . Number of Children: 3  . Years of Education: GED   Occupational History  . Retired heavy Arboriculturist    Social History Main Topics  . Smoking status: Former Games developer  . Smokeless tobacco: Never Used     Comment: quit 2010 - up to 3 ppd for 50 years  . Alcohol Use: No     Comment: quit 2004 - 12 pack per day for 30 years  . Drug Use: No     Comment: quit 2015  . Sexual Activity: Not Asked   Other Topics Concern  . None   Social History Narrative   Lives with son   Drinks one cup of coffee a day and one soda q2-3 days   Past Surgical History  Procedure Laterality Date  . Ruptured disc repair  2009 or 2010  . Cardiac catheterization N/A 12/29/2015    Procedure: Right/Left Heart Cath and Coronary Angiography;  Surgeon: Peter M Swaziland, MD;  Location: [facility] INVASIVE CV LAB;  Service: Cardiovascular;  Laterality: N/A;  . Coronary artery bypass graft N/A 01/07/2016    Procedure: CORONARY ARTERY BYPASS GRAFTING (CABG), ON PUMP, TIMES FOUR, USING LEFT INTERNAL MAMMARY ARTERY, BILATERAL GREATER SAPHENOUS VEINS HARVESTED ENDOSCOPICALLY;  Surgeon: Alleen Borne, MD;  Location: [facility] OR;  Service: Open Heart Surgery;  Laterality: N/A;  LIMA to LAD, SVG to OM, SVG SEQUENTIALLY to PDA and PLB  . Tee without cardioversion N/A 01/07/2016    Procedure: TRANSESOPHAGEAL ECHOCARDIOGRAM (TEE);  Surgeon: Alleen BorneBryan K Bartle, MD;  [facility];  Service: Open Heart Surgery;  Laterality: N/A;  . Thoracic aortic aneurysm repair N/A 01/07/2016    Procedure: CIRC ARREST AND REPLACEMENT OF ASCENDING THORACIC  ANEURYSM;  Surgeon: Alleen BorneBryan K Bartle, MD;  Location: [facility] OR;  Service: Open Heart Surgery;  Laterality: N/A;  . Chest tube insertion Bilateral 01/25/2016    Procedure: INSERTION Bilateral PLEURAL DRAINAGE CATHETER;  Surgeon: Alleen BorneBryan K Bartle, MD;  Location: [facility] OR;  Service: Thoracic;  Laterality: Bilateral;   Past  Medical History  Diagnosis Date  . COPD (chronic obstructive pulmonary disease) (HCC)     severe on PFT prior to CABG  . Hypertension   . Hyperlipidemia   . Alcohol abuse     Prior  . Tobacco abuse     Prior  . CAD (coronary artery disease) of bypass graft      4v CABG (LIMA to LAD, SVG to OM, seq SVG to PDA/PLA) by Dr. Laneta SimmersBartle 01/07/2016  . dilated ascending aorta     s/p supra-coronary ascending aortic and hemi-arch replacement using a 30 mm Hemashield under deep hypothermic circulatory arrest during CABG 01/07/2016  . Severe aortic stenosis     s/p aortic valve replacement using 25 mm Edwards Magna-ease pericardial valve by Dr. Laneta SimmersBartle during CABG 01/07/2016  . Persistent atrial fibrillation (HCC)     in the setting of [facility]STEMI and [facility]  . Recurrent left pleural effusion     s/p PleurX drain, occured after CABG  . Recurrent right pleural effusion     s/p PleurX drain, occured after CABG   There were no vitals taken for this visit.  Opioid Risk Score:   Fall Risk Score:  `1  Depression screen PHQ 2/9  Depression screen PHQ 2/9 02/25/2016  Decreased Interest 0  Down, Depressed, Hopeless 0  PHQ - 2 Score 0  Altered sleeping 3  Tired, decreased energy 3  Change in appetite 3  Feeling bad or failure about yourself  0  Trouble concentrating 0  Moving slowly or fidgety/restless 3  Suicidal thoughts 0  PHQ-9 Score 12  Difficult doing work/chores Somewhat difficult   Review of Systems  Respiratory: Positive for apnea and shortness of breath.   Cardiovascular: Positive for leg swelling.  All other systems reviewed and are negative.     Objective:   Physical Exam Constitutional: He appears well-developed and well-nourished. Nasal cannula in place. NAD HENT:  Normocephalic and atraumatic.   Eyes: Conjunctivae and EOM are normal.   Cardiovascular: An irregularly irregular rhythm present.     Respiratory: No respiratory distress. Unlabored breathing. Clear to  auscultation. He exhibits no tenderness.   GI: Soft. Bowel sounds are normal. He exhibits no distension. There is no tenderness.  Musculoskeletal: He exhibits edema, 2+ B/l LE (slightly improved).     Neurological: He is alert and oriented.  Speech clear.   Motor:   Right upper extremity 5/5 proximal distal Left upper extremity 5/5 proximally, 5/5 distally Right lower extremity 5/5 proximal distal Left lower extremity: 5/5 hip flexion, knee extension, 5/5 ankle dorsi/plantarflexion Skin: Skin is warm and dry.  Midline chest incision clean, dry and intact. Torso with dry dressing covering tubing bilaterally.  Psychiatric: His speech is normal. Cognition and memory are  not impaired. Behavior and affect are normal.    Assessment & Plan:  65 year old male with history of HTN, ETOH abuse, COPD who was admitted on 12/26/15 with CAP and elevated cardiac enzymes presents for transitional care management after being discharged from [facility] for debility secondary to sepsis and NSTEMI with subsequent CABG/ascending aneurysm repair.    1. S/p sepsis and NSTEMI with subsequent CABG/ascending aneurysm repair  Cont to follow up with Cardiology   Cont meds  Cont supplemental O2  2. Labile potassium  Cont to follow with Cardiology regarding adjustment to K+ supplementation and lasix  3. Afib  Cont to follow up with coumadin clinic for INR and medication adjustment  4. Peripheral edema             Elevate legs             Cont Lasix    Educated pt on ankle socks and compression, in addition to leg elevation  5. COPD  Cont supplemental O2  6. Pleural effusion  Pt to follow up with Dr. Laneta Simmers today regarding tubes    Meds reviewed Referrals reviewed. All questions answered.

## 2016-02-26 ENCOUNTER — Other Ambulatory Visit (INDEPENDENT_AMBULATORY_CARE_PROVIDER_SITE_OTHER): Payer: Self-pay | Admitting: *Deleted

## 2016-02-26 ENCOUNTER — Encounter: Payer: Self-pay | Admitting: Surgery

## 2016-02-26 DIAGNOSIS — I480 Paroxysmal atrial fibrillation: Secondary | ICD-10-CM

## 2016-02-26 DIAGNOSIS — I25810 Atherosclerosis of coronary artery bypass graft(s) without angina pectoris: Secondary | ICD-10-CM

## 2016-02-26 DIAGNOSIS — I5043 Acute on chronic combined systolic (congestive) and diastolic (congestive) heart failure: Secondary | ICD-10-CM

## 2016-02-26 DIAGNOSIS — Z952 Presence of prosthetic heart valve: Secondary | ICD-10-CM

## 2016-02-26 LAB — CBC WITH DIFFERENTIAL/PLATELET
BASOS ABS: 225 {cells}/uL — AB (ref 0–200)
Basophils Relative: 3 %
EOS ABS: 225 {cells}/uL (ref 15–500)
Eosinophils Relative: 3 %
HEMATOCRIT: 37.7 % — AB (ref 38.5–50.0)
Hemoglobin: 12 g/dL — ABNORMAL LOW (ref 13.2–17.1)
LYMPHS PCT: 21 %
Lymphs Abs: 1575 cells/uL (ref 850–3900)
MCH: 30.5 pg (ref 27.0–33.0)
MCHC: 31.8 g/dL — AB (ref 32.0–36.0)
MCV: 95.7 fL (ref 80.0–100.0)
MONO ABS: 750 {cells}/uL (ref 200–950)
MONOS PCT: 10 %
MPV: 10.5 fL (ref 7.5–12.5)
NEUTROS PCT: 63 %
Neutro Abs: 4725 cells/uL (ref 1500–7800)
PLATELETS: 229 10*3/uL (ref 140–400)
RBC: 3.94 MIL/uL — ABNORMAL LOW (ref 4.20–5.80)
RDW: 17.7 % — AB (ref 11.0–15.0)
WBC: 7.5 10*3/uL (ref 3.8–10.8)

## 2016-02-26 LAB — HEPATIC FUNCTION PANEL
ALBUMIN: 2.8 g/dL — AB (ref 3.6–5.1)
ALK PHOS: 90 U/L (ref 40–115)
ALT: 18 U/L (ref 9–46)
AST: 23 U/L (ref 10–35)
BILIRUBIN DIRECT: 0.2 mg/dL (ref <=0.2)
BILIRUBIN TOTAL: 0.6 mg/dL (ref 0.2–1.2)
Indirect Bilirubin: 0.4 mg/dL (ref 0.2–1.2)
Total Protein: 5.9 g/dL — ABNORMAL LOW (ref 6.1–8.1)

## 2016-02-26 LAB — LIPID PANEL
Cholesterol: 168 mg/dL (ref 125–200)
HDL: 39 mg/dL — AB (ref >=40)
LDL CALC: 106 mg/dL (ref <130)
Total CHOL/HDL Ratio: 4.3 Ratio (ref <=5.0)
Triglycerides: 114 mg/dL (ref <150)
VLDL: 23 mg/dL (ref <30)

## 2016-02-26 LAB — BASIC METABOLIC PANEL
BUN: 16 mg/dL (ref 7–25)
CHLORIDE: 98 mmol/L (ref 98–110)
CO2: 31 mmol/L (ref 20–31)
CREATININE: 1.1 mg/dL (ref 0.70–1.25)
Calcium: 8.4 mg/dL — ABNORMAL LOW (ref 8.6–10.3)
GLUCOSE: 100 mg/dL — AB (ref 65–99)
POTASSIUM: 4.8 mmol/L (ref 3.5–5.3)
Sodium: 136 mmol/L (ref 135–146)

## 2016-02-26 NOTE — Progress Notes (Signed)
HPI: Patient returns for routine postoperative follow-up having undergone CABG x 4, AVR with a pericardial valve and supra-coronary ascending aortic and hemi-arch replacement on 01/07/2016. The patient's early postoperative recovery while in the hospital was notable for a very complicated course due to his severe COPD and marginal state at the time of surgery. He was admitted in acute respiratory failure with pulmonary edema and was intubated emergently but extubated himself a few hrs later. His surgery went well and he did well for a couple days then developed sepsis of undetermined etiology with hypotension requiring high dose vasopressors, shock liver, oliguric renal failure and severe metabolic acidosis. He looked like he might die but on IV vancomycin and Zosyn he turned around within 24 hrs and all systems improved. He never had positive blood cultures and the only positive culture was yeast in his sputum. He was making a good recovery and then had another episode that was similar but not as severe a couple weeks later and responded again to antibiotics. He developed recurrent bilateral pleural effusions that caused significant respiratory compromise due to his severe COPD and he had thoracentesis x 2 before insertion of bilateral pleurX catheters. He recovered and went to a SNF for rehab. He is now back at home with [place]. Since SNF discharge the patient reports that he is making slow progress. He still gets some shortness of breath early in the morning but after draining the catheters daily his breathing improves. Each one is still putting out about 300 cc/day but it has been slowly decreasing. He has been eating well and ambulating. He denies fevers or chills.   Current Outpatient Prescriptions  Medication Sig Dispense Refill  . aspirin 81 MG EC tablet Take 1 tablet (81 mg total) by mouth daily. 30 tablet 1  . budesonide-formoterol (SYMBICORT) 160-4.5 MCG/ACT inhaler Inhale  2 puffs into the lungs 2 (two) times daily. 1 Inhaler 12  . digoxin (LANOXIN) 0.25 MG tablet Take 1 tablet (0.25 mg total) by mouth daily. 30 tablet 1  . famotidine (PEPCID) 20 MG tablet Take 1 tablet (20 mg total) by mouth daily. 30 tablet 0  . furosemide (LASIX) 20 MG tablet Take 1 tablet (20 mg total) by mouth daily. 30 tablet 9  . guaiFENesin (MUCINEX) 600 MG 12 hr tablet Take 2 tablets (1,200 mg total) by mouth 2 (two) times daily. For cough 100 tablet 0  . metoprolol tartrate (LOPRESSOR) 25 MG tablet Take 1 tablet (25 mg total) by mouth 2 (two) times daily. 30 tablet 9  . oxyCODONE (OXY IR/ROXICODONE) 5 MG immediate release tablet Take 1-2 tablets (5-10 mg total) by mouth 2 (two) times daily as needed for severe pain. 45 tablet 0  . potassium chloride SA (K-DUR,KLOR-CON) 20 MEQ tablet Take 2 tablets by mouth twice a day X's 3 days then go to taking 1 tablet daily 90 tablet 3  . tiotropium (SPIRIVA) 18 MCG inhalation capsule Place 1 capsule (18 mcg total) into inhaler and inhale daily. 30 capsule 12  . warfarin (COUMADIN) 5 MG tablet Take 0.5 tablets (2.5 mg total) by mouth daily at 6 PM. 30 tablet 0   No current facility-administered medications for this visit.    Physical Exam: BP 154/88 mmHg  Pulse 54  Resp 16  Ht 5\' 9"  (1.753 m)  Wt 134 lb 9.6 oz (61.054 kg)  BMI 19.87 kg/m2  SpO2 98% He looks well but weak Lung exam is clear. Cardiac exam shows  an irregular rate and rhythm with normal heart sounds. Chest incision is healing well and sternum is stable. The leg incisions are healing well and there is moderate bilateral lower extremity  peripheral edema to the knee level.   Diagnostic Tests:  CLINICAL DATA: Status post coronary artery bypass grafting. Shortness of breath.  EXAM: CHEST 2 VIEW  COMPARISON: February 10, 2016 and February 01, 2016  FINDINGS: There is underlying hyperexpansion with evidence suggesting emphysematous change. There are small pleural effusions  bilaterally. There is atelectatic change in the right base. There is no frank airspace consolidation.  The patient is status post aortic valve replacement and coronary artery bypass grafting. Chest drains are present bilaterally, stable. No pneumothorax. No adenopathy. Old rib fractures bilaterally appear stable.  IMPRESSION: Persistent small pleural effusions bilaterally. Right base atelectasis. Underlying emphysematous change. Stable cardiac silhouette. No pneumothorax.   Electronically Signed  By: Bretta Bang III M.D.  On: 02/25/2016 15:35   Impression:  Overall I think he is making good progress considering his preop state with severe COPD and his complicated postop course. He was at bedrest for a week preop and then for several weeks after surgery when he decompensated. His pleural effusions seem to be improving with time and hopefully the drainage will stop as his volume excess resolves and his albumin level rises with continued attention to nutrition. I discussed the importance of fluid and sodium restriction in managing his edema.  Plan:  He will continue daily drainage of the PleurX catheters and I will see him back in 3 weeks with a CXR. He will let us know immediately if the catheters stop draining before that. He has a follow up appt with cardiology in the near future.   Alleen Borne, MD [facility] [phone]

## 2016-02-27 ENCOUNTER — Emergency Department (HOSPITAL_COMMUNITY)
Admission: EM | Admit: 2016-02-27 | Discharge: 2016-02-27 | Disposition: A | Discharge status: Home / Self Care | Payer: Medicaid Other | Attending: Emergency Medicine | Admitting: Emergency Medicine

## 2016-02-27 ENCOUNTER — Encounter (HOSPITAL_COMMUNITY): Payer: Self-pay | Admitting: Emergency Medicine

## 2016-02-27 DIAGNOSIS — J449 Chronic obstructive pulmonary disease, unspecified: Secondary | ICD-10-CM | POA: Insufficient documentation

## 2016-02-27 DIAGNOSIS — I10 Essential (primary) hypertension: Secondary | ICD-10-CM | POA: Diagnosis not present

## 2016-02-27 DIAGNOSIS — Z87891 Personal history of nicotine dependence: Secondary | ICD-10-CM | POA: Insufficient documentation

## 2016-02-27 DIAGNOSIS — Z79891 Long term (current) use of opiate analgesic: Secondary | ICD-10-CM | POA: Diagnosis not present

## 2016-02-27 DIAGNOSIS — I2510 Atherosclerotic heart disease of native coronary artery without angina pectoris: Secondary | ICD-10-CM | POA: Diagnosis not present

## 2016-02-27 DIAGNOSIS — E785 Hyperlipidemia, unspecified: Secondary | ICD-10-CM | POA: Insufficient documentation

## 2016-02-27 DIAGNOSIS — Z7982 Long term (current) use of aspirin: Secondary | ICD-10-CM | POA: Diagnosis not present

## 2016-02-27 DIAGNOSIS — Z79899 Other long term (current) drug therapy: Secondary | ICD-10-CM | POA: Diagnosis not present

## 2016-02-27 DIAGNOSIS — R040 Epistaxis: Secondary | ICD-10-CM | POA: Insufficient documentation

## 2016-02-27 DIAGNOSIS — R7989 Other specified abnormal findings of blood chemistry: Secondary | ICD-10-CM | POA: Insufficient documentation

## 2016-02-27 DIAGNOSIS — I481 Persistent atrial fibrillation: Secondary | ICD-10-CM | POA: Diagnosis not present

## 2016-02-27 DIAGNOSIS — R791 Abnormal coagulation profile: Secondary | ICD-10-CM

## 2016-02-27 LAB — BASIC METABOLIC PANEL
Anion gap: 11 (ref 5–15)
BUN: 18 mg/dL (ref 6–20)
CHLORIDE: 98 mmol/L — AB (ref 101–111)
CO2: 26 mmol/L (ref 22–32)
CREATININE: 1.18 mg/dL (ref 0.61–1.24)
Calcium: 8.6 mg/dL — ABNORMAL LOW (ref 8.9–10.3)
GFR calc Af Amer: >60 mL/min (ref >60)
GFR calc non Af Amer: >60 mL/min (ref >60)
GLUCOSE: 105 mg/dL — AB (ref 65–99)
Potassium: 5.3 mmol/L — ABNORMAL HIGH (ref 3.5–5.1)
Sodium: 135 mmol/L (ref 135–145)

## 2016-02-27 LAB — CBC WITH DIFFERENTIAL/PLATELET
BASOS ABS: 0.1 10*3/uL (ref 0.0–0.1)
Basophils Relative: 1 %
Eosinophils Absolute: 0.2 10*3/uL (ref 0.0–0.7)
Eosinophils Relative: 2 %
HCT: 37.8 % — ABNORMAL LOW (ref 39.0–52.0)
Hemoglobin: 12.5 g/dL — ABNORMAL LOW (ref 13.0–17.0)
LYMPHS ABS: 2 10*3/uL (ref 0.7–4.0)
LYMPHS PCT: 20 %
MCH: 31.2 pg (ref 26.0–34.0)
MCHC: 33.1 g/dL (ref 30.0–36.0)
MCV: 94.3 fL (ref 78.0–100.0)
MONO ABS: 0.9 10*3/uL (ref 0.1–1.0)
Monocytes Relative: 8 %
NEUTROS ABS: 7 10*3/uL (ref 1.7–7.7)
Neutrophils Relative %: 69 %
Platelets: 229 10*3/uL (ref 150–400)
RBC: 4.01 MIL/uL — ABNORMAL LOW (ref 4.22–5.81)
RDW: 17.7 % — AB (ref 11.5–15.5)
WBC: 10.2 10*3/uL (ref 4.0–10.5)

## 2016-02-27 LAB — PROTIME-INR
INR: 4.75 — ABNORMAL HIGH (ref 0.00–1.49)
Prothrombin Time: 43.2 seconds — ABNORMAL HIGH (ref 11.6–15.2)

## 2016-02-27 MED ORDER — SILVER NITRATE-POT NITRATE 75-25 % EX MISC
CUTANEOUS | Status: AC
  Filled 2016-02-27: qty 2

## 2016-02-27 MED ORDER — ONDANSETRON HCL 4 MG/2ML IJ SOLN
4.0000 mg | Freq: Once | INTRAMUSCULAR | Status: AC
  Administered 2016-02-27: 4 mg via INTRAVENOUS
  Filled 2016-02-27: qty 2

## 2016-02-27 MED ORDER — SODIUM CHLORIDE 0.9 % IV BOLUS (SEPSIS)
500.0000 mL | Freq: Once | INTRAVENOUS | Status: AC
  Administered 2016-02-27: 500 mL via INTRAVENOUS

## 2016-02-27 MED ORDER — PANTOPRAZOLE SODIUM 40 MG IV SOLR
40.0000 mg | Freq: Once | INTRAVENOUS | Status: AC
  Administered 2016-02-27: 40 mg via INTRAVENOUS
  Filled 2016-02-27: qty 40

## 2016-02-27 MED ORDER — HYDROGEN PEROXIDE 3 % EX SOLN
CUTANEOUS | Status: AC
  Filled 2016-02-27: qty 473

## 2016-02-27 MED ORDER — OXYMETAZOLINE HCL 0.05 % NA SOLN
1.0000 | Freq: Once | NASAL | Status: AC
  Administered 2016-02-27: 1 via NASAL
  Filled 2016-02-27: qty 15

## 2016-02-27 NOTE — ED Notes (Signed)
Rhino packing placed by EDP.

## 2016-02-27 NOTE — ED Notes (Signed)
No improvement with afrin nasal spray. EDP aware. Ice pack applied.

## 2016-02-27 NOTE — ED Notes (Addendum)
Family requesting kit for PleuX drains. Per family patient has bilateral PleuraX drains. EDP aware. AC called.

## 2016-02-27 NOTE — Discharge Instructions (Signed)
Follow-up with ENT on Tuesday or Wednesday. Call Monday morning for an appointment. Phone number given. Rest. Do not aggravate your nose. Hold Coumadin Saturday and Sunday. You can take a baby aspirin tomorrow.

## 2016-02-27 NOTE — ED Notes (Signed)
Sister states pt just had heart surgery and is currently on Warfarin.  Began having a nosebleed about 1.5hrs ago.

## 2016-02-27 NOTE — ED Provider Notes (Signed)
CSN: [id number]     Arrival date & time 02/27/16  1551 History   First MD Initiated Contact with Patient 02/27/16 1607     Chief Complaint  Patient presents with  . Epistaxis     (Consider location/radiation/quality/duration/timing/severity/associated sxs/prior Treatment) HPI.Marland KitchenMarland KitchenMarland KitchenLevel V caveat for urgent need for intervention. Acute onset of aggressive nosebleed approximately 2 hours ago. Status post recent aortic bowel replacement and CABG. He is on Coumadin. No prodromal illnesses.  Past Medical History  Diagnosis Date  . COPD (chronic obstructive pulmonary disease) (HCC)     severe on PFT prior to CABG  . Hypertension   . Hyperlipidemia   . Alcohol abuse     Prior  . Tobacco abuse     Prior  . CAD (coronary artery disease) of bypass graft      4v CABG (LIMA to LAD, SVG to OM, seq SVG to PDA/PLA) by Dr. Laneta Simmers 01/07/2016  . dilated ascending aorta     s/p supra-coronary ascending aortic and hemi-arch replacement using a 30 mm Hemashield under deep hypothermic circulatory arrest during CABG 01/07/2016  . Severe aortic stenosis     s/p aortic valve replacement using 25 mm Edwards Magna-ease pericardial valve by Dr. Laneta Simmers during CABG 01/07/2016  . Persistent atrial fibrillation (HCC)     in the setting of NSTEMI and persisted after [facility] 01/2016  . Recurrent left pleural effusion     s/p PleurX drain, occured after CABG  . Recurrent right pleural effusion     s/p PleurX drain, occured after CABG   Past Surgical History  Procedure Laterality Date  . Ruptured disc repair  2009 or 2010  . Cardiac catheterization N/A 12/29/2015    Procedure: Right/Left Heart Cath and Coronary Angiography;  Surgeon: Peter M Swaziland, MD;  Location[facility] INVASIVE CV LAB;  Service: Cardiovascular;  Laterality: N/A;  . Coronary artery bypass graft N/A 01/07/2016    Procedure: CORONARY ARTERY BYPASS GRAFTING (CABG), ON PUMP, TIMES FOUR, USING LEFT INTERNAL MAMMARY ARTERY, BILATERAL GREATER SAPHENOUS VEINS HARVESTED  ENDOSCOPICALLY;  Surgeon: Alleen Borne, MD;  Location: [facility] OR;  Service: Open Heart Surgery;  Laterality: N/A;  LIMA to LAD, SVG to OM, SVG SEQUENTIALLY to PDA and PLB  . Tee without cardioversion N/A 01/07/2016    Procedure: TRANSESOPHAGEAL ECHOCARDIOGRAM (TEE);  Surgeon: Alleen Borne, MD;  Location: [facility] OR;  Service: Open Heart Surgery;  Laterality: N/A;  . Thoracic aortic aneurysm repair N/A 01/07/2016    Procedure: CIRC ARREST AND REPLACEMENT OF ASCENDING THORACIC  ANEURYSM;  Surgeon: Alleen Borne, MD;  Location: [facility] OR;  Service: Open Heart Surgery;  Laterality: N/A;  . Chest tube insertion Bilateral 01/25/2016    Procedure: INSERTION Bilateral PLEURAL DRAINAGE CATHETER;  Surgeon: Alleen Borne, MD;  Location: [facility] OR;  Service: Thoracic;  Laterality: Bilateral;   Family History  Problem Relation Age of Onset  . Diabetes Father   . Heart attack Neg Hx   . Stroke Mother   . Stroke Father   . Kidney disease Father   . Kidney disease Sister   . Kidney disease Brother    Social History  Substance Use Topics  . Smoking status: Former Games developer  . Smokeless tobacco: Never Used     Comment: quit 2010 - up to 3 ppd for 50 years  . Alcohol Use: No     Comment: quit 2004 - 12 pack per day for 30 years    Review of Systems  Reason unable to perform  ROS: Urgent need for intervention.      Allergies  Review of patient's allergies indicates no known allergies.  Home Medications   Prior to Admission medications   Medication Sig Start Date End Date Taking? Authorizing Provider  aspirin 81 MG EC tablet Take 1 tablet (81 mg total) by mouth daily. 02/12/16  Yes Evlyn Kanner Love, PA-C  budesonide-formoterol (SYMBICORT) 160-4.5 MCG/ACT inhaler Inhale 2 puffs into the lungs 2 (two) times daily. 02/12/16  Yes Evlyn Kanner Love, PA-C  digoxin (LANOXIN) 0.25 MG tablet Take 1 tablet (0.25 mg total) by mouth daily. 02/12/16  Yes Evlyn Kanner Love, PA-C  famotidine (PEPCID) 20 MG tablet Take 1 tablet (20 mg total) by  mouth daily. 02/12/16  Yes Evlyn Kanner Love, PA-C  furosemide (LASIX) 20 MG tablet Take 1 tablet (20 mg total) by mouth daily. 02/18/16  Yes Azalee Course, PA  guaiFENesin (MUCINEX) 600 MG 12 hr tablet Take 2 tablets (1,200 mg total) by mouth 2 (two) times daily. For cough 02/12/16  Yes Evlyn Kanner Love, PA-C  metoprolol tartrate (LOPRESSOR) 25 MG tablet Take 1 tablet (25 mg total) by mouth 2 (two) times daily. 02/18/16  Yes Azalee Course, PA  oxyCODONE (OXY IR/ROXICODONE) 5 MG immediate release tablet Take 1-2 tablets (5-10 mg total) by mouth 2 (two) times daily as needed for severe pain. 02/12/16  Yes Evlyn Kanner Love, PA-C  potassium chloride SA (K-DUR,KLOR-CON) 20 MEQ tablet Take 2 tablets by mouth twice a day X's 3 days then go to taking 1 tablet daily Patient taking differently: Take 20 mEq by mouth daily. Take 2 tablets by mouth twice a day X's 3 days then go to taking 1 tablet daily 02/19/16  Yes Azalee Course, PA  tiotropium (SPIRIVA) 18 MCG inhalation capsule Place 1 capsule (18 mcg total) into inhaler and inhale daily. 02/12/16  Yes Evlyn Kanner Love, PA-C  warfarin (COUMADIN) 5 MG tablet Take 0.5 tablets (2.5 mg total) by mouth daily at 6 PM. 02/12/16  Yes Evlyn Kanner Love, PA-C   BP 129/74 mmHg  Pulse 56  Temp(Src) 97 F (36.1 C) (Temporal)  Resp 14  SpO2 95% Physical Exam  Constitutional: He is oriented to person, place, and time. He appears well-developed and well-nourished.  HENT:  Head: Normocephalic and atraumatic.  Copious bleeding from left naris  Eyes: Conjunctivae and EOM are normal. Pupils are equal, round, and reactive to light.  Neck: Normal range of motion. Neck supple.  Cardiovascular: Normal rate and regular rhythm.   Pulmonary/Chest: Effort normal and breath sounds normal.  Abdominal: Soft. Bowel sounds are normal.  Musculoskeletal: Normal range of motion.  Neurological: He is alert and oriented to person, place, and time.  Skin: Skin is warm and dry.  Psychiatric: He has a normal mood and affect. His  behavior is normal.  Nursing note and vitals reviewed.   ED Course  .Epistaxis Management Date/Time: 02/27/2016 4:10 PM Performed by: Donnetta Hutching Authorized by: Donnetta Hutching Consent: Verbal consent obtained. Risks and benefits: risks, benefits and alternatives were discussed Consent given by: patient Patient understanding: patient states understanding of the procedure being performed Comments: 1610:  Left nares examined with headlight, nasal speculum, suction. Copious bleeding noted.  It was impossible for me to determine if I was dealing with an anterior or posterior bleed. A 7.5 cm Rhino Rocket was inserted without difficulty. Patient tolerated procedure well.   (including critical care time) Labs Review Labs Reviewed  CBC WITH DIFFERENTIAL/PLATELET - Abnormal; Notable for the following:  RBC 4.01 (*)    Hemoglobin 12.5 (*)    HCT 37.8 (*)    RDW 17.7 (*)    All other components within normal limits  BASIC METABOLIC PANEL - Abnormal; Notable for the following:    Potassium 5.3 (*)    Chloride 98 (*)    Glucose, Bld 105 (*)    Calcium 8.6 (*)    All other components within normal limits  PROTIME-INR - Abnormal; Notable for the following:    Prothrombin Time 43.2 (*)    INR 4.75 (*)    All other components within normal limits    Imaging Review No results found. I have personally reviewed and evaluated these images and lab results as part of my medical decision-making.   EKG Interpretation None     CRITICAL CARE Performed by: Donnetta HutchingOOK,Tersea Aulds Total critical care time: 30 minutes Critical care time was exclusive of separately billable procedures and treating other patients. Critical care was necessary to treat or prevent imminent or life-threatening deterioration. Critical care was time spent personally by me on the following activities: development of treatment plan with patient and/or surrogate as well as nursing, discussions with consultants, evaluation of patient's  response to treatment, examination of patient, obtaining history from patient or surrogate, ordering and performing treatments and interventions, ordering and review of laboratory studies, ordering and review of radiographic studies, pulse oximetry and re-evaluation of patient's condition. MDM   Final diagnoses:  Left-sided epistaxis  Elevated INR    Patient was rechecked several times in his ED course. Good hemostasis was noted in the left nares. Hemoglobin has remained stable. Blood pressure and pulse within normal limits.  INR noted to be 4.75. Will hold Coumadin on Saturday and Sunday and recheck on Monday. Patient will follow-up with otolaryngologist on Tuesday or Wednesday    Donnetta HutchingBrian Kitana Gage, MD 02/27/16 [phone]

## 2016-02-29 ENCOUNTER — Telehealth: Payer: Self-pay | Admitting: Physical Medicine & Rehabilitation

## 2016-02-29 ENCOUNTER — Ambulatory Visit (INDEPENDENT_AMBULATORY_CARE_PROVIDER_SITE_OTHER): Payer: Self-pay | Admitting: Cardiology

## 2016-02-29 DIAGNOSIS — I480 Paroxysmal atrial fibrillation: Secondary | ICD-10-CM

## 2016-02-29 DIAGNOSIS — Z5181 Encounter for therapeutic drug level monitoring: Secondary | ICD-10-CM

## 2016-02-29 LAB — POCT INR: INR: 4.1

## 2016-02-29 NOTE — Telephone Encounter (Signed)
Walker KehrCheryl Rainey needs a call back to extend patients Home Health orders for 3 mor weeks.  Please call her at [phone].

## 2016-02-29 NOTE — Telephone Encounter (Signed)
Approval given but further orders need to go through [facility] since he does not have any further appts.

## 2016-03-03 ENCOUNTER — Encounter: Payer: Self-pay | Admitting: Physician Assistant

## 2016-03-03 ENCOUNTER — Ambulatory Visit (INDEPENDENT_AMBULATORY_CARE_PROVIDER_SITE_OTHER): Payer: Self-pay | Admitting: Physician Assistant

## 2016-03-03 ENCOUNTER — Telehealth: Payer: Self-pay

## 2016-03-03 ENCOUNTER — Other Ambulatory Visit: Payer: Self-pay

## 2016-03-03 ENCOUNTER — Ambulatory Visit (INDEPENDENT_AMBULATORY_CARE_PROVIDER_SITE_OTHER): Payer: Self-pay | Admitting: *Deleted

## 2016-03-03 VITALS — BP 138/76 | HR 64 | Ht 69.0 in | Wt 123.4 lb

## 2016-03-03 DIAGNOSIS — Z5181 Encounter for therapeutic drug level monitoring: Secondary | ICD-10-CM

## 2016-03-03 DIAGNOSIS — I480 Paroxysmal atrial fibrillation: Secondary | ICD-10-CM

## 2016-03-03 DIAGNOSIS — Z952 Presence of prosthetic heart valve: Secondary | ICD-10-CM

## 2016-03-03 DIAGNOSIS — Z954 Presence of other heart-valve replacement: Secondary | ICD-10-CM

## 2016-03-03 DIAGNOSIS — I25810 Atherosclerosis of coronary artery bypass graft(s) without angina pectoris: Secondary | ICD-10-CM

## 2016-03-03 DIAGNOSIS — G8918 Other acute postprocedural pain: Secondary | ICD-10-CM

## 2016-03-03 LAB — DIGOXIN LEVEL: DIGOXIN LVL: 3.8 ug/L — AB (ref 0.8–2.0)

## 2016-03-03 LAB — BASIC METABOLIC PANEL
BUN: 17 mg/dL (ref 7–25)
CALCIUM: 8.8 mg/dL (ref 8.6–10.3)
CHLORIDE: 96 mmol/L — AB (ref 98–110)
CO2: 29 mmol/L (ref 20–31)
Creat: 1.23 mg/dL (ref 0.70–1.25)
GLUCOSE: 112 mg/dL — AB (ref 65–99)
POTASSIUM: 6.1 mmol/L — AB (ref 3.5–5.3)
SODIUM: 136 mmol/L (ref 135–146)

## 2016-03-03 LAB — POCT INR: INR: 1.4

## 2016-03-03 MED ORDER — ATORVASTATIN CALCIUM 80 MG PO TABS: 80.0000 mg | ORAL_TABLET | Freq: Every day | ORAL | Status: DC

## 2016-03-03 MED ORDER — TIOTROPIUM BROMIDE MONOHYDRATE 18 MCG IN CAPS: 18.0000 ug | ORAL_CAPSULE | Freq: Every day | RESPIRATORY_TRACT | Status: DC

## 2016-03-03 MED ORDER — METOPROLOL TARTRATE 25 MG PO TABS: 25.0000 mg | ORAL_TABLET | Freq: Two times a day (BID) | ORAL | Status: DC

## 2016-03-03 MED ORDER — POTASSIUM CHLORIDE CRYS ER 20 MEQ PO TBCR: 20.0000 meq | EXTENDED_RELEASE_TABLET | Freq: Every day | ORAL | Status: DC

## 2016-03-03 MED ORDER — FUROSEMIDE 20 MG PO TABS: 20.0000 mg | ORAL_TABLET | Freq: Every day | ORAL | Status: DC

## 2016-03-03 MED ORDER — TRAMADOL HCL 50 MG PO TABS: 100.0000 mg | ORAL_TABLET | Freq: Four times a day (QID) | ORAL | Status: DC | PRN

## 2016-03-03 MED ORDER — BUDESONIDE-FORMOTEROL FUMARATE 160-4.5 MCG/ACT IN AERO: 2.0000 | INHALATION_SPRAY | Freq: Two times a day (BID) | RESPIRATORY_TRACT | Status: DC

## 2016-03-03 MED ORDER — DIGOXIN 125 MCG PO TABS: 0.1250 mg | ORAL_TABLET | Freq: Every day | ORAL | Status: DC

## 2016-03-03 NOTE — Patient Instructions (Addendum)
Medication Instructions:  Your physician has recommended you make the following change in your medication:  START Atorvastatin 80mg  daily  You have been given a written Rx for all your cardiac medications today   Labwork: Bmet, Dig level today  Your physician recommends that you return for a FASTING lipid profile and lft in 3 months   Testing/Procedures: None ordered  Follow-Up: Your physician recommends that you schedule a follow-up appointment in 3 months with Dr.Mcalhany   Any Other Special Instructions Will Be Listed Below (If Applicable).     If you need a refill on your cardiac medications before your next appointment, please call your pharmacy.

## 2016-03-03 NOTE — Progress Notes (Signed)
Cardiology Office Note   Date:  03/03/2016   ID:  CALIEB LICHTMAN, DOB 1950-11-29, MRN [id number]  PCP:  Rose Fillers, PA-C  Cardiologist:  Dr. Clifton James   Chief Complaint  Patient presents with  . Follow-up    seen for Dr. Clifton James      History of Present Illness: CAID RADIN is a 65 y.o. male who presents for follow-up. He has past medical history of COPD, HTN, HLD, history of alcohol abuse quit in 2004 and tobacco use. He had a prolonged recent hospitalization after presented with one day of diffuse chest discomfort in the setting of 6 days of fever, productive cough, dyspnea. He was found to be hypoxic with O2 saturation 91% in the ED due to pneumonia. He was also found to have dilated ascending aortic aneurysm and severe AS. He was intubated on 2/19 and self extubated on 12/28/2015. Echo on 12/28/2015 did show EF 35-40% with wall motion abnormality, severe AS, mild MR, 42 mm aortic root, akinetic RV apex. He eventually underwent cardiac catheterization on 12/29/2015 which showed three-vessel CAD with 50% ostial left main. Cardiothoracic surgery was consulted who recommended CABG with aortic root replacement and also aortic valve replacement for right pneumonia resolved. Presurgical eval showed severe COPD, patient understood the high risk and willing to proceed with surgery despite the risk. His hospitalization was also complicated by occurrence of episodes of PAF, he was started on amiodarone loading prior to surgery.  He underwent CABG 4 with LIMA to LAD, SVG to OM, seq SVG to PDA/PLA, aortic valve replacement using 25 mm Edwards Magna-ease pericardial valve, supra-coronary ascending aortic and hemi-arch replacement using a 30 mm Hemashield under deep hypothermic circulatory arrest, right axillary artery 8 mm Hemashield graft for arterial cannulization and antegrade cerebral perfusion. After the major surgery, he developed hypotension, metabolic acidosis with a suspected  diagnosis of sepsis. Despite this, all cultures has been negative. He did finish one-week course of vancomycin and imipenem. Chest x-ray on 3/13 showed bilateral basilar opacity consistent with pleural effusion. He eventually underwent bilateral thoracentesis which removed a 1800 mL of serosanguineous fluid on the left and 1400 mL of serosanguineous fluid on the right. Unfortunately the pleural effusion recurred and he eventually underwent insertion of bilateral pleurX catheter on 3/20. He was later moved to inpatient rehabilitation and discharged on 4/7. Total hospitalization is from 2/18 - 4/7. He presents today for outpatient cardiology follow-up. It appears he has been started on Coumadin in the hospital and amiodarone stopped. He is also on aspirin as well. He is also on metoprolol and digoxin.  I saw him in the clinic on 02/18/2016 as part of post hospital follow-up. He was clearly fluid overloaded at the time. I started on 20 mg daily of Lasix. He presents today for follow-up to reassess fluid status. His Pleurx catheter continued to drain roughly 200 mouth fluid on daily basis on both side. He has seen by cardiothoracic surgery who decided to leave the Pleurx catheter in the meantime and reassess the timing of removall. He also had an episode of nosebleed and went to the ED. He is pending an ENT visit.   Past Medical History  Diagnosis Date  . COPD (chronic obstructive pulmonary disease) (HCC)     severe on PFT prior to CABG  . Hypertension   . Hyperlipidemia   . Alcohol abuse     Prior  . Tobacco abuse     Prior  . CAD (coronary artery  disease) of bypass graft      4v CABG (LIMA to LAD, SVG to OM, seq SVG to PDA/PLA) by Dr. Laneta Simmers 01/07/2016  . dilated ascending aorta     s/p supra-coronary ascending aortic and hemi-arch replacement using a 30 mm Hemashield under deep hypothermic circulatory arrest during CABG 01/07/2016  . Severe aortic stenosis     s/p aortic valve replacement using 25 mm  Edwards Magna-ease pericardial valve by Dr. Laneta Simmers during CABG 01/07/2016  . Persistent atrial fibrillation (HCC)     in the setting of NSTEMI and persisted after [facility] 01/2016  . Recurrent left pleural effusion     s/p PleurX drain, occured after CABG  . Recurrent right pleural effusion     s/p PleurX drain, occured after CABG    Past Surgical History  Procedure Laterality Date  . Ruptured disc repair  2009 or 2010  . Cardiac catheterization N/A 12/29/2015    Procedure: Right/Left Heart Cath and Coronary Angiography;  Surgeon: Peter M Swaziland, MD;  Location: [facility] INVASIVE CV LAB;  Service: Cardiovascular;  Laterality: N/A;  . Coronary artery bypass graft N/A 01/07/2016    Procedure: CORONARY ARTERY BYPASS GRAFTING (CABG), ON PUMP, TIMES FOUR, USING LEFT INTERNAL MAMMARY ARTERY, BILATERAL GREATER SAPHENOUS VEINS HARVESTED ENDOSCOPICALLY;  Surgeon: Alleen Borne, MD;  Location: [facility] OR;  Service: Open Heart Surgery;  Laterality: N/A;  LIMA to LAD, SVG to OM, SVG SEQUENTIALLY to PDA and PLB  . Tee without cardioversion N/A 01/07/2016    Procedure: TRANSESOPHAGEAL ECHOCARDIOGRAM (TEE);  Surgeon: Alleen Borne, MD;  Location: [facility] OR;  Service: Open Heart Surgery;  Laterality: N/A;  . Thoracic aortic aneurysm repair N/A 01/07/2016    Procedure: CIRC ARREST AND REPLACEMENT OF ASCENDING THORACIC  ANEURYSM;  Surgeon: Alleen Borne, MD;  Location: [facility] OR;  Service: Open Heart Surgery;  Laterality: N/A;  . Chest tube insertion Bilateral 01/25/2016    Procedure: INSERTION Bilateral PLEURAL DRAINAGE CATHETER;  Surgeon: Alleen Borne, MD;  Location: [facility] OR;  Service: Thoracic;  Laterality: Bilateral;     Current Outpatient Prescriptions  Medication Sig Dispense Refill  . aspirin 81 MG EC tablet Take 1 tablet (81 mg total) by mouth daily. 30 tablet 1  . budesonide-formoterol (SYMBICORT) 160-4.5 MCG/ACT inhaler Inhale 2 puffs into the lungs 2 (two) times daily. 1 Inhaler 2  . digoxin (LANOXIN) 0.125 MG tablet Take 1  tablet (0.125 mg total) by mouth daily. 90 tablet 1  . famotidine (PEPCID) 20 MG tablet Take 1 tablet (20 mg total) by mouth daily. 30 tablet 0  . furosemide (LASIX) 20 MG tablet Take 1 tablet (20 mg total) by mouth daily. 90 tablet 1  . guaiFENesin (MUCINEX) 600 MG 12 hr tablet Take 2 tablets (1,200 mg total) by mouth 2 (two) times daily. For cough 100 tablet 0  . metoprolol tartrate (LOPRESSOR) 25 MG tablet Take 1 tablet (25 mg total) by mouth 2 (two) times daily. 180 tablet 3  . oxyCODONE (OXY IR/ROXICODONE) 5 MG immediate release tablet Take 1-2 tablets (5-10 mg total) by mouth 2 (two) times daily as needed for severe pain. 45 tablet 0  . tiotropium (SPIRIVA) 18 MCG inhalation capsule Place 1 capsule (18 mcg total) into inhaler and inhale daily. 30 capsule 2  . warfarin (COUMADIN) 5 MG tablet Take 0.5 tablets (2.5 mg total) by mouth daily at 6 PM. 30 tablet 0  . atorvastatin (LIPITOR) 80 MG tablet Take 1 tablet (80 mg total) by mouth daily.  90 tablet 3  . traMADol (ULTRAM) 50 MG tablet Take 2 tablets (100 mg total) by mouth every 6 (six) hours as needed. 40 tablet 0   No current facility-administered medications for this visit.    Allergies:   Review of patient's allergies indicates no known allergies.    Social History:  The patient  reports that he has quit smoking. He has never used smokeless tobacco. He reports that he does not drink alcohol or use illicit drugs.   Family History:  The patient's family history includes Diabetes in his father; Kidney disease in his brother, father, and sister; Stroke in his father and mother. There is no history of Heart attack.    ROS:  Please see the history of present illness.   Otherwise, review of systems are positive for chest tube drainage.   All other systems are reviewed and negative.    PHYSICAL EXAM: VS:  BP 138/76 mmHg  Pulse 64  Ht 5\' 9"  (1.753 m)  Wt 123 lb 6.4 oz (55.974 kg)  BMI 18.21 kg/m2  SpO2 95% , BMI Body mass index is  18.21 kg/(m^2). GEN: Well nourished, well developed, in no acute distress HEENT: normal Neck: no JVD, carotid bruits, or masses Cardiac: irregularly irregular; no murmurs, rubs, or gallops,no edema  Respiratory:  clear to auscultation bilaterally, normal work of breathing.  GI: soft, nontender, nondistended, + BS MS: no deformity or atrophy Skin: warm and dry, no rash Neuro:  Strength and sensation are intact Psych: euthymic mood, full affect   EKG:  EKG is ordered today. The ekg ordered today demonstrates afib with TWI in inferolateral leads   Recent Labs: 12/27/2015: B Natriuretic Peptide 1306.4* 01/04/2016: TSH 2.395 02/10/2016: Magnesium 1.7 02/26/2016: ALT 18 02/27/2016: Hemoglobin 12.5*; Platelets 229 03/03/2016: BUN 17; Creat 1.23; Potassium 6.1*; Sodium 136    Lipid Panel    Component Value Date/Time   CHOL 168 02/26/2016 1201   TRIG 114 02/26/2016 1201   HDL 39* 02/26/2016 1201   CHOLHDL 4.3 02/26/2016 1201   VLDL 23 02/26/2016 1201   LDLCALC 106 02/26/2016 1201      Wt Readings from Last 3 Encounters:  03/03/16 123 lb 6.4 oz (55.974 kg)  02/25/16 134 lb 9.6 oz (61.054 kg)  02/18/16 140 lb 6.4 oz (63.685 kg)      Other studies Reviewed: Additional studies/ records that were reviewed today include:   CABG x4/AVR/Aortic root replacement 01/07/2016 by Dr. Laneta SimmersBartle Procedure:   Median Sternotomy  Extracorporeal circulation 3. Coronary artery bypass graft surgery x 4 4. Endoscopic vein harvest from both legs. 5. Aortic valve replacement using a 25 mm Edwards Magna-ease pericardial valve. 6. Supra-coronary ascending aortic and hemi-arch replacement using a 30 mm Hemashield graft under deep hypothermic circulatory arrest. 7. Right axillary artery 8 mm Hemashield graft for arterial cannulation and antegrade cerebral perfusion . 8. Insertion of left femoral arterial line.    Cath 12/29/2015 Conclusion     Ost RCA to Prox RCA lesion, 90%  stenosed.  Mid RCA lesion, 45% stenosed.  Ost LM lesion, 50% stenosed.  Ost LAD to Mid LAD lesion, 50% stenosed.  Dist LAD lesion, 70% stenosed.  Ost Cx to Prox Cx lesion, 70% stenosed.  1. 3 vessel obstructive CAD  - 50% ostial left main  - diffuse 50% proximal to mid LAD, 70% distal LAD- heavily calcified.  - 70% proximal LCx  -90% proximal RCA 2. Moderate pulmonary HTN with elevated LV filling pressures.  Plan: will need to  consider for CABG and AVR. Plavix held.     Echo 01/27/2016 LV EF: 45% - 50%  ------------------------------------------------------------------- Indications: Atrial fibrillation - 427.31. Pericardial effusion 423.9.  ------------------------------------------------------------------- Study Conclusions  - Left ventricle: The cavity size was normal. Systolic function was  mildly reduced. The estimated ejection fraction was in the range  of 45% to 50%. Images were inadequate for LV wall motion  assessment. - Ventricular septum: Septal motion showed paradox. - Left atrium: The atrium was severely dilated. - Right ventricle: The cavity size was mildly dilated. Systolic  function was reduced. - Right atrium: The atrium was mildly dilated. - Pericardium, extracardiac: A small pericardial effusion was  identified posterior to the heart. There was no evidence of  hemodynamic compromise.        Review of the above records demonstrates:   Recently Admitted to the hospital and underwent triple-vessel CABG, aortic valve replacement and aortic root replacement. Last seen by me in the clinic on 02/18/2016, noticed to be fluid overloaded and started on Lasix.   ASSESSMENT AND PLAN:  1.  Acute on chronic combined systolic and diastolic HF: He appears to be largely euvolemic today. His lower extremity edema has resolved. I will continue the current dose of Lasix.  2. CAD s/p 4v CABG (LIMA to LAD, SVG to OM, seq SVG to PDA/PLA) by  Dr. Laneta Simmers 01/07/2016: no angina  3. Dilated ascending aorta s/p supra-coronary ascending aortic and hemi-arch replacement using a 30 mm Hemashield under deep hypothermic circulatory arrest during CABG 01/07/2016  4. Severe AS s/p aortic valve replacement using 25 mm Edwards Magna-ease pericardial valve: stable  5. Persistent atrial fibrillation: started during perioperative phase of CABG, previous EKG reviewed, he actually arrive in the hospital in NSR, prior to CABG, he had intermittent afib and was placed on amiodarone. However it seems his afib became persistent after CABG, however it is rate controlled on metoprolol and digoxin. He is anticoagulated with coumadin - CHA2DS2-Vasc score 3 (HF, HTN, CAD), continue coumadin.   - He did have a episode of nosebleed and was fixed in the ED. He is going to see ENT doctor. Right now it is his home health nurse who has been checking his Coumadin level. He says he has 3 more weeks of home health. Given recent supratherapeutic INR and nosebleed, we will check his Coumadin level today. He did hold Coumadin for 3 days after recent nosebleed and supratherapeutic INR.  - He is still taking 0.25 mg daily of digoxin, I am concerned about digoxin toxicity, will check dig level today.  6. HTN: Blood pressure 138/76 today. We'll continue on current medication for now. If still high on follow-up. We will increase metoprolol further.  7. HLD:  His Lipitor was stopped due to elevated transaminase in the setting of shock liver and hypotension, last LFT was normal, I will restart high-dose Lipitor, and repeat fasting lipid and LFT in 3 month     Current medicines are reviewed at length with the patient today.  The patient does not have concerns regarding medicines.  The following changes have been made:  Decrease digoxin to 0.125mg  daily. Start lipitor 80mg    Labs/ tests ordered today include:  Orders Placed This Encounter  Procedures  . Digoxin level    . Basic metabolic panel  . Lipid panel  . Hepatic function panel  . EKG 12-Lead     Disposition:   FU with Dr. Clifton James in 3 months  Signed, Azalee Course, [place]  03/03/2016  11:21 PM    [facility] [address] [place], [place], [place]  [address] Phone: [phone]; Fax: [phone]

## 2016-03-03 NOTE — Telephone Encounter (Signed)
Jessica from [facility] called to report critical lab. Digoxin = 3.8  To Azalee CourseHao Meng for recommendations.

## 2016-03-03 NOTE — Telephone Encounter (Signed)
-----   Message from Salt[place]ao Meng, [place] sent at 03/03/2016  4:21 PM EDT ----- Digoxin level high, stop digoxin for 3 days, then restart at lowered dose that was prescribed today, recheck digoxin level in 2 weeks. Also potassium level high, stop potassium supplement, recheck BMET in 2 weeks.

## 2016-03-03 NOTE — Telephone Encounter (Signed)
Informed patient's DPR of results and verbal understanding expressed.  Instructed her to HOLD DIGOXIN for 3 days and restart lower dose prescribed today on Monday, May 1. Instructed her to STOP POTASSIUM. BMET and digoxin level scheduled 5/10. She agrees with treatment plan and was grateful for call.

## 2016-03-03 NOTE — Telephone Encounter (Signed)
Pt's sister is calling back to speak with you about the pt's lab results .

## 2016-03-03 NOTE — Telephone Encounter (Signed)
RX for Tramadol faxed to [facility] in [place]

## 2016-03-07 ENCOUNTER — Ambulatory Visit (INDEPENDENT_AMBULATORY_CARE_PROVIDER_SITE_OTHER): Payer: Self-pay | Admitting: Internal Medicine

## 2016-03-07 DIAGNOSIS — Z5181 Encounter for therapeutic drug level monitoring: Secondary | ICD-10-CM

## 2016-03-07 DIAGNOSIS — I480 Paroxysmal atrial fibrillation: Secondary | ICD-10-CM

## 2016-03-07 LAB — POCT INR: INR: 1.5

## 2016-03-07 MED ORDER — WARFARIN SODIUM 5 MG PO TABS
2.5000 mg | ORAL_TABLET | Freq: Every day | ORAL | Status: DC
Start: 2016-03-07 — End: 2016-03-07

## 2016-03-07 MED ORDER — WARFARIN SODIUM 2.5 MG PO TABS: ORAL_TABLET | ORAL | Status: DC

## 2016-03-11 DIAGNOSIS — J90 Pleural effusion, not elsewhere classified: Secondary | ICD-10-CM

## 2016-03-11 DIAGNOSIS — I2510 Atherosclerotic heart disease of native coronary artery without angina pectoris: Secondary | ICD-10-CM

## 2016-03-11 DIAGNOSIS — Z4682 Encounter for fitting and adjustment of non-vascular catheter: Secondary | ICD-10-CM

## 2016-03-11 DIAGNOSIS — D62 Acute posthemorrhagic anemia: Secondary | ICD-10-CM

## 2016-03-14 ENCOUNTER — Ambulatory Visit (INDEPENDENT_AMBULATORY_CARE_PROVIDER_SITE_OTHER): Payer: Self-pay | Admitting: Interventional Cardiology

## 2016-03-14 DIAGNOSIS — Z5181 Encounter for therapeutic drug level monitoring: Secondary | ICD-10-CM

## 2016-03-14 DIAGNOSIS — I480 Paroxysmal atrial fibrillation: Secondary | ICD-10-CM

## 2016-03-14 LAB — POCT INR: INR: 1.7

## 2016-03-15 ENCOUNTER — Other Ambulatory Visit: Payer: Self-pay | Admitting: Surgery

## 2016-03-15 DIAGNOSIS — Z951 Presence of aortocoronary bypass graft: Secondary | ICD-10-CM

## 2016-03-16 ENCOUNTER — Other Ambulatory Visit (INDEPENDENT_AMBULATORY_CARE_PROVIDER_SITE_OTHER): Payer: Self-pay | Admitting: *Deleted

## 2016-03-16 ENCOUNTER — Ambulatory Visit
Admission: RE | Admit: 2016-03-16 | Discharge: 2016-03-16 | Disposition: A | Discharge status: Home / Self Care | Payer: No Typology Code available for payment source | Source: Ambulatory Visit | Attending: Surgery | Admitting: Surgery

## 2016-03-16 ENCOUNTER — Ambulatory Visit (INDEPENDENT_AMBULATORY_CARE_PROVIDER_SITE_OTHER): Payer: Self-pay | Admitting: Surgery

## 2016-03-16 ENCOUNTER — Other Ambulatory Visit: Payer: Self-pay | Admitting: *Deleted

## 2016-03-16 ENCOUNTER — Encounter: Payer: Self-pay | Admitting: Surgery

## 2016-03-16 VITALS — BP 102/63 | HR 68 | Resp 16 | Ht 69.0 in | Wt 118.6 lb

## 2016-03-16 DIAGNOSIS — J90 Pleural effusion, not elsewhere classified: Secondary | ICD-10-CM

## 2016-03-16 DIAGNOSIS — Z954 Presence of other heart-valve replacement: Secondary | ICD-10-CM

## 2016-03-16 DIAGNOSIS — Z8679 Personal history of other diseases of the circulatory system: Secondary | ICD-10-CM

## 2016-03-16 DIAGNOSIS — I480 Paroxysmal atrial fibrillation: Secondary | ICD-10-CM

## 2016-03-16 DIAGNOSIS — Z951 Presence of aortocoronary bypass graft: Secondary | ICD-10-CM

## 2016-03-16 DIAGNOSIS — Z952 Presence of prosthetic heart valve: Secondary | ICD-10-CM

## 2016-03-16 DIAGNOSIS — Z9889 Other specified postprocedural states: Secondary | ICD-10-CM

## 2016-03-16 DIAGNOSIS — Z5181 Encounter for therapeutic drug level monitoring: Secondary | ICD-10-CM

## 2016-03-16 LAB — BASIC METABOLIC PANEL
BUN: 16 mg/dL (ref 7–25)
CALCIUM: 8.4 mg/dL — AB (ref 8.6–10.3)
CO2: 29 mmol/L (ref 20–31)
Chloride: 101 mmol/L (ref 98–110)
Creat: 1.07 mg/dL (ref 0.70–1.25)
GLUCOSE: 94 mg/dL (ref 65–99)
POTASSIUM: 4.4 mmol/L (ref 3.5–5.3)
SODIUM: 138 mmol/L (ref 135–146)

## 2016-03-16 LAB — DIGOXIN LEVEL: DIGOXIN LVL: 1 ug/L (ref 0.8–2.0)

## 2016-03-16 NOTE — Progress Notes (Signed)
HPI:  He returns today for follow up of recurrent bilateral pleural effusions with bilateral PleurX catheters in place. Since I last saw him on 4/21 he has been feeling better. His stamina is improving and he is walking daily. He is eating better. His lower extremity edema has resolved with lasix. He is off oxygen. His left pleurX catheter is draining 100 cc every two days. The right catheter is draining 250-350 cc every two days. He is still using his inhalers but only because he got a free sample. Each one is 400$ per month and he can not afford that. He is not sure if they are helping him anyway. INR is being followed by cardiology and was supratherapeutic in late April and dose reduced.  Last INR was 1.7 and slowly rising.  Current Outpatient Prescriptions  Medication Sig Dispense Refill  . aspirin 81 MG EC tablet Take 1 tablet (81 mg total) by mouth daily. 30 tablet 1  . atorvastatin (LIPITOR) 80 MG tablet Take 1 tablet (80 mg total) by mouth daily. 90 tablet 3  . budesonide-formoterol (SYMBICORT) 160-4.5 MCG/ACT inhaler Inhale 2 puffs into the lungs 2 (two) times daily. 1 Inhaler 2  . digoxin (LANOXIN) 0.125 MG tablet Take 1 tablet (0.125 mg total) by mouth daily. 90 tablet 1  . famotidine (PEPCID) 20 MG tablet Take 1 tablet (20 mg total) by mouth daily. 30 tablet 0  . furosemide (LASIX) 20 MG tablet Take 1 tablet (20 mg total) by mouth daily. 90 tablet 1  . metoprolol tartrate (LOPRESSOR) 25 MG tablet Take 1 tablet (25 mg total) by mouth 2 (two) times daily. 180 tablet 3  . tiotropium (SPIRIVA) 18 MCG inhalation capsule Place 1 capsule (18 mcg total) into inhaler and inhale daily. 30 capsule 2  . traMADol (ULTRAM) 50 MG tablet Take 2 tablets (100 mg total) by mouth every 6 (six) hours as needed. 40 tablet 0  . warfarin (COUMADIN) 2.5 MG tablet Take 1 tab daily except 1/2 tab on Fri or as directed by [facility]madin Clinic 35 tablet 1  . guaiFENesin (MUCINEX) 600 MG 12 hr tablet Take 2 tablets  (1,200 mg total) by mouth 2 (two) times daily. For cough (Patient taking differently: Take 1,200 mg by mouth daily. For cough) 100 tablet 0   No current facility-administered medications for this visit.     Physical Exam: BP 102/63 mmHg  Pulse 68  Resp 16  Ht  (1.753 m)  Wt 118 lb 9.6 oz (53.797 kg)  BMI 17.51 kg/m2  SpO2 93% He looks well Lungs are clear Cardiac exam shows an irregularly irregular rhythm with normal valve sounds. There is no peripheral edema  Diagnostic Tests:  CLINICAL DATA: Status post coronary artery bypass graft. Shortness of breath.  EXAM: CHEST 2 VIEW  COMPARISON: February 25, 2016.  FINDINGS: The heart size and mediastinal contours are within normal limits. No pneumothorax is noted. Sternotomy wires are noted. Status post cardiac valve repair. Old bilateral rib fractures are noted. Stable mild bilateral pleural effusions are noted. Bibasilar opacities are noted most consistent with subsegmental atelectasis or scarring.  IMPRESSION: Stable bibasilar opacities are noted concerning for scarring or subsegmental atelectasis with mild bilateral pleural effusions.   Electronically Signed  By: Lupita Raider, M.D.  On: 03/16/2016 09:31   Impression:  The drainage from the left catheter is minimal and it can be removed. The drainage from the right catheter is still a little high to remove so will  keep it in for a few more weeks and change to every third day drainage.  Plan:  Left pleurX catheter removal in short stay.  Return to see me with CXR in 2 weeks.   Alleen BorneBryan K Bartle, MD [facility] [phone]

## 2016-03-16 NOTE — Addendum Note (Signed)
Addended by: Tonita PhoenixBOWDEN, Harel Repetto K on: 03/16/2016 10:52 AM   Modules accepted: Orders

## 2016-03-17 ENCOUNTER — Ambulatory Visit (HOSPITAL_COMMUNITY)
Admission: RE | Admit: 2016-03-17 | Discharge: 2016-03-17 | Disposition: A | Discharge status: Home / Self Care | Payer: Medicaid Other | Source: Ambulatory Visit | Attending: Surgery | Admitting: Surgery

## 2016-03-17 ENCOUNTER — Encounter (HOSPITAL_COMMUNITY)
Admission: RE | Disposition: A | Discharge status: Home / Self Care | Payer: Self-pay | Source: Ambulatory Visit | Attending: Surgery

## 2016-03-17 DIAGNOSIS — J90 Pleural effusion, not elsewhere classified: Secondary | ICD-10-CM

## 2016-03-17 DIAGNOSIS — Z4689 Encounter for fitting and adjustment of other specified devices: Secondary | ICD-10-CM | POA: Insufficient documentation

## 2016-03-17 HISTORY — PX: REMOVAL OF PLEURAL DRAINAGE CATHETER: SHX5080

## 2016-03-17 SURGERY — REMOVAL, CLOSED DRAINAGE CATHETER SYSTEM, PLEURAL
Anesthesia: Monitor Anesthesia Care | Laterality: Left

## 2016-03-17 MED ORDER — LIDOCAINE HCL (PF) 1 % IJ SOLN
INTRAMUSCULAR | Status: AC
  Filled 2016-03-17: qty 30

## 2016-03-17 NOTE — Discharge Instructions (Signed)
Ilsa IhaWayne Gold,Pa disscused discharged instructions with Craig [name] and sister. Written instructions given to  Craig [name].

## 2016-03-17 NOTE — Progress Notes (Signed)
      [address]       [place] [id number]             [phone]     Left pleurx catheter removal -usual technique  10 cc 1% lidocaine local anesthetic  Betadine prep  Tolerated well    Beckam Abdulaziz E, PA-C

## 2016-03-18 ENCOUNTER — Encounter (HOSPITAL_COMMUNITY): Payer: Self-pay | Admitting: Surgery

## 2016-03-21 ENCOUNTER — Ambulatory Visit (INDEPENDENT_AMBULATORY_CARE_PROVIDER_SITE_OTHER): Payer: Medicaid Other | Admitting: Interventional Cardiology

## 2016-03-21 DIAGNOSIS — Z5181 Encounter for therapeutic drug level monitoring: Secondary | ICD-10-CM

## 2016-03-21 DIAGNOSIS — I480 Paroxysmal atrial fibrillation: Secondary | ICD-10-CM

## 2016-03-21 LAB — POCT INR: INR: 1.6

## 2016-03-22 NOTE — H&P (Signed)
[address]       [place] [address]             [phone]      Cardiothoracic Surgery History and Physical   HPI:  He returns today for follow up of recurrent bilateral pleural effusions with bilateral PleurX catheters in place. Since I last saw him on 4/21 he has been feeling better. His stamina is improving and he is walking daily. He is eating better. His lower extremity edema has resolved with lasix. He is off oxygen. His left pleurX catheter is draining 100 cc every two days. The right catheter is draining 250-350 cc every two days. He is still using his inhalers but only because he got a free sample. Each one is 400$ per month and he can not afford that. He is not sure if they are helping him anyway. INR is being followed by cardiology and was supratherapeutic in late April and dose reduced. Last INR was 1.7 and slowly rising.  Current Outpatient Prescriptions  Medication Sig Dispense Refill  . aspirin 81 MG EC tablet Take 1 tablet (81 mg total) by mouth daily. 30 tablet 1  . atorvastatin (LIPITOR) 80 MG tablet Take 1 tablet (80 mg total) by mouth daily. 90 tablet 3  . budesonide-formoterol (SYMBICORT) 160-4.5 MCG/ACT inhaler Inhale 2 puffs into the lungs 2 (two) times daily. 1 Inhaler 2  . digoxin (LANOXIN) 0.125 MG tablet Take 1 tablet (0.125 mg total) by mouth daily. 90 tablet 1  . famotidine (PEPCID) 20 MG tablet Take 1 tablet (20 mg total) by mouth daily. 30 tablet 0  . furosemide (LASIX) 20 MG tablet Take 1 tablet (20 mg total) by mouth daily. 90 tablet 1  . metoprolol tartrate (LOPRESSOR) 25 MG tablet Take 1 tablet (25 mg total) by mouth 2 (two) times daily. 180 tablet 3  . tiotropium (SPIRIVA) 18 MCG inhalation capsule Place 1 capsule (18 mcg total) into inhaler and inhale daily. 30 capsule 2  . traMADol (ULTRAM) 50 MG tablet Take 2 tablets (100 mg total) by mouth every 6 (six) hours as needed. 40  tablet 0  . warfarin (COUMADIN) 2.5 MG tablet Take 1 tab daily except 1/2 tab on Fri or as directed by Coumadin Clinic 35 tablet 1  . guaiFENesin (MUCINEX) 600 MG 12 hr tablet Take 2 tablets (1,200 mg total) by mouth 2 (two) times daily. For cough (Patient taking differently: Take 1,200 mg by mouth daily. For cough) 100 tablet 0   No current facility-administered medications for this visit.     Physical Exam: BP 102/63 mmHg  Pulse 68  Resp 16  Ht  (1.753 m)  Wt 118 lb 9.6 oz (53.797 kg)  BMI 17.51 kg/m2  SpO2 93% He looks well Lungs are clear Cardiac exam shows an irregularly irregular rhythm with normal valve sounds. There is no peripheral edema  Diagnostic Tests:  CLINICAL DATA: Status post coronary artery bypass graft. Shortness of breath.  EXAM: CHEST 2 VIEW  COMPARISON: February 25, 2016.  FINDINGS: The heart size and mediastinal contours are within normal limits. No pneumothorax is noted. Sternotomy wires are noted. Status post cardiac valve repair. Old bilateral rib fractures are noted. Stable mild bilateral pleural effusions are noted. Bibasilar opacities are noted most consistent with subsegmental atelectasis or scarring.  IMPRESSION: Stable bibasilar opacities are noted concerning for scarring or subsegmental atelectasis with mild bilateral pleural effusions.   Electronically Signed  By: Fayrene Fearing  Christen ButterGreen Jr, M.D.  On: 03/16/2016 09:31   Impression:  The drainage from the left catheter is minimal and it can be removed. The drainage from the right catheter is still a little high to remove so will keep it in for a few more weeks and change to every third day drainage.  Plan:  Left pleurX catheter removal in short stay.  Return to see me with CXR in 2 weeks.   Alleen BorneBryan K Norie Latendresse, MD [facility] [phone]

## 2016-03-24 ENCOUNTER — Ambulatory Visit (INDEPENDENT_AMBULATORY_CARE_PROVIDER_SITE_OTHER): Payer: Self-pay | Admitting: *Deleted

## 2016-03-24 DIAGNOSIS — J90 Pleural effusion, not elsewhere classified: Secondary | ICD-10-CM

## 2016-03-24 DIAGNOSIS — Z951 Presence of aortocoronary bypass graft: Secondary | ICD-10-CM

## 2016-03-24 DIAGNOSIS — Z4802 Encounter for removal of sutures: Secondary | ICD-10-CM

## 2016-03-24 NOTE — Progress Notes (Signed)
Craig [name] has returned to the office for suture removal after his left pleurX catheter was removed. One suture was easily removed and the site is very well healed. His right pleurX remains and is being drained every three days averaging 400-450 cc. He will return as scheduled with a CXR.

## 2016-03-28 ENCOUNTER — Ambulatory Visit (INDEPENDENT_AMBULATORY_CARE_PROVIDER_SITE_OTHER): Payer: Medicaid Other | Admitting: Cardiology

## 2016-03-28 DIAGNOSIS — Z5181 Encounter for therapeutic drug level monitoring: Secondary | ICD-10-CM

## 2016-03-28 DIAGNOSIS — I480 Paroxysmal atrial fibrillation: Secondary | ICD-10-CM

## 2016-03-28 LAB — POCT INR: INR: 2

## 2016-03-29 ENCOUNTER — Other Ambulatory Visit: Payer: Self-pay | Admitting: *Deleted

## 2016-03-29 DIAGNOSIS — Z951 Presence of aortocoronary bypass graft: Secondary | ICD-10-CM

## 2016-03-30 ENCOUNTER — Ambulatory Visit (INDEPENDENT_AMBULATORY_CARE_PROVIDER_SITE_OTHER): Payer: Self-pay | Admitting: Surgery

## 2016-03-30 ENCOUNTER — Ambulatory Visit
Admission: RE | Admit: 2016-03-30 | Discharge: 2016-03-30 | Disposition: A | Discharge status: Home / Self Care | Payer: Medicaid Other | Source: Ambulatory Visit | Attending: Surgery | Admitting: Surgery

## 2016-03-30 ENCOUNTER — Encounter: Payer: Self-pay | Admitting: Surgery

## 2016-03-30 VITALS — BP 114/72 | HR 88 | Resp 16 | Ht 69.0 in | Wt 118.0 lb

## 2016-03-30 DIAGNOSIS — Z951 Presence of aortocoronary bypass graft: Secondary | ICD-10-CM

## 2016-03-30 DIAGNOSIS — J948 Other specified pleural conditions: Secondary | ICD-10-CM

## 2016-03-30 DIAGNOSIS — J90 Pleural effusion, not elsewhere classified: Secondary | ICD-10-CM

## 2016-03-30 NOTE — Progress Notes (Signed)
     HPI:  The patient returns today for follow up a persistent right pleural effusion treated with a PleurX catheter. The catheter has been draining 350 cc every three days for the last week. The left catheter was removed on 5/11. He is feeling well.  Current Outpatient Prescriptions  Medication Sig Dispense Refill  . aspirin 81 MG EC tablet Take 1 tablet (81 mg total) by mouth daily. 30 tablet 1  . atorvastatin (LIPITOR) 80 MG tablet Take 1 tablet (80 mg total) by mouth daily. 90 tablet 3  . budesonide-formoterol (SYMBICORT) 160-4.5 MCG/ACT inhaler Inhale 2 puffs into the lungs 2 (two) times daily as needed.    . digoxin (LANOXIN) 0.125 MG tablet Take 1 tablet (0.125 mg total) by mouth daily. 90 tablet 1  . famotidine (PEPCID) 20 MG tablet Take 1 tablet (20 mg total) by mouth daily. 30 tablet 0  . furosemide (LASIX) 20 MG tablet Take 1 tablet (20 mg total) by mouth daily. 90 tablet 1  . metoprolol tartrate (LOPRESSOR) 25 MG tablet Take 1 tablet (25 mg total) by mouth 2 (two) times daily. 180 tablet 3  . sodium chloride (OCEAN) 0.65 % SOLN nasal spray Place 1 spray into both nostrils as needed for congestion.    Marland Kitchen. tiotropium (SPIRIVA) 18 MCG inhalation capsule Place 18 mcg into inhaler and inhale daily as needed.    . traMADol (ULTRAM) 50 MG tablet Take 2 tablets (100 mg total) by mouth every 6 (six) hours as needed. 40 tablet 0  . warfarin (COUMADIN) 2.5 MG tablet Take 1 tab daily except 1/2 tab on Fri or as directed by [facility] (Patient taking differently: Take 1.25-2.5 mg by mouth daily at 6 PM. 1.25mg  on Fridays, 2.5mg  all other days) 35 tablet 1   No current facility-administered medications for this visit.     Physical Exam: BP 114/72 mmHg  Pulse 88  Resp 16  Ht 5\' 9"  (1.753 m)  Wt 118 lb (53.524 kg)  BMI 17.42 kg/m2  SpO2  He looks thin but well and his voice is getting stronger. Lungs are clear The left PleurX catheter site looks fine.  Diagnostic  Tests:  CLINICAL DATA: Status post CABG. Shortness of breath.  EXAM: CHEST 2 VIEW  COMPARISON: 03/16/2016 chest radiograph.  FINDINGS: Sternotomy wires appear aligned and intact. CABG clips overlie the mediastinum. 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 Left pleural catheter has been removed. Right pleural catheter terminates over the medial basilar right pleural space. Stable cardiomediastinal silhouette with normal heart size. No pneumothorax. Stable mildly hyperinflated lungs. Trace bilateral pleural effusions, slightly decreased on the right and stable on the left. No pulmonary edema. Emphysema. Healed deformities in the lateral ribs bilaterally.  IMPRESSION: 1. Trace residual bilateral pleural effusions, slightly decreased on the right and stable on the left. No pneumothorax. 2. Emphysema and hyperinflated lungs, suggesting COPD.   Electronically Signed  By: Delbert PhenixJason A Poff M.D.  On: 03/30/2016 15:58   Impression:  The right PleurX catheter is only draining 350 cc for a three day period and the CXR looks good with minimal pleural effusion bilaterally. I will keep the right catheter in for another week and continue drainage every three days.  Plan:  Continue drainage of the right catheter every three days. He will return in one week with a CXR.    Alleen BorneBryan K Wanna Gully, MD [facility] [phone]

## 2016-04-05 ENCOUNTER — Other Ambulatory Visit: Payer: Self-pay | Admitting: Surgery

## 2016-04-05 ENCOUNTER — Ambulatory Visit (INDEPENDENT_AMBULATORY_CARE_PROVIDER_SITE_OTHER): Payer: Medicaid Other | Admitting: Cardiology

## 2016-04-05 DIAGNOSIS — Z5181 Encounter for therapeutic drug level monitoring: Secondary | ICD-10-CM

## 2016-04-05 DIAGNOSIS — I480 Paroxysmal atrial fibrillation: Secondary | ICD-10-CM

## 2016-04-05 DIAGNOSIS — Z951 Presence of aortocoronary bypass graft: Secondary | ICD-10-CM

## 2016-04-05 LAB — POCT INR: INR: 2.7

## 2016-04-05 MED ORDER — WARFARIN SODIUM 2.5 MG PO TABS: ORAL_TABLET | ORAL | Status: DC

## 2016-04-06 ENCOUNTER — Encounter: Payer: Self-pay | Admitting: Surgery

## 2016-04-06 ENCOUNTER — Ambulatory Visit
Admission: RE | Admit: 2016-04-06 | Discharge: 2016-04-06 | Disposition: A | Discharge status: Home / Self Care | Payer: Medicaid Other | Source: Ambulatory Visit | Attending: Surgery | Admitting: Surgery

## 2016-04-06 ENCOUNTER — Ambulatory Visit (INDEPENDENT_AMBULATORY_CARE_PROVIDER_SITE_OTHER): Payer: Self-pay | Admitting: Surgery

## 2016-04-06 VITALS — BP 100/60 | HR 48 | Resp 16 | Ht 69.0 in | Wt 118.0 lb

## 2016-04-06 DIAGNOSIS — Z951 Presence of aortocoronary bypass graft: Secondary | ICD-10-CM

## 2016-04-06 DIAGNOSIS — J90 Pleural effusion, not elsewhere classified: Secondary | ICD-10-CM

## 2016-04-06 DIAGNOSIS — J948 Other specified pleural conditions: Secondary | ICD-10-CM

## 2016-04-06 NOTE — Progress Notes (Signed)
HPI:  The patient returns today for follow up a persistent right pleural effusion treated with a PleurX catheter. The catheter has continued to drain 350-400cc every three days for the last two weeks.  The left catheter was removed on 5/11. He is feeling well. His sister has noticed that he has a slow heart rate in the 40's in the evening.  Current Outpatient Prescriptions  Medication Sig Dispense Refill  . aspirin 81 MG EC tablet Take 1 tablet (81 mg total) by mouth daily. 30 tablet 1  . atorvastatin (LIPITOR) 80 MG tablet Take 1 tablet (80 mg total) by mouth daily. 90 tablet 3  . budesonide-formoterol (SYMBICORT) 160-4.5 MCG/ACT inhaler Inhale 2 puffs into the lungs 2 (two) times daily as needed.    . digoxin (LANOXIN) 0.125 MG tablet Take 1 tablet (0.125 mg total) by mouth daily. 90 tablet 1  . famotidine (PEPCID) 20 MG tablet Take 1 tablet (20 mg total) by mouth daily. 30 tablet 0  . furosemide (LASIX) 20 MG tablet Take 1 tablet (20 mg total) by mouth daily. 90 tablet 1  . metoprolol tartrate (LOPRESSOR) 25 MG tablet Take 1 tablet (25 mg total) by mouth 2 (two) times daily. 180 tablet 3  . sodium chloride (OCEAN) 0.65 % SOLN nasal spray Place 1 spray into both nostrils as needed for congestion.    Marland Kitchen tiotropium (SPIRIVA) 18 MCG inhalation capsule Place 18 mcg into inhaler and inhale daily as needed.    . traMADol (ULTRAM) 50 MG tablet Take 2 tablets (100 mg total) by mouth every 6 (six) hours as needed. 40 tablet 0  . warfarin (COUMADIN) 2.5 MG tablet Take 1 tab daily except 1/2 tab on Fri or as directed by [facility] 35 tablet 1   No current facility-administered medications for this visit.     Physical Exam: BP 100/60 mmHg  Pulse 48  Resp 16  Ht  (1.753 m)  Wt 118 lb (53.524 kg)  BMI 17.42 kg/m2  SpO2 95% He looks thin but well  Lungs are clear The left PleurX catheter site looks fine.  Diagnostic Tests:  CLINICAL DATA: Status post CABG in March 2017,  follow-up study, no current complaints.  EXAM: CHEST 2 VIEW  COMPARISON: PA and lateral chest of Mar 30, 2016  FINDINGS: The lungs remain hyperinflated. Small amounts of pleural fluid or pleural thickening persists in the left lateral and posterior costophrenic angles. A small caliber pleural catheter is coiled on the right over the midportion of the hemidiaphragm with the tip of the catheter projecting over the medial aspect of the right ninth rib. This is stable. Multiple old rib deformities are present laterally, bilaterally. The heart is normal in size. The pulmonary vascularity is not engorged. The sternal wires are intact. The retrosternal soft tissues are normal. There is a prosthetic valve in the aortic position.  IMPRESSION: No significant residual pleural effusion on the right. Trace of pleural fluid versus pleural thickening on the left. Underlying COPD. No CHF.   Electronically Signed  By: David Swaziland M.D.  On: 04/06/2016 15:36   Impression:  His CXR looks good today but he just drained 400 cc from the catheter before he came. The catheter continues to drain this amount every three days. I think the best course of action is to not drain the catheter for the next week and repeat the CXR at the next visit in one week. If the right effusion is minimal then the  catheter could be removed. If the effusion recurs then a pleurodesis is probably indicated.  Plan:  Return in one week with a CXR   Alleen BorneBryan K Lashya Passe, MD [facility] [phone]

## 2016-04-11 ENCOUNTER — Ambulatory Visit (INDEPENDENT_AMBULATORY_CARE_PROVIDER_SITE_OTHER): Payer: Medicaid Other | Admitting: *Deleted

## 2016-04-11 DIAGNOSIS — Z5181 Encounter for therapeutic drug level monitoring: Secondary | ICD-10-CM | POA: Diagnosis not present

## 2016-04-11 DIAGNOSIS — I480 Paroxysmal atrial fibrillation: Secondary | ICD-10-CM

## 2016-04-11 LAB — POCT INR: INR: 1.9

## 2016-04-12 ENCOUNTER — Other Ambulatory Visit: Payer: Self-pay | Admitting: Surgery

## 2016-04-12 DIAGNOSIS — Z951 Presence of aortocoronary bypass graft: Secondary | ICD-10-CM

## 2016-04-13 ENCOUNTER — Ambulatory Visit
Admission: RE | Admit: 2016-04-13 | Discharge: 2016-04-13 | Disposition: A | Discharge status: Home / Self Care | Payer: Medicaid Other | Source: Ambulatory Visit | Attending: Surgery | Admitting: Surgery

## 2016-04-13 ENCOUNTER — Ambulatory Visit (INDEPENDENT_AMBULATORY_CARE_PROVIDER_SITE_OTHER): Payer: Medicaid Other | Admitting: Surgery

## 2016-04-13 ENCOUNTER — Encounter: Payer: Self-pay | Admitting: Surgery

## 2016-04-13 ENCOUNTER — Other Ambulatory Visit: Payer: Self-pay | Admitting: *Deleted

## 2016-04-13 VITALS — BP 98/66 | HR 60 | Resp 20 | Ht 69.0 in | Wt 119.0 lb

## 2016-04-13 DIAGNOSIS — Z8679 Personal history of other diseases of the circulatory system: Secondary | ICD-10-CM

## 2016-04-13 DIAGNOSIS — Z954 Presence of other heart-valve replacement: Secondary | ICD-10-CM

## 2016-04-13 DIAGNOSIS — I712 Thoracic aortic aneurysm, without rupture, unspecified: Secondary | ICD-10-CM

## 2016-04-13 DIAGNOSIS — I2510 Atherosclerotic heart disease of native coronary artery without angina pectoris: Secondary | ICD-10-CM

## 2016-04-13 DIAGNOSIS — Z951 Presence of aortocoronary bypass graft: Secondary | ICD-10-CM | POA: Diagnosis not present

## 2016-04-13 DIAGNOSIS — J948 Other specified pleural conditions: Secondary | ICD-10-CM | POA: Diagnosis not present

## 2016-04-13 DIAGNOSIS — J90 Pleural effusion, not elsewhere classified: Secondary | ICD-10-CM | POA: Diagnosis not present

## 2016-04-13 DIAGNOSIS — I350 Nonrheumatic aortic (valve) stenosis: Secondary | ICD-10-CM

## 2016-04-13 DIAGNOSIS — Z952 Presence of prosthetic heart valve: Secondary | ICD-10-CM

## 2016-04-13 DIAGNOSIS — Z9889 Other specified postprocedural states: Secondary | ICD-10-CM

## 2016-04-15 ENCOUNTER — Ambulatory Visit (HOSPITAL_COMMUNITY)
Admission: RE | Admit: 2016-04-15 | Discharge: 2016-04-15 | Disposition: A | Discharge status: Home / Self Care | Payer: Medicaid Other | Source: Ambulatory Visit | Attending: Surgery | Admitting: Surgery

## 2016-04-15 ENCOUNTER — Encounter (HOSPITAL_COMMUNITY)
Admission: RE | Disposition: A | Discharge status: Home / Self Care | Payer: Self-pay | Source: Ambulatory Visit | Attending: Surgery

## 2016-04-15 DIAGNOSIS — Z4589 Encounter for adjustment and management of other implanted devices: Secondary | ICD-10-CM | POA: Insufficient documentation

## 2016-04-15 DIAGNOSIS — J90 Pleural effusion, not elsewhere classified: Secondary | ICD-10-CM | POA: Diagnosis not present

## 2016-04-15 DIAGNOSIS — Z7951 Long term (current) use of inhaled steroids: Secondary | ICD-10-CM | POA: Diagnosis not present

## 2016-04-15 DIAGNOSIS — Z7982 Long term (current) use of aspirin: Secondary | ICD-10-CM | POA: Diagnosis not present

## 2016-04-15 DIAGNOSIS — Z79899 Other long term (current) drug therapy: Secondary | ICD-10-CM | POA: Insufficient documentation

## 2016-04-15 SURGERY — REMOVAL, CLOSED DRAINAGE CATHETER SYSTEM, PLEURAL
Anesthesia: Monitor Anesthesia Care

## 2016-04-15 MED ORDER — LIDOCAINE HCL (PF) 1 % IJ SOLN
INTRAMUSCULAR | Status: AC
  Filled 2016-04-15: qty 30

## 2016-04-15 SURGICAL SUPPLY — 27 items
ADH SKN CLS APL DERMABOND .7 (GAUZE/BANDAGES/DRESSINGS)
BRUSH SCRUB EZ PLAIN DRY (MISCELLANEOUS) ×4 IMPLANT
CANISTER SUCTION 2500CC (MISCELLANEOUS) ×4 IMPLANT
COVER SURGICAL LIGHT HANDLE (MISCELLANEOUS) ×3 IMPLANT
DERMABOND ADVANCED (GAUZE/BANDAGES/DRESSINGS)
DERMABOND ADVANCED .7 DNX12 (GAUZE/BANDAGES/DRESSINGS) ×1 IMPLANT
DRAPE C-ARM 42X72 X-RAY (DRAPES) ×4 IMPLANT
DRAPE LAPAROSCOPIC ABDOMINAL (DRAPES) ×4 IMPLANT
GLOVE EUDERMIC 7 POWDERFREE (GLOVE) ×4 IMPLANT
GOWN STRL REUS W/ TWL LRG LVL3 (GOWN DISPOSABLE) ×2 IMPLANT
GOWN STRL REUS W/ TWL XL LVL3 (GOWN DISPOSABLE) ×2 IMPLANT
GOWN STRL REUS W/TWL LRG LVL3 (GOWN DISPOSABLE) ×3
GOWN STRL REUS W/TWL XL LVL3 (GOWN DISPOSABLE) ×3
KIT BASIN OR (CUSTOM PROCEDURE TRAY) ×4 IMPLANT
KIT PLEURX DRAIN CATH 1000ML (MISCELLANEOUS) ×1 IMPLANT
KIT PLEURX DRAIN CATH 15.5FR (DRAIN) ×1 IMPLANT
KIT ROOM TURNOVER OR (KITS) ×4 IMPLANT
NS IRRIG 1000ML POUR BTL (IV SOLUTION) ×3 IMPLANT
PACK GENERAL/GYN (CUSTOM PROCEDURE TRAY) ×1 IMPLANT
PAD ARMBOARD 7.5X6 YLW CONV (MISCELLANEOUS) ×6 IMPLANT
SET DRAINAGE LINE (MISCELLANEOUS) IMPLANT
SUT ETHILON 3 0 PS 1 (SUTURE) ×1 IMPLANT
SUT VIC AB 3-0 X1 27 (SUTURE) IMPLANT
TOWEL OR 17X24 6PK STRL BLUE (TOWEL DISPOSABLE) ×3 IMPLANT
TOWEL OR 17X26 10 PK STRL BLUE (TOWEL DISPOSABLE) ×4 IMPLANT
VALVE REPLACEMENT CAP (MISCELLANEOUS) IMPLANT
WATER STERILE IRR 1000ML POUR (IV SOLUTION) ×4 IMPLANT

## 2016-04-15 NOTE — Discharge Instructions (Signed)
Written discharge instructions reviewed and given to patient and his sister.

## 2016-04-15 NOTE — Progress Notes (Signed)
      [address]       [place] [id number]             [phone]     BRIEF PROCEDURE:    RIGHT PLEURX CATHETER REMOVAL USING USUAL TECHNIQUE  8 CC 1%LOCAL LIDOCAINE  CATHETER REMOVED INTACT  #1 3-0  NYLON SUTURE PLACED  PATIENT TOLERATED PROCEDURE WELL   GOLD,WAYNE E, PA-C

## 2016-04-16 ENCOUNTER — Encounter: Payer: Self-pay | Admitting: Surgery

## 2016-04-16 NOTE — Progress Notes (Signed)
     HPI:  The patient returns today for follow up a persistent right pleural effusion treated with a PleurX catheter. Over the past week he has not drained the catheter and returns today to see what his CXR looks like. His breathing has been fine.  Current Outpatient Prescriptions  Medication Sig Dispense Refill  . aspirin 81 MG EC tablet Take 1 tablet (81 mg total) by mouth daily. 30 tablet 1  . atorvastatin (LIPITOR) 80 MG tablet Take 1 tablet (80 mg total) by mouth daily. 90 tablet 3  . budesonide-formoterol (SYMBICORT) 160-4.5 MCG/ACT inhaler Inhale 2 puffs into the lungs 2 (two) times daily as needed (For shortness of breath.).     Marland Kitchen. famotidine (PEPCID) 20 MG tablet Take 1 tablet (20 mg total) by mouth daily. 30 tablet 0  . furosemide (LASIX) 20 MG tablet Take 1 tablet (20 mg total) by mouth daily. 90 tablet 1  . metoprolol tartrate (LOPRESSOR) 25 MG tablet Take 1 tablet (25 mg total) by mouth 2 (two) times daily. 180 tablet 3  . sodium chloride (OCEAN) 0.65 % SOLN nasal spray Place 1 spray into both nostrils as needed for congestion.    Marland Kitchen. tiotropium (SPIRIVA) 18 MCG inhalation capsule Place 18 mcg into inhaler and inhale daily as needed (For shortness of breath.).     Marland Kitchen. traMADol (ULTRAM) 50 MG tablet Take 2 tablets (100 mg total) by mouth every 6 (six) hours as needed. (Patient taking differently: Take 100 mg by mouth every 6 (six) hours as needed (For pain.). ) 40 tablet 0  . warfarin (COUMADIN) 2.5 MG tablet Take 1 tab daily except 1/2 tab on Fri or as directed by [facility] (Patient taking differently: Take 2.5 mg by mouth daily. ) 35 tablet 1   No current facility-administered medications for this visit.     Physical Exam: BP 98/66 mmHg  Pulse 60  Resp 20  Ht 5\' 9"  (1.753 m)  Wt 119 lb (53.978 kg)  BMI 17.57 kg/m2  SpO2 93% He looks thin but well  Lungs are clear  Diagnostic Tests:  CLINICAL DATA: History of CABG in March of 2017 with recurrent pleural  effusion, followup  EXAM: CHEST 2 VIEW  COMPARISON: Chest x-ray of 04/06/2016  FINDINGS: There appears to be a PleurX catheter at the right lung base. No definite right pleural effusion is seen. There is persistent blunting of the left costophrenic angle which may represent a small left effusion and possibly mild left pleural thickening. The lungs remain somewhat hyperaerated with chronic interstitial changes noted. Mediastinal and hilar contours are stable and mild cardiomegaly is stable. Median sternotomy sutures are noted from prior CABG and aortic valve replacement. Old bilateral rib fractures are noted. No acute bony abnormality is seen.  IMPRESSION: 1. Apparent PleurX catheter remains at the right lung base. No definite right pleural effusion is seen. 2. Stable small left pleural effusion versus pleural thickening.   Electronically Signed  By: Dwyane DeePaul Barry M.D.  On: 04/13/2016 09:42  Impression:  The catheter has not been drained in a week and there is no significant pleural effusion. I think the catheter can be removed.  Plan:  We will schedule removal of the catheter in short stay ASAP. Then I will see him back in one month with a CXR.   Alleen BorneBryan K Bartle, MD [facility] [phone]

## 2016-04-18 ENCOUNTER — Ambulatory Visit (INDEPENDENT_AMBULATORY_CARE_PROVIDER_SITE_OTHER): Payer: Medicaid Other | Admitting: *Deleted

## 2016-04-18 DIAGNOSIS — I480 Paroxysmal atrial fibrillation: Secondary | ICD-10-CM | POA: Diagnosis not present

## 2016-04-18 DIAGNOSIS — I4891 Unspecified atrial fibrillation: Secondary | ICD-10-CM | POA: Diagnosis not present

## 2016-04-18 DIAGNOSIS — Z5181 Encounter for therapeutic drug level monitoring: Secondary | ICD-10-CM | POA: Diagnosis not present

## 2016-04-18 LAB — POCT INR: INR: 1.4

## 2016-04-19 NOTE — H&P (Signed)
Expand All Collapse All        [address]  [place] [id number]  [phone]   Cardiothoracic Surgery History and Physical   HPI:  The patient presents for removal of his right PleurX catheter. It has not been drained in a week and there is no significant right pleural effusion on CXR.  Current Outpatient Prescriptions  Medication Sig Dispense Refill  . aspirin 81 MG EC tablet Take 1 tablet (81 mg total) by mouth daily. 30 tablet 1  . atorvastatin (LIPITOR) 80 MG tablet Take 1 tablet (80 mg total) by mouth daily. 90 tablet 3  . budesonide-formoterol (SYMBICORT) 160-4.5 MCG/ACT inhaler Inhale 2 puffs into the lungs 2 (two) times daily. 1 Inhaler 2  . digoxin (LANOXIN) 0.125 MG tablet Take 1 tablet (0.125 mg total) by mouth daily. 90 tablet 1  . famotidine (PEPCID) 20 MG tablet Take 1 tablet (20 mg total) by mouth daily. 30 tablet 0  . furosemide (LASIX) 20 MG tablet Take 1 tablet (20 mg total) by mouth daily. 90 tablet 1  . metoprolol tartrate (LOPRESSOR) 25 MG tablet Take 1 tablet (25 mg total) by mouth 2 (two) times daily. 180 tablet 3  . tiotropium (SPIRIVA) 18 MCG inhalation capsule Place 1 capsule (18 mcg total) into inhaler and inhale daily. 30 capsule 2  . traMADol (ULTRAM) 50 MG tablet Take 2 tablets (100 mg total) by mouth every 6 (six) hours as needed. 40 tablet 0  . warfarin (COUMADIN) 2.5 MG tablet Take 1 tab daily except 1/2 tab on Fri or as directed by [facility] 35 tablet 1  . guaiFENesin (MUCINEX) 600 MG 12 hr tablet Take 2 tablets (1,200 mg total) by mouth 2 (two) times daily. For cough (Patient taking differently: Take 1,200 mg by mouth daily. For cough) 100 tablet 0   No current facility-administered medications for this visit.     Physical Exam: BP 102/63 mmHg  Pulse  68  Resp 16  Ht 5\' 9"  (1.753 m)  Wt 118 lb 9.6 oz (53.797 kg)  BMI 17.51 kg/m2  SpO2 93% He looks well Lungs are clear Cardiac exam shows an irregularly irregular rhythm with normal valve sounds. There is no peripheral edema  Diagnostic Tests:  CLINICAL DATA: Status post coronary artery bypass graft. Shortness of breath.  EXAM: CHEST 2 VIEW  COMPARISON: February 25, 2016.  FINDINGS: The heart size and mediastinal contours are within normal limits. No pneumothorax is noted. Sternotomy wires are noted. Status post cardiac valve repair. Old bilateral rib fractures are noted. Stable mild bilateral pleural effusions are noted. Bibasilar opacities are noted most consistent with subsegmental atelectasis or scarring.  IMPRESSION: Stable bibasilar opacities are noted concerning for scarring or subsegmental atelectasis with mild bilateral pleural effusions.   Electronically Signed  By: Lupita RaiderJames Green Jr, M.D.  On: 03/16/2016 09:31   Impression:  There is no reaccumulation of the right pleural effusion and it is time to remove the right PleurX catheter.  Plan:  Right pleurX catheter removal in short stay.     Alleen BorneBryan K Arianna Haydon, MD [facility] [phone]

## 2016-04-22 ENCOUNTER — Telehealth: Payer: Self-pay | Admitting: Cardiovascular Disease

## 2016-04-22 ENCOUNTER — Other Ambulatory Visit: Payer: Self-pay | Admitting: *Deleted

## 2016-04-22 ENCOUNTER — Ambulatory Visit
Admission: RE | Admit: 2016-04-22 | Discharge: 2016-04-22 | Disposition: A | Discharge status: Home / Self Care | Payer: Medicaid Other | Source: Ambulatory Visit | Attending: Surgery | Admitting: Surgery

## 2016-04-22 ENCOUNTER — Ambulatory Visit (INDEPENDENT_AMBULATORY_CARE_PROVIDER_SITE_OTHER): Payer: Medicaid Other

## 2016-04-22 DIAGNOSIS — I25810 Atherosclerosis of coronary artery bypass graft(s) without angina pectoris: Secondary | ICD-10-CM

## 2016-04-22 DIAGNOSIS — J90 Pleural effusion, not elsewhere classified: Secondary | ICD-10-CM

## 2016-04-22 DIAGNOSIS — Z4802 Encounter for removal of sutures: Secondary | ICD-10-CM

## 2016-04-22 NOTE — Telephone Encounter (Signed)
SPOKE WITH  PT'S DAUGHTER  RE:,MESSAGE   PER  PT  HAS NOT CHANGED  DIET   SWELLING  IS  WORSE IN   LEFT  FOOT  ,AND  HAS  SOB  THAT  AWAKENS HIM  IN EARLY AM the patient   MAY TAKE   INHALER  WITH  RELIEF  SOME OF  THE TIME  BUT NOT  ALWAYS. HAD  CXR TODAY  AFTER  HAVING  PLEURAL CATH  REMOVED  CXR  OKAY  the patient  IS  ONLY TAKING  FUROSEMIDE   20 MG   EVERY DAY .  DISCUSSED  WITH DR ALLRED PER  DR  ALLRED   HAVE  PT  INCREASE  FUROSEMIDE  20 MG  BID  FOR  3  DAYS  THEN  RESUME  TO  1  DAILY   AND  FORWARD  TO  DR  Clifton JamesMCALHANY

## 2016-04-22 NOTE — Progress Notes (Unsigned)
Craig [name] returns for suture removal from his previous R pleurX removal site. This was easily done and the area is very well healed. He relates continued shortness of breath particularly in the morning and feels like it did before.  Breath sounds are diminished of the left side.  I had him get a chest xray and it was consistent with the one on 04/13/16.  He is c/o edema in his left ankle. I said he should see his PCP or cardiologist for medication and management and he agreed. He will return as scheduled on 05/11/16 with repeat CXR.

## 2016-04-22 NOTE — Telephone Encounter (Signed)
New message      Pt c/o swelling: STAT is pt has developed SOB within 24 hours  1. How long have you been experiencing swelling? For 3 days  2. Where is the swelling located? lft foot worse than right 3.  Are you currently taking a "fluid pill"?yes----low dose lasix 4.  Are you currently SOB? Little sob and pt is not peeing a lot. Pt has gained about 3 lbs since hosp discharge 5.  Have you traveled recently?no

## 2016-04-25 ENCOUNTER — Ambulatory Visit (INDEPENDENT_AMBULATORY_CARE_PROVIDER_SITE_OTHER): Payer: Medicaid Other | Admitting: *Deleted

## 2016-04-25 ENCOUNTER — Telehealth: Payer: Self-pay

## 2016-04-25 DIAGNOSIS — Z5181 Encounter for therapeutic drug level monitoring: Secondary | ICD-10-CM | POA: Diagnosis not present

## 2016-04-25 DIAGNOSIS — I480 Paroxysmal atrial fibrillation: Secondary | ICD-10-CM | POA: Diagnosis not present

## 2016-04-25 LAB — POCT INR: INR: 1.4

## 2016-04-25 NOTE — Telephone Encounter (Signed)
Pt's sister, Lupita LeashDonna states LE edema has improved some but pt complains of shortness of breath.  Lupita LeashDonna states pt does not weigh daily but she feels his weight is up.  Lupita LeashDonna offered appt for pt today at 10AM, but declined due to short notice of appt time.  Pt scheduled to see Nada BoozerLaura Ingold, NP 04/27/16 at 11:30AM, Lupita Leashonna aware of appt time.

## 2016-04-25 NOTE — Telephone Encounter (Signed)
Prior auth for Symbicort160-4.5 mcg Inhaler obtained from [employer]. PA # [id number], and is good through 04/15/2018.

## 2016-04-25 NOTE — Telephone Encounter (Signed)
Can we check on him today to see if the increased Lasix over the last 3 days has helped his swelling. If he has not had resolution of symptoms, he may need to be seen by an office APP this week. Thanks, Thayer Ohmhris

## 2016-04-27 ENCOUNTER — Encounter: Payer: Self-pay | Admitting: Cardiology

## 2016-04-27 ENCOUNTER — Ambulatory Visit (INDEPENDENT_AMBULATORY_CARE_PROVIDER_SITE_OTHER): Payer: Medicaid Other | Admitting: Cardiology

## 2016-04-27 VITALS — BP 120/80 | HR 80 | Ht 69.0 in | Wt 119.0 lb

## 2016-04-27 DIAGNOSIS — I481 Persistent atrial fibrillation: Secondary | ICD-10-CM | POA: Diagnosis not present

## 2016-04-27 DIAGNOSIS — I509 Heart failure, unspecified: Secondary | ICD-10-CM | POA: Diagnosis not present

## 2016-04-27 DIAGNOSIS — Z7901 Long term (current) use of anticoagulants: Secondary | ICD-10-CM

## 2016-04-27 DIAGNOSIS — E875 Hyperkalemia: Secondary | ICD-10-CM

## 2016-04-27 DIAGNOSIS — I25810 Atherosclerosis of coronary artery bypass graft(s) without angina pectoris: Secondary | ICD-10-CM

## 2016-04-27 DIAGNOSIS — Z5181 Encounter for therapeutic drug level monitoring: Secondary | ICD-10-CM

## 2016-04-27 DIAGNOSIS — I4819 Other persistent atrial fibrillation: Secondary | ICD-10-CM

## 2016-04-27 DIAGNOSIS — Z952 Presence of prosthetic heart valve: Secondary | ICD-10-CM

## 2016-04-27 DIAGNOSIS — Z954 Presence of other heart-valve replacement: Secondary | ICD-10-CM

## 2016-04-27 LAB — BASIC METABOLIC PANEL
BUN: 19 mg/dL (ref 7–25)
CO2: 29 mmol/L (ref 20–31)
CREATININE: 1.03 mg/dL (ref 0.70–1.25)
Calcium: 9.2 mg/dL (ref 8.6–10.3)
Chloride: 104 mmol/L (ref 98–110)
Glucose, Bld: 108 mg/dL — ABNORMAL HIGH (ref 65–99)
POTASSIUM: 4.3 mmol/L (ref 3.5–5.3)
Sodium: 145 mmol/L (ref 135–146)

## 2016-04-27 MED ORDER — FUROSEMIDE 40 MG PO TABS: 40.0000 mg | ORAL_TABLET | Freq: Every day | ORAL | Status: DC

## 2016-04-27 MED ORDER — APIXABAN 5 MG PO TABS: 5.0000 mg | ORAL_TABLET | Freq: Two times a day (BID) | ORAL | Status: DC

## 2016-04-27 NOTE — Progress Notes (Signed)
Cardiology Office Note   Date:  04/27/2016   ID:  Craig FeinsteinRobert S Bale, DOB 07/08/1951, MRN [id number]  PCP:  Rose FillersEDENFIELD, GEORGE D, PA-C  Cardiologist:  Dr. Clifton JamesMcAlhany    Chief Complaint  Patient presents with  . Shortness of Breath      History of Present Illness: Craig [name] is a 65 y.o. male who presents for increasing SOB and edema.   He has past medical history of COPD, HTN, HLD, history of alcohol abuse quit in 2004 and tobacco use.  Echo on 12/28/2015 did show EF 35-40% with wall motion abnormality, severe AS, mild MR, 42 mm aortic root, akinetic RV apex. He eventually underwent cardiac catheterization on 12/29/2015 which showed three-vessel CAD with 50% ostial left main. Cardiothoracic surgery was consulted who recommended CABG with aortic root replacement and also aortic valve replacement for right pneumonia resolved. Presurgical eval showed severe COPD, patient understood the high risk and willing to proceed with surgery despite the risk. His hospitalization was also complicated by occurrence of episodes of PAF, he was started on amiodarone loading prior to surgery.  CABG 4 with LIMA to LAD, SVG to OM, seq SVG to PDA/PLA, aortic valve replacement using 25 mm Edwards Magna-ease pericardial valve, supra-coronary ascending aortic and hemi-arch replacement using a 30 mm Hemashield.  Pt developed pl effusions post op requiring Pleurx caths bil.  Both have now been removed.  .   During hospitalization he was started on Coumadin in the hospital and amiodarone stopped. He is also on aspirin as well. Today he is seen for increasing edema., SOB.  Over the weekend we increased his lasix to 40 mg daily and he improved but now back on the 20 mg his SOB has returned.  Recent CXR was stable.  His lower ext edema is returning.  No chest pain and otherwise he had been doing well.  He continues in a fib which may be causing the edema and SOB.   Past Medical History  Diagnosis Date  . COPD  (chronic obstructive pulmonary disease) (HCC)     severe on PFT prior to CABG  . Hypertension   . Hyperlipidemia   . Alcohol abuse     Prior  . Tobacco abuse     Prior  . CAD (coronary artery disease) of bypass graft      4v CABG (LIMA to LAD, SVG to OM, seq SVG to PDA/PLA) by Dr. Laneta SimmersBartle 01/07/2016  . dilated ascending aorta     s/p supra-coronary ascending aortic and hemi-arch replacement using a 30 mm Hemashield under deep hypothermic circulatory arrest during CABG 01/07/2016  . Severe aortic stenosis     s/p aortic valve replacement using 25 mm Edwards Magna-ease pericardial valve by Dr. Laneta SimmersBartle during CABG 01/07/2016  . Persistent atrial fibrillation (HCC)     in the setting of [facility]STEMI and [facility]  . Recurrent left pleural effusion     s/p PleurX drain, occured after CABG  . Recurrent right pleural effusion     s/p PleurX drain, occured after CABG    Past Surgical History  Procedure Laterality Date  . Ruptured disc repair  2009 or 2010  . Cardiac catheterization N/A 12/29/2015    Procedure: Right/Left Heart Cath and Coronary Angiography;  Surgeon: Peter M SwazilandJordan, MD;  [facility];  Service: Cardiovascular;  Laterality: N/A;  . Coronary artery bypass graft N/A 01/07/2016    Procedure: CORONARY ARTERY BYPASS GRAFTING (CABG), ON PUMP, TIMES FOUR,  USING LEFT INTERNAL MAMMARY ARTERY, BILATERAL GREATER SAPHENOUS VEINS HARVESTED ENDOSCOPICALLY;  Surgeon: Alleen Borne, MD;  Location: [facility] OR;  Service: Open Heart Surgery;  Laterality: N/A;  LIMA to LAD, SVG to OM, SVG SEQUENTIALLY to PDA and PLB  . Tee without cardioversion N/A 01/07/2016    Procedure: TRANSESOPHAGEAL ECHOCARDIOGRAM (TEE);  Surgeon: Alleen Borne, MD;  Location: [facility] OR;  Service: Open Heart Surgery;  Laterality: N/A;  . Thoracic aortic aneurysm repair N/A 01/07/2016    Procedure: CIRC ARREST AND REPLACEMENT OF ASCENDING THORACIC  ANEURYSM;  Surgeon: Alleen Borne, MD;  Location: [facility] OR;  Service:  Open Heart Surgery;  Laterality: N/A;  . Chest tube insertion Bilateral 01/25/2016    Procedure: INSERTION Bilateral PLEURAL DRAINAGE CATHETER;  Surgeon: Alleen Borne, MD;  Location: [facility] OR;  Service: Thoracic;  Laterality: Bilateral;  . Removal of pleural drainage catheter Left 03/17/2016    Procedure: REMOVAL OF PLEURAL DRAINAGE CATHETER;  Surgeon: Alleen Borne, MD;  Location: [facility] OR;  Service: Thoracic;  Laterality: Left;     Current Outpatient Prescriptions  Medication Sig Dispense Refill  . aspirin 81 MG EC tablet Take 1 tablet (81 mg total) by mouth daily. 30 tablet 1  . atorvastatin (LIPITOR) 80 MG tablet Take 1 tablet (80 mg total) by mouth daily. 90 tablet 3  . budesonide-formoterol (SYMBICORT) 160-4.5 MCG/ACT inhaler Inhale 2 puffs into the lungs 2 (two) times daily as needed (For shortness of breath.).     Marland Kitchen famotidine (PEPCID) 20 MG tablet Take 1 tablet (20 mg total) by mouth daily. 30 tablet 0  . metoprolol tartrate (LOPRESSOR) 25 MG tablet Take 1 tablet (25 mg total) by mouth 2 (two) times daily. 180 tablet 3  . sodium chloride (OCEAN) 0.65 % SOLN nasal spray Place 1 spray into both nostrils as needed for congestion.    Marland Kitchen tiotropium (SPIRIVA) 18 MCG inhalation capsule Place 18 mcg into inhaler and inhale daily as needed (For shortness of breath.).     Marland Kitchen apixaban (ELIQUIS) 5 MG TABS tablet Take 1 tablet (5 mg total) by mouth 2 (two) times daily. 60 tablet 5  . furosemide (LASIX) 40 MG tablet Take 1 tablet (40 mg total) by mouth daily. 90 tablet 3   No current facility-administered medications for this visit.    Allergies:   Review of patient's allergies indicates no known allergies.    Social History:  The patient  reports that he has quit smoking. He has never used smokeless tobacco. He reports that he does not drink alcohol or use illicit drugs.   Family History:  The patient's family history includes Diabetes in his father; Kidney disease in his brother, father, and  sister; Stroke in his father and mother. There is no history of Heart attack.    ROS:  General:no colds or fevers, no weight changes Skin:no rashes or ulcers HEENT:no blurred vision, no congestion CV:see HPI PUL:see HPI GI:no diarrhea constipation or melena, no indigestion GU:no hematuria, no dysuria MS:no joint pain, no claudication Neuro:no syncope, no lightheadedness Endo:no diabetes, no thyroid disease  Wt Readings from Last 3 Encounters:  04/27/16 119 lb (53.978 kg)  04/15/16 120 lb (54.432 kg)  04/13/16 119 lb (53.978 kg)     PHYSICAL EXAM: VS:  BP 120/80 mmHg  Pulse 80  Ht  (1.753 m)  Wt 119 lb (53.978 kg)  BMI 17.57 kg/m2  SpO2 93% , BMI Body mass index is 17.57 kg/(m^2). General:Pleasant affect, NAD Skin:Warm and  dry, brisk capillary refill HEENT:normocephalic, sclera clear, mucus membranes moist Neck:supple, no JVD, no bruits  Heart: irreg irreg without murmur, gallup, rub or click Lungs: with  Rales and  rhonchi, no wheezes UJW:JXBJ, non tender, + BS, do not palpate liver spleen or masses Ext:tr lower ext edema, 2+ radial pulses Neuro:alert and oriented X 3, MAE, follows commands, + facial symmetry    EKG:  EKG is not ordered today.    Recent Labs: 12/27/2015: B Natriuretic Peptide 1306.4* 01/04/2016: TSH 2.395 02/10/2016: Magnesium 1.7 02/26/2016: ALT 18 02/27/2016: Hemoglobin 12.5*; Platelets 229 03/16/2016: BUN 16; Creat 1.07; Potassium 4.4; Sodium 138    Lipid Panel    Component Value Date/Time   CHOL 168 02/26/2016 1201   TRIG 114 02/26/2016 1201   HDL 39* 02/26/2016 1201   CHOLHDL 4.3 02/26/2016 1201   VLDL 23 02/26/2016 1201   LDLCALC 106 02/26/2016 1201       Other studies Reviewed: Additional studies/ records that were reviewed today include: . CABG x4/AVR/Aortic root replacement 01/07/2016 by Dr. Laneta Simmers Procedure:   Median Sternotomy  Extracorporeal circulation 3. Coronary artery bypass graft surgery x 4 4. Endoscopic  vein harvest from both legs. 5. Aortic valve replacement using a 25 mm Edwards Magna-ease pericardial valve. 6. Supra-coronary ascending aortic and hemi-arch replacement using a 30 mm Hemashield graft under deep hypothermic circulatory arrest. 7. Right axillary artery 8 mm Hemashield graft for arterial cannulation and antegrade cerebral perfusion . 8. Insertion of left femoral arterial line.    Cath 12/29/2015 Conclusion     Ost RCA to Prox RCA lesion, 90% stenosed.  Mid RCA lesion, 45% stenosed.  Ost LM lesion, 50% stenosed.  Ost LAD to Mid LAD lesion, 50% stenosed.  Dist LAD lesion, 70% stenosed.  Ost Cx to Prox Cx lesion, 70% stenosed.  1. 3 vessel obstructive CAD  - 50% ostial left main  - diffuse 50% proximal to mid LAD, 70% distal LAD- heavily calcified.  - 70% proximal LCx  -90% proximal RCA 2. Moderate pulmonary HTN with elevated LV filling pressures.  Plan: will need to consider for CABG and AVR. Plavix held.     Echo 01/27/2016 LV EF: 45% - 50%  ------------------------------------------------------------------- Indications: Atrial fibrillation - 427.31. Pericardial effusion 423.9.  ------------------------------------------------------------------- Study Conclusions  - Left ventricle: The cavity size was normal. Systolic function was  mildly reduced. The estimated ejection fraction was in the range  of 45% to 50%. Images were inadequate for LV wall motion  assessment. - Ventricular septum: Septal motion showed paradox. - Left atrium: The atrium was severely dilated. - Right ventricle: The cavity size was mildly dilated. Systolic  function was reduced. - Right atrium: The atrium was mildly dilated. - Pericardium, extracardiac: A small pericardial effusion was  identified posterior to the heart. There was no evidence of  hemodynamic compromise.               ASSESSMENT AND PLAN:  1. Acute on chronic  combined systolic and diastolic HF: most likely due to a fib.  He took higher dose of lasix over the weekend and improved but back at 20 mg his SOB and edema returned.  His CXR was stable.   Will place pt on lasix 40 mg daily, will check BMP today.  He has had hyperkalemia in the past.    2. Persistent atrial fibrillation: started during perioperative phase of CABG, previous EKG reviewed, he actually arrive in the hospital in NSR, prior to CABG, he had intermittent  afib and was placed on amiodarone. However it seems his afib became persistent after CABG, however it is rate controlled on metoprolol and digoxin. No amiodarone with liver shock.  He Has been anticoagulated with coumadin- CHA2DS2-Vasc score 3 (HF, HTN, CAD),  - He did have a episode of nosebleed and was fixed in the ED.  -his INR has not been therapeutic discussed with Dr. Johney Frame - will change the coumadin to Eliquis 5 mg BID  and then plan cardioversion in 3 weeks- will have him see Dr. Clifton James at that time.  Please note reviewed nonvalvular use of Eliquis with Dr. Graciela Husbands and this is not mitral valuvular issues.  We will proceed with Eliquis.   - his Lt atrium was severly dilated and Dr. Johney Frame felt we should try DCCV.   He had been on dig for rate control but Dr. Laneta Simmers has stopped. rate is well controlled today.  Pt is on BB and he does have COPD, may need to adjust this in future.    3. CAD s/p 4v CABG (LIMA to LAD, SVG to OM, seq SVG to PDA/PLA) by Dr. Laneta Simmers 01/07/2016: no angina  3. Dilated ascending aorta s/p supra-coronary ascending aortic and hemi-arch replacement using a 30 mm Hemashield under deep hypothermic circulatory arrest during CABG 01/07/2016  4. Severe AS s/p aortic valve replacement using 25 mm Edwards Magna-ease pericardial valve: stable  6. HTN: Blood pressure 120/80 today.  continue on current medication for now.   7. HLD: His Lipitor was stopped due to elevated transaminase in the setting of  shock liver and hypotension, last LFT was normal, Lipitor has been resumed, and repeat fasting lipid and LFT in 3 month has been ordered.   Current medicines are reviewed with the patient today.  The patient Has no concerns regarding medicines.  The following changes have been made:  See above Labs/ tests ordered today include:see above  Disposition:   FU:  see above  Signed, Nada Boozer, NP  04/27/2016 1:19 PM    [facility] [address] [place], [place], [place]  [address]/ [address] [place], [place] Phone: [phone]; Fax: [phone]  [phone]

## 2016-04-27 NOTE — Patient Instructions (Addendum)
Medication Instructions:   STOP TAKING  COUMADIN  START TAKING ELIQUIS 5 MG TWICE A DAY  START  TODAY    If you need a refill on your cardiac medications before your next appointment, please call your pharmacy.  Labwork: BMET  TODAY    Testing/Procedures:  NONE ORDER TODAY    Follow-Up:  WITH MC[facility] IN 3 WEEKS    Any Other Special Instructions Will Be Listed Below (If Applicable).

## 2016-05-05 NOTE — Progress Notes (Addendum)
Chief Complaint  Patient presents with  . Shortness of Breath  . Aortic Stenosis  . Coronary Artery Disease     History of Present Illness: 65 yo male with history of CAD s/p CABG, severe aortic stenosis s/p AVR, thoracic aortic aneurysm s/p root replacement, COPD, HTN, HLD, tobacco abuse here today for cardiac follow up. He underwent 4 V CABG, AVR with pericardial valve and aortic root replacement 01/07/16. Post-operative atrial fibrillation and pleural effusions. Chest tubes placed and this was followed by Dr. Laneta Simmers. He has been on Lasix since discharge. He has chronic dyspnea which as been felt to be multi-factorial. He smoked for many years, stopping 5 years ago. He has COPD and is on Symbicort and Spiriva. He was seen in our office 04/27/16 by Nada Boozer, NP and described ongoing dyspnea. Weight had been stable but Lasix increased to 40 mg daily. Since then, his weight has remained stable around 120 lbs. He has minimal lower extremity edema. He has ongoing dyspnea with exertion. No fever, chills, chest pain. He has been checking O2 sats at home and his sats have been low 80s. Today his O2 sat at rest on room air is 77%.   Primary Care Physician: Rose Fillers, PA-C   Past Medical History  Diagnosis Date  . COPD (chronic obstructive pulmonary disease) (HCC)     severe on PFT prior to CABG  . Hypertension   . Hyperlipidemia   . Alcohol abuse     Prior  . Tobacco abuse     Prior  . CAD (coronary artery disease) of bypass graft      4v CABG (LIMA to LAD, SVG to OM, seq SVG to PDA/PLA) by Dr. Laneta Simmers 01/07/2016  . dilated ascending aorta     s/p supra-coronary ascending aortic and hemi-arch replacement using a 30 mm Hemashield under deep hypothermic circulatory arrest during CABG 01/07/2016  . Severe aortic stenosis     s/p aortic valve replacement using 25 mm Edwards Magna-ease pericardial valve by Dr. Laneta Simmers during CABG 01/07/2016  . Persistent atrial fibrillation (HCC)     in the  setting of NSTEMI and persisted after [facility] 01/2016  . Recurrent left pleural effusion     s/p PleurX drain, occured after CABG  . Recurrent right pleural effusion     s/p PleurX drain, occured after CABG    Past Surgical History  Procedure Laterality Date  . Ruptured disc repair  2009 or 2010  . Cardiac catheterization N/A 12/29/2015    Procedure: Right/Left Heart Cath and Coronary Angiography;  Surgeon: Peter M Swaziland, MD;  [facility] INVASIVE CV LAB;  Service: Cardiovascular;  Laterality: N/A;  . Coronary artery bypass graft N/A 01/07/2016    Procedure: CORONARY ARTERY BYPASS GRAFTING (CABG), ON PUMP, TIMES FOUR, USING LEFT INTERNAL MAMMARY ARTERY, BILATERAL GREATER SAPHENOUS VEINS HARVESTED ENDOSCOPICALLY;  Surgeon: Alleen Borne, MD;  Location: [facility] OR;  Service: Open Heart Surgery;  Laterality: N/A;  LIMA to LAD, SVG to OM, SVG SEQUENTIALLY to PDA and PLB  . Tee without cardioversion N/A 01/07/2016    Procedure: TRANSESOPHAGEAL ECHOCARDIOGRAM (TEE);  Surgeon: Alleen Borne, MD;  Location: [facility] OR;  Service: Open Heart Surgery;  Laterality: N/A;  . Thoracic aortic aneurysm repair N/A 01/07/2016    Procedure: CIRC ARREST AND REPLACEMENT OF ASCENDING THORACIC  ANEURYSM;  Surgeon: Alleen Borne, MD;  Location: [facility] OR;  Service: Open Heart Surgery;  Laterality: N/A;  . Chest tube insertion Bilateral 01/25/2016  Procedure: INSERTION Bilateral PLEURAL DRAINAGE CATHETER;  Surgeon: Alleen BorneBryan K Bartle, MD;  Location: [facility] OR;  Service: Thoracic;  Laterality: Bilateral;  . Removal of pleural drainage catheter Left 03/17/2016    Procedure: REMOVAL OF PLEURAL DRAINAGE CATHETER;  Surgeon: Alleen BorneBryan K Bartle, MD;  Location: [facility] OR;  Service: Thoracic;  Laterality: Left;    Current Outpatient Prescriptions  Medication Sig Dispense Refill  . apixaban (ELIQUIS) 5 MG TABS tablet Take 1 tablet (5 mg total) by mouth 2 (two) times daily. 60 tablet 5  . aspirin 81 MG EC tablet Take 1 tablet (81 mg total) by mouth daily. 30  tablet 1  . atorvastatin (LIPITOR) 80 MG tablet Take 1 tablet (80 mg total) by mouth daily. 90 tablet 3  . budesonide-formoterol (SYMBICORT) 160-4.5 MCG/ACT inhaler Inhale 2 puffs into the lungs 2 (two) times daily as needed (For shortness of breath.).     Marland Kitchen. famotidine (PEPCID) 20 MG tablet Take 1 tablet (20 mg total) by mouth daily. 30 tablet 0  . furosemide (LASIX) 40 MG tablet Take 1 tablet (40 mg total) by mouth daily. 90 tablet 3  . metoprolol tartrate (LOPRESSOR) 25 MG tablet Take 1 tablet (25 mg total) by mouth 2 (two) times daily. 180 tablet 3  . sodium chloride (OCEAN) 0.65 % SOLN nasal spray Place 1 spray into both nostrils as needed for congestion.    Marland Kitchen. tiotropium (SPIRIVA) 18 MCG inhalation capsule Place 18 mcg into inhaler and inhale daily as needed (For shortness of breath.).      No current facility-administered medications for this visit.    No Known Allergies  Social History   Social History  . Marital Status: Single    Spouse Name: N/A  . Number of Children: 3  . Years of Education: GED   Occupational History  . Retired heavy Arboriculturistequipment operator    Social History Main Topics  . Smoking status: Former Games developermoker  . Smokeless tobacco: Never Used     Comment: quit 2010 - up to 3 ppd for 50 years  . Alcohol Use: No     Comment: quit 2004 - 12 pack per day for 30 years  . Drug Use: No     Comment: quit 2015  . Sexual Activity: Not on file   Other Topics Concern  . Not on file   Social History Narrative   Lives with son   Drinks one cup of coffee a day and one soda q2-3 days    Family History  Problem Relation Age of Onset  . Diabetes Father   . Heart attack Neg Hx   . Stroke Mother   . Stroke Father   . Kidney disease Father   . Kidney disease Sister   . Kidney disease Brother     Review of Systems:  As stated in the HPI and otherwise negative.   BP 100/64 mmHg  Pulse 84  Ht 5\' 9"  (1.753 m)  Wt 121 lb 6.4 oz (55.067 kg)  BMI 17.92 kg/m2  SpO2  77%  Physical Examination: General: Well developed, thin male. NAD HEENT: OP clear, mucus membranes moist SKIN: warm, dry. No rashes. Neuro: No focal deficits Musculoskeletal: Muscle strength 5/5 all ext Psychiatric: Mood and affect normal Neck: No JVD, no carotid bruits, no thyromegaly, no lymphadenopathy. Lungs:Clear bilaterally, no wheezes, rhonci, crackles Cardiovascular: Irreg irreg with no loud murmurs. No gallops or rubs. Abdomen:Soft. Bowel sounds present. Non-tender.  Extremities: No lower extremity edema.   Echo 01/27/16: - Left  ventricle: The cavity size was normal. Systolic function was  mildly reduced. The estimated ejection fraction was in the range  of 45% to 50%. Images were inadequate for LV wall motion  assessment. - Ventricular septum: Septal motion showed paradox. - Left atrium: The atrium was severely dilated. - Right ventricle: The cavity size was mildly dilated. Systolic  function was reduced. - Right atrium: The atrium was mildly dilated. - Pericardium, extracardiac: A small pericardial effusion was  identified posterior to the heart. There was no evidence of  hemodynamic compromise.  Cath 12/29/15: Dominance: Right   Left Main   . Ost LM lesion, 50% stenosed. Moderately Calcified.     Left Anterior Descending   . Ost LAD to Mid LAD lesion, 50% stenosed. Severely Calcified diffuse.   [place] Purl. Dist LAD lesion, 70% stenosed.     Left Circumflex   . Ost Cx to Prox Cx lesion, 70% stenosed. Moderately Calcified.     Right Coronary Artery   . Ost RCA to Prox RCA lesion, 90% stenosed. Severely Calcified.   . Mid RCA lesion, 45% stenosed. Severely Calcified.     EKG:  EKG is not ordered today. The ekg ordered today demonstrates   Recent Labs: 12/27/2015: B Natriuretic Peptide 1306.4* 01/04/2016: TSH 2.395 02/10/2016: Magnesium 1.7 02/26/2016: ALT 18 02/27/2016: Hemoglobin 12.5*; Platelets 229 04/27/2016: BUN 19; Creat 1.03; Potassium 4.3; Sodium 145    Lipid Panel    Component Value Date/Time   CHOL 168 02/26/2016 1201   TRIG 114 02/26/2016 1201   HDL 39* 02/26/2016 1201   CHOLHDL 4.3 02/26/2016 1201   VLDL 23 02/26/2016 1201   LDLCALC 106 02/26/2016 1201     Wt Readings from Last 3 Encounters:  05/06/16 121 lb 6.4 oz (55.067 kg)  04/27/16 119 lb (53.978 kg)  04/15/16 120 lb (54.432 kg)     Other studies Reviewed: Additional studies/ records that were reviewed today include: . Review of the above records demonstrates:    Assessment and Plan:   1. Chronic combined systolic and diastolic CHF: Volume status is stable on Lasix 40 mg daily. I do not think his dyspnea is related to volume overload. Weight is down 20 lbs from April 2017 when he was felt to be volume overloaded. He is dyspneic. See below.    2. Dyspnea/hypoxia:  He remains dyspneic and hypoxic. He is known to have prior pleural effusions requiring chest tube placement following his surgery. He is also known to have a pericardial effusion. He has COPD. He has chronic CHF. The differential for his dyspnea includes all of these things. Will [facility] at [facility]. Will start supplemental O2. Chest x-ray today. Repeat echo today. Will check CMET, CBC, d-dimer. Will continue home meds for now. I will not increase his diuretics. If there is no obvious cause for dyspnea, will need to get Pulmonary team involved as this could be due to his COPD. No ischemic workup is planned.   3. CAD:  s/p 4v CABG (LIMA to LAD, SVG to OM, seq SVG to PDA/PLA) by Dr. Laneta SimmersBartle 01/07/2016. He is having no angina. I do not suspect that his dyspnea is related to his CAD.   4. Thoracic aortic aneurysm: Dilated ascending aorta s/p supra-coronary ascending aortic and hemi-arch replacement using a 30 mm Hemashield per Dr. Laneta SimmersBartle in March 2017.   5. Severe AS: s/p aortic valve replacement using 25 mm Edwards Magna-ease pericardial valve march 2017.  6. HTN: Blood pressure controlled. Continue on  current medication for now.   7. HLD: His Lipitor was stopped due to elevated transaminase in the setting of shock liver and hypotension, last LFT was normal, Lipitor has been resumed.   8.Persistent atrial fibrillation: He is rate controlled on metoprolol. He has been anticoagulated with Eliquis for 9 days. (coumadin stopped at last visit). His atrial fib could be contributing to his dyspnea. May need to consider TEE guided DCCV next week.      Current medicines are reviewed at length with the patient today.  The patient does not have concerns regarding medicines.  The following changes have been made:  no change  Labs/ tests ordered today include:  No orders of the defined types were placed in this encounter.     Disposition:   FU with me after discharge.    Signed, Verne Carrow, MD 05/06/2016 10:09 AM    [facility] [address] [place], [place], [place]  [address] Phone: [phone]; Fax: [phone]

## 2016-05-06 ENCOUNTER — Inpatient Hospital Stay (HOSPITAL_COMMUNITY)
Admission: AD | Admit: 2016-05-06 | Discharge: 2016-05-12 | DRG: 302 | Disposition: A | Discharge status: Home / Self Care | Payer: Medicaid Other | Source: Ambulatory Visit | Attending: Cardiovascular Disease | Admitting: Cardiovascular Disease

## 2016-05-06 ENCOUNTER — Encounter (HOSPITAL_COMMUNITY): Payer: Self-pay

## 2016-05-06 ENCOUNTER — Ambulatory Visit (INDEPENDENT_AMBULATORY_CARE_PROVIDER_SITE_OTHER): Payer: Medicaid Other | Admitting: Cardiovascular Disease

## 2016-05-06 ENCOUNTER — Inpatient Hospital Stay (HOSPITAL_COMMUNITY): Payer: Medicaid Other

## 2016-05-06 ENCOUNTER — Encounter: Payer: Self-pay | Admitting: Cardiovascular Disease

## 2016-05-06 VITALS — BP 100/64 | HR 84 | Ht 69.0 in | Wt 121.4 lb

## 2016-05-06 DIAGNOSIS — I481 Persistent atrial fibrillation: Secondary | ICD-10-CM | POA: Diagnosis present

## 2016-05-06 DIAGNOSIS — Z7982 Long term (current) use of aspirin: Secondary | ICD-10-CM

## 2016-05-06 DIAGNOSIS — I2510 Atherosclerotic heart disease of native coronary artery without angina pectoris: Principal | ICD-10-CM | POA: Diagnosis present

## 2016-05-06 DIAGNOSIS — J189 Pneumonia, unspecified organism: Secondary | ICD-10-CM | POA: Diagnosis present

## 2016-05-06 DIAGNOSIS — I480 Paroxysmal atrial fibrillation: Secondary | ICD-10-CM

## 2016-05-06 DIAGNOSIS — E785 Hyperlipidemia, unspecified: Secondary | ICD-10-CM | POA: Diagnosis present

## 2016-05-06 DIAGNOSIS — Z952 Presence of prosthetic heart valve: Secondary | ICD-10-CM | POA: Diagnosis not present

## 2016-05-06 DIAGNOSIS — I252 Old myocardial infarction: Secondary | ICD-10-CM

## 2016-05-06 DIAGNOSIS — J90 Pleural effusion, not elsewhere classified: Secondary | ICD-10-CM | POA: Diagnosis not present

## 2016-05-06 DIAGNOSIS — Z87891 Personal history of nicotine dependence: Secondary | ICD-10-CM | POA: Diagnosis not present

## 2016-05-06 DIAGNOSIS — I5033 Acute on chronic diastolic (congestive) heart failure: Secondary | ICD-10-CM | POA: Diagnosis not present

## 2016-05-06 DIAGNOSIS — R0600 Dyspnea, unspecified: Secondary | ICD-10-CM | POA: Diagnosis present

## 2016-05-06 DIAGNOSIS — R791 Abnormal coagulation profile: Secondary | ICD-10-CM

## 2016-05-06 DIAGNOSIS — R0902 Hypoxemia: Secondary | ICD-10-CM | POA: Diagnosis not present

## 2016-05-06 DIAGNOSIS — I4891 Unspecified atrial fibrillation: Secondary | ICD-10-CM

## 2016-05-06 DIAGNOSIS — I5043 Acute on chronic combined systolic (congestive) and diastolic (congestive) heart failure: Secondary | ICD-10-CM | POA: Diagnosis present

## 2016-05-06 DIAGNOSIS — R0602 Shortness of breath: Secondary | ICD-10-CM | POA: Diagnosis present

## 2016-05-06 DIAGNOSIS — J440 Chronic obstructive pulmonary disease with acute lower respiratory infection: Secondary | ICD-10-CM | POA: Diagnosis present

## 2016-05-06 DIAGNOSIS — I350 Nonrheumatic aortic (valve) stenosis: Secondary | ICD-10-CM

## 2016-05-06 DIAGNOSIS — R7989 Other specified abnormal findings of blood chemistry: Secondary | ICD-10-CM

## 2016-05-06 DIAGNOSIS — I25810 Atherosclerosis of coronary artery bypass graft(s) without angina pectoris: Secondary | ICD-10-CM

## 2016-05-06 DIAGNOSIS — J449 Chronic obstructive pulmonary disease, unspecified: Secondary | ICD-10-CM

## 2016-05-06 DIAGNOSIS — I110 Hypertensive heart disease with heart failure: Secondary | ICD-10-CM | POA: Diagnosis present

## 2016-05-06 DIAGNOSIS — I959 Hypotension, unspecified: Secondary | ICD-10-CM | POA: Diagnosis not present

## 2016-05-06 DIAGNOSIS — I10 Essential (primary) hypertension: Secondary | ICD-10-CM | POA: Diagnosis not present

## 2016-05-06 DIAGNOSIS — I5023 Acute on chronic systolic (congestive) heart failure: Secondary | ICD-10-CM | POA: Diagnosis not present

## 2016-05-06 LAB — COMPREHENSIVE METABOLIC PANEL
ALBUMIN: 3 g/dL — AB (ref 3.5–5.0)
ALK PHOS: 114 U/L (ref 38–126)
ALT: 30 U/L (ref 17–63)
AST: 32 U/L (ref 15–41)
Anion gap: 10 (ref 5–15)
BILIRUBIN TOTAL: 0.9 mg/dL (ref 0.3–1.2)
BUN: 20 mg/dL (ref 6–20)
CALCIUM: 9.3 mg/dL (ref 8.9–10.3)
CO2: 28 mmol/L (ref 22–32)
CREATININE: 1.24 mg/dL (ref 0.61–1.24)
Chloride: 102 mmol/L (ref 101–111)
GFR calc Af Amer: >60 mL/min (ref >60)
GFR, EST NON AFRICAN AMERICAN: 60 mL/min — AB (ref >60)
GLUCOSE: 107 mg/dL — AB (ref 65–99)
POTASSIUM: 3.2 mmol/L — AB (ref 3.5–5.1)
Sodium: 140 mmol/L (ref 135–145)
TOTAL PROTEIN: 6.9 g/dL (ref 6.5–8.1)

## 2016-05-06 LAB — MAGNESIUM: MAGNESIUM: 1.8 mg/dL (ref 1.7–2.4)

## 2016-05-06 LAB — BRAIN NATRIURETIC PEPTIDE: B NATRIURETIC PEPTIDE 5: 589.9 pg/mL — AB (ref 0.0–100.0)

## 2016-05-06 LAB — TSH: TSH: 2.799 u[IU]/mL (ref 0.350–4.500)

## 2016-05-06 LAB — TROPONIN I: Troponin I: 0.05 ng/mL (ref <0.03)

## 2016-05-06 LAB — D-DIMER, QUANTITATIVE: D-Dimer, Quant: 6.16 ug/mL-FEU — ABNORMAL HIGH (ref 0.00–0.50)

## 2016-05-06 LAB — GLUCOSE, CAPILLARY: Glucose-Capillary: 115 mg/dL — ABNORMAL HIGH (ref 65–99)

## 2016-05-06 MED ORDER — MOMETASONE FURO-FORMOTEROL FUM 200-5 MCG/ACT IN AERO
2.0000 | INHALATION_SPRAY | Freq: Two times a day (BID) | RESPIRATORY_TRACT | Status: DC
  Administered 2016-05-06 – 2016-05-12 (×10): 2 via RESPIRATORY_TRACT
  Filled 2016-05-06 (×2): qty 8.8

## 2016-05-06 MED ORDER — METOPROLOL TARTRATE 25 MG PO TABS: 25.0000 mg | ORAL_TABLET | Freq: Two times a day (BID) | ORAL | Status: DC

## 2016-05-06 MED ORDER — FUROSEMIDE 40 MG PO TABS: 40.0000 mg | ORAL_TABLET | Freq: Every day | ORAL | Status: DC

## 2016-05-06 MED ORDER — ACETAMINOPHEN 325 MG PO TABS: 650.0000 mg | ORAL_TABLET | ORAL | Status: DC | PRN

## 2016-05-06 MED ORDER — ASPIRIN EC 81 MG PO TBEC
81.0000 mg | DELAYED_RELEASE_TABLET | Freq: Every day | ORAL | Status: DC
  Administered 2016-05-07 – 2016-05-12 (×6): 81 mg via ORAL
  Filled 2016-05-06 (×6): qty 1

## 2016-05-06 MED ORDER — IOPAMIDOL (ISOVUE-370) INJECTION 76%
INTRAVENOUS | Status: AC
Start: 2016-05-06 — End: 2016-05-06
  Administered 2016-05-06: 100 mL
  Filled 2016-05-06: qty 100

## 2016-05-06 MED ORDER — ATORVASTATIN CALCIUM 80 MG PO TABS: 80.0000 mg | ORAL_TABLET | Freq: Every day | ORAL | Status: DC

## 2016-05-06 MED ORDER — SODIUM CHLORIDE 0.9% FLUSH
3.0000 mL | Freq: Two times a day (BID) | INTRAVENOUS | Status: DC
  Administered 2016-05-06 – 2016-05-10 (×7): 3 mL via INTRAVENOUS

## 2016-05-06 MED ORDER — APIXABAN 5 MG PO TABS
5.0000 mg | ORAL_TABLET | Freq: Two times a day (BID) | ORAL | Status: DC
  Administered 2016-05-06 – 2016-05-09 (×6): 5 mg via ORAL
  Filled 2016-05-06 (×6): qty 1

## 2016-05-06 MED ORDER — TIOTROPIUM BROMIDE MONOHYDRATE 18 MCG IN CAPS: 18.0000 ug | ORAL_CAPSULE | Freq: Every day | RESPIRATORY_TRACT | Status: DC

## 2016-05-06 MED ORDER — METOPROLOL TARTRATE 25 MG PO TABS
25.0000 mg | ORAL_TABLET | Freq: Two times a day (BID) | ORAL | Status: DC
  Administered 2016-05-06 – 2016-05-11 (×11): 25 mg via ORAL
  Filled 2016-05-06 (×12): qty 1

## 2016-05-06 MED ORDER — TIOTROPIUM BROMIDE MONOHYDRATE 18 MCG IN CAPS
18.0000 ug | ORAL_CAPSULE | Freq: Every day | RESPIRATORY_TRACT | Status: DC
  Administered 2016-05-07 – 2016-05-12 (×5): 18 ug via RESPIRATORY_TRACT
  Filled 2016-05-06: qty 5

## 2016-05-06 MED ORDER — SODIUM CHLORIDE 0.9 % IV SOLN: 250.0000 mL | INTRAVENOUS | Status: DC | PRN

## 2016-05-06 MED ORDER — NITROGLYCERIN 0.4 MG SL SUBL: 0.4000 mg | SUBLINGUAL_TABLET | SUBLINGUAL | Status: DC | PRN

## 2016-05-06 MED ORDER — MOMETASONE FURO-FORMOTEROL FUM 200-5 MCG/ACT IN AERO: 2.0000 | INHALATION_SPRAY | Freq: Two times a day (BID) | RESPIRATORY_TRACT | Status: DC

## 2016-05-06 MED ORDER — ATORVASTATIN CALCIUM 80 MG PO TABS
80.0000 mg | ORAL_TABLET | Freq: Every day | ORAL | Status: DC
  Administered 2016-05-07 – 2016-05-12 (×5): 80 mg via ORAL
  Filled 2016-05-06 (×5): qty 1

## 2016-05-06 MED ORDER — FAMOTIDINE 20 MG PO TABS: 20.0000 mg | ORAL_TABLET | Freq: Every day | ORAL | Status: DC

## 2016-05-06 MED ORDER — FUROSEMIDE 10 MG/ML IJ SOLN
40.0000 mg | Freq: Two times a day (BID) | INTRAMUSCULAR | Status: DC
  Administered 2016-05-06 – 2016-05-10 (×8): 40 mg via INTRAVENOUS
  Filled 2016-05-06 (×9): qty 4

## 2016-05-06 MED ORDER — ASPIRIN 81 MG PO TBEC: 81.0000 mg | DELAYED_RELEASE_TABLET | Freq: Every day | ORAL | Status: DC

## 2016-05-06 MED ORDER — POTASSIUM CHLORIDE CRYS ER 20 MEQ PO TBCR
40.0000 meq | EXTENDED_RELEASE_TABLET | Freq: Every day | ORAL | Status: AC
  Administered 2016-05-06 – 2016-05-07 (×2): 40 meq via ORAL
  Filled 2016-05-06 (×2): qty 2

## 2016-05-06 MED ORDER — APIXABAN 5 MG PO TABS: 5.0000 mg | ORAL_TABLET | Freq: Two times a day (BID) | ORAL | Status: DC

## 2016-05-06 MED ORDER — SODIUM CHLORIDE 0.9% FLUSH: 3.0000 mL | INTRAVENOUS | Status: DC | PRN

## 2016-05-06 MED ORDER — ONDANSETRON HCL 4 MG/2ML IJ SOLN
4.0000 mg | Freq: Four times a day (QID) | INTRAMUSCULAR | Status: DC | PRN
Start: 2016-05-06 — End: 2016-05-12
  Administered 2016-05-11: 4 mg via INTRAVENOUS
  Filled 2016-05-06: qty 2

## 2016-05-06 NOTE — Progress Notes (Signed)
Critical lab value called: troponin 0.05

## 2016-05-06 NOTE — H&P (Signed)
Chief Complaint  Patient presents with  . Shortness of Breath  . Aortic Stenosis  . Coronary Artery Disease    History of Present Illness: 65 yo male with history of CAD s/p CABG, severe aortic stenosis s/p AVR, thoracic aortic aneurysm s/p root replacement, COPD, HTN, HLD, tobacco abuse here today for cardiac follow up. He underwent 4 V CABG, AVR with pericardial valve and aortic root replacement 01/07/16. Post-operative atrial fibrillation and pleural effusions. Chest tubes placed and this was followed by Dr. Laneta Simmers. He has been on Lasix since discharge. He has chronic dyspnea which as been felt to be multi-factorial. He smoked for many years, stopping 5 years ago. He has COPD and is on Symbicort and Spiriva. He was seen in our office 04/27/16 by Nada Boozer, NP and described ongoing dyspnea. Weight had been stable but Lasix increased to 40 mg daily. Since then, his weight has remained stable around 120 lbs. He has minimal lower extremity edema. He has ongoing dyspnea with exertion. No fever, chills, chest pain. He has been checking O2 sats at home and his sats have been low 80s. Today his O2 sat at rest on room air is 77%.   Primary Care Physician: Rose Fillers, PA-C   Past Medical History  Diagnosis Date  . COPD (chronic obstructive pulmonary disease) (HCC)     severe on PFT prior to CABG  . Hypertension   . Hyperlipidemia   . Alcohol abuse     Prior  . Tobacco abuse     Prior  . CAD (coronary artery disease) of bypass graft     4v CABG (LIMA to LAD, SVG to OM, seq SVG to PDA/PLA) by Dr. Laneta Simmers 01/07/2016  . dilated ascending aorta     s/p supra-coronary ascending aortic and hemi-arch replacement using a 30 mm Hemashield under deep hypothermic circulatory arrest during CABG 01/07/2016  . Severe aortic stenosis     s/p aortic valve replacement using 25 mm Edwards Magna-ease pericardial valve by Dr. Laneta Simmers during CABG  01/07/2016  . Persistent atrial fibrillation (HCC)     in the setting of NSTEMI and persisted after [facility] 01/2016  . Recurrent left pleural effusion     s/p PleurX drain, occured after CABG  . Recurrent right pleural effusion     s/p PleurX drain, occured after CABG    Past Surgical History  Procedure Laterality Date  . Ruptured disc repair  2009 or 2010  . Cardiac catheterization N/A 12/29/2015    Procedure: Right/Left Heart Cath and Coronary Angiography; Surgeon: Peter M Swaziland, MD; Location: [facility] INVASIVE CV LAB; Service: Cardiovascular; Laterality: N/A;  . Coronary artery bypass graft N/A 01/07/2016    Procedure: CORONARY ARTERY BYPASS GRAFTING (CABG), ON PUMP, TIMES FOUR, USING LEFT INTERNAL MAMMARY ARTERY, BILATERAL GREATER SAPHENOUS VEINS HARVESTED ENDOSCOPICALLY; Surgeon: Alleen Borne, MD; Location: [facility] OR; Service: Open Heart Surgery; Laterality: N/A; LIMA to LAD, SVG to OM, SVG SEQUENTIALLY to PDA and PLB  . Tee without cardioversion N/A 01/07/2016    Procedure: TRANSESOPHAGEAL ECHOCARDIOGRAM (TEE); Surgeon: Alleen Borne, MD; Location: [facility] OR; Service: Open Heart Surgery; Laterality: N/A;  . Thoracic aortic aneurysm repair N/A 01/07/2016    Procedure: CIRC ARREST AND REPLACEMENT OF ASCENDING THORACIC ANEURYSM; Surgeon: Alleen Borne, MD; Location: [facility] OR; Service: Open Heart Surgery; Laterality: N/A;  . Chest tube insertion Bilateral 01/25/2016    Procedure: INSERTION Bilateral PLEURAL DRAINAGE CATHETER; Surgeon: Alleen Borne, MD; Location: [facility] OR; Service: Thoracic; Laterality: Bilateral;  .  Removal of pleural drainage catheter Left 03/17/2016    Procedure: REMOVAL OF PLEURAL DRAINAGE CATHETER; Surgeon: Alleen BorneBryan K Bartle, MD; Location: [facility] OR; Service: Thoracic; Laterality: Left;    Current Outpatient Prescriptions  Medication Sig Dispense Refill  . apixaban (ELIQUIS) 5 MG TABS tablet Take  1 tablet (5 mg total) by mouth 2 (two) times daily. 60 tablet 5  . aspirin 81 MG EC tablet Take 1 tablet (81 mg total) by mouth daily. 30 tablet 1  . atorvastatin (LIPITOR) 80 MG tablet Take 1 tablet (80 mg total) by mouth daily. 90 tablet 3  . budesonide-formoterol (SYMBICORT) 160-4.5 MCG/ACT inhaler Inhale 2 puffs into the lungs 2 (two) times daily as needed (For shortness of breath.).     Marland Kitchen. famotidine (PEPCID) 20 MG tablet Take 1 tablet (20 mg total) by mouth daily. 30 tablet 0  . furosemide (LASIX) 40 MG tablet Take 1 tablet (40 mg total) by mouth daily. 90 tablet 3  . metoprolol tartrate (LOPRESSOR) 25 MG tablet Take 1 tablet (25 mg total) by mouth 2 (two) times daily. 180 tablet 3  . sodium chloride (OCEAN) 0.65 % SOLN nasal spray Place 1 spray into both nostrils as needed for congestion.    Marland Kitchen. tiotropium (SPIRIVA) 18 MCG inhalation capsule Place 18 mcg into inhaler and inhale daily as needed (For shortness of breath.).      No current facility-administered medications for this visit.    No Known Allergies  Social History   Social History  . Marital Status: Single    Spouse Name: N/A  . Number of Children: 3  . Years of Education: GED   Occupational History  . Retired heavy Arboriculturistequipment operator    Social History Main Topics  . Smoking status: Former Games developermoker  . Smokeless tobacco: Never Used     Comment: quit 2010 - up to 3 ppd for 50 years  . Alcohol Use: No     Comment: quit 2004 - 12 pack per day for 30 years  . Drug Use: No     Comment: quit 2015  . Sexual Activity: Not on file   Other Topics Concern  . Not on file   Social History Narrative   Lives with son   Drinks one cup of coffee a day and one soda q2-3 days    Family History  Problem Relation Age of Onset  . Diabetes Father   . Heart attack Neg Hx   . Stroke Mother   . Stroke  Father   . Kidney disease Father   . Kidney disease Sister   . Kidney disease Brother     Review of Systems: As stated in the HPI and otherwise negative.   BP 100/64 mmHg  Pulse 84  Ht 5\' 9"  (1.753 m)  Wt 121 lb 6.4 oz (55.067 kg)  BMI 17.92 kg/m2  SpO2 77%  Physical Examination: General: Well developed, thin male. NAD  HEENT: OP clear, mucus membranes moist  SKIN: warm, dry. No rashes. Neuro: No focal deficits  Musculoskeletal: Muscle strength 5/5 all ext  Psychiatric: Mood and affect normal  Neck: No JVD, no carotid bruits, no thyromegaly, no lymphadenopathy.  Lungs:Clear bilaterally, no wheezes, rhonci, crackles Cardiovascular: Irreg irreg with no loud murmurs. No gallops or rubs. Abdomen:Soft. Bowel sounds present. Non-tender.  Extremities: No lower extremity edema.   Echo 01/27/16: - Left ventricle: The cavity size was normal. Systolic function was  mildly reduced. The estimated ejection fraction was in the range  of 45%  to 50%. Images were inadequate for LV wall motion  assessment. - Ventricular septum: Septal motion showed paradox. - Left atrium: The atrium was severely dilated. - Right ventricle: The cavity size was mildly dilated. Systolic  function was reduced. - Right atrium: The atrium was mildly dilated. - Pericardium, extracardiac: A small pericardial effusion was  identified posterior to the heart. There was no evidence of  hemodynamic compromise.  Cath 12/29/15: Dominance: Right   Left Main   . Ost LM lesion, 50% stenosed. Moderately Calcified.     Left Anterior Descending   . Ost LAD to Mid LAD lesion, 50% stenosed. Severely Calcified diffuse.   Royden Purl LAD lesion, 70% stenosed.     Left Circumflex   . Ost Cx to Prox Cx lesion, 70% stenosed. Moderately Calcified.     Right Coronary Artery   . Ost RCA to Prox RCA lesion, 90% stenosed. Severely Calcified.   . Mid RCA lesion, 45% stenosed.  Severely Calcified.     EKG: EKG is not ordered today. The ekg ordered today demonstrates   Recent Labs: 12/27/2015: B Natriuretic Peptide 1306.4* 01/04/2016: TSH 2.395 02/10/2016: Magnesium 1.7 02/26/2016: ALT 18 02/27/2016: Hemoglobin 12.5*; Platelets 229 04/27/2016: BUN 19; Creat 1.03; Potassium 4.3; Sodium 145   Lipid Panel  Labs (Brief)       Component Value Date/Time   CHOL 168 02/26/2016 1201   TRIG 114 02/26/2016 1201   HDL 39* 02/26/2016 1201   CHOLHDL 4.3 02/26/2016 1201   VLDL 23 02/26/2016 1201   LDLCALC 106 02/26/2016 1201      Wt Readings from Last 3 Encounters:  05/06/16 121 lb 6.4 oz (55.067 kg)  04/27/16 119 lb (53.978 kg)  04/15/16 120 lb (54.432 kg)     Other studies Reviewed: Additional studies/ records that were reviewed today include: . Review of the above records demonstrates:    Assessment and Plan:   1. Chronic combined systolic and diastolic CHF: Volume status is stable on Lasix 40 mg daily. I do not think his dyspnea is related to volume overload. Weight is down 20 lbs from April 2017 when he was felt to be volume overloaded. He is dyspneic. See below.   2. Dyspnea/hypoxia: He remains dyspneic and hypoxic. He is known to have prior pleural effusions requiring chest tube placement following his surgery. He is also known to have a pericardial effusion. He has COPD. He has chronic CHF. The differential for his dyspnea includes all of these things. [facility]. Will start supplemental O2. Chest x-ray today. Repeat echo today. Will check CMET, CBC, d-dimer. Will continue home meds for now. I will not increase his diuretics. If there is no obvious cause for dyspnea, will need to get Pulmonary team involved as this could be due to his COPD. No ischemic workup is planned.   3. CAD: s/p 4v CABG (LIMA to LAD, SVG to OM, seq SVG to PDA/PLA) by Dr. Laneta Simmers 01/07/2016. He is having no angina. I do  not suspect that his dyspnea is related to his CAD.   4. Thoracic aortic aneurysm: Dilated ascending aorta s/p supra-coronary ascending aortic and hemi-arch replacement using a 30 mm Hemashield per Dr. Laneta Simmers in March 2017.   5. Severe AS: s/p aortic valve replacement using 25 mm Edwards Magna-ease pericardial valve march 2017.  6. HTN: Blood pressure controlled. Continue on current medication for now.   7. HLD: His Lipitor was stopped due to elevated transaminase in the setting of  shock liver and hypotension, last LFT was normal, Lipitor has been resumed.   8.Persistent atrial fibrillation: He is rate controlled on metoprolol. He has been anticoagulated with Eliquis for 9 days. (coumadin stopped at last visit). His atrial fib could be contributing to his dyspnea. May need to consider TEE guided DCCV next week.     Signed, Verne Carrowhristopher Dawnielle Christiana, MD 05/06/2016 10:09 AM  [name] [address], [place], [place] [address] Phone: [phone]; Fax: [phone]

## 2016-05-06 NOTE — Patient Instructions (Signed)
Medication Instructions:  Your physician recommends that you continue on your current medications as directed. Please refer to the Current Medication list given to you today.   Labwork: None   Testing/Procedures: None   Follow-Up: Dr Clifton JamesMcAlhany is [facility]. [facility] to admitting now.        If you need a refill on your cardiac medications before your next appointment, please call your pharmacy.

## 2016-05-06 NOTE — Progress Notes (Addendum)
  Went to check on the patient following his arrival from the office. Appears to be breathing comfortably, granted oxygen saturations are in the high 80's. I reviewed his labs and his D-dimer was elevated to 6.16. Troponin mildly elevated to 0.05. CXR with small bilateral pleural effusions.  He has been on Eliquis for 9 days with standardized atrial fibrillation dosing. Prior to this he was on Coumadin with sub therapetuic INR's since 02/2016.  Will obtain STAT CT Chest. Touched base with Pharmacy in case his CT does come back positive for PE. She recommended switching from Eliquis to Xarelto (dosing of 15mg  BID for 21 days, then 20mg  once daily afterwards).   Will await results of STAT CT prior to switching medications. Dr. Clifton JamesMcAlhany made aware.  Signed, Ellsworth LennoxBrittany M Naythen Heikkila, PA-C 05/06/2016, 2:54 PM Pager: [phone]  CT Results as Below:   IMPRESSION: No evidence of pulmonary emboli.  Large bilateral pleural effusions which have increased in the interval from the prior exam. Mild lower lobe atelectasis is noted bilaterally improved from the prior study.  The previously seen periaortic hemorrhage has resolved in the Interval.  Will continue with current Eliquis dosing in the setting of no PE. In regards to the large bilateral pleural effusions, will transition from PO to IV Lasix. Monitor renal function and electrolytes closely. Echo is pending. Patient recently had PleurX catheter removed. May need to consult CT surgery tomorrow about potential thoracentesis if symptoms not improved.  Signed, Ellsworth LennoxBrittany M Mandy Peeks, PA-C 05/06/2016, 4:57 PM Pager: [phone]

## 2016-05-07 ENCOUNTER — Inpatient Hospital Stay (HOSPITAL_COMMUNITY): Payer: Medicaid Other

## 2016-05-07 DIAGNOSIS — R0600 Dyspnea, unspecified: Secondary | ICD-10-CM

## 2016-05-07 LAB — BASIC METABOLIC PANEL
Anion gap: 7 (ref 5–15)
BUN: 20 mg/dL (ref 6–20)
CALCIUM: 8.9 mg/dL (ref 8.9–10.3)
CO2: 30 mmol/L (ref 22–32)
CREATININE: 1.16 mg/dL (ref 0.61–1.24)
Chloride: 102 mmol/L (ref 101–111)
GFR calc non Af Amer: >60 mL/min (ref >60)
GLUCOSE: 113 mg/dL — AB (ref 65–99)
Potassium: 3.9 mmol/L (ref 3.5–5.1)
Sodium: 139 mmol/L (ref 135–145)

## 2016-05-07 LAB — LIPID PANEL
CHOLESTEROL: 121 mg/dL (ref 0–200)
HDL: 42 mg/dL (ref >40)
LDL Cholesterol: 59 mg/dL (ref 0–99)
Total CHOL/HDL Ratio: 2.9 RATIO
Triglycerides: 100 mg/dL (ref <150)
VLDL: 20 mg/dL (ref 0–40)

## 2016-05-07 LAB — ECHOCARDIOGRAM COMPLETE
AOPV: 0.67 m/s
AOVTI: 20 cm
AV Peak grad: 5 mmHg
AV peak Index: 1.58
AV vel: 2.95
AVAREAMEANV: 2.63 cm2
AVAREAMEANVIN: 1.63 cm2/m2
AVAREAVTI: 2.56 cm2
AVAREAVTIIND: 1.82 cm2/m2
AVCELMEANRAT: 0.69
AVG: 2 mmHg
AVPKVEL: 116 cm/s
CHL CUP AV VALUE AREA INDEX: 1.82
DOP CAL AO MEAN VELOCITY: 72.3 cm/s
FS: 34 % (ref 28–44)
Height: 69 in
IV/PV OW: 0.98
LA diam end sys: 39 mm
LA diam index: 2.41 cm/m2
LA vol A4C: 50.4 ml
LA vol index: 33.5 mL/m2
LA vol: 54.2 mL
LASIZE: 39 mm
LDCA: 3.8 cm2
LVOT SV: 59 mL
LVOT VTI: 15.5 cm
LVOT peak VTI: 0.78 cm
LVOTD: 22 mm
LVOTPV: 78.1 cm/s
PW: 9.73 mm — AB (ref 0.6–1.1)
Valve area: 2.95 cm2
Weight: 1932.8 oz

## 2016-05-07 LAB — CBC
HCT: 36.2 % — ABNORMAL LOW (ref 39.0–52.0)
Hemoglobin: 11.5 g/dL — ABNORMAL LOW (ref 13.0–17.0)
MCH: 30.3 pg (ref 26.0–34.0)
MCHC: 31.8 g/dL (ref 30.0–36.0)
MCV: 95.5 fL (ref 78.0–100.0)
PLATELETS: 161 10*3/uL (ref 150–400)
RBC: 3.79 MIL/uL — ABNORMAL LOW (ref 4.22–5.81)
RDW: 17.4 % — ABNORMAL HIGH (ref 11.5–15.5)
WBC: 7.5 10*3/uL (ref 4.0–10.5)

## 2016-05-07 NOTE — Progress Notes (Signed)
  Echocardiogram 2D Echocardiogram has been performed.  Craig [name], Craig [name] 05/07/2016, 3:23 PM

## 2016-05-07 NOTE — Progress Notes (Signed)
    Subjective:  Denies CP; still with dyspnea   Objective:  Filed Vitals:   05/06/16 2135 05/07/16 0355 05/07/16 0817 05/07/16 0850  BP: 132/75 106/65 122/73   Pulse: 88 85 92   Temp: 97.9 F (36.6 C) 97.8 F (36.6 C)    TempSrc: Oral Oral    Resp: 18 18    Height:      Weight:  120 lb 12.8 oz (54.795 kg)    SpO2: 98% 95%  93%    Intake/Output from previous day:  Intake/Output Summary (Last 24 hours) at 05/07/16 1115 Last data filed at 05/06/16 2136  Gross per 24 hour  Intake    240 ml  Output    900 ml  Net   -660 ml    Physical Exam: Physical exam: Well-developed frail in no acute distress.  Skin is warm and dry.  HEENT is normal.  Neck is supple.  Chest diminished BS bases bilaterally Cardiovascular exam is irregular Abdominal exam nontender or distended. No masses palpated. Extremities show no edema. neuro grossly intact    Lab Results: Basic Metabolic Panel:  Recent Labs  16/08/9605/30/17 1234 05/07/16 0246  NA 140 139  K 3.2* 3.9  CL 102 102  CO2 28 30  GLUCOSE 107* 113*  BUN 20 20  CREATININE 1.24 1.16  CALCIUM 9.3 8.9  MG 1.8  --    CBC:  Recent Labs  05/07/16 0246  WBC 7.5  HGB 11.5*  HCT 36.2*  MCV 95.5  PLT 161   Cardiac Enzymes:  Recent Labs  05/06/16 1234  TROPONINI 0.05*     Assessment/Plan:  1 dyspnea/hypoxia-Likely multifactorial. He has significant COPD. However his chest CT shows large bilateral pleural effusions which is likely the biggest contributor at this point. Plan to continue diuresis. I have discussed the patient with Dr. Dorris FetchHendrickson. The patient did have a right Pleurx catheter removed recently. He may need repeat Pleurx catheters and we will hold apixaban if necessary. Note d-dimer was elevated but CT showed no pulmonary embolus. Await follow-up echocardiogram. He does not have a large pericardial effusion on CT scan. 2 persistent atrial fibrillation-continue metoprolol; continue apixaban for now unless  procedures needed. This could be contributing to his CHF. If no procedures planned will proceed with TEE guided cardioversion. 3 status post aortic valve replacement/aortic root replacement 4 acute on chronic systolic/diastolic congestive heart failure-continue Lasix; follow renal function. Note he does not appear to be markedly volume overloaded on examination. 5 CAD-continue statin.  Olga MillersBrian Crenshaw 05/07/2016, 11:15 AM

## 2016-05-08 LAB — BASIC METABOLIC PANEL
ANION GAP: 7 (ref 5–15)
BUN: 19 mg/dL (ref 6–20)
CHLORIDE: 100 mmol/L — AB (ref 101–111)
CO2: 30 mmol/L (ref 22–32)
CREATININE: 1.13 mg/dL (ref 0.61–1.24)
Calcium: 8.7 mg/dL — ABNORMAL LOW (ref 8.9–10.3)
GFR calc non Af Amer: >60 mL/min (ref >60)
Glucose, Bld: 112 mg/dL — ABNORMAL HIGH (ref 65–99)
POTASSIUM: 3.6 mmol/L (ref 3.5–5.1)
SODIUM: 137 mmol/L (ref 135–145)

## 2016-05-08 MED ORDER — POTASSIUM CHLORIDE CRYS ER 20 MEQ PO TBCR
40.0000 meq | EXTENDED_RELEASE_TABLET | Freq: Once | ORAL | Status: AC
  Administered 2016-05-08: 40 meq via ORAL
  Filled 2016-05-08: qty 2

## 2016-05-08 NOTE — Progress Notes (Signed)
    Subjective:  Denies CP; still with dyspnea   Objective:  Filed Vitals:   05/07/16 1400 05/07/16 2030 05/07/16 2034 05/08/16 0600  BP: 97/62 102/89  111/72  Pulse: 84   80  Temp: 97.8 F (36.6 C) 97.9 F (36.6 C)  98.1 F (36.7 C)  TempSrc: Oral Oral  Oral  Resp: 18 18  19   Height:      Weight:    115 lb 11.2 oz (52.481 kg)  SpO2: 93% 94% 94% 92%    Intake/Output from previous day:  Intake/Output Summary (Last 24 hours) at 05/08/16 0857 Last data filed at 05/08/16 0600  Gross per 24 hour  Intake      0 ml  Output   2175 ml  Net  -2175 ml    Physical Exam: Physical exam: Well-developed frail in no acute distress.  Skin is warm and dry.  HEENT is normal.  Neck is supple.  Chest diminished BS bases bilaterally Cardiovascular exam is irregular Abdominal exam nontender or distended. No masses palpated. Extremities show no edema. neuro grossly intact    Lab Results: Basic Metabolic Panel:  Recent Labs  16/08/9605/30/17 1234 05/07/16 0246 05/08/16 0257  NA 140 139 137  K 3.2* 3.9 3.6  CL 102 102 100*  CO2 28 30 30   GLUCOSE 107* 113* 112*  BUN 20 20 19   CREATININE 1.24 1.16 1.13  CALCIUM 9.3 8.9 8.7*  MG 1.8  --   --    CBC:  Recent Labs  05/07/16 0246  WBC 7.5  HGB 11.5*  HCT 36.2*  MCV 95.5  PLT 161   Cardiac Enzymes:  Recent Labs  05/06/16 1234  TROPONINI 0.05*     Assessment/Plan:  1 dyspnea/hypoxia-Likely multifactorial. He has significant COPD. However his chest CT shows large bilateral pleural effusions which is likely contributing. Unclear if this is CHF or inflammatory reaction due to recent surgery. Plan to continue diuresis (I/O -2175 yesterday). I have discussed the patient with Dr. Dorris FetchHendrickson. The patient did have a right Pleurx catheter removed recently. He may need repeat Pleurx catheters/pleurodesis and we will hold apixaban if necessary. Will ask Dr Laneta SimmersBartle to review in AM. Note d-dimer was elevated but CT showed no pulmonary  embolus. Echo shows normally functioning aortic valve and normal LV function.  2 persistent atrial fibrillation-continue metoprolol; continue apixaban for now unless procedures needed. This could be contributing to his CHF. If no procedures planned will proceed with TEE guided cardioversion. 3 status post aortic valve replacement/aortic root replacement 4 acute on chronic systolic/diastolic congestive heart failure-continue Lasix; follow renal function. Note he does not appear to be markedly volume overloaded on examination. 5 CAD-continue statin.  Craig [name] 05/08/2016, 8:57 AM

## 2016-05-08 NOTE — Discharge Instructions (Signed)

## 2016-05-09 ENCOUNTER — Inpatient Hospital Stay (HOSPITAL_COMMUNITY): Payer: Medicaid Other

## 2016-05-09 DIAGNOSIS — J90 Pleural effusion, not elsewhere classified: Secondary | ICD-10-CM

## 2016-05-09 DIAGNOSIS — I10 Essential (primary) hypertension: Secondary | ICD-10-CM

## 2016-05-09 DIAGNOSIS — I5023 Acute on chronic systolic (congestive) heart failure: Secondary | ICD-10-CM

## 2016-05-09 LAB — BASIC METABOLIC PANEL
Anion gap: 4 — ABNORMAL LOW (ref 5–15)
BUN: 19 mg/dL (ref 6–20)
CHLORIDE: 99 mmol/L — AB (ref 101–111)
CO2: 34 mmol/L — AB (ref 22–32)
Calcium: 9.1 mg/dL (ref 8.9–10.3)
Creatinine, Ser: 1.19 mg/dL (ref 0.61–1.24)
GFR calc Af Amer: >60 mL/min (ref >60)
GFR calc non Af Amer: >60 mL/min (ref >60)
GLUCOSE: 119 mg/dL — AB (ref 65–99)
POTASSIUM: 4.5 mmol/L (ref 3.5–5.1)
SODIUM: 137 mmol/L (ref 135–145)

## 2016-05-09 MED ORDER — HEPARIN (PORCINE) IN NACL 100-0.45 UNIT/ML-% IJ SOLN
700.0000 [IU]/h | INTRAMUSCULAR | Status: DC
  Administered 2016-05-09: 700 [IU]/h via INTRAVENOUS
  Filled 2016-05-09: qty 250

## 2016-05-09 NOTE — Progress Notes (Signed)
ANTICOAGULATION CONSULT NOTE - Follow Up Consult  Pharmacy Consult for apixiban>>heparin  Indication: atrial fibrillation  No Known Allergies  Patient Measurements: Height: 5\' 9"  (175.3 cm) Weight: 114 lb 12.8 oz (52.073 kg) IBW/kg (Calculated) : 70.7  Vital Signs: Temp: 97.7 F (36.5 C) (07/03 0420) Temp Source: Oral (07/03 0420) BP: 98/66 mmHg (07/03 0420) Pulse Rate: 88 (07/03 0420)  Labs:  Recent Labs  05/06/16 1234 05/07/16 0246 05/08/16 0257 05/09/16 0250  HGB  --  11.5*  --   --   HCT  --  36.2*  --   --   PLT  --  161  --   --   CREATININE 1.24 1.16 1.13 1.19  TROPONINI 0.05*  --   --   --     Estimated Creatinine Clearance: 46.2 mL/min (by C-G formula based on Cr of 1.19).   Medications:  Scheduled:  . aspirin EC  81 mg Oral Daily  . atorvastatin  80 mg Oral Daily  . furosemide  40 mg Intravenous BID  . metoprolol tartrate  25 mg Oral BID  . mometasone-formoterol  2 puff Inhalation BID  . sodium chloride flush  3 mL Intravenous Q12H  . tiotropium  18 mcg Inhalation Daily    Assessment: 65 yo male with afib on apixiban and noted with large pleural effusions. Possible plans for PlurX catheter and pharmacy has been consulted to dose heparin.  Last apixiban dose was today at ~ 9:30am -Hg= 11.5, plt= 161  Goal of Therapy:  Heparin level 0.3-0.7 units/ml aPTT 66-102 seconds Monitor platelets by anticoagulation protocol: Yes   Plan:  -Begin heparin at 7000 units/hr at 10pm -Heparin level and aPTT in 8 hrs -CBC, heparin level and aPTT daily  Harland GermanAndrew Soraida Vickers, Pharm D 05/09/2016 10:44 AM

## 2016-05-09 NOTE — Progress Notes (Addendum)
Patient Name: Craig [name] Date of Encounter: 05/09/2016  Active Problems:   Dyspnea   Primary Cardiologist: Dr.McAlhany Patient Profile: 65 yo male with history of CAD s/p CABG, severe aortic stenosis s/p AVR, thoracic aortic aneurysm s/p root replacement, COPD, HTN, Afib, HLD, and tobacco abuse. He underwent 4 V CABG, AVR with pericardial valve and aortic root replacement 01/07/16. He was admitted for hypoxia, chest CT ruled out PE, showed large bilateral pleural effusions.   SUBJECTIVE: Feels well, still complains of SOB at rest. Denies chest pain or palpitations.    OBJECTIVE Filed Vitals:   05/08/16 1006 05/08/16 1508 05/08/16 2046 05/09/16 0420  BP:  98/62 104/80 98/66  Pulse:  56 97 88  Temp:  98.1 F (36.7 C) 98.1 F (36.7 C) 97.7 F (36.5 C)  TempSrc:  Oral Oral Oral  Resp:  18 17 19   Height:      Weight:    114 lb 12.8 oz (52.073 kg)  SpO2: 94% 95% 96% 94%    Intake/Output Summary (Last 24 hours) at 05/09/16 0830 Last data filed at 05/09/16 0420  Gross per 24 hour  Intake    600 ml  Output   1675 ml  Net  -1075 ml   Filed Weights   05/07/16 0355 05/08/16 0600 05/09/16 0420  Weight: 120 lb 12.8 oz (54.795 kg) 115 lb 11.2 oz (52.481 kg) 114 lb 12.8 oz (52.073 kg)    PHYSICAL EXAM General: Well developed, well nourished, male in no acute distress. Head: Normocephalic, atraumatic.  Neck: Supple without bruits,no JVD. Lungs:  Resp regular and unlabored, diminished throughout, more so in the bases.  Heart: IRRR, S1, S2, no S3, S4, or murmur; no rub. Abdomen: Soft, non-tender, non-distended, BS + x 4.  Extremities: No clubbing, cyanosis, No edema.  Neuro: Alert and oriented X 3. Moves all extremities spontaneously. Psych: Normal affect.  LABS: CBC: Recent Labs  05/07/16 0246  WBC 7.5  HGB 11.5*  HCT 36.2*  MCV 95.5  PLT 161   Basic Metabolic Panel: Recent Labs  05/06/16 1234  05/08/16 0257 05/09/16 0250  NA 140  < > 137 137  K 3.2*  < >  3.6 4.5  CL 102  < > 100* 99*  CO2 28  < > 30 34*  GLUCOSE 107*  < > 112* 119*  BUN 20  < > 19 19  CREATININE 1.24  < > 1.13 1.19  CALCIUM 9.3  < > 8.7* 9.1  MG 1.8  --   --   --   < > = values in this interval not displayed. Liver Function Tests: Recent Labs  05/06/16 1234  AST 32  ALT 30  ALKPHOS 114  BILITOT 0.9  PROT 6.9  ALBUMIN 3.0*   Cardiac Enzymes: Recent Labs  05/06/16 1234  TROPONINI 0.05*   BNP:  B NATRIURETIC PEPTIDE  Date/Time Value Ref Range Status  05/06/2016 12:33 PM 589.9* 0.0 - 100.0 pg/mL Final  12/27/2015 05:03 PM 1306.4* 0.0 - 100.0 pg/mL Final   D-dimer: Recent Labs  05/06/16 1234  DDIMER 6.16*   Fasting Lipid Panel: Recent Labs  05/07/16 0246  CHOL 121  HDL 42  LDLCALC 59  TRIG 100  CHOLHDL 2.9   Thyroid Function Tests: Recent Labs  05/06/16 1233  TSH 2.799     Current facility-administered medications:  .  0.9 %  sodium chloride infusion, 250 mL, Intravenous, PRN, Kathleene Hazelhristopher D McAlhany, MD .  acetaminophen (TYLENOL) tablet 650  mg, 650 mg, Oral, Q4H PRN, Kathleene Hazel, MD .  apixaban Everlene Balls) tablet 5 mg, 5 mg, Oral, BID, Kathleene Hazel, MD, 5 mg at 05/08/16 2039 .  aspirin EC tablet 81 mg, 81 mg, Oral, Daily, Kathleene Hazel, MD, 81 mg at 05/08/16 0908 .  atorvastatin (LIPITOR) tablet 80 mg, 80 mg, Oral, Daily, Kathleene Hazel, MD, 80 mg at 05/08/16 0907 .  furosemide (LASIX) injection 40 mg, 40 mg, Intravenous, BID, Lennart Pall Stanton, [place], 40 mg at 05/08/16 1709 .  metoprolol tartrate (LOPRESSOR) tablet 25 mg, 25 mg, Oral, BID, Kathleene Hazel, MD, 25 mg at 05/08/16 2039 .  mometasone-formoterol (DULERA) 200-5 MCG/ACT inhaler 2 puff, 2 puff, Inhalation, BID, Kathleene Hazel, MD, 2 puff at 05/08/16 1006 .  nitroGLYCERIN (NITROSTAT) SL tablet 0.4 mg, 0.4 mg, Sublingual, Q5 Min x 3 PRN, Kathleene Hazel, MD .  ondansetron [facility] [address] [facility] [address]) injection 4 mg, 4 mg, Intravenous, Q6H PRN,  Kathleene Hazel, MD .  sodium chloride flush (NS) 0.9 % injection 3 mL, 3 mL, Intravenous, Q12H, Kathleene Hazel, MD, 3 mL at 05/08/16 2041 .  sodium chloride flush (NS) 0.9 % injection 3 mL, 3 mL, Intravenous, PRN, Kathleene Hazel, MD .  tiotropium [facility]) inhalation capsule 18 mcg, 18 mcg, Inhalation, Daily, Kathleene Hazel, MD, 18 mcg at 05/08/16 1006    TELE: Afib        ECG: Afib, ST depression in anterolateral leads.    Transthoracic Echocardiography 05/07/16 Study Conclusions  - Left ventricle: The cavity size was normal. Wall thickness was  increased in a pattern of mild LVH. Systolic function was normal.  The estimated ejection fraction was in the range of 55% to 60%.  Wall motion was normal; there were no regional wall motion  abnormalities. - Ventricular septum: The contour showed diastolic flattening and  systolic flattening. - Aortic valve: A bioprosthesis was present. There was trivial  regurgitation. - Mitral valve: There was mild regurgitation. - Left atrium: The atrium was mildly dilated. - Pericardium, extracardiac: A small pericardial effusion was  identified. There was a left pleural effusion.  Impressions:  - Normal LV systolic function; mild LVH; septal flattening  suggestive of pulmonary hypertension; miild LAE; s/p AVR with  normal gradient and trace AI; mild MR.  Current Medications:  . apixaban  5 mg Oral BID  . aspirin EC  81 mg Oral Daily  . atorvastatin  80 mg Oral Daily  . furosemide  40 mg Intravenous BID  . metoprolol tartrate  25 mg Oral BID  . mometasone-formoterol  2 puff Inhalation BID  . sodium chloride flush  3 mL Intravenous Q12H  . tiotropium  18 mcg Inhalation Daily      ASSESSMENT AND PLAN: Active Problems:   Dyspnea  1. Dyspnea/hypoxia: Likely multifactorial. He has significant COPD. However his chest CT shows large bilateral pleural effusions which is likely contributing. Unclear if  this is CHF or inflammatory reaction due to recent surgery.   He has gotten 3 days of IV diuresis, still says his breathing has not improved, he feels SOB at rest. Weight down one pound from yesterday, 7 pounds from admission.   Dr. Jens Som has discussed the patient with Dr. Dorris Fetch. The patient did have a right Pleurx catheter removed recently. He may need repeat Pleurx catheters/pleurodesis and we will hold apixaban if necessary. Dr Laneta Simmers to review this AM. Note d-dimer was elevated but CT showed no pulmonary embolus. Echo shows normally  functioning aortic valve and normal LV function.   2. Persistent atrial fibrillation- He is rate controlled, continue metoprolol; continue apixaban for now unless procedures needed. This could be contributing to his CHF. If no procedures planned will proceed with TEE guided cardioversion.   3. 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 Note he does not appear to be markedly volume overloaded on examination.  4. HTN: Blood pressure controlled. Continue on current medication for now.   5. HLD: His Lipitor was stopped due to elevated transaminase in the setting of shock liver and hypotension, last LFT was normal, Lipitor has been resumed.   6. Severe AS: s/p aortic valve replacement using 25 mm Edwards Magna-ease pericardial valve march 2017  7. Thoracic aortic aneurysm: Dilated ascending aorta s/p supra-coronary ascending aortic and hemi-arch replacement using a 30 mm Hemashield per Dr. Laneta SimmersBartle in March 2017.    Signed, Little IshikawaErin E Smith , NP 8:30 AM 05/09/2016 Pager [phone]  I have personally seen and examined this patient with Suzzette RighterErin Smith, NP. I agree with the assessment and plan as outlined above. He has had very little symptomatic improvement in dyspnea with diuresis despite being down 4 Liters since admission. He has large bilateral pleural effusions that occurred post-operatively. He had  required previous PleurX catheter placement per Dr. Laneta SimmersBartle. I have asked Dr. Laneta SimmersBartle to see him today. Will repeat 4 view chest x-ray today. He continue to have atrial fibrillation which could be contributing some to his dyspnea. Cannot plan TEE guided DCCV until we are sure he will not need to interrupt anti-coagulation for surgical procedure. My exam today shows a thin male with no LE edema, crackles bases of both lungs, irreg irreg. Vitals reviewed. Labs reviewed.   Verne CarrowChristopher McAlhany 05/09/2016 9:16 AM

## 2016-05-09 NOTE — Progress Notes (Signed)
Patient lying in bed, no needs at this time. Call light within reach. 

## 2016-05-09 NOTE — Consult Note (Signed)
[address]       [place] [address]             [phone]      Cardiothoracic Surgery Consultation  Reason for Consult: Recurrent bilateral pleural effusions with shortness of breath Referring Physician: Dr. Darlina Guys  Craig [name] is an 65 y.o. male.  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  He had a slow complicated postop course with respiratory failure requiring reintubation twice. He had chronic problems since surgery with bilateral pleural effusions that have been symptomatic. He had such marginal lung function that even small to moderate effusions were causing respiratory decline postop. He had repeated thoracentesis and eventually insertion of bilateral pleurX catheters on 01/25/2016. These had been drained at a SNF initially and then at home by the patient and his sister. The left one was removed on 5/11 and the right one on 6/9. He looked great at that point and breathing was at his baseline. He now presents with recurrent shortness of breath that he says started a few days after the second catheter was removed. He says that he gets short of breath with any activity at all. A CXR on admission showed small bilateral pleural effusions and did not look any different than his last CXR. A CTA ruled out PE ( he has been on Eliquis for atrial fib). It did show moderate bilateral pleural effusions with mild bibasilar atelectasis. The radiologist called the effusions large but I think they are moderate at best. His sat on room air was 77% on admission. His BNP was 500's and troponin negative. He was diuresed 4L without improvement in symptoms. Echo showed EF 55-60% with normal wall motion. Valves functioning fine.  Past Medical History  Diagnosis Date  . COPD (chronic obstructive  pulmonary disease) (HCC)     severe on PFT prior to CABG  . Hypertension   . Hyperlipidemia   . Alcohol abuse     Prior  . Tobacco abuse     Prior  . CAD (coronary artery disease) of bypass graft      4v CABG (LIMA to LAD, SVG to OM, seq SVG to PDA/PLA) by Dr. Cyndia Bent 01/07/2016  . dilated ascending aorta     s/p supra-coronary ascending aortic and hemi-arch replacement using a 30 mm Hemashield under deep hypothermic circulatory arrest during CABG 01/07/2016  . Severe aortic stenosis     s/p aortic valve replacement using 25 mm Edwards Magna-ease pericardial valve by Dr. Cyndia Bent during CABG 01/07/2016  . Persistent atrial fibrillation (HCC)     in the setting of NSTEMI and persisted after [facility] 01/2016  . Recurrent left pleural effusion     s/p PleurX drain, occured after CABG  . Recurrent right pleural effusion     s/p PleurX drain, occured after CABG    Past Surgical History  Procedure Laterality Date  . Ruptured disc repair  2009 or 2010  . Cardiac catheterization N/A 12/29/2015    Procedure: Right/Left Heart Cath and Coronary Angiography;  Surgeon: Peter M Martinique, MD;  Location: [place];  Service: Cardiovascular;  Laterality: N/A;  . Coronary artery bypass graft N/A 01/07/2016    Procedure: CORONARY ARTERY BYPASS GRAFTING (CABG), ON PUMP, TIMES FOUR, USING LEFT INTERNAL MAMMARY ARTERY,  BILATERAL GREATER SAPHENOUS VEINS HARVESTED ENDOSCOPICALLY;  Surgeon: Gaye Pollack, MD;  Location: [facility];  Service: Open Heart Surgery;  Laterality: N/A;  LIMA to LAD, SVG to OM, SVG SEQUENTIALLY to PDA and PLB  . Tee without cardioversion N/A 01/07/2016    Procedure: TRANSESOPHAGEAL ECHOCARDIOGRAM (TEE);  Surgeon: Gaye Pollack, MD;  Location: [facility];  Service: Open Heart Surgery;  Laterality: N/A;  . Thoracic aortic aneurysm repair N/A 01/07/2016    Procedure: CIRC ARREST AND REPLACEMENT OF ASCENDING THORACIC  ANEURYSM;  Surgeon: Gaye Pollack, MD;  Location: [facility];  Service: Open Heart Surgery;   Laterality: N/A;  . Chest tube insertion Bilateral 01/25/2016    Procedure: INSERTION Bilateral PLEURAL DRAINAGE CATHETER;  Surgeon: Gaye Pollack, MD;  Location: [facility];  Service: Thoracic;  Laterality: Bilateral;  . Removal of pleural drainage catheter Left 03/17/2016    Procedure: REMOVAL OF PLEURAL DRAINAGE CATHETER;  Surgeon: Gaye Pollack, MD;  Location: [facility];  Service: Thoracic;  Laterality: Left;    Family History  Problem Relation Age of Onset  . Diabetes Father   . Heart attack Neg Hx   . Stroke Mother   . Stroke Father   . Kidney disease Father   . Kidney disease Sister   . Kidney disease Brother     Social History:  reports that he has quit smoking. He has never used smokeless tobacco. He reports that he does not drink alcohol or use illicit drugs.  Allergies: No Known Allergies  Medications:  I have reviewed the patient's current medications. Prior to Admission:  Prescriptions prior to admission  Medication Sig Dispense Refill Last Dose  . apixaban (ELIQUIS) 5 MG TABS tablet Take 1 tablet (5 mg total) by mouth 2 (two) times daily. 60 tablet 5 05/06/2016 at 0745  . aspirin 81 MG EC tablet Take 1 tablet (81 mg total) by mouth daily. 30 tablet 1 05/06/2016 at Unknown time  . atorvastatin (LIPITOR) 80 MG tablet Take 1 tablet (80 mg total) by mouth daily. 90 tablet 3 05/06/2016 at Unknown time  . budesonide-formoterol (SYMBICORT) 160-4.5 MCG/ACT inhaler Inhale 2 puffs into the lungs 2 (two) times daily as needed (For shortness of breath.).    05/06/2016 at Unknown time  . furosemide (LASIX) 40 MG tablet Take 1 tablet (40 mg total) by mouth daily. 90 tablet 3 05/06/2016 at Unknown time  . metoprolol tartrate (LOPRESSOR) 25 MG tablet Take 1 tablet (25 mg total) by mouth 2 (two) times daily. 180 tablet 3 05/06/2016 at 0745  . sodium chloride (OCEAN) 0.65 % SOLN nasal spray Place 1 spray into both nostrils as needed for congestion.   prn  . tiotropium (SPIRIVA) 18 MCG inhalation  capsule Place 18 mcg into inhaler and inhale daily as needed (For shortness of breath.).    05/06/2016 at Unknown time   Scheduled: . aspirin EC  81 mg Oral Daily  . atorvastatin  80 mg Oral Daily  . furosemide  40 mg Intravenous BID  . metoprolol tartrate  25 mg Oral BID  . mometasone-formoterol  2 puff Inhalation BID  . sodium chloride flush  3 mL Intravenous Q12H  . tiotropium  18 mcg Inhalation Daily   Continuous: . heparin     YNW:GNFAOZ chloride, acetaminophen, nitroGLYCERIN, ondansetron (ZOFRAN) IV, sodium chloride flush Anti-infectives    None      Results for orders placed or performed during the hospital encounter of 05/06/16 (from the past 48 hour(s))  Basic  metabolic panel     Status: Abnormal   Collection Time: 05/08/16  2:57 AM  Result Value Ref Range   Sodium 137 135 - 145 mmol/L   Potassium 3.6 3.5 - 5.1 mmol/L   Chloride 100 (L) 101 - 111 mmol/L   CO2 30 22 - 32 mmol/L   Glucose, Bld 112 (H) 65 - 99 mg/dL   BUN 19 6 - 20 mg/dL   Creatinine, Ser 1.13 0.61 - 1.24 mg/dL   Calcium 8.7 (L) 8.9 - 10.3 mg/dL   GFR calc non Af Amer >60 >60 mL/min   GFR calc Af Amer >60 >60 mL/min    Comment: (NOTE) The eGFR has been calculated using the CKD EPI equation. This calculation has not been validated in all clinical situations. eGFR's persistently <60 mL/min signify possible Chronic Kidney Disease.    Anion gap 7 5 - 15  Basic metabolic panel     Status: Abnormal   Collection Time: 05/09/16  2:50 AM  Result Value Ref Range   Sodium 137 135 - 145 mmol/L   Potassium 4.5 3.5 - 5.1 mmol/L    Comment: DELTA CHECK NOTED   Chloride 99 (L) 101 - 111 mmol/L   CO2 34 (H) 22 - 32 mmol/L   Glucose, Bld 119 (H) 65 - 99 mg/dL   BUN 19 6 - 20 mg/dL   Creatinine, Ser 1.19 0.61 - 1.24 mg/dL   Calcium 9.1 8.9 - 10.3 mg/dL   GFR calc non Af Amer >60 >60 mL/min   GFR calc Af Amer >60 >60 mL/min    Comment: (NOTE) The eGFR has been calculated using the CKD EPI equation. This  calculation has not been validated in all clinical situations. eGFR's persistently <60 mL/min signify possible Chronic Kidney Disease.    Anion gap 4 (L) 5 - 15    Dg Chest 4 View  05/09/2016  CLINICAL DATA:  Bilateral pleural effusion EXAM: CHEST - 4+ VIEW COMPARISON:  05/06/2016 chest radiograph. FINDINGS: Sternotomy wires appear aligned and intact. Cardiac valvular prosthesis is in place. Surgical clips overlie the left axilla. Stable cardiomediastinal silhouette with normal heart size. No pneumothorax. Small bilateral pleural effusions appear stable. No overt pulmonary edema. Patchy bibasilar consolidation appears stable. IMPRESSION: 1. Stable small bilateral pleural effusions. 2. Stable patchy bibasilar lung consolidation, favor atelectasis. Electronically Signed   By: Ilona Sorrel M.D.   On: 05/09/2016 10:23    Review of Systems  Constitutional: Positive for malaise/fatigue. Negative for fever, chills and weight loss.  HENT: Negative.   Eyes: Negative.   Respiratory: Positive for cough, sputum production, shortness of breath and wheezing. Negative for hemoptysis.   Cardiovascular: Positive for palpitations and orthopnea. Negative for chest pain.       Mild ankle edema  Gastrointestinal: Negative.   Genitourinary: Negative.   Musculoskeletal: Negative.   Skin: Negative.   Neurological: Negative.   Endo/Heme/Allergies: Negative.   Psychiatric/Behavioral: Negative.    Blood pressure 98/66, pulse 88, temperature 97.7 F (36.5 C), temperature source Oral, resp. rate 19, height _0  (1.753 m), weight 52.073 kg (114 lb 12.8 oz), SpO2 95 %. Physical Exam  Constitutional: He is oriented to person, place, and time.  Thin, chronically-ill appearing in no distress  HENT:  Head: Normocephalic and atraumatic.  Mouth/Throat: Oropharynx is clear and moist.  Eyes: EOM are normal. Pupils are equal, round, and reactive to light.  Neck: Normal range of motion. Neck supple. No JVD present.    Cardiovascular: Normal heart sounds.  No murmur heard. Irregularly irregular  Respiratory: Effort normal and breath sounds normal. He has no wheezes.  Decreased breath sounds both bases  GI: Soft. Bowel sounds are normal. He exhibits no distension. There is no tenderness.  Musculoskeletal: Normal range of motion. He exhibits no edema.  Lymphadenopathy:    He has no cervical adenopathy.  Neurological: He is alert and oriented to person, place, and time. He has normal strength. No cranial nerve deficit or sensory deficit.  Skin: Skin is warm and dry.  Psychiatric: He has a normal mood and affect.   Result status: Final result      *[facility]*  [address], [place] [address]  [phone]  ------------------------------------------------------------------- Transthoracic Echocardiography  Patient: Burnham, Trost MR #: [id number] Study Date: 05/07/2016 Gender: M Age: 63 Height: 175.3 cm Weight: 54.8 kg BSA: 1.62 m^2 Pt. Status: Room: 2W33C  SONOGRAPHER Johny Chess, RDCS, CCT ADMITTING Lauree Chandler ATTENDING Angelena Form, Gilmore Laroche, [place], Inpatient  cc:  ------------------------------------------------------------------- LV EF: 55% - 60%  ------------------------------------------------------------------- Indications: Dyspnea 786.09.  ------------------------------------------------------------------- History: PMH: Chronic obstructive pulmonary disease. Risk factors: Status post Aortic valve and root replacement. Hypertension. Dyslipidemia.  ------------------------------------------------------------------- Study Conclusions  - Left ventricle: The cavity size was  normal. Wall thickness was  increased in a pattern of mild LVH. Systolic function was normal.  The estimated ejection fraction was in the range of 55% to 60%.  Wall motion was normal; there were no regional wall motion  abnormalities. - Ventricular septum: The contour showed diastolic flattening and  systolic flattening. - Aortic valve: A bioprosthesis was present. There was trivial  regurgitation. - Mitral valve: There was mild regurgitation. - Left atrium: The atrium was mildly dilated. - Pericardium, extracardiac: A small pericardial effusion was  identified. There was a left pleural effusion.  Impressions:  - Normal LV systolic function; mild LVH; septal flattening  suggestive of pulmonary hypertension; miild LAE; s/p AVR with  normal gradient and trace AI; mild MR.  ------------------------------------------------------------------- Labs, prior tests, procedures, and surgery: Coronary artery bypass grafting.  Transthoracic echocardiography. M-mode, complete 2D, spectral Doppler, and color Doppler. Birthdate: Patient birthdate: July 28, 1951. Age: Patient is 65 yr old. Sex: Gender: male. BMI: 17.8 kg/m^2. Blood pressure: 122/73 Patient status: Inpatient. Study date: Study date: 05/07/2016. Study time: 02:35 PM. Location: Bedside.  -------------------------------------------------------------------  ------------------------------------------------------------------- Left ventricle: The cavity size was normal. Wall thickness was increased in a pattern of mild LVH. Systolic function was normal. The estimated ejection fraction was in the range of 55% to 60%. Wall motion was normal; there were no regional wall motion abnormalities. The study was not technically sufficient to allow evaluation of LV diastolic dysfunction due to atrial fibrillation.  ------------------------------------------------------------------- Aortic valve: Normal  thickness leaflets. A bioprosthesis was present. Mobility was not restricted. Doppler: Transvalvular velocity was within the normal range. There was no stenosis. There was trivial regurgitation. VTI ratio of LVOT to aortic valve: 0.78. Indexed valve area (VTI): 1.82 cm^2/m^2. Peak velocity ratio of LVOT to aortic valve: 0.67. Indexed valve area (Vmax): 1.58 cm^2/m^2. Mean velocity ratio of LVOT to aortic valve: 0.69. Indexed valve area (Vmean): 1.63 cm^2/m^2. Mean gradient (S): 2 mm Hg. Peak gradient (S): 5 mm Hg.  ------------------------------------------------------------------- Aorta: Aortic root: The aortic root was normal in size.  ------------------------------------------------------------------- Mitral valve: Structurally normal valve. Mobility was not restricted. Doppler: Transvalvular velocity was within the normal range. There was no evidence for stenosis. There was mild regurgitation.  -------------------------------------------------------------------  Left atrium: The atrium was mildly dilated.  ------------------------------------------------------------------- Right ventricle: The cavity size was normal. Systolic function was normal.  ------------------------------------------------------------------- Ventricular septum: The contour showed diastolic flattening and systolic flattening.  ------------------------------------------------------------------- Pulmonic valve: Doppler: Transvalvular velocity was within the normal range. There was no evidence for stenosis.  ------------------------------------------------------------------- Tricuspid valve: Structurally normal valve. Doppler: Transvalvular velocity was within the normal range. There was trivial regurgitation.  ------------------------------------------------------------------- Right atrium: The atrium was normal in  size.  ------------------------------------------------------------------- Pericardium: A small pericardial effusion was identified.  ------------------------------------------------------------------- Systemic veins: Inferior vena cava: The vessel was normal in size.  ------------------------------------------------------------------- Pleura: There was a left pleural effusion.  ------------------------------------------------------------------- Measurements  Left ventricle Value Reference LV ID, ED, PLAX chordal (L) 35.2 mm 43 - 52 LV ID, ES, PLAX chordal 23.4 mm 23 - 38 LV fx shortening, PLAX chordal 34 % >=29 LV PW thickness, ED 11.58 mm --------- IVS/LV PW ratio, ED 1.01 <=1.3 Stroke volume, 2D 59 ml --------- Stroke volume/bsa, 2D 36 ml/m^2 ---------  Ventricular septum Value Reference IVS thickness, ED 11.73 mm ---------  LVOT Value Reference LVOT ID, S 22 mm --------- LVOT area 3.8 cm^2 --------- LVOT peak velocity, S 78.1 cm/s --------- LVOT mean velocity, S 50.1 cm/s --------- LVOT VTI, S 15.5 cm ---------  Aortic valve Value Reference Aortic valve peak velocity, S 116 cm/s --------- Aortic valve mean velocity, S 72.3 cm/s --------- Aortic valve VTI, S 20 cm --------- Aortic mean gradient, S 2 mm Hg --------- Aortic peak  gradient, S 5 mm Hg --------- VTI ratio, LVOT/AV 0.78 --------- Aortic valve area/bsa, VTI 1.82 cm^2/m^2 --------- Velocity ratio, peak, LVOT/AV 0.67 --------- Aortic valve area/bsa, peak 1.58 cm^2/m^2 --------- velocity Velocity ratio, mean, LVOT/AV 0.69 --------- Aortic valve area/bsa, mean 1.63 cm^2/m^2 --------- velocity  Aorta Value Reference Aortic root ID, ED 34 mm ---------  Left atrium Value Reference LA ID, A-P, ES 39 mm --------- LA ID/bsa, A-P (H) 2.41 cm/m^2 <=2.2 LA volume, S 54.2 ml --------- LA volume/bsa, S 33.5 ml/m^2 --------- LA volume, ES, 1-p A4C 50.4 ml --------- LA volume/bsa, ES, 1-p A4C 31.1 ml/m^2 --------- LA volume, ES, 1-p A2C 53 ml --------- LA volume/bsa, ES, 1-p A2C 32.7 ml/m^2 ---------  Systemic veins Value Reference Estimated CVP 3 mm Hg ---------  Legend: (L) and (H) mark values outside specified reference range.  ------------------------------------------------------------------- Prepared and Electronically Authenticated by  Kirk Ruths 2017-07-01T16:10:57   CLINICAL DATA: Shortness of breath for 3 days  EXAM: CT ANGIOGRAPHY CHEST WITH CONTRAST  TECHNIQUE: Multidetector CT imaging of the chest was performed using the standard protocol during bolus administration of intravenous contrast. Multiplanar CT image reconstructions and MIPs were obtained to evaluate  the vascular anatomy.  CONTRAST: 80 mL Isovue 370  COMPARISON: Film from earlier in the same day, CT from 01/11/2016  FINDINGS: Mediastinum/Lymph Nodes: No significant hilar or mediastinal adenopathy is identified.  Cardiovascular: The thoracic aorta demonstrates atherosclerotic calcifications although no definitive aneurysmal dilatation is seen. Changes of prior coronary bypass grafting are noted. Pulmonary artery demonstrates a normal branching pattern. No filling defects to suggest pulmonary emboli are identified. Coronary calcifications are noted. No right heart strain is noted. Postsurgical changes in the aorta are again seen. Previously seen periaortic hemorrhage is no longer identified.  Lungs/Pleura: Emphysematous changes are again seen. Large bilateral pleural effusions are noted. Some basilar atelectasis is noted likely related to the effusions. No pneumothorax is noted.  Upper abdomen: 3 mm nonobstructing right renal stone is noted. No other focal abnormality is  noted in the upper abdomen.  Musculoskeletal: Degenerative change of thoracic spine is noted.  Review of the MIP images confirms the above findings.  IMPRESSION: No evidence of pulmonary emboli.  Large bilateral pleural effusions which have increased in the interval from the prior exam. Mild lower lobe atelectasis is noted bilaterally improved from the prior study.  The previously seen periaortic hemorrhage has resolved in the interval.   Electronically Signed  By: Inez Catalina M.D.  On: 05/06/2016 16:07  Assessment/Plan:  I think his shortness of breath is multi-factorial as it always has been. He has severe COPD with an FEV1 of 1.2 (35% predicted) and a DLCO of 28% predicted. He also has chronic debilitation from his surgery that he has not fully recovered from yet, atrial fibrillation and probably some diastolic heart failure. His effusions are in the small to moderate range but  are probably large enough to put him over the edge. He was much better immediately after the PleurX catheters were inserted before and that allowed him to ambulate, eat and progress. Unfortunately they have recurred after the catheters were removed. I think the effusions are also multifactorial due to the postop inflammatory state, diastolic heart failure, previous severe malnutrition with low albumin. I think the best option is probably to try to reinsert the PleurX catheters and perform pleurodesis with doxycycline since Talc is not available. It may not be possible to insert the catheters again but it is worth a try. There is really nothing else to offer him and this did help him before. I discussed the procedure with him including alternatives, benefits and risks including but not limited to bleeding, infection, lung injury, inability to place the catheters, catheter malfunction and recurrent effusions and he says that he understands and agrees to proceed. We will stop the Eliquis today and plan to do Wednesday morning.  Gaye Pollack 05/09/2016, 12:49 PM

## 2016-05-10 LAB — CBC
HCT: 35.9 % — ABNORMAL LOW (ref 39.0–52.0)
Hemoglobin: 11.3 g/dL — ABNORMAL LOW (ref 13.0–17.0)
MCH: 30 pg (ref 26.0–34.0)
MCHC: 31.5 g/dL (ref 30.0–36.0)
MCV: 95.2 fL (ref 78.0–100.0)
Platelets: 173 10*3/uL (ref 150–400)
RBC: 3.77 MIL/uL — ABNORMAL LOW (ref 4.22–5.81)
RDW: 17.4 % — AB (ref 11.5–15.5)
WBC: 7.3 10*3/uL (ref 4.0–10.5)

## 2016-05-10 LAB — APTT
APTT: 82 s — AB (ref 24–37)
aPTT: 81 seconds — ABNORMAL HIGH (ref 24–37)

## 2016-05-10 LAB — HEPARIN LEVEL (UNFRACTIONATED): HEPARIN UNFRACTIONATED: 1.8 [IU]/mL — AB (ref 0.30–0.70)

## 2016-05-10 MED ORDER — FUROSEMIDE 40 MG PO TABS
40.0000 mg | ORAL_TABLET | Freq: Every day | ORAL | Status: DC
  Administered 2016-05-10 – 2016-05-12 (×2): 40 mg via ORAL
  Filled 2016-05-10 (×2): qty 1

## 2016-05-10 NOTE — Anesthesia Preprocedure Evaluation (Addendum)
Anesthesia Evaluation  Patient identified by MRN, date of birth, ID band Patient awake    Reviewed: Allergy & Precautions, H&P , NPO status , Patient's Chart, lab work & pertinent test results, reviewed documented beta blocker date and time   Airway Mallampati: II  TM Distance: >3 FB Neck ROM: Full    Dental no notable dental hx. (+) Edentulous Upper, Partial Lower, Dental Advisory Given   Pulmonary COPD,  COPD inhaler, former smoker,    Pulmonary exam normal breath sounds clear to auscultation       Cardiovascular hypertension, Pt. on medications and Pt. on home beta blockers + CAD, + Past MI, + CABG and + Peripheral Vascular Disease  + dysrhythmias Atrial Fibrillation  Rhythm:Regular Rate:Normal     Neuro/Psych negative neurological ROS  negative psych ROS   GI/Hepatic negative GI ROS, Neg liver ROS,   Endo/Other  negative endocrine ROS  Renal/GU negative Renal ROS  negative genitourinary   Musculoskeletal   Abdominal   Peds  Hematology negative hematology ROS (+) anemia ,   Anesthesia Other Findings   Reproductive/Obstetrics negative OB ROS                            Anesthesia Physical Anesthesia Plan  ASA: III  Anesthesia Plan: MAC   Post-op Pain Management:    Induction: Intravenous  Airway Management Planned: Simple Face Mask  Additional Equipment:   Intra-op Plan:   Post-operative Plan:   Informed Consent: I have reviewed the patients History and Physical, chart, labs and discussed the procedure including the risks, benefits and alternatives for the proposed anesthesia with the patient or authorized representative who has indicated his/her understanding and acceptance.   Dental advisory given  Plan Discussed with: CRNA  Anesthesia Plan Comments:         Anesthesia Quick Evaluation

## 2016-05-10 NOTE — Progress Notes (Signed)
     SUBJECTIVE: No chest pain. Still with dyspnea.   Tele: atrial fib  BP 91/61 mmHg  Pulse 92  Temp(Src) 97.6 F (36.4 C) (Oral)  Resp 19  Ht 5\' 9"  (1.753 m)  Wt 114 lb 4.8 oz (51.846 kg)  BMI 16.87 kg/m2  SpO2 93%  Intake/Output Summary (Last 24 hours) at 05/10/16 0737 Last data filed at 05/10/16 0403  Gross per 24 hour  Intake    720 ml  Output   1925 ml  Net  -1205 ml    PHYSICAL EXAM General: Thin male, no acute distress. Alert and oriented x 3.  Psych:  Good affect, responds appropriately Neck: No JVD. No masses noted.  Lungs: Clear bilaterally with basilar crackles. No wheezes or rhonci noted.  Heart: Irreg irreg with no murmurs noted. Abdomen: Bowel sounds are present. Soft, non-tender.  Extremities: No lower extremity edema.   LABS: Basic Metabolic Panel:  Recent Labs  96/02/5406/02/17 0257 05/09/16 0250  NA 137 137  K 3.6 4.5  CL 100* 99*  CO2 30 34*  GLUCOSE 112* 119*  BUN 19 19  CREATININE 1.13 1.19  CALCIUM 8.7* 9.1   CBC:  Recent Labs  05/10/16 0601  WBC 7.3  HGB 11.3*  HCT 35.9*  MCV 95.2  PLT 173    Current Meds: . aspirin EC  81 mg Oral Daily  . atorvastatin  80 mg Oral Daily  . furosemide  40 mg Intravenous BID  . metoprolol tartrate  25 mg Oral BID  . mometasone-formoterol  2 puff Inhalation BID  . sodium chloride flush  3 mL Intravenous Q12H  . tiotropium  18 mcg Inhalation Daily     ASSESSMENT AND PLAN:  1. Dyspnea/hypoxia: Likely multifactorial with underlying but it is felt that the acute change is due to his recurrent pleural effusions. He has diuresed 5.1 liters since admission with no improvement in his dyspnea and does not appear to be volume overloaded at this time. Dr. Laneta SimmersBartle is planning PleurX catheter placement on Wednesday am with pleuorodesis. Eliquis on hold for this procedure. Will d/c IV Lasix today. Resume po Lasix.   2. Persistent atrial fibrillation: He is rate controlled, continue metoprolol. Eliquis on hold  for procedure. He is on heparin drip. Cannot cardiovert right now as anti-coagulation may have to be interrupted over next month for other procedures.    3. Acute on chronic systolic/diastolic congestive heart failure: Volume status is ok. Change to po Lasix today.  4. HTN: Blood pressure controlled. Continue on current medication for now.   5. HLD: His Lipitor was stopped due to elevated transaminase in the setting of shock liver and hypotension, last LFT was normal, Lipitor has been resumed.   6. Severe AS: s/p aortic valve replacement using 25 mm Edwards Magna-ease pericardial valve march 2017  7. Thoracic aortic aneurysm: Dilated ascending aorta s/p supra-coronary ascending aortic and hemi-arch replacement using a 30 mm Hemashield per Dr. Laneta SimmersBartle in March 2017.   Craig [name]  7/4/20177:37 AM

## 2016-05-10 NOTE — Progress Notes (Addendum)
ANTICOAGULATION CONSULT NOTE - Follow Up Consult  Pharmacy Consult for heparin  Indication: atrial fibrillation  No Known Allergies  Patient Measurements: Height: 5\' 9"  (175.3 cm) Weight: 114 lb 4.8 oz (51.846 kg) IBW/kg (Calculated) : 70.7  Vital Signs: Temp: 97.6 F (36.4 C) (07/04 0401) Temp Source: Oral (07/04 0401) BP: 91/61 mmHg (07/04 0401) Pulse Rate: 92 (07/04 0401)  Labs:  Recent Labs  05/08/16 0257 05/09/16 0250 05/10/16 0601  HGB  --   --  11.3*  HCT  --   --  35.9*  PLT  --   --  173  APTT  --   --  82*  CREATININE 1.13 1.19  --     Estimated Creatinine Clearance: 45.9 mL/min (by C-G formula based on Cr of 1.19).   Assessment: 65 yo male with afib on apixiban and noted with large pleural effusions. Possible plans for PlurX catheter and apixaban on hold - last dose 7/3 0930. Now on heparin. Heparin level elevated (1.8) due to apixaban so utilizing PTT to monitor heparin. PTT 82 sec (therapeutic) on 700 units/hr. CBC stable. No bleeding noted.  Goal of Therapy:  Heparin level 0.3-0.7 units/ml aPTT 66-102 seconds Monitor platelets by anticoagulation protocol: Yes   Plan:  -Continue heparin at 700 units/hr -F/u 6 hr PTT  Christoper Fabianaron Darroll Bredeson, PharmD, BCPS Clinical pharmacist, pager [phone]  05/10/2016 6:57 AM

## 2016-05-10 NOTE — Progress Notes (Signed)
ANTICOAGULATION CONSULT NOTE - Follow Up Consult  Pharmacy Consult for heparin  Indication: atrial fibrillation  No Known Allergies  Patient Measurements: Height: 5\' 9"  (175.3 cm) Weight: 114 lb 4.8 oz (51.846 kg) IBW/kg (Calculated) : 70.7  Vital Signs: Temp: 97.6 F (36.4 C) (07/04 0401) Temp Source: Oral (07/04 0401) BP: 104/65 mmHg (07/04 0928) Pulse Rate: 86 (07/04 0928)  Labs:  Recent Labs  05/08/16 0257 05/09/16 0250 05/10/16 0601 05/10/16 1229  HGB  --   --  11.3*  --   HCT  --   --  35.9*  --   PLT  --   --  173  --   APTT  --   --  82* 81*  HEPARINUNFRC  --   --  1.80*  --   CREATININE 1.13 1.19  --   --     Estimated Creatinine Clearance: 45.9 mL/min (by C-G formula based on Cr of 1.19).   Assessment: 65 yo male with afib on apixiban and noted with large pleural effusions. Plans for PlurX catheter with pleurodesis and apixaban on hold - last dose 7/3 0930. Now on heparin. Heparin level elevated (1.8) due to apixaban so utilizing PTT to monitor heparin. PTT 81 and confirmed at goal 700 units/hr. CBC stable.   Goal of Therapy:  Heparin level 0.3-0.7 units/ml aPTT 66-102 seconds Monitor platelets by anticoagulation protocol: Yes   Plan:  -Continue heparin at 700 units/hr -Daily aPTT, heparin level and CBC  Harland GermanAndrew Tamsin Nader, Pharm D 05/10/2016 1:23 PM

## 2016-05-11 ENCOUNTER — Inpatient Hospital Stay (HOSPITAL_COMMUNITY): Payer: Medicaid Other

## 2016-05-11 ENCOUNTER — Encounter (HOSPITAL_COMMUNITY): Payer: Self-pay | Admitting: Certified Registered Nurse Anesthetist

## 2016-05-11 ENCOUNTER — Encounter (HOSPITAL_COMMUNITY)
Admission: AD | Disposition: A | Discharge status: Home / Self Care | Payer: Self-pay | Source: Ambulatory Visit | Attending: Cardiovascular Disease

## 2016-05-11 ENCOUNTER — Inpatient Hospital Stay (HOSPITAL_COMMUNITY): Payer: Medicaid Other | Admitting: Anesthesiology

## 2016-05-11 ENCOUNTER — Encounter: Payer: Medicaid Other | Admitting: Surgery

## 2016-05-11 DIAGNOSIS — I5033 Acute on chronic diastolic (congestive) heart failure: Secondary | ICD-10-CM

## 2016-05-11 HISTORY — PX: CHEST TUBE INSERTION: SHX231

## 2016-05-11 HISTORY — PX: PLEURADESIS: SHX6030

## 2016-05-11 LAB — CBC
HEMATOCRIT: 35.8 % — AB (ref 39.0–52.0)
Hemoglobin: 11.1 g/dL — ABNORMAL LOW (ref 13.0–17.0)
MCH: 30.2 pg (ref 26.0–34.0)
MCHC: 31 g/dL (ref 30.0–36.0)
MCV: 97.3 fL (ref 78.0–100.0)
PLATELETS: 180 10*3/uL (ref 150–400)
RBC: 3.68 MIL/uL — ABNORMAL LOW (ref 4.22–5.81)
RDW: 17.4 % — AB (ref 11.5–15.5)
WBC: 7.4 10*3/uL (ref 4.0–10.5)

## 2016-05-11 LAB — HEPARIN LEVEL (UNFRACTIONATED): HEPARIN UNFRACTIONATED: 0.84 [IU]/mL — AB (ref 0.30–0.70)

## 2016-05-11 LAB — APTT: aPTT: 76 seconds — ABNORMAL HIGH (ref 24–37)

## 2016-05-11 LAB — BASIC METABOLIC PANEL
ANION GAP: 7 (ref 5–15)
BUN: 25 mg/dL — ABNORMAL HIGH (ref 6–20)
CALCIUM: 9 mg/dL (ref 8.9–10.3)
CO2: 32 mmol/L (ref 22–32)
Chloride: 100 mmol/L — ABNORMAL LOW (ref 101–111)
Creatinine, Ser: 1.35 mg/dL — ABNORMAL HIGH (ref 0.61–1.24)
GFR, EST NON AFRICAN AMERICAN: 54 mL/min — AB (ref >60)
Glucose, Bld: 124 mg/dL — ABNORMAL HIGH (ref 65–99)
Potassium: 4.8 mmol/L (ref 3.5–5.1)
SODIUM: 139 mmol/L (ref 135–145)

## 2016-05-11 LAB — SURGICAL PCR SCREEN
MRSA, PCR: NEGATIVE
Staphylococcus aureus: NEGATIVE

## 2016-05-11 SURGERY — INSERTION, PLEURAL DRAINAGE CATHETER
Anesthesia: Monitor Anesthesia Care | Site: Chest | Laterality: Bilateral

## 2016-05-11 MED ORDER — MIDAZOLAM HCL 5 MG/5ML IJ SOLN
INTRAMUSCULAR | Status: DC | PRN
  Administered 2016-05-11: 1 mg via INTRAVENOUS

## 2016-05-11 MED ORDER — FENTANYL CITRATE (PF) 250 MCG/5ML IJ SOLN
INTRAMUSCULAR | Status: AC
Start: 2016-05-11 — End: 2016-05-11
  Filled 2016-05-11: qty 5

## 2016-05-11 MED ORDER — FENTANYL CITRATE (PF) 100 MCG/2ML IJ SOLN
25.0000 ug | INTRAMUSCULAR | Status: DC | PRN
  Administered 2016-05-11 (×3): 50 ug via INTRAVENOUS

## 2016-05-11 MED ORDER — LIDOCAINE HCL (PF) 1 % IJ SOLN
INTRAMUSCULAR | Status: DC | PRN
  Administered 2016-05-11: 30 mL via INTRADERMAL

## 2016-05-11 MED ORDER — CEFAZOLIN SODIUM 1 G IJ SOLR
INTRAMUSCULAR | Status: AC
  Filled 2016-05-11: qty 20

## 2016-05-11 MED ORDER — CEFAZOLIN SODIUM 1 G IJ SOLR
INTRAMUSCULAR | Status: DC | PRN
  Administered 2016-05-11: 2 g via INTRAMUSCULAR

## 2016-05-11 MED ORDER — FENTANYL CITRATE (PF) 100 MCG/2ML IJ SOLN
INTRAMUSCULAR | Status: DC | PRN
  Administered 2016-05-11 (×3): 25 ug via INTRAVENOUS

## 2016-05-11 MED ORDER — PROPOFOL 500 MG/50ML IV EMUL
INTRAVENOUS | Status: DC | PRN
  Administered 2016-05-11: 25 ug/kg/min via INTRAVENOUS

## 2016-05-11 MED ORDER — PROPOFOL 1000 MG/100ML IV EMUL
INTRAVENOUS | Status: AC
  Filled 2016-05-11: qty 100

## 2016-05-11 MED ORDER — SODIUM CHLORIDE 0.9 % IV SOLN
500.0000 mg | Freq: Once | INTRAVENOUS | Status: AC
  Administered 2016-05-11: 500 mg via INTRAPLEURAL
  Filled 2016-05-11: qty 500

## 2016-05-11 MED ORDER — MIDAZOLAM HCL 2 MG/2ML IJ SOLN
INTRAMUSCULAR | Status: AC
  Filled 2016-05-11: qty 2

## 2016-05-11 MED ORDER — FENTANYL CITRATE (PF) 100 MCG/2ML IJ SOLN
INTRAMUSCULAR | Status: AC
  Administered 2016-05-11: 50 ug via INTRAVENOUS
  Filled 2016-05-11: qty 2

## 2016-05-11 MED ORDER — TRAMADOL HCL 50 MG PO TABS
50.0000 mg | ORAL_TABLET | Freq: Four times a day (QID) | ORAL | Status: DC | PRN
  Administered 2016-05-11 – 2016-05-12 (×2): 50 mg via ORAL
  Filled 2016-05-11: qty 1

## 2016-05-11 MED ORDER — 0.9 % SODIUM CHLORIDE (POUR BTL) OPTIME
TOPICAL | Status: DC | PRN
  Administered 2016-05-11: 1000 mL

## 2016-05-11 MED ORDER — OXYCODONE HCL 5 MG PO TABS
10.0000 mg | ORAL_TABLET | ORAL | Status: DC | PRN
  Administered 2016-05-11 – 2016-05-12 (×3): 10 mg via ORAL
  Filled 2016-05-11 (×3): qty 2

## 2016-05-11 MED ORDER — LACTATED RINGERS IV SOLN
INTRAVENOUS | Status: DC | PRN
Start: 2016-05-11 — End: 2016-05-11
  Administered 2016-05-11: 08:00:00 via INTRAVENOUS

## 2016-05-11 MED ORDER — TRAMADOL HCL 50 MG PO TABS
ORAL_TABLET | ORAL | Status: AC
  Administered 2016-05-11: 50 mg via ORAL
  Filled 2016-05-11: qty 1

## 2016-05-11 MED ORDER — DOXYCYCLINE HYCLATE 100 MG IV SOLR
500.0000 mg | Freq: Once | INTRAVENOUS | Status: AC
  Administered 2016-05-11: 500 mg via INTRAPLEURAL
  Filled 2016-05-11 (×2): qty 500

## 2016-05-11 MED ORDER — DEXTROSE 5 % IV SOLN
1.5000 g | INTRAVENOUS | Status: DC
  Filled 2016-05-11: qty 1.5

## 2016-05-11 MED ORDER — LIDOCAINE 2% (20 MG/ML) 5 ML SYRINGE
INTRAMUSCULAR | Status: AC
Start: 2016-05-11 — End: 2016-05-11
  Filled 2016-05-11: qty 5

## 2016-05-11 SURGICAL SUPPLY — 32 items
ADH SKN CLS APL DERMABOND .7 (GAUZE/BANDAGES/DRESSINGS) ×2
BRUSH SCRUB EZ PLAIN DRY (MISCELLANEOUS) ×4 IMPLANT
CANISTER SUCTION 2500CC (MISCELLANEOUS) ×4 IMPLANT
COVER SURGICAL LIGHT HANDLE (MISCELLANEOUS) ×4 IMPLANT
DERMABOND ADVANCED (GAUZE/BANDAGES/DRESSINGS) ×2
DERMABOND ADVANCED .7 DNX12 (GAUZE/BANDAGES/DRESSINGS) ×2 IMPLANT
DRAPE C-ARM 42X72 X-RAY (DRAPES) ×4 IMPLANT
DRAPE LAPAROSCOPIC ABDOMINAL (DRAPES) ×4 IMPLANT
DRSG TEGADERM 4X4.75 (GAUZE/BANDAGES/DRESSINGS) ×2 IMPLANT
GLOVE BIOGEL PI IND STRL 6 (GLOVE) IMPLANT
GLOVE BIOGEL PI INDICATOR 6 (GLOVE) ×2
GLOVE EUDERMIC 7 POWDERFREE (GLOVE) ×4 IMPLANT
GOWN STRL REUS W/ TWL LRG LVL3 (GOWN DISPOSABLE) ×2 IMPLANT
GOWN STRL REUS W/ TWL XL LVL3 (GOWN DISPOSABLE) ×2 IMPLANT
GOWN STRL REUS W/TWL LRG LVL3 (GOWN DISPOSABLE) ×4
GOWN STRL REUS W/TWL XL LVL3 (GOWN DISPOSABLE) ×4
KIT BASIN OR (CUSTOM PROCEDURE TRAY) ×4 IMPLANT
KIT PLEURX DRAIN CATH 1000ML (MISCELLANEOUS) ×6 IMPLANT
KIT PLEURX DRAIN CATH 15.5FR (DRAIN) ×7 IMPLANT
KIT ROOM TURNOVER OR (KITS) ×4 IMPLANT
NS IRRIG 1000ML POUR BTL (IV SOLUTION) ×4 IMPLANT
PACK GENERAL/GYN (CUSTOM PROCEDURE TRAY) ×4 IMPLANT
PAD ARMBOARD 7.5X6 YLW CONV (MISCELLANEOUS) ×8 IMPLANT
SET DRAINAGE LINE (MISCELLANEOUS) IMPLANT
SPONGE GAUZE 4X4 12PLY STER LF (GAUZE/BANDAGES/DRESSINGS) ×2 IMPLANT
STOPCOCK 4 WAY LG BORE MALE ST (IV SETS) ×3 IMPLANT
SUT ETHILON 3 0 PS 1 (SUTURE) ×7 IMPLANT
SUT VIC AB 3-0 X1 27 (SUTURE) ×6 IMPLANT
TOWEL OR 17X24 6PK STRL BLUE (TOWEL DISPOSABLE) ×4 IMPLANT
TOWEL OR 17X26 10 PK STRL BLUE (TOWEL DISPOSABLE) ×4 IMPLANT
VALVE REPLACEMENT CAP (MISCELLANEOUS) IMPLANT
WATER STERILE IRR 1000ML POUR (IV SOLUTION) ×4 IMPLANT

## 2016-05-11 NOTE — Anesthesia Postprocedure Evaluation (Signed)
Anesthesia Post Note  Patient: Craig [name]  Procedure(s) Performed: Procedure(s) (LRB): INSERTION PLEURAL DRAINAGE CATHETER  (Bilateral) PLEURADESIS (Bilateral)  Patient location during evaluation: PACU Anesthesia Type: MAC Level of consciousness: awake and alert Pain management: pain level controlled Vital Signs Assessment: post-procedure vital signs reviewed and stable Respiratory status: spontaneous breathing, nonlabored ventilation, respiratory function stable and patient connected to nasal cannula oxygen Cardiovascular status: stable and blood pressure returned to baseline Anesthetic complications: no    Last Vitals:  Filed Vitals:   05/11/16 1126 05/11/16 1200  BP: 157/102 102/88  Pulse: 117 109  Temp:    Resp:      Last Pain:  Filed Vitals:   05/11/16 1203  PainSc: 6                  Makya Phillis,W. EDMOND

## 2016-05-11 NOTE — Brief Op Note (Signed)
05/06/2016 - 05/11/2016  9:27 AM  PATIENT:  Craig [name]  65 y.o. male  PRE-OPERATIVE DIAGNOSIS:  bilateral pleural effusions  POST-OPERATIVE DIAGNOSIS:  bilateral pleural effusions  PROCEDURE:  Procedure(s): INSERTION PLEURAL DRAINAGE CATHETER  (Bilateral) PLEURADESIS (Bilateral)  SURGEON:  Surgeon(s) and Role:    * Alleen BorneBryan K Raheem Kolbe, MD - Primary  PHYSICIAN ASSISTANT: none  ASSISTANTS: none   ANESTHESIA:   local and MAC  EBL:  Total I/O In: 250 [I.V.:250] Out: 10 [Blood:10]  BLOOD ADMINISTERED:none  DRAINS: bilateral pleurX catheters   LOCAL MEDICATIONS USED:  LIDOCAINE   SPECIMEN:  No Specimen  DISPOSITION OF SPECIMEN:  N/A  COUNTS:  YES  TOURNIQUET:  * No tourniquets in log *  DICTATION: .Note written in EPIC  PLAN OF CARE: Admit to inpatient   PATIENT DISPOSITION:  PACU - hemodynamically stable.   Delay start of Pharmacological VTE agent (>24hrs) due to surgical blood loss or risk of bleeding: not applicable

## 2016-05-11 NOTE — Op Note (Signed)
CARDIOTHORACIC SURGERY OPERATIVE NOTE   05/11/2016 Craig [name] [id number]  Surgeon: Alleen BorneBryan K. Bartle, MD   First Assistant: none   Preoperative Diagnosis:  Recurrent bilateral pleural effusions  Postoperative Diagnosis: same   Procedure:   1. Insertion of Bilateral PleurX catheters 2. 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 He had a slow complicated postop course with respiratory failure requiring reintubation twice. He had chronic problems since surgery with bilateral pleural effusions that have been symptomatic. He had such marginal lung function that even small to moderate effusions were causing respiratory decline postop. He had repeated thoracentesis and eventually insertion of bilateral pleurX catheters on 01/25/2016. These had been drained at a SNF initially and then at home by the patient and his sister. The left one was removed on 5/11 and the right one on 6/9. He looked great at that point and breathing was at his baseline. He now presents with recurrent shortness of breath that he says started a few days after the second catheter was removed. He says that he gets short of breath with any activity at all. A CXR on admission showed small bilateral pleural effusions and did not look any different than his last CXR. A CTA ruled out PE ( he has been on Eliquis for atrial fib). It did show moderate bilateral pleural effusions with mild bibasilar atelectasis. The radiologist called the effusions large but I think they are moderate at best. His sat on room air was 77% on admission. His BNP was 500's and troponin negative. He was diuresed 4L without improvement in symptoms. Echo showed EF 55-60% with normal  wall motion. Valves functioning fine.   I think his shortness of breath is multi-factorial as it always has been. He has severe COPD with an FEV1 of 1.2 (35% predicted) and a DLCO of 28% predicted. He also has chronic debilitation from his surgery that he has not fully recovered from yet, atrial fibrillation and probably some diastolic heart failure. His effusions are in the small to moderate range but are probably large enough to put him over the edge. He was much better immediately after the PleurX catheters were inserted before and that allowed him to ambulate, eat and progress. Unfortunately they have recurred after the catheters were removed. I think the effusions are also multifactorial due to the postop inflammatory state, diastolic heart failure, previous severe malnutrition with low albumin. I think the best option is probably to try to reinsert the PleurX catheters and perform pleurodesis with doxycycline since Talc is not available. It may not be possible to insert the catheters again but it is worth a try. There is really nothing else to offer him and this did help him before. I discussed the procedure with him including alternatives, benefits and risks including but not limited to bleeding, infection, lung injury, inability to place the catheters, catheter malfunction and recurrent effusions and he says that he understands and agrees to proceed.   Preparation:  The patient was seen in the preoperative holding area and the correct patient, correct operation, correct operative sidewere confirmed with the patient after reviewing the medical record and CT scan. The right and left sides of the chest was signed by me. The consent was signed by  me. Preoperative antibiotics were given. The patient was taken back to the operating room and positioned supine on the operating room table. After intravenous sedation by anesthesia the chest was prepped with betadine soap and solution and draped in the usual  sterile manner. A surgical time-out was taken and the correct patient,operative side, and operative procedure were confirmed with the nursing and anesthesia staff.   Operative Procedure:   The chest wall entry site was located on the right lateral chest approximately in the 8th ICS. 1% lidocaine was infiltrated in the skin and subcutaneous tissue down to the pleural space. Serous fluid was encountered. A small incision was made and the right pleural space was entered with a needle catheter. The needle was removed and the guidewire inserted into the pleural space. Its position was checked with floroscopy. The skin exit site was chosen on the anterior chest wall just above the costal margin and lidocaine was infiltrated here and along a subcutaneous tunnel over to the chest wall entry site. A small incision was made at the skin exit site and the Pleurx catheter was tunneled from the exit site over to the chest wall entry site and positioned with the cuff just inside the exit site. An introducer and sheath were inserted into the pleural space over the guide wire and the introducer and wire removed. The catheter was inserted into the pleural space and the sheath removed. The catheter was connected to vacuum bottle suction and 900 cc of yellow serous pleural fluid was removed. A sample was not sent to the lab. The catheter was fixed to the skin at the exit site with a nylon suture and the other incision was closed with a 3-0 vicryl subcuticular suture and Dermabond. The catheter was capped and a dressing applied over the exit site.   The the chest wall entry site was located on the left lateral chest approximately in the 8th ICS. 1% lidocaine was infiltrated in the skin and subcutaneous tissue down to the pleural space. Serous fluid was encountered. A small incision was made and the left pleural space was entered with a needle catheter. The needle was removed and the guidewire inserted into the pleural space. Its  position was checked with floroscopy. The skin exit site was chosen on the anterior chest wall just above the costal margin and lidocaine was infiltrated here and along a subcutaneous tunnel over to the chest wall entry site. A small incision was made at the skin exit site and the Pleurx catheter was tunneled from the exit site over to the chest wall entry site and positioned with the cuff just inside the exit site. An introducer and sheath were inserted into the pleural space over the guide wire and the introducer and wire removed. The catheter was inserted into the pleural space and the sheath removed. The catheter was connected to vacuum bottle suction and 600 cc of yellow serous pleural fluid was removed. A sample was not sent to the lab. The catheter was fixed to the skin at the exit site with a nylon suture and the other incision was closed with a 3-0 vicryl subcuticular suture and Dermabond. .   Then pleurodesis on both sides was performed using 500 mg of doxycycline in 60 cc of saline which was infused through the pleurX catheter on both sides. The catheters were capped and dry sterile dressings applied around the exit sites. The patient tolerated the procedure well with mild coughing. The sponge, needle and instrument counts were  correct according to the nurses and the patient was transported to the PACU in stable and satisfactory condition.

## 2016-05-11 NOTE — Anesthesia Procedure Notes (Signed)
Procedure Name: MAC Date/Time: 05/11/2016 8:38 AM Performed by: Roney MansSMITH, Joyelle Siedlecki P Pre-anesthesia Checklist: Patient identified, Emergency Drugs available, Suction available, Patient being monitored and Timeout performed Oxygen Delivery Method: Simple face mask Preoxygenation: Pre-oxygenation with 100% oxygen Intubation Type: IV induction Dental Injury: Teeth and Oropharynx as per pre-operative assessment

## 2016-05-11 NOTE — Transfer of Care (Signed)
Immediate Anesthesia Transfer of Care Note  Patient: Craig [name]  Procedure(s) Performed: Procedure(s): INSERTION PLEURAL DRAINAGE CATHETER  (Bilateral) PLEURADESIS (Bilateral)  Patient Location: PACU  Anesthesia Type:MAC  Level of Consciousness: awake, alert , oriented and patient cooperative  Airway & Oxygen Therapy: Patient Spontanous Breathing and Patient connected to nasal cannula oxygen  Post-op Assessment: Report given to RN and Post -op Vital signs reviewed and stable  Post vital signs: Reviewed and stable  Last Vitals:  Filed Vitals:   05/11/16 0454 05/11/16 0937  BP: 111/69 144/92  Pulse: 81 77  Temp: 36.6 C 36.6 C  Resp: 17 18    Last Pain:  Filed Vitals:   05/11/16 0943  PainSc: 0-No pain         Complications: No apparent anesthesia complications

## 2016-05-11 NOTE — Progress Notes (Signed)
Pt still c/o pain, BP still elevated. MDs nurse paged. Nurse stated she would call hospital RN back. Will continue to monitor the pt.

## 2016-05-11 NOTE — Progress Notes (Signed)
Pt returned to 2w33 post-op. Pt hooked back up to tele monitor. Pt c/o 6/10 pain. Pain med previously given. Elevated BP also noted. MD paged. Will continue to monitor.

## 2016-05-11 NOTE — Progress Notes (Signed)
     SUBJECTIVE: No dyspnea at rest. He is dyspneic when moving around. No chest pain.   Tele: atrial fib  BP 111/69 mmHg  Pulse 81  Temp(Src) 97.8 F (36.6 C) (Oral)  Resp 17  Ht 5\' 9"  (1.753 m)  Wt 116 lb 10 oz (52.9 kg)  BMI 17.21 kg/m2  SpO2 95%  Intake/Output Summary (Last 24 hours) at 05/11/16 0709 Last data filed at 05/11/16 0356  Gross per 24 hour  Intake    953 ml  Output    850 ml  Net    103 ml    PHYSICAL EXAM General: Thin male. No acute distress. Alert and oriented x 3.  Psych:  Good affect, responds appropriately Neck: No JVD. No masses noted.  Lungs: Clear bilaterally with no wheezes or rhonci noted.  Heart: Irreg irreg with no murmurs noted. Abdomen: Bowel sounds are present. Soft, non-tender.  Extremities: No lower extremity edema.   LABS: Basic Metabolic Panel:  Recent Labs  86/57/8406/01/21 0250 05/11/16 0317  NA 137 139  K 4.5 4.8  CL 99* 100*  CO2 34* 32  GLUCOSE 119* 124*  BUN 19 25*  CREATININE 1.19 1.35*  CALCIUM 9.1 9.0   CBC:  Recent Labs  05/10/16 0601 05/11/16 0317  WBC 7.3 7.4  HGB 11.3* 11.1*  HCT 35.9* 35.8*  MCV 95.2 97.3  PLT 173 180    Current Meds: . aspirin EC  81 mg Oral Daily  . atorvastatin  80 mg Oral Daily  . furosemide  40 mg Oral Daily  . metoprolol tartrate  25 mg Oral BID  . mometasone-formoterol  2 puff Inhalation BID  . sodium chloride flush  3 mL Intravenous Q12H  . tiotropium  18 mcg Inhalation Daily     ASSESSMENT AND PLAN:  1. Dyspnea/hypoxia: Likely multifactorial with underlying but it is felt that the acute change is due to his recurrent pleural effusions. He has diuresed 5 liters since admission with no improvement in his dyspnea and does not appear to be volume overloaded at this time. Dr. Laneta SimmersBartle is planning PleurX catheter placement today with pleuorodesis. Eliquis on hold for this procedure.    2. Persistent atrial fibrillation: He is rate controlled, continue metoprolol. Eliquis on hold  for procedure. He is on heparin drip. Cannot cardiovert right now as anti-coagulation may have to be interrupted over next month for other procedures.   3. Acute on chronic systolic/diastolic congestive heart failure: Volume status is ok. Continue po Lasix.   4. HTN: Blood pressure controlled. Continue on current medication for now.   5. HLD: He is on a statin  6. Severe AS: s/p aortic valve replacement using 25 mm Edwards Magna-ease pericardial valve march 2017  7. Thoracic aortic aneurysm: Dilated ascending aorta s/p supra-coronary ascending aortic and hemi-arch replacement using a 30 mm Hemashield per Dr. Laneta SimmersBartle in March 2017.    Verne CarrowChristopher McAlhany  7/5/20177:09 AM

## 2016-05-11 NOTE — Progress Notes (Signed)
Cardiothoracic Surgery  He is stable for surgery this am. Continues to have shortness of breath with minimal exertion in room. Plan insertion of bilateral PleurX catheters and pleurodesis with Doxycycline this am.

## 2016-05-12 ENCOUNTER — Encounter (HOSPITAL_COMMUNITY): Payer: Self-pay | Admitting: Surgery

## 2016-05-12 ENCOUNTER — Telehealth: Payer: Self-pay | Admitting: Cardiovascular Disease

## 2016-05-12 ENCOUNTER — Inpatient Hospital Stay (HOSPITAL_COMMUNITY): Payer: Medicaid Other

## 2016-05-12 DIAGNOSIS — I2510 Atherosclerotic heart disease of native coronary artery without angina pectoris: Principal | ICD-10-CM

## 2016-05-12 DIAGNOSIS — J90 Pleural effusion, not elsewhere classified: Secondary | ICD-10-CM

## 2016-05-12 LAB — CBC
HEMATOCRIT: 40.7 % (ref 39.0–52.0)
HEMOGLOBIN: 12.5 g/dL — AB (ref 13.0–17.0)
MCH: 30.6 pg (ref 26.0–34.0)
MCHC: 30.7 g/dL (ref 30.0–36.0)
MCV: 99.8 fL (ref 78.0–100.0)
Platelets: 173 10*3/uL (ref 150–400)
RBC: 4.08 MIL/uL — ABNORMAL LOW (ref 4.22–5.81)
RDW: 17.3 % — AB (ref 11.5–15.5)
WBC: 9.2 10*3/uL (ref 4.0–10.5)

## 2016-05-12 MED ORDER — METOPROLOL TARTRATE 25 MG PO TABS: 12.5000 mg | ORAL_TABLET | Freq: Two times a day (BID) | ORAL | Status: DC

## 2016-05-12 MED ORDER — NITROGLYCERIN 0.4 MG SL SUBL: 0.4000 mg | SUBLINGUAL_TABLET | SUBLINGUAL | Status: DC | PRN

## 2016-05-12 MED ORDER — TRAMADOL HCL 50 MG PO TABS: 50.0000 mg | ORAL_TABLET | Freq: Four times a day (QID) | ORAL | Status: DC | PRN

## 2016-05-12 MED ORDER — APIXABAN 5 MG PO TABS
5.0000 mg | ORAL_TABLET | Freq: Two times a day (BID) | ORAL | Status: DC
  Administered 2016-05-12: 5 mg via ORAL
  Filled 2016-05-12: qty 1

## 2016-05-12 MED ORDER — METOPROLOL TARTRATE 25 MG PO TABS: 25.0000 mg | ORAL_TABLET | Freq: Two times a day (BID) | ORAL | Status: DC

## 2016-05-12 NOTE — Discharge Summary (Signed)
Discharge Summary    Patient ID: Craig [name],  MRN: [id number], DOB/AGE: [birth year] 65 y.o.  Admit date: 05/06/2016 Discharge date: 05/12/2016  Primary Care Provider: Veda Canning D Primary Cardiologist: Dr. Clifton James  Discharge Diagnoses    Active Problems:   Dyspnea   Allergies No Known Allergies  Diagnostic Studies/Procedures  Transthoracic Echocardiography Left ventricle: The cavity size was normal. Wall thickness was  increased in a pattern of mild LVH. Systolic function was normal.  The estimated ejection fraction was in the range of 55% to 60%.  Wall motion was normal; there were no regional wall motion  abnormalities. - Ventricular septum: The contour showed diastolic flattening and  systolic flattening. - Aortic valve: A bioprosthesis was present. There was trivial  regurgitation. - Mitral valve: There was mild regurgitation. - Left atrium: The atrium was mildly dilated. - Pericardium, extracardiac: A small pericardial effusion was  identified. There was a left pleural effusion.  Impressions:  - Normal LV systolic function; mild LVH; septal flattening  suggestive of pulmonary hypertension; miild LAE; s/p AVR with  normal gradient and trace AI; mild MR.  _____________   History of Present Illness   Craig [name] is a 65 year old male with a past medical history of CAD s/p CABG, severe aortic stenosis s/p AVR, thoracic aortic aneurysm s/p root replacement, COPD, HTN, HLD, and tobacco abuse. He had AVR with pericardial valve and aortic root replacement on 01/07/16 and developed post operative atrial fibrillation (on Eliquis) and pleural effusions. He was sent home with PleurX catheters for recurrent effusions that were eventually discontinued on 04/15/16 after his effusions had resolved.   He presented to Dr. Gibson Ramp office on 05/06/16 and was hypoxic with O2 sats of 77% on room air. He was admitted for further observation as the etiology of  his dyspnea was felt to be multi-factorial as he has COPD, chronic combined systolic and diastolic CHF, and recent history of recurrent pleural effusions.    Hospital Course     Consultants: Cardiothoracic surgery  Craig [name]'s D-dimer was elevated at 6.16, his troponin was 0.05. Chest X ray showed small bilateral pleural effusions. Chest CT showed no evidence of PE. There was an initial plan of doing TEE/DCCV since he had been on Eliquis for 9 days already however, it was felt that he needed reinsertion of the PleurX catheters so his anti coagulation would have to be placed on hold, thus making it not possible to due TEE/DCCV.   He was diuresed over 5 L during admission without any improvement in his dyspnea, thus further assuring [place] that his dyspnea is likely related to his COPD and pleural effusions.   Bilateral PleurX catheter insertion and pleurodesis with Doxycycline completed on 05/11/16 without complication.   His Eliquis was restarted on 05/12/16. His family is very familiar with how to take care of PleurX drain site as well as how to use the drain. They have done well in the past. He will need to follow up with CVTS next week.   His atrial fibrillation is rate controlled with metoprolol, we will revisit the idea of cardioversion at a later time. His metoprolol was decreased upon discharge to 12.5mg  BID as he was hypotensive at times during admission with SBP's in the 90's. Can increase outpatient if needed.   He will be discharged home with oxygen.   _____________  Discharge Vitals Blood pressure 93/60, pulse 97, temperature 98.6 F (37 C), temperature source Oral, resp. rate 16, height  5\' 9"  (1.753 m), weight 117 lb (53.071 kg), SpO2 82 %.  Filed Weights   05/10/16 0401 05/11/16 0454 05/12/16 0500  Weight: 114 lb 4.8 oz (51.846 kg) 116 lb 10 oz (52.9 kg) 117 lb (53.071 kg)    Labs & Radiologic Studies     CBC  Recent Labs  05/11/16 0317 05/12/16 0330  WBC 7.4 9.2  HGB  11.1* 12.5*  HCT 35.8* 40.7  MCV 97.3 99.8  PLT 180 173   Basic Metabolic Panel  Recent Labs  05/11/16 0317  NA 139  K 4.8  CL 100*  CO2 32  GLUCOSE 124*  BUN 25*  CREATININE 1.35*  CALCIUM 9.0     Dg Chest 2 View  05/12/2016  CLINICAL DATA:  Bilateral pleural effusions. EXAM: CHEST  2 VIEW COMPARISON:  05/11/2016. FINDINGS: Bilateral chest tubes in stable position. Small left apical pneumothorax noted on today's exam. Bilateral pleural effusions. Low lung volumes with bibasilar atelectasis and/or infiltrates, improved from prior exam . Prior CABG. Heart size normal. Old bilateral rib fractures again noted. IMPRESSION: 1. Bilateral chest tubes in stable position. Small left apical pneumothorax noted on today's exam. Bilateral pleural effusions again noted. 2. Low lung volumes with bibasilar atelectasis and/or infiltrates, improved from prior exam . 3. Prior CABG.  Heart size normal. Critical Value/emergent results were called by telephone at the time of interpretation on 05/12/2016 at 7:20 am to nurse Fannie Knee, who verbally acknowledged these results. Electronically Signed   By: Maisie Fus  Register   On: 05/12/2016 07:22   X-ray Chest Pa And Lateral  05/06/2016  CLINICAL DATA:  Shortness of breath, hypertension, EXAM: CHEST  2 VIEW COMPARISON:  04/22/2016 FINDINGS: Prior CABG and valve replacement. Heart is normal size. Small bilateral pleural effusions. Bibasilar atelectasis. No acute bony abnormality. IMPRESSION: Small bilateral pleural effusions with bibasilar atelectasis. Electronically Signed   By: Charlett Nose M.D.   On: 05/06/2016 12:21   Dg Chest 2 View  04/22/2016  CLINICAL DATA:  Shortness of breath, RIGHT chest discomfort, pleural effusions, recent Pleur-X catheter removal, history of COPD, CABG on 01/07/2016, hypertension, former smoker EXAM: CHEST  2 VIEW COMPARISON:  04/13/2016 FINDINGS: Normal heart size post CABG. Atherosclerotic calcification aorta. Mediastinal contours and  pulmonary vascularity normal. Bibasilar pleural effusions atelectasis similar to previous exam. Underlying emphysematous changes. Remaining lungs clear. No pneumothorax. Multiple old BILATERAL rib fractures. IMPRESSION: COPD changes with persistent bibasilar effusions and atelectasis. Electronically Signed   By: Ulyses Southward M.D.   On: 04/22/2016 11:24   Dg Chest 2 View  04/13/2016  CLINICAL DATA:  History of CABG in March of 2017 with recurrent pleural effusion, followup EXAM: CHEST  2 VIEW COMPARISON:  Chest x-ray of 04/06/2016 FINDINGS: There appears to be a PleurX catheter at the right lung base. No definite right pleural effusion is seen. There is persistent blunting of the left costophrenic angle which may represent a small left effusion and possibly mild left pleural thickening. The lungs remain somewhat hyperaerated with chronic interstitial changes noted. Mediastinal and hilar contours are stable and mild cardiomegaly is stable. Median sternotomy sutures are noted from prior CABG and aortic valve replacement. Old bilateral rib fractures are noted. No acute bony abnormality is seen. IMPRESSION: 1. Apparent PleurX catheter remains at the right lung base. No definite right pleural effusion is seen. 2. Stable small left pleural effusion versus pleural thickening. Electronically Signed   By: Dwyane Dee M.D.   On: 04/13/2016 09:42   Dg Chest 4  View  05/09/2016  CLINICAL DATA:  Bilateral pleural effusion EXAM: CHEST - 4+ VIEW COMPARISON:  05/06/2016 chest radiograph. FINDINGS: Sternotomy wires appear aligned and intact. Cardiac valvular prosthesis is in place. Surgical clips overlie the left axilla. Stable cardiomediastinal silhouette with normal heart size. No pneumothorax. Small bilateral pleural effusions appear stable. No overt pulmonary edema. Patchy bibasilar consolidation appears stable. IMPRESSION: 1. Stable small bilateral pleural effusions. 2. Stable patchy bibasilar lung consolidation, favor  atelectasis. Electronically Signed   By: Delbert PhenixJason A Poff M.D.   On: 05/09/2016 10:23   Ct Angio Chest Pe W Or Wo Contrast  05/06/2016  CLINICAL DATA:  Shortness of breath for 3 days EXAM: CT ANGIOGRAPHY CHEST WITH CONTRAST TECHNIQUE: Multidetector CT imaging of the chest was performed using the standard protocol during bolus administration of intravenous contrast. Multiplanar CT image reconstructions and MIPs were obtained to evaluate the vascular anatomy. CONTRAST:  80 mL Isovue 370 COMPARISON:  Film from earlier in the same day, CT from 01/11/2016 FINDINGS: Mediastinum/Lymph Nodes: No significant hilar or mediastinal adenopathy is identified. Cardiovascular: The thoracic aorta demonstrates atherosclerotic calcifications although no definitive aneurysmal dilatation is seen. Changes of prior coronary bypass grafting are noted. Pulmonary artery demonstrates a normal branching pattern. No filling defects to suggest pulmonary emboli are identified. Coronary calcifications are noted. No right heart strain is noted. Postsurgical changes in the aorta are again seen. Previously seen periaortic hemorrhage is no longer identified. Lungs/Pleura: Emphysematous changes are again seen. Large bilateral pleural effusions are noted. Some basilar atelectasis is noted likely related to the effusions. No pneumothorax is noted. Upper abdomen: 3 mm nonobstructing right renal stone is noted. No other focal abnormality is noted in the upper abdomen. Musculoskeletal: Degenerative change of thoracic spine is noted. Review of the MIP images confirms the above findings. IMPRESSION: No evidence of pulmonary emboli. Large bilateral pleural effusions which have increased in the interval from the prior exam. Mild lower lobe atelectasis is noted bilaterally improved from the prior study. The previously seen periaortic hemorrhage has resolved in the interval. Electronically Signed   By: Alcide CleverMark  Lukens M.D.   On: 05/06/2016 16:07   Dg Chest Port 1  View  05/11/2016  CLINICAL DATA:  PleurX catheter placement EXAM: DG C-ARM 1-60 MIN-NO REPORT; PORTABLE CHEST - 1 VIEW COMPARISON:  May 09, 2016 FINDINGS: Chest catheters are present bilaterally with the tips in the medial apical regions on each side. No pneumothorax. Pleural effusions are smaller bilaterally compared to 2 days prior. There is interstitial and patchy alveolar edema in the bases bilaterally. There is cardiomegaly. The pulmonary vascularity is within normal limits. Patient is status post aortic valve replacement and coronary artery bypass grafting. No adenopathy evident. There is atherosclerotic calcification in the aorta. There old healed rib fractures bilaterally. IMPRESSION: Chest catheters as described without pneumothorax. Small residual pleural effusions remain with interstitial and alveolar edema in the bases. There is evidence congestive heart failure. Superimposed pneumonia in the bases cannot be excluded radiographically. There is aortic atherosclerosis. Old rib trauma, healed, noted bilaterally. Electronically Signed   By: Bretta BangWilliam  Woodruff III M.D.   On: 05/11/2016 10:30   Dg C-arm 1-60 Min-no Report  05/11/2016  CLINICAL DATA: intra op C-ARM 1-60 MINUTES Fluoroscopy was utilized by the requesting physician.  No radiographic interpretation.    Disposition   Pt is being discharged home today in good condition.  Follow-up Plans & Appointments    Follow-up Information    Follow up with Alleen BorneBryan K Bartle, MD.  Specialty:  Cardiothoracic Surgery   Why:  Call office to schedule an appointment next week.    Contact information:   [address] [place] [address] [phone]       Follow up with Norma FredricksonLORI GERHARDT, NP On 05/23/2016.   Specialties:  Nurse Practitioner, Interventional Cardiology, Cardiology, Radiology   Why:  at 9 am for follow up.    Contact information:   [address] [place] [address] [phone]      Discharge  Instructions    Call MD for:  difficulty breathing, headache or visual disturbances    Complete by:  As directed      Call MD for:  persistant nausea and vomiting    Complete by:  As directed      Call MD for:  redness, tenderness, or signs of infection (pain, swelling, redness, odor or green/yellow discharge around incision site)    Complete by:  As directed      Call MD for:  severe uncontrolled pain    Complete by:  As directed      Call MD for:  temperature >100.4    Complete by:  As directed      Diet - low sodium heart healthy    Complete by:  As directed      For home use only DME oxygen    Complete by:  As directed   Mode or (Route):  Nasal cannula  Liters per Minute:  2  Frequency:  Continuous (stationary and portable oxygen unit needed)     Increase activity slowly    Complete by:  As directed            Discharge Medications   Current Discharge Medication List    START taking these medications   Details  nitroGLYCERIN (NITROSTAT) 0.4 MG SL tablet Place 1 tablet (0.4 mg total) under the tongue every 5 (five) minutes x 3 doses as needed for chest pain. Qty: 25 tablet, Refills: 1      CONTINUE these medications which have CHANGED   Details  metoprolol tartrate (LOPRESSOR) 25 MG tablet Take 0.5 tablets (12.5 mg total) by mouth 2 (two) times daily. Qty: 30 tablet, Refills: 12   Associated Diagnoses: Coronary artery disease involving coronary bypass graft of native heart without angina pectoris; H/O aortic valve replacement; Paroxysmal atrial fibrillation (HCC)      CONTINUE these medications which have NOT CHANGED   Details  apixaban (ELIQUIS) 5 MG TABS tablet Take 1 tablet (5 mg total) by mouth 2 (two) times daily. Qty: 60 tablet, Refills: 5    aspirin 81 MG EC tablet Take 1 tablet (81 mg total) by mouth daily. Qty: 30 tablet, Refills: 1    atorvastatin (LIPITOR) 80 MG tablet Take 1 tablet (80 mg total) by mouth daily. Qty: 90 tablet, Refills: 3      budesonide-formoterol (SYMBICORT) 160-4.5 MCG/ACT inhaler Inhale 2 puffs into the lungs 2 (two) times daily as needed (For shortness of breath.).     furosemide (LASIX) 40 MG tablet Take 1 tablet (40 mg total) by mouth daily. Qty: 90 tablet, Refills: 3    sodium chloride (OCEAN) 0.65 % SOLN nasal spray Place 1 spray into both nostrils as needed for congestion.    tiotropium (SPIRIVA) 18 MCG inhalation capsule Place 18 mcg into inhaler and inhale daily as needed (For shortness of breath.).            Outstanding Labs/Studies  Duration of Discharge Encounter   Greater than 30 minutes including physician time.  Signed, Little Ishikawa NP 05/12/2016, 12:30 PM

## 2016-05-12 NOTE — Progress Notes (Signed)
     SUBJECTIVE: Improvement in dyspnea. NO chest pain.   Tele:atrial fib  BP 99/67 mmHg  Pulse 81  Temp(Src) 98.6 F (37 C) (Oral)  Resp 16  Ht 5\' 9"  (1.753 m)  Wt 117 lb (53.071 kg)  BMI 17.27 kg/m2  SpO2 95%  Intake/Output Summary (Last 24 hours) at 05/12/16 0859 Last data filed at 05/12/16 0717  Gross per 24 hour  Intake    730 ml  Output    660 ml  Net     70 ml    PHYSICAL EXAM General: Thin male. NAD. Alert and oriented x 3.  Psych:  Good affect, responds appropriately Neck: No JVD. No masses noted.  Lungs: Clear bilaterally with no wheezes or rhonci noted.  Heart: Irreg irreg with no murmurs noted. Abdomen: Bowel sounds are present. Soft, non-tender.  Extremities: No lower extremity edema.   LABS: Basic Metabolic Panel:  Recent Labs  96/02/5406/05/17 0317  NA 139  K 4.8  CL 100*  CO2 32  GLUCOSE 124*  BUN 25*  CREATININE 1.35*  CALCIUM 9.0   CBC:  Recent Labs  05/11/16 0317 05/12/16 0330  WBC 7.4 9.2  HGB 11.1* 12.5*  HCT 35.8* 40.7  MCV 97.3 99.8  PLT 180 173   Current Meds: . aspirin EC  81 mg Oral Daily  . atorvastatin  80 mg Oral Daily  . furosemide  40 mg Oral Daily  . metoprolol tartrate  25 mg Oral BID  . mometasone-formoterol  2 puff Inhalation BID  . sodium chloride flush  3 mL Intravenous Q12H  . tiotropium  18 mcg Inhalation Daily     ASSESSMENT AND PLAN:  1. Dyspnea/hypoxia: Likely multifactorial with underlying COPD, CAD with recent CABG and persistent atrial fibrillation but it is felt that the acute change is due to his recurrent pleural effusions. He diuresed 5 liters since admission with no improvement in his dyspnea and does not appear to be volume overloaded at this time. Dr. Laneta SimmersBartle inserted bilateral PleurX catheters with pleurodesis on 05/11/16. OK to d/c home today per CT surgery.    2. Persistent atrial fibrillation: He is rate controlled, continue metoprolol. Eliquis will be restarted today. Cannot cardiovert right now  as anti-coagulation may have to be interrupted over next month for other procedures.   3. Acute on chronic systolic/diastolic congestive heart failure: Volume status is ok. Continue Lasix 40 mg po QDaily.   4. HTN: Blood pressure controlled. Continue on current medication for now.   5. HLD: He is on a statin  6. Severe AS: s/p aortic valve replacement using 25 mm Edwards Magna-ease pericardial valve march 2017  7. Thoracic aortic aneurysm: Dilated ascending aorta s/p supra-coronary ascending aortic and hemi-arch replacement using a 30 mm Hemashield per Dr. Laneta SimmersBartle in March 2017.   Will d/c home today. He will have f/u planned with Dr. Laneta SimmersBartle. He will need to see me or office APP in our office in 1-2 weeks.   Craig [name]  7/6/20178:59 AM

## 2016-05-12 NOTE — Progress Notes (Signed)
1 Day Post-Op Procedure(s) (LRB): INSERTION PLEURAL DRAINAGE CATHETER  (Bilateral) PLEURADESIS (Bilateral) Subjective:  Mild pain from catheter insertion but controlled. Breathing ok but has not been up moving yet today. Eating in bed.  Objective: Vital signs in last 24 hours: Temp:  [97.5 F (36.4 C)-98.6 F (37 C)] 98.6 F (37 C) (07/06 0508) Pulse Rate:  [61-117] 81 (07/06 0508) Cardiac Rhythm:  [-] Atrial fibrillation (07/05 2009) Resp:  [13-18] 16 (07/06 0508) BP: (99-183)/(67-120) 99/67 mmHg (07/06 0508) SpO2:  [90 %-99 %] 95 % (07/06 0508) Weight:  [53.071 kg (117 lb)] 53.071 kg (117 lb) (07/06 0500)  Hemodynamic parameters for last 24 hours:    Intake/Output from previous day: 07/05 0701 - 07/06 0700 In: 730 [P.O.:480; I.V.:250] Out: 510 [Urine:500; Blood:10] Intake/Output this shift: Total I/O In: -  Out: 150 [Urine:150]  General appearance: alert and cooperative Heart: regular rate and rhythm, S1, S2 normal, no murmur, click, rub or gallop Lungs: diminished breath sounds bibasilar  Lab Results:  Recent Labs  05/11/16 0317 05/12/16 0330  WBC 7.4 9.2  HGB 11.1* 12.5*  HCT 35.8* 40.7  PLT 180 173   BMET:  Recent Labs  05/11/16 0317  NA 139  K 4.8  CL 100*  CO2 32  GLUCOSE 124*  BUN 25*  CREATININE 1.35*  CALCIUM 9.0    PT/INR: No results for input(s): LABPROT, INR in the last 72 hours. ABG    Component Value Date/Time   PHART 7.366 01/12/2016 1529   HCO3 33.4* 01/12/2016 1529   TCO2 41 01/21/2016 0849   ACIDBASEDEF 3.0* 01/11/2016 0728   O2SAT 53.2 01/28/2016 0622   CBG (last 3)  No results for input(s): GLUCAP in the last 72 hours.   CXR: small bilateral pleural effusions.  Assessment/Plan: S/P Procedure(s) (LRB): INSERTION PLEURAL DRAINAGE CATHETER  (Bilateral) PLEURADESIS (Bilateral)  He is doing well following insertion of PleurX catheters yesterday with doxycycline pleurodesis. Will have catheters drained this morning and  then can go home if ok with cardiology. He can restart anticoagulant for atrial fib. He and sister will drain the catheters daily at home. They are very familiar with doing that because they drained the previous catheters and took good care of the exit sites with dressings. He will need to call my office for a follow up appt the week after next with a CXR beforehand and I discussed that with him.   LOS: 6 days    Alleen BorneBryan K Bartle 05/12/2016

## 2016-05-12 NOTE — Progress Notes (Signed)
SATURATION QUALIFICATIONS: (This note is used to comply with regulatory documentation for home oxygen)  Patient Saturations on Room Air at Rest = 82%  Patient Saturations on Room Air while Ambulating = 73%  Patient Saturations on 2 Liters of oxygen while Ambulating = 92%  Please briefly explain why patient needs home oxygen: yes pt needs oxygen. Pt had low o2 sats at rest. Ambulation makes o2 sats on room air worse.

## 2016-05-12 NOTE — Telephone Encounter (Signed)
Pt discharged from the hospital today.  Triage to follow-up with the pt tomorrow 7/7 for TCM call.

## 2016-05-12 NOTE — Telephone Encounter (Signed)
New message    TOC appt on  7.17.2017 per Orion CrookErin Strader

## 2016-05-12 NOTE — Care Management Note (Addendum)
Case Management Note  Patient Details  Name: Levert FeinsteinRobert S Mcbrayer MRN: [id number] Date of Birth: 06/12/1951  Subjective/Objective:            S/p Pleurx drain catheter placement        Action/Plan:  PTA independent from home with help from sister - pt will discharge to sisters home .  Pt and sister has already received training on pleurx drainage on previous admit - pt has hx of pleurx insertions.  Pt refused HHRN to assist with daily drainage - attending aware and in agreement that pt has the knowledge and capcity to manage at home without the help of HHRN.  CM confirmed this with both bedside nurse, pt and sister, CM also verified with pt and sister that both sites will be drained daily.  Pt needs home 02, [facility] - agency contacted and referral accepted.  CM verified address and phone number with pt - [facility] documents [facility] and provided original to CME office to be mailed out according to protocol.  Pt sent home with 18 kits to ensure that he has enough kits for bilateral daily drainage.     Expected Discharge Date:                  Expected Discharge Plan:  Home/Self Care  In-House Referral:     Discharge planning Services  CM Consult  Post Acute Care Choice:    Choice offered to:     DME Arranged:    DME Agency:     HH Arranged:    HH Agency:     Status of Service:  Completed, signed off  If discussed at [employer] Length of Stay Meetings, dates discussed:    Additional Comments:  Cherylann ParrClaxton, Candra Wegner S, RN 05/12/2016, 1:53 PM

## 2016-05-12 NOTE — Progress Notes (Signed)
Radiology called to report pt's xray showed chest tubes in place but pt has a tiny pneumothorax. Day RN and MD notified. Will monitor pt closely.

## 2016-05-13 NOTE — Telephone Encounter (Signed)
[facility] on 05/12/[facility].  Patient understands to follow up with provider [name] [name] on 05/23/16 at 9:00am [facility].  Patient understands discharge instructions? Yes Patient understands medications and regimen? Yes Patient understands to bring all medications to this visit? Yes  Pt asked that I contact his sister, [name] [name] to confirm medications because she handles these for him. Pt states he is wearing his O2, denies SOB, states his right and left pleurx catheters are clamped.  His sister comes to drain them once a day and records drainage.  Spoke with patient's sister. She has noted the metoprolol change to 12.5 mg twice daily and is splitting his tablets. She asked what his BP should be.  It was noted to be 93/60 on discharge.  She states in April/May it was running 100s/60s. Advised her if BP remains in the 90s systolic to contact cardiology for possible further medication adjustments.

## 2016-05-23 ENCOUNTER — Encounter: Payer: Self-pay | Admitting: Nurse Practitioner

## 2016-05-23 ENCOUNTER — Telehealth: Payer: Self-pay | Admitting: Cardiovascular Disease

## 2016-05-23 ENCOUNTER — Ambulatory Visit (INDEPENDENT_AMBULATORY_CARE_PROVIDER_SITE_OTHER): Payer: Medicaid Other | Admitting: Nurse Practitioner

## 2016-05-23 VITALS — BP 98/68 | Ht 69.0 in | Wt 116.8 lb

## 2016-05-23 DIAGNOSIS — J449 Chronic obstructive pulmonary disease, unspecified: Secondary | ICD-10-CM | POA: Diagnosis not present

## 2016-05-23 DIAGNOSIS — R0600 Dyspnea, unspecified: Secondary | ICD-10-CM

## 2016-05-23 LAB — BASIC METABOLIC PANEL
BUN: 22 mg/dL (ref 7–25)
CO2: 29 mmol/L (ref 20–31)
Calcium: 8.5 mg/dL — ABNORMAL LOW (ref 8.6–10.3)
Chloride: 102 mmol/L (ref 98–110)
Creat: 1.1 mg/dL (ref 0.70–1.25)
Glucose, Bld: 117 mg/dL — ABNORMAL HIGH (ref 65–99)
Potassium: 3.2 mmol/L — ABNORMAL LOW (ref 3.5–5.3)
Sodium: 141 mmol/L (ref 135–146)

## 2016-05-23 LAB — CBC
HCT: 35.5 % — ABNORMAL LOW (ref 38.5–50.0)
Hemoglobin: 11.8 g/dL — ABNORMAL LOW (ref 13.2–17.1)
MCH: 31.2 pg (ref 27.0–33.0)
MCHC: 33.2 g/dL (ref 32.0–36.0)
MCV: 93.9 fL (ref 80.0–100.0)
MPV: 10.8 fL (ref 7.5–12.5)
Platelets: 280 10*3/uL (ref 140–400)
RBC: 3.78 MIL/uL — ABNORMAL LOW (ref 4.20–5.80)
RDW: 16.1 % — ABNORMAL HIGH (ref 11.0–15.0)
WBC: 9 10*3/uL (ref 3.8–10.8)

## 2016-05-23 LAB — BRAIN NATRIURETIC PEPTIDE: Brain Natriuretic Peptide: 315 pg/mL — ABNORMAL HIGH (ref <100)

## 2016-05-23 MED ORDER — ATORVASTATIN CALCIUM 80 MG PO TABS: 80.0000 mg | ORAL_TABLET | Freq: Every day | ORAL | Status: DC

## 2016-05-23 MED ORDER — FUROSEMIDE 40 MG PO TABS: 40.0000 mg | ORAL_TABLET | Freq: Every day | ORAL | Status: DC

## 2016-05-23 NOTE — Patient Instructions (Signed)
We will be checking the following labs today - BMET, BNP and CBC   Medication Instructions:   Continue with your current medicines.  I refilled your Lasix and Lipitor today   Testing/Procedures To Be Arranged:  N/A  Follow-Up:   See Dr. Clifton JamesMcAlhany back as planned with EKG    Other Special Instructions:   N/A    If you need a refill on your cardiac medications before your next appointment, please call your pharmacy.   Call the [facility] office at [phone] if you have any questions, problems or concerns.

## 2016-05-23 NOTE — Progress Notes (Signed)
CARDIOLOGY OFFICE NOTE  Date:  05/23/2016    Craig [name] Date of Birth: July 07, 1951 Medical Record #[id number]  PCP:  Rose Fillers, PA-C  Cardiologist:  [facility]  Chief Complaint  Patient presents with  . Shortness of Breath    TOC/post hospital visit - seen for Dr. Clifton James    History of Present Illness: Craig [name] is a 65 y.o. male who presents today for a post hospital/TOC visit. Seen for Dr. Clifton James.   He has a past medical history of CAD s/p CABG, severe aortic stenosis s/p AVR, thoracic aortic aneurysm s/p root replacement, COPD, HTN, HLD, and tobacco abuse. He had AVR with pericardial valve and aortic root replacement on 01/07/16 and developed post operative atrial fibrillation (on Eliquis) and pleural effusions. He was sent home with PleurX catheters for recurrent effusions that were eventually discontinued on 04/15/16 after his effusions had resolved.   He presented to Dr. Gibson Ramp office on 05/06/16 and was hypoxic with O2 sats of 77% on room air. He was admitted for further observation as the etiology of his dyspnea was felt to be multi-factorial as he has COPD, chronic combined systolic and diastolic CHF, and recent history of recurrent pleural effusions.   Mr. Abdalla's D-dimer was elevated at 6.16, his troponin was 0.05. Chest X ray showed small bilateral pleural effusions. Chest CT showed no evidence of PE. There was an initial plan of doing TEE/DCCV since he had been on Eliquis for 9 days already however, it was felt that he needed reinsertion of the PleurX catheters so his anti coagulation would have to be placed on hold, thus making it not possible to due TEE/DCCV.   He was diuresed over 5 L during admission without any improvement in his dyspnea, thus further assuring [place] that his dyspnea is likely related to his COPD and pleural effusions.   Bilateral PleurX catheter insertion and pleurodesis with Doxycycline completed on 05/11/16 without  complication.   His Eliquis was restarted on 05/12/16. His family is very familiar with how to take care of PleurX drain site as well as how to use the drain. They have done well in the past. He will need to follow up with CVTS next week.   His atrial fibrillation is rate controlled with metoprolol, noted that we are to revisit the idea of cardioversion at a later time. His metoprolol was decreased upon discharge to 12.5mg  BID as he was hypotensive at times during admission with SBP's in the 90's. He was discharged home with oxygen.   Comes back today. Here with his sister [name]. He says he is doing ok. Not weighing every day. Says he is restricting his salt but eating TV dinners and had steak and cheese last night.  Still with drainage from the pleurex tubes - more on the right side and notes that drainage bloody. Less color in the drainage from the left. Breathing is much better. Using his oxygen only as needed. Walking more without getting too breathless. No real awareness of his AF whatsoever. No swelling.   Past Medical History  Diagnosis Date  . COPD (chronic obstructive pulmonary disease) (HCC)     severe on PFT prior to CABG  . Hypertension   . Hyperlipidemia   . Alcohol abuse     Prior  . Tobacco abuse     Prior  . CAD (coronary artery disease) of bypass graft      4v CABG (LIMA to LAD, SVG to OM, seq  SVG to PDA/PLA) by Dr. Laneta Simmers 01/07/2016  . dilated ascending aorta     s/p supra-coronary ascending aortic and hemi-arch replacement using a 30 mm Hemashield under deep hypothermic circulatory arrest during CABG 01/07/2016  . Severe aortic stenosis     s/p aortic valve replacement using 25 mm Edwards Magna-ease pericardial valve by Dr. Laneta Simmers during CABG 01/07/2016  . Persistent atrial fibrillation (HCC)     in the setting of NSTEMI and persisted after [facility] 01/2016  . Recurrent left pleural effusion     s/p PleurX drain, occured after CABG  . Recurrent right pleural effusion     s/p  PleurX drain, occured after CABG    Past Surgical History  Procedure Laterality Date  . Ruptured disc repair  2009 or 2010  . Cardiac catheterization N/A 12/29/2015    Procedure: Right/Left Heart Cath and Coronary Angiography;  Surgeon: Peter M [place], MD;  Location: [facility] INVASIVE CV LAB;  Service: Cardiovascular;  Laterality: N/A;  . Coronary artery bypass graft N/A 01/07/2016    Procedure: CORONARY ARTERY BYPASS GRAFTING (CABG), ON PUMP, TIMES FOUR, USING LEFT INTERNAL MAMMARY ARTERY, BILATERAL GREATER SAPHENOUS VEINS HARVESTED ENDOSCOPICALLY;  Surgeon: Alleen Borne, MD;  Location: [facility] OR;  Service: Open Heart Surgery;  Laterality: N/A;  LIMA to LAD, SVG to OM, SVG SEQUENTIALLY to PDA and PLB  . Tee without cardioversion N/A 01/07/2016    Procedure: TRANSESOPHAGEAL ECHOCARDIOGRAM (TEE);  Surgeon: Alleen Borne, MD;  Location: [facility] OR;  Service: Open Heart Surgery;  Laterality: N/A;  . Thoracic aortic aneurysm repair N/A 01/07/2016    Procedure: CIRC ARREST AND REPLACEMENT OF ASCENDING THORACIC  ANEURYSM;  Surgeon: Alleen Borne, MD;  Location: [facility] OR;  Service: Open Heart Surgery;  Laterality: N/A;  . Chest tube insertion Bilateral 01/25/2016    Procedure: INSERTION Bilateral PLEURAL DRAINAGE CATHETER;  Surgeon: Alleen Borne, MD;  Location: [facility] OR;  Service: Thoracic;  Laterality: Bilateral;  . Removal of pleural drainage catheter Left 03/17/2016    Procedure: REMOVAL OF PLEURAL DRAINAGE CATHETER;  Surgeon: Alleen Borne, MD;  Location: [facility] OR;  Service: Thoracic;  Laterality: Left;  . Chest tube insertion Bilateral 05/11/2016    Procedure: INSERTION PLEURAL DRAINAGE CATHETER ;  Surgeon: Alleen Borne, MD;  Location: [facility] OR;  Service: Thoracic;  Laterality: Bilateral;  . Pleuradesis Bilateral 05/11/2016    Procedure: PLEURADESIS;  Surgeon: Alleen Borne, MD;  Location[facility] OR;  Service: Thoracic;  Laterality: Bilateral;     Medications: Current Outpatient Prescriptions  Medication Sig Dispense Refill    . apixaban (ELIQUIS) 5 MG TABS tablet Take 1 tablet (5 mg total) by mouth 2 (two) times daily. 60 tablet 5  . aspirin 81 MG EC tablet Take 1 tablet (81 mg total) by mouth daily. 30 tablet 1  . atorvastatin (LIPITOR) 80 MG tablet Take 1 tablet (80 mg total) by mouth daily. 90 tablet 3  . budesonide-formoterol (SYMBICORT) 160-4.5 MCG/ACT inhaler Inhale 2 puffs into the lungs 2 (two) times daily as needed (For shortness of breath.).     Marland Kitchen furosemide (LASIX) 40 MG tablet Take 1 tablet (40 mg total) by mouth daily. 90 tablet 3  . metoprolol tartrate (LOPRESSOR) 25 MG tablet Take 0.5 tablets (12.5 mg total) by mouth 2 (two) times daily. 30 tablet 12  . nitroGLYCERIN (NITROSTAT) 0.4 MG SL tablet Place 1 tablet (0.4 mg total) under the tongue every 5 (five) minutes x 3 doses as needed for chest pain. 25  tablet 1  . sodium chloride (OCEAN) 0.65 % SOLN nasal spray Place 1 spray into both nostrils as needed for congestion.    Marland Kitchen tiotropium (SPIRIVA) 18 MCG inhalation capsule Place 18 mcg into inhaler and inhale daily as needed (For shortness of breath.).     Marland Kitchen traMADol (ULTRAM) 50 MG tablet Take 1 tablet (50 mg total) by mouth every 6 (six) hours as needed for moderate pain. 30 tablet 0   No current facility-administered medications for this visit.    Allergies: No Known Allergies  Social History: The patient  reports that he has quit smoking. He has never used smokeless tobacco. He reports that he does not drink alcohol or use illicit drugs.   Family History: The patient's family history includes Diabetes in his father; Kidney disease in his brother, father, and sister; Stroke in his father and mother. There is no history of Heart attack.   Review of Systems: Please see the history of present illness.   Otherwise, the review of systems is positive for none.   All other systems are reviewed and negative.   Physical Exam: VS:  BP 98/68 mmHg  Ht  (1.753 m)  Wt 116 lb 12.8 oz (52.98 kg)  BMI  17.24 kg/m2 .  BMI Body mass index is 17.24 kg/(m^2).  Wt Readings from Last 3 Encounters:  05/23/16 116 lb 12.8 oz (52.98 kg)  05/12/16 117 lb (53.071 kg)  05/06/16 121 lb 6.4 oz (55.067 kg)    General: Pleasant. He is quite thin. Looks chronically ill but in no acute distress. Color is pale.  HEENT: Normal. Neck: Supple, no JVD, carotid bruits, or masses noted.  Cardiac: Irregular irregular rhythm. Rate is controlled. Heart tones are distant. No edema.  Respiratory:  Lungs with decreased breath sounds. He has normal work of breathing. He has bilateral pleurex dressings in place.   GI: Soft and nontender.  MS: No deformity or atrophy. Gait and ROM intact. Skin: Warm and dry. Color is normal.  Neuro:  Strength and sensation are intact and no gross focal deficits noted.  Psych: Alert, appropriate and with normal affect.   LABORATORY DATA:  EKG:  EKG is ordered today. This demonstrates atrial fib with a controlled VR   Lab Results  Component Value Date   WBC 9.2 05/12/2016   HGB 12.5* 05/12/2016   HCT 40.7 05/12/2016   PLT 173 05/12/2016   GLUCOSE 124* 05/11/2016   CHOL 121 05/07/2016   TRIG 100 05/07/2016   HDL 42 05/07/2016   LDLCALC 59 05/07/2016   ALT 30 05/06/2016   AST 32 05/06/2016   NA 139 05/11/2016   K 4.8 05/11/2016   CL 100* 05/11/2016   CREATININE 1.35* 05/11/2016   BUN 25* 05/11/2016   CO2 32 05/11/2016   TSH 2.799 05/06/2016   INR 1.4 04/25/2016   HGBA1C  05/28/2009    5.7 (NOTE) The ADA recommends the following therapeutic goal for glycemic control related to Hgb A1c measurement: Goal of therapy: <6.5 Hgb A1c  Reference: American Diabetes Association: Clinical Practice Recommendations 2010, Diabetes Care, 2010, 33: (Suppl  1).    BNP (last 3 results)  Recent Labs  12/27/15 1703 05/06/16 1233  BNP 1306.4* 589.9*    ProBNP (last 3 results) No results for input(s): PROBNP in the last 8760 hours.   Other Studies Reviewed  Today:  Transthoracic Echocardiography Left ventricle: The cavity size was normal. Wall thickness was  increased in a pattern of mild LVH. Systolic  function was normal.  The estimated ejection fraction was in the range of 55% to 60%.  Wall motion was normal; there were no regional wall motion  abnormalities. - Ventricular septum: The contour showed diastolic flattening and  systolic flattening. - Aortic valve: A bioprosthesis was present. There was trivial  regurgitation. - Mitral valve: There was mild regurgitation. - Left atrium: The atrium was mildly dilated. - Pericardium, extracardiac: A small pericardial effusion was  identified. There was a left pleural effusion.  Impressions:  - Normal LV systolic function; mild LVH; septal flattening  suggestive of pulmonary hypertension; miild LAE; s/p AVR with  normal gradient and trace AI; mild MR.  Assessment/Plan:  1. Dyspnea/hypoxia: Likely multifactorial with underlying COPD, CAD with recent CABG and persistent atrial fibrillation but it is felt that the acute change was due to his recurrent pleural effusions. He diuresed 5 liters this last admission with no improvement in his dyspnea and did not appear to be volume overloaded at this time. Dr. Laneta SimmersBartle inserted bilateral PleurX catheters with pleurodesis on 05/11/16. He has now had considerable improvement in his symptoms.   2. Persistent atrial fibrillation: He is rate controlled, continue metoprolol. Back on his Eliquis. He has had some bloody drainage from the right pleurx cath - seeing Dr. Laneta SimmersBartle later this week. Would not cardiovert right now as anti-coagulation may have to be interrupted over next month for other procedures. He is also asymptomatic - may be reasonable to just manage with rate control and continued anticoagulation.   3. Chronic systolic/diastolic congestive heart failure: Volume status is ok. Continue Lasix 40 mg po QDaily. He gets too much salt but with  his social situation, I do not really see this changing.    4. HTN: Blood pressure controlled/low normal. Continue on current medication for now.   5. HLD: He is on a statin  6. Severe AS: s/p aortic valve replacement using 25 mm Edwards Magna-ease pericardial valve march 2017  7. Thoracic aortic aneurysm: Dilated ascending aorta s/p supra-coronary ascending aortic and hemi-arch replacement using a 30 mm Hemashield per Dr. Laneta SimmersBartle in March 2017.  8. Overall failure to thrive   Current medicines are reviewed with the patient today.  The patient does not have concerns regarding medicines other than what has been noted above.  The following changes have been made:  See above.  Labs/ tests ordered today include:    Orders Placed This Encounter  Procedures  . Brain natriuretic peptide  . Basic metabolic panel  . CBC  . EKG 12-Lead     Disposition:   FU with Dr. Clifton JamesMcAlhany and team in one month with repeat EKG.  Patient is agreeable to this plan and will call if any problems develop in the interim.   Signed: Rosalio MacadamiaLori C. Jet Traynham, RN, ANP-C 05/23/2016 9:31 AM  [facility] [address] [place], [place]  [address] Phone: [phone] Fax: [phone]

## 2016-05-23 NOTE — Telephone Encounter (Signed)
S/W Norma FredricksonLori Gerhardt, NP, stated when pt comes back for ov with Jacolyn ReedyMichele Lenze, PA, pt will get lipid/lft.  Noted in comments in appointments. Sister per DPR is aware and appreciative. Lab appointment cancelled.

## 2016-05-23 NOTE — Telephone Encounter (Signed)
New message   Pts sister wants to know if the pt needs to have another lab next week (7/24). She thinks it needs to be moved to another day.

## 2016-05-26 ENCOUNTER — Ambulatory Visit: Payer: Medicaid Other | Admitting: Surgery

## 2016-05-26 ENCOUNTER — Other Ambulatory Visit: Payer: Self-pay | Admitting: Surgery

## 2016-05-26 ENCOUNTER — Telehealth: Payer: Self-pay

## 2016-05-26 DIAGNOSIS — I25119 Atherosclerotic heart disease of native coronary artery with unspecified angina pectoris: Secondary | ICD-10-CM

## 2016-05-26 DIAGNOSIS — I480 Paroxysmal atrial fibrillation: Secondary | ICD-10-CM

## 2016-05-26 MED ORDER — POTASSIUM CHLORIDE ER 20 MEQ PO TBCR: 20.0000 meq | EXTENDED_RELEASE_TABLET | Freq: Every day | ORAL | Status: DC

## 2016-05-26 NOTE — Telephone Encounter (Signed)
Informed patient of results and verbal understanding expressed.  Instructed patient to START KDUR 20 meq daily. Repeat BMET scheduled 7/27. Patient agrees with treatment plan.

## 2016-05-26 NOTE — Telephone Encounter (Signed)
-----   Message from Rosalio MacadamiaLori C Gerhardt, NP sent at 05/24/2016  7:44 AM EDT ----- Ok to report. Labs are stable but his potassium is low - would add Kdur 20 meq daily - BMET in one week. Otherwise would continue on current regimen.

## 2016-05-27 ENCOUNTER — Ambulatory Visit (INDEPENDENT_AMBULATORY_CARE_PROVIDER_SITE_OTHER): Payer: Medicaid Other | Admitting: Surgery

## 2016-05-27 ENCOUNTER — Ambulatory Visit
Admission: RE | Admit: 2016-05-27 | Discharge: 2016-05-27 | Disposition: A | Discharge status: Home / Self Care | Payer: Medicaid Other | Source: Ambulatory Visit | Attending: Surgery | Admitting: Surgery

## 2016-05-27 ENCOUNTER — Encounter: Payer: Self-pay | Admitting: Surgery

## 2016-05-27 VITALS — BP 119/75 | HR 72 | Resp 16 | Ht 69.0 in | Wt 116.0 lb

## 2016-05-27 DIAGNOSIS — J90 Pleural effusion, not elsewhere classified: Secondary | ICD-10-CM | POA: Diagnosis not present

## 2016-05-27 DIAGNOSIS — I25119 Atherosclerotic heart disease of native coronary artery with unspecified angina pectoris: Secondary | ICD-10-CM

## 2016-05-27 DIAGNOSIS — Z938 Other artificial opening status: Secondary | ICD-10-CM | POA: Diagnosis not present

## 2016-05-27 NOTE — Progress Notes (Signed)
HPI:  He returns today to follow up on his bilateral PleurX catheters. He has been draining them every other day at home and the right catheter drained 475 and 425 cc the past two times. The left catheter has drained 200 and 300 cc the past two times. He says that his breathing has been good and his appetite is better. He is walking well. He notes that his legs have swelled up over the past day.  Current Outpatient Prescriptions  Medication Sig Dispense Refill  . apixaban (ELIQUIS) 5 MG TABS tablet Take 1 tablet (5 mg total) by mouth 2 (two) times daily. 60 tablet 5  . aspirin 81 MG EC tablet Take 1 tablet (81 mg total) by mouth daily. 30 tablet 1  . atorvastatin (LIPITOR) 80 MG tablet Take 1 tablet (80 mg total) by mouth daily. 90 tablet 3  . budesonide-formoterol (SYMBICORT) 160-4.5 MCG/ACT inhaler Inhale 2 puffs into the lungs 2 (two) times daily as needed (For shortness of breath.).     Marland Kitchen. furosemide (LASIX) 40 MG tablet Take 1 tablet (40 mg total) by mouth daily. 90 tablet 3  . metoprolol tartrate (LOPRESSOR) 25 MG tablet Take 0.5 tablets (12.5 mg total) by mouth 2 (two) times daily. 30 tablet 12  . nitroGLYCERIN (NITROSTAT) 0.4 MG SL tablet Place 1 tablet (0.4 mg total) under the tongue every 5 (five) minutes x 3 doses as needed for chest pain. 25 tablet 1  . potassium chloride 20 MEQ TBCR Take 20 mEq by mouth daily. 30 tablet 11  . sodium chloride (OCEAN) 0.65 % SOLN nasal spray Place 1 spray into both nostrils as needed for congestion.    Marland Kitchen. tiotropium (SPIRIVA) 18 MCG inhalation capsule Place 18 mcg into inhaler and inhale daily as needed (For shortness of breath.).     Marland Kitchen. traMADol (ULTRAM) 50 MG tablet Take 1 tablet (50 mg total) by mouth every 6 (six) hours as needed for moderate pain. 30 tablet 0   No current facility-administered medications for this visit.     Physical Exam: BP 119/75 mmHg  Pulse 72  Resp 16  Ht 5\' 9"  (1.753 m)  Wt 116 lb (52.617 kg)  BMI 17.12 kg/m2   SpO2 95% He looks well Lung exam shows slight decrease in breath sounds in the bases Heart: IRRR. Ext: mild edema in ankles and feet.  Diagnostic Tests:  CLINICAL DATA: 65 year old who underwent CABG on 01/07/2016 and had chronic persistent pleural effusions for which PleurX drainage catheters were placed. Followup effusions.  EXAM: CHEST 2 VIEW  COMPARISON: 05/12/2016, 05/11/2016 and earlier, including CTA chest 05/06/2016.  FINDINGS: Sternotomy for CABG and aortic valve replacement. Cardiac silhouette normal in size, unchanged. Thoracic aorta atherosclerotic, unchanged. Hilar and mediastinal contours otherwise unremarkable. Near complete resolution of the pleural effusions with the PleurX drainage catheters in place, with only very small effusions persisting bilaterally. Improved aeration in both lower lobes since the most recent prior examination, with only minimal residual atelectasis persisting. Lungs otherwise clear. Pulmonary vascularity normal. Multiple healed bilateral rib fractures.  IMPRESSION: 1. Near complete resolution of bilateral pleural effusions with PleurX drainage catheters in place. Very small effusions persist bilaterally. 2. Improved aeration in both lower lobes, with only minimal residual atelectasis persisting. No acute cardiopulmonary disease otherwise. 3. Stable normal heart size without evidence of pulmonary edema. 4. Thoracic aortic atherosclerosis.   Electronically Signed  By: Hulan Saashomas Lawrence M.D.  On: 05/27/2016 14:15   Impression:  The CXR looks good with  minimal effusion after two days. The catheters are still draining some so I think they should stay in for now. I think it is imperative that he have optimal heart failure management since this is the most likely etiology.  Plan:  I will see him back in three weeks unless the catheters quit draining before that. He will continue drainage every other day.   Alleen Borne, MD [facility] [phone]

## 2016-05-30 ENCOUNTER — Other Ambulatory Visit: Payer: Self-pay

## 2016-06-02 ENCOUNTER — Other Ambulatory Visit: Payer: Medicaid Other | Admitting: *Deleted

## 2016-06-02 DIAGNOSIS — I480 Paroxysmal atrial fibrillation: Secondary | ICD-10-CM

## 2016-06-02 LAB — BASIC METABOLIC PANEL
BUN: 24 mg/dL (ref 7–25)
CO2: 27 mmol/L (ref 20–31)
Calcium: 8.4 mg/dL — ABNORMAL LOW (ref 8.6–10.3)
Chloride: 101 mmol/L (ref 98–110)
Creat: 1.19 mg/dL (ref 0.70–1.25)
Glucose, Bld: 112 mg/dL — ABNORMAL HIGH (ref 65–99)
Potassium: 4 mmol/L (ref 3.5–5.3)
Sodium: 138 mmol/L (ref 135–146)

## 2016-06-09 ENCOUNTER — Ambulatory Visit: Payer: Self-pay | Admitting: Cardiovascular Disease

## 2016-06-14 ENCOUNTER — Other Ambulatory Visit: Payer: Self-pay | Admitting: Surgery

## 2016-06-14 DIAGNOSIS — J90 Pleural effusion, not elsewhere classified: Secondary | ICD-10-CM

## 2016-06-15 ENCOUNTER — Encounter: Payer: Self-pay | Admitting: Surgery

## 2016-06-15 ENCOUNTER — Ambulatory Visit (INDEPENDENT_AMBULATORY_CARE_PROVIDER_SITE_OTHER): Payer: Medicaid Other | Admitting: Surgery

## 2016-06-15 ENCOUNTER — Ambulatory Visit
Admission: RE | Admit: 2016-06-15 | Discharge: 2016-06-15 | Disposition: A | Discharge status: Home / Self Care | Payer: Medicaid Other | Source: Ambulatory Visit | Attending: Surgery | Admitting: Surgery

## 2016-06-15 VITALS — BP 93/64 | HR 76 | Resp 16 | Ht 69.0 in | Wt 116.0 lb

## 2016-06-15 DIAGNOSIS — J90 Pleural effusion, not elsewhere classified: Secondary | ICD-10-CM

## 2016-06-15 DIAGNOSIS — Z938 Other artificial opening status: Secondary | ICD-10-CM | POA: Diagnosis not present

## 2016-06-15 NOTE — H&P (Signed)
HPI:  He returns today to follow up on his bilateral PleurX catheters. He has been draining them every third day at home. The right catheter has drained 300 cc over the three day period and the left has drained 150 cc. He has been feeling well and is eating well, ambulating, feeling stronger. No shortness of breath.  Current Outpatient Prescriptions  Medication Sig Dispense Refill  . apixaban (ELIQUIS) 5 MG TABS tablet Take 1 tablet (5 mg total) by mouth 2 (two) times daily. 60 tablet 5  . aspirin 81 MG EC tablet Take 1 tablet (81 mg total) by mouth daily. 30 tablet 1  . atorvastatin (LIPITOR) 80 MG tablet Take 1 tablet (80 mg total) by mouth daily. 90 tablet 3  . budesonide-formoterol (SYMBICORT) 160-4.5 MCG/ACT inhaler Inhale 2 puffs into the lungs 2 (two) times daily as needed (For shortness of breath.).     Marland Kitchen furosemide (LASIX) 40 MG tablet Take 1 tablet (40 mg total) by mouth daily. 90 tablet 3  . metoprolol tartrate (LOPRESSOR) 25 MG tablet Take 0.5 tablets (12.5 mg total) by mouth 2 (two) times daily. 30 tablet 12  . nitroGLYCERIN (NITROSTAT) 0.4 MG SL tablet Place 1 tablet (0.4 mg total) under the tongue every 5 (five) minutes x 3 doses as needed for chest pain. 25 tablet 1  . potassium chloride 20 MEQ TBCR Take 20 mEq by mouth daily. 30 tablet 11  . sodium chloride (OCEAN) 0.65 % SOLN nasal spray Place 1 spray into both nostrils as needed for congestion.    Marland Kitchen tiotropium (SPIRIVA) 18 MCG inhalation capsule Place 18 mcg into inhaler and inhale daily as needed (For shortness of breath.).     Marland Kitchen traMADol (ULTRAM) 50 MG tablet Take 1 tablet (50 mg total) by mouth every 6 (six) hours as needed for moderate pain. (Patient not taking: Reported on 06/15/2016) 30 tablet 0   No current facility-administered medications for this visit.      Physical Exam: BP 93/64   Pulse 76   Resp 16   Ht  (1.753 m)   Wt 116 lb (52.6 kg)   SpO2 96% Comment: ON RA  BMI 17.13 kg/m  He looks  well Lung exam shows slight decrease in breath sounds in the bases Heart: IRRR. Ext: mild edema in ankles and feet.  Diagnostic Tests:  CLINICAL DATA:  CABG.  EXAM: CHEST  2 VIEW  COMPARISON:  05/27/2016.  05/12/2016.  04/13/2016 .  FINDINGS: Prior CABG and cardiac valve replacement. Heart size normal. No focal infiltrate. Bilateral PleurX catheters noted. Minimal pleural effusions. No pneumothorax. Old bilateral rib fractures.  IMPRESSION: 1.  Prior CABG and cardiac valve replacement.  2. Bilateral PleurX catheters noted in stable position with minimal residual pleural effusions. No pneumothorax.   Electronically Signed   By: Maisie Fus  Register   On: 06/15/2016 10:19   Impression:  The drainage has continued to decrease and is now 100 cc or less per day on average. Since this is the second set of catheters that he has had I think it would be best to stop draining them for two weeks and see how he does symptomatically and check a CXR. If he does well and the CXR showing no significant recurrence then I would remove them. I told him that if he develops SOB he should drain them and let me know. He will continue to change the catheter dressings every three days.  Plan:  Stop draining the  catheters and follow up with me in 2 weeks with a CXR.   Alleen BorneBryan K Bartle, MD [facility] [phone]

## 2016-06-20 ENCOUNTER — Ambulatory Visit (INDEPENDENT_AMBULATORY_CARE_PROVIDER_SITE_OTHER): Payer: Medicaid Other | Admitting: Physician Assistant

## 2016-06-20 ENCOUNTER — Encounter: Payer: Self-pay | Admitting: Physician Assistant

## 2016-06-20 VITALS — BP 100/70 | HR 74 | Ht 69.0 in | Wt 119.4 lb

## 2016-06-20 DIAGNOSIS — I481 Persistent atrial fibrillation: Secondary | ICD-10-CM

## 2016-06-20 DIAGNOSIS — I2510 Atherosclerotic heart disease of native coronary artery without angina pectoris: Secondary | ICD-10-CM

## 2016-06-20 DIAGNOSIS — I4819 Other persistent atrial fibrillation: Secondary | ICD-10-CM

## 2016-06-20 DIAGNOSIS — I5042 Chronic combined systolic (congestive) and diastolic (congestive) heart failure: Secondary | ICD-10-CM | POA: Diagnosis not present

## 2016-06-20 LAB — BASIC METABOLIC PANEL
BUN: 17 mg/dL (ref 7–25)
CALCIUM: 8.8 mg/dL (ref 8.6–10.3)
CO2: 27 mmol/L (ref 20–31)
Chloride: 105 mmol/L (ref 98–110)
Creat: 1.12 mg/dL (ref 0.70–1.25)
GLUCOSE: 108 mg/dL — AB (ref 65–99)
POTASSIUM: 3.9 mmol/L (ref 3.5–5.3)
SODIUM: 139 mmol/L (ref 135–146)

## 2016-06-20 NOTE — Patient Instructions (Addendum)
Medication Instructions:  Your physician recommends that you continue on your current medications as directed. Please refer to the Current Medication list given to you today.  Labwork: YOU HAVE LAB WOR TODAY; FASTING LIPID AND LIVER PANEL ORDERED BY Norma FredricksonLORI GERHARDT, NP AND TODAY BMET BY MICHELE LENZE, PAC  Testing/Procedures: NONE  Follow-Up: NEEDS FOLLOW UP WITH DR. Clifton JamesMCALHANY WITH IN 1 MONTH PER MICHELE LENZE, PAC  Any Other Special Instructions Will Be Listed Below (If Applicable).     If you need a refill on your cardiac medications before your next appointment, please call your pharmacy.

## 2016-06-20 NOTE — Progress Notes (Signed)
Cardiology Office Note    Date:  06/20/2016   ID:  Craig [name], DOB Jun 29, 1951, MRN [id number]  PCP:  Rose Fillers, PA-C  Cardiologist: Dr. Clifton James  No chief complaint on file.   History of Present Illness:  Craig [name] is a 65 y.o. male  a past medical history of CAD s/p CABG, severe aortic stenosis s/p AVR, thoracic aortic aneurysm s/p root replacement, COPD, HTN, HLD, and tobacco abuse. He had AVR with pericardial valve and aortic root replacement on 01/07/16 and developed post operative atrial fibrillation (on Eliquis) and pleural effusions. He was sent home with PleurX catheters for recurrent effusions that were eventually discontinued on 04/15/16 after his effusions had resolved.   He presented to Dr. Gibson Ramp office on 05/06/16 and was hypoxic with O2 sats of 77% on room air. He was admitted for further observation as the etiology of his dyspnea was felt to be multi-factorial as he has COPD, chronic combined systolic and diastolic CHF, and recent history of recurrent pleural effusions.    Mr. Crihfield's D-dimer was elevated at 6.16, his troponin was 0.05. Chest X ray showed small bilateral pleural effusions. Chest CT showed no evidence of PE. There was an initial plan of doing TEE/DCCV since he had been on Eliquis for 9 days already however, it was felt that he needed reinsertion of the PleurX catheters so his anti coagulation would have to be placed on hold, thus making it not possible to due TEE/DCCV.    He was diuresed over 5 L during admission without any improvement in his dyspnea, thus further assuring [place] that his dyspnea is likely related to his COPD and pleural effusions.    Bilateral PleurX catheter insertion and pleurodesis with Doxycycline completed on 05/11/16 without complication.    His Eliquis was restarted on 05/12/16. His family is very familiar with how to take care of PleurX drain site as well as how to use the drain. They have done well in the past. He  will need to follow up with CVTS next week.    His atrial fibrillation is rate controlled with metoprolol, noted that we are to revisit the idea of cardioversion at a later time. His metoprolol was decreased upon discharge to 12.5mg  BID as he was hypotensive at times during admission with SBP's in the 90's. He was discharged home with oxygen.    Saw Craig Fredrickson, NP 05/2016 and was eating a lot of salty foods. Still had drainage from Pleurx tubes. Overall is doing well and no medication changes were done. Remained in atrial fibrillation but didn't plan cardioversion because of possible interruption of anticoagulation.   Saw Dr. Laneta Simmers 06/15/16 and was draining his Pleurx catheters every third day. He was having decreased drainage so Dr. Laneta Simmers told him to stop drainng them for 2 weeks and see how he does symptomatically and follow-up chest x-ray.  After 7 days he became short of breath and drained 700 cc off the right and 500 cc off the left. Waiting to hear from Dr. Laneta Simmers today.  Patient comes in today accompanied by his sister Craig [name]. He is quite frustrated with the drainage tubes and wants to know where all the fluids, and from. Over the weekend when he was so short of breath his ankles also began to swell. Once he drained it he felt much better. He is wondering if the cardioversion would help. He denies any symptoms from the atrial fibrillation. Has cut back on salty foods he was  eating.      Past Medical History:  Diagnosis Date  . Alcohol abuse    Prior  . CAD (coronary artery disease) of bypass graft     4v CABG (LIMA to LAD, SVG to OM, seq SVG to PDA/PLA) by Dr. Laneta Simmers 01/07/2016  . COPD (chronic obstructive pulmonary disease) (HCC)    severe on PFT prior to CABG  . dilated ascending aorta    s/p supra-coronary ascending aortic and hemi-arch replacement using a 30 mm Hemashield under deep hypothermic circulatory arrest during CABG 01/07/2016  . Hyperlipidemia   . Hypertension   .  Persistent atrial fibrillation (HCC)    in the setting of NSTEMI and persisted after [facility] 01/2016  . Recurrent left pleural effusion    s/p PleurX drain, occured after CABG  . Recurrent right pleural effusion    s/p PleurX drain, occured after CABG  . Severe aortic stenosis    s/p aortic valve replacement using 25 mm Edwards Magna-ease pericardial valve by Dr. Laneta Simmers during CABG 01/07/2016  . Tobacco abuse    Prior    Past Surgical History:  Procedure Laterality Date  . CARDIAC CATHETERIZATION N/A 12/29/2015   Procedure: Right/Left Heart Cath and Coronary Angiography;  Surgeon: Peter M [place], MD;  Location: [facility] INVASIVE CV LAB;  Service: Cardiovascular;  Laterality: N/A;  . CHEST TUBE INSERTION Bilateral 01/25/2016   Procedure: INSERTION Bilateral PLEURAL DRAINAGE CATHETER;  Surgeon: Alleen Borne, MD;  Location: [facility] OR;  Service: Thoracic;  Laterality: Bilateral;  . CHEST TUBE INSERTION Bilateral 05/11/2016   Procedure: INSERTION PLEURAL DRAINAGE CATHETER ;  Surgeon: Alleen Borne, MD;  Location: [facility] OR;  Service: Thoracic;  Laterality: Bilateral;  . CORONARY ARTERY BYPASS GRAFT N/A 01/07/2016   Procedure: CORONARY ARTERY BYPASS GRAFTING (CABG), ON PUMP, TIMES FOUR, USING LEFT INTERNAL MAMMARY ARTERY, BILATERAL GREATER SAPHENOUS VEINS HARVESTED ENDOSCOPICALLY;  Surgeon: Alleen Borne, MD;  Location: [facility] OR;  Service: Open Heart Surgery;  Laterality: N/A;  LIMA to LAD, SVG to OM, SVG SEQUENTIALLY to PDA and PLB  . PLEURADESIS Bilateral 05/11/2016   Procedure: PLEURADESIS;  Surgeon: Alleen Borne, MD;  Location: [facility] OR;  Service: Thoracic;  Laterality: Bilateral;  . REMOVAL OF PLEURAL DRAINAGE CATHETER Left 03/17/2016   Procedure: REMOVAL OF PLEURAL DRAINAGE CATHETER;  Surgeon: Alleen Borne, MD;  Location: [facility] OR;  Service: Thoracic;  Laterality: Left;  . ruptured disc repair  2009 or 2010  . TEE WITHOUT CARDIOVERSION N/A 01/07/2016   Procedure: TRANSESOPHAGEAL ECHOCARDIOGRAM (TEE);  Surgeon: Alleen Borne, MD;  Location: [facility] OR;  Service: Open Heart Surgery;  Laterality: N/A;  . THORACIC AORTIC ANEURYSM REPAIR N/A 01/07/2016   Procedure: CIRC ARREST AND REPLACEMENT OF ASCENDING THORACIC  ANEURYSM;  Surgeon: Alleen Borne, MD;  Location: [facility] OR;  Service: Open Heart Surgery;  Laterality: N/A;    Current Medications: Outpatient Medications Prior to Visit  Medication Sig Dispense Refill  . apixaban (ELIQUIS) 5 MG TABS tablet Take 1 tablet (5 mg total) by mouth 2 (two) times daily. 60 tablet 5  . aspirin 81 MG EC tablet Take 1 tablet (81 mg total) by mouth daily. 30 tablet 1  . atorvastatin (LIPITOR) 80 MG tablet Take 1 tablet (80 mg total) by mouth daily. 90 tablet 3  . furosemide (LASIX) 40 MG tablet Take 1 tablet (40 mg total) by mouth daily. 90 tablet 3  . metoprolol tartrate (LOPRESSOR) 25 MG tablet Take 0.5 tablets (12.5 mg total) by mouth  2 (two) times daily. 30 tablet 12  . nitroGLYCERIN (NITROSTAT) 0.4 MG SL tablet Place 1 tablet (0.4 mg total) under the tongue every 5 (five) minutes x 3 doses as needed for chest pain. 25 tablet 1  . potassium chloride 20 MEQ TBCR Take 20 mEq by mouth daily. 30 tablet 11  . sodium chloride (OCEAN) 0.65 % SOLN nasal spray Place 1 spray into both nostrils as needed for congestion.    . traMADol (ULTRAM) 50 MG tablet Take 1 tablet (50 mg total) by mouth every 6 (six) hours as needed for moderate pain. 30 tablet 0  . budesonide-formoterol (SYMBICORT) 160-4.5 MCG/ACT inhaler Inhale 2 puffs into the lungs 2 (two) times daily as needed (For shortness of breath.).     Marland Kitchen. tiotropium (SPIRIVA) 18 MCG inhalation capsule Place 18 mcg into inhaler and inhale daily as needed (For shortness of breath.).      No facility-administered medications prior to visit.      Allergies:   Review of patient's allergies indicates no known allergies.   Social History   Social History  . Marital status: Single    Spouse name: N/A  . Number of children: 3  . Years of  education: GED   Occupational History  . Retired heavy Arboriculturistequipment operator    Social History Main Topics  . Smoking status: Former Games developermoker  . Smokeless tobacco: Never Used     Comment: quit 2010 - up to 3 ppd for 50 years  . Alcohol use No     Comment: quit 2004 - 12 pack per day for 30 years  . Drug use: No     Comment: quit 2015  . Sexual activity: Not Asked   Other Topics Concern  . None   Social History Narrative   Lives with son   Drinks one cup of coffee a day and one soda q2-3 days     Family History:  The patient's   family history includes Diabetes in his father; Kidney disease in his brother, father, and sister; Stroke in his father and mother.   ROS:   Please see the history of present illness.    Review of Systems  Constitution: Negative.  HENT: Negative.   Cardiovascular: Positive for dyspnea on exertion, irregular heartbeat and leg swelling.  Respiratory: Negative.   Endocrine: Negative.   Hematologic/Lymphatic: Negative.   Musculoskeletal: Negative.   Gastrointestinal: Negative.   Genitourinary: Negative.   Neurological: Negative.    All other systems reviewed and are negative.   PHYSICAL EXAM:   VS:  BP 100/70   Pulse 74   Ht 5\' 9"  (1.753 m)   Wt 119 lb 6.4 oz (54.2 kg)   BMI 17.63 kg/m   Physical Exam  GEN: Well nourished, well developed, in no acute distress   Neck: Slight increase JVD, no carotid bruits, or masses Cardiac: Irregular irregular, no murmurs, rubs, or gallops  Respiratory:  Decreased breath sounds but clear clear to auscultation bilaterally, normal work of breathing GI: soft, nontender, nondistended, + BS Ext: without cyanosis, clubbing, or edema, Good distal pulses bilaterally MS: no deformity or atrophy  Skin: warm and dry, no rash Psych: euthymic mood, full affect  Wt Readings from Last 3 Encounters:  06/20/16 119 lb 6.4 oz (54.2 kg)  06/15/16 116 lb (52.6 kg)  05/27/16 116 lb (52.6 kg)      Studies/Labs Reviewed:    EKG:  EKG is  ordered today.  The ekg ordered today demonstratesAtrial fibrillation at  74 bpm, PVC, nonspecific ST-T wave changes, no acute change  Recent Labs: 05/06/2016: ALT 30; Magnesium 1.8; TSH 2.799 05/23/2016: Brain Natriuretic Peptide 315.0; Hemoglobin 11.8; Platelets 280 06/02/2016: BUN 24; Creat 1.19; Potassium 4.0; Sodium 138   Lipid Panel    Component Value Date/Time   CHOL 121 05/07/2016 0246   TRIG 100 05/07/2016 0246   HDL 42 05/07/2016 0246   CHOLHDL 2.9 05/07/2016 0246   VLDL 20 05/07/2016 0246   LDLCALC 59 05/07/2016 0246    Additional studies/ records that were reviewed today include:   Transthoracic Echocardiography  Left ventricle: The cavity size was normal. Wall thickness was   increased in a pattern of mild LVH. Systolic function was normal.   The estimated ejection fraction was in the range of 55% to 60%.   Wall motion was normal; there were no regional wall motion   abnormalities. - Ventricular septum: The contour showed diastolic flattening and   systolic flattening. - Aortic valve: A bioprosthesis was present. There was trivial   regurgitation. - Mitral valve: There was mild regurgitation. - Left atrium: The atrium was mildly dilated. - Pericardium, extracardiac: A small pericardial effusion was   identified. There was a left pleural effusion.   Impressions:   - Normal LV systolic function; mild LVH; septal flattening   suggestive of pulmonary hypertension; miild LAE; s/p AVR with   normal gradient and trace AI; mild MR.      ASSESSMENT:    1. Coronary artery disease involving native coronary artery of native heart without angina pectoris   2. Chronic combined systolic and diastolic HF (heart failure) (HCC)   3. Persistent atrial fibrillation (HCC)      PLAN:  In order of problems listed above: CAD status post recent CABG in 01/2016. No symptoms of angina.  Chronic systolic/diastolic heart failure although LV function has improved.  Continues to have trouble with fluid buildup and having to drain through the Pleurx tubes. Patient is concerned this may be coming from the atrial fibrillation. He has been on Eliquis  since 05/12/16. Check labs today.  Persistent atrial fibrillation rate controlled. Patient has been on Eliqus uninterrupted since 05/12/16. There was discussion about possible cardioversion. Will discuss with Dr. Clifton JamesMcAlhany to see if he wants to proceed. F/U in 1 month..     Medication Adjustments/Labs and Tests Ordered: Current medicines are reviewed at length with the patient today.  Concerns regarding medicines are outlined above.  Medication changes, Labs and Tests ordered today are listed in the Patient Instructions below. Patient Instructions  Medication Instructions:  Your physician recommends that you continue on your current medications as directed. Please refer to the Current Medication list given to you today.  Labwork: YOU HAVE LAB WOR TODAY; FASTING LIPID AND LIVER PANEL ORDERED BY Craig FredricksonLORI GERHARDT, NP AND TODAY BMET BY Lavene Penagos, PAC  Testing/Procedures: NONE  Follow-Up: NEEDS FOLLOW UP WITH DR. Clifton JamesMCALHANY WITH IN 1 MONTH PER Sharrod Achille, PAC  Any Other Special Instructions Will Be Listed Below (If Applicable).     If you need a refill on your cardiac medications before your next appointment, please call your pharmacy.      Elson ClanSigned, Leasha Goldberger, PA-C  06/20/2016 10:23 AM    [facility] [address], [place], [place]  [address] Phone: [phone]) [phone]; Fax: [phone]

## 2016-06-28 ENCOUNTER — Other Ambulatory Visit: Payer: Self-pay | Admitting: Surgery

## 2016-06-28 DIAGNOSIS — Z951 Presence of aortocoronary bypass graft: Secondary | ICD-10-CM

## 2016-06-29 ENCOUNTER — Ambulatory Visit
Admission: RE | Admit: 2016-06-29 | Discharge: 2016-06-29 | Disposition: A | Discharge status: Home / Self Care | Payer: Medicaid Other | Source: Ambulatory Visit | Attending: Surgery | Admitting: Surgery

## 2016-06-29 ENCOUNTER — Encounter: Payer: Self-pay | Admitting: Surgery

## 2016-06-29 ENCOUNTER — Ambulatory Visit (INDEPENDENT_AMBULATORY_CARE_PROVIDER_SITE_OTHER): Payer: Medicaid Other | Admitting: Surgery

## 2016-06-29 VITALS — BP 121/83 | HR 60 | Resp 18 | Ht 69.0 in | Wt 119.0 lb

## 2016-06-29 DIAGNOSIS — Z951 Presence of aortocoronary bypass graft: Secondary | ICD-10-CM

## 2016-06-29 DIAGNOSIS — Z938 Other artificial opening status: Secondary | ICD-10-CM | POA: Diagnosis not present

## 2016-06-29 DIAGNOSIS — J90 Pleural effusion, not elsewhere classified: Secondary | ICD-10-CM

## 2016-06-29 DIAGNOSIS — J948 Other specified pleural conditions: Secondary | ICD-10-CM | POA: Diagnosis not present

## 2016-06-29 NOTE — Progress Notes (Signed)
HPI:  [name] returns today for follow up of his PleurX catheters. I saw him on 8/9 and the catheters were not draining that much so I decided to stop draining them for two weeks to see how he did. Unfortunately after 7 days he develops marked SOB and had to drain the catheters. The right drained 700 cc and the left 500 cc with marked improvement in his breathing. He reports that he also developed a lot of lower leg and ankle edema about this time. He says that he never adds salt to his food but he eats most meals out at restaurants like [employer] and I am sure there is a lot of salt that he does not know about. He has been draining the catheters every 3 days now and there has been 300-500 cc from each side.   Current Outpatient Prescriptions  Medication Sig Dispense Refill  . apixaban (ELIQUIS) 5 MG TABS tablet Take 1 tablet (5 mg total) by mouth 2 (two) times daily. 60 tablet 5  . aspirin 81 MG EC tablet Take 1 tablet (81 mg total) by mouth daily. 30 tablet 1  . atorvastatin (LIPITOR) 80 MG tablet Take 1 tablet (80 mg total) by mouth daily. 90 tablet 3  . budesonide-formoterol (SYMBICORT) 160-4.5 MCG/ACT inhaler Inhale 2 puffs into the lungs 2 (two) times daily.    . furosemide (LASIX) 40 MG tablet Take 1 tablet (40 mg total) by mouth daily. 90 tablet 3  . metoprolol tartrate (LOPRESSOR) 25 MG tablet Take 0.5 tablets (12.5 mg total) by mouth 2 (two) times daily. 30 tablet 12  . nitroGLYCERIN (NITROSTAT) 0.4 MG SL tablet Place 1 tablet (0.4 mg total) under the tongue every 5 (five) minutes x 3 doses as needed for chest pain. 25 tablet 1  . potassium chloride 20 MEQ TBCR Take 20 mEq by mouth daily. 30 tablet 11  . sodium chloride (OCEAN) 0.65 % SOLN nasal spray Place 1 spray into both nostrils as needed for congestion.    Marland Kitchen. tiotropium (SPIRIVA) 18 MCG inhalation capsule Place 18 mcg into inhaler and inhale daily.    . traMADol (ULTRAM) 50 MG tablet Take 1 tablet (50 mg total) by mouth every 6  (six) hours as needed for moderate pain. 30 tablet 0   No current facility-administered medications for this visit.      Physical Exam: BP 121/83 (BP Location: Right Arm, Patient Position: Sitting, Cuff Size: Small)   Pulse 60   Resp 18   Ht 5\' 9"  (1.753 m)   Wt 119 lb (54 kg)   SpO2 95% Comment: RA  BMI 17.57 kg/m  He looks well Lung exam shows slight decrease in breath sounds in the bases Heart: IRRR. Ext: mild edema in ankles and feet.  Diagnostic Tests:  CLINICAL DATA:  Post CABG.  Bilateral pleural drainage catheters.  EXAM: CHEST  2 VIEW  COMPARISON:  06/15/2016  FINDINGS: Bilateral basal or pleural drainage catheters again noted, unchanged. No pneumothorax. No visible significant effusions. There is hyperinflation of the lungs compatible with COPD. Prior CABG heart is normal size. No confluent opacities or acute bony abnormality.  IMPRESSION: COPD.  Bilateral pleural drainage catheters without pneumothorax or significant effusion.   Electronically Signed   By: Charlett NoseKevin  Dover M.D.   On: 06/29/2016 16:08   Impression:  He still has significant drainage from both catheters, more on the right than the left. This is most likely related to heart failure. He had Doxycycline  pleurodesis when the catheters were inserted but it obviously did not work. Since [place] the [place] I think it is worthwhile trying Doxycycline again. I discussed this with the patient and his daughter and they agree. I don't think we will be successful in stopping the effusions unless his heart failure is under good control. He has been on Eliquis so that he can have a DCCV but this has not been scheduled. This may improve the management of his heart failure and I don't see any reason to delay that. His daughter is going to call the cardiology office for a follow up with Dr. Clifton JamesMcAlhany.  Plan:  We will schedule Doxycycline pleurodesis in short stay. He will need 500 mg of  Doxycycline in 50 cc saline on each side and would then cap the catheters and begin daily drainage the next day. I will then see him back a week later in the office with a CXR.    Alleen BorneBryan K Bartle, MD [facility] [phone]

## 2016-06-30 ENCOUNTER — Other Ambulatory Visit: Payer: Self-pay | Admitting: *Deleted

## 2016-06-30 DIAGNOSIS — J90 Pleural effusion, not elsewhere classified: Secondary | ICD-10-CM

## 2016-07-05 MED ORDER — SODIUM CHLORIDE 0.9 % IV SOLN
500.0000 mg | Freq: Once | INTRAVENOUS | Status: DC
  Filled 2016-07-05: qty 500

## 2016-07-05 MED ORDER — SODIUM CHLORIDE 0.9 % IV SOLN
500.0000 mg | INTRAVENOUS | Status: DC
  Filled 2016-07-05: qty 500

## 2016-07-06 ENCOUNTER — Ambulatory Visit (HOSPITAL_COMMUNITY)
Admission: RE | Admit: 2016-07-06 | Discharge: 2016-07-06 | Disposition: A | Discharge status: Home / Self Care | Payer: Medicaid Other | Source: Ambulatory Visit | Attending: Surgery | Admitting: Surgery

## 2016-07-06 ENCOUNTER — Encounter (HOSPITAL_COMMUNITY)
Admission: RE | Disposition: A | Discharge status: Home / Self Care | Payer: Self-pay | Source: Ambulatory Visit | Attending: Surgery

## 2016-07-06 DIAGNOSIS — J90 Pleural effusion, not elsewhere classified: Secondary | ICD-10-CM | POA: Insufficient documentation

## 2016-07-06 HISTORY — PX: TALC PLEURODESIS: SHX2506

## 2016-07-06 SURGERY — PLEURODESIS, USING TALC
Anesthesia: Monitor Anesthesia Care | Laterality: Bilateral

## 2016-07-06 MED ORDER — OXYCODONE-ACETAMINOPHEN 5-325 MG PO TABS: 1.0000 | ORAL_TABLET | Freq: Once | ORAL | Status: DC

## 2016-07-06 MED ORDER — OXYCODONE-ACETAMINOPHEN 5-325 MG PO TABS
1.0000 | ORAL_TABLET | Freq: Once | ORAL | Status: AC
  Administered 2016-07-06: 1 via ORAL
  Administered 2016-07-06: 0.5 via ORAL

## 2016-07-06 MED ORDER — OXYCODONE-ACETAMINOPHEN 5-325 MG PO TABS
ORAL_TABLET | ORAL | Status: AC
  Filled 2016-07-06: qty 1

## 2016-07-06 MED ORDER — OXYCODONE-ACETAMINOPHEN 5-325 MG PO TABS
ORAL_TABLET | ORAL | Status: AC
  Administered 2016-07-06: 0.5 via ORAL
  Filled 2016-07-06: qty 1

## 2016-07-06 NOTE — Progress Notes (Addendum)
Patient continues to have burning pain.  7/10.  He did get some relief with the PO pain med which was given @ 11:55 am.   1310: Still complaining of severe pain.  Additional pain medication given ( 1/2 tab of oxycodone to start) per Doree Fudgeonielle Zimmerman, PA's order.

## 2016-07-06 NOTE — Procedures (Addendum)
Patient presents today for bilateral Doxycycline pleurodesis via Pleur X catheters. Patient first had bilateral drainage of Pleur X catheters. 250 cc of amber fluid was drained on the right and 200 cc of amber fluid was drained from the left. Doxycycline was then placed into each of the Pleur X catheters (after cap was removed and betadine was used on the tip of the cathter). Patient had a fair amount of pain after each pleurodesis. I gave him a Percocet 5/325 mg once. Vital signs were monitored afterward. Sterile dressing was placed on each Pleur X. Patient was informed he may continue to have pain for the next 24 hours as well as a fever. He may take Tylenol for the fever. He also requested a prescription for Percocet for pain. I gave him this ( Percocet 5 mg) and he was instructed to take one by mouth every 4 hours PRN severe pain. He already has a prescription for Ultram for moderate pain. A follow up appointment has been arranged with Dr. Laneta SimmersBartle.   Indications and procedure performed as above. PA was directly supervised by me. Patient tolerated the procedure well and was discharged home.

## 2016-07-06 NOTE — Progress Notes (Signed)
Patient states he feels like the extra 1/2 of pain medication is helping and would like to go home. Discharged home via car with sister

## 2016-07-07 ENCOUNTER — Encounter (HOSPITAL_COMMUNITY): Payer: Self-pay | Admitting: Surgery

## 2016-07-13 ENCOUNTER — Encounter (HOSPITAL_COMMUNITY): Payer: Self-pay | Admitting: Emergency Medicine

## 2016-07-13 ENCOUNTER — Emergency Department (HOSPITAL_COMMUNITY)
Admission: EM | Admit: 2016-07-13 | Discharge: 2016-07-13 | Disposition: A | Discharge status: Home / Self Care | Payer: Medicaid Other | Attending: Physician Assistant | Admitting: Physician Assistant

## 2016-07-13 ENCOUNTER — Telehealth: Payer: Self-pay | Admitting: Cardiovascular Disease

## 2016-07-13 DIAGNOSIS — I10 Essential (primary) hypertension: Secondary | ICD-10-CM | POA: Diagnosis not present

## 2016-07-13 DIAGNOSIS — Z951 Presence of aortocoronary bypass graft: Secondary | ICD-10-CM | POA: Insufficient documentation

## 2016-07-13 DIAGNOSIS — J449 Chronic obstructive pulmonary disease, unspecified: Secondary | ICD-10-CM | POA: Diagnosis not present

## 2016-07-13 DIAGNOSIS — R040 Epistaxis: Secondary | ICD-10-CM | POA: Diagnosis present

## 2016-07-13 DIAGNOSIS — Z7982 Long term (current) use of aspirin: Secondary | ICD-10-CM | POA: Diagnosis not present

## 2016-07-13 DIAGNOSIS — I2510 Atherosclerotic heart disease of native coronary artery without angina pectoris: Secondary | ICD-10-CM | POA: Insufficient documentation

## 2016-07-13 DIAGNOSIS — Z87891 Personal history of nicotine dependence: Secondary | ICD-10-CM | POA: Insufficient documentation

## 2016-07-13 DIAGNOSIS — I252 Old myocardial infarction: Secondary | ICD-10-CM | POA: Insufficient documentation

## 2016-07-13 NOTE — Telephone Encounter (Signed)
New Message:    Pt nose been bleeding for over an hour and 30 minutes,can not get it to stop bleeding.

## 2016-07-13 NOTE — ED Provider Notes (Signed)
[facility]-EMERGENCY DEPT Provider Note   CSN: [id number] Arrival date & time: 07/13/16  1332    By signing my name below, I, Sonum Patel, attest that this documentation has been prepared under the direction and in the presence of Teressa Lower, NP. Electronically Signed: Sonum Patel, Neurosurgeon. 07/13/16. 1:50 PM.  History   Chief Complaint Chief Complaint  Patient presents with  . Epistaxis    The history is provided by the patient. No language interpreter was used.     HPI Comments: Craig [name] is a 65 y.o. male who presents to the Emergency Department complaining of a resolved  nosebleed that occurred over the last couple of hours. Patient was clipping his nose hairs with scissors when he accidentally cut himself. He reports taking Eliquis and states the bleeding continued for an hour. He has applied pressure with a paper napkin to reduce the bleeding. There is no active bleeding at this time. He denies any other complaints at this time.   Past Medical History:  Diagnosis Date  . Alcohol abuse    Prior  . CAD (coronary artery disease) of bypass graft     4v CABG (LIMA to LAD, SVG to OM, seq SVG to PDA/PLA) by Dr. Laneta Simmers 01/07/2016  . COPD (chronic obstructive pulmonary disease) (HCC)    severe on PFT prior to CABG  . dilated ascending aorta    s/p supra-coronary ascending aortic and hemi-arch replacement using a 30 mm Hemashield under deep hypothermic circulatory arrest during CABG 01/07/2016  . Hyperlipidemia   . Hypertension   . Persistent atrial fibrillation (HCC)    in the setting of NSTEMI and persisted after [facility] 01/2016  . Recurrent left pleural effusion    s/p PleurX drain, occured after CABG  . Recurrent right pleural effusion    s/p PleurX drain, occured after CABG  . Severe aortic stenosis    s/p aortic valve replacement using 25 mm Edwards Magna-ease pericardial valve by Dr. Laneta Simmers during CABG 01/07/2016  . Tobacco abuse    Prior    Patient Active Problem List   Diagnosis Date Noted  . Dyspnea 05/06/2016  . Encounter for therapeutic drug monitoring 02/15/2016  . Supplemental oxygen dependent   . AKI (acute kidney injury) (HCC)   . Peripheral edema   . Hyperkalemia   . Hypoalbuminemia due to protein-calorie malnutrition (HCC)   . Debility 02/03/2016  . History of sepsis   . History of non-ST elevation myocardial infarction (NSTEMI)   . Coronary artery disease involving coronary bypass graft of native heart without angina pectoris   . History of aortic aneurysm repair   . Chronic obstructive pulmonary disease (HCC)   . Leukocytosis   . Hyponatremia   . Thrombocytopenia (HCC)   . Acute blood loss anemia   . Sepsis (HCC)   . S/P CABG (coronary artery bypass graft)   . Paroxysmal atrial fibrillation (HCC)   . S/P CABG x 4   . Pleural effusion, bilateral   . Acute respiratory acidosis   . Acute renal failure (ARF) (HCC)   . Severe aortic stenosis   . Coronary artery disease involving native coronary artery of native heart without angina pectoris   . Coronary artery disease involving native coronary artery of native heart with unstable angina pectoris (HCC)   . Acute respiratory failure (HCC)   . CAD (coronary artery disease)   . Aortic stenosis   . Acute respiratory failure with hypoxia (HCC) 12/27/2015  . Elevated troponin   .  History of hypertension   . History of hyperlipidemia   . Ascending aortic aneurysm (HCC)   . CAP (community acquired pneumonia) 12/26/2015  . NSTEMI (non-ST elevated myocardial infarction) (HCC) 12/26/2015  . COPD (chronic obstructive pulmonary disease) (HCC) 12/26/2015  . Hypoxia 12/26/2015  . Ruptured lumbar disc 02/17/2015  . Back pain 02/17/2015    Past Surgical History:  Procedure Laterality Date  . CARDIAC CATHETERIZATION N/A 12/29/2015   Procedure: Right/Left Heart Cath and Coronary Angiography;  Surgeon: Peter M SwazilandJordan, MD;  [facility];  Service: Cardiovascular;  Laterality: N/A;  .  CHEST TUBE INSERTION Bilateral 01/25/2016   Procedure: INSERTION Bilateral PLEURAL DRAINAGE CATHETER;  Surgeon: Alleen BorneBryan K Bartle, MD;  Location: [facility] OR;  Service: Thoracic;  Laterality: Bilateral;  . CHEST TUBE INSERTION Bilateral 05/11/2016   Procedure: INSERTION PLEURAL DRAINAGE CATHETER ;  Surgeon: Alleen BorneBryan K Bartle, MD;  Location: [facility] OR;  Service: Thoracic;  Laterality: Bilateral;  . CORONARY ARTERY BYPASS GRAFT N/A 01/07/2016   Procedure: CORONARY ARTERY BYPASS GRAFTING (CABG), ON PUMP, TIMES FOUR, USING LEFT INTERNAL MAMMARY ARTERY, BILATERAL GREATER SAPHENOUS VEINS HARVESTED ENDOSCOPICALLY;  Surgeon: Alleen BorneBryan K Bartle, MD;  Location: [facility] OR;  Service: Open Heart Surgery;  Laterality: N/A;  LIMA to LAD, SVG to OM, SVG SEQUENTIALLY to PDA and PLB  . PLEURADESIS Bilateral 05/11/2016   Procedure: PLEURADESIS;  Surgeon: Alleen BorneBryan K Bartle, MD;  [facility];  Service: Thoracic;  Laterality: Bilateral;  . REMOVAL OF PLEURAL DRAINAGE CATHETER Left 03/17/2016   Procedure: REMOVAL OF PLEURAL DRAINAGE CATHETER;  Surgeon: Alleen BorneBryan K Bartle, MD;  Location: [facility] OR;  Service: Thoracic;  Laterality: Left;  . ruptured disc repair  2009 or 2010  . TALC PLEURODESIS Bilateral 07/06/2016   Procedure: BILATERAL DOXYCYCLINE PLEURADESIS;  Surgeon: Alleen BorneBryan K Bartle, MD;  Location: [facility] OR;  Service: Thoracic;  Laterality: Bilateral;  . TEE WITHOUT CARDIOVERSION N/A 01/07/2016   Procedure: TRANSESOPHAGEAL ECHOCARDIOGRAM (TEE);  Surgeon: Alleen BorneBryan K Bartle, MD;  [facility];  Service: Open Heart Surgery;  Laterality: N/A;  . THORACIC AORTIC ANEURYSM REPAIR N/A 01/07/2016   Procedure: CIRC ARREST AND REPLACEMENT OF ASCENDING THORACIC  ANEURYSM;  Surgeon: Alleen BorneBryan K Bartle, MD;  Location: [facility] OR;  Service: Open Heart Surgery;  Laterality: N/A;       Home Medications    Prior to Admission medications   Medication Sig Start Date End Date Taking? Authorizing Provider  apixaban (ELIQUIS) 5 MG TABS tablet Take 1 tablet (5 mg total) by mouth 2 (two)  times daily. 05/06/16   Kathleene Hazelhristopher D McAlhany, MD  aspirin 81 MG EC tablet Take 1 tablet (81 mg total) by mouth daily. 02/12/16   Jacquelynn CreePamela S Love, PA-C  atorvastatin (LIPITOR) 80 MG tablet Take 1 tablet (80 mg total) by mouth daily. 05/23/16   Rosalio MacadamiaLori C Gerhardt, NP  budesonide-formoterol (SYMBICORT) 160-4.5 MCG/ACT inhaler Inhale 2 puffs into the lungs 2 (two) times daily.    Historical Provider, MD  furosemide (LASIX) 40 MG tablet Take 1 tablet (40 mg total) by mouth daily. 05/23/16   Rosalio MacadamiaLori C Gerhardt, NP  metoprolol tartrate (LOPRESSOR) 25 MG tablet Take 0.5 tablets (12.5 mg total) by mouth 2 (two) times daily. 05/12/16   Little IshikawaErin E Smith, NP  nitroGLYCERIN (NITROSTAT) 0.4 MG SL tablet Place 1 tablet (0.4 mg total) under the tongue every 5 (five) minutes x 3 doses as needed for chest pain. 05/12/16   Little IshikawaErin E Smith, NP  potassium chloride 20 MEQ TBCR Take 20 mEq  by mouth daily. 05/26/16   Rosalio Macadamia, NP  sodium chloride (OCEAN) 0.65 % SOLN nasal spray Place 1 spray into both nostrils as needed for congestion.    Historical Provider, MD  tiotropium (SPIRIVA) 18 MCG inhalation capsule Place 18 mcg into inhaler and inhale daily.    Historical Provider, MD  traMADol (ULTRAM) 50 MG tablet Take 1 tablet (50 mg total) by mouth every 6 (six) hours as needed for moderate pain. 05/12/16   Little Ishikawa, NP    Family History Family History  Problem Relation Age of Onset  . Stroke Mother   . Diabetes Father   . Stroke Father   . Kidney disease Father   . Kidney disease Sister   . Kidney disease Brother   . Heart attack Neg Hx     Social History Social History  Substance Use Topics  . Smoking status: Former Games developer  . Smokeless tobacco: Never Used     Comment: quit 2010 - up to 3 ppd for 50 years  . Alcohol use No     Comment: quit 2004 - 12 pack per day for 30 years     Allergies   No known allergies   Review of Systems Review of Systems  Constitutional: Negative for fever.  HENT: Positive for  nosebleeds.   All other systems reviewed and are negative.    Physical Exam Updated Vital Signs BP 130/91 (BP Location: Right Arm)   Pulse 69   Temp 97.9 F (36.6 C) (Oral)   Resp 18   Ht 5\' 9"  (1.753 m)   Wt 129 lb (58.5 kg)   SpO2 96%   BMI 19.05 kg/m   Physical Exam  Constitutional: He is oriented to person, place, and time. He appears well-developed and well-nourished. No distress.  HENT:  Head: Normocephalic and atraumatic.  Nose: No epistaxis.  Eyes: Conjunctivae and EOM are normal.  Neck: Neck supple. No tracheal deviation present.  Cardiovascular: Normal rate.   Pulmonary/Chest: Effort normal. No respiratory distress.  Musculoskeletal: Normal range of motion.  Neurological: He is alert and oriented to person, place, and time.  Skin: Skin is warm and dry.  Psychiatric: He has a normal mood and affect. His behavior is normal.  Nursing note and vitals reviewed.    ED Treatments / Results  DIAGNOSTIC STUDIES: Oxygen Saturation is 96% on RA, adequate by my interpretation.    COORDINATION OF CARE: 1:50 PM Discussed treatment plan with pt at bedside and pt agreed to plan.    Labs (all labs ordered are listed, but only abnormal results are displayed) Labs Reviewed - No data to display  EKG  EKG Interpretation None       Radiology No results found.  Procedures Procedures (including critical care time)  Medications Ordered in ED Medications - No data to display   Initial Impression / Assessment and Plan / ED Course  I have reviewed the triage vital signs and the nursing notes.  Pertinent labs & imaging results that were available during my care of the patient were reviewed by me and considered in my medical decision making (see chart for details).  Clinical Course    Pt has no bleeding at this time  Final Clinical Impressions(s) / ED Diagnoses   Final diagnoses:  None    New Prescriptions New Prescriptions   No medications on file   I  personally performed the services described in this documentation, which was scribed in my presence. The recorded information  has been reviewed and is accurate.    Teressa Lower, NP 07/13/16 1502    Courteney Randall An, MD 07/15/16 1103

## 2016-07-13 NOTE — ED Triage Notes (Signed)
Pt sts nose bleeding after accidentally cut with scissors; pt sts taking eliquis

## 2016-07-13 NOTE — Telephone Encounter (Signed)
Agree. Thanks

## 2016-07-13 NOTE — Telephone Encounter (Signed)
Spoke with pt's sister. She reports pt called her and told her his nose was bleeding. Sister reports pt has had to have cauterization done in past due to nasal bleeding.  She has spoken with primary care who recommended ED.  Sister states pt told her bleeding will slow when he holds pressure but  when he releases pressure bleeding starts again.  Sister is on her way to pt's house and is almost there.  I told her pt needed to go to ED.

## 2016-07-19 ENCOUNTER — Other Ambulatory Visit: Payer: Self-pay | Admitting: Surgery

## 2016-07-19 DIAGNOSIS — I25119 Atherosclerotic heart disease of native coronary artery with unspecified angina pectoris: Secondary | ICD-10-CM

## 2016-07-20 ENCOUNTER — Encounter: Payer: Self-pay | Admitting: Surgery

## 2016-07-20 ENCOUNTER — Ambulatory Visit (INDEPENDENT_AMBULATORY_CARE_PROVIDER_SITE_OTHER): Payer: Medicaid Other | Admitting: Surgery

## 2016-07-20 ENCOUNTER — Ambulatory Visit
Admission: RE | Admit: 2016-07-20 | Discharge: 2016-07-20 | Disposition: A | Discharge status: Home / Self Care | Payer: Medicaid Other | Source: Ambulatory Visit | Attending: Surgery | Admitting: Surgery

## 2016-07-20 VITALS — BP 127/89 | HR 80 | Resp 18 | Ht 69.0 in | Wt 129.0 lb

## 2016-07-20 DIAGNOSIS — I2510 Atherosclerotic heart disease of native coronary artery without angina pectoris: Secondary | ICD-10-CM

## 2016-07-20 DIAGNOSIS — I25119 Atherosclerotic heart disease of native coronary artery with unspecified angina pectoris: Secondary | ICD-10-CM

## 2016-07-20 DIAGNOSIS — Z8679 Personal history of other diseases of the circulatory system: Secondary | ICD-10-CM

## 2016-07-20 DIAGNOSIS — Z938 Other artificial opening status: Secondary | ICD-10-CM

## 2016-07-20 DIAGNOSIS — I350 Nonrheumatic aortic (valve) stenosis: Secondary | ICD-10-CM

## 2016-07-20 DIAGNOSIS — I712 Thoracic aortic aneurysm, without rupture, unspecified: Secondary | ICD-10-CM

## 2016-07-20 DIAGNOSIS — Z952 Presence of prosthetic heart valve: Secondary | ICD-10-CM

## 2016-07-20 DIAGNOSIS — Z954 Presence of other heart-valve replacement: Secondary | ICD-10-CM

## 2016-07-20 DIAGNOSIS — J90 Pleural effusion, not elsewhere classified: Secondary | ICD-10-CM

## 2016-07-20 DIAGNOSIS — Z9889 Other specified postprocedural states: Secondary | ICD-10-CM

## 2016-07-21 ENCOUNTER — Encounter: Payer: Self-pay | Admitting: Surgery

## 2016-07-21 NOTE — Progress Notes (Signed)
HPI:  The patient returns today for follow up of his bilateral pleurX catheters that were placed for refractory, symptomatic pleural effusion though to be due to CHF. We inserted doxycycline for pleurodesis for the second time about two weeks ago. The tubes continue to drain. For the first couple days it seemed like the drainage was decreasing but then increased and is now about 300 cc per day on the right and 200 cc per day on the left. He feels well and has no significant shortness of breath, no edema in legs. He is getting tired of having the catheters in.  Current Outpatient Prescriptions  Medication Sig Dispense Refill  . apixaban (ELIQUIS) 5 MG TABS tablet Take 1 tablet (5 mg total) by mouth 2 (two) times daily. 60 tablet 5  . aspirin 81 MG EC tablet Take 1 tablet (81 mg total) by mouth daily. 30 tablet 1  . atorvastatin (LIPITOR) 80 MG tablet Take 1 tablet (80 mg total) by mouth daily. 90 tablet 3  . budesonide-formoterol (SYMBICORT) 160-4.5 MCG/ACT inhaler Inhale 2 puffs into the lungs 2 (two) times daily.    . furosemide (LASIX) 40 MG tablet Take 1 tablet (40 mg total) by mouth daily. 90 tablet 3  . metoprolol tartrate (LOPRESSOR) 25 MG tablet Take 0.5 tablets (12.5 mg total) by mouth 2 (two) times daily. 30 tablet 12  . nitroGLYCERIN (NITROSTAT) 0.4 MG SL tablet Place 1 tablet (0.4 mg total) under the tongue every 5 (five) minutes x 3 doses as needed for chest pain. 25 tablet 1  . potassium chloride 20 MEQ TBCR Take 20 mEq by mouth daily. 30 tablet 11  . sodium chloride (OCEAN) 0.65 % SOLN nasal spray Place 1 spray into both nostrils as needed for congestion.    Marland Kitchen tiotropium (SPIRIVA) 18 MCG inhalation capsule Place 18 mcg into inhaler and inhale daily.    . traMADol (ULTRAM) 50 MG tablet Take 1 tablet (50 mg total) by mouth every 6 (six) hours as needed for moderate pain. 30 tablet 0   No current facility-administered medications for this visit.      Physical Exam: BP 127/89  (BP Location: Right Arm, Patient Position: Sitting, Cuff Size: Small)   Pulse 80   Resp 18   Ht 5\' 9"  (1.753 m)   Wt 129 lb (58.5 kg)   SpO2 90% Comment: RA  BMI 19.05 kg/m  He looks well Lung exam shows slight decrease in breath sounds in the bases Heart: IRRR. Ext: no edema in ankles and feet.   Diagnostic Tests:  CLINICAL DATA:  Pleural effusion  EXAM: CHEST  2 VIEW  COMPARISON:  06/29/2016  FINDINGS: Cardiomediastinal silhouette is stable. Hyperinflation again noted. Bilateral old rib fractures are again noted. Bilateral low pleural catheter is unchanged in position. Status post median sternotomy and cardiac valve replacement. Osteopenia and mild degenerative changes thoracic spine. No segmental infiltrate or pulmonary edema. Mild basilar atelectasis.  IMPRESSION: Bilateral pleural catheter is unchanged in position. Small bilateral pleural effusion bilateral basilar atelectasis. No segmental infiltrate or pulmonary edema. Hyperinflation.   Electronically Signed   By: Natasha Mead M.D.   On: 07/20/2016 10:53   Impression:  He has persistent drainage from the pleurX catheters although he has had doxycycline pleurodesis twice. The amount of drainage is not large but I think it is still significant. He has bilateral catheters before and I removed them when the drainage was at this level and the effusions quickly recurred. His lung  function is poor and it doesn't take more than 500 cc to cause him significant shortness of breath. I don't think putting more doxycycline in is going to have an effect and talc is not available. A surgical pleurectomy would probably resolve the problem but it is a significant procedure and I think he would tolerate it poorly with his lung disease and general condition. He is also on Eliquis for CAF. I discussed the case with my colleagues and I think it is probably best to leave the catheters in for now since he is doing well and the  effusions are completely drained with no loculations. There is some risk of infection but if the catheters get infected we will have to deal with that at the time as needed. Hopefully the drainage will eventually cease.  Plan:  I will plan to see him back in three weeks with a CXR.   Alleen BorneBryan K Shoshana Johal, MD [facility] [phone]

## 2016-07-25 ENCOUNTER — Encounter: Payer: Self-pay | Admitting: Cardiovascular Disease

## 2016-07-25 ENCOUNTER — Ambulatory Visit (INDEPENDENT_AMBULATORY_CARE_PROVIDER_SITE_OTHER): Payer: Medicaid Other | Admitting: Cardiovascular Disease

## 2016-07-25 VITALS — BP 124/70 | HR 77 | Ht 69.0 in | Wt 120.0 lb

## 2016-07-25 DIAGNOSIS — I5042 Chronic combined systolic (congestive) and diastolic (congestive) heart failure: Secondary | ICD-10-CM

## 2016-07-25 DIAGNOSIS — I481 Persistent atrial fibrillation: Secondary | ICD-10-CM | POA: Diagnosis not present

## 2016-07-25 DIAGNOSIS — I350 Nonrheumatic aortic (valve) stenosis: Secondary | ICD-10-CM

## 2016-07-25 DIAGNOSIS — I2510 Atherosclerotic heart disease of native coronary artery without angina pectoris: Secondary | ICD-10-CM | POA: Diagnosis not present

## 2016-07-25 DIAGNOSIS — J90 Pleural effusion, not elsewhere classified: Secondary | ICD-10-CM

## 2016-07-25 DIAGNOSIS — I4819 Other persistent atrial fibrillation: Secondary | ICD-10-CM

## 2016-07-25 MED ORDER — FUROSEMIDE 40 MG PO TABS: 40.0000 mg | ORAL_TABLET | Freq: Two times a day (BID) | ORAL | 1 refills | Status: DC

## 2016-07-25 NOTE — Progress Notes (Signed)
Chief Complaint  Patient presents with  . Shortness of Breath     History of Present Illness: 65 yo male with history of CAD s/p CABG, severe aortic stenosis s/p AVR, thoracic aortic aneurysm s/p root replacement, COPD, HTN, HLD, tobacco abuse and atrial fibrillation here today for cardiac follow up. He underwent 4 V CABG, AVR with pericardial valve and aortic root replacement 01/07/16. Post-operative atrial fibrillation and pleural effusions. I saw him 05/06/16 and he was dyspneic in the office with O2 sats 77%. He was admitted and diuresed 5 liters with no improvement in dyspnea. He also has COPD but continued dyspnea not improved with diuresis and felt to be due to pleural effusions. He has been followed closely by Dr. Laneta Simmers and has undergone chest tube placement with doxycycline for pleurodesis x 2. He was seen most recently in the CT surgery office by Dr. Laneta Simmers 07/20/16 and his PleurX catheters continue to drain.   He tells me today that he feels much better. He is still frustrated by the output from his chest tubes. His breathing is much better than it had been. No chest pain.   Primary Care Physician: Rose Fillers, PA-C   Past Medical History:  Diagnosis Date  . Alcohol abuse    Prior  . CAD (coronary artery disease) of bypass graft     4v CABG (LIMA to LAD, SVG to OM, seq SVG to PDA/PLA) by Dr. Laneta Simmers 01/07/2016  . COPD (chronic obstructive pulmonary disease) (HCC)    severe on PFT prior to CABG  . dilated ascending aorta    s/p supra-coronary ascending aortic and hemi-arch replacement using a 30 mm Hemashield under deep hypothermic circulatory arrest during CABG 01/07/2016  . Hyperlipidemia   . Hypertension   . Persistent atrial fibrillation (HCC)    in the setting of NSTEMI and persisted after [facility] 01/2016  . Recurrent left pleural effusion    s/p PleurX drain, occured after CABG  . Recurrent right pleural effusion    s/p PleurX drain, occured after CABG  . Severe aortic  stenosis    s/p aortic valve replacement using 25 mm Edwards Magna-ease pericardial valve by Dr. Laneta Simmers during CABG 01/07/2016  . Tobacco abuse    Prior    Past Surgical History:  Procedure Laterality Date  . CARDIAC CATHETERIZATION N/A 12/29/2015   Procedure: Right/Left Heart Cath and Coronary Angiography;  Surgeon: Peter M Swaziland, MD;  Location: [facility] INVASIVE CV LAB;  Service: Cardiovascular;  Laterality: N/A;  . CHEST TUBE INSERTION Bilateral 01/25/2016   Procedure: INSERTION Bilateral PLEURAL DRAINAGE CATHETER;  Surgeon: Alleen Borne, MD;  Location: [facility] OR;  Service: Thoracic;  Laterality: Bilateral;  . CHEST TUBE INSERTION Bilateral 05/11/2016   Procedure: INSERTION PLEURAL DRAINAGE CATHETER ;  Surgeon: Alleen Borne, MD;  Location: [facility] OR;  Service: Thoracic;  Laterality: Bilateral;  . CORONARY ARTERY BYPASS GRAFT N/A 01/07/2016   Procedure: CORONARY ARTERY BYPASS GRAFTING (CABG), ON PUMP, TIMES FOUR, USING LEFT INTERNAL MAMMARY ARTERY, BILATERAL GREATER SAPHENOUS VEINS HARVESTED ENDOSCOPICALLY;  Surgeon: Alleen Borne, MD;  Location: [facility] OR;  Service: Open Heart Surgery;  Laterality: N/A;  LIMA to LAD, SVG to OM, SVG SEQUENTIALLY to PDA and PLB  . PLEURADESIS Bilateral 05/11/2016   Procedure: PLEURADESIS;  Surgeon: Alleen Borne, MD;  Location: [facility] OR;  Service: Thoracic;  Laterality: Bilateral;  . REMOVAL OF PLEURAL DRAINAGE CATHETER Left 03/17/2016   Procedure: REMOVAL OF PLEURAL DRAINAGE CATHETER;  Surgeon: Alleen Borne, MD;  Location: [facility] OR;  Service: Thoracic;  Laterality: Left;  . ruptured disc repair  2009 or 2010  . TALC PLEURODESIS Bilateral 07/06/2016   Procedure: BILATERAL DOXYCYCLINE PLEURADESIS;  Surgeon: Alleen BorneBryan K Bartle, MD;  Location: [facility] OR;  Service: Thoracic;  Laterality: Bilateral;  . TEE WITHOUT CARDIOVERSION N/A 01/07/2016   Procedure: TRANSESOPHAGEAL ECHOCARDIOGRAM (TEE);  Surgeon: Alleen BorneBryan K Bartle, MD;  [facility];  Service: Open Heart Surgery;  Laterality: N/A;  . THORACIC  AORTIC ANEURYSM REPAIR N/A 01/07/2016   Procedure: CIRC ARREST AND REPLACEMENT OF ASCENDING THORACIC  ANEURYSM;  Surgeon: Alleen BorneBryan K Bartle, MD;  Location: [facility] OR;  Service: Open Heart Surgery;  Laterality: N/A;    Current Outpatient Prescriptions  Medication Sig Dispense Refill  . apixaban (ELIQUIS) 5 MG TABS tablet Take 1 tablet (5 mg total) by mouth 2 (two) times daily. 60 tablet 5  . aspirin 81 MG EC tablet Take 1 tablet (81 mg total) by mouth daily. 30 tablet 1  . atorvastatin (LIPITOR) 80 MG tablet Take 1 tablet (80 mg total) by mouth daily. 90 tablet 3  . budesonide-formoterol (SYMBICORT) 160-4.5 MCG/ACT inhaler Inhale 2 puffs into the lungs 2 (two) times daily.    . furosemide (LASIX) 40 MG tablet Take 1 tablet (40 mg total) by mouth 2 (two) times daily. 180 tablet 1  . metoprolol tartrate (LOPRESSOR) 25 MG tablet Take 0.5 tablets (12.5 mg total) by mouth 2 (two) times daily. 30 tablet 12  . nitroGLYCERIN (NITROSTAT) 0.4 MG SL tablet Place 1 tablet (0.4 mg total) under the tongue every 5 (five) minutes x 3 doses as needed for chest pain. 25 tablet 1  . potassium chloride 20 MEQ TBCR Take 20 mEq by mouth daily. 30 tablet 11  . sodium chloride (OCEAN) 0.65 % SOLN nasal spray Place 1 spray into both nostrils as needed for congestion.    Marland Kitchen. tiotropium (SPIRIVA) 18 MCG inhalation capsule Place 18 mcg into inhaler and inhale daily.    . traMADol (ULTRAM) 50 MG tablet Take 1 tablet (50 mg total) by mouth every 6 (six) hours as needed for moderate pain. 30 tablet 0   No current facility-administered medications for this visit.     Allergies  Allergen Reactions  . No Known Allergies     Social History   Social History  . Marital status: Single    Spouse name: N/A  . Number of children: 3  . Years of education: GED   Occupational History  . Retired heavy Arboriculturistequipment operator    Social History Main Topics  . Smoking status: Former Games developermoker  . Smokeless tobacco: Never Used     Comment: quit  2010 - up to 3 ppd for 50 years  . Alcohol use No     Comment: quit 2004 - 12 pack per day for 30 years  . Drug use: No     Comment: quit 2015  . Sexual activity: Not on file   Other Topics Concern  . Not on file   Social History Narrative   Lives with son   Drinks one cup of coffee a day and one soda q2-3 days    Family History  Problem Relation Age of Onset  . Stroke Mother   . Diabetes Father   . Stroke Father   . Kidney disease Father   . Kidney disease Sister   . Kidney disease Brother   . Heart attack Neg Hx     Review of Systems:  As stated  in the HPI and otherwise negative.   BP 124/70   Pulse 77   Ht 5\' 9"  (1.753 m)   Wt 120 lb (54.4 kg)   BMI 17.72 kg/m   Physical Examination: General: Well developed, thin male. NAD  HEENT: OP clear, mucus membranes moist  SKIN: warm, dry. No rashes. Neuro: No focal deficits  Musculoskeletal: Muscle strength 5/5 all ext  Psychiatric: Mood and affect normal  Neck: No JVD, no carotid bruits, no thyromegaly, no lymphadenopathy.  Lungs: Crackles bases bilaterally. No wheezes, rhonci, crackles Cardiovascular: Irreg irreg with no loud murmurs. No gallops or rubs. Abdomen:Soft. Bowel sounds present. Non-tender.  Extremities: No lower extremity edema.   Echo 01/27/16: - Left ventricle: The cavity size was normal. Systolic function was  mildly reduced. The estimated ejection fraction was in the range  of 45% to 50%. Images were inadequate for LV wall motion  assessment. - Ventricular septum: Septal motion showed paradox. - Left atrium: The atrium was severely dilated. - Right ventricle: The cavity size was mildly dilated. Systolic  function was reduced. - Right atrium: The atrium was mildly dilated. - Pericardium, extracardiac: A small pericardial effusion was  identified posterior to the heart. There was no evidence of  hemodynamic compromise.  Cath 12/29/15: Dominance: Right   Left Main   . Ost LM lesion, 50%  stenosed. Moderately Calcified.     Left Anterior Descending   . Ost LAD to Mid LAD lesion, 50% stenosed. Severely Calcified diffuse.   Royden Purl LAD lesion, 70% stenosed.     Left Circumflex   . Ost Cx to Prox Cx lesion, 70% stenosed. Moderately Calcified.     Right Coronary Artery   . Ost RCA to Prox RCA lesion, 90% stenosed. Severely Calcified.   . Mid RCA lesion, 45% stenosed. Severely Calcified.     EKG:  EKG is ordered today. The ekg ordered today demonstrates Atrial fib, rate 77 bpm. LBBB  Recent Labs: 05/06/2016: ALT 30; Magnesium 1.8; TSH 2.799 05/23/2016: Brain Natriuretic Peptide 315.0; Hemoglobin 11.8; Platelets 280 06/20/2016: BUN 17; Creat 1.12; Potassium 3.9; Sodium 139   Lipid Panel    Component Value Date/Time   CHOL 121 05/07/2016 0246   TRIG 100 05/07/2016 0246   HDL 42 05/07/2016 0246   CHOLHDL 2.9 05/07/2016 0246   VLDL 20 05/07/2016 0246   LDLCALC 59 05/07/2016 0246     Wt Readings from Last 3 Encounters:  07/25/16 120 lb (54.4 kg)  07/20/16 129 lb (58.5 kg)  07/13/16 129 lb (58.5 kg)     Other studies Reviewed: Additional studies/ records that were reviewed today include: . Review of the above records demonstrates:    Assessment and Plan:   1. Chronic combined systolic and diastolic CHF: Volume status is stable on Lasix 40 mg daily (weight still at 120 lbs) but he continues to have recurrence of pleural effusions. I will try a higher dose of Lasix to see if this helps with his chest tube output. He does not appear to be volume overloaded though. Will increase Lasix to 40 mg po BID and check BMET in one week.    2. Dyspnea/hypoxia:  His dyspnea is improved but pleural effusions persist despite pleurodesis x 2 per Dr. Laneta Simmers. Will attempt to diurese him more this week to see if it helps with his effusions. He still has the PleurX catheters in place. Will need f/u with Dr. Laneta Simmers in 1-2 weeks with chest x-ray.   3. CAD:  s/p 4v  CABG (LIMA to LAD,  SVG to OM, seq SVG to PDA/PLA) by Dr. Laneta Simmers 01/07/2016. He is having no angina. Continue ASA, statin, beta blocker.   4. Thoracic aortic aneurysm: Dilated ascending aorta s/p supra-coronary ascending aortic and hemi-arch replacement using a 30 mm Hemashield per Dr. Laneta Simmers in March 2017.   5. Severe AS: s/p aortic valve replacement using 25 mm Edwards Magna-ease pericardial valve march 2017.  6. HTN: Blood pressure controlled. Continue on current medication for now.   7. HLD: continue statin.   8.Persistent atrial fibrillation: He is rate controlled on metoprolol. He has been anticoagulated with Eliquis for several months. I do not think his rate controlled atrial fibrillation is contributing to his pleural effusions and he is overall much improved from a dyspnea standpoint with the drains in place, suggesting his dyspnea is driven by the pleural effusions. Will not plan cardioversion at this time.     Current medicines are reviewed at length with the patient today.  The patient does not have concerns regarding medicines.  The following changes have been made:  no change  Labs/ tests ordered today include:   Orders Placed This Encounter  Procedures  . Basic Metabolic Panel (BMET)  . EKG 12-Lead     Disposition:   FU with me in 6-8 weeks.    Signed, Verne Carrow, MD 07/25/2016 10:19 AM    [facility] [address] [place], [place], [place]  [address] Phone: [phone]; Fax: [phone]

## 2016-07-25 NOTE — Patient Instructions (Signed)
Medication Instructions:  Your physician has recommended you make the following change in your medication: Increase furosemide to 40 mg by mouth twice daily.    Labwork: Your physician recommends that you return for lab work in: one week--September 25,2017.  The lab opens at 7:30 AM   Testing/Procedures: none  Follow-Up: Your physician recommends that you schedule a follow-up appointment in: 2 months--Scheduled for November 8,2017 at 10:15    Any Other Special Instructions Will Be Listed Below (If Applicable).     If you need a refill on your cardiac medications before your next appointment, please call your pharmacy.

## 2016-08-01 ENCOUNTER — Other Ambulatory Visit: Payer: Medicaid Other | Admitting: *Deleted

## 2016-08-01 DIAGNOSIS — I5042 Chronic combined systolic (congestive) and diastolic (congestive) heart failure: Secondary | ICD-10-CM

## 2016-08-01 LAB — BASIC METABOLIC PANEL
BUN: 17 mg/dL (ref 7–25)
CALCIUM: 8.9 mg/dL (ref 8.6–10.3)
CHLORIDE: 101 mmol/L (ref 98–110)
CO2: 29 mmol/L (ref 20–31)
CREATININE: 1.21 mg/dL (ref 0.70–1.25)
GLUCOSE: 100 mg/dL — AB (ref 65–99)
Potassium: 3.7 mmol/L (ref 3.5–5.3)
Sodium: 139 mmol/L (ref 135–146)

## 2016-08-05 ENCOUNTER — Telehealth: Payer: Self-pay | Admitting: *Deleted

## 2016-08-05 MED ORDER — BUDESONIDE-FORMOTEROL FUMARATE 160-4.5 MCG/ACT IN AERO: 2.0000 | INHALATION_SPRAY | Freq: Two times a day (BID) | RESPIRATORY_TRACT | 1 refills | Status: DC

## 2016-08-05 MED ORDER — POTASSIUM CHLORIDE ER 20 MEQ PO TBCR: 20.0000 meq | EXTENDED_RELEASE_TABLET | Freq: Two times a day (BID) | ORAL | 6 refills | Status: DC

## 2016-08-05 NOTE — Telephone Encounter (Signed)
I spoke with pt's sister and told her pt should continue KDur 20 meq twice daily. [facility] [place].  Daughter reports symbicort was started in hospital. She reports primary care refuses to see pt until he is more stable.  Will refill symbicort with one refill. I asked her to check with Dr. Laneta SimmersBartle when pt sees him next week and ask if he will refill as he is following pt's lung issues.

## 2016-08-05 NOTE — Telephone Encounter (Signed)
KDur 20 meq BID and we can refill the Symbicort once so he doesn't run out. This will need to be filled by primary care in the future.

## 2016-08-05 NOTE — Telephone Encounter (Signed)
Patients sister, Lupita LeashDonna called and requested refills on potassium 20 meq bid and symbicort. She stated that pcp will not refill the symbicort due to the patients unstable condition at this time. The new order for potassium bid was not put on the patients chart so I was not sure if he was going to remain on that dose or go back to 20 meq qd. She would like a call back at [phone].

## 2016-08-05 NOTE — Telephone Encounter (Signed)
Will forward to Dr. Clifton JamesMcAlhany regarding potassium dosage long term and see if OK with him to fill symbicort.

## 2016-08-09 ENCOUNTER — Other Ambulatory Visit: Payer: Self-pay | Admitting: Surgery

## 2016-08-09 DIAGNOSIS — J90 Pleural effusion, not elsewhere classified: Secondary | ICD-10-CM

## 2016-08-10 ENCOUNTER — Encounter: Payer: Self-pay | Admitting: Surgery

## 2016-08-10 ENCOUNTER — Ambulatory Visit
Admission: RE | Admit: 2016-08-10 | Discharge: 2016-08-10 | Disposition: A | Discharge status: Home / Self Care | Payer: Medicaid Other | Source: Ambulatory Visit | Attending: Surgery | Admitting: Surgery

## 2016-08-10 ENCOUNTER — Ambulatory Visit (INDEPENDENT_AMBULATORY_CARE_PROVIDER_SITE_OTHER): Payer: Medicaid Other | Admitting: Surgery

## 2016-08-10 VITALS — BP 108/69 | HR 85 | Resp 16 | Ht 69.0 in | Wt 118.0 lb

## 2016-08-10 DIAGNOSIS — I5042 Chronic combined systolic (congestive) and diastolic (congestive) heart failure: Secondary | ICD-10-CM

## 2016-08-10 DIAGNOSIS — Z951 Presence of aortocoronary bypass graft: Secondary | ICD-10-CM

## 2016-08-10 DIAGNOSIS — Z938 Other artificial opening status: Secondary | ICD-10-CM | POA: Diagnosis not present

## 2016-08-10 DIAGNOSIS — Z952 Presence of prosthetic heart valve: Secondary | ICD-10-CM

## 2016-08-10 DIAGNOSIS — J90 Pleural effusion, not elsewhere classified: Secondary | ICD-10-CM

## 2016-08-10 DIAGNOSIS — Z9889 Other specified postprocedural states: Secondary | ICD-10-CM

## 2016-08-10 DIAGNOSIS — Z8679 Personal history of other diseases of the circulatory system: Secondary | ICD-10-CM

## 2016-08-15 ENCOUNTER — Encounter: Payer: Self-pay | Admitting: Surgery

## 2016-08-15 NOTE — Progress Notes (Signed)
HPI:   The patient returns today for follow up of his bilateral pleurX catheters that were placed for refractory, symptomatic pleural effusion though to be due to CHF. We inserted doxycycline for pleurodesis for the second time the tubes continue to drain. He feels well and has no significant shortness of breath, no edema in legs. He is getting tired of having the catheters in but understands the importance. The catheters are continuing to drain about 300 cc every 3 days on each side, usually a little more on the right than on the left.  Current Outpatient Prescriptions  Medication Sig Dispense Refill  . apixaban (ELIQUIS) 5 MG TABS tablet Take 1 tablet (5 mg total) by mouth 2 (two) times daily. 60 tablet 5  . aspirin 81 MG EC tablet Take 1 tablet (81 mg total) by mouth daily. 30 tablet 1  . atorvastatin (LIPITOR) 80 MG tablet Take 1 tablet (80 mg total) by mouth daily. 90 tablet 3  . budesonide-formoterol (SYMBICORT) 160-4.5 MCG/ACT inhaler Inhale 2 puffs into the lungs 2 (two) times daily. 1 Inhaler 1  . furosemide (LASIX) 40 MG tablet Take 1 tablet (40 mg total) by mouth 2 (two) times daily. 180 tablet 1  . metoprolol tartrate (LOPRESSOR) 25 MG tablet Take 0.5 tablets (12.5 mg total) by mouth 2 (two) times daily. 30 tablet 12  . nitroGLYCERIN (NITROSTAT) 0.4 MG SL tablet Place 1 tablet (0.4 mg total) under the tongue every 5 (five) minutes x 3 doses as needed for chest pain. 25 tablet 1  . Potassium Chloride ER 20 MEQ TBCR Take 20 mEq by mouth 2 (two) times daily. 60 tablet 6  . sodium chloride (OCEAN) 0.65 % SOLN nasal spray Place 1 spray into both nostrils as needed for congestion.    . tiotropium (SPIRIVA) 18Marland Kitchen MCG inhalation capsule Place 18 mcg into inhaler and inhale daily.    . traMADol (ULTRAM) 50 MG tablet Take 1 tablet (50 mg total) by mouth every 6 (six) hours as needed for moderate pain. 30 tablet 0   No current facility-administered medications for this visit.       Physical Exam: BP 108/69   Pulse 85   Resp 16   Ht 5\' 9"  (1.753 m)   Wt 118 lb (53.5 kg)   SpO2 95% Comment: ON RA  BMI 17.43 kg/m  He looks well Lung exam shows slight decrease in breath sounds in the bases Heart: IRRR. Ext: no edema in ankles and feet.  Diagnostic Tests:  CLINICAL DATA:  Pleural effusion.  EXAM: CHEST  2 VIEW  COMPARISON:  Radiographs of July 20, 2016.  FINDINGS: Stable cardiomediastinal silhouette. Sternotomy wires are noted. Continued presence of bilateral pleural drainage catheters is noted. Mild left basilar atelectasis or infiltrate is noted with stable minimal pleural effusion. No significant pleural effusion is noted on the right. Old bilateral rib fractures are noted. Minimal right basilar subsegmental atelectasis is noted.  IMPRESSION: Minimal right basilar subsegmental atelectasis. Mild left basilar atelectasis or infiltrate with stable minimal pleural effusion. Continued presence of bilateral pleural drainage catheters.   Electronically Signed   By: Lupita RaiderJames  Green Jr, M.D.   On: 08/10/2016 09:33  Impression:  The catheters continue to drain but it seems like the general trend is downward to about 100 cc per day on average. His CXR looks good and he feels well. I think we should continue the present treatment for now. I don't think there is any benefit to more doxycycline.  I would not recommend a VATS and pleurectomy because any surgery for him has considerable risk with his poor lung function. I discussed this with him and his daughter and they seem to understand.  Plan:  I will see him back in one month with a CXR.   Alleen Borne, MD [facility] [phone]

## 2016-08-17 ENCOUNTER — Ambulatory Visit: Payer: Medicaid Other | Admitting: Surgery

## 2016-09-13 ENCOUNTER — Other Ambulatory Visit: Payer: Self-pay | Admitting: Surgery

## 2016-09-13 DIAGNOSIS — I712 Thoracic aortic aneurysm, without rupture: Secondary | ICD-10-CM

## 2016-09-13 DIAGNOSIS — I7121 Aneurysm of the ascending aorta, without rupture: Secondary | ICD-10-CM

## 2016-09-13 NOTE — Progress Notes (Signed)
Chief Complaint  Patient presents with  . Shortness of Breath     History of Present Illness: 65 yo male with history of CAD s/p CABG, severe aortic stenosis s/p AVR, thoracic aortic aneurysm s/p root replacement, COPD, HTN, HLD, tobacco abuse and atrial fibrillation here today for cardiac follow up. He underwent 4 V CABG, AVR with pericardial valve and aortic root replacement 01/07/16. Post-operative atrial fibrillation and pleural effusions. I saw him 05/06/16 and he was dyspneic in the office with O2 sats 77%. He was admitted and diuresed 5 liters with no improvement in dyspnea. He also has COPD but continued dyspnea not improved with diuresis and felt to be due to pleural effusions. He has been followed closely by Dr. Laneta Simmers and has undergone chest tube placement with doxycycline for pleurodesis x 2. He was seen most recently in the CT surgery office by Dr. Laneta Simmers 08/10/16 and his PleurX catheters continue to drain.   He tells me today that he feels much better. He would like to get the chest tubes out. He only had 100 cc out of each after 8 days. His breathing is much better than it had been. No chest pain.   Primary Care Physician: Rose Fillers, PA-C   Past Medical History:  Diagnosis Date  . Alcohol abuse    Prior  . CAD (coronary artery disease) of bypass graft     4v CABG (LIMA to LAD, SVG to OM, seq SVG to PDA/PLA) by Dr. Laneta Simmers 01/07/2016  . COPD (chronic obstructive pulmonary disease) (HCC)    severe on PFT prior to CABG  . dilated ascending aorta    s/p supra-coronary ascending aortic and hemi-arch replacement using a 30 mm Hemashield under deep hypothermic circulatory arrest during CABG 01/07/2016  . Hyperlipidemia   . Hypertension   . Persistent atrial fibrillation (HCC)    in the setting of NSTEMI and persisted after [facility] 01/2016  . Recurrent left pleural effusion    s/p PleurX drain, occured after CABG  . Recurrent right pleural effusion    s/p PleurX drain, occured  after CABG  . Severe aortic stenosis    s/p aortic valve replacement using 25 mm Edwards Magna-ease pericardial valve by Dr. Laneta Simmers during CABG 01/07/2016  . Tobacco abuse    Prior    Past Surgical History:  Procedure Laterality Date  . CARDIAC CATHETERIZATION N/A 12/29/2015   Procedure: Right/Left Heart Cath and Coronary Angiography;  Surgeon: Peter M Swaziland, MD;  Location: [facility] INVASIVE CV LAB;  Service: Cardiovascular;  Laterality: N/A;  . CHEST TUBE INSERTION Bilateral 01/25/2016   Procedure: INSERTION Bilateral PLEURAL DRAINAGE CATHETER;  Surgeon: Alleen Borne, MD;  Location: [facility] OR;  Service: Thoracic;  Laterality: Bilateral;  . CHEST TUBE INSERTION Bilateral 05/11/2016   Procedure: INSERTION PLEURAL DRAINAGE CATHETER ;  Surgeon: Alleen Borne, MD;  Location: [facility] OR;  Service: Thoracic;  Laterality: Bilateral;  . CORONARY ARTERY BYPASS GRAFT N/A 01/07/2016   Procedure: CORONARY ARTERY BYPASS GRAFTING (CABG), ON PUMP, TIMES FOUR, USING LEFT INTERNAL MAMMARY ARTERY, BILATERAL GREATER SAPHENOUS VEINS HARVESTED ENDOSCOPICALLY;  Surgeon: Alleen Borne, MD;  Location: [facility] OR;  Service: Open Heart Surgery;  Laterality: N/A;  LIMA to LAD, SVG to OM, SVG SEQUENTIALLY to PDA and PLB  . PLEURADESIS Bilateral 05/11/2016   Procedure: PLEURADESIS;  Surgeon: Alleen Borne, MD;  Location[facility] OR;  Service: Thoracic;  Laterality: Bilateral;  . REMOVAL OF PLEURAL DRAINAGE CATHETER Left 03/17/2016   Procedure: REMOVAL OF  PLEURAL DRAINAGE CATHETER;  Surgeon: Alleen Borne, MD;  Location: [facility] OR;  Service: Thoracic;  Laterality: Left;  . ruptured disc repair  2009 or 2010  . TALC PLEURODESIS Bilateral 07/06/2016   Procedure: BILATERAL DOXYCYCLINE PLEURADESIS;  Surgeon: Alleen Borne, MD;  Location: [facility] OR;  Service: Thoracic;  Laterality: Bilateral;  . TEE WITHOUT CARDIOVERSION N/A 01/07/2016   Procedure: TRANSESOPHAGEAL ECHOCARDIOGRAM (TEE);  Surgeon: Alleen Borne, MD;  Location: [facility] OR;  Service: Open Heart Surgery;   Laterality: N/A;  . THORACIC AORTIC ANEURYSM REPAIR N/A 01/07/2016   Procedure: CIRC ARREST AND REPLACEMENT OF ASCENDING THORACIC  ANEURYSM;  Surgeon: Alleen Borne, MD;  Location: [facility] OR;  Service: Open Heart Surgery;  Laterality: N/A;    Current Outpatient Prescriptions  Medication Sig Dispense Refill  . apixaban (ELIQUIS) 5 MG TABS tablet Take 1 tablet (5 mg total) by mouth 2 (two) times daily. 60 tablet 5  . aspirin 81 MG EC tablet Take 1 tablet (81 mg total) by mouth daily. 30 tablet 1  . atorvastatin (LIPITOR) 80 MG tablet Take 1 tablet (80 mg total) by mouth daily. 90 tablet 3  . budesonide-formoterol (SYMBICORT) 160-4.5 MCG/ACT inhaler Inhale 2 puffs into the lungs 2 (two) times daily. 1 Inhaler 1  . furosemide (LASIX) 40 MG tablet Take 1 tablet (40 mg total) by mouth 2 (two) times daily. 180 tablet 1  . metoprolol tartrate (LOPRESSOR) 25 MG tablet Take 0.5 tablets (12.5 mg total) by mouth 2 (two) times daily. 30 tablet 12  . nitroGLYCERIN (NITROSTAT) 0.4 MG SL tablet Place 1 tablet (0.4 mg total) under the tongue every 5 (five) minutes x 3 doses as needed for chest pain. 25 tablet 1  . Potassium Chloride ER 20 MEQ TBCR Take 20 mEq by mouth 2 (two) times daily. 60 tablet 6  . sodium chloride (OCEAN) 0.65 % SOLN nasal spray Place 1 spray into both nostrils as needed for congestion.    Marland Kitchen tiotropium (SPIRIVA) 18 MCG inhalation capsule Place 18 mcg into inhaler and inhale daily.    . traMADol (ULTRAM) 50 MG tablet Take 1 tablet (50 mg total) by mouth every 6 (six) hours as needed for moderate pain. 30 tablet 0   No current facility-administered medications for this visit.     Allergies  Allergen Reactions  . No Known Allergies     Social History   Social History  . Marital status: Single    Spouse name: N/A  . Number of children: 3  . Years of education: GED   Occupational History  . Retired heavy Arboriculturist    Social History Main Topics  . Smoking status: Former  Games developer  . Smokeless tobacco: Never Used     Comment: quit 2010 - up to 3 ppd for 50 years  . Alcohol use No     Comment: quit 2004 - 12 pack per day for 30 years  . Drug use: No     Comment: quit 2015  . Sexual activity: Not on file   Other Topics Concern  . Not on file   Social History Narrative   Lives with son   Drinks one cup of coffee a day and one soda q2-3 days    Family History  Problem Relation Age of Onset  . Stroke Mother   . Diabetes Father   . Stroke Father   . Kidney disease Father   . Kidney disease Sister   . Kidney disease Brother   .  Heart attack Neg Hx     Review of Systems:  As stated in the HPI and otherwise negative.   BP 118/80   Pulse 60   Ht 5\' 9"  (1.753 m)   Wt 121 lb (54.9 kg)   BMI 17.87 kg/m   Physical Examination: General: Well developed, thin male. NAD  HEENT: OP clear, mucus membranes moist  SKIN: warm, dry. No rashes. Neuro: No focal deficits  Musculoskeletal: Muscle strength 5/5 all ext  Psychiatric: Mood and affect normal  Neck: No JVD, no carotid bruits, no thyromegaly, no lymphadenopathy.  Lungs: Crackles bases bilaterally. No wheezes, rhonci, crackles Cardiovascular: Irreg irreg with no loud murmurs. No gallops or rubs. Abdomen:Soft. Bowel sounds present. Non-tender.  Extremities: No lower extremity edema.   Echo 01/27/16: - Left ventricle: The cavity size was normal. Systolic function was  mildly reduced. The estimated ejection fraction was in the range  of 45% to 50%. Images were inadequate for LV wall motion  assessment. - Ventricular septum: Septal motion showed paradox. - Left atrium: The atrium was severely dilated. - Right ventricle: The cavity size was mildly dilated. Systolic  function was reduced. - Right atrium: The atrium was mildly dilated. - Pericardium, extracardiac: A small pericardial effusion was  identified posterior to the heart. There was no evidence of  hemodynamic compromise.  Cath  12/29/15: Dominance: Right   Left Main   . Ost LM lesion, 50% stenosed. Moderately Calcified.     Left Anterior Descending   . Ost LAD to Mid LAD lesion, 50% stenosed. Severely Calcified diffuse.   [place] Purl. Dist LAD lesion, 70% stenosed.     Left Circumflex   . Ost Cx to Prox Cx lesion, 70% stenosed. Moderately Calcified.     Right Coronary Artery   . Ost RCA to Prox RCA lesion, 90% stenosed. Severely Calcified.   . Mid RCA lesion, 45% stenosed. Severely Calcified.     EKG:  EKG is not ordered today. The ekg ordered today demonstrates  Recent Labs: 05/06/2016: ALT 30; Magnesium 1.8; TSH 2.799 05/23/2016: Brain Natriuretic Peptide 315.0; Hemoglobin 11.8; Platelets 280 08/01/2016: BUN 17; Creat 1.21; Potassium 3.7; Sodium 139   Lipid Panel    Component Value Date/Time   CHOL 121 05/07/2016 0246   TRIG 100 05/07/2016 0246   HDL 42 05/07/2016 0246   CHOLHDL 2.9 05/07/2016 0246   VLDL 20 05/07/2016 0246   LDLCALC 59 05/07/2016 0246     Wt Readings from Last 3 Encounters:  09/14/16 121 lb (54.9 kg)  08/10/16 118 lb (53.5 kg)  07/25/16 120 lb (54.4 kg)     Other studies Reviewed: Additional studies/ records that were reviewed today include: . Review of the above records demonstrates:    Assessment and Plan:   1. Chronic combined systolic and diastolic CHF: Volume status is stable on Lasix 40 mg BID.  Check BMET today.   2. Dyspnea/hypoxia:  His dyspnea is improved but pleural effusions persist despite pleurodesis x 2 per Dr. Laneta SimmersBartle. He still has the PleurX catheters in place. Follow up with Dr. Laneta SimmersBartle is planned for later this morning.   3. CAD:  s/p 4v CABG (LIMA to LAD, SVG to OM, seq SVG to PDA/PLA) by Dr. Laneta SimmersBartle 01/07/2016. He is having no angina. Continue ASA, statin, beta blocker.   4. Thoracic aortic aneurysm: Dilated ascending aorta s/p supra-coronary ascending aortic and hemi-arch replacement using a 30 mm Hemashield per Dr. Laneta SimmersBartle in March 2017.   5. Severe AS: s/p  aortic  valve replacement using 25 mm Edwards Magna-ease pericardial valve march 2017.  6. HTN: Blood pressure controlled. Continue on current medication for now.   7. HLD: continue statin.   8.Persistent atrial fibrillation: He is rate controlled on metoprolol. He has been anticoagulated with Eliquis. (6 weeks of samples  Given today). I do not think his rate controlled atrial fibrillation is contributing to his pleural effusions and he is overall much improved from a dyspnea standpoint with the drains in place, suggesting his dyspnea is driven by the pleural effusions. Will not plan cardioversion at this time.     Current medicines are reviewed at length with the patient today.  The patient does not have concerns regarding medicines.  The following changes have been made:  no change  Labs/ tests ordered today include:   Orders Placed This Encounter  Procedures  . Basic Metabolic Panel (BMET)     Disposition:   FU with me in 6 months.     Signed, Verne Carrowhristopher Wendal Wilkie, MD 09/14/2016 10:51 AM    [facility] [address], [place], [place]  [address] Phone: [phone]; Fax: [phone]) [phone]

## 2016-09-14 ENCOUNTER — Ambulatory Visit (INDEPENDENT_AMBULATORY_CARE_PROVIDER_SITE_OTHER): Payer: Medicaid Other | Admitting: Cardiovascular Disease

## 2016-09-14 ENCOUNTER — Ambulatory Visit (INDEPENDENT_AMBULATORY_CARE_PROVIDER_SITE_OTHER): Payer: Medicaid Other | Admitting: Surgery

## 2016-09-14 ENCOUNTER — Ambulatory Visit
Admission: RE | Admit: 2016-09-14 | Discharge: 2016-09-14 | Disposition: A | Discharge status: Home / Self Care | Payer: Medicaid Other | Source: Ambulatory Visit | Attending: Surgery | Admitting: Surgery

## 2016-09-14 ENCOUNTER — Encounter: Payer: Self-pay | Admitting: Surgery

## 2016-09-14 ENCOUNTER — Encounter: Payer: Self-pay | Admitting: Cardiovascular Disease

## 2016-09-14 VITALS — BP 107/72 | HR 75 | Resp 16 | Ht 69.0 in | Wt 121.8 lb

## 2016-09-14 VITALS — BP 118/80 | HR 60 | Ht 69.0 in | Wt 121.0 lb

## 2016-09-14 DIAGNOSIS — I4819 Other persistent atrial fibrillation: Secondary | ICD-10-CM

## 2016-09-14 DIAGNOSIS — I350 Nonrheumatic aortic (valve) stenosis: Secondary | ICD-10-CM

## 2016-09-14 DIAGNOSIS — Z938 Other artificial opening status: Secondary | ICD-10-CM

## 2016-09-14 DIAGNOSIS — J90 Pleural effusion, not elsewhere classified: Secondary | ICD-10-CM

## 2016-09-14 DIAGNOSIS — I2510 Atherosclerotic heart disease of native coronary artery without angina pectoris: Secondary | ICD-10-CM | POA: Diagnosis not present

## 2016-09-14 DIAGNOSIS — I712 Thoracic aortic aneurysm, without rupture: Secondary | ICD-10-CM

## 2016-09-14 DIAGNOSIS — I5042 Chronic combined systolic (congestive) and diastolic (congestive) heart failure: Secondary | ICD-10-CM

## 2016-09-14 DIAGNOSIS — I481 Persistent atrial fibrillation: Secondary | ICD-10-CM | POA: Diagnosis not present

## 2016-09-14 DIAGNOSIS — I480 Paroxysmal atrial fibrillation: Secondary | ICD-10-CM

## 2016-09-14 DIAGNOSIS — I7121 Aneurysm of the ascending aorta, without rupture: Secondary | ICD-10-CM

## 2016-09-14 LAB — BASIC METABOLIC PANEL
BUN: 20 mg/dL (ref 7–25)
CHLORIDE: 101 mmol/L (ref 98–110)
CO2: 24 mmol/L (ref 20–31)
Calcium: 8.9 mg/dL (ref 8.6–10.3)
Creat: 1.18 mg/dL (ref 0.70–1.25)
Glucose, Bld: 99 mg/dL (ref 65–99)
POTASSIUM: 4.6 mmol/L (ref 3.5–5.3)
SODIUM: 136 mmol/L (ref 135–146)

## 2016-09-14 NOTE — Patient Instructions (Addendum)
Medication Instructions:  Your physician recommends that you continue on your current medications as directed. Please refer to the Current Medication list given to you today.   Labwork: Lab work to be done today--BMP  Testing/Procedures: none  Follow-Up: Your physician recommends that you schedule a follow-up appointment in: 6 months. Please call our office in about 3 months to schedule this appointment    Any Other Special Instructions Will Be Listed Below (If Applicable).     If you need a refill on your cardiac medications before your next appointment, please call your pharmacy.   

## 2016-09-14 NOTE — Progress Notes (Signed)
HPI:  Mr. Fredderick ErbSouthern returns today for follow up of his bilateral PleurX catheters. He has been draining them every 8 days. The last time he drained them after 8 days the right catheter put out 75 cc and the left 150 cc. It has been 8 days since it was last done. He feels well, is eating well, not having shortness of breath.  Current Outpatient Prescriptions  Medication Sig Dispense Refill  . apixaban (ELIQUIS) 5 MG TABS tablet Take 1 tablet (5 mg total) by mouth 2 (two) times daily. 60 tablet 5  . aspirin 81 MG EC tablet Take 1 tablet (81 mg total) by mouth daily. 30 tablet 1  . atorvastatin (LIPITOR) 80 MG tablet Take 1 tablet (80 mg total) by mouth daily. 90 tablet 3  . budesonide-formoterol (SYMBICORT) 160-4.5 MCG/ACT inhaler Inhale 2 puffs into the lungs 2 (two) times daily. 1 Inhaler 1  . furosemide (LASIX) 40 MG tablet Take 1 tablet (40 mg total) by mouth 2 (two) times daily. 180 tablet 1  . metoprolol tartrate (LOPRESSOR) 25 MG tablet Take 0.5 tablets (12.5 mg total) by mouth 2 (two) times daily. 30 tablet 12  . nitroGLYCERIN (NITROSTAT) 0.4 MG SL tablet Place 1 tablet (0.4 mg total) under the tongue every 5 (five) minutes x 3 doses as needed for chest pain. 25 tablet 1  . Potassium Chloride ER 20 MEQ TBCR Take 20 mEq by mouth 2 (two) times daily. 60 tablet 6  . sodium chloride (OCEAN) 0.65 % SOLN nasal spray Place 1 spray into both nostrils as needed for congestion.    Marland Kitchen. tiotropium (SPIRIVA) 18 MCG inhalation capsule Place 18 mcg into inhaler and inhale daily.    . traMADol (ULTRAM) 50 MG tablet Take 1 tablet (50 mg total) by mouth every 6 (six) hours as needed for moderate pain. 30 tablet 0   No current facility-administered medications for this visit.      Physical Exam: BP 107/72   Pulse 75   Resp 16   Ht 5\' 9"  (1.753 m)   Wt 121 lb 12.8 oz (55.2 kg)   SpO2 97% Comment: ON RA  BMI 17.99 kg/m  He looks well Lungs are clear  Diagnostic Tests:  CLINICAL DATA:   Status post thoracic aortic aneurysm repair on January 07, 2016. The patient has had PleurX drainage catheter bilaterally since May 10, 2016. No current chest complaints. History of COPD, former smoker, previous CABG.  EXAM: CHEST  2 VIEW  COMPARISON:  PA and lateral chest x-ray of August 10, 2016.  FINDINGS: The lungs remain hyper inflated. The PleurX catheters appears stable at the lung bases. There is a small left pleural effusion which is fairly stable. There is a trace of pleural fluid at the right lung base. There is no pneumothorax. The interstitial markings of both lungs are coarse. There is no alveolar infiltrate. The heart and pulmonary vascularity are normal. There is a prosthetic aortic valve ring. The sternal wires are intact. There is calcification in the wall of the aortic arch. The retrosternal soft tissues appear normal. The thoracic vertebral bodies are preserved in height. Multiple old healed rib fractures are observed bilaterally.  IMPRESSION: Stable small left pleural effusion with trace right pleural effusion. The PleurX catheters are in stable position. Underlying COPD. Post CABG and thoracic aortic aneurysm repair changes, stable. No CHF or pneumonia.  Aortic atherosclerosis.   Electronically Signed   By: David  SwazilandJordan M.D.   On: 09/14/2016 11:38  Impression:  His catheters are not draining much after an 8 day period and CXR shows minimal effusions. Given his marginal lung function and recurrent effusions I am hesitant to remove them yet.   Plan:  He will wait until day 14 to drain the catheters and will let me know how much comes out. Then we will decide about whether to remove them or continue intermittent drainage.   Alleen BorneBryan K Bartle, MD [facility] [phone]

## 2016-10-28 ENCOUNTER — Telehealth: Payer: Self-pay

## 2016-10-28 NOTE — Telephone Encounter (Signed)
Patients, DGT(Donna) called with PleurX drainage amounts. Today the Right PleurX drained 200 cc's, Left PleurX drained 50 cc's/ 10/17/16 Right PleurX drained 600 cc's, Left PleurX drained 200 cc's. 10/03/16 Right PleurX drained 125 cc's, Left PleurX drained 100 cc's  He is currently draining every 14 days. He is also needing PleurX catheter kits. His insurance will not cover and he can not afford them. I will leave some samples of the kits at front desk for pickup.

## 2016-11-06 ENCOUNTER — Emergency Department (HOSPITAL_COMMUNITY): Payer: Self-pay

## 2016-11-06 ENCOUNTER — Emergency Department (HOSPITAL_COMMUNITY)
Admission: EM | Admit: 2016-11-06 | Discharge: 2016-11-06 | Disposition: A | Discharge status: Home / Self Care | Payer: Self-pay | Attending: Emergency Medicine | Admitting: Emergency Medicine

## 2016-11-06 ENCOUNTER — Encounter (HOSPITAL_COMMUNITY): Payer: Self-pay | Admitting: *Deleted

## 2016-11-06 DIAGNOSIS — Z7982 Long term (current) use of aspirin: Secondary | ICD-10-CM | POA: Insufficient documentation

## 2016-11-06 DIAGNOSIS — J449 Chronic obstructive pulmonary disease, unspecified: Secondary | ICD-10-CM | POA: Insufficient documentation

## 2016-11-06 DIAGNOSIS — Z87891 Personal history of nicotine dependence: Secondary | ICD-10-CM | POA: Insufficient documentation

## 2016-11-06 DIAGNOSIS — J90 Pleural effusion, not elsewhere classified: Secondary | ICD-10-CM | POA: Insufficient documentation

## 2016-11-06 DIAGNOSIS — I2510 Atherosclerotic heart disease of native coronary artery without angina pectoris: Secondary | ICD-10-CM | POA: Insufficient documentation

## 2016-11-06 DIAGNOSIS — I10 Essential (primary) hypertension: Secondary | ICD-10-CM | POA: Insufficient documentation

## 2016-11-06 DIAGNOSIS — Z951 Presence of aortocoronary bypass graft: Secondary | ICD-10-CM | POA: Insufficient documentation

## 2016-11-06 DIAGNOSIS — I252 Old myocardial infarction: Secondary | ICD-10-CM | POA: Insufficient documentation

## 2016-11-06 DIAGNOSIS — Z79899 Other long term (current) drug therapy: Secondary | ICD-10-CM | POA: Insufficient documentation

## 2016-11-06 DIAGNOSIS — Z7901 Long term (current) use of anticoagulants: Secondary | ICD-10-CM | POA: Insufficient documentation

## 2016-11-06 DIAGNOSIS — Z955 Presence of coronary angioplasty implant and graft: Secondary | ICD-10-CM | POA: Insufficient documentation

## 2016-11-06 NOTE — ED Triage Notes (Signed)
Pt and family member reports pt has hx of having pleural drainage tubes since march. Has been getting them drained approx q 14 days, was set to pick up the tubes at dr office on Tuesday but pt woke up this am with increase in sob. Pt is able to speak in full sentences and no acute resp distress noted. spo2 89% at triage but pt states its normally low.

## 2016-11-06 NOTE — ED Notes (Signed)
Patient denies pain and is resting comfortably.  

## 2016-11-06 NOTE — ED Provider Notes (Signed)
[facility]-EMERGENCY DEPT Provider Note   CSN: [id number] Arrival date & time: 11/06/16  1522     History   Chief Complaint Chief Complaint  Patient presents with  . Shortness of Breath    HPI Craig [name] is a 65 y.o. male.   Shortness of Breath  This is a recurrent problem. The average episode lasts 2 days. The problem occurs continuously.The problem has been gradually worsening. Associated symptoms include chest pain. Pertinent negatives include no rhinorrhea. He has tried nothing for the symptoms. The treatment provided no relief. He has had prior hospitalizations. He has had prior ED visits. He has had no prior ICU admissions.    Past Medical History:  Diagnosis Date  . Alcohol abuse    Prior  . CAD (coronary artery disease) of bypass graft     4v CABG (LIMA to LAD, SVG to OM, seq SVG to PDA/PLA) by Dr. Laneta Simmers 01/07/2016  . COPD (chronic obstructive pulmonary disease) (HCC)    severe on PFT prior to CABG  . dilated ascending aorta    s/p supra-coronary ascending aortic and hemi-arch replacement using a 30 mm Hemashield under deep hypothermic circulatory arrest during CABG 01/07/2016  . Hyperlipidemia   . Hypertension   . Persistent atrial fibrillation (HCC)    in the setting of NSTEMI and persisted after [facility] 01/2016  . Recurrent left pleural effusion    s/p PleurX drain, occured after CABG  . Recurrent right pleural effusion    s/p PleurX drain, occured after CABG  . Severe aortic stenosis    s/p aortic valve replacement using 25 mm Edwards Magna-ease pericardial valve by Dr. Laneta Simmers during CABG 01/07/2016  . Tobacco abuse    Prior    Patient Active Problem List   Diagnosis Date Noted  . Dyspnea 05/06/2016  . Encounter for therapeutic drug monitoring 02/15/2016  . Supplemental oxygen dependent   . AKI (acute kidney injury) (HCC)   . Peripheral edema   . Hyperkalemia   . Hypoalbuminemia due to protein-calorie malnutrition (HCC)   . Debility 02/03/2016  . History  of sepsis   . History of non-ST elevation myocardial infarction (NSTEMI)   . Coronary artery disease involving coronary bypass graft of native heart without angina pectoris   . History of aortic aneurysm repair   . Chronic obstructive pulmonary disease (HCC)   . Leukocytosis   . Hyponatremia   . Thrombocytopenia (HCC)   . Acute blood loss anemia   . Sepsis (HCC)   . S/P CABG (coronary artery bypass graft)   . Paroxysmal atrial fibrillation (HCC)   . S/P CABG x 4   . Pleural effusion, bilateral   . Acute respiratory acidosis   . Acute renal failure (ARF) (HCC)   . Severe aortic stenosis   . Coronary artery disease involving native coronary artery of native heart without angina pectoris   . Coronary artery disease involving native coronary artery of native heart with unstable angina pectoris (HCC)   . Acute respiratory failure (HCC)   . CAD (coronary artery disease)   . Aortic stenosis   . Acute respiratory failure with hypoxia (HCC) 12/27/2015  . Elevated troponin   . History of hypertension   . History of hyperlipidemia   . Ascending aortic aneurysm (HCC)   . CAP (community acquired pneumonia) 12/26/2015  . NSTEMI (non-ST elevated myocardial infarction) (HCC) 12/26/2015  . COPD (chronic obstructive pulmonary disease) (HCC) 12/26/2015  . Hypoxia 12/26/2015  . Ruptured lumbar disc 02/17/2015  .  Back pain 02/17/2015    Past Surgical History:  Procedure Laterality Date  . CARDIAC CATHETERIZATION N/A 12/29/2015   Procedure: Right/Left Heart Cath and Coronary Angiography;  Surgeon: Peter M SwazilandJordan, MD;  [facility];  Service: Cardiovascular;  Laterality: N/A;  . CHEST TUBE INSERTION Bilateral 01/25/2016   Procedure: INSERTION Bilateral PLEURAL DRAINAGE CATHETER;  Surgeon: Alleen BorneBryan K Bartle, MD;  Location: [facility] OR;  Service: Thoracic;  Laterality: Bilateral;  . CHEST TUBE INSERTION Bilateral 05/11/2016   Procedure: INSERTION PLEURAL DRAINAGE CATHETER ;  Surgeon: Alleen BorneBryan K  Bartle, MD;  Location: [facility] OR;  Service: Thoracic;  Laterality: Bilateral;  . CORONARY ARTERY BYPASS GRAFT N/A 01/07/2016   Procedure: CORONARY ARTERY BYPASS GRAFTING (CABG), ON PUMP, TIMES FOUR, USING LEFT INTERNAL MAMMARY ARTERY, BILATERAL GREATER SAPHENOUS VEINS HARVESTED ENDOSCOPICALLY;  Surgeon: Alleen BorneBryan K Bartle, MD;  Location: [facility] OR;  Service: Open Heart Surgery;  Laterality: N/A;  LIMA to LAD, SVG to OM, SVG SEQUENTIALLY to PDA and PLB  . PLEURADESIS Bilateral 05/11/2016   Procedure: PLEURADESIS;  Surgeon: Alleen BorneBryan K Bartle, MD;  [facility];  Service: Thoracic;  Laterality: Bilateral;  . REMOVAL OF PLEURAL DRAINAGE CATHETER Left 03/17/2016   Procedure: REMOVAL OF PLEURAL DRAINAGE CATHETER;  Surgeon: Alleen BorneBryan K Bartle, MD;  Location: [facility] OR;  Service: Thoracic;  Laterality: Left;  . ruptured disc repair  2009 or 2010  . TALC PLEURODESIS Bilateral 07/06/2016   Procedure: BILATERAL DOXYCYCLINE PLEURADESIS;  Surgeon: Alleen BorneBryan K Bartle, MD;  Location: [facility] OR;  Service: Thoracic;  Laterality: Bilateral;  . TEE WITHOUT CARDIOVERSION N/A 01/07/2016   Procedure: TRANSESOPHAGEAL ECHOCARDIOGRAM (TEE);  Surgeon: Alleen BorneBryan K Bartle, MD;  [facility];  Service: Open Heart Surgery;  Laterality: N/A;  . THORACIC AORTIC ANEURYSM REPAIR N/A 01/07/2016   Procedure: CIRC ARREST AND REPLACEMENT OF ASCENDING THORACIC  ANEURYSM;  Surgeon: Alleen BorneBryan K Bartle, MD;  Location: [facility] OR;  Service: Open Heart Surgery;  Laterality: N/A;       Home Medications    Prior to Admission medications   Medication Sig Start Date End Date Taking? Authorizing Provider  apixaban (ELIQUIS) 5 MG TABS tablet Take 1 tablet (5 mg total) by mouth 2 (two) times daily. 05/06/16  Yes Kathleene Hazelhristopher D McAlhany, MD  aspirin 81 MG EC tablet Take 1 tablet (81 mg total) by mouth daily. 02/12/16  Yes Evlyn KannerPamela S Love, PA-C  atorvastatin (LIPITOR) 80 MG tablet Take 1 tablet (80 mg total) by mouth daily. 05/23/16  Yes Rosalio MacadamiaLori C Gerhardt, NP  furosemide (LASIX) 40 MG tablet Take 1  tablet (40 mg total) by mouth 2 (two) times daily. 07/25/16  Yes Kathleene Hazelhristopher D McAlhany, MD  metoprolol tartrate (LOPRESSOR) 25 MG tablet Take 0.5 tablets (12.5 mg total) by mouth 2 (two) times daily. 05/12/16  Yes Little IshikawaErin E Smith, NP  nitroGLYCERIN (NITROSTAT) 0.4 MG SL tablet Place 1 tablet (0.4 mg total) under the tongue every 5 (five) minutes x 3 doses as needed for chest pain. 05/12/16  Yes Little IshikawaErin E Smith, NP  Potassium Chloride ER 20 MEQ TBCR Take 20 mEq by mouth 2 (two) times daily. 08/05/16  Yes Kathleene Hazelhristopher D McAlhany, MD  tiotropium (SPIRIVA) 18 MCG inhalation capsule Place 18 mcg into inhaler and inhale daily.   Yes Historical Provider, MD  traMADol (ULTRAM) 50 MG tablet Take 1 tablet (50 mg total) by mouth every 6 (six) hours as needed for moderate pain. 05/12/16  Yes Little IshikawaErin E Smith, NP  budesonide-formoterol (SYMBICORT) 160-4.5 MCG/ACT inhaler Inhale 2  puffs into the lungs 2 (two) times daily. Patient not taking: Reported on 11/06/2016 08/05/16   Kathleene Hazelhristopher D McAlhany, MD    Family History Family History  Problem Relation Age of Onset  . Stroke Mother   . Diabetes Father   . Stroke Father   . Kidney disease Father   . Kidney disease Sister   . Kidney disease Brother   . Heart attack Neg Hx     Social History Social History  Substance Use Topics  . Smoking status: Former Games developermoker  . Smokeless tobacco: Never Used     Comment: quit 2010 - up to 3 ppd for 50 years  . Alcohol use No     Comment: quit 2004 - 12 pack per day for 30 years     Allergies   No known allergies   Review of Systems Review of Systems  HENT: Negative for rhinorrhea.   Respiratory: Positive for shortness of breath.   Cardiovascular: Positive for chest pain.  All other systems reviewed and are negative.    Physical Exam Updated Vital Signs BP 129/90   Pulse 92   Temp 98 F (36.7 C) (Oral)   Resp 16   SpO2 99%   Physical Exam  Constitutional: He appears well-developed and well-nourished.  HENT:    Head: Normocephalic and atraumatic.  Eyes: Conjunctivae and EOM are normal.  Neck: Normal range of motion.  Cardiovascular: Normal rate.   Pulmonary/Chest: Effort normal. No respiratory distress. He has decreased breath sounds in the right lower field. He has no wheezes.  Abdominal: He exhibits no distension.  Musculoskeletal: Normal range of motion.  Neurological: He is alert.  Skin: Skin is warm and dry.  Nursing note and vitals reviewed.    ED Treatments / Results  Labs (all labs ordered are listed, but only abnormal results are displayed) Labs Reviewed - No data to display  EKG  EKG Interpretation None       Radiology Dg Chest 2 View  Result Date: 11/06/2016 CLINICAL DATA:  Shortness of breath. History of pleural effusions and PleurX catheters EXAM: CHEST  2 VIEW COMPARISON:  09/14/2016 FINDINGS: Bilateral PleurX catheters remain in place. No pneumothorax. COPD changes. Small bilateral pleural effusions with right lower lobe atelectasis or consolidation. Heart is normal size. Prior CABG and valve replacement. IMPRESSION: Bilateral PleurX catheters. Small bilateral pleural effusions. Dense consolidation or atelectasis in the right lower lobe. COPD. Electronically Signed   By: Charlett NoseKevin  Dover M.D.   On: 11/06/2016 16:30    Procedures Procedures (including critical care time)  Medications Ordered in ED Medications - No data to display   Initial Impression / Assessment and Plan / ED Course  I have reviewed the triage vital signs and the nursing notes.  Pertinent labs & imaging results that were available during my care of the patient were reviewed by me and considered in my medical decision making (see chart for details).  Clinical Course     65 year old male here with recurrent right-sided pleural effusion. Was trying to wait until Tuesday radius see his primary doctor to get the bottles need for drainage however the source of breath got worse. No other symptoms. No  coughing, fevers or other associated symptoms or modifying factors. Patient drained 650 mL of a bloody thin material that is reportedly similar to his previous effusions. His vital signs were stable and he had significant improvement in his symptoms. Patient will continue following up with his primary team for further evaluation and management  of his pleural effusions. No emergent cause indication for admission this time.  Final Clinical Impressions(s) / ED Diagnoses   Final diagnoses:  Pleural effusion    New Prescriptions New Prescriptions   No medications on file     Marily Memos, MD 11/06/16 1900

## 2016-11-06 NOTE — ED Notes (Signed)
Pleur-x kit x2 ordered.

## 2016-11-06 NOTE — ED Notes (Signed)
Pt. And wife drained Pleur-X catheters themselves. 650mL drained out of right and <415mL drained out of the left.

## 2016-11-09 ENCOUNTER — Ambulatory Visit (INDEPENDENT_AMBULATORY_CARE_PROVIDER_SITE_OTHER): Payer: Self-pay | Admitting: Physician Assistant

## 2016-11-09 ENCOUNTER — Encounter: Payer: Self-pay | Admitting: Physician Assistant

## 2016-11-09 VITALS — BP 99/70 | HR 77 | Resp 16 | Ht 69.0 in | Wt 117.0 lb

## 2016-11-09 DIAGNOSIS — Z938 Other artificial opening status: Secondary | ICD-10-CM

## 2016-11-09 DIAGNOSIS — J90 Pleural effusion, not elsewhere classified: Secondary | ICD-10-CM

## 2016-11-09 NOTE — Progress Notes (Signed)
  HPI:  Patient presents today as he is concerned there is an infection around the Pleur X catheters. He denies fever or chills. He was last seen in the office by Dr. Laneta SimmersBartle on 11/08. At that time, he was still draining both Pleur x catheters every 2 weeks. [facility]2016 with complaint of increasing shortness of breath. He did not have anymore bottles to drain his Pleur X catheters. After being given the bottles, he drained 650 ml out of the right and less than 5 ml out of the left. He was discharged home.   Current Outpatient Prescriptions  Medication Sig Dispense Refill  . apixaban (ELIQUIS) 5 MG TABS tablet Take 1 tablet (5 mg total) by mouth 2 (two) times daily. 60 tablet 5  . aspirin 81 MG EC tablet Take 1 tablet (81 mg total) by mouth daily. 30 tablet 1  . atorvastatin (LIPITOR) 80 MG tablet Take 1 tablet (80 mg total) by mouth daily. 90 tablet 3  . budesonide-formoterol (SYMBICORT) 160-4.5 MCG/ACT inhaler Inhale 2 puffs into the lungs 2 (two) times daily. (Patient not taking: Reported on 11/06/2016) 1 Inhaler 1  . furosemide (LASIX) 40 MG tablet Take 1 tablet (40 mg total) by mouth 2 (two) times daily. 180 tablet 1  . metoprolol tartrate (LOPRESSOR) 25 MG tablet Take 0.5 tablets (12.5 mg total) by mouth 2 (two) times daily. 30 tablet 12  . nitroGLYCERIN (NITROSTAT) 0.4 MG SL tablet Place 1 tablet (0.4 mg total) under the tongue every 5 (five) minutes x 3 doses as needed for chest pain. 25 tablet 1  . Potassium Chloride ER 20 MEQ TBCR Take 20 mEq by mouth 2 (two) times daily. 60 tablet 6  . tiotropium (SPIRIVA) 18 MCG inhalation capsule Place 18 mcg into inhaler and inhale daily.    . traMADol (ULTRAM) 50 MG tablet Take 1 tablet (50 mg total) by mouth every 6 (six) hours as needed for moderate pain. 30 tablet 0  Vital Signs: BP 99/7, HR 77, RR 16, oxygen saturation 85% on 2 liters of oxygen via [place]   Physical Exam: Pulmonary- Mostly clear on  the left and slightly diminished right base. Pleur X sites-Clean and dry. No signs of infection.   Impression and Plan: He is currently draining his Pleur X catheters about every 2 weeks. I instructed him to continue with this schedule for his left Pleur X catheter, but if he feels more short of breath, he should drain the right Pleur X catheter sooner. He was instructed to keep a log of drainage days and output. He will return to see Dr. Laneta SimmersBartle in 2 weeks with a PA/LAT CXR. He is to contact the office if there are any questions or problems in the interim.    Ardelle BallsZIMMERMAN,DONIELLE M, PA-C [facility] [phone]

## 2016-11-22 ENCOUNTER — Other Ambulatory Visit: Payer: Self-pay | Admitting: Surgery

## 2016-11-22 DIAGNOSIS — I25119 Atherosclerotic heart disease of native coronary artery with unspecified angina pectoris: Secondary | ICD-10-CM

## 2016-11-23 ENCOUNTER — Encounter: Payer: Self-pay | Admitting: Surgery

## 2016-11-30 ENCOUNTER — Ambulatory Visit (INDEPENDENT_AMBULATORY_CARE_PROVIDER_SITE_OTHER): Payer: Self-pay | Admitting: Surgery

## 2016-11-30 ENCOUNTER — Ambulatory Visit
Admission: RE | Admit: 2016-11-30 | Discharge: 2016-11-30 | Disposition: A | Discharge status: Home / Self Care | Payer: Self-pay | Source: Ambulatory Visit | Attending: Surgery | Admitting: Surgery

## 2016-11-30 ENCOUNTER — Encounter: Payer: Self-pay | Admitting: Surgery

## 2016-11-30 VITALS — BP 116/76 | Resp 18 | Ht 69.0 in | Wt 117.0 lb

## 2016-11-30 DIAGNOSIS — J90 Pleural effusion, not elsewhere classified: Secondary | ICD-10-CM

## 2016-11-30 DIAGNOSIS — I25119 Atherosclerotic heart disease of native coronary artery with unspecified angina pectoris: Secondary | ICD-10-CM

## 2016-11-30 DIAGNOSIS — Z938 Other artificial opening status: Secondary | ICD-10-CM

## 2016-11-30 NOTE — Progress Notes (Signed)
HPI:  The patient returns today for follow up of his bilateral PleurX catheters. He has not had any drainage from either catheter for several weeks. The catheters have been in for almost a year due to refractory pleural effusions despite bilateral Doxycycline pleurodesis twice. He says that his breathing has not been as good as it had been before the catheters stopped draining. He has been using oxygen at home with some exertional shortness of breath. His appetite has been reduced and he has lost some weight.  Current Outpatient Prescriptions  Medication Sig Dispense Refill  . apixaban (ELIQUIS) 5 MG TABS tablet Take 1 tablet (5 mg total) by mouth 2 (two) times daily. 60 tablet 5  . aspirin 81 MG EC tablet Take 1 tablet (81 mg total) by mouth daily. 30 tablet 1  . atorvastatin (LIPITOR) 80 MG tablet Take 1 tablet (80 mg total) by mouth daily. 90 tablet 3  . furosemide (LASIX) 40 MG tablet Take 1 tablet (40 mg total) by mouth 2 (two) times daily. 180 tablet 1  . metoprolol tartrate (LOPRESSOR) 25 MG tablet Take 0.5 tablets (12.5 mg total) by mouth 2 (two) times daily. 30 tablet 12  . mometasone-formoterol (DULERA) 100-5 MCG/ACT AERO Inhale 2 puffs into the lungs 2 (two) times daily.    . nitroGLYCERIN (NITROSTAT) 0.4 MG SL tablet Place 1 tablet (0.4 mg total) under the tongue every 5 (five) minutes x 3 doses as needed for chest pain. 25 tablet 1  . Potassium Chloride ER 20 MEQ TBCR Take 20 mEq by mouth 2 (two) times daily. 60 tablet 6  . tiotropium (SPIRIVA) 18 MCG inhalation capsule Place 18 mcg into inhaler and inhale daily.    . traMADol (ULTRAM) 50 MG tablet Take 1 tablet (50 mg total) by mouth every 6 (six) hours as needed for moderate pain. 30 tablet 0   No current facility-administered medications for this visit.      Physical Exam: BP 116/76 (BP Location: Right Arm, Patient Position: Sitting, Cuff Size: Normal)   Resp 18   Ht 5\' 9"  (1.753 m)   Wt 117 lb (53.1 kg)   SpO2 95%  Comment: ON 2L O2  BMI 17.28 kg/m  He looks thin and chronically ill Lungs sounds are decreased throughout due to severe COPD   Diagnostic Tests:  CLINICAL DATA:  Talc pleurodesis.  EXAM: CHEST  2 VIEW  COMPARISON:  11/06/2016  FINDINGS: Right base collapse/ consolidation slightly improved with persistent small to moderate right pleural effusion. Right pleural drain remains in place. Scarring or small fluid identified left costophrenic angle with left pleural drain again visualized. The cardio pericardial silhouette is enlarged. Interstitial markings are diffusely coarsened with chronic features. Multiple nonacute bilateral rib fractures. Patient is status post median sternotomy.  IMPRESSION: 1. Slight interval improvement in right basilar aeration. 2. Persistent right greater than left effusions with pleural catheters again visualized bilaterally.   Electronically Signed   By: Kennith Center M.D.   On: 11/30/2016 15:45   Impression:  The PleurX catheters have stopped draining and can be removed. There is a small right pleural effusion with right base atelectasis that looks better compared to his last CXR on 11/06/2016 but new compared to CXR on 09/14/2016 when the catheters were draining. The left side looks fairly clear with minimal fluid in the CPA. Hopefully this right pleural effusion will not progress and maybe resolve. I think his SOB and oxygen dependence are likely related to his severe  COPD with the right base collapse. His lungs are so bad that any effusion or atelectasis has resulted in SOB in the past. If if breathing worsens he should have a CT chest to assess the pleural spaces.   Plan:  Schedule for removal of the catheters in Short Stay. I told his sister to bring back to the office if his breathing worsens so that he can be seen with a CT chest.   Alleen BorneBryan K Blu Lori, MD [facility] [phone]

## 2016-12-01 ENCOUNTER — Other Ambulatory Visit: Payer: Self-pay | Admitting: *Deleted

## 2016-12-01 DIAGNOSIS — J90 Pleural effusion, not elsewhere classified: Secondary | ICD-10-CM

## 2016-12-02 ENCOUNTER — Ambulatory Visit (HOSPITAL_COMMUNITY)
Admission: RE | Admit: 2016-12-02 | Discharge: 2016-12-02 | Disposition: A | Discharge status: Home / Self Care | Payer: Self-pay | Source: Ambulatory Visit | Attending: Surgery | Admitting: Surgery

## 2016-12-02 ENCOUNTER — Encounter (HOSPITAL_COMMUNITY)
Admission: RE | Disposition: A | Discharge status: Home / Self Care | Payer: Self-pay | Source: Ambulatory Visit | Attending: Surgery

## 2016-12-02 ENCOUNTER — Encounter (HOSPITAL_COMMUNITY): Payer: Self-pay | Admitting: *Deleted

## 2016-12-02 DIAGNOSIS — J449 Chronic obstructive pulmonary disease, unspecified: Secondary | ICD-10-CM | POA: Insufficient documentation

## 2016-12-02 DIAGNOSIS — Z79899 Other long term (current) drug therapy: Secondary | ICD-10-CM | POA: Insufficient documentation

## 2016-12-02 DIAGNOSIS — R0602 Shortness of breath: Secondary | ICD-10-CM | POA: Insufficient documentation

## 2016-12-02 DIAGNOSIS — Z7901 Long term (current) use of anticoagulants: Secondary | ICD-10-CM | POA: Insufficient documentation

## 2016-12-02 DIAGNOSIS — J9811 Atelectasis: Secondary | ICD-10-CM | POA: Insufficient documentation

## 2016-12-02 DIAGNOSIS — J90 Pleural effusion, not elsewhere classified: Secondary | ICD-10-CM | POA: Insufficient documentation

## 2016-12-02 DIAGNOSIS — Z9981 Dependence on supplemental oxygen: Secondary | ICD-10-CM | POA: Insufficient documentation

## 2016-12-02 DIAGNOSIS — Z4682 Encounter for fitting and adjustment of non-vascular catheter: Secondary | ICD-10-CM | POA: Insufficient documentation

## 2016-12-02 HISTORY — PX: REMOVAL OF PLEURAL DRAINAGE CATHETER: SHX5080

## 2016-12-02 SURGERY — REMOVAL, CLOSED DRAINAGE CATHETER SYSTEM, PLEURAL
Anesthesia: Monitor Anesthesia Care | Laterality: Bilateral

## 2016-12-02 NOTE — Progress Notes (Signed)
      [address]       [place] [id number]             [phone]     Brief procedure note:                 Bilateral pleurx catheters removed using usual technique.   8 cc 2% local lidocaine  Patient tolerated well   Ulla Mckiernan E, PA-C

## 2016-12-02 NOTE — Discharge Instructions (Signed)
Keep dressings on for 24 hours then may shower

## 2016-12-02 NOTE — Progress Notes (Signed)
Verbal instructions give to patient.  Remove dressing after 24 hours will follow up in office next week for stitches to be removed.  Patient verbalized understanding

## 2016-12-08 ENCOUNTER — Encounter (HOSPITAL_COMMUNITY): Payer: Self-pay | Admitting: Surgery

## 2016-12-09 ENCOUNTER — Encounter (INDEPENDENT_AMBULATORY_CARE_PROVIDER_SITE_OTHER): Payer: Self-pay

## 2016-12-09 DIAGNOSIS — J90 Pleural effusion, not elsewhere classified: Secondary | ICD-10-CM

## 2016-12-09 DIAGNOSIS — Z4802 Encounter for removal of sutures: Secondary | ICD-10-CM

## 2017-02-02 ENCOUNTER — Other Ambulatory Visit: Payer: Self-pay | Admitting: Cardiovascular Disease

## 2017-02-05 ENCOUNTER — Other Ambulatory Visit: Payer: Self-pay | Admitting: Cardiovascular Disease

## 2017-02-06 ENCOUNTER — Other Ambulatory Visit: Payer: Self-pay | Admitting: Cardiovascular Disease

## 2017-02-07 NOTE — Telephone Encounter (Signed)
Please send to primary care

## 2017-02-08 ENCOUNTER — Other Ambulatory Visit: Payer: Self-pay | Admitting: Nurse Practitioner

## 2017-02-23 ENCOUNTER — Encounter: Payer: Self-pay | Admitting: Cardiovascular Disease

## 2017-02-28 ENCOUNTER — Ambulatory Visit: Payer: Self-pay | Admitting: Pharmacist

## 2017-02-28 DIAGNOSIS — I480 Paroxysmal atrial fibrillation: Secondary | ICD-10-CM

## 2017-02-28 DIAGNOSIS — Z5181 Encounter for therapeutic drug level monitoring: Secondary | ICD-10-CM

## 2017-03-01 ENCOUNTER — Telehealth: Payer: Self-pay | Admitting: Cardiovascular Disease

## 2017-03-01 NOTE — Telephone Encounter (Signed)
New Message:   Craig [name] wants to know if Dr Clifton James is the doctor who wrote the prescription for the pt's oxygen?

## 2017-03-03 NOTE — Telephone Encounter (Signed)
Left message to call back  

## 2017-03-03 NOTE — Telephone Encounter (Signed)
I spoke with Craig [name]. He states we can disregard this message. It has been handled by another provider.

## 2017-03-16 ENCOUNTER — Ambulatory Visit (INDEPENDENT_AMBULATORY_CARE_PROVIDER_SITE_OTHER): Payer: PPO | Admitting: Cardiovascular Disease

## 2017-03-16 ENCOUNTER — Encounter: Payer: Self-pay | Admitting: Cardiovascular Disease

## 2017-03-16 ENCOUNTER — Encounter (INDEPENDENT_AMBULATORY_CARE_PROVIDER_SITE_OTHER): Payer: Self-pay

## 2017-03-16 VITALS — BP 118/70 | HR 77 | Ht 69.0 in | Wt 111.0 lb

## 2017-03-16 DIAGNOSIS — I10 Essential (primary) hypertension: Secondary | ICD-10-CM | POA: Diagnosis not present

## 2017-03-16 DIAGNOSIS — I2510 Atherosclerotic heart disease of native coronary artery without angina pectoris: Secondary | ICD-10-CM | POA: Diagnosis not present

## 2017-03-16 DIAGNOSIS — I4819 Other persistent atrial fibrillation: Secondary | ICD-10-CM

## 2017-03-16 DIAGNOSIS — J449 Chronic obstructive pulmonary disease, unspecified: Secondary | ICD-10-CM

## 2017-03-16 DIAGNOSIS — I350 Nonrheumatic aortic (valve) stenosis: Secondary | ICD-10-CM | POA: Diagnosis not present

## 2017-03-16 DIAGNOSIS — I481 Persistent atrial fibrillation: Secondary | ICD-10-CM | POA: Diagnosis not present

## 2017-03-16 DIAGNOSIS — E7800 Pure hypercholesterolemia, unspecified: Secondary | ICD-10-CM | POA: Diagnosis not present

## 2017-03-16 DIAGNOSIS — I5042 Chronic combined systolic (congestive) and diastolic (congestive) heart failure: Secondary | ICD-10-CM | POA: Diagnosis not present

## 2017-03-16 DIAGNOSIS — I712 Thoracic aortic aneurysm, without rupture, unspecified: Secondary | ICD-10-CM

## 2017-03-16 NOTE — Patient Instructions (Addendum)
Medication Instructions:  Your physician recommends that you continue on your current medications as directed. Please refer to the Current Medication list given to you today.   Labwork: none  Testing/Procedures: none  Follow-Up: Your physician recommends that you schedule a follow-up appointment in: 6 months. Please call our office in about 3 months to schedule this appointment.   You have been referred to Pulmonary.  (Dr. Vassie LollAlva, Marchelle Gearingamaswamy or [place])   Any Other Special Instructions Will Be Listed Below (If Applicable).     If you need a refill on your cardiac medications before your next appointment, please call your pharmacy.

## 2017-03-16 NOTE — Progress Notes (Signed)
Chief Complaint  Patient presents with  . Follow-up    CAD     History of Present Illness: 66 yo male with history of CAD s/p CABG, severe AS s/p AVR, thoracic aortic aneurysm s/p aortic root replacement, COPD, HTN, HLD, PAF and tobacco abuse here today for follow up. He underwent 4 V CABG, AVR with pericardial valve and aortic root replacement 01/07/16 which was complicated by atrial fib and pleural effusions. He was seen in my office June 2017 with dyspnea and hypoxia. No improvement during admission that month with diuresis. He has been followed by Dr. Laneta Simmers with chest tube drainage and pleurodesis for treatment of pleural effusions.  Pleurex catheters removed January 2018. Echo July 2017 with normal LV systolic function.   He is here today for follow up. The patient denies any chest pain, dyspnea, palpitations, lower extremity edema, orthopnea, PND, dizziness, near syncope or syncope.  .   Primary Care Physician: Teodoro Kil   Past Medical History:  Diagnosis Date  . Alcohol abuse    Prior  . CAD (coronary artery disease) of bypass graft     4v CABG (LIMA to LAD, SVG to OM, seq SVG to PDA/PLA) by Dr. Laneta Simmers 01/07/2016  . COPD (chronic obstructive pulmonary disease) (HCC)    severe on PFT prior to CABG  . dilated ascending aorta    s/p supra-coronary ascending aortic and hemi-arch replacement using a 30 mm Hemashield under deep hypothermic circulatory arrest during CABG 01/07/2016  . Hyperlipidemia   . Hypertension   . Persistent atrial fibrillation (HCC)    in the setting of NSTEMI and persisted after [facility] 01/2016  . Recurrent left pleural effusion    s/p PleurX drain, occured after CABG  . Recurrent right pleural effusion    s/p PleurX drain, occured after CABG  . Severe aortic stenosis    s/p aortic valve replacement using 25 mm Edwards Magna-ease pericardial valve by Dr. Laneta Simmers during CABG 01/07/2016  . Tobacco abuse    Prior    Past Surgical History:    Procedure Laterality Date  . CARDIAC CATHETERIZATION N/A 12/29/2015   Procedure: Right/Left Heart Cath and Coronary Angiography;  Surgeon: Peter M Swaziland, MD;  Location: [facility] INVASIVE CV LAB;  Service: Cardiovascular;  Laterality: N/A;  . CHEST TUBE INSERTION Bilateral 01/25/2016   Procedure: INSERTION Bilateral PLEURAL DRAINAGE CATHETER;  Surgeon: Alleen Borne, MD;  [facility] OR;  Service: Thoracic;  Laterality: Bilateral;  . CHEST TUBE INSERTION Bilateral 05/11/2016   Procedure: INSERTION PLEURAL DRAINAGE CATHETER ;  Surgeon: Alleen Borne, MD;  Location: [facility] OR;  Service: Thoracic;  Laterality: Bilateral;  . CORONARY ARTERY BYPASS GRAFT N/A 01/07/2016   Procedure: CORONARY ARTERY BYPASS GRAFTING (CABG), ON PUMP, TIMES FOUR, USING LEFT INTERNAL MAMMARY ARTERY, BILATERAL GREATER SAPHENOUS VEINS HARVESTED ENDOSCOPICALLY;  Surgeon: Alleen Borne, MD;  Location: [facility] OR;  Service: Open Heart Surgery;  Laterality: N/A;  LIMA to LAD, SVG to OM, SVG SEQUENTIALLY to PDA and PLB  . PLEURADESIS Bilateral 05/11/2016   Procedure: PLEURADESIS;  Surgeon: Alleen Borne, MD;  Location: [facility] OR;  Service: Thoracic;  Laterality: Bilateral;  . REMOVAL OF PLEURAL DRAINAGE CATHETER Left 03/17/2016   Procedure: REMOVAL OF PLEURAL DRAINAGE CATHETER;  Surgeon: Alleen Borne, MD;  [facility]: [facility] OR;  Service: Thoracic;  Laterality: Left;  . REMOVAL OF PLEURAL DRAINAGE CATHETER Bilateral 12/02/2016   Procedure: REMOVAL OF PLEURAL DRAINAGE CATHETER;  Surgeon: Alleen Borne, MD;  Location: [facility]  OR;  Service: Thoracic;  Laterality: Bilateral;  . ruptured disc repair  2009 or 2010  . TALC PLEURODESIS Bilateral 07/06/2016   Procedure: BILATERAL DOXYCYCLINE PLEURADESIS;  Surgeon: Alleen Borne, MD;  [facility] OR;  Service: Thoracic;  Laterality: Bilateral;  . TEE WITHOUT CARDIOVERSION N/A 01/07/2016   Procedure: TRANSESOPHAGEAL ECHOCARDIOGRAM (TEE);  Surgeon: Alleen Borne, MD;  Location: [facility] OR;  Service: Open Heart Surgery;   Laterality: N/A;  . THORACIC AORTIC ANEURYSM REPAIR N/A 01/07/2016   Procedure: CIRC ARREST AND REPLACEMENT OF ASCENDING THORACIC  ANEURYSM;  Surgeon: Alleen Borne, MD;  [facility] OR;  Service: Open Heart Surgery;  Laterality: N/A;    Current Outpatient Prescriptions  Medication Sig Dispense Refill  . apixaban (ELIQUIS) 5 MG TABS tablet Take 1 tablet (5 mg total) by mouth 2 (two) times daily. 60 tablet 5  . aspirin 81 MG EC tablet Take 1 tablet (81 mg total) by mouth daily. 30 tablet 1  . atorvastatin (LIPITOR) 80 MG tablet Take 1 tablet (80 mg total) by mouth daily. 90 tablet 3  . furosemide (LASIX) 40 MG tablet Take 40 mg by mouth daily.    . metoprolol tartrate (LOPRESSOR) 25 MG tablet Take 0.5 tablets (12.5 mg total) by mouth 2 (two) times daily. 30 tablet 12  . nitroGLYCERIN (NITROSTAT) 0.4 MG SL tablet Place 1 tablet (0.4 mg total) under the tongue every 5 (five) minutes x 3 doses as needed for chest pain. 25 tablet 1  . Potassium Chloride ER 20 MEQ TBCR Take 20 mEq by mouth 2 (two) times daily. 60 tablet 6  . sodium chloride (OCEAN) 0.65 % SOLN nasal spray Place 1 spray into both nostrils as needed for congestion.     No current facility-administered medications for this visit.     Allergies  Allergen Reactions  . No Known Allergies Other (See Comments)    Social History   Social History  . Marital status: Single    Spouse name: N/A  . Number of children: 3  . Years of education: GED   Occupational History  . Retired heavy Arboriculturist    Social History Main Topics  . Smoking status: Former Games developer  . Smokeless tobacco: Never Used     Comment: quit 2010 - up to 3 ppd for 50 years  . Alcohol use No     Comment: quit 2004 - 12 pack per day for 30 years  . Drug use: No     Comment: quit 2015  . Sexual activity: Not on file   Other Topics Concern  . Not on file   Social History Narrative   Lives with son   Drinks one cup of coffee a day and one soda q2-3  days    Family History  Problem Relation Age of Onset  . Stroke Mother   . Diabetes Father   . Stroke Father   . Kidney disease Father   . Kidney disease Sister   . Kidney disease Brother   . Heart attack Neg Hx     Review of Systems:  As stated in the HPI and otherwise negative.   BP 118/70   Pulse 77   Ht 5\' 9"  (1.753 m)   Wt 111 lb (50.3 kg)   BMI 16.39 kg/m   Physical Examination:  General: Well developed, well nourished, NAD  HEENT: OP clear, mucus membranes moist  SKIN: warm, dry. No rashes. Neuro: No focal deficits  Musculoskeletal: Muscle strength 5/5 all  ext  Psychiatric: Mood and affect normal  Neck: No JVD, no carotid bruits, no thyromegaly, no lymphadenopathy.  Lungs:Clear bilaterally, no wheezes, rhonci, crackles Cardiovascular: Regular rate and rhythm. No murmurs, gallops or rubs. Abdomen:Soft. Bowel sounds present. Non-tender.  Extremities: No lower extremity edema. Pulses are 2 + in the bilateral DP/PT.  Echo July 2017: Left ventricle: The cavity size was normal. Wall thickness was   increased in a pattern of mild LVH. Systolic function was normal.   The estimated ejection fraction was in the range of 55% to 60%.   Wall motion was normal; there were no regional wall motion   abnormalities. - Ventricular septum: The contour showed diastolic flattening and   systolic flattening. - Aortic valve: A bioprosthesis was present. There was trivial   regurgitation. - Mitral valve: There was mild regurgitation. - Left atrium: The atrium was mildly dilated. - Pericardium, extracardiac: A small pericardial effusion was   identified. There was a left pleural effusion.  Impressions:  - Normal LV systolic function; mild LVH; septal flattening   suggestive of pulmonary hypertension; miild LAE; s/p AVR with   normal gradient and trace AI; mild MR.   Cath 12/29/15: Dominance: Right   Left Main   . Ost LM lesion, 50% stenosed. Moderately Calcified.      Left Anterior Descending   . Ost LAD to Mid LAD lesion, 50% stenosed. Severely Calcified diffuse.   Royden Purl. Dist LAD lesion, 70% stenosed.     Left Circumflex   . Ost Cx to Prox Cx lesion, 70% stenosed. Moderately Calcified.     Right Coronary Artery   . Ost RCA to Prox RCA lesion, 90% stenosed. Severely Calcified.   . Mid RCA lesion, 45% stenosed. Severely Calcified.     EKG:  EKG is ordered today. The ekg ordered today demonstrates Atrial fib, rate70 bpm. IVCD. Non-specific T wave abn.   Recent Labs: 05/06/2016: ALT 30; Magnesium 1.8; TSH 2.799 05/23/2016: Brain Natriuretic Peptide 315.0; Hemoglobin 11.8; Platelets 280 09/14/2016: BUN 20; Creat 1.18; Potassium 4.6; Sodium 136   Lipid Panel    Component Value Date/Time   CHOL 121 05/07/2016 0246   TRIG 100 05/07/2016 0246   HDL 42 05/07/2016 0246   CHOLHDL 2.9 05/07/2016 0246   VLDL 20 05/07/2016 0246   LDLCALC 59 05/07/2016 0246     Wt Readings from Last 3 Encounters:  03/16/17 111 lb (50.3 kg)  11/30/16 117 lb (53.1 kg)  11/09/16 117 lb (53.1 kg)     Other studies Reviewed: Additional studies/ records that were reviewed today include: . Review of the above records demonstrates:    Assessment and Plan:   1. Chronic combined systolic and diastolic CHF: Volume status is stable today on Lasix 40 mg po daily  2. CAD without angina: He is having no chest ain suggestive of angina. He is s/p 4V CABG in March of 2017. Will continue ASA, statin and beta blocker.    4. Thoracic aortic aneurysm: He is s/p supra-coronary ascending aortic and hemi arch replacement in March 2017.    5. Severe AS: He is s/p AVR with pericardial valve March 2017.   6. HTN: BP is controlled. No changes.   7. EAV:WUJWHLD:Will continue statin. Repeat lipids at next visit in 6 months.   8. Persistent atrial fibrillation: Rate controlled. Continue beta blocker and Eliquis.     9. COPD He has severe COPD. He remains on supplemental oxygen. He is asking today  for assistance with pulmonary medications  and oxygen orders. Will refer to [facility].    Current medicines are reviewed at length with the patient today.  The patient does not have concerns regarding medicines.  The following changes have been made:  no change  Labs/ tests ordered today include:   Orders Placed This Encounter  Procedures  . Ambulatory referral to Pulmonology  . EKG 12-Lead     Disposition:   FU with me in 6 months.     Signed, Verne Carrow, MD 03/16/2017 10:11 AM    [facility] [address] [place], [place], [place]  [address] Phone: [phone]; Fax: [phone]

## 2017-03-19 ENCOUNTER — Telehealth: Payer: Self-pay | Admitting: Pulmonary Disease

## 2017-03-19 NOTE — Telephone Encounter (Signed)
PFT 01/04/16: FVC 3.16 L (81%) FEV1 1.20 L (35%) FEV1/FVC 0.33 FEF 25-75 0.31 L (11%) no bronchodilator response TLC 6.17 L (90%) RV 102% ERV 141% DLCO corrected 28%  IMAGING CXR PA/LAT 11/30/16 (personally reviewed by me): Bilateral pigtail pleural catheter 7 place. Right greater than left bilateral pleural effusions. Fluid within right fissure. There are living of the chest noted. Median sternotomy wires noted. Heart normal in size & mediastinum normal in contour.  CARDIAC TTE 05/07/16:  Mild LVH with EF 55-60%. No regional wall motion abnormalities. Unable to assess diastolic function due to atrial fibrillation. LA mildly dilated & RA normal in size. RV normal in size and function. Bioprosthetic aortic valve with trivial regurgitation and no stenosis. Aortic root normal in size. Mild mitral regurgitation without stenosis. No pulmonic stenosis. Trivial tricuspid regurgitation. Small pericardial effusion.  LHC 12/29/15:  Ost RCA to Prox RCA lesion, 90% stenosed.  Mid RCA lesion, 45% stenosed.  Ost LM lesion, 50% stenosed.  Ost LAD to Mid LAD lesion, 50% stenosed.  Dist LAD lesion, 70% stenosed.  Ost Cx to Prox Cx lesion, 70% stenosed.  RHC 12/29/15:  RA 15/10 (10)  RV 59/8   RV EDP 11  PA 64/34 (51)  PW 33  AO 113/85 (99)  MICROBIOLOGY Left Pleural Effusion Culture (01/17/16):  Negative Right Pleural Effusion Culture (01/16/16):  Negative  Tracheal Aspirate Culture (12/27/15):  Normal Respiratory Flora

## 2017-03-20 ENCOUNTER — Other Ambulatory Visit: Payer: PPO

## 2017-03-20 ENCOUNTER — Telehealth: Payer: Self-pay | Admitting: Pulmonary Disease

## 2017-03-20 ENCOUNTER — Encounter: Payer: Self-pay | Admitting: Pulmonary Disease

## 2017-03-20 ENCOUNTER — Ambulatory Visit (INDEPENDENT_AMBULATORY_CARE_PROVIDER_SITE_OTHER)
Admission: RE | Admit: 2017-03-20 | Discharge: 2017-03-20 | Disposition: A | Discharge status: Home / Self Care | Payer: PPO | Source: Ambulatory Visit | Attending: Pulmonary Disease | Admitting: Pulmonary Disease

## 2017-03-20 ENCOUNTER — Ambulatory Visit: Payer: PPO | Admitting: Pulmonary Disease

## 2017-03-20 ENCOUNTER — Ambulatory Visit (INDEPENDENT_AMBULATORY_CARE_PROVIDER_SITE_OTHER): Payer: PPO | Admitting: Pulmonary Disease

## 2017-03-20 VITALS — BP 120/80 | HR 71 | Ht 69.0 in | Wt 111.0 lb

## 2017-03-20 DIAGNOSIS — J449 Chronic obstructive pulmonary disease, unspecified: Secondary | ICD-10-CM

## 2017-03-20 DIAGNOSIS — R042 Hemoptysis: Secondary | ICD-10-CM | POA: Diagnosis not present

## 2017-03-20 DIAGNOSIS — J9611 Chronic respiratory failure with hypoxia: Secondary | ICD-10-CM | POA: Diagnosis not present

## 2017-03-20 DIAGNOSIS — R269 Unspecified abnormalities of gait and mobility: Secondary | ICD-10-CM | POA: Diagnosis not present

## 2017-03-20 DIAGNOSIS — J90 Pleural effusion, not elsewhere classified: Secondary | ICD-10-CM | POA: Diagnosis not present

## 2017-03-20 NOTE — Patient Instructions (Addendum)
   Call me if you notice more blood in your phlegm.  Let me know if you have any other questions or concerns.  We will review your test results at your follow-up.  TESTS ORDERED: 1. Full PFTs on or before next appointment 2. with oxygen titration at next appointment 3. Overnight oximetry on room air ([facility]) 4. Serum alpha-1 antitrypsin phenotype today 5. CXR PA/LAT today

## 2017-03-20 NOTE — Addendum Note (Signed)
Addended by: Pamalee LeydenWIGGINS, Soley Harriss J on: 03/20/2017 11:13 AM   Modules accepted: Orders

## 2017-03-20 NOTE — Progress Notes (Addendum)
Subjective:    Patient ID: Craig [name], male    DOB: November 11, 1950, 66 y.o.   MRN: [id number]  HPI Patient has an extensive history of breathing problems. History of bilateral pleural effusions post percutaneous tube placement & pleurodesis. He reports problems breathing with extremes in heat & cold. He reports he is only able to ambulate a few feet due to his dyspnea. He did have to take early retirement with his breathing problems. Denies any wheezing. He reports intermittent cough productive of a clear phlegm. He reports a cough producing a blood-tinged mucus 1-2 times per week but reports this could be due to bleeding from his nose but no epistaxis. Previously did have epistaxis on Coumadin and underwent cauterization. He denies any chest pain or pressure. No fever, chills, or sweats. No abdominal pain, nausea or emesis. He has lost 30 pounds since March of 2017. No history of breathing problems as a child. No history of recurrent bronchitis. Currently not on any breathing medications. Previously was on Symbicort & Spiriva without any symptomatic benefit. He reports he has been on oxygen therapy since March 2017. He uses 2 L/m with exertion and purchased his own portable concentrator. He does use it at night while sleeping as well.  Review of Systems No rashes but does bruise easily on Eliquis. No sinus congestion, drainage or pressure. A pertinent 14 point review of systems is negative except as per the history of presenting illness.  Allergies  Allergen Reactions  . No Known Allergies Other (See Comments)    Current Outpatient Prescriptions on File Prior to Visit  Medication Sig Dispense Refill  . apixaban (ELIQUIS) 5 MG TABS tablet Take 1 tablet (5 mg total) by mouth 2 (two) times daily. 60 tablet 5  . aspirin 81 MG EC tablet Take 1 tablet (81 mg total) by mouth daily. 30 tablet 1  . atorvastatin (LIPITOR) 80 MG tablet Take 1 tablet (80 mg total) by mouth daily. 90 tablet 3  .  furosemide (LASIX) 40 MG tablet Take 40 mg by mouth daily.    . metoprolol tartrate (LOPRESSOR) 25 MG tablet Take 0.5 tablets (12.5 mg total) by mouth 2 (two) times daily. 30 tablet 12  . nitroGLYCERIN (NITROSTAT) 0.4 MG SL tablet Place 1 tablet (0.4 mg total) under the tongue every 5 (five) minutes x 3 doses as needed for chest pain. 25 tablet 1  . Potassium Chloride ER 20 MEQ TBCR Take 20 mEq by mouth 2 (two) times daily. 60 tablet 6  . sodium chloride (OCEAN) 0.65 % SOLN nasal spray Place 1 spray into both nostrils as needed for congestion.     No current facility-administered medications on file prior to visit.     Past Medical History:  Diagnosis Date  . Alcohol abuse    Prior  . CAD (coronary artery disease) of bypass graft     4v CABG (LIMA to LAD, SVG to OM, seq SVG to PDA/PLA) by Dr. Laneta Simmers 01/07/2016  . COPD (chronic obstructive pulmonary disease) (HCC)    severe on PFT prior to CABG  . dilated ascending aorta    s/p supra-coronary ascending aortic and hemi-arch replacement using a 30 mm Hemashield under deep hypothermic circulatory arrest during CABG 01/07/2016  . Hyperlipidemia   . Hypertension   . Persistent atrial fibrillation (HCC)    in the setting of NSTEMI and persisted after [facility] 01/2016  . Recurrent left pleural effusion    s/p PleurX drain, occured after CABG  .  Recurrent right pleural effusion    s/p PleurX drain, occured after CABG  . Severe aortic stenosis    s/p aortic valve replacement using 25 mm Edwards Magna-ease pericardial valve by Dr. Laneta SimmersBartle during CABG 01/07/2016  . Tobacco abuse    Prior    Past Surgical History:  Procedure Laterality Date  . CARDIAC CATHETERIZATION N/A 12/29/2015   Procedure: Right/Left Heart Cath and Coronary Angiography;  Surgeon: Peter M SwazilandJordan, MD;  [facility];  Service: Cardiovascular;  Laterality: N/A;  . CHEST TUBE INSERTION Bilateral 01/25/2016   Procedure: INSERTION Bilateral PLEURAL DRAINAGE CATHETER;   Surgeon: Alleen BorneBryan K Bartle, MD;  Location: [facility] OR;  Service: Thoracic;  Laterality: Bilateral;  . CHEST TUBE INSERTION Bilateral 05/11/2016   Procedure: INSERTION PLEURAL DRAINAGE CATHETER ;  Surgeon: Alleen BorneBryan K Bartle, MD;  Location: [facility] OR;  Service: Thoracic;  Laterality: Bilateral;  . CORONARY ARTERY BYPASS GRAFT N/A 01/07/2016   Procedure: CORONARY ARTERY BYPASS GRAFTING (CABG), ON PUMP, TIMES FOUR, USING LEFT INTERNAL MAMMARY ARTERY, BILATERAL GREATER SAPHENOUS VEINS HARVESTED ENDOSCOPICALLY;  Surgeon: Alleen BorneBryan K Bartle, MD;  Location: [facility] OR;  Service: Open Heart Surgery;  Laterality: N/A;  LIMA to LAD, SVG to OM, SVG SEQUENTIALLY to PDA and PLB  . PLEURADESIS Bilateral 05/11/2016   Procedure: PLEURADESIS;  Surgeon: Alleen BorneBryan K Bartle, MD;  [facility];  Service: Thoracic;  Laterality: Bilateral;  . REMOVAL OF PLEURAL DRAINAGE CATHETER Left 03/17/2016   Procedure: REMOVAL OF PLEURAL DRAINAGE CATHETER;  Surgeon: Alleen BorneBryan K Bartle, MD;  [facility]: [facility] OR;  Service: Thoracic;  Laterality: Left;  . REMOVAL OF PLEURAL DRAINAGE CATHETER Bilateral 12/02/2016   Procedure: REMOVAL OF PLEURAL DRAINAGE CATHETER;  Surgeon: Alleen BorneBryan K Bartle, MD;  [facility]: [facility] OR;  Service: Thoracic;  Laterality: Bilateral;  . ruptured disc repair  2009 or 2010  . TALC PLEURODESIS Bilateral 07/06/2016   Procedure: BILATERAL DOXYCYCLINE PLEURADESIS;  Surgeon: Alleen BorneBryan K Bartle, MD;  [facility]: [facility] OR;  Service: Thoracic;  Laterality: Bilateral;  . TEE WITHOUT CARDIOVERSION N/A 01/07/2016   Procedure: TRANSESOPHAGEAL ECHOCARDIOGRAM (TEE);  Surgeon: Alleen BorneBryan K Bartle, MD;  [facility];  Service: Open Heart Surgery;  Laterality: N/A;  . THORACIC AORTIC ANEURYSM REPAIR N/A 01/07/2016   Procedure: CIRC ARREST AND REPLACEMENT OF ASCENDING THORACIC  ANEURYSM;  Surgeon: Alleen BorneBryan K Bartle, MD;  Location: [facility] OR;  Service: Open Heart Surgery;  Laterality: N/A;    Family History  Problem Relation Age of Onset  . Stroke Mother   . Diabetes Father   . Stroke Father     . Kidney disease Father   . Emphysema Father   . Kidney disease Sister   . Kidney disease Brother   . Lung disease Brother        Rare lung disease w/ inflammation  . Heart attack Neg Hx   . Rheumatologic disease Neg Hx     Social History   Social History  . Marital status: Divorced    Spouse name: N/A  . Number of children: 3  . Years of education: GED   Occupational History  . Retired heavy Arboriculturistequipment operator    Social History Main Topics  . Smoking status: Former Smoker    Packs/day: 2.00    Years: 46.00    Quit date: 11/07/2008  . Smokeless tobacco: Never Used     Comment: smoked 1-2 packs per day depending on drinking  . Alcohol use No     Comment: quit 2004 - 12 pack per day  for 30 years  . Drug use: No     Comment: quit Marijuana in 2014  . Sexual activity: Not Asked   Other Topics Concern  . None   Social History Narrative   Lives with son   Drinks one cup of coffee a day and one soda q2-3 days      [facility] (03/20/17):   Originally from [place], [place]. Previously worked as a Psychologist, forensic. No pets currently. Remote cockatiel exposure. No mold or hot tub exposure. Currently living alone.       Objective:   Physical Exam BP 120/80 (BP Location: Right Arm, Patient Position: Sitting, Cuff Size: Normal)   Pulse 71   Ht 5\' 9"  (1.753 m)   Wt 111 lb (50.3 kg)   SpO2 96%   BMI 16.39 kg/m  General:  Awake. Alert. No acute distress. Elderly, Caucasian male. Integument:  Warm & dry. No rash on exposed skin.  Extremities:  No cyanosis or clubbing.  Lymphatics:  No appreciated cervical or supraclavicular lymphadenoapthy. HEENT:  Moist mucus membranes. No oral ulcers. No scleral injection or icterus. Evidence of prior bleeding within right anterior nare. Cardiovascular:  Regular rate. No edema. No appreciable JVD.  Pulmonary:  Symmetrically decreased breath sounds. Symmetric chest wall expansion. No accessory muscle use on room air. Abdomen:  Soft. Normal bowel sounds. Nondistended. Grossly nontender. Musculoskeletal:  Normal bulk and tone. Hand grip strength 5/5 bilaterally. No joint deformity or effusion appreciated. Neurological:  CN 2-12 grossly in tact. No meningismus. Moving all 4 extremities equally. Symmetric brachioradialis deep tendon reflexes. Psychiatric:  Mood and affect congruent. Speech normal rhythm, rate & tone.   PFT 01/04/16: FVC 3.16 L (81%) FEV1 1.20 L (35%) FEV1/FVC 0.33 FEF 25-75 0.31 L (11%) no bronchodilator response TLC 6.17 L (90%) RV 102% ERV 141% DLCO corrected 28%  03/20/17:  Walked 3 laps / Baseline Sat 92% on RA / Nadir Sat 87% on RA (required 2 L/m to maintain Sat 90% with exertion)  IMAGING CXR PA/LAT 11/30/16 (personally reviewed by me): Bilateral pigtail pleural catheters in place. Right greater than left bilateral pleural effusions. Fluid within right fissure. There are living of the chest noted. Median sternotomy wires noted. Heart normal in size & mediastinum normal in contour.  CARDIAC TTE 05/07/16:  Mild LVH with EF 55-60%. No regional wall motion abnormalities. Unable to assess diastolic function due to atrial fibrillation. LA mildly dilated & RA normal in size. RV normal in size and function. Bioprosthetic aortic valve with trivial regurgitation and no stenosis. Aortic root normal in size. Mild mitral regurgitation without stenosis. No pulmonic stenosis. Trivial tricuspid regurgitation. Small pericardial effusion.  LHC 12/29/15:  Ost RCA to Prox RCA lesion, 90% stenosed.  Mid RCA lesion, 45% stenosed.  Ost LM lesion, 50% stenosed.  Ost LAD to Mid LAD lesion, 50% stenosed.  Dist LAD lesion, 70% stenosed.  Ost Cx to Prox Cx lesion, 70% stenosed.  RHC 12/29/15:  RA 15/10 (10)  RV 59/8   RV EDP 11  PA 64/34 (51)  PW 33  AO 113/85 (99)  MICROBIOLOGY Left Pleural Effusion Culture (01/17/16):  Negative Right Pleural Effusion Culture (01/16/16):  Negative  Tracheal  Aspirate Culture (12/27/15):  Normal Respiratory Flora     Assessment & Plan:  66 y.o. male with severe COPD & chronic hypoxic respiratory failure. Hemoptysis could certainly be due to intranasal bleeding given physical exam findings above. However, with his systemic anticoagulation and alternative etiology is possible. Holding on  bronchoscopy with airway inspection at this time. Further chest imaging is necessary to evaluate his pleural effusions as well as his underlying lung parenchyma with his hemoptysis. His oxygen requirement today appears to be 2 L/m with exertion but will require more intensive testing. I instructed the patient to contact my office if he had any breathing problems or questions before his next appointment.  1. Severe COPD:  Holding on inhaler therapy at this time. Checking full pulmonary function testing on her before next appointment. Screening for alpha-1 antitrypsin deficiency. Referring to pulmonary rehabilitation at [facility]. 2. Chronic hypoxic respiratory failure: Continuing on oxygen as previously prescribed at 2 L/m. Checking overnight oximetry on room air. Checking 6 to walk test with oxygen titration at next appointment. 3. Hemoptysis: Checking chest x-ray PA/LAT today. May require further CT imaging. 4. Follow-up: Return to clinic in 6 weeks or sooner if needed.  Donna Christen Jamison Neighbor, M.D. [facility] Pager:  [phone] After 3pm or if no response, call [phone] 10:39 AM 03/20/17

## 2017-03-20 NOTE — Telephone Encounter (Signed)
Papers were received via patient from [facility] needing an oxygen qualifying walk. Information was filled out and faxed to [facility] at [phone]. Confirmation fax was received. Nothing further is needed.

## 2017-03-20 NOTE — Addendum Note (Signed)
Addended by: Pamalee LeydenWIGGINS, Paxten Appelt J on: 03/20/2017 11:15 AM   Modules accepted: Orders

## 2017-03-22 ENCOUNTER — Ambulatory Visit: Payer: PPO | Admitting: Pulmonary Disease

## 2017-03-23 LAB — ALPHA-1 ANTITRYPSIN PHENOTYPE: A-1 Antitrypsin: 280 mg/dL — ABNORMAL HIGH (ref 83–199)

## 2017-03-31 ENCOUNTER — Other Ambulatory Visit: Payer: Self-pay | Admitting: Cardiology

## 2017-04-20 DIAGNOSIS — J449 Chronic obstructive pulmonary disease, unspecified: Secondary | ICD-10-CM | POA: Diagnosis not present

## 2017-04-20 DIAGNOSIS — R269 Unspecified abnormalities of gait and mobility: Secondary | ICD-10-CM | POA: Diagnosis not present

## 2017-05-12 ENCOUNTER — Ambulatory Visit: Payer: PPO

## 2017-05-12 ENCOUNTER — Ambulatory Visit: Payer: PPO | Admitting: Pulmonary Disease

## 2017-05-20 DIAGNOSIS — R269 Unspecified abnormalities of gait and mobility: Secondary | ICD-10-CM | POA: Diagnosis not present

## 2017-05-20 DIAGNOSIS — J449 Chronic obstructive pulmonary disease, unspecified: Secondary | ICD-10-CM | POA: Diagnosis not present

## 2017-05-29 ENCOUNTER — Other Ambulatory Visit: Payer: Self-pay | Admitting: Cardiovascular Disease

## 2017-06-06 ENCOUNTER — Other Ambulatory Visit: Payer: Self-pay | Admitting: Cardiovascular Disease

## 2017-06-06 DIAGNOSIS — Z952 Presence of prosthetic heart valve: Secondary | ICD-10-CM

## 2017-06-06 DIAGNOSIS — I25810 Atherosclerosis of coronary artery bypass graft(s) without angina pectoris: Secondary | ICD-10-CM

## 2017-06-06 DIAGNOSIS — I480 Paroxysmal atrial fibrillation: Secondary | ICD-10-CM

## 2017-06-06 MED ORDER — METOPROLOL TARTRATE 25 MG PO TABS: 12.5000 mg | ORAL_TABLET | Freq: Two times a day (BID) | ORAL | 2 refills | Status: DC

## 2017-06-06 NOTE — Addendum Note (Signed)
Addended by: Demetrios LollBARNARD, CATHY C on: 06/06/2017 04:35 PM   Modules accepted: Orders

## 2017-06-06 NOTE — Telephone Encounter (Signed)
Pt's medication was sent to pt's pharmacy as requested. Confirmation received.  °

## 2017-06-20 DIAGNOSIS — J449 Chronic obstructive pulmonary disease, unspecified: Secondary | ICD-10-CM | POA: Diagnosis not present

## 2017-06-20 DIAGNOSIS — R269 Unspecified abnormalities of gait and mobility: Secondary | ICD-10-CM | POA: Diagnosis not present

## 2017-07-21 DIAGNOSIS — R269 Unspecified abnormalities of gait and mobility: Secondary | ICD-10-CM | POA: Diagnosis not present

## 2017-07-21 DIAGNOSIS — J449 Chronic obstructive pulmonary disease, unspecified: Secondary | ICD-10-CM | POA: Diagnosis not present

## 2017-08-20 DIAGNOSIS — J449 Chronic obstructive pulmonary disease, unspecified: Secondary | ICD-10-CM | POA: Diagnosis not present

## 2017-08-20 DIAGNOSIS — R269 Unspecified abnormalities of gait and mobility: Secondary | ICD-10-CM | POA: Diagnosis not present

## 2017-08-22 ENCOUNTER — Other Ambulatory Visit: Payer: Self-pay | Admitting: Nurse Practitioner

## 2017-09-17 ENCOUNTER — Other Ambulatory Visit: Payer: Self-pay | Admitting: Nurse Practitioner

## 2017-09-20 DIAGNOSIS — R269 Unspecified abnormalities of gait and mobility: Secondary | ICD-10-CM | POA: Diagnosis not present

## 2017-09-20 DIAGNOSIS — J449 Chronic obstructive pulmonary disease, unspecified: Secondary | ICD-10-CM | POA: Diagnosis not present

## 2017-10-06 ENCOUNTER — Other Ambulatory Visit: Payer: Self-pay | Admitting: *Deleted

## 2017-10-06 MED ORDER — APIXABAN 5 MG PO TABS: 5.0000 mg | ORAL_TABLET | Freq: Two times a day (BID) | ORAL | 5 refills | Status: DC

## 2017-10-16 ENCOUNTER — Other Ambulatory Visit: Payer: Self-pay | Admitting: Nurse Practitioner

## 2017-10-20 DIAGNOSIS — R269 Unspecified abnormalities of gait and mobility: Secondary | ICD-10-CM | POA: Diagnosis not present

## 2017-10-20 DIAGNOSIS — J449 Chronic obstructive pulmonary disease, unspecified: Secondary | ICD-10-CM | POA: Diagnosis not present

## 2017-10-23 ENCOUNTER — Other Ambulatory Visit: Payer: Self-pay

## 2017-10-23 MED ORDER — ATORVASTATIN CALCIUM 80 MG PO TABS: 80.0000 mg | ORAL_TABLET | Freq: Every day | ORAL | 0 refills | Status: DC

## 2017-10-27 ENCOUNTER — Other Ambulatory Visit: Payer: Self-pay | Admitting: *Deleted

## 2017-10-27 MED ORDER — FUROSEMIDE 40 MG PO TABS: 40.0000 mg | ORAL_TABLET | Freq: Every day | ORAL | 1 refills | Status: DC

## 2017-11-20 DIAGNOSIS — R269 Unspecified abnormalities of gait and mobility: Secondary | ICD-10-CM | POA: Diagnosis not present

## 2017-11-20 DIAGNOSIS — J449 Chronic obstructive pulmonary disease, unspecified: Secondary | ICD-10-CM | POA: Diagnosis not present

## 2017-12-21 DIAGNOSIS — R269 Unspecified abnormalities of gait and mobility: Secondary | ICD-10-CM | POA: Diagnosis not present

## 2017-12-21 DIAGNOSIS — J449 Chronic obstructive pulmonary disease, unspecified: Secondary | ICD-10-CM | POA: Diagnosis not present

## 2017-12-27 ENCOUNTER — Other Ambulatory Visit: Payer: Self-pay | Admitting: *Deleted

## 2017-12-27 MED ORDER — ATORVASTATIN CALCIUM 80 MG PO TABS: 80.0000 mg | ORAL_TABLET | Freq: Every day | ORAL | 1 refills | Status: DC

## 2018-01-18 DIAGNOSIS — R269 Unspecified abnormalities of gait and mobility: Secondary | ICD-10-CM | POA: Diagnosis not present

## 2018-01-18 DIAGNOSIS — J449 Chronic obstructive pulmonary disease, unspecified: Secondary | ICD-10-CM | POA: Diagnosis not present

## 2018-02-18 DIAGNOSIS — R269 Unspecified abnormalities of gait and mobility: Secondary | ICD-10-CM | POA: Diagnosis not present

## 2018-02-18 DIAGNOSIS — J449 Chronic obstructive pulmonary disease, unspecified: Secondary | ICD-10-CM | POA: Diagnosis not present

## 2018-03-02 IMAGING — CR DG CHEST 1V PORT
1 series · 1 of 1 positions shown · non-contrast
Comparison: CT 01/11/2016.  Chest x-ray 01/11/2016 .

CLINICAL DATA: CABG.

EXAM:
PORTABLE CHEST 1 VIEW

[AP]
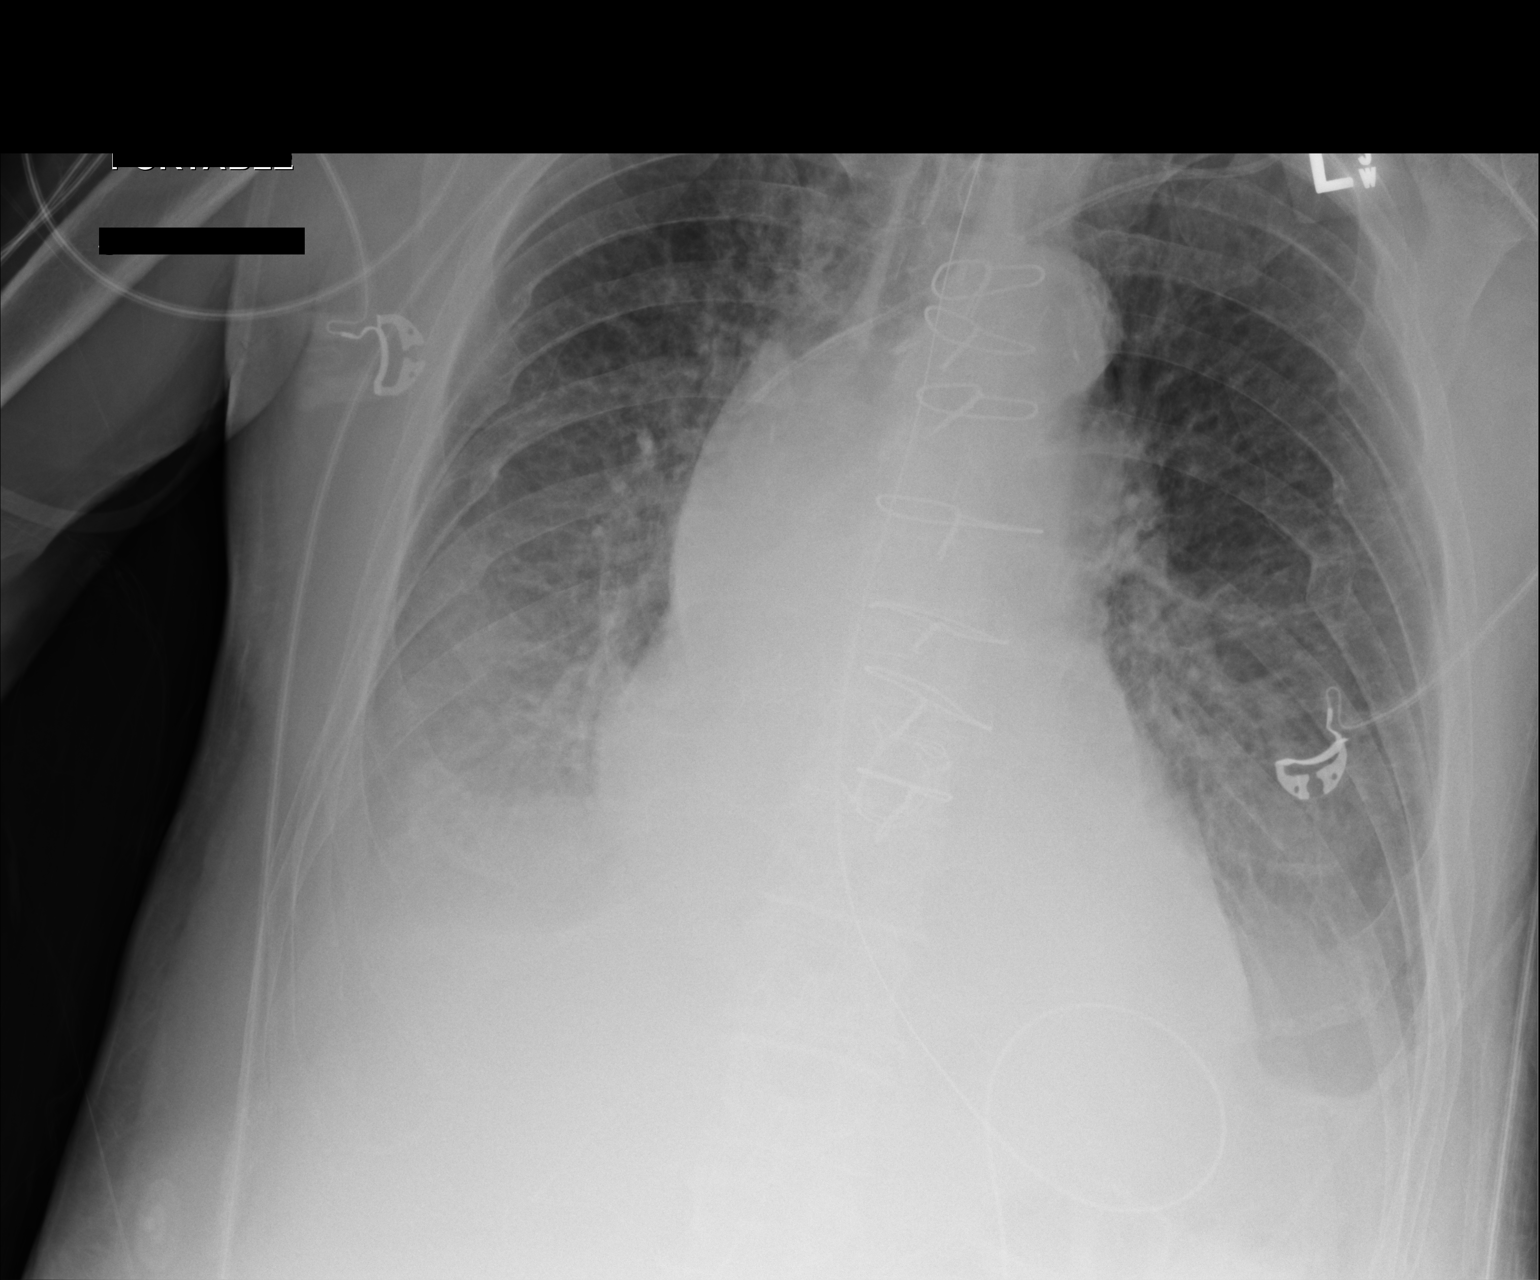

[1 of 1 positions shown; findings below may reference images not displayed]

FINDINGS: Endotracheal tube, NG tube, left subclavian line in stable position.
Prior CABG. Persistent mediastinal prominence and cardiomegaly. Mild
increased pulmonary venous congestion. Bilateral pulmonary
infiltrates noted on today's exam consistent with pulmonary edema.
Bilateral pleural effusions again noted. No pneumothorax.
IMPRESSION: 1. Lines and tubes in stable position.
2. Prior CABG. Persistent mediastinal prominence and cardiomegaly,
reference made to prior CT report. Interim development of bilateral
pulmonary edema. Persistent bilateral pleural effusions.

## 2018-03-03 IMAGING — CR DG CHEST 1V PORT
1 series · 1 of 1 positions shown · non-contrast
Comparison: Portable chest x-ray dated January 12, 2016

CLINICAL DATA: Status post CABG and thoracic aortic aneurysm repair
on January 07, 2016

EXAM:
PORTABLE CHEST 1 VIEW

[AP]
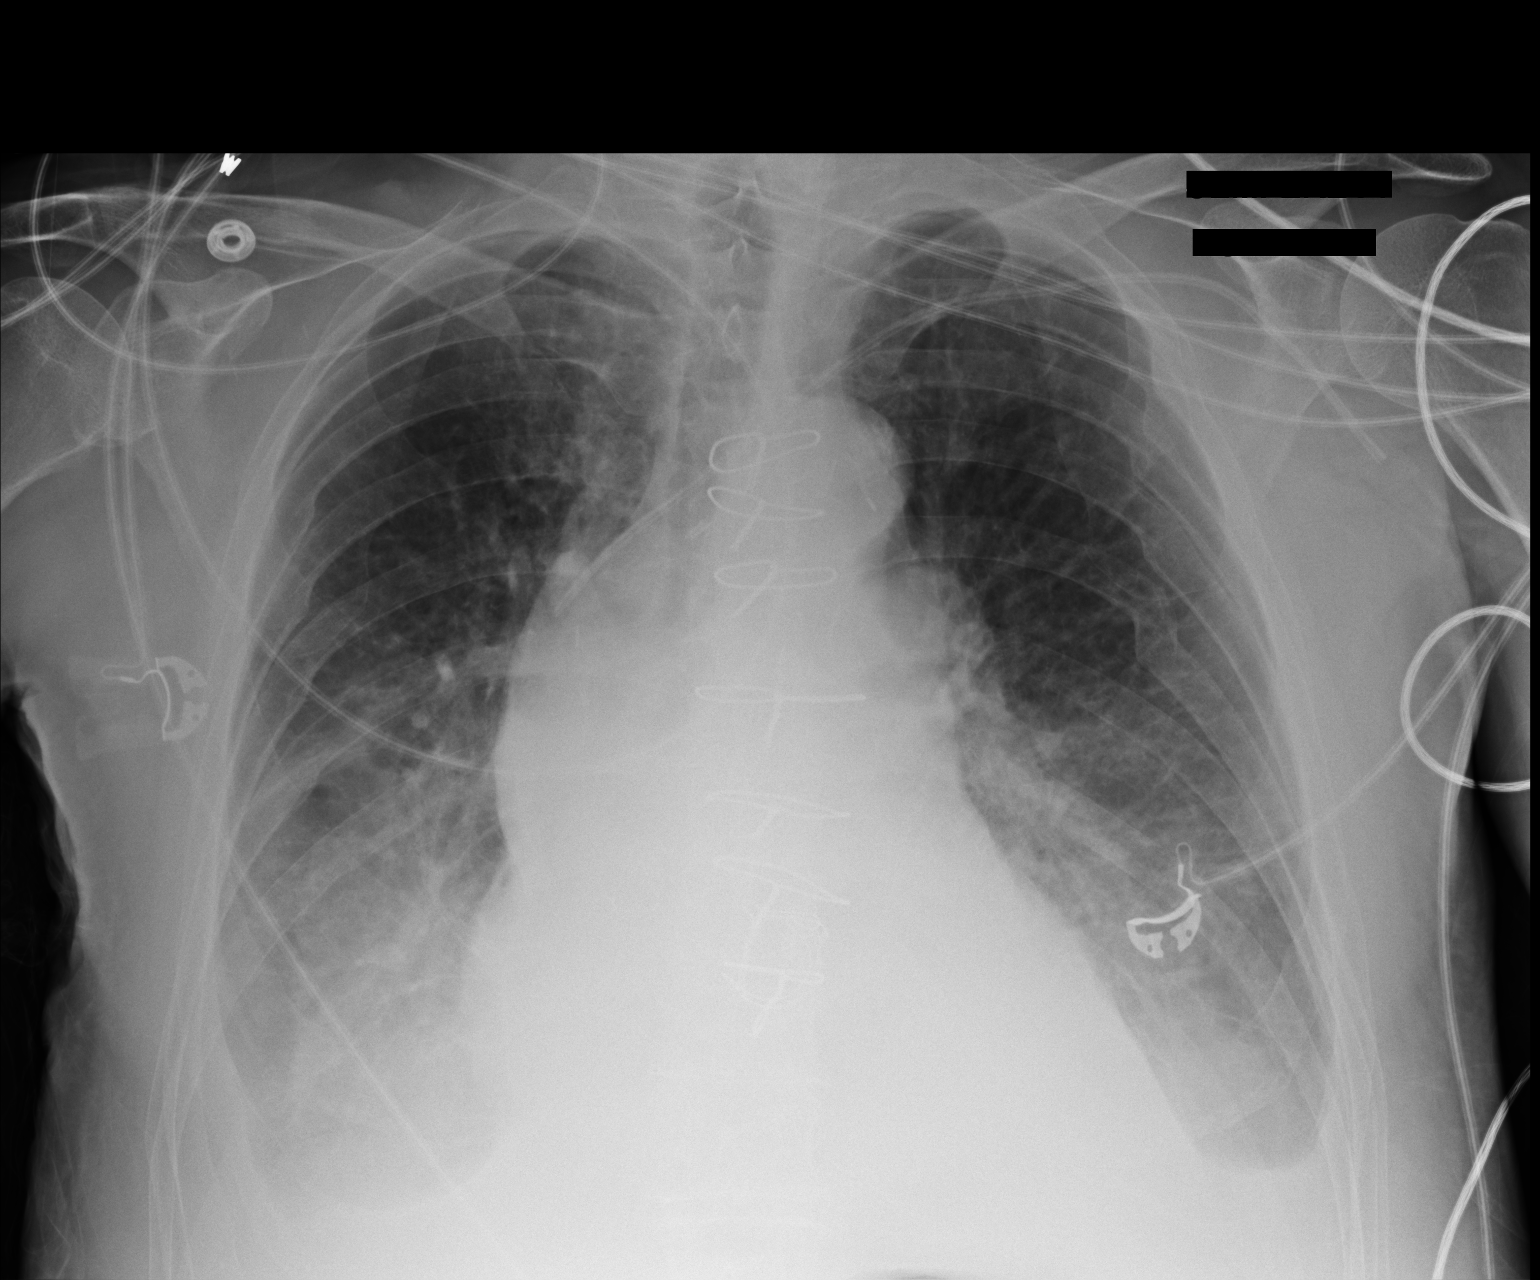

[1 of 1 positions shown; findings below may reference images not displayed]

FINDINGS: Interval extubation of the trachea and esophagus. The lungs are
well-expanded. There are small bilateral pleural effusions which are
slightly less conspicuous today. The pulmonary interstitial markings
are also less engorged. The pulmonary vascularity is more distinct.
The cardiac silhouette remains enlarged. The sternal wires are
intact. A prosthetic aortic valve ring is visible. The left
subclavian venous catheter tip projects over the proximal SVC. O
bilateral posterior rib fractures are observed.
IMPRESSION: Further interval decrease in pulmonary interstitial edema and
bilateral pleural effusions consistent with improving CHF and
resolving postsurgical changes. Underlying COPD.

## 2018-03-05 IMAGING — CR DG CHEST 1V PORT
1 series · 1 of 1 positions shown · non-contrast
Comparison: 01/14/2016.

CLINICAL DATA: Aortic valve replacement.

EXAM:
PORTABLE CHEST 1 VIEW

[AP]
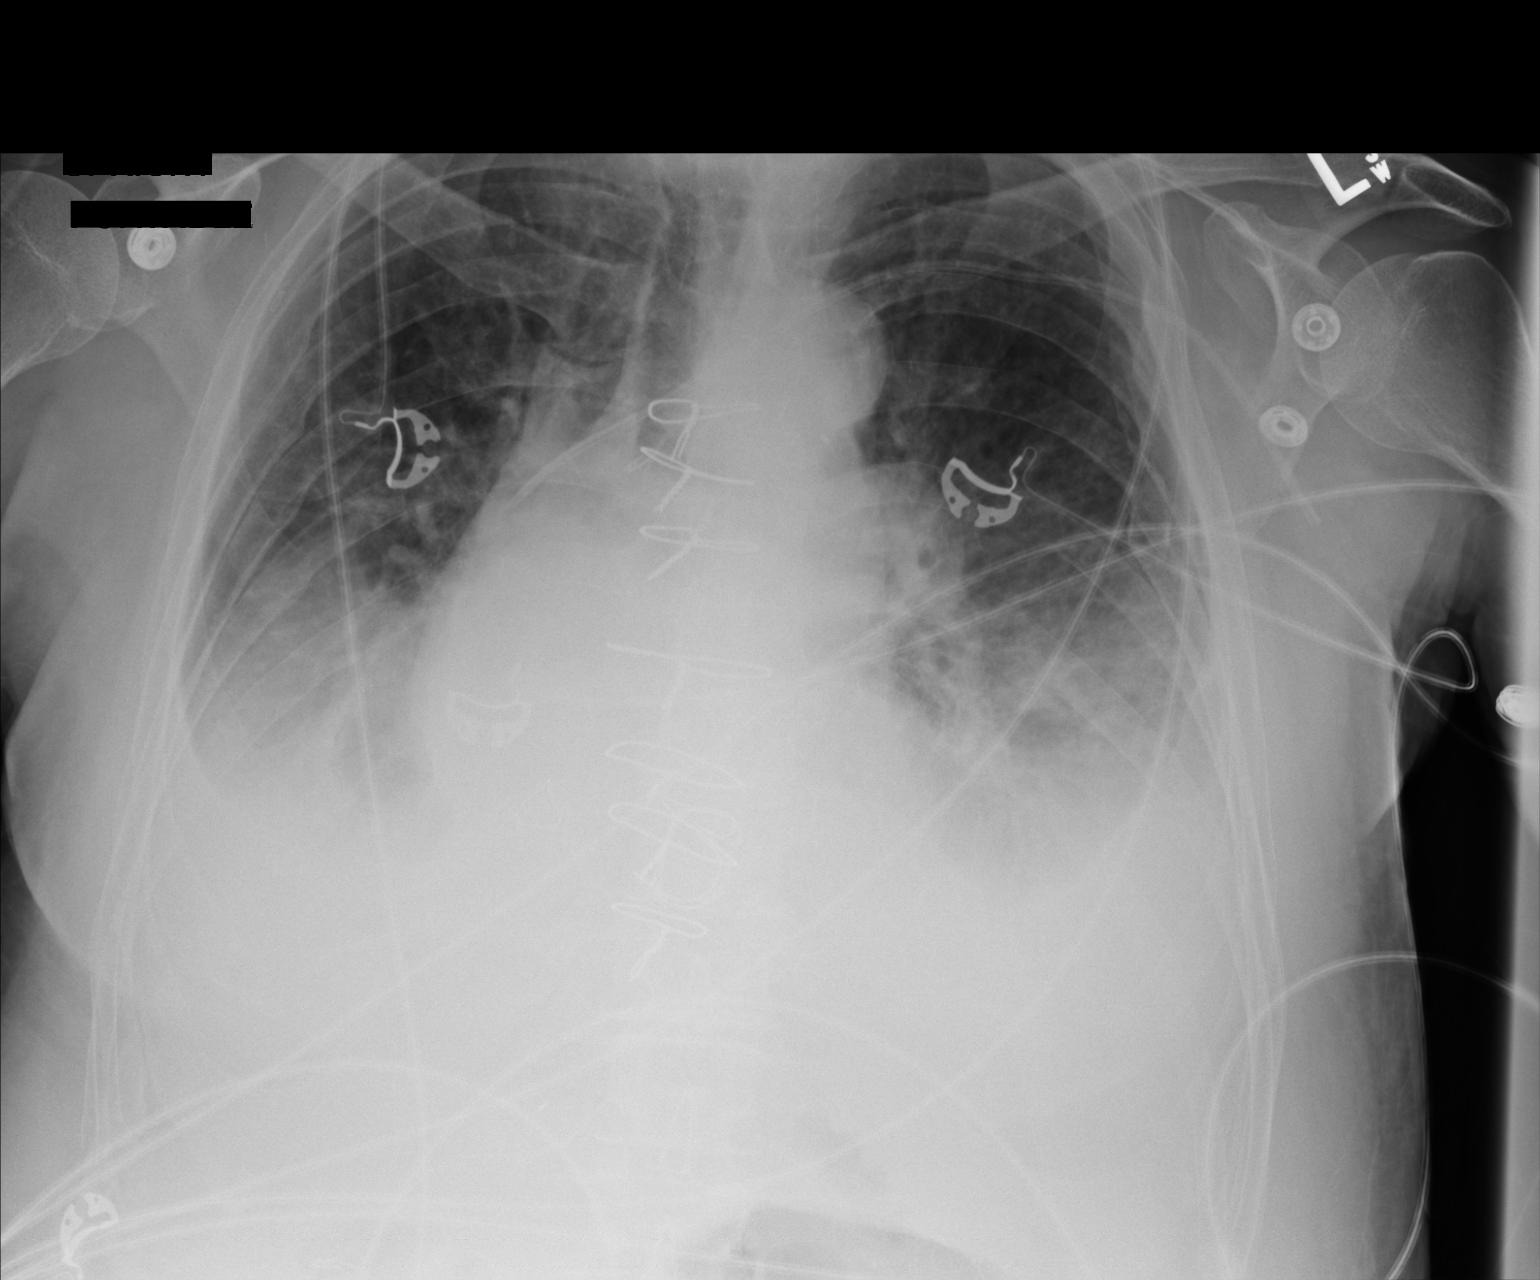

[1 of 1 positions shown; findings below may reference images not displayed]

FINDINGS: Left PICC line in stable position. Prior aortic valve replacement.
Cardiomegaly with progressive bilateral pulmonary infiltrates and
pleural effusions consistent congestive heart failure. Low lung
volumes with basilar atelectasis. No pneumothorax .
IMPRESSION: 1. Left PICC line stable position.
2. Prior aortic valve replacement. Cardiomegaly with progressive
bilateral pulmonary infiltrates and pleural effusions consistent
congestive heart failure.

## 2018-03-06 ENCOUNTER — Other Ambulatory Visit: Payer: Self-pay | Admitting: Cardiovascular Disease

## 2018-03-06 DIAGNOSIS — I25810 Atherosclerosis of coronary artery bypass graft(s) without angina pectoris: Secondary | ICD-10-CM

## 2018-03-06 DIAGNOSIS — I480 Paroxysmal atrial fibrillation: Secondary | ICD-10-CM

## 2018-03-06 DIAGNOSIS — Z952 Presence of prosthetic heart valve: Secondary | ICD-10-CM

## 2018-03-06 IMAGING — US US THORACENTESIS ASP PLEURAL SPACE W/IMG GUIDE
1 series · 5 of 5 positions shown · non-contrast
Comparison: none

INDICATION: 65-year-old male with bilateral layering pleural effusions and
dyspnea requiring BiPAP

[Series 1: us thoracentesis asp pleural space w/img guide · 0.26mm/px · 5 of 5 slices shown]
[im 1/5]
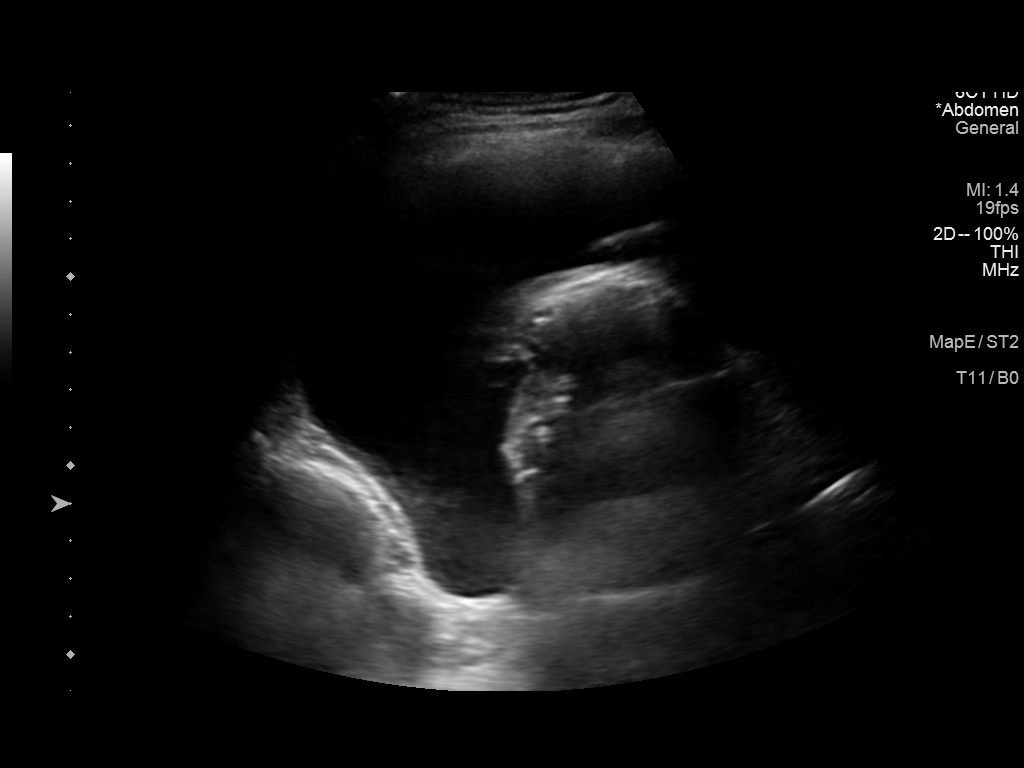
[im 2/5]
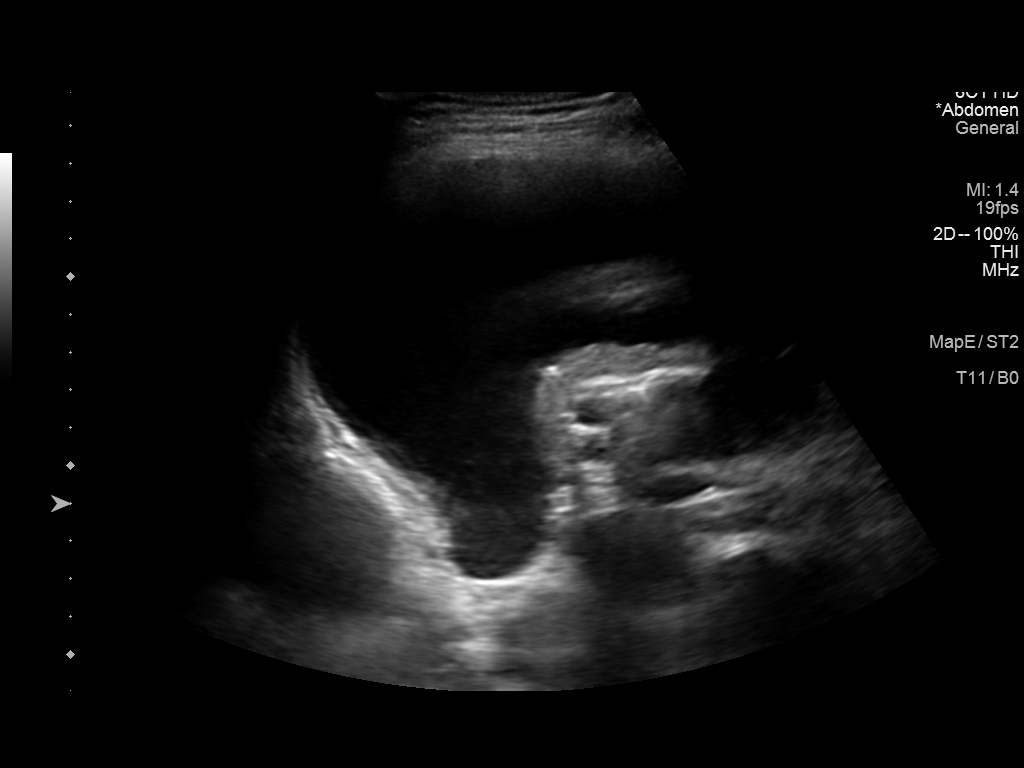
[im 3/5]
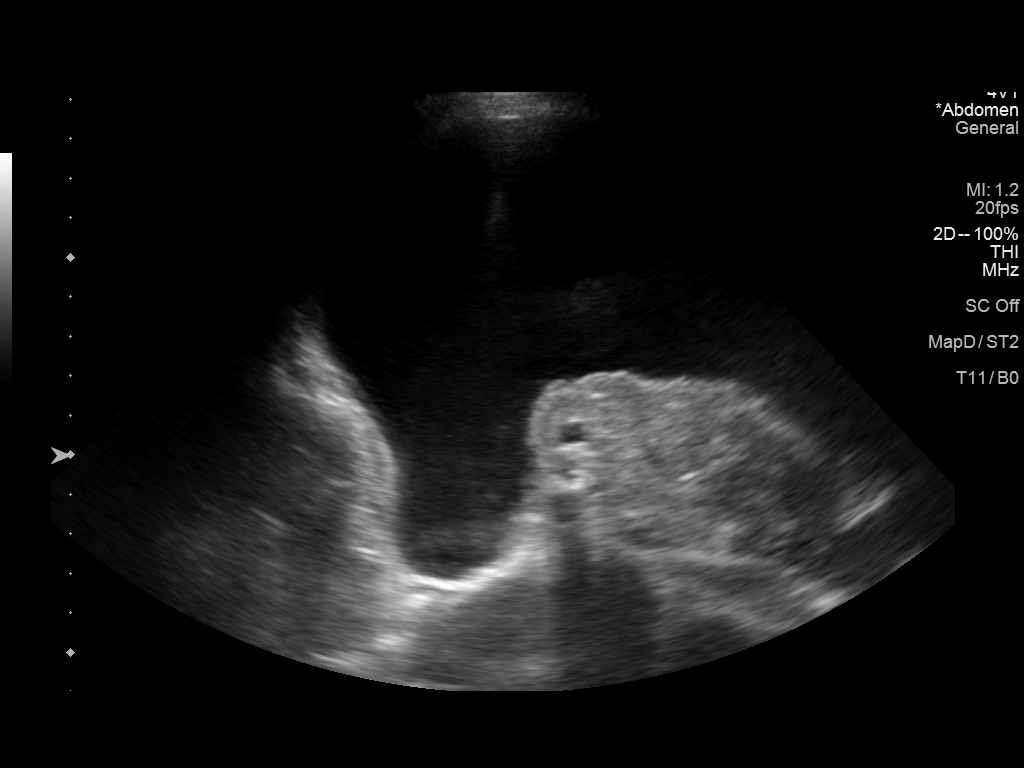
[im 4/5]
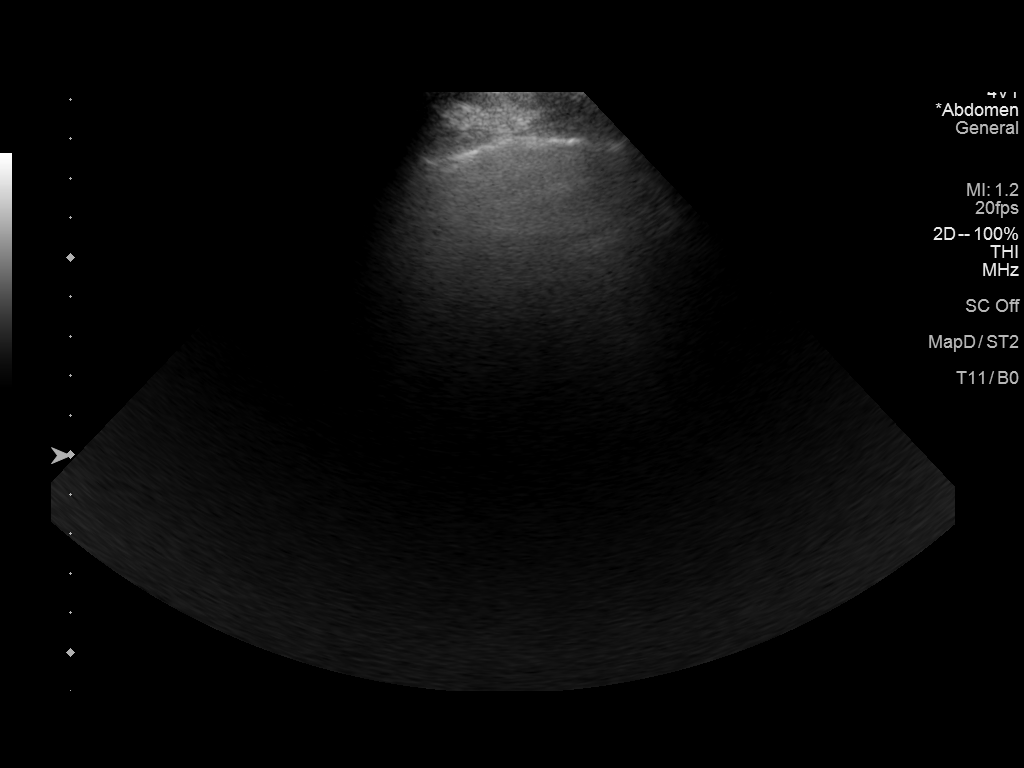
[im 5/5]
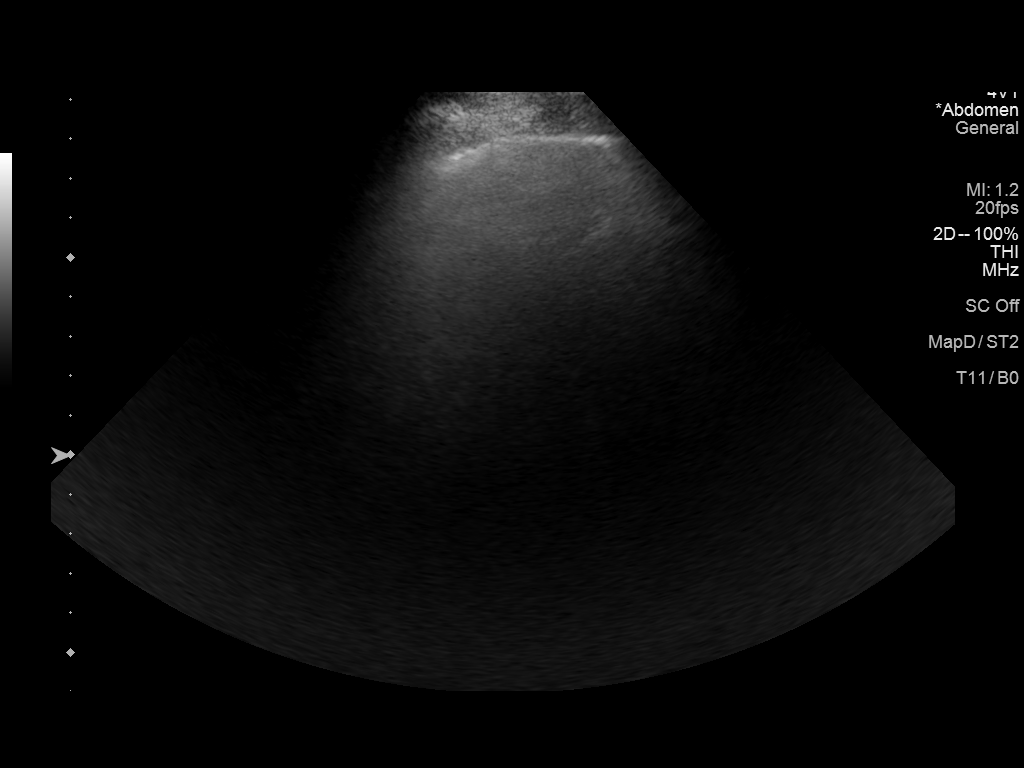

[5 of 5 positions shown; findings below may reference images not displayed]

EXAM:
ULTRASOUND GUIDED RIGHT THORACENTESIS

MEDICATIONS:
None.

COMPLICATIONS:
None immediate.

PROCEDURE:
An ultrasound guided thoracentesis was thoroughly discussed with the
patient and questions answered. The benefits, risks, alternatives
and complications were also discussed. The patient understands and
wishes to proceed with the procedure. Written consent was obtained.

Ultrasound was performed to localize and mark an adequate pocket of
fluid in the right chest. The area was then prepped and draped in
the normal sterile fashion. 1% Lidocaine was used for local
anesthesia. Under ultrasound guidance a 6 Fr Safe-T-Centesis
catheter was introduced. Thoracentesis was performed. The catheter
was removed and a dressing applied.
FINDINGS: A total of approximately 2000 mL of bloody pleural fluid was
removed. Samples were sent to the laboratory as requested by the
clinical team.
IMPRESSION: Successful ultrasound guided right thoracentesis yielding 1.4 L of
bloody pleural fluid.

## 2018-03-06 IMAGING — CR DG CHEST 1V PORT
1 series · 1 of 1 positions shown · non-contrast
Comparison: 01/15/2016

CLINICAL DATA: Shortness of breath

EXAM:
PORTABLE CHEST 1 VIEW

[AP]
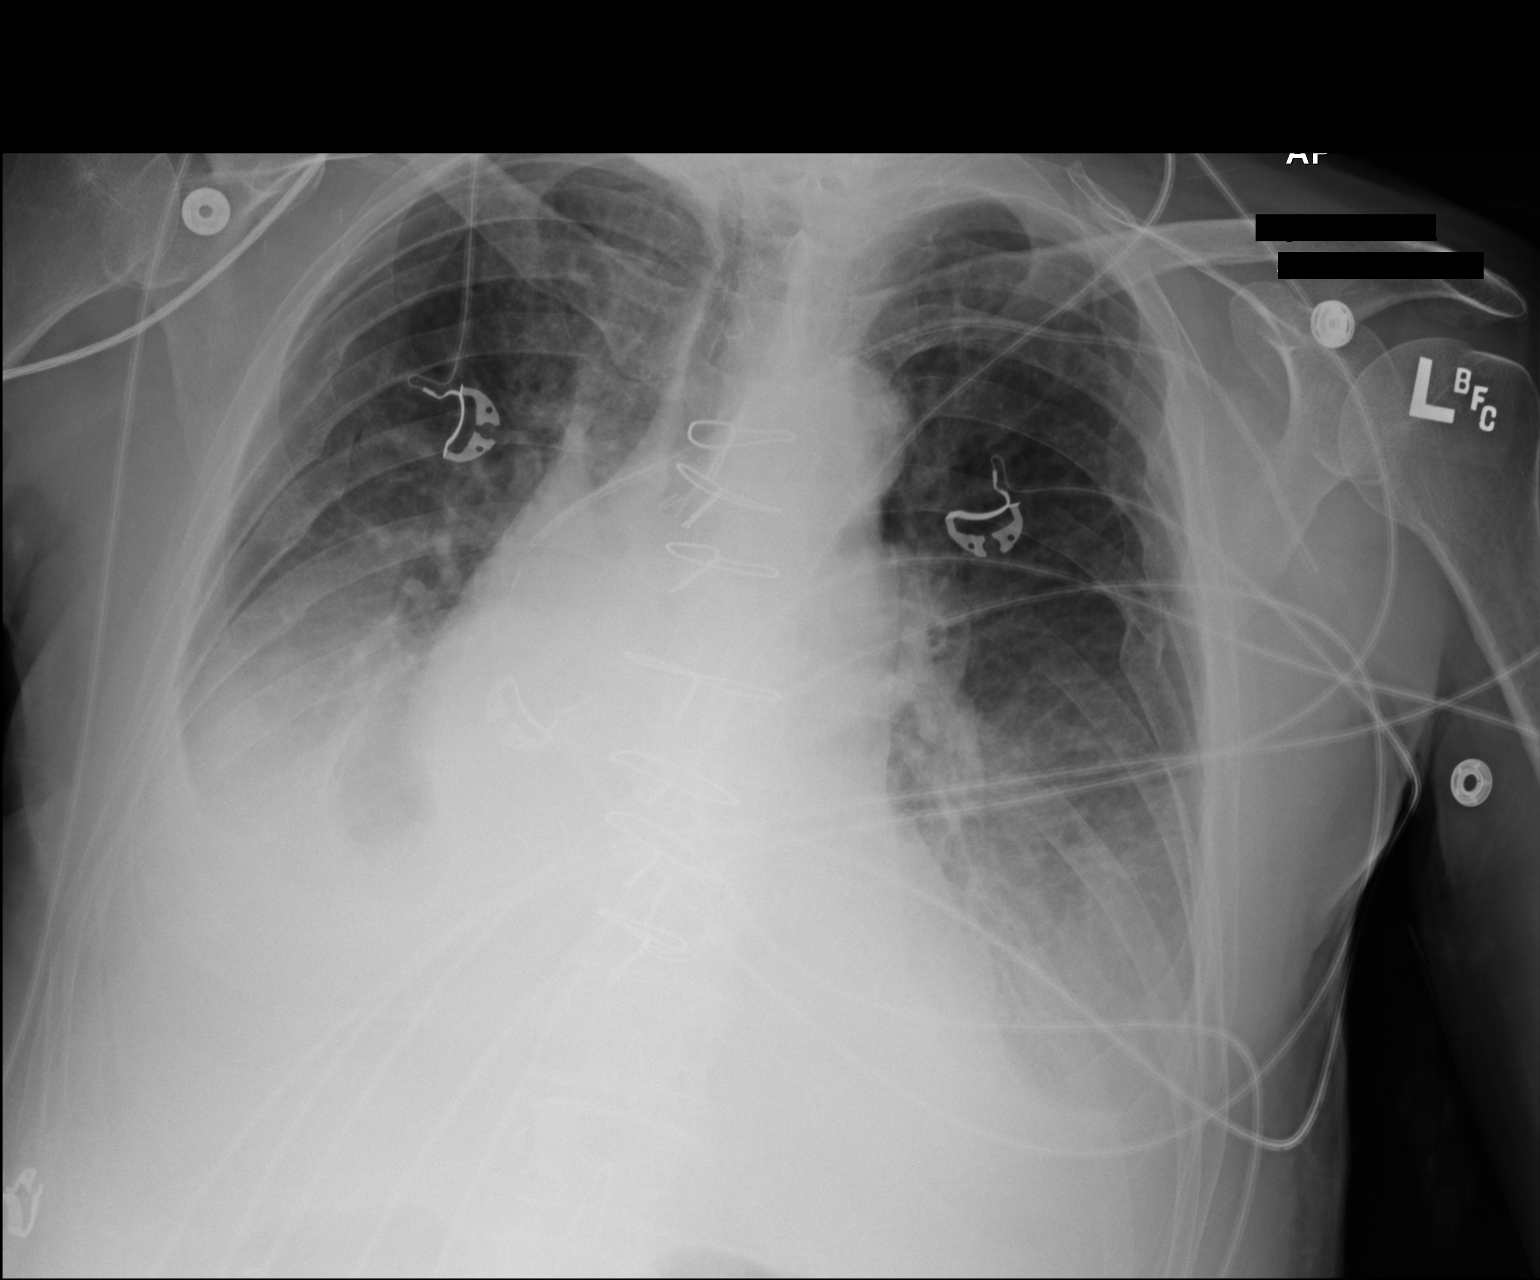

[1 of 1 positions shown; findings below may reference images not displayed]

FINDINGS: Cardiomegaly with pulmonary vascular congestion and suspected mild
interstitial edema.

Moderate bilateral pleural effusions, right greater than left.
Associated bilateral lower lobe opacities, likely atelectasis.

Prosthetic valve.  Median sternotomy.

Left subclavian venous catheter terminates in the lower SVC.

Old bilateral rib fracture deformities.
IMPRESSION: Cardiomegaly with mild interstitial edema.

Moderate bilateral pleural effusions, right greater than left.

Associated bilateral lower lobe opacities, likely atelectasis.

## 2018-03-09 ENCOUNTER — Telehealth: Payer: Self-pay | Admitting: Cardiovascular Disease

## 2018-03-09 DIAGNOSIS — I480 Paroxysmal atrial fibrillation: Secondary | ICD-10-CM

## 2018-03-09 DIAGNOSIS — I25810 Atherosclerosis of coronary artery bypass graft(s) without angina pectoris: Secondary | ICD-10-CM

## 2018-03-09 DIAGNOSIS — Z952 Presence of prosthetic heart valve: Secondary | ICD-10-CM

## 2018-03-09 NOTE — Telephone Encounter (Signed)
Spoke with pt's sister, [name]. She has concerns with medication refills and management of pt's care. She states he is very ill with COPD and coming into the office is very strenuous for him. She states his pulmonologist said "there is nothing more we can do for him."  I have mentioned to [name] perhaps if that were the case he might qualify for palliative care to help manage medications and healthcare. She would like for Dr Clifton James or his RN to give her a call to discuss potential options for him and managing his care given his chronic illness.

## 2018-03-09 NOTE — Telephone Encounter (Signed)
New message   Patient's sister calling to confirm that Dr Clifton James does want labs drawn or testing  this month. Betsey Holiday states her brother does not need an appointment this year unless her brother gets sick. She is requesting authorization on medications to be refilled without an office visit.  Please advise

## 2018-03-12 MED ORDER — FUROSEMIDE 40 MG PO TABS: 40.0000 mg | ORAL_TABLET | Freq: Every day | ORAL | 6 refills | Status: AC

## 2018-03-12 MED ORDER — ATORVASTATIN CALCIUM 80 MG PO TABS: 80.0000 mg | ORAL_TABLET | Freq: Every day | ORAL | 6 refills | Status: AC

## 2018-03-12 MED ORDER — APIXABAN 5 MG PO TABS: 5.0000 mg | ORAL_TABLET | Freq: Two times a day (BID) | ORAL | 6 refills | Status: DC

## 2018-03-12 MED ORDER — NITROGLYCERIN 0.4 MG SL SUBL: 0.4000 mg | SUBLINGUAL_TABLET | SUBLINGUAL | 6 refills | Status: AC | PRN

## 2018-03-12 MED ORDER — METOPROLOL TARTRATE 25 MG PO TABS: 12.5000 mg | ORAL_TABLET | Freq: Two times a day (BID) | ORAL | 6 refills | Status: DC

## 2018-03-12 MED ORDER — POTASSIUM CHLORIDE CRYS ER 20 MEQ PO TBCR: 20.0000 meq | EXTENDED_RELEASE_TABLET | Freq: Two times a day (BID) | ORAL | 6 refills | Status: AC

## 2018-03-12 NOTE — Telephone Encounter (Signed)
Left message to call back  

## 2018-03-12 NOTE — Telephone Encounter (Signed)
If he is unable to come in, we can continue to refill his meds. I am sorry to hear that he is not doing well. I would agree with having a visit with primary care to discuss end of life goals/palliation. Craig [name]

## 2018-03-12 NOTE — Telephone Encounter (Signed)
I spoke with pt's sister and gave her information from Dr Clifton James. Will send refills to [facility] in [place].  Sister states she will contact primary care regarding end of life goals/care.

## 2018-03-26 ENCOUNTER — Inpatient Hospital Stay (HOSPITAL_COMMUNITY)
Admission: EM | Admit: 2018-03-26 | Discharge: 2018-03-31 | DRG: 291 | Disposition: A | Discharge status: Home Health | Payer: PPO | Attending: Internal Medicine | Admitting: Internal Medicine

## 2018-03-26 ENCOUNTER — Other Ambulatory Visit: Payer: Self-pay

## 2018-03-26 ENCOUNTER — Emergency Department (HOSPITAL_COMMUNITY): Payer: PPO

## 2018-03-26 DIAGNOSIS — M25511 Pain in right shoulder: Secondary | ICD-10-CM

## 2018-03-26 DIAGNOSIS — Z952 Presence of prosthetic heart valve: Secondary | ICD-10-CM

## 2018-03-26 DIAGNOSIS — Z7189 Other specified counseling: Secondary | ICD-10-CM | POA: Diagnosis not present

## 2018-03-26 DIAGNOSIS — J449 Chronic obstructive pulmonary disease, unspecified: Secondary | ICD-10-CM | POA: Diagnosis not present

## 2018-03-26 DIAGNOSIS — E785 Hyperlipidemia, unspecified: Secondary | ICD-10-CM | POA: Diagnosis present

## 2018-03-26 DIAGNOSIS — Z951 Presence of aortocoronary bypass graft: Secondary | ICD-10-CM | POA: Diagnosis not present

## 2018-03-26 DIAGNOSIS — J441 Chronic obstructive pulmonary disease with (acute) exacerbation: Secondary | ICD-10-CM | POA: Diagnosis present

## 2018-03-26 DIAGNOSIS — I2510 Atherosclerotic heart disease of native coronary artery without angina pectoris: Secondary | ICD-10-CM | POA: Diagnosis present

## 2018-03-26 DIAGNOSIS — I480 Paroxysmal atrial fibrillation: Secondary | ICD-10-CM | POA: Diagnosis present

## 2018-03-26 DIAGNOSIS — J9601 Acute respiratory failure with hypoxia: Secondary | ICD-10-CM | POA: Diagnosis not present

## 2018-03-26 DIAGNOSIS — I4891 Unspecified atrial fibrillation: Secondary | ICD-10-CM | POA: Diagnosis not present

## 2018-03-26 DIAGNOSIS — N179 Acute kidney failure, unspecified: Secondary | ICD-10-CM | POA: Diagnosis not present

## 2018-03-26 DIAGNOSIS — Z823 Family history of stroke: Secondary | ICD-10-CM

## 2018-03-26 DIAGNOSIS — J90 Pleural effusion, not elsewhere classified: Secondary | ICD-10-CM | POA: Diagnosis present

## 2018-03-26 DIAGNOSIS — I248 Other forms of acute ischemic heart disease: Secondary | ICD-10-CM | POA: Diagnosis not present

## 2018-03-26 DIAGNOSIS — I110 Hypertensive heart disease with heart failure: Secondary | ICD-10-CM | POA: Diagnosis not present

## 2018-03-26 DIAGNOSIS — J189 Pneumonia, unspecified organism: Secondary | ICD-10-CM

## 2018-03-26 DIAGNOSIS — Z515 Encounter for palliative care: Secondary | ICD-10-CM | POA: Diagnosis not present

## 2018-03-26 DIAGNOSIS — Z8639 Personal history of other endocrine, nutritional and metabolic disease: Secondary | ICD-10-CM

## 2018-03-26 DIAGNOSIS — R7989 Other specified abnormal findings of blood chemistry: Secondary | ICD-10-CM | POA: Diagnosis not present

## 2018-03-26 DIAGNOSIS — Z87891 Personal history of nicotine dependence: Secondary | ICD-10-CM

## 2018-03-26 DIAGNOSIS — Z7901 Long term (current) use of anticoagulants: Secondary | ICD-10-CM

## 2018-03-26 DIAGNOSIS — I5043 Acute on chronic combined systolic (congestive) and diastolic (congestive) heart failure: Secondary | ICD-10-CM | POA: Diagnosis not present

## 2018-03-26 DIAGNOSIS — Z841 Family history of disorders of kidney and ureter: Secondary | ICD-10-CM

## 2018-03-26 DIAGNOSIS — I252 Old myocardial infarction: Secondary | ICD-10-CM | POA: Diagnosis not present

## 2018-03-26 DIAGNOSIS — J9621 Acute and chronic respiratory failure with hypoxia: Secondary | ICD-10-CM | POA: Diagnosis not present

## 2018-03-26 DIAGNOSIS — Z825 Family history of asthma and other chronic lower respiratory diseases: Secondary | ICD-10-CM

## 2018-03-26 DIAGNOSIS — I340 Nonrheumatic mitral (valve) insufficiency: Secondary | ICD-10-CM | POA: Diagnosis not present

## 2018-03-26 DIAGNOSIS — Z7982 Long term (current) use of aspirin: Secondary | ICD-10-CM

## 2018-03-26 DIAGNOSIS — Z9981 Dependence on supplemental oxygen: Secondary | ICD-10-CM | POA: Diagnosis not present

## 2018-03-26 DIAGNOSIS — R748 Abnormal levels of other serum enzymes: Secondary | ICD-10-CM | POA: Diagnosis not present

## 2018-03-26 DIAGNOSIS — Z833 Family history of diabetes mellitus: Secondary | ICD-10-CM | POA: Diagnosis not present

## 2018-03-26 DIAGNOSIS — I5041 Acute combined systolic (congestive) and diastolic (congestive) heart failure: Secondary | ICD-10-CM | POA: Diagnosis present

## 2018-03-26 DIAGNOSIS — T502X5A Adverse effect of carbonic-anhydrase inhibitors, benzothiadiazides and other diuretics, initial encounter: Secondary | ICD-10-CM | POA: Diagnosis not present

## 2018-03-26 DIAGNOSIS — Z8679 Personal history of other diseases of the circulatory system: Secondary | ICD-10-CM

## 2018-03-26 DIAGNOSIS — R0602 Shortness of breath: Secondary | ICD-10-CM | POA: Diagnosis not present

## 2018-03-26 DIAGNOSIS — R778 Other specified abnormalities of plasma proteins: Secondary | ICD-10-CM

## 2018-03-26 DIAGNOSIS — J181 Lobar pneumonia, unspecified organism: Secondary | ICD-10-CM

## 2018-03-26 LAB — BASIC METABOLIC PANEL
Anion gap: 7 (ref 5–15)
BUN: 21 mg/dL — AB (ref 6–20)
CALCIUM: 9 mg/dL (ref 8.9–10.3)
CHLORIDE: 106 mmol/L (ref 101–111)
CO2: 28 mmol/L (ref 22–32)
CREATININE: 1.01 mg/dL (ref 0.61–1.24)
GFR calc Af Amer: >60 mL/min (ref >60)
Glucose, Bld: 137 mg/dL — ABNORMAL HIGH (ref 65–99)
Potassium: 4.2 mmol/L (ref 3.5–5.1)
SODIUM: 141 mmol/L (ref 135–145)

## 2018-03-26 LAB — I-STAT TROPONIN, ED: TROPONIN I, POC: 0.16 ng/mL — AB (ref 0.00–0.08)

## 2018-03-26 LAB — URINALYSIS, ROUTINE W REFLEX MICROSCOPIC
BILIRUBIN URINE: NEGATIVE
GLUCOSE, UA: NEGATIVE mg/dL
Ketones, ur: NEGATIVE mg/dL
Leukocytes, UA: NEGATIVE
NITRITE: NEGATIVE
PH: 5 (ref 5.0–8.0)
Protein, ur: NEGATIVE mg/dL
Specific Gravity, Urine: 1.005 (ref 1.005–1.030)

## 2018-03-26 LAB — BRAIN NATRIURETIC PEPTIDE: B NATRIURETIC PEPTIDE 5: 1084.8 pg/mL — AB (ref 0.0–100.0)

## 2018-03-26 LAB — TROPONIN I
Troponin I: 0.15 ng/mL (ref <0.03)
Troponin I: 0.18 ng/mL (ref <0.03)

## 2018-03-26 LAB — CBC
HCT: 37.9 % — ABNORMAL LOW (ref 39.0–52.0)
Hemoglobin: 11.7 g/dL — ABNORMAL LOW (ref 13.0–17.0)
MCH: 31.6 pg (ref 26.0–34.0)
MCHC: 30.9 g/dL (ref 30.0–36.0)
MCV: 102.4 fL — ABNORMAL HIGH (ref 78.0–100.0)
PLATELETS: 137 10*3/uL — AB (ref 150–400)
RBC: 3.7 MIL/uL — ABNORMAL LOW (ref 4.22–5.81)
RDW: 15.2 % (ref 11.5–15.5)
WBC: 4.6 10*3/uL (ref 4.0–10.5)

## 2018-03-26 MED ORDER — ONDANSETRON HCL 4 MG PO TABS: 4.0000 mg | ORAL_TABLET | Freq: Four times a day (QID) | ORAL | Status: DC | PRN

## 2018-03-26 MED ORDER — GUAIFENESIN ER 600 MG PO TB12: 600.0000 mg | ORAL_TABLET | Freq: Two times a day (BID) | ORAL | Status: DC | PRN

## 2018-03-26 MED ORDER — NITROGLYCERIN 0.4 MG SL SUBL: 0.4000 mg | SUBLINGUAL_TABLET | SUBLINGUAL | Status: DC | PRN

## 2018-03-26 MED ORDER — DOXYCYCLINE HYCLATE 100 MG PO TABS: 100.0000 mg | ORAL_TABLET | Freq: Two times a day (BID) | ORAL | Status: DC

## 2018-03-26 MED ORDER — METOPROLOL TARTRATE 25 MG PO TABS: 12.5000 mg | ORAL_TABLET | Freq: Two times a day (BID) | ORAL | Status: DC

## 2018-03-26 MED ORDER — ALBUTEROL SULFATE (2.5 MG/3ML) 0.083% IN NEBU: 2.5000 mg | INHALATION_SOLUTION | RESPIRATORY_TRACT | Status: DC | PRN

## 2018-03-26 MED ORDER — SODIUM CHLORIDE 0.9 % IV BOLUS
500.0000 mL | Freq: Once | INTRAVENOUS | Status: AC
  Administered 2018-03-26: 500 mL via INTRAVENOUS

## 2018-03-26 MED ORDER — SENNOSIDES-DOCUSATE SODIUM 8.6-50 MG PO TABS: 1.0000 | ORAL_TABLET | Freq: Every evening | ORAL | Status: DC | PRN

## 2018-03-26 MED ORDER — ACETAMINOPHEN 325 MG PO TABS: 650.0000 mg | ORAL_TABLET | Freq: Four times a day (QID) | ORAL | Status: DC | PRN

## 2018-03-26 MED ORDER — IPRATROPIUM-ALBUTEROL 0.5-2.5 (3) MG/3ML IN SOLN
3.0000 mL | Freq: Four times a day (QID) | RESPIRATORY_TRACT | Status: DC
  Administered 2018-03-26: 3 mL via RESPIRATORY_TRACT
  Filled 2018-03-26 (×2): qty 3

## 2018-03-26 MED ORDER — PNEUMOCOCCAL VAC POLYVALENT 25 MCG/0.5ML IJ INJ
0.5000 mL | INJECTION | INTRAMUSCULAR | Status: DC
  Filled 2018-03-26: qty 0.5

## 2018-03-26 MED ORDER — LEVOFLOXACIN 500 MG PO TABS
500.0000 mg | ORAL_TABLET | Freq: Once | ORAL | Status: AC
  Administered 2018-03-26: 500 mg via ORAL
  Filled 2018-03-26 (×3): qty 1

## 2018-03-26 MED ORDER — ASPIRIN 81 MG PO CHEW
324.0000 mg | CHEWABLE_TABLET | Freq: Once | ORAL | Status: DC
  Filled 2018-03-26: qty 4

## 2018-03-26 MED ORDER — FUROSEMIDE 10 MG/ML IJ SOLN
40.0000 mg | Freq: Once | INTRAMUSCULAR | Status: AC
  Administered 2018-03-26: 40 mg via INTRAVENOUS
  Filled 2018-03-26: qty 4

## 2018-03-26 MED ORDER — FUROSEMIDE 20 MG PO TABS: 40.0000 mg | ORAL_TABLET | ORAL | Status: DC

## 2018-03-26 MED ORDER — ATORVASTATIN CALCIUM 80 MG PO TABS
80.0000 mg | ORAL_TABLET | Freq: Every day | ORAL | Status: DC
  Administered 2018-03-27 – 2018-03-31 (×5): 80 mg via ORAL
  Filled 2018-03-26 (×5): qty 1

## 2018-03-26 MED ORDER — ORAL CARE MOUTH RINSE
15.0000 mL | Freq: Two times a day (BID) | OROMUCOSAL | Status: DC
  Administered 2018-03-26 – 2018-03-30 (×9): 15 mL via OROMUCOSAL

## 2018-03-26 MED ORDER — HYDROCODONE-ACETAMINOPHEN 5-325 MG PO TABS
1.0000 | ORAL_TABLET | ORAL | Status: DC | PRN
  Administered 2018-03-27 (×2): 1 via ORAL
  Administered 2018-03-28 – 2018-03-31 (×5): 2 via ORAL
  Filled 2018-03-26 (×3): qty 2
  Filled 2018-03-26: qty 1
  Filled 2018-03-26 (×2): qty 2
  Filled 2018-03-26: qty 1

## 2018-03-26 MED ORDER — BISACODYL 10 MG RE SUPP: 10.0000 mg | Freq: Every day | RECTAL | Status: DC | PRN

## 2018-03-26 MED ORDER — APIXABAN 5 MG PO TABS
5.0000 mg | ORAL_TABLET | Freq: Two times a day (BID) | ORAL | Status: DC
Start: 2018-03-26 — End: 2018-03-28
  Administered 2018-03-26 – 2018-03-28 (×4): 5 mg via ORAL
  Filled 2018-03-26 (×4): qty 1

## 2018-03-26 MED ORDER — ACETAMINOPHEN 650 MG RE SUPP: 650.0000 mg | Freq: Four times a day (QID) | RECTAL | Status: DC | PRN

## 2018-03-26 MED ORDER — FUROSEMIDE 10 MG/ML IJ SOLN
40.0000 mg | Freq: Two times a day (BID) | INTRAMUSCULAR | Status: DC
  Administered 2018-03-26 – 2018-03-27 (×3): 40 mg via INTRAVENOUS
  Filled 2018-03-26 (×4): qty 4

## 2018-03-26 MED ORDER — ASPIRIN EC 81 MG PO TBEC
81.0000 mg | DELAYED_RELEASE_TABLET | Freq: Every day | ORAL | Status: DC
  Administered 2018-03-27 – 2018-03-31 (×5): 81 mg via ORAL
  Filled 2018-03-26 (×5): qty 1

## 2018-03-26 MED ORDER — ONDANSETRON HCL 4 MG/2ML IJ SOLN: 4.0000 mg | Freq: Four times a day (QID) | INTRAMUSCULAR | Status: DC | PRN

## 2018-03-26 MED ORDER — METOPROLOL TARTRATE 12.5 MG HALF TABLET
12.5000 mg | ORAL_TABLET | Freq: Two times a day (BID) | ORAL | Status: DC
  Administered 2018-03-27 (×2): 12.5 mg via ORAL
  Filled 2018-03-26 (×2): qty 1

## 2018-03-26 MED ORDER — IPRATROPIUM-ALBUTEROL 0.5-2.5 (3) MG/3ML IN SOLN: 3.0000 mL | RESPIRATORY_TRACT | Status: DC | PRN

## 2018-03-26 MED ORDER — LISINOPRIL 5 MG PO TABS
2.5000 mg | ORAL_TABLET | Freq: Every day | ORAL | Status: DC
  Administered 2018-03-26 – 2018-03-27 (×2): 2.5 mg via ORAL
  Filled 2018-03-26 (×3): qty 1

## 2018-03-26 NOTE — ED Notes (Signed)
Family in room pt eating sandwich and  drink

## 2018-03-26 NOTE — ED Notes (Signed)
Report attempted x1. Nurse upstairs still in report.

## 2018-03-26 NOTE — ED Provider Notes (Addendum)
[facility] EMERGENCY DEPARTMENT Provider Note   CSN: [id number] Arrival date & time: 03/26/18  1129     History   Chief Complaint Chief Complaint  Patient presents with  . Shortness of Breath    HPI Craig [name] is a 67 y.o. male.  HPI  67 year old man history of smoking, COPD, coronary artery disease, A. fib, history of recurrent pleural effusions presents today complaining of increasing dyspnea over the past several weeks.  He states he has some dyspnea at baseline.  He is on home oxygen.  He reports that is been getting worse over several weeks.  He reports chronic cough with baseline white sputum production.  He reports no change in sputum production.  He denies any fever or chills.  He is complaining of increased weakness and difficulty being able to get around his house.  Patient received DuoNeb and 125 of Solu-Medrol prior to my evaluation.  Nursing note reports sats in the 80s on presentation. Past Medical History:  Diagnosis Date  . Alcohol abuse    Prior  . CAD (coronary artery disease) of bypass graft     4v CABG (LIMA to LAD, SVG to OM, seq SVG to PDA/PLA) by Dr. Laneta Simmers 01/07/2016  . COPD (chronic obstructive pulmonary disease) (HCC)    severe on PFT prior to CABG  . dilated ascending aorta    s/p supra-coronary ascending aortic and hemi-arch replacement using a 30 mm Hemashield under deep hypothermic circulatory arrest during CABG 01/07/2016  . Hyperlipidemia   . Hypertension   . Persistent atrial fibrillation (HCC)    in the setting of NSTEMI and persisted after [facility] 01/2016  . Recurrent left pleural effusion    s/p PleurX drain, occured after CABG  . Recurrent right pleural effusion    s/p PleurX drain, occured after CABG  . Severe aortic stenosis    s/p aortic valve replacement using 25 mm Edwards Magna-ease pericardial valve by Dr. Laneta Simmers during CABG 01/07/2016  . Tobacco abuse    Prior    Patient Active Problem List   Diagnosis Date  Noted  . Chronic respiratory failure with hypoxia (HCC) 03/20/2017  . Hemoptysis 03/20/2017  . Dyspnea 05/06/2016  . Encounter for therapeutic drug monitoring 02/15/2016  . Supplemental oxygen dependent   . Peripheral edema   . Hyperkalemia   . Hypoalbuminemia due to protein-calorie malnutrition (HCC)   . Debility 02/03/2016  . History of sepsis   . History of non-ST elevation myocardial infarction (NSTEMI)   . Coronary artery disease involving coronary bypass graft of native heart without angina pectoris   . History of aortic aneurysm repair   . COPD, severe (HCC)   . Leukocytosis   . Hyponatremia   . Thrombocytopenia (HCC)   . Acute blood loss anemia   . Sepsis (HCC)   . S/P CABG (coronary artery bypass graft)   . Paroxysmal atrial fibrillation (HCC)   . S/P CABG x 4   . Pleural effusion, bilateral   . Severe aortic stenosis   . Coronary artery disease involving native coronary artery of native heart without angina pectoris   . Coronary artery disease involving native coronary artery of native heart with unstable angina pectoris (HCC)   . CAD (coronary artery disease)   . Aortic stenosis   . History of hypertension   . History of hyperlipidemia   . Ascending aortic aneurysm (HCC)   . COPD (chronic obstructive pulmonary disease) (HCC) 12/26/2015  . Hypoxia 12/26/2015  .  Ruptured lumbar disc 02/17/2015  . Back pain 02/17/2015    Past Surgical History:  Procedure Laterality Date  . CARDIAC CATHETERIZATION N/A 12/29/2015   Procedure: Right/Left Heart Cath and Coronary Angiography;  Surgeon: Peter M Swaziland, MD;  Location: [facility] INVASIVE CV LAB;  Service: Cardiovascular;  Laterality: N/A;  . CHEST TUBE INSERTION Bilateral 01/25/2016   Procedure: INSERTION Bilateral PLEURAL DRAINAGE CATHETER;  Surgeon: Alleen Borne, MD;  Location: [facility] OR;  Service: Thoracic;  Laterality: Bilateral;  . CHEST TUBE INSERTION Bilateral 05/11/2016   Procedure: INSERTION PLEURAL DRAINAGE CATHETER ;   Surgeon: Alleen Borne, MD;  Location: [facility] OR;  Service: Thoracic;  Laterality: Bilateral;  . CORONARY ARTERY BYPASS GRAFT N/A 01/07/2016   Procedure: CORONARY ARTERY BYPASS GRAFTING (CABG), ON PUMP, TIMES FOUR, USING LEFT INTERNAL MAMMARY ARTERY, BILATERAL GREATER SAPHENOUS VEINS HARVESTED ENDOSCOPICALLY;  Surgeon: Alleen Borne, MD;  Location: [facility] OR;  Service: Open Heart Surgery;  Laterality: N/A;  LIMA to LAD, SVG to OM, SVG SEQUENTIALLY to PDA and PLB  . PLEURADESIS Bilateral 05/11/2016   Procedure: PLEURADESIS;  Surgeon: Alleen Borne, MD;  Location: [facility] OR;  Service: Thoracic;  Laterality: Bilateral;  . REMOVAL OF PLEURAL DRAINAGE CATHETER Left 03/17/2016   Procedure: REMOVAL OF PLEURAL DRAINAGE CATHETER;  Surgeon: Alleen Borne, MD;  Location: [facility] OR;  Service: Thoracic;  Laterality: Left;  . REMOVAL OF PLEURAL DRAINAGE CATHETER Bilateral 12/02/2016   Procedure: REMOVAL OF PLEURAL DRAINAGE CATHETER;  Surgeon: Alleen Borne, MD;  Location: [facility] OR;  Service: Thoracic;  Laterality: Bilateral;  . ruptured disc repair  2009 or 2010  . TALC PLEURODESIS Bilateral 07/06/2016   Procedure: BILATERAL DOXYCYCLINE PLEURADESIS;  Surgeon: Alleen Borne, MD;  Location: [facility] OR;  Service: Thoracic;  Laterality: Bilateral;  . TEE WITHOUT CARDIOVERSION N/A 01/07/2016   Procedure: TRANSESOPHAGEAL ECHOCARDIOGRAM (TEE);  Surgeon: Alleen Borne, MD;  Location[facility] OR;  Service: Open Heart Surgery;  Laterality: N/A;  . THORACIC AORTIC ANEURYSM REPAIR N/A 01/07/2016   Procedure: CIRC ARREST AND REPLACEMENT OF ASCENDING THORACIC  ANEURYSM;  Surgeon: Alleen Borne, MD;  Location: [facility] OR;  Service: Open Heart Surgery;  Laterality: N/A;        Home Medications    Prior to Admission medications   Medication Sig Start Date End Date Taking? Authorizing Provider  apixaban (ELIQUIS) 5 MG TABS tablet Take 1 tablet (5 mg total) by mouth 2 (two) times daily. 03/12/18   Kathleene Hazel, MD  aspirin 81 MG EC tablet Take 1  tablet (81 mg total) by mouth daily. 02/12/16   Love, Evlyn Kanner, PA-C  atorvastatin (LIPITOR) 80 MG tablet Take 1 tablet (80 mg total) by mouth daily. 03/12/18   Kathleene Hazel, MD  furosemide (LASIX) 40 MG tablet Take 1 tablet (40 mg total) by mouth daily. 03/12/18   Kathleene Hazel, MD  metoprolol tartrate (LOPRESSOR) 25 MG tablet Take 0.5 tablets (12.5 mg total) by mouth 2 (two) times daily. 03/12/18   Kathleene Hazel, MD  nitroGLYCERIN (NITROSTAT) 0.4 MG SL tablet Place 1 tablet (0.4 mg total) under the tongue every 5 (five) minutes x 3 doses as needed for chest pain. 03/12/18   Kathleene Hazel, MD  potassium chloride SA (KLOR-CON M20) 20 MEQ tablet Take 1 tablet (20 mEq total) by mouth 2 (two) times daily. 03/12/18   Kathleene Hazel, MD  sodium chloride (OCEAN) 0.65 % SOLN nasal spray Place 1 spray into both nostrils as needed  for congestion.    [provider]    Family History Family History  Problem Relation Age of Onset  . Stroke Mother   . Diabetes Father   . Stroke Father   . Kidney disease Father   . Emphysema Father   . Kidney disease Sister   . Kidney disease Brother   . Lung disease Brother        Rare lung disease w/ inflammation  . Heart attack Neg Hx   . Rheumatologic disease Neg Hx     Social History Social History   Tobacco Use  . Smoking status: Former Smoker    Packs/day: 2.00    Years: 46.00    Pack years: 92.00    Last attempt to quit: 11/07/2008    Years since quitting: 9.3  . Smokeless tobacco: Never Used  . Tobacco comment: smoked 1-2 packs per day depending on drinking  Substance Use Topics  . Alcohol use: No    Alcohol/week: 0.0 oz    Comment: quit 2004 - 12 pack per day for 30 years  . Drug use: No    Comment: quit Marijuana in 2014     Allergies   No known allergies   Review of Systems Review of Systems  All other systems reviewed and are negative.    Physical Exam Updated Vital Signs BP 127/85  (BP Location: Right Arm)   Pulse 87   Temp 98.3 F (36.8 C) (Oral)   Resp (!) 22   SpO2 93% Comment: 3 liters  Physical Exam  Constitutional: He is oriented to person, place, and time. He appears well-developed. He appears ill. No distress.  Unkempt appearance  HENT:  Head: Normocephalic and atraumatic.  Mouth/Throat: Oropharynx is clear and moist.  Eyes: Pupils are equal, round, and reactive to light.  Neck: Normal range of motion. Neck supple.  Cardiovascular: Normal rate. An irregularly irregular rhythm present.  Pulmonary/Chest:  Some increased work of breathing with decreased breath sounds throughout  Musculoskeletal: Normal range of motion.       Right lower leg: He exhibits edema.       Left lower leg: He exhibits edema.  Neurological: He is alert and oriented to person, place, and time.  Skin: Skin is warm and dry. Capillary refill takes less than 2 seconds.  Nursing note and vitals reviewed.    ED Treatments / Results  Labs (all labs ordered are listed, but only abnormal results are displayed) Labs Reviewed  BASIC METABOLIC PANEL  CBC  I-STAT TROPONIN, ED    EKG EKG Interpretation  Date/Time:  Monday Mar 26 2018 11:35:52 EDT Ventricular Rate:  97 PR Interval:    QRS Duration: 128 QT Interval:  365 QTC Calculation: 464 R Axis:   51 Text Interpretation:  Atrial fibrillation LVH with secondary repolarization abnormality Anterior ST elevation, probably due to LVH Confirmed by Margarita Grizzle [phone]) on 03/26/2018 12:04:24 PM   Radiology Dg Chest 2 View  Result Date: 03/26/2018 CLINICAL DATA:  Shortness of breath, weakness x3 days EXAM: CHEST - 2 VIEW COMPARISON:  03/20/2017 FINDINGS: Mild patchy right lower lobe opacity, atelectasis versus pneumonia. Increased interstitial markings.  No frank interstitial edema. Small bilateral pleural effusions.  No pneumothorax. Heart is top-normal in size. Prosthetic aortic valve. Postsurgical changes related to prior CABG.  Median sternotomy. IMPRESSION: Mild patchy right lower lobe opacity, atelectasis versus pneumonia. Small bilateral pleural effusions.  No frank interstitial edema. Electronically Signed   By: Roselie Awkward.D.  On: 03/26/2018 12:37    Procedures Procedures (including critical care time)  Medications Ordered in ED Medications - No data to display   Initial Impression / Assessment and Plan / ED Course  I have reviewed the triage vital signs and the nursing notes.  Pertinent labs & imaging results that were available during my care of the patient were reviewed by me and considered in my medical decision making (see chart for details).    2:03 PM Reports continued dyspnea.  Reports no change with bronchodilator.  Chest x-Inell Mimbs reviewed with possible right lower lobe infiltrate.  Levaquin ordered.  Positive troponin.  Plan admission for further evaluation treatment 1-copd-solumedrol given, albuterol without relief 2- cap-treated with po levaquin 3-positive troponin-patient treated with aspirin Discussed with Ms. Wertman and will see for admission Final Clinical Impressions(s) / ED Diagnoses   Final diagnoses:  Community acquired pneumonia of right lower lobe of lung (HCC)  Elevated troponin    ED Discharge Orders    None       Margarita Grizzle, MD 03/26/18 1525    Margarita Grizzle, MD 03/26/18 [phone]

## 2018-03-26 NOTE — ED Triage Notes (Signed)
Weakness x 3 dfays and extreme sob on exertion is on home o2 at 2 but had decreased  Sat  In the 80s on o2 got a duo neb and now better and 125 solumedrol has iv 20 left forearm  sats better after neb

## 2018-03-26 NOTE — Progress Notes (Signed)
Upon arrival to administer nebulizer. Patient leaving room to 5W. Receiving RT aware.

## 2018-03-26 NOTE — ED Notes (Signed)
Med requested from pharm has not arrived yet

## 2018-03-26 NOTE — H&P (Signed)
History and Physical    Craig [name] ZOX:[id number] DOB: August 26, 1951 DOA: 03/26/2018   PCP: Rose Fillers, PA-C   Patient coming from:  Home    Chief Complaint: Shortness of breath   HPI: AARSH [name] is a 67 y.o. male with medical history significant for severe COPD on home oxygen at 2 L, CAD, atrial fibrillation, history of recurrent pleural effusions status post pleurodesis in the past, presenting to the emergency department complaining of several week history of increasing dyspnea.  He also reports chronic cough with baseline white sputum production.  Recently, he had been seen at the office of the pulmonologist, Mr. Marlyne Beards, with similar symptoms (03/20/2018) at which time a full pulmonary function test and screening for alpha-1 antitrypsin deficiency were scheduled, and was being referred to pulmonary rehab at [facility].  Presentation, he denies any fever, chills, or any sick contacts.  "He just does not feel any better ".  He denies any chest pain or palpitations, and he denies any abdominal pain, nausea or vomiting, lower extremity swelling, or calf pain.  No recent admissions to the hospital, recent long distance trips.  Patient is compliant with his medications, he is taking Eliquis for atrial fibrillation, not missing any doses.  Denies any tobacco, alcohol or recreational drug use at this time.  ED Course:  BP (!) 132/92   Pulse (!) 43   Temp 98.3 F (36.8 C) (Oral)   Resp 14   SpO2 91%   Chest x-ray showed mild patchy right lower opacity, atelectasis versus pneumonia.  Small bilateral pleural effusion, no frank interstitial edema. Troponin0 0.16.  EKG showed atrial fibrillation, LVH, with anterior ST elevation probably due to LVH Received Solu-Medrol 125 mg IV x1, Levaquin ordered, as well as albuterol nebulizer. White count 4.6, hemoglobin 11.7, platelets 237  Review of Systems:  As per HPI otherwise all other systems reviewed and are negative  Past Medical  History:  Diagnosis Date  . Alcohol abuse    Prior  . CAD (coronary artery disease) of bypass graft     4v CABG (LIMA to LAD, SVG to OM, seq SVG to PDA/PLA) by Dr. Laneta Simmers 01/07/2016  . COPD (chronic obstructive pulmonary disease) (HCC)    severe on PFT prior to CABG  . dilated ascending aorta    s/p supra-coronary ascending aortic and hemi-arch replacement using a 30 mm Hemashield under deep hypothermic circulatory arrest during CABG 01/07/2016  . Hyperlipidemia   . Hypertension   . Persistent atrial fibrillation (HCC)    in the setting of NSTEMI and persisted after [facility] 01/2016  . Recurrent left pleural effusion    s/p PleurX drain, occured after CABG  . Recurrent right pleural effusion    s/p PleurX drain, occured after CABG  . Severe aortic stenosis    s/p aortic valve replacement using 25 mm Edwards Magna-ease pericardial valve by Dr. Laneta Simmers during CABG 01/07/2016  . Tobacco abuse    Prior    Past Surgical History:  Procedure Laterality Date  . CARDIAC CATHETERIZATION N/A 12/29/2015   Procedure: Right/Left Heart Cath and Coronary Angiography;  Surgeon: Peter M Swaziland, MD;  Location: [facility] INVASIVE CV LAB;  Service: Cardiovascular;  Laterality: N/A;  . CHEST TUBE INSERTION Bilateral 01/25/2016   Procedure: INSERTION Bilateral PLEURAL DRAINAGE CATHETER;  Surgeon: Alleen Borne, MD;  Location: [facility] OR;  Service: Thoracic;  Laterality: Bilateral;  . CHEST TUBE INSERTION Bilateral 05/11/2016   Procedure: INSERTION PLEURAL DRAINAGE CATHETER ;  Surgeon: Judie Grieve  Jennefer Bravo, MD;  Location: [facility] OR;  Service: Thoracic;  Laterality: Bilateral;  . CORONARY ARTERY BYPASS GRAFT N/A 01/07/2016   Procedure: CORONARY ARTERY BYPASS GRAFTING (CABG), ON PUMP, TIMES FOUR, USING LEFT INTERNAL MAMMARY ARTERY, BILATERAL GREATER SAPHENOUS VEINS HARVESTED ENDOSCOPICALLY;  Surgeon: Alleen Borne, MD;  Location: [facility] OR;  Service: Open Heart Surgery;  Laterality: N/A;  LIMA to LAD, SVG to OM, SVG SEQUENTIALLY to PDA and PLB  .  PLEURADESIS Bilateral 05/11/2016   Procedure: PLEURADESIS;  Surgeon: Alleen Borne, MD;  Location[facility] OR;  Service: Thoracic;  Laterality: Bilateral;  . REMOVAL OF PLEURAL DRAINAGE CATHETER Left 03/17/2016   Procedure: REMOVAL OF PLEURAL DRAINAGE CATHETER;  Surgeon: Alleen Borne, MD;  Location: [facility] OR;  Service: Thoracic;  Laterality: Left;  . REMOVAL OF PLEURAL DRAINAGE CATHETER Bilateral 12/02/2016   Procedure: REMOVAL OF PLEURAL DRAINAGE CATHETER;  Surgeon: Alleen Borne, MD;  Location: [facility] OR;  Service: Thoracic;  Laterality: Bilateral;  . ruptured disc repair  2009 or 2010  . TALC PLEURODESIS Bilateral 07/06/2016   Procedure: BILATERAL DOXYCYCLINE PLEURADESIS;  Surgeon: Alleen Borne, MD;  Location: [facility] OR;  Service: Thoracic;  Laterality: Bilateral;  . TEE WITHOUT CARDIOVERSION N/A 01/07/2016   Procedure: TRANSESOPHAGEAL ECHOCARDIOGRAM (TEE);  Surgeon: Alleen Borne, MD;  [facility];  Service: Open Heart Surgery;  Laterality: N/A;  . THORACIC AORTIC ANEURYSM REPAIR N/A 01/07/2016   Procedure: CIRC ARREST AND REPLACEMENT OF ASCENDING THORACIC  ANEURYSM;  Surgeon: Alleen Borne, MD;  Location: [facility] OR;  Service: Open Heart Surgery;  Laterality: N/A;    Social History Social History   Socioeconomic History  . Marital status: Divorced    Spouse name: Not on file  . Number of children: 3  . Years of education: GED  . Highest education level: Not on file  Occupational History  . Occupation: Retired Psychologist, forensic  Social Needs  . Financial resource strain: Not on file  . Food insecurity:    Worry: Not on file    Inability: Not on file  . Transportation needs:    Medical: Not on file    Non-medical: Not on file  Tobacco Use  . Smoking status: Former Smoker    Packs/day: 2.00    Years: 46.00    Pack years: 92.00    Last attempt to quit: 11/07/2008    Years since quitting: 9.3  . Smokeless tobacco: Never Used  . Tobacco comment: smoked 1-2 packs per day depending on  drinking  Substance and Sexual Activity  . Alcohol use: No    Alcohol/week: 0.0 oz    Comment: quit 2004 - 12 pack per day for 30 years  . Drug use: No    Comment: quit Marijuana in 2014  . Sexual activity: Not on file  Lifestyle  . Physical activity:    Days per week: Not on file    Minutes per session: Not on file  . Stress: Not on file  Relationships  . Social connections:    Talks on phone: Not on file    Gets together: Not on file    Attends religious service: Not on file    Active member of club or organization: Not on file    Attends meetings of clubs or organizations: Not on file    Relationship status: Not on file  . Intimate partner violence:    Fear of current or ex partner: Not on file    Emotionally abused: Not  on file    Physically abused: Not on file    Forced sexual activity: Not on file  Other Topics Concern  . Not on file  Social History Narrative   Lives with son   Drinks one cup of coffee a day and one soda q2-3 days      [facility] (03/20/17):   Originally from [place], [place]. Previously worked as a Psychologist, forensic. No pets currently. Remote cockatiel exposure. No mold or hot tub exposure. Currently living alone.      Allergies  Allergen Reactions  . No Known Allergies Other (See Comments)    Family History  Problem Relation Age of Onset  . Stroke Mother   . Diabetes Father   . Stroke Father   . Kidney disease Father   . Emphysema Father   . Kidney disease Sister   . Kidney disease Brother   . Lung disease Brother        Rare lung disease w/ inflammation  . Heart attack Neg Hx   . Rheumatologic disease Neg Hx       Prior to Admission medications   Medication Sig Start Date End Date Taking? Authorizing Provider  apixaban (ELIQUIS) 5 MG TABS tablet Take 1 tablet (5 mg total) by mouth 2 (two) times daily. 03/12/18  Yes Kathleene Hazel, MD  aspirin 81 MG EC tablet Take 1 tablet (81 mg total) by mouth daily. 02/12/16   Yes Love, Evlyn Kanner, PA-C  atorvastatin (LIPITOR) 80 MG tablet Take 1 tablet (80 mg total) by mouth daily. 03/12/18  Yes Kathleene Hazel, MD  furosemide (LASIX) 40 MG tablet Take 1 tablet (40 mg total) by mouth daily. Patient taking differently: Take 40 mg by mouth every other day.  03/12/18  Yes Kathleene Hazel, MD  metoprolol tartrate (LOPRESSOR) 25 MG tablet Take 0.5 tablets (12.5 mg total) by mouth 2 (two) times daily. 03/12/18  Yes Kathleene Hazel, MD  nitroGLYCERIN (NITROSTAT) 0.4 MG SL tablet Place 1 tablet (0.4 mg total) under the tongue every 5 (five) minutes x 3 doses as needed for chest pain. 03/12/18  Yes Kathleene Hazel, MD  OXYGEN Inhale 2 L into the lungs continuous.   Yes [provider]  potassium chloride SA (KLOR-CON M20) 20 MEQ tablet Take 1 tablet (20 mEq total) by mouth 2 (two) times daily. 03/12/18  Yes Kathleene Hazel, MD    Physical Exam:  Vitals:   03/26/18 1615 03/26/18 1630 03/26/18 1645 03/26/18 1700  BP: (!) 134/96 120/84 136/86 (!) 132/92  Pulse: 98 (!) 107 (!) 104 (!) 43  Resp: 15 (!) 32 17 14  Temp:      TempSrc:      SpO2: 91% 90% 94% 91%   Constitutional: NAD, calm, comfortable, very thin, chronically ill-appearing, unkempt appearance   eyes: PERRL, lids and conjunctivae normal ENMT: Mucous membranes are moist, without exudate or lesions  Neck: normal, supple, no masses, no thyromegaly Respiratory: Decreased breath sounds at the bases, Barbara chest noted, no accessory muscle use  no wheezing, no crackles. Cardiovascular: irregularly irregular rate and rhythm,  murmur, rubs or gallops. No extremity edema. 2+ pedal pulses. No carotid bruits.  Abdomen: Soft, non tender, No hepatosplenomegaly. Bowel sounds positive.  Musculoskeletal: no clubbing / cyanosis. Moves all extremities Skin: no jaundice, No lesions. Many nevi noted  Neurologic: Sensation intact  Strength equal in all extremities Psychiatric:   Alert and  oriented x 3. Flat affect  Labs on Admission: I have personally reviewed following labs and imaging studies  CBC: Recent Labs  Lab 03/26/18 1407  WBC 4.6  HGB 11.7*  HCT 37.9*  MCV 102.4*  PLT 137*    Basic Metabolic Panel: Recent Labs  Lab 03/26/18 1407  NA 141  K 4.2  CL 106  CO2 28  GLUCOSE 137*  BUN 21*  CREATININE 1.01  CALCIUM 9.0    GFR: CrCl cannot be calculated (Unknown ideal weight.).  Liver Function Tests: No results for input(s): AST, ALT, ALKPHOS, BILITOT, PROT, ALBUMIN in the last 168 hours. No results for input(s): LIPASE, AMYLASE in the last 168 hours. No results for input(s): AMMONIA in the last 168 hours.  Coagulation Profile: No results for input(s): INR, PROTIME in the last 168 hours.  Cardiac Enzymes: No results for input(s): CKTOTAL, CKMB, CKMBINDEX, TROPONINI in the last 168 hours.  BNP (last 3 results) No results for input(s): PROBNP in the last 8760 hours.  HbA1C: No results for input(s): HGBA1C in the last 72 hours.  CBG: No results for input(s): GLUCAP in the last 168 hours.  Lipid Profile: No results for input(s): CHOL, HDL, LDLCALC, TRIG, CHOLHDL, LDLDIRECT in the last 72 hours.  Thyroid Function Tests: No results for input(s): TSH, T4TOTAL, FREET4, T3FREE, THYROIDAB in the last 72 hours.  Anemia Panel: No results for input(s): VITAMINB12, FOLATE, FERRITIN, TIBC, IRON, RETICCTPCT in the last 72 hours.  Urine analysis:    Component Value Date/Time   COLORURINE YELLOW 01/29/2016 0651   APPEARANCEUR CLOUDY (A) 01/29/2016 0651   LABSPEC 1.024 01/29/2016 0651   PHURINE 5.0 01/29/2016 0651   GLUCOSEU NEGATIVE 01/29/2016 0651   HGBUR NEGATIVE 01/29/2016 0651   BILIRUBINUR NEGATIVE 01/29/2016 0651   KETONESUR NEGATIVE 01/29/2016 0651   PROTEINUR NEGATIVE 01/29/2016 0651   NITRITE NEGATIVE 01/29/2016 0651   LEUKOCYTESUR NEGATIVE 01/29/2016 0651    Sepsis Labs: (procalcitonin:4,lacticidven:4) )No results  found for this or any previous visit (from the past 240 hour(s)).   Radiological Exams on Admission: Dg Chest 2 View  Result Date: 03/26/2018 CLINICAL DATA:  Shortness of breath, weakness x3 days EXAM: CHEST - 2 VIEW COMPARISON:  03/20/2017 FINDINGS: Mild patchy right lower lobe opacity, atelectasis versus pneumonia. Increased interstitial markings.  No frank interstitial edema. Small bilateral pleural effusions.  No pneumothorax. Heart is top-normal in size. Prosthetic aortic valve. Postsurgical changes related to prior CABG. Median sternotomy. IMPRESSION: Mild patchy right lower lobe opacity, atelectasis versus pneumonia. Small bilateral pleural effusions.  No frank interstitial edema. Electronically Signed   By: Charline Bills M.D.   On: 03/26/2018 12:37    EKG: Independently reviewed. Atrial fibrillation LVH with secondary repolarization abnormality Anterior ST elevation, probably due to LVH    Assessment/Plan Principal Problem:   Acute on chronic respiratory failure with hypoxia (HCC) Active Problems:   COPD exacerbation (HCC)   History of hypertension   History of hyperlipidemia   CAD (coronary artery disease)   Pleural effusion, bilateral   S/P CABG (coronary artery bypass graft)   Paroxysmal atrial fibrillation (HCC)   COPD, severe (HCC)   Troponin0 0.16.  EKG showed atrial fibrillation, LVH, with anterior ST elevation probably due to LVH .  hemoglobin 11.7, platelets 237  Acute on chronic hypoxic respiratory failure likely secondary to acute COPD exacerbation, although may be a CHF component (see below) . PSI 76 risk class 3 Chest x-ray showed mild patchy right lower opacity, atelectasis versus pneumonia.  Small bilateral pleural effusion, no frank interstitial  edema.White count 4.6,Received Solu-Medrol 125 mg IV x1, Levaquin ordered, as well as albuterol nebulizer  Admit to  Tele Obs   Change to Doxycycline bid   Mucinex O2 prn CBC in am Incentive spirometry If  symptoms not improve, will obtain pulmonology evaluation when in the hospital  Acute hypoxic respiratory failure rule out CHF component, in view of bilateral pleural effusion, frothy sputum, and improvement with current respiratory treatment., specifically bronchodilators.  As to the alcohol in July 2017 shows EF 55 to 60%, normal systolic Telemetry IV Lasix 40 mg IV x1 in addition to his Lasix 40 mg every other day Oxigen Monitor I/O and weight daily  Elevated Troponin in a patient with history of CAD status post CABG, the patient is asymptomatic. likely due to demand ischemia secondary oxygen demand, curtrently at 0.16  Trend troponin, repeat EKG in a.m.  Atrial Fibrillation on anticoagulation with Eliquis  Rate controlled Continue Eliquis,    Hypertension BP  132/92   Pulse   43  Controlled Hold  home anti-hypertensive medications in am, patient took his meds today    Hyperlipidemia Continue home statins       DVT prophylaxis:  Eliquis  Code Status:  FUll    Family Communication:  Discussed with patient Disposition Plan: Expect patient to be discharged to home after condition improves Consults called:    None Admission status: Tele obs   Marlowe Kays, PA-C [facility]   Amion text  [phone]   03/26/2018, 5:29 PM

## 2018-03-27 ENCOUNTER — Observation Stay (HOSPITAL_BASED_OUTPATIENT_CLINIC_OR_DEPARTMENT_OTHER): Payer: PPO

## 2018-03-27 DIAGNOSIS — J9601 Acute respiratory failure with hypoxia: Secondary | ICD-10-CM | POA: Diagnosis present

## 2018-03-27 DIAGNOSIS — I5043 Acute on chronic combined systolic (congestive) and diastolic (congestive) heart failure: Secondary | ICD-10-CM | POA: Diagnosis not present

## 2018-03-27 DIAGNOSIS — Z87891 Personal history of nicotine dependence: Secondary | ICD-10-CM | POA: Diagnosis not present

## 2018-03-27 DIAGNOSIS — I480 Paroxysmal atrial fibrillation: Secondary | ICD-10-CM | POA: Diagnosis present

## 2018-03-27 DIAGNOSIS — Z7982 Long term (current) use of aspirin: Secondary | ICD-10-CM | POA: Diagnosis not present

## 2018-03-27 DIAGNOSIS — I248 Other forms of acute ischemic heart disease: Secondary | ICD-10-CM | POA: Diagnosis present

## 2018-03-27 DIAGNOSIS — I340 Nonrheumatic mitral (valve) insufficiency: Secondary | ICD-10-CM | POA: Diagnosis not present

## 2018-03-27 DIAGNOSIS — I110 Hypertensive heart disease with heart failure: Secondary | ICD-10-CM | POA: Diagnosis present

## 2018-03-27 DIAGNOSIS — Z515 Encounter for palliative care: Secondary | ICD-10-CM | POA: Diagnosis not present

## 2018-03-27 DIAGNOSIS — Z841 Family history of disorders of kidney and ureter: Secondary | ICD-10-CM | POA: Diagnosis not present

## 2018-03-27 DIAGNOSIS — J441 Chronic obstructive pulmonary disease with (acute) exacerbation: Secondary | ICD-10-CM | POA: Diagnosis present

## 2018-03-27 DIAGNOSIS — R748 Abnormal levels of other serum enzymes: Secondary | ICD-10-CM

## 2018-03-27 DIAGNOSIS — R0602 Shortness of breath: Secondary | ICD-10-CM | POA: Diagnosis present

## 2018-03-27 DIAGNOSIS — Z7189 Other specified counseling: Secondary | ICD-10-CM | POA: Diagnosis not present

## 2018-03-27 DIAGNOSIS — I252 Old myocardial infarction: Secondary | ICD-10-CM | POA: Diagnosis not present

## 2018-03-27 DIAGNOSIS — J449 Chronic obstructive pulmonary disease, unspecified: Secondary | ICD-10-CM | POA: Diagnosis not present

## 2018-03-27 DIAGNOSIS — Z823 Family history of stroke: Secondary | ICD-10-CM | POA: Diagnosis not present

## 2018-03-27 DIAGNOSIS — T502X5A Adverse effect of carbonic-anhydrase inhibitors, benzothiadiazides and other diuretics, initial encounter: Secondary | ICD-10-CM | POA: Diagnosis not present

## 2018-03-27 DIAGNOSIS — I5041 Acute combined systolic (congestive) and diastolic (congestive) heart failure: Secondary | ICD-10-CM | POA: Diagnosis present

## 2018-03-27 DIAGNOSIS — N179 Acute kidney failure, unspecified: Secondary | ICD-10-CM | POA: Diagnosis not present

## 2018-03-27 DIAGNOSIS — E785 Hyperlipidemia, unspecified: Secondary | ICD-10-CM | POA: Diagnosis present

## 2018-03-27 DIAGNOSIS — I2510 Atherosclerotic heart disease of native coronary artery without angina pectoris: Secondary | ICD-10-CM | POA: Diagnosis present

## 2018-03-27 DIAGNOSIS — Z7901 Long term (current) use of anticoagulants: Secondary | ICD-10-CM | POA: Diagnosis not present

## 2018-03-27 DIAGNOSIS — J90 Pleural effusion, not elsewhere classified: Secondary | ICD-10-CM | POA: Diagnosis not present

## 2018-03-27 DIAGNOSIS — Z825 Family history of asthma and other chronic lower respiratory diseases: Secondary | ICD-10-CM | POA: Diagnosis not present

## 2018-03-27 DIAGNOSIS — Z833 Family history of diabetes mellitus: Secondary | ICD-10-CM | POA: Diagnosis not present

## 2018-03-27 DIAGNOSIS — Z8679 Personal history of other diseases of the circulatory system: Secondary | ICD-10-CM | POA: Diagnosis not present

## 2018-03-27 DIAGNOSIS — Z9981 Dependence on supplemental oxygen: Secondary | ICD-10-CM | POA: Diagnosis not present

## 2018-03-27 DIAGNOSIS — J9621 Acute and chronic respiratory failure with hypoxia: Secondary | ICD-10-CM | POA: Diagnosis present

## 2018-03-27 DIAGNOSIS — Z951 Presence of aortocoronary bypass graft: Secondary | ICD-10-CM | POA: Diagnosis not present

## 2018-03-27 DIAGNOSIS — Z952 Presence of prosthetic heart valve: Secondary | ICD-10-CM | POA: Diagnosis not present

## 2018-03-27 DIAGNOSIS — M25511 Pain in right shoulder: Secondary | ICD-10-CM | POA: Diagnosis not present

## 2018-03-27 LAB — CBC
HCT: 34 % — ABNORMAL LOW (ref 39.0–52.0)
Hemoglobin: 10.8 g/dL — ABNORMAL LOW (ref 13.0–17.0)
MCH: 32 pg (ref 26.0–34.0)
MCHC: 31.8 g/dL (ref 30.0–36.0)
MCV: 100.6 fL — ABNORMAL HIGH (ref 78.0–100.0)
PLATELETS: 134 10*3/uL — AB (ref 150–400)
RBC: 3.38 MIL/uL — ABNORMAL LOW (ref 4.22–5.81)
RDW: 15.1 % (ref 11.5–15.5)
WBC: 5.2 10*3/uL (ref 4.0–10.5)

## 2018-03-27 LAB — BASIC METABOLIC PANEL
Anion gap: 7 (ref 5–15)
BUN: 21 mg/dL — AB (ref 6–20)
CALCIUM: 8.8 mg/dL — AB (ref 8.9–10.3)
CO2: 31 mmol/L (ref 22–32)
CREATININE: 1.14 mg/dL (ref 0.61–1.24)
Chloride: 104 mmol/L (ref 101–111)
GFR calc non Af Amer: >60 mL/min (ref >60)
GLUCOSE: 164 mg/dL — AB (ref 65–99)
POTASSIUM: 3.9 mmol/L (ref 3.5–5.1)
SODIUM: 142 mmol/L (ref 135–145)

## 2018-03-27 LAB — HIV ANTIBODY (ROUTINE TESTING W REFLEX): HIV Screen 4th Generation wRfx: NONREACTIVE

## 2018-03-27 LAB — ECHOCARDIOGRAM COMPLETE
Height: 69 in
WEIGHTICAEL: 2059.98 [oz_av]

## 2018-03-27 MED ORDER — PNEUMOCOCCAL VAC POLYVALENT 25 MCG/0.5ML IJ INJ: 0.5000 mL | INJECTION | INTRAMUSCULAR | Status: DC | PRN

## 2018-03-27 MED ORDER — IPRATROPIUM-ALBUTEROL 0.5-2.5 (3) MG/3ML IN SOLN
3.0000 mL | Freq: Three times a day (TID) | RESPIRATORY_TRACT | Status: DC
  Administered 2018-03-27 – 2018-03-30 (×8): 3 mL via RESPIRATORY_TRACT
  Filled 2018-03-27 (×9): qty 3

## 2018-03-27 NOTE — Consult Note (Signed)
Consultation Note Date: 03/27/18  Patient Name: Craig [name]  DOB: Oct 15, 1951  MRN: [id number]  Age / Sex: 67 y.o., male  PCP: Ysidro Evert, PA-C Referring Physician: Geradine Girt, DO  Reason for Consultation: Establishing goals of care  HPI/Patient Profile: 67 y.o. male  with past medical history of COPD on home oxygen, CHF, CAD, CABG, recurrent pleural effusions s/p pleurodesis and pleurx catheters (removed 09/2016), afib, HTN, HLD, aoritic stenosis, and smoker admitted on 03/26/2018 with shortness of breath and cough. In ED, chest xray revealed atelectasis versus pneumonia with small bilateral pleural effusions. BNP 1084. Acute on chronic respiratory failure secondary to CHF exacerbation and with underlying COPD. Receiving nebs and lasix BID. Echo pending. Palliative medicine consultation for goals of care.   Clinical Assessment and Goals of Care: I have reviewed medical records, discussed with care team, and met with patient at bedside to discuss diagnosis, GOC, EOL wishes, disposition and options.  Introduced Palliative Medicine as specialized medical care for people living with serious illness. It focuses on providing relief from the symptoms and stress of a serious illness. The goal is to improve quality of life for both the patient and the family.  We discussed a brief life review of the patient. Divorced with one son, who is 32 years old. He speaks of support from his sister and two brothers. He loved hunting and fishing, which he no longer can do. Craig [name] lives alone. It has become more challenging completing ADL's due to shortness of breath with minimal exertion. He still drives but only when he can breath well. He has been wearing oxygen for about 2 years, started after recurrent pleural effusions/pleurx catheters/pleurodesis.   Discussed hospital diagnoses and interventions. Craig [name] is waiting  for ECHO to be performed today. We did discuss disease trajectory of COPD and CHF. He understands these are both chronic and progressive. We discussed worsening symptoms likely indicating disease progression.   I attempted to elicit values and goals of care important to the patient. Craig [name] is most worried about meals at home. With increasing shortness of breath, it is harder for Craig [name] to drive and get food. Son will occasionally bring groceries.   Advanced directives, concepts specific to code status, and artifical feeding and hydration were discussed. Craig [name] has documented advanced directives (reviewed in Epic). He confirms his two brothers Louie Casa and Joneen Caraway) and also sister Butch Penny) as HCPOA's if he was unable to make decisions for himself. Craig [name] tells me his brothers frequently work and his sister has been the most supportive in regards to his care.   Introduced MOST form. Explored Steve's thoughts on heroic measures including resuscitation, life support, feeding tube. He speaks of his father having a feeding tube at EOL. In regards to intubation, he states "I don't want to be smothered." He is interested in reviewing MOST form with sister, Butch Penny.   Discussed palliative approach to care including symptom management when chronic disease progresses. He wishes to stay home as long as possible.   Questions/concerns addressed. PMT contact  information given.   **Also spoke with sister, Butch Penny via telephone. She is unable to leave work this week to meet with Craig [name] and I. She is ok with me putting her on speaker phone tomorrow afternoon when I visit with patient. Butch Penny shares that [employer] have called him the "million dollar man," explaining his prolonged hospitalization from open heart surgery. She was surprised he survived after that acute illness.   Butch Penny believes he is accepting the progression of his COPD/CHF by him telling her "I'm not going to get stronger" and "Not going to be around much  longer." She tells me he refuses to work with pulmonary rehab and that "inhalers don't work" therefore he will not use them.   She is interested in hearing more about outpatient palliative/hospice options. She has insurance questions. Also interested in meals on wheels for him if possible.    SUMMARY OF RECOMMENDATIONS    Initial palliative discussion with patient and sister via telephone. She is unable to leave work this week but requests I call tomorrow and put her on speaker phone when I f/u with patient.   Discussed disease trajectory of COPD/CHF.  Patient has documented HCPOA paperwork in epic. Two brothers Joneen Caraway and Louie Casa) and sister Butch Penny) are documented HCPOA's.   Introduced MOST form and encouraged patient to consider his wishes regarding heroic interventions.   ECHO pending.   PMT will follow.  Code Status/Advance Care Planning:  Full code  Symptom Management:   Per attending  Palliative Prophylaxis:   Bowel Regimen and Frequent Pain Assessment  Additional Recommendations (Limitations, Scope, Preferences):  Full Scope Treatment  Psycho-social/Spiritual:   Desire for further Chaplaincy support:yes  Additional Recommendations: Caregiving  Support/Resources and Education on Hospice  Prognosis:   Unable to determine  Discharge Planning: To Be Determined      Primary Diagnoses: Present on Admission: . Acute on chronic respiratory failure with hypoxia ([place]) . CAD (coronary artery disease) . Pleural effusion, bilateral . Paroxysmal atrial fibrillation ([place]) . COPD, severe ([place]) . Acute respiratory failure with hypoxia ([place])   I have reviewed the medical record, interviewed the patient and family, and examined the patient. The following aspects are pertinent.  Past Medical History:  Diagnosis Date  . Alcohol abuse    Prior  . CAD (coronary artery disease) of bypass graft     4v CABG (LIMA to LAD, SVG to OM, seq SVG to PDA/PLA) by Dr. Cyndia Bent  01/07/2016  . COPD (chronic obstructive pulmonary disease) (HCC)    severe on PFT prior to CABG  . dilated ascending aorta    s/p supra-coronary ascending aortic and hemi-arch replacement using a 30 mm Hemashield under deep hypothermic circulatory arrest during CABG 01/07/2016  . Hyperlipidemia   . Hypertension   . Persistent atrial fibrillation (HCC)    in the setting of NSTEMI and persisted after [facility] 01/2016  . Recurrent left pleural effusion    s/p PleurX drain, occured after CABG  . Recurrent right pleural effusion    s/p PleurX drain, occured after CABG  . Severe aortic stenosis    s/p aortic valve replacement using 25 mm Edwards Magna-ease pericardial valve by Dr. Cyndia Bent during CABG 01/07/2016  . Tobacco abuse    Prior   Social History   Socioeconomic History  . Marital status: Divorced    Spouse name: Not on file  . Number of children: 3  . Years of education: GED  . Highest education level: Not on file  Occupational History  .  Occupation: Retired Development worker, community  Social Needs  . Financial resource strain: Not on file  . Food insecurity:    Worry: Not on file    Inability: Not on file  . Transportation needs:    Medical: Not on file    Non-medical: Not on file  Tobacco Use  . Smoking status: Former Smoker    Packs/day: 2.00    Years: 46.00    Pack years: 92.00    Last attempt to quit: 11/07/2008    Years since quitting: 9.3  . Smokeless tobacco: Never Used  . Tobacco comment: smoked 1-2 packs per day depending on drinking  Substance and Sexual Activity  . Alcohol use: No    Alcohol/week: 0.0 oz    Comment: quit 2004 - 12 pack per day for 30 years  . Drug use: No    Comment: quit Marijuana in 2014  . Sexual activity: Not on file  Lifestyle  . Physical activity:    Days per week: Not on file    Minutes per session: Not on file  . Stress: Not on file  Relationships  . Social connections:    Talks on phone: Not on file    Gets together: Not on file     Attends religious service: Not on file    Active member of club or organization: Not on file    Attends meetings of clubs or organizations: Not on file    Relationship status: Not on file  Other Topics Concern  . Not on file  Social History Narrative   Lives with son   Drinks one cup of coffee a day and one soda q2-3 days      [facility] (03/20/17):   Originally from [place], [place]. Previously worked as a Development worker, community. No pets currently. Remote cockatiel exposure. No mold or hot tub exposure. Currently living alone.    Family History  Problem Relation Age of Onset  . Stroke Mother   . Diabetes Father   . Stroke Father   . Kidney disease Father   . Emphysema Father   . Kidney disease Sister   . Kidney disease Brother   . Lung disease Brother        Rare lung disease w/ inflammation  . Heart attack Neg Hx   . Rheumatologic disease Neg Hx    Scheduled Meds: . apixaban  5 mg Oral BID  . aspirin EC  81 mg Oral Daily  . atorvastatin  80 mg Oral Daily  . ipratropium-albuterol  3 mL Nebulization TID  . lisinopril  2.5 mg Oral Daily  . mouth rinse  15 mL Mouth Rinse BID  . metoprolol tartrate  25 mg Oral BID   Continuous Infusions: PRN Meds:.acetaminophen **OR** acetaminophen, albuterol, bisacodyl, HYDROcodone-acetaminophen, nitroGLYCERIN, ondansetron **OR** ondansetron (ZOFRAN) IV, pneumococcal 23 valent vaccine, senna-docusate Medications Prior to Admission:  Prior to Admission medications   Medication Sig Start Date End Date Taking? Authorizing Provider  apixaban (ELIQUIS) 5 MG TABS tablet Take 1 tablet (5 mg total) by mouth 2 (two) times daily. 03/12/18  Yes Burnell Blanks, MD  aspirin 81 MG EC tablet Take 1 tablet (81 mg total) by mouth daily. 02/12/16  Yes Love, Ivan Anchors, PA-C  atorvastatin (LIPITOR) 80 MG tablet Take 1 tablet (80 mg total) by mouth daily. 03/12/18  Yes Burnell Blanks, MD  furosemide (LASIX) 40 MG tablet Take 1 tablet (40 mg  total) by mouth daily. Patient taking differently: Take 40 mg  by mouth every other day.  03/12/18  Yes Burnell Blanks, MD  metoprolol tartrate (LOPRESSOR) 25 MG tablet Take 0.5 tablets (12.5 mg total) by mouth 2 (two) times daily. 03/12/18  Yes Burnell Blanks, MD  nitroGLYCERIN (NITROSTAT) 0.4 MG SL tablet Place 1 tablet (0.4 mg total) under the tongue every 5 (five) minutes x 3 doses as needed for chest pain. 03/12/18  Yes Burnell Blanks, MD  OXYGEN Inhale 2 L into the lungs continuous.   Yes [provider]  potassium chloride SA (KLOR-CON M20) 20 MEQ tablet Take 1 tablet (20 mEq total) by mouth 2 (two) times daily. 03/12/18  Yes Burnell Blanks, MD   Allergies  Allergen Reactions  . No Known Allergies Other (See Comments)   Review of Systems  Constitutional: Positive for activity change.  Respiratory: Positive for shortness of breath.   Neurological: Positive for weakness.   Physical Exam  Constitutional: He is oriented to person, place, and time. He is cooperative. He appears ill.  HENT:  Head: Normocephalic and atraumatic.  Pulmonary/Chest: No accessory muscle usage. No tachypnea. No respiratory distress.  Neurological: He is alert and oriented to person, place, and time.  Skin: Skin is warm and dry.  Psychiatric: He has a normal mood and affect. His speech is normal and behavior is normal. Cognition and memory are normal.  Nursing note and vitals reviewed.  Vital Signs: BP 93/68 (BP Location: Left Arm)   Pulse 100   Temp (!) 97.5 F (36.4 C) (Oral)   Resp 18   Ht '5\' 9"'  (1.753 m)   Wt 56.8 kg (125 lb 3.5 oz)   SpO2 99%   BMI 18.49 kg/m  Pain Scale: 0-10   Pain Score: Asleep   SpO2: SpO2: 99 % O2 Device:SpO2: 99 % O2 Flow Rate: .O2 Flow Rate (L/min): 3 L/min  IO: Intake/output summary:   Intake/Output Summary (Last 24 hours) at 03/28/2018 0804 Last data filed at 03/27/2018 2042 Gross per 24 hour  Intake 120 ml  Output 525 ml    Net -405 ml    LBM: Last BM Date: 03/27/18 Baseline Weight: Weight: 58.4 kg (128 lb 12 oz) Most recent weight: Weight: 56.8 kg (125 lb 3.5 oz)     Palliative Assessment/Data: PPS 50%   Flowsheet Rows     Most Recent Value  Intake Tab  Referral Department  Hospitalist  Unit at Time of Referral  Med/Surg Unit  Palliative Care Primary Diagnosis  Pulmonary  Palliative Care Type  New Palliative care  Reason for referral  Clarify Goals of Care  Date first seen by Palliative Care  03/27/18  Clinical Assessment  Palliative Performance Scale Score  50%  Psychosocial & Spiritual Assessment  Palliative Care Outcomes  Patient/Family meeting held?  Yes  Who was at the meeting?  patient and sister via telephone  Palliative Care Outcomes  Clarified goals of care, Provided end of life care assistance, ACP counseling assistance, Provided psychosocial or spiritual support, Counseled regarding hospice      Time In: 1250 Time Out: 1400 Time Total: 63mn Greater than 50%  of this time was spent counseling and coordinating care related to the above assessment and plan.  Signed by:  MIhor Dow FNP-C Palliative Medicine Team  Phone: [phone]: [phone]  Please contact Palliative Medicine Team phone at [phone] questions and concerns.  For individual provider: See AShea Evans

## 2018-03-27 NOTE — Progress Notes (Signed)
Progress Note    Craig [name]  UEA:[id number] DOB: Apr 21, 1951  DOA: 03/26/2018 PCP: Rose Fillers, PA-C    Brief Narrative:    Medical records reviewed and are as summarized below:  Craig [name] is an 67 y.o. male with a Past Medical History of combined chronic heart failure; remote tobacco dependence with severe COPD on chronic 2L home O2; recurrent B effusions s/p PleurX catheters (removed in 11/17); afib; HTN; CAD s/p CABG; and HLD who presents with acute on chronic respiratory failure.  Patient reports several days of worsening SOB with exertion.   Assessment/Plan:   Principal Problem:   Acute on chronic respiratory failure with hypoxia (HCC) Active Problems:   COPD exacerbation (HCC)   History of hypertension   History of hyperlipidemia   CAD (coronary artery disease)   Pleural effusion, bilateral   S/P CABG (coronary artery bypass graft)   Paroxysmal atrial fibrillation (HCC)   COPD, severe (HCC)  Acute on chronic respiratory failure -wears 2L O2 at home  Acute exacerbation of systolic as well as diastolic heart failure -responding well to IV lasix -repeat echo pending -ACE started in ER-- watch BP  COPD -nebs -O2 -does not appear to be acute exacerbation-- hold on steroids  Atrial fibrillation -on eliquis -on BB-- may need to increase if HR continues to stay up  Elevated Troponin in a patient with history of CAD status post CABG, -no chest pain, likely due to demand ischemia  HTN -watch closely while diuresing   Discussed overall quality of life/plans for home -has considered ALF  -willing to discuss with palliative care- consult placed   Body mass index is 19.01 kg/m.   Family Communication/Anticipated D/C date and plan/Code Status   DVT prophylaxis: eliquis Code Status: Full Code.  Family Communication: none at bedside Disposition Plan:    Medical Consultants:    Palliative care    Subjective:   Still very short  of breath with exertion  Objective:    Vitals:   03/26/18 2027 03/26/18 2059 03/27/18 0552 03/27/18 1331  BP: (!) 145/96  109/75   Pulse: (!) 108  94   Resp: 18  18   Temp: 97.8 F (36.6 C)  97.7 F (36.5 C)   TempSrc: Oral  Oral   SpO2: 93% 94% 97% 96%  Weight:   58.4 kg (128 lb 12 oz)   Height:        Intake/Output Summary (Last 24 hours) at 03/27/2018 1443 Last data filed at 03/27/2018 1359 Gross per 24 hour  Intake -  Output 2550 ml  Net -2550 ml   Filed Weights   03/26/18 2020 03/27/18 0552  Weight: 58.4 kg (128 lb 12 oz) 58.4 kg (128 lb 12 oz)    Exam: Thin male, on 2L O2-- just back from the bathroom so appeared winded irr and fast No wheezing, working to breathe No LE edema Normal mood/affect  Data Reviewed:   I have personally reviewed following labs and imaging studies:  Labs: Labs show the following:   Basic Metabolic Panel: Recent Labs  Lab 03/26/18 1407 03/27/18 0510  NA 141 142  K 4.2 3.9  CL 106 104  CO2 28 31  GLUCOSE 137* 164*  BUN 21* 21*  CREATININE 1.01 1.14  CALCIUM 9.0 8.8*   GFR Estimated Creatinine Clearance: 52.7 mL/min (by C-G formula based on SCr of 1.14 mg/dL). Liver Function Tests: No results for input(s): AST, ALT, ALKPHOS, BILITOT, PROT, ALBUMIN in the last  168 hours. No results for input(s): LIPASE, AMYLASE in the last 168 hours. No results for input(s): AMMONIA in the last 168 hours. Coagulation profile No results for input(s): INR, PROTIME in the last 168 hours.  CBC: Recent Labs  Lab 03/26/18 1407 03/27/18 0510  WBC 4.6 5.2  HGB 11.7* 10.8*  HCT 37.9* 34.0*  MCV 102.4* 100.6*  PLT 137* 134*   Cardiac Enzymes: Recent Labs  Lab 03/26/18 1733 03/26/18 2204  TROPONINI 0.15* 0.18*   BNP (last 3 results) No results for input(s): PROBNP in the last 8760 hours. CBG: No results for input(s): GLUCAP in the last 168 hours. D-Dimer: No results for input(s): DDIMER in the last 72 hours. Hgb A1c: No  results for input(s): HGBA1C in the last 72 hours. Lipid Profile: No results for input(s): CHOL, HDL, LDLCALC, TRIG, CHOLHDL, LDLDIRECT in the last 72 hours. Thyroid function studies: No results for input(s): TSH, T4TOTAL, T3FREE, THYROIDAB in the last 72 hours.  Invalid input(s): FREET3 Anemia work up: No results for input(s): VITAMINB12, FOLATE, FERRITIN, TIBC, IRON, RETICCTPCT in the last 72 hours. Sepsis Labs: Recent Labs  Lab 03/26/18 1407 03/27/18 0510  WBC 4.6 5.2    Microbiology Recent Results (from the past 240 hour(s))  Culture, blood (routine x 2) Call MD if unable to obtain prior to antibiotics being given     Status: None (Preliminary result)   Collection Time: 03/26/18  5:40 PM  Result Value Ref Range Status   Specimen Description BLOOD LEFT ANTECUBITAL  Final   Special Requests   Final    BOTTLES DRAWN AEROBIC AND ANAEROBIC Blood Culture adequate volume   Culture   Final    NO GROWTH < 24 HOURS Performed at [facility], [address] [place], [place] [address]    Report Status PENDING  Incomplete  Culture, blood (routine x 2) Call MD if unable to obtain prior to antibiotics being given     Status: None (Preliminary result)   Collection Time: 03/26/18  5:50 PM  Result Value Ref Range Status   Specimen Description BLOOD RIGHT ANTECUBITAL  Final   Special Requests   Final    BOTTLES DRAWN AEROBIC AND ANAEROBIC Blood Culture adequate volume   Culture   Final    NO GROWTH < 24 HOURS Performed at [facility], [address], [place], [place] [address]    Report Status PENDING  Incomplete    Procedures and diagnostic studies:  Dg Chest 2 View  Result Date: 03/26/2018 CLINICAL DATA:  Shortness of breath, weakness x3 days EXAM: CHEST - 2 VIEW COMPARISON:  03/20/2017 FINDINGS: Mild patchy right lower lobe opacity, atelectasis versus pneumonia. Increased interstitial markings.  No frank interstitial edema. Small bilateral pleural effusions.  No  pneumothorax. Heart is top-normal in size. Prosthetic aortic valve. Postsurgical changes related to prior CABG. Median sternotomy. IMPRESSION: Mild patchy right lower lobe opacity, atelectasis versus pneumonia. Small bilateral pleural effusions.  No frank interstitial edema. Electronically Signed   By: Charline Bills M.D.   On: 03/26/2018 12:37    Medications:   . apixaban  5 mg Oral BID  . aspirin EC  81 mg Oral Daily  . atorvastatin  80 mg Oral Daily  . furosemide  40 mg Intravenous BID  . ipratropium-albuterol  3 mL Nebulization TID  . lisinopril  2.5 mg Oral Daily  . mouth rinse  15 mL Mouth Rinse BID  . metoprolol tartrate  12.5 mg Oral BID  Continuous Infusions:   LOS: 0 days   Joseph Art  [facility]   *Please refer to [email or web address], password [email or web address] to get updated schedule on who will round on this patient, as hospitalists switch teams weekly. If 7PM-7AM, please contact night-coverage at [email or web address], password [email or web address] for any overnight needs.  03/27/2018, 2:43 PM

## 2018-03-27 NOTE — Care Management Obs Status (Signed)
MEDICARE OBSERVATION STATUS NOTIFICATION   Patient Details  Name: Craig [name] MRN: [id number] Date of Birth: 08-02-1951   Medicare Observation Status Notification Given:  Yes    Lawerance Sabal, RN 03/27/2018, 3:39 PM

## 2018-03-27 NOTE — Progress Notes (Signed)
Les Pou. Scruton was admitted to 5w37 from the ED via stretcher.  The patient is alert and oriented x4, ambulatory but quickly becomes short of breath with exertion.  Admission booklet given.  Bed is in the lowest position, call bell and telephone are within reach.  Explained to patient how to use call bell and telephone, patient indicated understanding.  Will continue to monitor.

## 2018-03-27 NOTE — Progress Notes (Addendum)
PMT NP completed initial evaluation with patient at bedside. Continue FULL code/FULL scope for now. Will further discuss goals with patient and sister [name]) tomorrow, 5/22. Full note to follow.   NO CHARGE  Vennie Homans, FNP-C Palliative Medicine Team  Phone: [phone] Fax: [phone]

## 2018-03-27 NOTE — Progress Notes (Signed)
  Echocardiogram 2D Echocardiogram has been performed.  Danelia Snodgrass T Alek Poncedeleon 03/27/2018, 2:54 PM

## 2018-03-28 ENCOUNTER — Encounter (HOSPITAL_COMMUNITY): Payer: Self-pay

## 2018-03-28 DIAGNOSIS — J449 Chronic obstructive pulmonary disease, unspecified: Secondary | ICD-10-CM

## 2018-03-28 DIAGNOSIS — Z515 Encounter for palliative care: Secondary | ICD-10-CM

## 2018-03-28 DIAGNOSIS — I480 Paroxysmal atrial fibrillation: Secondary | ICD-10-CM

## 2018-03-28 DIAGNOSIS — M25511 Pain in right shoulder: Secondary | ICD-10-CM

## 2018-03-28 DIAGNOSIS — R0602 Shortness of breath: Secondary | ICD-10-CM

## 2018-03-28 LAB — URINE CULTURE: Culture: 60000 — AB

## 2018-03-28 LAB — CBC
HEMATOCRIT: 33.1 % — AB (ref 39.0–52.0)
HEMOGLOBIN: 10.2 g/dL — AB (ref 13.0–17.0)
MCH: 32.1 pg (ref 26.0–34.0)
MCHC: 30.8 g/dL (ref 30.0–36.0)
MCV: 104.1 fL — AB (ref 78.0–100.0)
Platelets: 139 10*3/uL — ABNORMAL LOW (ref 150–400)
RBC: 3.18 MIL/uL — ABNORMAL LOW (ref 4.22–5.81)
RDW: 15.7 % — AB (ref 11.5–15.5)
WBC: 6.8 10*3/uL (ref 4.0–10.5)

## 2018-03-28 LAB — BASIC METABOLIC PANEL
Anion gap: 9 (ref 5–15)
BUN: 30 mg/dL — AB (ref 6–20)
CALCIUM: 8.6 mg/dL — AB (ref 8.9–10.3)
CHLORIDE: 101 mmol/L (ref 101–111)
CO2: 31 mmol/L (ref 22–32)
Creatinine, Ser: 1.61 mg/dL — ABNORMAL HIGH (ref 0.61–1.24)
GFR, EST AFRICAN AMERICAN: 50 mL/min — AB (ref >60)
GFR, EST NON AFRICAN AMERICAN: 43 mL/min — AB (ref >60)
GLUCOSE: 96 mg/dL (ref 65–99)
Potassium: 4.1 mmol/L (ref 3.5–5.1)
Sodium: 141 mmol/L (ref 135–145)

## 2018-03-28 MED ORDER — APIXABAN 2.5 MG PO TABS
2.5000 mg | ORAL_TABLET | Freq: Two times a day (BID) | ORAL | Status: DC
  Administered 2018-03-28 – 2018-03-29 (×2): 2.5 mg via ORAL
  Filled 2018-03-28 (×2): qty 1

## 2018-03-28 MED ORDER — FUROSEMIDE 40 MG PO TABS
40.0000 mg | ORAL_TABLET | Freq: Every day | ORAL | Status: DC
  Administered 2018-03-29 – 2018-03-31 (×3): 40 mg via ORAL
  Filled 2018-03-28 (×3): qty 1

## 2018-03-28 MED ORDER — MAGNESIUM SULFATE 2 GM/50ML IV SOLN
2.0000 g | Freq: Once | INTRAVENOUS | Status: AC
  Administered 2018-03-28: 2 g via INTRAVENOUS
  Filled 2018-03-28: qty 50

## 2018-03-28 MED ORDER — METOPROLOL TARTRATE 25 MG PO TABS
25.0000 mg | ORAL_TABLET | Freq: Two times a day (BID) | ORAL | Status: DC
  Administered 2018-03-28 – 2018-03-31 (×7): 25 mg via ORAL
  Filled 2018-03-28 (×7): qty 1

## 2018-03-28 MED ORDER — LIDOCAINE 5 % EX PTCH
1.0000 | MEDICATED_PATCH | CUTANEOUS | Status: DC
  Administered 2018-03-28 – 2018-03-30 (×3): 1 via TRANSDERMAL
  Filled 2018-03-28 (×3): qty 1

## 2018-03-28 NOTE — Progress Notes (Signed)
CSW provided patient with food resources such as [employer] program.   CSW signing off.  Osborne Casco Hanadi Stanly LCSW [phone]

## 2018-03-28 NOTE — Progress Notes (Signed)
Progress Note    Craig [name]  ZOX:[id number] DOB: May 18, 1951  DOA: 03/26/2018 PCP: Rose Fillers, PA-C    Brief Narrative:    Medical records reviewed and are as summarized below:  Craig [name] is an 67 y.o. male with a Past Medical History of combined chronic heart failure; remote tobacco dependence with severe COPD on chronic 2L home O2; recurrent B effusions s/p PleurX catheters (removed in 11/17); afib; HTN; CAD s/p CABG; and HLD who presents with acute on chronic respiratory failure.  Patient reports several days of worsening SOB with exertion.  Responded well to IV diuresis  Assessment/Plan:   Principal Problem:   Acute on chronic respiratory failure with hypoxia (HCC) Active Problems:   COPD exacerbation (HCC)   History of hypertension   History of hyperlipidemia   CAD (coronary artery disease)   Pleural effusion, bilateral   S/P CABG (coronary artery bypass graft)   Paroxysmal atrial fibrillation (HCC)   COPD, severe (HCC)   Acute respiratory failure with hypoxia (HCC)  Acute on chronic respiratory failure -wears 2L O2 at home -has been weaned to home O2  Acute exacerbation of systolic as well as diastolic heart failure -responding well to IV lasix- down 2.5L-- change to PO lasix in AM (stop IV 5/22) -repeat echo with improved EF from 2017 -ACE started in ER-- watch BP  COPD -nebs -O2 -does not appear to be acute exacerbation-- hold on steroids -will need nebs/inhalers upon d/c ([facility])  parox Atrial fibrillation -on eliquis -will increase BB and monitor HR  Elevated Troponin in a patient with history of CAD status post CABG, -no chest pain, likely due to demand ischemia  HTN -on low side -monitor closely   Discussed overall quality of life/plans for home -palliative care consult -has trouble with ADLS   Body mass index is 81.19 kg/m.   Family Communication/Anticipated D/C date and plan/Code Status   DVT prophylaxis:  eliquis Code Status: Full Code.  Family Communication: none at bedside Disposition Plan:    Medical Consultants:    Palliative care    Subjective:   Feels like breathing improved overnight--- slept well  Objective:    Vitals:   03/27/18 1518 03/27/18 2042 03/28/18 0442 03/28/18 0500  BP:   Pulse: 97 86 100   Resp: 18     Temp: 97.9 F (36.6 C) 98.4 F (36.9 C) (!) 97.5 F (36.4 C)   TempSrc: Oral Oral Oral   SpO2: 92% 95% 99%   Weight:    56.8 kg (125 lb 3.5 oz)  Height:        Intake/Output Summary (Last 24 hours) at 03/28/2018 0828 Last data filed at 03/27/2018 2042 Gross per 24 hour  Intake 120 ml  Output 525 ml  Net -405 ml   Filed Weights   03/26/18 2020 03/27/18 0552 03/28/18 0500  Weight: 58.4 kg (128 lb 12 oz) 58.4 kg (128 lb 12 oz) 56.8 kg (125 lb 3.5 oz)    Exam: In bed, on home 2L O2 irr but rate controlled Moving more air, no wheezing or crackles No LE edema Pleasant and cooperative  Data Reviewed:   I have personally reviewed following labs and imaging studies:  Labs: Labs show the following:   Basic Metabolic Panel: Recent Labs  Lab 03/26/18 1407 03/27/18 0510 03/28/18 0501  NA 141 142 141  K 4.2 3.9 4.1  CL 106 104 101  CO2 GLUCOSE 137* 164*  96  BUN 21* 21* 30*  CREATININE 1.01 1.14 1.61*  CALCIUM 9.0 8.8* 8.6*   GFR Estimated Creatinine Clearance: 36.3 mL/min (A) (by C-G formula based on SCr of 1.61 mg/dL (H)). Liver Function Tests: No results for input(s): AST, ALT, ALKPHOS, BILITOT, PROT, ALBUMIN in the last 168 hours. No results for input(s): LIPASE, AMYLASE in the last 168 hours. No results for input(s): AMMONIA in the last 168 hours. Coagulation profile No results for input(s): INR, PROTIME in the last 168 hours.  CBC: Recent Labs  Lab 03/26/18 1407 03/27/18 0510 03/28/18 0501  WBC 4.6 5.2 6.8  HGB 11.7* 10.8* 10.2*  HCT 37.9* 34.0* 33.1*  MCV 102.4* 100.6* 104.1*  PLT 137*  134* 139*   Cardiac Enzymes: Recent Labs  Lab 03/26/18 1733 03/26/18 2204  TROPONINI 0.15* 0.18*   BNP (last 3 results) No results for input(s): PROBNP in the last 8760 hours. CBG: No results for input(s): GLUCAP in the last 168 hours. D-Dimer: No results for input(s): DDIMER in the last 72 hours. Hgb A1c: No results for input(s): HGBA1C in the last 72 hours. Lipid Profile: No results for input(s): CHOL, HDL, LDLCALC, TRIG, CHOLHDL, LDLDIRECT in the last 72 hours. Thyroid function studies: No results for input(s): TSH, T4TOTAL, T3FREE, THYROIDAB in the last 72 hours.  Invalid input(s): FREET3 Anemia work up: No results for input(s): VITAMINB12, FOLATE, FERRITIN, TIBC, IRON, RETICCTPCT in the last 72 hours. Sepsis Labs: Recent Labs  Lab 03/26/18 1407 03/27/18 0510 03/28/18 0501  WBC 4.6 5.2 6.8    Microbiology Recent Results (from the past 240 hour(s))  Culture, blood (routine x 2) Call MD if unable to obtain prior to antibiotics being given     Status: None (Preliminary result)   Collection Time: 03/26/18  5:40 PM  Result Value Ref Range Status   Specimen Description BLOOD LEFT ANTECUBITAL  Final   Special Requests   Final    BOTTLES DRAWN AEROBIC AND ANAEROBIC Blood Culture adequate volume   Culture   Final    NO GROWTH < 24 HOURS Performed at [facility], [address], [place], [place] [address]    Report Status PENDING  Incomplete  Culture, blood (routine x 2) Call MD if unable to obtain prior to antibiotics being given     Status: None (Preliminary result)   Collection Time: 03/26/18  5:50 PM  Result Value Ref Range Status   Specimen Description BLOOD RIGHT ANTECUBITAL  Final   Special Requests   Final    BOTTLES DRAWN AEROBIC AND ANAEROBIC Blood Culture adequate volume   Culture   Final    NO GROWTH < 24 HOURS Performed at [facility], [address], [place], [place] [address]    Report Status PENDING  Incomplete    Procedures and  diagnostic studies:  Dg Chest 2 View  Result Date: 03/26/2018 CLINICAL DATA:  Shortness of breath, weakness x3 days EXAM: CHEST - 2 VIEW COMPARISON:  03/20/2017 FINDINGS: Mild patchy right lower lobe opacity, atelectasis versus pneumonia. Increased interstitial markings.  No frank interstitial edema. Small bilateral pleural effusions.  No pneumothorax. Heart is top-normal in size. Prosthetic aortic valve. Postsurgical changes related to prior CABG. Median sternotomy. IMPRESSION: Mild patchy right lower lobe opacity, atelectasis versus pneumonia. Small bilateral pleural effusions.  No frank interstitial edema. Electronically Signed   By: Charline Bills M.D.   On: 03/26/2018 12:37    Medications:   . apixaban  5 mg Oral BID  .  aspirin EC  81 mg Oral Daily  . atorvastatin  80 mg Oral Daily  . [START ON 03/29/2018] furosemide  40 mg Oral Daily  . ipratropium-albuterol  3 mL Nebulization TID  . lisinopril  2.5 mg Oral Daily  . mouth rinse  15 mL Mouth Rinse BID  . metoprolol tartrate  25 mg Oral BID   Continuous Infusions: . magnesium sulfate 1 - 4 g bolus IVPB       LOS: 1 day   Joseph Art  [facility]   *Please refer to [email or web address], password [email or web address] to get updated schedule on who will round on this patient, as hospitalists switch teams weekly. If 7PM-7AM, please contact night-coverage at [email or web address], password [email or web address] for any overnight needs.  03/28/2018, 8:28 AM

## 2018-03-28 NOTE — Progress Notes (Signed)
Daily Progress Note   Patient Name: Craig [name]       Date: 03/28/2018 DOB: October 01, 1951  Age: 67 y.o. MRN#: [id number] Attending Physician: Joseph Art, DO Primary Care Physician: Teodoro Kil Admit Date: 03/26/2018  Reason for Consultation/Follow-up: Establishing goals of care  Subjective: Patient awake, alert, oriented. Sitting up in bed. Denies dyspnea but complains of right shoulder pain, relieved by prn Norco last night. RN instructed to give prn Norco.  GOC:  Since sister, [name], is unable to leave work this week, [name] and I discussed goals of care with her on speaker phone this afternoon. Again introduced palliative medicine. We discussed hospital diagnoses and interventions. Discussed disease trajectory of chronic, progressive COPD and CHF. Patient again speaks of the challenges at home due to increased dyspnea with exertion with minimal activity. Explained this likely being disease progression. [name] and [name] understand chronic, progressive nature of his heart and lung disease.   I attempted to elicit values and goals of care important to the patient. He is most worried about receiving his meals and medications. SW has provided patient with a list of resources for outpatient meals, including meals on wheels. He wishes to remain home but would be willing to pursue assisted living if necessary. He speaks of support from his son with groceries and cleaning.   Advanced directives, concepts specific to code status, artifical feeding and hydration, and rehospitalization were considered and discussed. Attempted to explore patient's thoughts regarding heroic measures at EOL. [name] tells me to go by whatever the advanced directive says. Patient has [place] AD packet  completed and has initialed. Patient can clearly tell [name] and I that he does not want anything "down his throat." We discussed this meaning DO NOT INTUBATE. Educated on MOST form to help guide decision making. Patient cannot make a decision today regarding resuscitation. He does confirm that he would NOT want to live in a vegetative state with poor quality of life if he was not getting better. [name] wishes to hold off on completing MOST form until they review and discuss together.   The difference between aggressive medical intervention and comfort care was considered in light of the patient's goals of care.   Palliative Care and hospice services outpatient were explained and offered. At this time, patient and sister  are hopeful that he can remain home with the support of hospice services on discharge. We did discuss hospice philosophy with goal of comfort, quality, and dignity. Also role of using medications for symptom management with progressive lung/heart disease. Patient and sister agreeable with plan for hospice on discharge. [name] speaks highly of hospice services with her mother at EOL.   Questions and concerns were addressed. Hard Choices, MOST form, and PMT contact information left for patient to give to [name] when she is available to visit the hospital.  Length of Stay: 1  Current Medications: Scheduled Meds:  . apixaban  2.5 mg Oral BID  . aspirin EC  81 mg Oral Daily  . atorvastatin  80 mg Oral Daily  . [START ON 03/29/2018] furosemide  40 mg Oral Daily  . ipratropium-albuterol  3 mL Nebulization TID  . lidocaine  1 patch Transdermal Q24H  . mouth rinse  15 mL Mouth Rinse BID  . metoprolol tartrate  25 mg Oral BID    Continuous Infusions:  PRN Meds: acetaminophen **OR** acetaminophen, albuterol, bisacodyl, HYDROcodone-acetaminophen, nitroGLYCERIN, ondansetron **OR** ondansetron (ZOFRAN) IV, pneumococcal 23 valent vaccine, senna-docusate  Physical Exam  Constitutional: He is  oriented to person, place, and time. He is cooperative. He appears ill.  C/o right shoulder pain  HENT:  Head: Normocephalic and atraumatic.  Pulmonary/Chest: No accessory muscle usage. No tachypnea. No respiratory distress.  Intermittent dyspnea at rest  Neurological: He is alert and oriented to person, place, and time.  Skin: Skin is warm and dry.  Psychiatric: He has a normal mood and affect. His speech is normal and behavior is normal. Cognition and memory are normal.  Nursing note and vitals reviewed.          Vital Signs: BP 99/71 (BP Location: Left Arm)   Pulse 94   Temp (!) 97.5 F (36.4 C) (Oral)   Resp 18   Ht  (1.753 m)   Wt 56.8 kg (125 lb 3.5 oz)   SpO2 (!) 87%   BMI 18.49 kg/m  SpO2: SpO2: (!) 87 % O2 Device: O2 Device: Nasal Cannula O2 Flow Rate: O2 Flow Rate (L/min): 3 L/min  Intake/output summary:   Intake/Output Summary (Last 24 hours) at 03/28/2018 1316 Last data filed at 03/27/2018 2042 Gross per 24 hour  Intake 120 ml  Output 325 ml  Net -205 ml   LBM: Last BM Date: 03/27/18 Baseline Weight: Weight: 58.4 kg (128 lb 12 oz) Most recent weight: Weight: 56.8 kg (125 lb 3.5 oz)       Palliative Assessment/Data: PPS 50%   Flowsheet Rows     Most Recent Value  Intake Tab  Referral Department  Hospitalist  Unit at Time of Referral  Med/Surg Unit  Palliative Care Primary Diagnosis  Pulmonary  Date Notified  03/27/18  Palliative Care Type  New Palliative care  Reason for referral  Clarify Goals of Care  Date of Admission  03/26/18  Date first seen by Palliative Care  03/27/18  # of days Palliative referral response time  0 Day(s)  # of days IP prior to Palliative referral  1  Clinical Assessment  Palliative Performance Scale Score  50%  Psychosocial & Spiritual Assessment  Palliative Care Outcomes  Patient/Family meeting held?  Yes  Who was at the meeting?  patient and sister via telephone  Palliative Care Outcomes  Clarified goals of care,  Provided end of life care assistance, ACP counseling assistance, Provided psychosocial or spiritual support, Counseled regarding  hospice      Patient Active Problem List   Diagnosis Date Noted  . Palliative care by specialist   . Acute respiratory failure with hypoxia (HCC) 03/27/2018  . Acute on chronic respiratory failure with hypoxia (HCC) 03/26/2018  . Chronic respiratory failure with hypoxia (HCC) 03/20/2017  . Hemoptysis 03/20/2017  . Dyspnea 05/06/2016  . Goals of care, counseling/discussion 02/15/2016  . Supplemental oxygen dependent   . Peripheral edema   . Hyperkalemia   . Hypoalbuminemia due to protein-calorie malnutrition (HCC)   . Debility 02/03/2016  . History of sepsis   . History of non-ST elevation myocardial infarction (NSTEMI)   . Coronary artery disease involving coronary bypass graft of native heart without angina pectoris   . History of aortic aneurysm repair   . COPD, severe (HCC)   . Leukocytosis   . Hyponatremia   . Thrombocytopenia (HCC)   . Acute blood loss anemia   . Sepsis (HCC)   . S/P CABG (coronary artery bypass graft)   . Paroxysmal atrial fibrillation (HCC)   . S/P CABG x 4   . Pleural effusion, bilateral   . Severe aortic stenosis   . Coronary artery disease involving native coronary artery of native heart without angina pectoris   . Coronary artery disease involving native coronary artery of native heart with unstable angina pectoris (HCC)   . CAD (coronary artery disease)   . Aortic stenosis   . History of hypertension   . History of hyperlipidemia   . Ascending aortic aneurysm (HCC)   . COPD (chronic obstructive pulmonary disease) (HCC) 12/26/2015  . Hypoxia 12/26/2015  . COPD exacerbation (HCC) 02/17/2015  . Ruptured lumbar disc 02/17/2015  . Back pain 02/17/2015    Palliative Care Assessment & Plan   Patient Profile: 67 y.o. male  with past medical history of COPD on home oxygen, CHF, CAD, CABG, recurrent pleural effusions s/p  pleurodesis and pleurx catheters (removed 09/2016), afib, HTN, HLD, aoritic stenosis, and smoker admitted on 03/26/2018 with shortness of breath and cough. In ED, chest xray revealed atelectasis versus pneumonia with small bilateral pleural effusions. BNP 1084. Acute on chronic respiratory failure secondary to CHF exacerbation and with underlying COPD. Receiving nebs and lasix BID. Palliative medicine consultation for goals of care.   Assessment: Acute on chronic respiratory failure with hypoxia CHF exacerbation Severe COPD on home oxygen Bilateral pleural effusions CAD s/p CABG Dyspnea  Recommendations/Plan:  Follow-up with patient and sister via telephone.   MOST form discussed. Patient verbalizes he does NOT want intubation. He cannot make a decision today regarding resuscitation. He does speak of his wishes against prolonged interventions if he was in a "vegetative" state or not getting better. Sister and patient wish to further review form and discuss when together.   Palliative versus hospice services discussed. Patient/sister are hopeful that patient can return home and stay home with support of hospice services and symptom management. RN CM notified.   MOST form and Hard Choices copy left at bedside for sister. Encouraged patient/sister to continue conversations regarding heroic measures at EOL with underlying progressive COPD and CHF.   Norco prn pain. Lidoderm patch to right shoulder.   May benefit from home nebulizer machine and/or low-dose Roxanol for dyspnea at home.   PMT will follow. Updated Dr. Benjamine Mola.    Code Status: FULL   Code Status Orders  (From admission, onward)        Start     Ordered   03/26/18 1555  Full code  Continuous     03/26/18 1557    Code Status History    Date Active Date Inactive Code Status Order ID Comments User Context   05/06/2016 1057 05/12/2016 1754 Full Code [id number]  Kathleene Hazel, MD Inpatient   02/03/2016 1944 02/12/2016 1955  Full Code [id number]  Jerene Pitch Inpatient   12/27/2015 0057 02/03/2016 1943 Full Code [id number]  Ron Parker, MD Inpatient    Advance Directive Documentation     Most Recent Value  Type of Advance Directive  -- [pt is unsure which he has]  Pre-existing out of facility DNR order (yellow form or pink MOST form)  -  "MOST" Form in Place?  -       Prognosis:   Unable to determine guarded with acute on chronic respiratory failure secondary to progressive COPD and underlying heart failure. Functional status decline. Need for symptom management with hospice services.   Discharge Planning:  Home with Hospice  Care plan was discussed with patient, sister [name]), Dr. Benjamine Mola, RN CM  Thank you for allowing the Palliative Medicine Team to assist in the care of this patient.   Time In: 1215 Time Out: 1320 Total Time Prolonged Time Billed no       Greater than 50%  of this time was spent counseling and coordinating care related to the above assessment and plan.  Vennie Homans, FNP-C Palliative Medicine Team  Phone: [phone] Fax: [phone]  Please contact Palliative Medicine Team phone at [phone] for questions and concerns.

## 2018-03-28 NOTE — Progress Notes (Addendum)
[facility] Liaison: RN visit  Notified by Gae Gallop, [facility] of patient/family request for [facility] services at home after discharge. Chart and patient information reviewed by [facility] physician. Hospice eligibility approved by Dr. Jamie Brookes.  Writer spoke with  Patient at bedside to initiate education related to hospice philosophy, services and team approach to care. Patient verbalized understanding of information given. Per discussion, plan is for discharge to home by private vehicle, date not determined.  Please send signed and completed DNR form home with patient/family. Patient will need prescriptions for discharge comfort medications.  DME needs have been discussed, patient currently has the following equipment in the home: O2, [employer] oxygen, walker, cane, toilet lift seat.  Patient/family requests the following DME for delivery to the home: none.   [facility] aware of the above. Please notify [facility] when patient is ready to leave the unit at discharge. (Call [phone] or [phone] after 5pm.) [facility] information and contact numbers given to patient at time of visit. Above information shared with Gae Gallop, CMRN.  Please call with any hospice related questions.  Thank you for this referral.  Elsie Saas, RN, [facility] Liaison [phone] ? [facility] liaisons are now on AMION.

## 2018-03-28 NOTE — Discharge Instructions (Addendum)
Chronic Obstructive Pulmonary Disease Chronic obstructive pulmonary disease (COPD) is a long-term (chronic) lung problem. When you have COPD, it is hard for air to get in and out of your lungs. The way your lungs work will never return to normal. Usually the condition gets worse over time. There are things you can do to keep yourself as healthy as possible. Your doctor may treat your condition with:  Medicines.  Quitting smoking, if you smoke.  Rehabilitation. This may involve a team of specialists.  Oxygen.  Exercise and changes to your diet.  Lung surgery.  Comfort measures (palliative care).  Follow these instructions at home: Medicines  Take over-the-counter and prescription medicines only as told by your doctor.  Talk to your doctor before taking any cough or allergy medicines. You may need to avoid medicines that cause your lungs to be dry. Lifestyle  If you smoke, stop. Smoking makes the problem worse. If you need help quitting, ask your doctor.  Avoid being around things that make your breathing worse. This may include smoke, chemicals, and fumes.  Stay active, but remember to also rest.  Learn and use tips on how to relax.  Make sure you get enough sleep. Most adults need at least 7 hours a night.  Eat healthy foods. Eat smaller meals more often. Rest before meals. Controlled breathing  Learn and use tips on how to control your breathing as told by your doctor. Try: ? Breathing in (inhaling) through your nose for 1 second. Then, pucker your lips and breath out (exhale) through your lips for 2 seconds. ? Putting one hand on your belly (abdomen). Breathe in slowly through your nose for 1 second. Your hand on your belly should move out. Pucker your lips and breathe out slowly through your lips. Your hand on your belly should move in as you breathe out. Controlled coughing  Learn and use controlled coughing to clear mucus from your lungs. The steps are: 1. Lean  your head a little forward. 2. Breathe in deeply. 3. Try to hold your breath for 3 seconds. 4. Keep your mouth slightly open while coughing 2 times. 5. Spit any mucus out into a tissue. 6. Rest and do the steps again 1 or 2 times as needed. General instructions  Make sure you get all the shots (vaccines) that your doctor recommends. Ask your doctor about a flu shot and a pneumonia shot.  Use oxygen therapy and therapy to help improve your lungs (pulmonary rehabilitation) if told by your doctor. If you need home oxygen therapy, ask your doctor if you should buy a tool to measure your oxygen level (oximeter).  Make a COPD action plan with your doctor. This helps you know what to do if you feel worse than usual.  Manage any other conditions you have as told by your doctor.  Avoid going outside when it is very hot, cold, or humid.  Avoid people who have a sickness you can catch (contagious).  Keep all follow-up visits as told by your doctor. This is important. Contact a doctor if:  You cough up more mucus than usual.  There is a change in the color or thickness of the mucus.  It is harder to breathe than usual.  Your breathing is faster than usual.  You have trouble sleeping.  You need to use your medicines more often than usual.  You have trouble doing your normal activities such as getting dressed or walking around the house. Get help right away  if:  You have shortness of breath while resting.  You have shortness of breath that stops you from: ? Being able to talk. ? Doing normal activities.  Your chest hurts for longer than 5 minutes.  Your skin color is more blue than usual.  Your pulse oximeter shows that you have low oxygen for longer than 5 minutes.  You have a fever.  You feel too tired to breathe normally. Summary  Chronic obstructive pulmonary disease (COPD) is a long-term lung problem.  The way your lungs work will never return to normal. Usually the  condition gets worse over time. There are things you can do to keep yourself as healthy as possible.  Take over-the-counter and prescription medicines only as told by your doctor.  If you smoke, stop. Smoking makes the problem worse. This information is not intended to replace advice given to you by your health care provider. Make sure you discuss any questions you have with your health care provider. Document Released: 04/11/2008 Document Revised: 03/31/2016 Document Reviewed: 06/20/2013 Elsevier Interactive Patient Education  2017 [employer].  This information is not intended to replace advice given to you by your health care provider. Make sure you discuss any questions you have with your health care provider. Document Released: 10/13/2011 Document Revised: 03/31/2016 Document Reviewed: 06/28/2013 Elsevier Interactive Patient Education  2017 [employer]. Information on my medicine - ELIQUIS (apixaban)  This medication education was reviewed with me or my healthcare representative as part of my discharge preparation.  The pharmacist that spoke with me during my hospital stay was:  Lennon Alstrom, [facility]  Why was Eliquis prescribed for you? Eliquis was prescribed for you to reduce the risk of a blood clot forming that can cause a stroke if you have a medical condition called atrial fibrillation (a type of irregular heartbeat).  What do You need to know about Eliquis ? Take your Eliquis TWICE DAILY - one tablet in the morning and one tablet in the evening with or without food. If you have difficulty swallowing the tablet whole please discuss with your pharmacist how to take the medication safely.  Take Eliquis exactly as prescribed by your doctor and DO NOT stop taking Eliquis without talking to the doctor who prescribed the medication.  Stopping may increase your risk of developing a stroke.  Refill your prescription before you run out.  After discharge, you should have regular  check-up appointments with your healthcare provider that is prescribing your Eliquis.  In the future your dose may need to be changed if your kidney function or weight changes by a significant amount or as you get older.  What do you do if you miss a dose? If you miss a dose, take it as soon as you remember on the same day and resume taking twice daily.  Do not take more than one dose of ELIQUIS at the same time to make up a missed dose.  Important Safety Information A possible side effect of Eliquis is bleeding. You should call your healthcare provider right away if you experience any of the following: ? Bleeding from an injury or your nose that does not stop. ? Unusual colored urine (red or dark brown) or unusual colored stools (red or black). ? Unusual bruising for unknown reasons. ? A serious fall or if you hit your head (even if there is no bleeding).  Some medicines may interact with Eliquis and might increase your risk of bleeding or clotting while on Eliquis. To  help avoid this, consult your healthcare provider or pharmacist prior to using any new prescription or non-prescription medications, including herbals, vitamins, non-steroidal anti-inflammatory drugs (NSAIDs) and supplements.  This website has more information on Eliquis (apixaban): http://www.eliquis.com/eliquis/home

## 2018-03-28 NOTE — Progress Notes (Signed)
NCM received consult:Patient/sister interested in hospice services at home on discharge. NCM spoke with pt and sister and confirmed pt would to discharge with home hospice care. NCM offered choice. [facility] [place] [facility]. NCM made referral to [facility] for home hospice with Chales Abrahams @ [phone]. Gae Gallop RN,BSN,CM

## 2018-03-28 NOTE — Evaluation (Signed)
Physical Therapy Evaluation Patient Details Name: Craig [name] MRN: [id number] DOB: [birth year] Today's Date: 03/28/2018   History of Present Illness  Craig [name] is an 67 y.o. male with a Past Medical History of combined chronic heart failure; remote tobacco dependence with severe COPD on chronic 2L home O2; recurrent B effusions s/p PleurX catheters (removed in 11/17); afib; HTN; CAD s/p CABG; and HLD who presents with acute on chronic respiratory failure.  Patient reports several days of worsening SOB with exertion.  Clinical Impression   Pt admitted with above diagnosis. Pt currently with functional limitations due to the deficits listed below (see PT Problem List). Presents with significantly decr functional capacity, effecting mobility, amb, and ADLs; Pt will benefit from skilled PT to increase their independence and safety with mobility to allow discharge to the venue listed below.       Follow Up Recommendations Home health PT(Noted plan is for home with hospice; if HHPT/OT is included with Hospices services, I believe he could benefit)    Equipment Recommendations  Other (comment)(4 wheeled RW unless he already has one)    Recommendations for Other Services OT consult(ordered per protocol)     Precautions / Restrictions Precautions Precautions: Other (comment) Precaution Comments: Significant DOE      Mobility  Bed Mobility Overal bed mobility: Needs Assistance Bed Mobility: Supine to Sit;Sit to Supine     Supine to sit: Supervision Sit to supine: Supervision   General bed mobility comments: Supervision for safety  Transfers Overall transfer level: Needs assistance Equipment used: None Transfers: Sit to/from Stand Sit to Stand: Min guard         General transfer comment: Overall good power up; but pt has poor functional capacity, decr endurance, and could not stand long  Ambulation/Gait             General Gait Details: Did not pursue in room  ambulation due to SOB, decr endurance  Stairs            Wheelchair Mobility    Modified Rankin (Stroke Patients Only)       Balance Overall balance assessment: Mild deficits observed, not formally tested                                           Pertinent Vitals/Pain Pain Assessment: No/denies pain    Home Living Family/patient expects to be discharged to:: Private residence Living Arrangements: Alone Available Help at Discharge: Family;Friend(s);Available PRN/intermittently Type of Home: House Home Access: Stairs to enter   Entrance Stairs-Number of Steps: 1 Home Layout: One level Home Equipment: Walker - 4 wheels;Cane - single point(supplemental O2)      Prior Function Level of Independence: Independent with assistive device(s)   Gait / Transfers Assistance Needed: typically no assistive device, but cane, RW prn  ADL's / Homemaking Assistance Needed: sponge bathes, tells me he is exhausted after that; he goes to his sister's home about once a month for a tub bath; tells me he has a walk-in shower that can't fit a shower seat        Hand Dominance        Extremity/Trunk Assessment   Upper Extremity Assessment Upper Extremity Assessment: Defer to OT evaluation    Lower Extremity Assessment Lower Extremity Assessment: Generalized weakness(significant decr muscle endurance)       Communication   Communication: No difficulties(though get  SOB VERY easily)  Cognition Arousal/Alertness: Awake/alert Behavior During Therapy: WFL for tasks assessed/performed Overall Cognitive Status: Within Functional Limits for tasks assessed                                        General Comments General comments (skin integrity, edema, etc.): Session conducted on supplemental O2, 2.5 liters via [place]; O2 sats decr to 85% with minimal OOB activity; He self-monitors for activity tolerance very well    Exercises     Assessment/Plan     PT Assessment Patient needs continued PT services  PT Problem List Decreased activity tolerance;Decreased balance;Decreased knowledge of use of DME;Decreased knowledge of precautions;Cardiopulmonary status limiting activity       PT Treatment Interventions DME instruction;Gait training;Functional mobility training;Therapeutic activities;Therapeutic exercise;Patient/family education    PT Goals (Current goals can be found in the Care Plan section)  Acute Rehab PT Goals Patient Stated Goal: Hopes to get home soon PT Goal Formulation: With patient Time For Goal Achievement: 04/11/18 Potential to Achieve Goals: Good    Frequency Min 3X/week   Barriers to discharge Decreased caregiver support lives alone    Co-evaluation               AM-PAC PT "6 Clicks" Daily Activity  Outcome Measure Difficulty turning over in bed (including adjusting bedclothes, sheets and blankets)?: A Little Difficulty moving from lying on back to sitting on the side of the bed? : A Little Difficulty sitting down on and standing up from a chair with arms (e.g., wheelchair, bedside commode, etc,.)?: A Little Help needed moving to and from a bed to chair (including a wheelchair)?: A Little Help needed walking in hospital room?: A Little Help needed climbing 3-5 steps with a railing? : Total 6 Click Score: 16    End of Session Equipment Utilized During Treatment: Oxygen Activity Tolerance: Patient limited by fatigue Patient left: in bed;with call bell/phone within reach Nurse Communication: Mobility status PT Visit Diagnosis: Other abnormalities of gait and mobility (R26.89);Other (comment)(Decr functional capacity)    Time: 1610-9604 PT Time Calculation (min) (ACUTE ONLY): 20 min   Charges:   PT Evaluation $PT Eval Moderate Complexity: 1 Mod     PT G Codes:        Van Clines, PT  Acute Rehabilitation Services Pager [phone] Office [phone]   Levi Aland 03/28/2018, 4:35  PM

## 2018-03-29 DIAGNOSIS — Z8679 Personal history of other diseases of the circulatory system: Secondary | ICD-10-CM

## 2018-03-29 LAB — BASIC METABOLIC PANEL
ANION GAP: 8 (ref 5–15)
BUN: 34 mg/dL — ABNORMAL HIGH (ref 6–20)
CALCIUM: 8.4 mg/dL — AB (ref 8.9–10.3)
CO2: 30 mmol/L (ref 22–32)
Chloride: 100 mmol/L — ABNORMAL LOW (ref 101–111)
Creatinine, Ser: 1.23 mg/dL (ref 0.61–1.24)
GFR calc Af Amer: >60 mL/min (ref >60)
GFR, EST NON AFRICAN AMERICAN: 59 mL/min — AB (ref >60)
Glucose, Bld: 106 mg/dL — ABNORMAL HIGH (ref 65–99)
POTASSIUM: 4.3 mmol/L (ref 3.5–5.1)
SODIUM: 138 mmol/L (ref 135–145)

## 2018-03-29 LAB — EXPECTORATED SPUTUM ASSESSMENT W REFEX TO RESP CULTURE

## 2018-03-29 LAB — EXPECTORATED SPUTUM ASSESSMENT W GRAM STAIN, RFLX TO RESP C

## 2018-03-29 MED ORDER — APIXABAN 5 MG PO TABS
5.0000 mg | ORAL_TABLET | Freq: Two times a day (BID) | ORAL | Status: DC
  Administered 2018-03-29 – 2018-03-31 (×4): 5 mg via ORAL
  Filled 2018-03-29 (×4): qty 1

## 2018-03-29 MED ORDER — APIXABAN 2.5 MG PO TABS
2.5000 mg | ORAL_TABLET | ORAL | Status: AC
  Administered 2018-03-29: 2.5 mg via ORAL
  Filled 2018-03-29: qty 1

## 2018-03-29 NOTE — Consult Note (Signed)
[facility] CM Primary Care Navigator  03/29/2018  [place]bins Dec 03, 1950 [id number]   Went to see patient to identify possible discharge needs but noted MD note stating that family met with palliative care and plan is home with Hospice in am.  Per Inpatient CM note, Patient and family opted [facility]) for hospice services.  Noted no further health management needs at this point.     For additional questions please contact:  Edwena Felty A. Maricia Scotti, BSN, RN-BC [facility] PRIMARY CARE Navigator Cell: [phone]

## 2018-03-29 NOTE — Progress Notes (Signed)
Progress Note    Craig [name]  HWT:[id number] DOB: [birth year]  DOA: 03/26/2018 PCP: Ysidro Evert, PA-C    Brief Narrative:    Medical records reviewed and are as summarized below:  Craig [name] is an 67 y.o. male with a Past Medical History of combined chronic heart failure; remote tobacco dependence with severe COPD on chronic 2L home O2; recurrent B effusions s/p PleurX catheters (removed in 11/17); afib; HTN; CAD s/p CABG; and HLD who presents with acute on chronic respiratory failure.  Patient reports several days of worsening SOB with exertion.  Responded well to IV diuresis but still SOB.  Family met with palliative care and plan is home with hospice in AM.  Will need nebulizer and nebs.  Assessment/Plan:   Principal Problem:   Acute on chronic respiratory failure with hypoxia (HCC) Active Problems:   COPD exacerbation (HCC)   History of hypertension   History of hyperlipidemia   CAD (coronary artery disease)   Pleural effusion, bilateral   S/P CABG (coronary artery bypass graft)   Paroxysmal atrial fibrillation (HCC)   COPD, severe (HCC)   Goals of care, counseling/discussion   Acute respiratory failure with hypoxia ([place])   Palliative care by specialist   Acute pain of right shoulder  Acute on chronic respiratory failure -wears 2L O2 at home -has been weaned to home O2 -sputum culture pending  Acute exacerbation of systolic as well as diastolic heart failure -responding well to IV lasix- down 2.5L-- changed to PO lasix and will need BMP in the AM for monitoring -repeat echo with improved EF from 2017 -ACE d/c'd due to AKI  AKI -suspect due to overdiuresis -recheck BMP in AM  COPD -nebs -O2 -does not appear to be acute exacerbation-- hold on steroids -will need nebs upon d/c   parox Atrial fibrillation -on eliquis -increased BB and monitor HR  Elevated Troponin in a patient with history of CAD status post CABG, -no chest pain, likely  due to demand ischemia  HTN -monitor BB   Plan is home in AM with hospice   Body mass index is 18.49 kg/m.   Family Communication/Anticipated D/C date and plan/Code Status   DVT prophylaxis: eliquis Code Status: Full Code.  Family Communication: none at bedside Disposition Plan: home with hospice in AM   Medical Consultants:    Palliative care    Subjective:   Feels better after neb treatment  Objective:    Vitals:   03/29/18 0500 03/29/18 0553 03/29/18 0835 03/29/18 1005  BP:  108/70  110/69  Pulse:  67  80  Resp:  18    Temp:  97.7 F (36.5 C)    TempSrc:  Oral    SpO2:  99% 91%   Weight: 56.8 kg (125 lb 3.5 oz)     Height:        Intake/Output Summary (Last 24 hours) at 03/29/2018 1248 Last data filed at 03/29/2018 9179 Gross per 24 hour  Intake 100 ml  Output 200 ml  Net -100 ml   Filed Weights   03/27/18 0552 03/28/18 0500 03/29/18 0500  Weight: 58.4 kg (128 lb 12 oz) 56.8 kg (125 lb 3.5 oz) 56.8 kg (125 lb 3.5 oz)    Exam: No LE edema just finished neb-- improved air movement-- no wheezing Mood and affect appropriate  Data Reviewed:   I have personally reviewed following labs and imaging studies:  Labs: Labs show the following:   Basic Metabolic Panel:  Recent Labs  Lab 03/26/18 1407 03/27/18 0510 03/28/18 0501 03/29/18 0454  NA 141 142 141 138  K 4.2 3.9 4.1 4.3  CL 106 104 101 100*  CO2 '28 31 31 30  ' GLUCOSE 137* 164* 96 106*  BUN 21* 21* 30* 34*  CREATININE 1.01 1.14 1.61* 1.23  CALCIUM 9.0 8.8* 8.6* 8.4*   GFR Estimated Creatinine Clearance: 47.5 mL/min (by C-G formula based on SCr of 1.23 mg/dL). Liver Function Tests: No results for input(s): AST, ALT, ALKPHOS, BILITOT, PROT, ALBUMIN in the last 168 hours. No results for input(s): LIPASE, AMYLASE in the last 168 hours. No results for input(s): AMMONIA in the last 168 hours. Coagulation profile No results for input(s): INR, PROTIME in the last 168  hours.  CBC: Recent Labs  Lab 03/26/18 1407 03/27/18 0510 03/28/18 0501  WBC 4.6 5.2 6.8  HGB 11.7* 10.8* 10.2*  HCT 37.9* 34.0* 33.1*  MCV 102.4* 100.6* 104.1*  PLT 137* 134* 139*   Cardiac Enzymes: Recent Labs  Lab 03/26/18 1733 03/26/18 2204  TROPONINI 0.15* 0.18*   BNP (last 3 results) No results for input(s): PROBNP in the last 8760 hours. CBG: No results for input(s): GLUCAP in the last 168 hours. D-Dimer: No results for input(s): DDIMER in the last 72 hours. Hgb A1c: No results for input(s): HGBA1C in the last 72 hours. Lipid Profile: No results for input(s): CHOL, HDL, LDLCALC, TRIG, CHOLHDL, LDLDIRECT in the last 72 hours. Thyroid function studies: No results for input(s): TSH, T4TOTAL, T3FREE, THYROIDAB in the last 72 hours.  Invalid input(s): FREET3 Anemia work up: No results for input(s): VITAMINB12, FOLATE, FERRITIN, TIBC, IRON, RETICCTPCT in the last 72 hours. Sepsis Labs: Recent Labs  Lab 03/26/18 1407 03/27/18 0510 03/28/18 0501  WBC 4.6 5.2 6.8    Microbiology Recent Results (from the past 240 hour(s))  Culture, blood (routine x 2) Call MD if unable to obtain prior to antibiotics being given     Status: None (Preliminary result)   Collection Time: 03/26/18  5:40 PM  Result Value Ref Range Status   Specimen Description BLOOD LEFT ANTECUBITAL  Final   Special Requests   Final    BOTTLES DRAWN AEROBIC AND ANAEROBIC Blood Culture adequate volume   Culture   Final    NO GROWTH 3 DAYS Performed at [facility], [place] [address], [place], [place] [address]    Report Status PENDING  Incomplete  Culture, blood (routine x 2) Call MD if unable to obtain prior to antibiotics being given     Status: None (Preliminary result)   Collection Time: 03/26/18  5:50 PM  Result Value Ref Range Status   Specimen Description BLOOD RIGHT ANTECUBITAL  Final   Special Requests   Final    BOTTLES DRAWN AEROBIC AND ANAEROBIC Blood Culture adequate volume    Culture   Final    NO GROWTH 3 DAYS Performed at [facility], [place] [address] [place], [place] [address]    Report Status PENDING  Incomplete  Urine culture     Status: Abnormal   Collection Time: 03/26/18  7:35 PM  Result Value Ref Range Status   Specimen Description URINE, RANDOM  Final   Special Requests NONE  Final   Culture (A)  Final    60,000 COLONIES/mL CORYNEBACTERIUM SPECIES Standardized susceptibility testing for this organism is not available. Performed at [facility], [place] [address], [place], [place] [address]    Report Status 03/28/2018 FINAL  Final  Culture, sputum-assessment     Status: None   Collection Time: 03/29/18  5:59 AM  Result Value Ref Range Status   Specimen Description EXPECTORATED SPUTUM  Final   Special Requests NONE  Final   Sputum evaluation   Final    THIS SPECIMEN IS ACCEPTABLE FOR SPUTUM CULTURE Performed at [facility], [address], [place], [place] [address]    Report Status 03/29/2018 FINAL  Final  Culture, respiratory (NON-Expectorated)     Status: None (Preliminary result)   Collection Time: 03/29/18  5:59 AM  Result Value Ref Range Status   Specimen Description EXPECTORATED SPUTUM  Final   Special Requests NONE Reflexed from [id number]  Final   Gram Stain   Final    MODERATE WBC PRESENT, PREDOMINANTLY PMN FEW SQUAMOUS EPITHELIAL CELLS PRESENT ABUNDANT GRAM POSITIVE COCCI MODERATE GRAM VARIABLE ROD Performed at [facility], [place] [address], [place], [place] [address]    Culture PENDING  Incomplete   Report Status PENDING  Incomplete    Procedures and diagnostic studies:  No results found.  Medications:   . apixaban  5 mg Oral BID  . aspirin EC  81 mg Oral Daily  . atorvastatin  80 mg Oral Daily  . furosemide  40 mg Oral Daily  . ipratropium-albuterol  3 mL Nebulization TID  . lidocaine  1 patch Transdermal Q24H  . mouth rinse  15 mL Mouth Rinse BID  . metoprolol tartrate  25 mg Oral BID    Continuous Infusions:    LOS: 2 days   Geradine Girt  [facility]   *Please refer to [email or web address], password [email or web address] to get updated schedule on who will round on this patient, as hospitalists switch teams weekly. If 7PM-7AM, please contact night-coverage at [email or web address], password [email or web address] for any overnight needs.  03/29/2018, 12:48 PM

## 2018-03-29 NOTE — Progress Notes (Signed)
Physical Therapy Treatment Patient Details Name: Craig [name] MRN: [id number] DOB: 1951/03/14 Today's Date: 03/29/2018    History of Present Illness Craig [name] is an 67 y.o. male with a Past Medical History of combined chronic heart failure; remote tobacco dependence with severe COPD on chronic 2L home O2; recurrent B effusions s/p PleurX catheters (removed in 11/17); afib; HTN; CAD s/p CABG; and HLD who presents with acute on chronic respiratory failure.  Patient reports several days of worsening SOB with exertion.    PT Comments    Pt showing improved activity tolerance during today's session as he was able to transfer w/o DOE. Pt stood EOB for ~3 min while speaking with PTA. He ambulated 10 ft in room with min guard before reporting he felt weak and needed to sit. Plan to progress activity tolerance and gait in next session.     Follow Up Recommendations  Home health PT(Noted plan is for home with hospice; if HHPT/OT is included with Hospices services, I believe he could benefit)     Equipment Recommendations  Other (comment)(4 wheeled RW unless he already has one)    Recommendations for Other Services OT consult(ordered per protocol)     Precautions / Restrictions Precautions Precautions: Other (comment) Precaution Comments: Significant DOE Restrictions Weight Bearing Restrictions: No    Mobility  Bed Mobility Overal bed mobility: Needs Assistance Bed Mobility: Supine to Sit     Supine to sit: Supervision     General bed mobility comments: Supervision for safety  Transfers Overall transfer level: Needs assistance Equipment used: None Transfers: Sit to/from Stand Sit to Stand: Min guard         General transfer comment: Good power up. Min guard for safety. No DOE noted.  Ambulation/Gait Ambulation/Gait assistance: Min guard Ambulation Distance (Feet): 10 Feet   Gait Pattern/deviations: Step-through pattern;Decreased stride length     General  Gait Details: Pt able to take few steps in room with min guard for safety. No DOE noted and SpO2 remained >93% during activity on supplamental O2. Pt reported feeling weak after 10 ft and required seated break.   Stairs             Wheelchair Mobility    Modified Rankin (Stroke Patients Only)       Balance Overall balance assessment: Mild deficits observed, not formally tested                                          Cognition Arousal/Alertness: Awake/alert Behavior During Therapy: WFL for tasks assessed/performed Overall Cognitive Status: Within Functional Limits for tasks assessed                                        Exercises      General Comments        Pertinent Vitals/Pain Pain Assessment: No/denies pain    Home Living                      Prior Function            PT Goals (current goals can now be found in the care plan section) Acute Rehab PT Goals Patient Stated Goal: Hopes to get home soon PT Goal Formulation: With patient Time For Goal Achievement: 04/11/18 Potential to  Achieve Goals: Good Progress towards PT goals: Progressing toward goals    Frequency    Min 3X/week      PT Plan Current plan remains appropriate    Co-evaluation              AM-PAC PT "6 Clicks" Daily Activity  Outcome Measure  Difficulty turning over in bed (including adjusting bedclothes, sheets and blankets)?: A Little Difficulty moving from lying on back to sitting on the side of the bed? : A Little Difficulty sitting down on and standing up from a chair with arms (e.g., wheelchair, bedside commode, etc,.)?: A Little Help needed moving to and from a bed to chair (including a wheelchair)?: A Little Help needed walking in hospital room?: A Little Help needed climbing 3-5 steps with a railing? : A Lot 6 Click Score: 17    End of Session Equipment Utilized During Treatment: Oxygen Activity Tolerance: Patient  tolerated treatment well;Patient limited by fatigue Patient left: with call bell/phone within reach;in chair Nurse Communication: Mobility status PT Visit Diagnosis: Other abnormalities of gait and mobility (R26.89);Other (comment)(Decr functional capacity)     Time: 0454-0981 PT Time Calculation (min) (ACUTE ONLY): 21 min  Charges:  $Therapeutic Activity: 8-22 mins                    G Codes:       Kallie Locks, [place] Pager [phone] Acute Rehab   Sheral Apley 03/29/2018, 11:21 AM

## 2018-03-29 NOTE — Progress Notes (Signed)
Occupational Therapy Evaluation Patient Details Name: Craig [name] MRN: [id number] DOB: Feb 04, 1951 Today's Date: 03/29/2018    History of Present Illness Craig [name] is an 67 y.o. male with a Past Medical History of combined chronic heart failure; remote tobacco dependence with severe COPD on chronic 2L home O2; recurrent B effusions s/p PleurX catheters (removed in 11/17); afib; HTN; CAD s/p CABG; and HLD who presents with acute on chronic respiratory failure.  Patient reports several days of worsening SOB with exertion.   Clinical Impression   PTA, pt lived alone and was independent with ADL and mobility. Pt is currently able to complete mobility and ADL with exertion. Began educating pt on use of energy conservation strategies and DME to maximize his ability to complete ADL and IADL tasks. Pt appears to demonstrate muscular pain with apparent trigger points in upper trap. Began education on scapular strengthening/positioning and upper trap stretches. Will plan to see pt in am prior to DC to continue with education to facilitate safe DC home. Strongly recommend follow up HHOT.     Follow Up Recommendations  Home health OT;Supervision - Intermittent    Equipment Recommendations  None recommended by OT    Recommendations for Other Services       Precautions / Restrictions Precautions Precautions: Other (comment) Precaution Comments: Significant DOE Restrictions Weight Bearing Restrictions: No      Mobility Bed Mobility Overal bed mobility: Modified Independent                Transfers Overall transfer level: Modified independent                    Balance                                           ADL either performed or assessed with clinical judgement   ADL Overall ADL's : Needs assistance/impaired                                     Functional mobility during ADLs: Modified independent General ADL Comments: Pt  able to complete ADL tasks however complains of being "exhausted", which is new for him over the past 2 weeks. Pt described his home set up and the method he uses to complete ADL. PT staes he stands "as long as he can until he can't any longer, then sits". Discussed difficulty with meal prep due to standing so long. Pt has a rollator at home but does not use it. Began educating pt on energy conservation with use of rollator for ADL and IADL tasks.      Vision         Perception     Praxis      Pertinent Vitals/Pain Pain Assessment: Faces Faces Pain Scale: Hurts a little bit Pain Location: R upper trap Pain Descriptors / Indicators: Burning;Discomfort Pain Intervention(s): Limited activity within patient's tolerance     Hand Dominance Right   Extremity/Trunk Assessment Upper Extremity Assessment Upper Extremity Assessment: Generalized weakness;RUE deficits/detail RUE Deficits / Details: forward head with kyphosis; scapulae protracted; apparent trigger oints throughout upper trap   Lower Extremity Assessment Lower Extremity Assessment: Defer to PT evaluation   Cervical / Trunk Assessment Cervical / Trunk Assessment: Kyphotic   Communication Communication Communication: No difficulties(though get SOB  VERY easily)   Cognition Arousal/Alertness: Awake/alert Behavior During Therapy: WFL for tasks assessed/performed Overall Cognitive Status: Within Functional Limits for tasks assessed                                     General Comments       Exercises Exercises: Other exercises Other Exercises Other Exercises: RUE trap stretches Other Exercises: scapular retraction/depression   Shoulder Instructions      Home Living Family/patient expects to be discharged to:: Private residence Living Arrangements: Alone Available Help at Discharge: Family;Friend(s);Available PRN/intermittently Type of Home: House Home Access: Stairs to enter [employer] of  Steps: 1   Home Layout: One level     Bathroom Shower/Tub: Producer, television/film/video: Standard Bathroom Accessibility: Yes How Accessible: Accessible via walker Home Equipment: Walker - 4 wheels;Cane - single point;Bedside commode(supplemental O2)          Prior Functioning/Environment Level of Independence: Independent with assistive device(s)  Gait / Transfers Assistance Needed: typically no assistive device, but cane, RW prn ADL's / Homemaking Assistance Needed: sponge bathes, tells me he is exhausted after that; he goes to his sister's home about once a month for a tub bath; tells me he has a walk-in shower that can't fit a shower seat   Comments: sister works full time and can possibly stop by 1-2x/wk        OT Problem List: Decreased activity tolerance;Decreased knowledge of use of DME or AE;Cardiopulmonary status limiting activity;Pain      OT Treatment/Interventions: Self-care/ADL training;Energy conservation;DME and/or AE instruction;Therapeutic activities;Patient/family education    OT Goals(Current goals can be found in the care plan section) Acute Rehab OT Goals Patient Stated Goal: Hopes to get home soon OT Goal Formulation: With patient Time For Goal Achievement: 04/12/18 Potential to Achieve Goals: Good  OT Frequency: Min 2X/week   Barriers to D/C:            Co-evaluation              AM-PAC PT "6 Clicks" Daily Activity     Outcome Measure Help from another person eating meals?: None Help from another person taking care of personal grooming?: None Help from another person toileting, which includes using toliet, bedpan, or urinal?: A Little Help from another person bathing (including washing, rinsing, drying)?: A Little Help from another person to put on and taking off regular upper body clothing?: None Help from another person to put on and taking off regular lower body clothing?: A Little 6 Click Score: 21   End of Session Equipment  Utilized During Treatment: Oxygen Nurse Communication: Mobility status  Activity Tolerance: Patient tolerated treatment well Patient left: in bed;with call bell/phone within reach  OT Visit Diagnosis: Muscle weakness (generalized) (M62.81)                Time: 1478-2956 OT Time Calculation (min): 25 min Charges:  OT General Charges $OT Visit: 1 Visit OT Evaluation $OT Eval Low Complexity: 1 Low OT Treatments $Self Care/Home Management : 8-22 mins G-Codes:     Craig [name], OT/L  OT Clinical Specialist [phone]   [facility] 03/29/2018, 4:26 PM

## 2018-03-30 DIAGNOSIS — J9621 Acute and chronic respiratory failure with hypoxia: Secondary | ICD-10-CM

## 2018-03-30 DIAGNOSIS — J9601 Acute respiratory failure with hypoxia: Secondary | ICD-10-CM

## 2018-03-30 DIAGNOSIS — M25511 Pain in right shoulder: Secondary | ICD-10-CM

## 2018-03-30 LAB — BASIC METABOLIC PANEL
Anion gap: 7 (ref 5–15)
BUN: 35 mg/dL — AB (ref 6–20)
CHLORIDE: 98 mmol/L — AB (ref 101–111)
CO2: 30 mmol/L (ref 22–32)
CREATININE: 1.27 mg/dL — AB (ref 0.61–1.24)
Calcium: 8.3 mg/dL — ABNORMAL LOW (ref 8.9–10.3)
GFR calc Af Amer: >60 mL/min (ref >60)
GFR calc non Af Amer: 57 mL/min — ABNORMAL LOW (ref >60)
Glucose, Bld: 111 mg/dL — ABNORMAL HIGH (ref 65–99)
Potassium: 4.4 mmol/L (ref 3.5–5.1)
SODIUM: 135 mmol/L (ref 135–145)

## 2018-03-30 MED ORDER — IPRATROPIUM-ALBUTEROL 0.5-2.5 (3) MG/3ML IN SOLN: 3.0000 mL | Freq: Two times a day (BID) | RESPIRATORY_TRACT | 0 refills | Status: AC

## 2018-03-30 MED ORDER — IPRATROPIUM-ALBUTEROL 0.5-2.5 (3) MG/3ML IN SOLN
3.0000 mL | Freq: Two times a day (BID) | RESPIRATORY_TRACT | Status: DC
  Administered 2018-03-30 – 2018-03-31 (×2): 3 mL via RESPIRATORY_TRACT
  Filled 2018-03-30 (×2): qty 3

## 2018-03-30 NOTE — Progress Notes (Signed)
Physical Therapy Treatment Patient Details Name: DENNIS HEGEMAN MRN: [id number] DOB: 1951/01/25 Today's Date: 03/30/2018    History of Present Illness ELVAN EBRON is an 67 y.o. male with a Past Medical History of combined chronic heart failure; remote tobacco dependence with severe COPD on chronic 2L home O2; recurrent B effusions s/p PleurX catheters (removed in 11/17); afib; HTN; CAD s/p CABG; and HLD who presents with acute on chronic respiratory failure.  Patient reports several days of worsening SOB with exertion.    PT Comments    Patient ambulated without an AD with supervision; mod independent for bed mobility and transfers. Pt admitted with above diagnosis. Pt currently with functional limitations due to the deficits listed below (see PT Problem List).  Pt will benefit from skilled PT to increase their independence and safety with mobility to allow discharge to the venue listed below.    SATURATION QUALIFICATIONS: (This note is used to comply with regulatory documentation for home oxygen)  Patient Saturations on 2L O2 at Rest = 90%  Patient Saturations on 2L O2 while Ambulating = 85%; unable to recover to 90% within 5 minutes on 2L O2  Patient Saturations on 3 Liters of oxygen while Ambulating = 81%; recovered to 91% in < 1 min on 3L O2  Please briefly explain why patient needs home oxygen: Patient activity level is quite diminished and it takes him >5 minutes to recover on 2L O2. Patient would not be able to live at home without supplemental O2 at 3L O2 or below.   Follow Up Recommendations  Home health PT     Equipment Recommendations       Recommendations for Other Services       Precautions / Restrictions Precautions Precautions: Other (comment) Precaution Comments: Significant DOE Restrictions Weight Bearing Restrictions: No    Mobility  Bed Mobility Overal bed mobility: Modified Independent                Transfers Overall transfer level:  Modified independent Equipment used: None                Ambulation/Gait   Ambulation Distance (Feet): 50 Feet(20 ft on 2L O2; 30 ft on 3L O2.)   Gait Pattern/deviations: Step-through pattern;Decreased stride length Gait velocity: decreased   General Gait Details: DOE with transitions, talking, and ambulation   Social research officer, government Rankin (Stroke Patients Only)       Balance                                            Cognition Arousal/Alertness: Awake/alert Behavior During Therapy: WFL for tasks assessed/performed Overall Cognitive Status: Within Functional Limits for tasks assessed                                        Exercises      General Comments General comments (skin integrity, edema, etc.): Session begun with 2L O2 and desat to 85% ambulating 20 ft w/o AD; >5 min recovering to 88%; increased O2 to 3L - recovered to 91%; amb 30 ft on 3L desating to 81%; recovered to 91% in < 1 min.      Pertinent Vitals/Pain Faces Pain  Scale: Hurts even more Pain Location: R upper trap Pain Descriptors / Indicators: Burning;Discomfort Pain Intervention(s): RN gave pain meds during session;Limited activity within patient's tolerance    Home Living                      Prior Function            PT Goals (current goals can now be found in the care plan section) Progress towards PT goals: Progressing toward goals    Frequency    Min 3X/week      PT Plan Current plan remains appropriate    Co-evaluation              AM-PAC PT "6 Clicks" Daily Activity  Outcome Measure  Difficulty turning over in bed (including adjusting bedclothes, sheets and blankets)?: None Difficulty moving from lying on back to sitting on the side of the bed? : None Difficulty sitting down on and standing up from a chair with arms (e.g., wheelchair, bedside commode, etc,.)?: A Little Help needed  moving to and from a bed to chair (including a wheelchair)?: A Little Help needed walking in hospital room?: A Little Help needed climbing 3-5 steps with a railing? : A Little 6 Click Score: 20    End of Session Equipment Utilized During Treatment: Oxygen Activity Tolerance: Patient tolerated treatment well;Patient limited by fatigue Patient left: with call bell/phone within reach;in chair Nurse Communication: Mobility status PT Visit Diagnosis: Other abnormalities of gait and mobility (R26.89);Other (comment)     Time: 1140-1210 PT Time Calculation (min) (ACUTE ONLY): 30 min  Charges:  $Gait Training: 23-37 mins                    G Codes:       Moneisha Vosler D. Hartnett-Rands, MS, PT [facility] PT [facility] #[id number] 03/30/2018, 12:24 PM

## 2018-03-30 NOTE — Progress Notes (Signed)
Pt ordered equipment just arrived. Pt states he does not have transportation at this time and would feel safer going home in the morning when his family member can come and pick him up first thing in AM. Spoke with Gilman Buttner from hospice who said he would follow-up with RN in the AM for D/C.

## 2018-03-30 NOTE — Care Management Important Message (Signed)
Important Message  Patient Details  Name: [place] WENZLICK MRN: [id number] Date of Birth: 1951/08/23   Medicare Important Message Given:  Yes    Esmeralda Malay P Geffrey Michaelsen 03/30/2018, 3:35 PM

## 2018-03-30 NOTE — Progress Notes (Signed)
Daily Progress Note   Patient Name: Craig [name]       Date: 03/30/2018 DOB: [birth year]  Age: 67 y.o. MRN#: [id number] Attending Physician: Darlin Drop, DO Primary Care Physician: Teodoro Kil Admit Date: 03/26/2018  Reason for Consultation/Follow-up: Establishing goals of care  Subjective: Patient awake, alert, oriented. Hoping to go home soon. Mild right shoulder pain relieved by norco. Dyspnea with ambulating to bathroom.   GOC:  Spoke with sister, [name] via telephone. She confirmed plan for hospice services at home on discharge. She is hopeful to keep him home with support of hospice services and symptom management. Encouraged her again to discuss MOST form with [name] and hospice provider when is able to meet with them in person.   Answered questions/concerns.   Length of Stay: 3  Current Medications: Scheduled Meds:  . apixaban  5 mg Oral BID  . aspirin EC  81 mg Oral Daily  . atorvastatin  80 mg Oral Daily  . furosemide  40 mg Oral Daily  . ipratropium-albuterol  3 mL Nebulization BID  . lidocaine  1 patch Transdermal Q24H  . mouth rinse  15 mL Mouth Rinse BID  . metoprolol tartrate  25 mg Oral BID    Continuous Infusions:  PRN Meds: acetaminophen **OR** acetaminophen, albuterol, bisacodyl, HYDROcodone-acetaminophen, nitroGLYCERIN, ondansetron **OR** ondansetron (ZOFRAN) IV, pneumococcal 23 valent vaccine, senna-docusate  Physical Exam  Constitutional: He is oriented to person, place, and time. He is cooperative. He appears ill.  C/o right shoulder pain  HENT:  Head: Normocephalic and atraumatic.  Pulmonary/Chest: No accessory muscle usage. No tachypnea. No respiratory distress.  Intermittent dyspnea at rest  Neurological: He is alert and oriented  to person, place, and time.  Skin: Skin is warm and dry.  Psychiatric: He has a normal mood and affect. His speech is normal and behavior is normal. Cognition and memory are normal.  Nursing note and vitals reviewed.          Vital Signs: BP 102/74 (BP Location: Left Arm)   Pulse 74   Temp (!) 97.5 F (36.4 C) (Oral)   Resp 18   Ht  (1.753 m)   Wt 58.7 kg (129 lb 6.6 oz)   SpO2 97%   BMI 19.11 kg/m  SpO2: SpO2: 97 % O2 Device: O2  Device: Nasal Cannula O2 Flow Rate: O2 Flow Rate (L/min): 2 L/min  Intake/output summary:   Intake/Output Summary (Last 24 hours) at 03/30/2018 1012 Last data filed at 03/29/2018 2101 Gross per 24 hour  Intake -  Output 270 ml  Net -270 ml   LBM: Last BM Date: 03/27/18 Baseline Weight: Weight: 58.4 kg (128 lb 12 oz) Most recent weight: Weight: 58.7 kg (129 lb 6.6 oz)       Palliative Assessment/Data: PPS 50%   Flowsheet Rows     Most Recent Value  Intake Tab  Referral Department  Hospitalist  Unit at Time of Referral  Med/Surg Unit  Palliative Care Primary Diagnosis  Pulmonary  Date Notified  03/27/18  Palliative Care Type  New Palliative care  Reason for referral  Clarify Goals of Care  Date of Admission  03/26/18  Date first seen by Palliative Care  03/27/18  # of days Palliative referral response time  0 Day(s)  # of days IP prior to Palliative referral  1  Clinical Assessment  Palliative Performance Scale Score  50%  Psychosocial & Spiritual Assessment  Palliative Care Outcomes  Patient/Family meeting held?  Yes  Who was at the meeting?  patient and sister via telephone  Palliative Care Outcomes  Clarified goals of care, Provided end of life care assistance, ACP counseling assistance, Provided psychosocial or spiritual support, Counseled regarding hospice      Patient Active Problem List   Diagnosis Date Noted  . Palliative care by specialist   . Acute pain of right shoulder   . Acute respiratory failure with hypoxia  (HCC) 03/27/2018  . Acute on chronic respiratory failure with hypoxia (HCC) 03/26/2018  . Chronic respiratory failure with hypoxia (HCC) 03/20/2017  . Hemoptysis 03/20/2017  . Dyspnea 05/06/2016  . Goals of care, counseling/discussion 02/15/2016  . Supplemental oxygen dependent   . Peripheral edema   . Hyperkalemia   . Hypoalbuminemia due to protein-calorie malnutrition (HCC)   . Debility 02/03/2016  . History of sepsis   . History of non-ST elevation myocardial infarction (NSTEMI)   . Coronary artery disease involving coronary bypass graft of native heart without angina pectoris   . History of aortic aneurysm repair   . COPD, severe (HCC)   . Leukocytosis   . Hyponatremia   . Thrombocytopenia (HCC)   . Acute blood loss anemia   . Sepsis (HCC)   . S/P CABG (coronary artery bypass graft)   . Paroxysmal atrial fibrillation (HCC)   . S/P CABG x 4   . Pleural effusion, bilateral   . Severe aortic stenosis   . Coronary artery disease involving native coronary artery of native heart without angina pectoris   . Coronary artery disease involving native coronary artery of native heart with unstable angina pectoris (HCC)   . CAD (coronary artery disease)   . Aortic stenosis   . History of hypertension   . History of hyperlipidemia   . Ascending aortic aneurysm (HCC)   . COPD (chronic obstructive pulmonary disease) (HCC) 12/26/2015  . Hypoxia 12/26/2015  . COPD exacerbation (HCC) 02/17/2015  . Ruptured lumbar disc 02/17/2015  . Back pain 02/17/2015    Palliative Care Assessment & Plan   Patient Profile: 67 y.o. male  with past medical history of COPD on home oxygen, CHF, CAD, CABG, recurrent pleural effusions s/p pleurodesis and pleurx catheters (removed 09/2016), afib, HTN, HLD, aoritic stenosis, and smoker admitted on 03/26/2018 with shortness of breath and cough. In ED, chest xray  revealed atelectasis versus pneumonia with small bilateral pleural effusions. BNP 1084. Acute on  chronic respiratory failure secondary to CHF exacerbation and with underlying COPD. Receiving nebs and lasix BID. Palliative medicine consultation for goals of care.   Assessment: Acute on chronic respiratory failure with hypoxia CHF exacerbation Severe COPD on home oxygen Bilateral pleural effusions CAD s/p CABG Dyspnea  Recommendations/Plan:  Plan is for home with hospice services on discharge.  Patient does NOT want to be intubated. He has not yet made a decision regarding resuscitation. MOST form and Hard Choices copy left at bedside for sister. Encouraged patient/sister to continue conversations regarding heroic measures at EOL with underlying progressive COPD and CHF. Will need hospice provider to discuss and complete MOST form when sister is available to meet.   Norco prn pain. Lidoderm patch to right shoulder.   May benefit from home nebulizer machine and/or low-dose Roxanol for dyspnea at home.  Code Status: FULL   Code Status Orders  (From admission, onward)        Start     Ordered   03/26/18 1555  Full code  Continuous     03/26/18 1557    Code Status History    Date Active Date Inactive Code Status Order ID Comments User Context   05/06/2016 1057 05/12/2016 1754 Full Code [id number]  Kathleene Hazel, MD Inpatient   02/03/2016 1944 02/12/2016 1955 Full Code [id number]  Jerene Pitch Inpatient   12/27/2015 0057 02/03/2016 1943 Full Code [id number]  Ron Parker, MD Inpatient    Advance Directive Documentation     Most Recent Value  Type of Advance Directive  -- [pt is unsure which he has]  Pre-existing out of facility DNR order (yellow form or pink MOST form)  -  "MOST" Form in Place?  -       Prognosis:   Unable to determine guarded with acute on chronic respiratory failure secondary to progressive COPD and underlying heart failure. Functional status decline. Need for symptom management with hospice services.   Discharge Planning:  Home with  Hospice  Care plan was discussed with patient, sister [name])  Thank you for allowing the Palliative Medicine Team to assist in the care of this patient.   Time In: 1010 Time Out: 1040 Total Time Prolonged Time Billed no       Greater than 50%  of this time was spent counseling and coordinating care related to the above assessment and plan.  Vennie Homans, FNP-C Palliative Medicine Team  Phone: [phone] Fax: [phone]  Please contact Palliative Medicine Team phone at [phone] for questions and concerns.

## 2018-03-30 NOTE — Progress Notes (Signed)
[facility] Liaison: RN visit  Notified by Gae Gallop, [facility] of patient/family request for [facility] services at home after discharge. Chart and patient information reviewed by [facility] physician. Hospice eligibility approved by Dr. Jamie Brookes.  Spoke with patient at beside to update on discharge and additional DME needed in home. Patient currently has O2, [employer] oxygen, walker, cane, toilet lift seat. He will need a nebulizer and this has been ordered from [facility] to be delivered to the hospital room prior to discharge. He has a family member who is bringing his [employer] oxygen concentrator to the hospital for transport home at discharge.  Patient currently remains a full code. Patient will need prescriptions for comfort medications.     [facility] aware of the above. Please notify [facility] when patient is ready to leave the unit at discharge. (Call [phone] or [phone] after 5pm.) [facility] information and contact numbers given to patient at time of visit. Above information shared with Gae Gallop, CMRN.  Please call with any hospice related questions.  Thank you for this referral.  Elsie Saas, RN, [facility] Liaison [phone] ? [facility] liaisons are now on AMION.

## 2018-03-30 NOTE — Progress Notes (Signed)
Occupational Therapy Treatment Patient Details Name: Craig [name] MRN: [id number] DOB: Oct 24, 1951 Today's Date: 03/30/2018    History of present illness Craig [name] is an 67 y.o. male with a Past Medical History of combined chronic heart failure; remote tobacco dependence with severe COPD on chronic 2L home O2; recurrent B effusions s/p PleurX catheters (removed in 11/17); afib; HTN; CAD s/p CABG; and HLD who presents with acute on chronic respiratory failure.  Patient reports several days of worsening SOB with exertion.   OT comments  Completed education regarding energy conservation and use of DME/AE for activity modification to increase independence and decrease fatigue during ADL tasks. Pt verbalized his concern about his gradual decline and not wanting to be a "burden" on his family. Strongly recommend continued HHOT after DC.   Follow Up Recommendations  Home health OT;Supervision - Intermittent    Equipment Recommendations  None recommended by OT    Recommendations for Other Services      Precautions / Restrictions Precautions Precautions: Other (comment) Precaution Comments: Significant DOE Restrictions Weight Bearing Restrictions: No       Mobility Bed Mobility Overal bed mobility: Modified Independent                Transfers Overall transfer level: Modified independent Equipment used: None                  Balance                                           ADL either performed or assessed with clinical judgement   ADL                                         General ADL Comments: Pt given handout onenergy conservation. Eduated on strategies for home management as support will be limited. Encouraged pt to call on his friend who has said that he wants to help Craig [name] out. Emphasized need to use his rollaotr as mobile chair. PT verbalized understanding.      Vision       Perception     Praxis       Cognition Arousal/Alertness: Awake/alert Behavior During Therapy: WFL for tasks assessed/performed Overall Cognitive Status: Within Functional Limits for tasks assessed                                          Exercises     Shoulder Instructions       General Comments     Pertinent Vitals/ Pain       Pain Assessment: Faces Faces Pain Scale: Hurts a little bit Pain Location: R upper trap Pain Descriptors / Indicators: Burning;Discomfort Pain Intervention(s): Limited activity within patient's tolerance  Home Living                                          Prior Functioning/Environment              Frequency  Min 2X/week        Progress Toward Goals  OT Goals(current goals can now  be found in the care plan section)  Progress towards OT goals: Progressing toward goals;Goals met/education completed, patient discharged from OT  Acute Rehab OT Goals Patient Stated Goal: Hopes to get home soon OT Goal Formulation: With patient Time For Goal Achievement: 04/12/18 Potential to Achieve Goals: Good ADL Goals Additional ADL Goal #1: Pt will independently verbalize 3 energy conservation strategies Additional ADL Goal #2: Pt will independently verbalize 3 strategies to reduce risk of falls  Plan Discharge plan remains appropriate    Co-evaluation                 AM-PAC PT "6 Clicks" Daily Activity     Outcome Measure   Help from another person eating meals?: None Help from another person taking care of personal grooming?: None Help from another person toileting, which includes using toliet, bedpan, or urinal?: None Help from another person bathing (including washing, rinsing, drying)?: None Help from another person to put on and taking off regular upper body clothing?: None Help from another person to put on and taking off regular lower body clothing?: None 6 Click Score: 24    End of Session Equipment Utilized During  Treatment: Oxygen  OT Visit Diagnosis: Muscle weakness (generalized) (M62.81)   Activity Tolerance Patient tolerated treatment well   Patient Left in chair;with call bell/phone within reach   Nurse Communication Mobility status        Time: 6381-7711 OT Time Calculation (min): 19 min  Charges: OT General Charges $OT Visit: 1 Visit OT Treatments $Self Care/Home Management : 8-22 mins  Maurie Boettcher, OT/L  OT Clinical Specialist [phone]    [facility] 03/30/2018, 2:51 PM

## 2018-03-30 NOTE — Discharge Summary (Addendum)
Discharge Summary  Craig [name] BPZ:[id number] DOB: 1950/11/17  PCP: Ysidro Evert, PA-C  Admit date: 03/26/2018 Discharge date: 03/30/2018  Time spent: 25 minutes  Recommendations for Outpatient Follow-up:  1. Follow-up with palliative care/patient requests hospice 2. Follow-up with PCP 3. Take your medications as prescribed  Discharge Diagnoses:  Active Hospital Problems   Diagnosis Date Noted  . Acute on chronic respiratory failure with hypoxia ([place]) 03/26/2018  . Palliative care by specialist   . Acute pain of right shoulder   . Acute respiratory failure with hypoxia ([place]) 03/27/2018  . Goals of care, counseling/discussion 02/15/2016  . COPD, severe ([place])   . S/P CABG (coronary artery bypass graft)   . Paroxysmal atrial fibrillation (HCC)   . Pleural effusion, bilateral   . CAD (coronary artery disease)   . History of hypertension   . History of hyperlipidemia   . COPD exacerbation ([place]) 02/17/2015    Resolved Hospital Problems  No resolved problems to display.    Discharge Condition: Stable  Diet recommendation: Resume previous diet  Vitals:   03/30/18 0605 03/30/18 0858  BP: 102/74   Pulse: 74   Resp: 18   Temp: (!) 97.5 F (36.4 C)   SpO2: 99% 97%    History of present illness:  Craig [name] is an 67 y.o. male with a Past Medical History of combined chronic heart failure; remote tobacco dependence with severe COPD on chronic 2L home O2; recurrent B effusions s/p PleurX catheters (removed in 11/17); afib; HTN; CAD s/p CABG; and HLD who presents with acute on chronic respiratory failure. Patient reports several days of worsening SOB with exertion.  Responded well to IV diuresis but still SOB.  Family met with palliative care and plan is home with hospice in AM.  Will need nebulizer and nebs.  03/30/2018: Patient seen and examined at his bedside.  He reports his breathing is improved.  He has no new complaints.    On the day of discharge  patient was hemodynamically stable.  He is followed by palliative care team.  He has requested hospice.  Hospital Course:  Principal Problem:   Acute on chronic respiratory failure with hypoxia (HCC) Active Problems:   COPD exacerbation (HCC)   History of hypertension   History of hyperlipidemia   CAD (coronary artery disease)   Pleural effusion, bilateral   S/P CABG (coronary artery bypass graft)   Paroxysmal atrial fibrillation (HCC)   COPD, severe (HCC)   Goals of care, counseling/discussion   Acute respiratory failure with hypoxia ([place])   Palliative care by specialist   Acute pain of right shoulder  Acute on chronic respiratory failure -wears 2L O2 at home -has been weaned to home O2  Acute exacerbation of systolic as well as diastolic heart failure -responding well to IV lasix- down 2.5L-- changed to PO lasix and will need BMP in the AM for monitoring -repeat echo with improved EF from 2017 -ACE d/c'd due to AKI  AKI -suspect due to overdiuresis  COPD -nebs -O2 -does not appear to be acute exacerbation-- hold on steroids -will need nebs upon d/c   parox Atrial fibrillation -on eliquis -increased BB and monitor HR  Elevated Troponinin a patient with history of CAD status post CABG, -no chest pain, likely due to demand ischemia  HTN -monitor BB   Plan is home in AM with hospice      Procedures:  None  Consultations:  Palliative care team  Discharge Exam: BP 102/74 (BP  Location: Left Arm)   Pulse 74   Temp (!) 97.5 F (36.4 C) (Oral)   Resp 18   Ht '5\' 9"'  (1.753 m)   Wt 58.7 kg (129 lb 6.6 oz)   SpO2 97%   BMI 19.11 kg/m  . General: 67 y.o. year-old male well developed well nourished in no acute distress.  Alert and oriented x3. . Cardiovascular: Regular rate and rhythm with no rubs or gallops.  No thyromegaly or JVD noted.   Marland Kitchen Respiratory: Clear to auscultation with no wheezes or rales. Good inspiratory effort. . Abdomen: Soft  nontender nondistended with normal bowel sounds x4 quadrants. . Musculoskeletal: No lower extremity edema. 2/4 pulses in all 4 extremities. . Skin: No ulcerative lesions noted or rashes, . Psychiatry: Mood is appropriate for condition and setting  Discharge Instructions You were cared for by a hospitalist during your hospital stay. If you have any questions about your discharge medications or the care you received while you were in the hospital after you are discharged, you can call the unit and asked to speak with the hospitalist on call if the hospitalist that took care of you is not available. Once you are discharged, your primary care physician will handle any further medical issues. Please note that NO REFILLS for any discharge medications will be authorized once you are discharged, as it is imperative that you return to your primary care physician (or establish a relationship with a primary care physician if you do not have one) for your aftercare needs so that they can reassess your need for medications and monitor your lab values.  Discharge Instructions    DME Nebulizer machine   Complete by:  As directed    Patient needs a nebulizer to treat with the following condition:  COPD (chronic obstructive pulmonary disease) ([place])   For home use only DME 4 wheeled rolling walker with seat   Complete by:  As directed    Patient needs a walker to treat with the following condition:  Ambulatory dysfunction     Allergies as of 03/30/2018      Reactions   No Known Allergies Other (See Comments)      Medication List    TAKE these medications   apixaban 5 MG Tabs tablet Commonly known as:  ELIQUIS Take 1 tablet (5 mg total) by mouth 2 (two) times daily.   aspirin 81 MG EC tablet Take 1 tablet (81 mg total) by mouth daily.   atorvastatin 80 MG tablet Commonly known as:  LIPITOR Take 1 tablet (80 mg total) by mouth daily.   furosemide 40 MG tablet Commonly known as:  LASIX Take 1 tablet  (40 mg total) by mouth daily. What changed:  when to take this   ipratropium-albuterol 0.5-2.5 (3) MG/3ML Soln Commonly known as:  DUONEB Take 3 mLs by nebulization 2 (two) times daily.   metoprolol tartrate 25 MG tablet Commonly known as:  LOPRESSOR Take 0.5 tablets (12.5 mg total) by mouth 2 (two) times daily.   nitroGLYCERIN 0.4 MG SL tablet Commonly known as:  NITROSTAT Place 1 tablet (0.4 mg total) under the tongue every 5 (five) minutes x 3 doses as needed for chest pain.   OXYGEN Inhale 2 L into the lungs continuous.   potassium chloride SA 20 MEQ tablet Commonly known as:  KLOR-CON M20 Take 1 tablet (20 mEq total) by mouth 2 (two) times daily.            Durable Medical Equipment  (  From admission, onward)        Start     Ordered   03/30/18 0000  DME Nebulizer machine    Question:  Patient needs a nebulizer to treat with the following condition  Answer:  COPD (chronic obstructive pulmonary disease) ([place])   03/30/18 1115   03/30/18 0000  For home use only DME 4 wheeled rolling walker with seat    Question:  Patient needs a walker to treat with the following condition  Answer:  Ambulatory dysfunction   03/30/18 1121   03/29/18 0848  For home use only DME Nebulizer machine  Once    Question:  Patient needs a nebulizer to treat with the following condition  Answer:  COPD (chronic obstructive pulmonary disease) ([place])   03/29/18 0847     Allergies  Allergen Reactions  . No Known Allergies Other (See Comments)   Follow-up Information    Ysidro Evert, PA-C. Call in 3 day(s).   Specialty:  Physician Assistant Why:  Please call for appointment. Contact information: [address] [place] [address] [phone]            The results of significant diagnostics from this hospitalization (including imaging, microbiology, ancillary and laboratory) are listed below for reference.    Significant Diagnostic Studies: Dg Chest 2 View  Result Date:  03/26/2018 CLINICAL DATA:  Shortness of breath, weakness x3 days EXAM: CHEST - 2 VIEW COMPARISON:  03/20/2017 FINDINGS: Mild patchy right lower lobe opacity, atelectasis versus pneumonia. Increased interstitial markings.  No frank interstitial edema. Small bilateral pleural effusions.  No pneumothorax. Heart is top-normal in size. Prosthetic aortic valve. Postsurgical changes related to prior CABG. Median sternotomy. IMPRESSION: Mild patchy right lower lobe opacity, atelectasis versus pneumonia. Small bilateral pleural effusions.  No frank interstitial edema. Electronically Signed   By: Julian Hy M.D.   On: 03/26/2018 12:37    Microbiology: Recent Results (from the past 240 hour(s))  Culture, blood (routine x 2) Call MD if unable to obtain prior to antibiotics being given     Status: None (Preliminary result)   Collection Time: 03/26/18  5:40 PM  Result Value Ref Range Status   Specimen Description BLOOD LEFT ANTECUBITAL  Final   Special Requests   Final    BOTTLES DRAWN AEROBIC AND ANAEROBIC Blood Culture adequate volume   Culture   Final    NO GROWTH 3 DAYS Performed at [facility], [address] [place], [place] [address]    Report Status PENDING  Incomplete  Culture, blood (routine x 2) Call MD if unable to obtain prior to antibiotics being given     Status: None (Preliminary result)   Collection Time: 03/26/18  5:50 PM  Result Value Ref Range Status   Specimen Description BLOOD RIGHT ANTECUBITAL  Final   Special Requests   Final    BOTTLES DRAWN AEROBIC AND ANAEROBIC Blood Culture adequate volume   Culture   Final    NO GROWTH 3 DAYS Performed at [facility], [place] [address] [place], [place] [address]    Report Status PENDING  Incomplete  Urine culture     Status: Abnormal   Collection Time: 03/26/18  7:35 PM  Result Value Ref Range Status   Specimen Description URINE, RANDOM  Final   Special Requests NONE  Final   Culture (A)  Final    60,000 COLONIES/mL  CORYNEBACTERIUM SPECIES Standardized susceptibility testing for this organism is not available. Performed at [facility], [place] [address] [place], [place] [address]    Report Status 03/28/2018 FINAL  Final  Culture, sputum-assessment     Status: None   Collection Time: 03/29/18  5:59 AM  Result Value Ref Range Status   Specimen Description EXPECTORATED SPUTUM  Final   Special Requests NONE  Final   Sputum evaluation   Final    THIS SPECIMEN IS ACCEPTABLE FOR SPUTUM CULTURE Performed at [facility], [place] [address], [place], [place] [address]    Report Status 03/29/2018 FINAL  Final  Culture, respiratory (NON-Expectorated)     Status: None (Preliminary result)   Collection Time: 03/29/18  5:59 AM  Result Value Ref Range Status   Specimen Description EXPECTORATED SPUTUM  Final   Special Requests NONE Reflexed from [id number]  Final   Gram Stain   Final    MODERATE WBC PRESENT, PREDOMINANTLY PMN FEW SQUAMOUS EPITHELIAL CELLS PRESENT ABUNDANT GRAM POSITIVE COCCI MODERATE GRAM VARIABLE ROD    Culture   Final    CULTURE REINCUBATED FOR BETTER GROWTH Performed at [facility], [place] [address], [place], [place] [address]    Report Status PENDING  Incomplete     Labs: Basic Metabolic Panel: Recent Labs  Lab 03/26/18 1407 03/27/18 0510 03/28/18 0501 03/29/18 0454 03/30/18 0615  NA 141 142 141 138 135  K 4.2 3.9 4.1 4.3 4.4  CL 106 104 101 100* 98*  CO2 '28 31 31 30 30  ' GLUCOSE 137* 164* 96 106* 111*  BUN 21* 21* 30* 34* 35*  CREATININE 1.01 1.14 1.61* 1.23 1.27*  CALCIUM 9.0 8.8* 8.6* 8.4* 8.3*   Liver Function Tests: No results for input(s): AST, ALT, ALKPHOS, BILITOT, PROT, ALBUMIN in the last 168 hours. No results for input(s): LIPASE, AMYLASE in the last 168 hours. No results for input(s): AMMONIA in the last 168 hours. CBC: Recent Labs  Lab 03/26/18 1407 03/27/18 0510 03/28/18 0501  WBC 4.6 5.2 6.8  HGB 11.7* 10.8* 10.2*  HCT 37.9* 34.0* 33.1*    MCV 102.4* 100.6* 104.1*  PLT 137* 134* 139*   Cardiac Enzymes: Recent Labs  Lab 03/26/18 1733 03/26/18 2204  TROPONINI 0.15* 0.18*   BNP: BNP (last 3 results) Recent Labs    03/26/18 1733  BNP 1,084.8*    ProBNP (last 3 results) No results for input(s): PROBNP in the last 8760 hours.  CBG: No results for input(s): GLUCAP in the last 168 hours.     Signed:  Kayleen Memos, MD [facility] 03/30/2018, 11:21 AM

## 2018-03-31 ENCOUNTER — Other Ambulatory Visit: Payer: Self-pay | Admitting: Cardiovascular Disease

## 2018-03-31 DIAGNOSIS — Z952 Presence of prosthetic heart valve: Secondary | ICD-10-CM

## 2018-03-31 DIAGNOSIS — I480 Paroxysmal atrial fibrillation: Secondary | ICD-10-CM

## 2018-03-31 DIAGNOSIS — I25810 Atherosclerosis of coronary artery bypass graft(s) without angina pectoris: Secondary | ICD-10-CM

## 2018-03-31 LAB — CULTURE, RESPIRATORY W GRAM STAIN

## 2018-03-31 LAB — CULTURE, BLOOD (ROUTINE X 2)
Culture: NO GROWTH
Culture: NO GROWTH
SPECIAL REQUESTS: ADEQUATE
SPECIAL REQUESTS: ADEQUATE

## 2018-03-31 LAB — CULTURE, RESPIRATORY: CULTURE: NORMAL

## 2018-03-31 NOTE — Care Management (Signed)
Spoke with [facility] nurse who states [name]'s admission is scheduled for tomorrow. Pt has all DME.  Chip Boer, bedside RN advised that patient is ready for d/c.

## 2018-03-31 NOTE — Progress Notes (Signed)
Craig [name] to be D/C'd Home per MD order.  Discussed prescriptions and follow up appointments with the patient. Prescriptions given to patient, medication list explained in detail. Pt verbalized understanding.  Allergies as of 03/31/2018      Reactions   No Known Allergies Other (See Comments)      Medication List    TAKE these medications   apixaban 5 MG Tabs tablet Commonly known as:  ELIQUIS Take 1 tablet (5 mg total) by mouth 2 (two) times daily.   aspirin 81 MG EC tablet Take 1 tablet (81 mg total) by mouth daily.   atorvastatin 80 MG tablet Commonly known as:  LIPITOR Take 1 tablet (80 mg total) by mouth daily.   furosemide 40 MG tablet Commonly known as:  LASIX Take 1 tablet (40 mg total) by mouth daily. What changed:  when to take this   ipratropium-albuterol 0.5-2.5 (3) MG/3ML Soln Commonly known as:  DUONEB Take 3 mLs by nebulization 2 (two) times daily.   metoprolol tartrate 25 MG tablet Commonly known as:  LOPRESSOR Take 0.5 tablets (12.5 mg total) by mouth 2 (two) times daily.   nitroGLYCERIN 0.4 MG SL tablet Commonly known as:  NITROSTAT Place 1 tablet (0.4 mg total) under the tongue every 5 (five) minutes x 3 doses as needed for chest pain.   OXYGEN Inhale 2 L into the lungs continuous.   potassium chloride SA 20 MEQ tablet Commonly known as:  KLOR-CON M20 Take 1 tablet (20 mEq total) by mouth 2 (two) times daily.            Durable Medical Equipment  (From admission, onward)        Start     Ordered   03/30/18 0000  DME Nebulizer machine    Question:  Patient needs a nebulizer to treat with the following condition  Answer:  COPD (chronic obstructive pulmonary disease) (HCC)   03/30/18 1115   03/30/18 0000  For home use only DME 4 wheeled rolling walker with seat    Question:  Patient needs a walker to treat with the following condition  Answer:  Ambulatory dysfunction   03/30/18 1121   03/29/18 0848  For home use only DME Nebulizer  machine  Once    Question:  Patient needs a nebulizer to treat with the following condition  Answer:  COPD (chronic obstructive pulmonary disease) (HCC)   03/29/18 0847      Vitals:   03/31/18 0605 03/31/18 0809  BP: 107/64   Pulse: 71 72  Resp: 18 18  Temp: (!) 97.3 F (36.3 C)   SpO2: 95% 96%    Skin clean, dry and intact without evidence of skin break down, no evidence of skin tears noted. IV catheter discontinued intact. Site without signs and symptoms of complications. Dressing and pressure applied. Pt denies pain at this time. No complaints noted.  An After Visit Summary was printed and given to the patient. Patient escorted via WC, and D/C home via private auto.  Fara Boros BSN, RN [employer] [facility] Phone [phone]

## 2018-10-05 ENCOUNTER — Other Ambulatory Visit: Payer: Self-pay | Admitting: Cardiovascular Disease

## 2018-10-07 DEATH — deceased

## 2020-01-10 ENCOUNTER — Encounter: Payer: Self-pay | Admitting: Cardiovascular Disease
